# Patient Record
Sex: Male | Born: 1940 | Race: Black or African American | Hispanic: No | Marital: Married | Smoking: Former smoker
Health system: Southern US, Community
[De-identification: ages and names within clinical notes are randomized; demographics above are authoritative.]

## PROBLEM LIST (undated history)

## (undated) DIAGNOSIS — K219 Gastro-esophageal reflux disease without esophagitis: Secondary | ICD-10-CM

## (undated) DIAGNOSIS — E119 Type 2 diabetes mellitus without complications: Secondary | ICD-10-CM

## (undated) DIAGNOSIS — D649 Anemia, unspecified: Secondary | ICD-10-CM

## (undated) DIAGNOSIS — I8290 Acute embolism and thrombosis of unspecified vein: Secondary | ICD-10-CM

## (undated) DIAGNOSIS — I10 Essential (primary) hypertension: Secondary | ICD-10-CM

## (undated) DIAGNOSIS — J189 Pneumonia, unspecified organism: Secondary | ICD-10-CM

## (undated) DIAGNOSIS — C61 Malignant neoplasm of prostate: Secondary | ICD-10-CM

## (undated) DIAGNOSIS — E785 Hyperlipidemia, unspecified: Secondary | ICD-10-CM

## (undated) DIAGNOSIS — N189 Chronic kidney disease, unspecified: Secondary | ICD-10-CM

## (undated) HISTORY — DX: Hyperlipidemia, unspecified: E78.5

## (undated) HISTORY — PX: OTHER SURGICAL HISTORY: SHX169

## (undated) HISTORY — DX: Essential (primary) hypertension: I10

## (undated) HISTORY — DX: Malignant neoplasm of prostate: C61

---

## 1982-04-09 HISTORY — PX: ESOPHAGEAL MANOMETRY: SHX1526

## 1999-06-13 ENCOUNTER — Other Ambulatory Visit: Admission: RE | Admit: 1999-06-13 | Discharge: 1999-06-13 | Payer: Self-pay | Admitting: Urology

## 1999-06-27 ENCOUNTER — Encounter: Admission: RE | Admit: 1999-06-27 | Discharge: 1999-09-25 | Payer: Self-pay | Admitting: Radiation Oncology

## 2001-05-05 ENCOUNTER — Ambulatory Visit: Admission: RE | Admit: 2001-05-05 | Discharge: 2001-08-03 | Payer: Self-pay | Admitting: Radiation Oncology

## 2001-11-03 ENCOUNTER — Ambulatory Visit: Admission: RE | Admit: 2001-11-03 | Discharge: 2001-11-07 | Payer: Self-pay | Admitting: Radiation Oncology

## 2002-05-11 ENCOUNTER — Ambulatory Visit: Admission: RE | Admit: 2002-05-11 | Discharge: 2002-05-11 | Payer: Self-pay | Admitting: Radiation Oncology

## 2002-11-09 ENCOUNTER — Ambulatory Visit: Admission: RE | Admit: 2002-11-09 | Discharge: 2002-11-09 | Payer: Self-pay | Admitting: Radiation Oncology

## 2003-03-19 ENCOUNTER — Encounter: Admission: RE | Admit: 2003-03-19 | Discharge: 2003-03-19 | Payer: Self-pay | Admitting: Internal Medicine

## 2003-05-17 ENCOUNTER — Ambulatory Visit: Admission: RE | Admit: 2003-05-17 | Discharge: 2003-05-17 | Payer: Self-pay | Admitting: Radiation Oncology

## 2003-11-15 ENCOUNTER — Ambulatory Visit: Admission: RE | Admit: 2003-11-15 | Discharge: 2003-11-15 | Payer: Self-pay | Admitting: Radiation Oncology

## 2003-11-16 ENCOUNTER — Ambulatory Visit: Admission: RE | Admit: 2003-11-16 | Discharge: 2003-11-16 | Payer: Self-pay | Admitting: Radiation Oncology

## 2004-03-01 ENCOUNTER — Ambulatory Visit: Payer: Self-pay | Admitting: Internal Medicine

## 2004-03-09 ENCOUNTER — Ambulatory Visit: Payer: Self-pay | Admitting: Internal Medicine

## 2004-04-09 DIAGNOSIS — C61 Malignant neoplasm of prostate: Secondary | ICD-10-CM

## 2004-04-09 HISTORY — DX: Malignant neoplasm of prostate: C61

## 2004-07-07 ENCOUNTER — Ambulatory Visit: Payer: Self-pay | Admitting: Internal Medicine

## 2004-08-01 ENCOUNTER — Ambulatory Visit: Payer: Self-pay | Admitting: Internal Medicine

## 2004-10-27 ENCOUNTER — Ambulatory Visit: Payer: Self-pay | Admitting: Internal Medicine

## 2004-11-17 ENCOUNTER — Ambulatory Visit: Payer: Self-pay | Admitting: Internal Medicine

## 2005-01-04 ENCOUNTER — Ambulatory Visit (HOSPITAL_COMMUNITY): Admission: RE | Admit: 2005-01-04 | Discharge: 2005-01-04 | Payer: Self-pay | Admitting: Urology

## 2005-02-01 ENCOUNTER — Encounter (INDEPENDENT_AMBULATORY_CARE_PROVIDER_SITE_OTHER): Payer: Self-pay | Admitting: *Deleted

## 2005-02-01 ENCOUNTER — Ambulatory Visit (HOSPITAL_BASED_OUTPATIENT_CLINIC_OR_DEPARTMENT_OTHER): Admission: RE | Admit: 2005-02-01 | Discharge: 2005-02-01 | Payer: Self-pay | Admitting: Urology

## 2005-02-01 ENCOUNTER — Ambulatory Visit (HOSPITAL_COMMUNITY): Admission: RE | Admit: 2005-02-01 | Discharge: 2005-02-01 | Payer: Self-pay | Admitting: Urology

## 2005-02-09 ENCOUNTER — Ambulatory Visit: Payer: Self-pay | Admitting: Internal Medicine

## 2005-02-13 ENCOUNTER — Ambulatory Visit: Payer: Self-pay | Admitting: Internal Medicine

## 2005-04-27 ENCOUNTER — Ambulatory Visit (HOSPITAL_COMMUNITY): Admission: RE | Admit: 2005-04-27 | Discharge: 2005-04-27 | Payer: Self-pay | Admitting: Urology

## 2005-06-22 ENCOUNTER — Ambulatory Visit: Payer: Self-pay | Admitting: Internal Medicine

## 2005-06-29 ENCOUNTER — Ambulatory Visit: Payer: Self-pay | Admitting: Internal Medicine

## 2005-07-20 ENCOUNTER — Ambulatory Visit: Payer: Self-pay | Admitting: Internal Medicine

## 2005-09-17 ENCOUNTER — Ambulatory Visit: Payer: Self-pay | Admitting: Internal Medicine

## 2005-09-24 ENCOUNTER — Ambulatory Visit: Payer: Self-pay | Admitting: Internal Medicine

## 2005-12-13 ENCOUNTER — Ambulatory Visit: Payer: Self-pay | Admitting: Internal Medicine

## 2005-12-17 ENCOUNTER — Ambulatory Visit: Payer: Self-pay | Admitting: Internal Medicine

## 2006-04-09 DIAGNOSIS — E119 Type 2 diabetes mellitus without complications: Secondary | ICD-10-CM

## 2006-04-09 DIAGNOSIS — E785 Hyperlipidemia, unspecified: Secondary | ICD-10-CM

## 2006-04-09 DIAGNOSIS — I10 Essential (primary) hypertension: Secondary | ICD-10-CM

## 2006-04-09 DIAGNOSIS — I8290 Acute embolism and thrombosis of unspecified vein: Secondary | ICD-10-CM

## 2006-04-09 HISTORY — DX: Type 2 diabetes mellitus without complications: E11.9

## 2006-04-09 HISTORY — DX: Acute embolism and thrombosis of unspecified vein: I82.90

## 2006-04-09 HISTORY — DX: Essential (primary) hypertension: I10

## 2006-04-09 HISTORY — DX: Hyperlipidemia, unspecified: E78.5

## 2006-04-15 ENCOUNTER — Ambulatory Visit: Payer: Self-pay | Admitting: Internal Medicine

## 2006-04-15 LAB — CONVERTED CEMR LAB
ALT: 13 units/L (ref 0–40)
AST: 20 units/L (ref 0–37)
Albumin: 3.5 g/dL (ref 3.5–5.2)
Alkaline Phosphatase: 117 units/L (ref 39–117)
BUN: 13 mg/dL (ref 6–23)
Bilirubin, Direct: 0.1 mg/dL (ref 0.0–0.3)
CO2: 28 meq/L (ref 19–32)
Calcium: 9.3 mg/dL (ref 8.4–10.5)
Chloride: 109 meq/L (ref 96–112)
Chol/HDL Ratio, serum: 2.6
Cholesterol: 136 mg/dL (ref 0–200)
Creatinine, Ser: 2 mg/dL — ABNORMAL HIGH (ref 0.4–1.5)
GFR calc non Af Amer: 36 mL/min
Glomerular Filtration Rate, Af Am: 43 mL/min/{1.73_m2}
Glucose, Bld: 147 mg/dL — ABNORMAL HIGH (ref 70–99)
HDL: 52.4 mg/dL (ref >39.0)
Hgb A1c MFr Bld: 7.4 % — ABNORMAL HIGH (ref 4.6–6.0)
LDL Cholesterol: 71 mg/dL (ref 0–99)
Potassium: 4.2 meq/L (ref 3.5–5.1)
Sodium: 143 meq/L (ref 135–145)
Total Bilirubin: 0.7 mg/dL (ref 0.3–1.2)
Total Protein: 6.7 g/dL (ref 6.0–8.3)
Triglyceride fasting, serum: 62 mg/dL (ref 0–149)
VLDL: 12 mg/dL (ref 0–40)

## 2006-04-22 ENCOUNTER — Ambulatory Visit: Payer: Self-pay | Admitting: Internal Medicine

## 2006-08-26 ENCOUNTER — Ambulatory Visit: Payer: Self-pay | Admitting: Internal Medicine

## 2006-08-26 LAB — CONVERTED CEMR LAB
ALT: 11 units/L (ref 0–40)
AST: 22 units/L (ref 0–37)
Albumin: 3.5 g/dL (ref 3.5–5.2)
Alkaline Phosphatase: 81 units/L (ref 39–117)
BUN: 12 mg/dL (ref 6–23)
Bilirubin, Direct: 0.1 mg/dL (ref 0.0–0.3)
CO2: 29 meq/L (ref 19–32)
Calcium: 9.4 mg/dL (ref 8.4–10.5)
Chloride: 114 meq/L — ABNORMAL HIGH (ref 96–112)
Cholesterol: 150 mg/dL (ref 0–200)
Creatinine, Ser: 1.9 mg/dL — ABNORMAL HIGH (ref 0.4–1.5)
GFR calc Af Amer: 46 mL/min
GFR calc non Af Amer: 38 mL/min
Glucose, Bld: 101 mg/dL — ABNORMAL HIGH (ref 70–99)
HDL: 52.6 mg/dL (ref >39.0)
Hgb A1c MFr Bld: 7.7 % — ABNORMAL HIGH (ref 4.6–6.0)
LDL Cholesterol: 79 mg/dL (ref 0–99)
Potassium: 4.3 meq/L (ref 3.5–5.1)
Sodium: 149 meq/L — ABNORMAL HIGH (ref 135–145)
Total Bilirubin: 0.4 mg/dL (ref 0.3–1.2)
Total CHOL/HDL Ratio: 2.9
Total Protein: 6.7 g/dL (ref 6.0–8.3)
Triglycerides: 92 mg/dL (ref 0–149)
VLDL: 18 mg/dL (ref 0–40)

## 2006-09-09 ENCOUNTER — Ambulatory Visit: Payer: Self-pay | Admitting: Internal Medicine

## 2006-09-13 DIAGNOSIS — E785 Hyperlipidemia, unspecified: Secondary | ICD-10-CM

## 2006-09-13 DIAGNOSIS — I10 Essential (primary) hypertension: Secondary | ICD-10-CM

## 2006-09-13 DIAGNOSIS — E1169 Type 2 diabetes mellitus with other specified complication: Secondary | ICD-10-CM

## 2006-09-13 DIAGNOSIS — C61 Malignant neoplasm of prostate: Secondary | ICD-10-CM | POA: Insufficient documentation

## 2006-09-15 DIAGNOSIS — E1129 Type 2 diabetes mellitus with other diabetic kidney complication: Secondary | ICD-10-CM | POA: Insufficient documentation

## 2007-01-09 ENCOUNTER — Ambulatory Visit: Payer: Self-pay | Admitting: Internal Medicine

## 2007-01-09 LAB — CONVERTED CEMR LAB
ALT: 13 units/L (ref 0–53)
AST: 24 units/L (ref 0–37)
Albumin: 3.4 g/dL — ABNORMAL LOW (ref 3.5–5.2)
Alkaline Phosphatase: 109 units/L (ref 39–117)
BUN: 8 mg/dL (ref 6–23)
Bilirubin, Direct: 0.2 mg/dL (ref 0.0–0.3)
CO2: 32 meq/L (ref 19–32)
Calcium: 9 mg/dL (ref 8.4–10.5)
Chloride: 104 meq/L (ref 96–112)
Cholesterol: 146 mg/dL (ref 0–200)
Creatinine, Ser: 1.8 mg/dL — ABNORMAL HIGH (ref 0.4–1.5)
GFR calc Af Amer: 49 mL/min
GFR calc non Af Amer: 40 mL/min
Glucose, Bld: 108 mg/dL — ABNORMAL HIGH (ref 70–99)
HDL: 44.5 mg/dL (ref >39.0)
Hgb A1c MFr Bld: 7.7 % — ABNORMAL HIGH (ref 4.6–6.0)
LDL Cholesterol: 82 mg/dL (ref 0–99)
Potassium: 3.8 meq/L (ref 3.5–5.1)
Sodium: 143 meq/L (ref 135–145)
Total Bilirubin: 0.6 mg/dL (ref 0.3–1.2)
Total CHOL/HDL Ratio: 3.3
Total Protein: 6.4 g/dL (ref 6.0–8.3)
Triglycerides: 96 mg/dL (ref 0–149)
VLDL: 19 mg/dL (ref 0–40)

## 2007-01-17 ENCOUNTER — Ambulatory Visit: Payer: Self-pay | Admitting: Internal Medicine

## 2007-01-17 LAB — CONVERTED CEMR LAB
Cholesterol, target level: 200 mg/dL
HDL goal, serum: 40 mg/dL
LDL Goal: 100 mg/dL

## 2007-02-05 ENCOUNTER — Encounter (HOSPITAL_COMMUNITY): Admission: RE | Admit: 2007-02-05 | Discharge: 2007-04-07 | Payer: Self-pay | Admitting: Urology

## 2007-04-25 ENCOUNTER — Encounter: Payer: Self-pay | Admitting: Internal Medicine

## 2007-05-21 ENCOUNTER — Ambulatory Visit: Payer: Self-pay | Admitting: Internal Medicine

## 2007-05-28 LAB — CONVERTED CEMR LAB
Alkaline Phosphatase: 97 units/L (ref 39–117)
CO2: 29 meq/L (ref 19–32)
Chloride: 101 meq/L (ref 96–112)
GFR calc Af Amer: 43 mL/min
GFR calc non Af Amer: 36 mL/min
Glucose, Bld: 246 mg/dL — ABNORMAL HIGH (ref 70–99)
HDL: 46.4 mg/dL (ref >39.0)
LDL Cholesterol: 81 mg/dL (ref 0–99)
Potassium: 3.6 meq/L (ref 3.5–5.1)
Sodium: 141 meq/L (ref 135–145)
Triglycerides: 89 mg/dL (ref 0–149)
VLDL: 18 mg/dL (ref 0–40)

## 2007-09-18 ENCOUNTER — Ambulatory Visit: Payer: Self-pay | Admitting: Internal Medicine

## 2007-09-22 LAB — CONVERTED CEMR LAB
ALT: 9 units/L (ref 0–53)
CO2: 30 meq/L (ref 19–32)
Calcium: 10.1 mg/dL (ref 8.4–10.5)
Chloride: 103 meq/L (ref 96–112)
Cholesterol: 136 mg/dL (ref 0–200)
GFR calc non Af Amer: 43 mL/min
HDL: 50.1 mg/dL (ref >39.0)
Potassium: 3.1 meq/L — ABNORMAL LOW (ref 3.5–5.1)
Sodium: 142 meq/L (ref 135–145)
Total CHOL/HDL Ratio: 2.7

## 2007-11-11 ENCOUNTER — Emergency Department (HOSPITAL_BASED_OUTPATIENT_CLINIC_OR_DEPARTMENT_OTHER): Admission: EM | Admit: 2007-11-11 | Discharge: 2007-11-11 | Payer: Self-pay | Admitting: Emergency Medicine

## 2008-01-19 ENCOUNTER — Ambulatory Visit: Payer: Self-pay | Admitting: Internal Medicine

## 2008-01-20 LAB — CONVERTED CEMR LAB
ALT: 12 units/L (ref 0–53)
AST: 21 units/L (ref 0–37)
BUN: 22 mg/dL (ref 6–23)
Chloride: 106 meq/L (ref 96–112)
Cholesterol: 158 mg/dL (ref 0–200)
Creatinine, Ser: 2 mg/dL — ABNORMAL HIGH (ref 0.4–1.5)
GFR calc Af Amer: 43 mL/min
GFR calc non Af Amer: 36 mL/min
Glucose, Bld: 128 mg/dL — ABNORMAL HIGH (ref 70–99)
HDL: 52.1 mg/dL (ref >39.0)
Triglycerides: 124 mg/dL (ref 0–149)

## 2008-02-04 ENCOUNTER — Encounter: Payer: Self-pay | Admitting: Internal Medicine

## 2008-03-02 ENCOUNTER — Ambulatory Visit: Payer: Self-pay | Admitting: Internal Medicine

## 2008-03-09 HISTORY — PX: COLONOSCOPY W/ POLYPECTOMY: SHX1380

## 2008-04-06 ENCOUNTER — Encounter: Payer: Self-pay | Admitting: Internal Medicine

## 2008-04-06 ENCOUNTER — Ambulatory Visit: Payer: Self-pay | Admitting: Internal Medicine

## 2008-04-08 ENCOUNTER — Encounter: Payer: Self-pay | Admitting: Internal Medicine

## 2008-05-21 ENCOUNTER — Ambulatory Visit: Payer: Self-pay | Admitting: Internal Medicine

## 2008-05-24 LAB — CONVERTED CEMR LAB
ALT: 8 units/L (ref 0–53)
AST: 20 units/L (ref 0–37)
BUN: 12 mg/dL (ref 6–23)
CO2: 27 meq/L (ref 19–32)
Calcium: 10 mg/dL (ref 8.4–10.5)
Chloride: 102 meq/L (ref 96–112)
Cholesterol: 191 mg/dL (ref 0–200)
Creatinine, Ser: 1.7 mg/dL — ABNORMAL HIGH (ref 0.4–1.5)
GFR calc non Af Amer: 43 mL/min
HDL: 50.2 mg/dL (ref >39.0)
Potassium: 2.8 meq/L — ABNORMAL LOW (ref 3.5–5.1)
Sodium: 141 meq/L (ref 135–145)

## 2008-06-08 ENCOUNTER — Encounter: Payer: Self-pay | Admitting: Internal Medicine

## 2008-09-28 ENCOUNTER — Ambulatory Visit: Payer: Self-pay | Admitting: Internal Medicine

## 2008-09-28 LAB — CONVERTED CEMR LAB
ALT: 11 units/L (ref 0–53)
AST: 23 units/L (ref 0–37)
Albumin: 3.1 g/dL — ABNORMAL LOW (ref 3.5–5.2)
BUN: 11 mg/dL (ref 6–23)
Chloride: 114 meq/L — ABNORMAL HIGH (ref 96–112)
Creatinine, Ser: 2.1 mg/dL — ABNORMAL HIGH (ref 0.4–1.5)
Glucose, Bld: 68 mg/dL — ABNORMAL LOW (ref 70–99)
Sodium: 144 meq/L (ref 135–145)
Total Protein: 6.2 g/dL (ref 6.0–8.3)
VLDL: 21.8 mg/dL (ref 0.0–40.0)

## 2008-09-29 ENCOUNTER — Encounter: Payer: Self-pay | Admitting: Internal Medicine

## 2008-10-05 ENCOUNTER — Ambulatory Visit: Payer: Self-pay | Admitting: Internal Medicine

## 2009-01-27 ENCOUNTER — Ambulatory Visit: Payer: Self-pay | Admitting: Internal Medicine

## 2009-01-27 LAB — CONVERTED CEMR LAB
ALT: 14 units/L (ref 0–53)
Alkaline Phosphatase: 85 units/L (ref 39–117)
BUN: 16 mg/dL (ref 6–23)
Bilirubin, Direct: 0 mg/dL (ref 0.0–0.3)
Calcium: 9.2 mg/dL (ref 8.4–10.5)
GFR calc non Af Amer: 42.96 mL/min (ref >60)
HDL: 52 mg/dL (ref >39.00)
Sodium: 146 meq/L — ABNORMAL HIGH (ref 135–145)
Total Bilirubin: 0.5 mg/dL (ref 0.3–1.2)
Total CHOL/HDL Ratio: 3
Triglycerides: 92 mg/dL (ref 0.0–149.0)

## 2009-02-03 ENCOUNTER — Ambulatory Visit: Payer: Self-pay | Admitting: Internal Medicine

## 2009-03-07 ENCOUNTER — Telehealth: Payer: Self-pay | Admitting: Internal Medicine

## 2009-03-30 ENCOUNTER — Encounter: Payer: Self-pay | Admitting: Internal Medicine

## 2009-05-27 ENCOUNTER — Ambulatory Visit: Payer: Self-pay | Admitting: Internal Medicine

## 2009-05-27 LAB — CONVERTED CEMR LAB
Alkaline Phosphatase: 94 units/L (ref 39–117)
BUN: 15 mg/dL (ref 6–23)
Calcium: 9.3 mg/dL (ref 8.4–10.5)
Cholesterol: 137 mg/dL (ref 0–200)
Creatinine, Ser: 2.1 mg/dL — ABNORMAL HIGH (ref 0.4–1.5)
HDL: 53.8 mg/dL (ref >39.00)
LDL Cholesterol: 64 mg/dL (ref 0–99)
Potassium: 4.5 meq/L (ref 3.5–5.1)
Sodium: 143 meq/L (ref 135–145)
Total Bilirubin: 0.3 mg/dL (ref 0.3–1.2)
Total Protein: 7 g/dL (ref 6.0–8.3)
Triglycerides: 98 mg/dL (ref 0.0–149.0)

## 2009-06-03 ENCOUNTER — Ambulatory Visit: Payer: Self-pay | Admitting: Internal Medicine

## 2009-07-05 ENCOUNTER — Encounter: Payer: Self-pay | Admitting: Internal Medicine

## 2009-09-23 ENCOUNTER — Ambulatory Visit: Payer: Self-pay | Admitting: Internal Medicine

## 2009-09-23 LAB — CONVERTED CEMR LAB
AST: 20 units/L (ref 0–37)
Albumin: 3.7 g/dL (ref 3.5–5.2)
CO2: 26 meq/L (ref 19–32)
Chloride: 114 meq/L — ABNORMAL HIGH (ref 96–112)
Creatinine, Ser: 2 mg/dL — ABNORMAL HIGH (ref 0.4–1.5)
GFR calc non Af Amer: 43.12 mL/min (ref >60)
Hgb A1c MFr Bld: 7.6 % — ABNORMAL HIGH (ref 4.6–6.5)
Sodium: 145 meq/L (ref 135–145)
Total Protein: 6.6 g/dL (ref 6.0–8.3)
VLDL: 16.6 mg/dL (ref 0.0–40.0)

## 2009-09-30 ENCOUNTER — Ambulatory Visit: Payer: Self-pay | Admitting: Internal Medicine

## 2009-10-05 ENCOUNTER — Encounter: Payer: Self-pay | Admitting: Internal Medicine

## 2010-01-31 ENCOUNTER — Ambulatory Visit: Payer: Self-pay | Admitting: Internal Medicine

## 2010-01-31 LAB — CONVERTED CEMR LAB
Albumin: 3.3 g/dL — ABNORMAL LOW (ref 3.5–5.2)
Alkaline Phosphatase: 94 units/L (ref 39–117)
BUN: 11 mg/dL (ref 6–23)
Bilirubin, Direct: 0 mg/dL (ref 0.0–0.3)
Calcium: 9.3 mg/dL (ref 8.4–10.5)
GFR calc non Af Amer: 44.89 mL/min (ref >60)
Glucose, Bld: 114 mg/dL — ABNORMAL HIGH (ref 70–99)
HDL: 51.9 mg/dL (ref >39.00)
Hgb A1c MFr Bld: 8 % — ABNORMAL HIGH (ref 4.6–6.5)
LDL Cholesterol: 117 mg/dL — ABNORMAL HIGH (ref 0–99)
Potassium: 5 meq/L (ref 3.5–5.1)
Total Bilirubin: 0.4 mg/dL (ref 0.3–1.2)
Triglycerides: 118 mg/dL (ref 0.0–149.0)

## 2010-02-06 ENCOUNTER — Ambulatory Visit: Payer: Self-pay | Admitting: Internal Medicine

## 2010-04-11 ENCOUNTER — Encounter: Payer: Self-pay | Admitting: Internal Medicine

## 2010-05-06 ENCOUNTER — Encounter: Payer: Self-pay | Admitting: Internal Medicine

## 2010-05-09 NOTE — Letter (Signed)
Summary: [facility]  [facility]   Imported By: Maryln Gottron 04/21/2009 12:15:56  _____________________________________________________________________  External Attachment:    Type:   Image     Comment:   External Document

## 2010-05-09 NOTE — Assessment & Plan Note (Signed)
Summary: 4 MNTH ROV//SLM   Vital Signs:  Patient profile:   70 year old male Weight:      188 pounds BMI:     27.86 Temp:     98.4 degrees F oral Pulse rate:   60 / minute Pulse rhythm:   irregular Resp:     12 per minute BP sitting:   144 / 78  (left arm) Cuff size:   regular  Vitals Entered By: Gladis Riffle, RN (September 30, 2009 8:18 AM) CC: 4 month rov, labs done--admits to eating candy Is Patient Diabetic? Yes Did you bring your meter with you today? No Comments does not check CBGs at home   CC:  4 month rov and labs done--admits to eating candy.  History of Present Illness:  Follow-Up Visit      [name] who presents for Follow-up visit.  The patient denies chest pain and palpitations.  Since the last visit the patient notes no new problems or concerns.  The patient reports taking meds as prescribed.  When questioned about possible medication side effects, the patient notes none.    All other systems reviewed and were negative   Preventive Screening-Counseling & Management  Alcohol-Tobacco     Smoking Status: quit     Year Quit: 1995  Current Problems (verified): 1)  Preventive Health Care  (ICD-V70.0) 2)  Diabetes Mellitus, Type II  (ICD-250.00) 3)  Renal Insufficiency  (ICD-588.9) 4)  Hypertension  (ICD-401.9) 5)  Hyperlipidemia  (ICD-272.4) 6)  Prostate Cancer, Hx of  (ICD-V10.46)  Current Medications (verified): 1)  Lovastatin 40 Mg Tabs (Lovastatin) .... One P.o. Daily 2)  Actos 15 Mg  Tabs (Pioglitazone Hcl) .Marland Kitchen.. 1 By Mouth Daily 3)  Glyburide 5 Mg Tabs (Glyburide) .... One By Mouth Daily 4)  Lisinopril-Hydrochlorothiazide 20-25 Mg  Tabs (Lisinopril-Hydrochlorothiazide) .Marland Kitchen.. 1 Once Daily 5)  Oscal 500/200 D-3 500-200 Mg-Unit Tabs (Calcium-Vitamin D) .... Two Times A Day 6)  Klor-Con M20 20 Meq Cr-Tabs (Potassium Chloride Crys Cr) .... Take 1 Tablet By Mouth Once A Day  Allergies (verified): No Known Drug Allergies  Past History:  Past  Medical History: Last updated: 01/19/2008 Prostate cancer, hx of Hyperlipidemia Hypertension Renal insufficiency DM cataract-Gould Diabetes mellitus, type II  Past Surgical History: Last updated: 10/18/2008 prostate CA---radiation therapy ONLY  Family History: Last updated: 18-Oct-2008 father deceased---age 70 mother deceased age 79  Social History: Last updated: 01/17/2007 Married Former Smoker Regular exercise-no  Risk Factors: Exercise: no (01/17/2007)  Risk Factors: Smoking Status: quit (09/30/2009)  Physical Exam  General:  alert and well-developed.   Head:  normocephalic and atraumatic.   Eyes:  pupils equal and pupils round.   Ears:  R ear normal and L ear normal.   Neck:  No deformities, masses, or tenderness noted. Chest Wall:  No deformities, masses, tenderness or gynecomastia noted. Lungs:  Normal respiratory effort, chest expands symmetrically. Lungs are clear to auscultation, no crackles or wheezes. Abdomen:  Bowel sounds positive,abdomen soft and non-tender without masses, organomegaly or hernias noted. overweight Msk:  No deformity or scoliosis noted of thoracic or lumbar spine.   Neurologic:  cranial nerves II-XII intact and gait normal.     Impression & Recommendations:  Problem # 1:  DIABETES MELLITUS, TYPE II (ICD-250.00) given dietary noncompliance his blood work is ok continue current medications  His updated medication list for this problem includes:    Actos 15 Mg Tabs (Pioglitazone hcl) .Marland Kitchen... 1 by mouth daily  Glyburide 5 Mg Tabs (Glyburide) ..... One by mouth daily    Lisinopril-hydrochlorothiazide 20-12.5 Mg Tabs (Lisinopril-hydrochlorothiazide) .Marland Kitchen... 2 by mouth once daily  Labs Reviewed: Creat: 2.0 (09/23/2009)     Last Eye Exam: normal (04/09/2009) Reviewed HgBA1c results: 7.6 (09/23/2009)  7.7 (05/27/2009)  Problem # 2:  HYPERTENSION (ICD-401.9) will change lisinopril His updated medication list for this problem  includes:    Lisinopril-hydrochlorothiazide 20-12.5 Mg Tabs (Lisinopril-hydrochlorothiazide) .Marland Kitchen... 2 by mouth once daily  BP today: 144/78 Prior BP: 160/98 (06/03/2009)  Prior 10 Yr Risk Heart Disease: 14 % (01/17/2007)  Labs Reviewed: K+: 4.8 (09/23/2009) Creat: : 2.0 (09/23/2009)   Chol: 161 (09/23/2009)   HDL: 53.00 (09/23/2009)   LDL: 91 (09/23/2009)   TG: 83.0 (09/23/2009)  Problem # 3:  HYPERLIPIDEMIA (ICD-272.4) well controlled continue current medications  His updated medication list for this problem includes:    Lovastatin 40 Mg Tabs (Lovastatin) ..... One p.o. daily  Labs Reviewed: SGOT: 20 (09/23/2009)   SGPT: 11 (09/23/2009)  Lipid Goals: Chol Goal: 200 (01/17/2007)   HDL Goal: 40 (01/17/2007)   LDL Goal: 100 (01/17/2007)   TG Goal: 150 (01/17/2007)  Prior 10 Yr Risk Heart Disease: 14 % (01/17/2007)   HDL:53.00 (09/23/2009), 53.80 (05/27/2009)  LDL:91 (09/23/2009), 64 (05/27/2009)  Chol:161 (09/23/2009), 137 (05/27/2009)  Trig:83.0 (09/23/2009), 98.0 (05/27/2009)  Complete Medication List: 1)  Lovastatin 40 Mg Tabs (Lovastatin) .... One p.o. daily 2)  Actos 15 Mg Tabs (Pioglitazone hcl) .Marland Kitchen.. 1 by mouth daily 3)  Glyburide 5 Mg Tabs (Glyburide) .... One by mouth daily 4)  Lisinopril-hydrochlorothiazide 20-12.5 Mg Tabs (Lisinopril-hydrochlorothiazide) .... 2 by mouth once daily 5)  Oscal 500/200 D-3 500-200 Mg-unit Tabs (Calcium-vitamin d) .... Two times a day 6)  Klor-con M20 20 Meq Cr-tabs (Potassium chloride crys cr) .... Take 1 tablet by mouth once a day  Patient Instructions: 1)  Please schedule a follow-up appointment in 4 months. 2)  labs one week prior to visit 3)  lipids---272.4 4)  lfts-995.2 5)  bmet-995.2 6)  A1C-250.02 7)     Prescriptions: LISINOPRIL-HYDROCHLOROTHIAZIDE 20-12.5 MG TABS (LISINOPRIL-HYDROCHLOROTHIAZIDE) 2 by mouth once daily  #180 x 3   Entered and Authorized by:   Birdie Sons MD   Signed by:   Birdie Sons MD on 09/30/2009    Method used:   [facility]        [facility]  [address] [phone]* (retail)       [address]       [place], [place]  [id number]       Ph: [phone] or [phone]       Fax: [phone]   RxID:   [id number]

## 2010-05-09 NOTE — Letter (Signed)
Summary: [facility]  [facility]   Imported By: Maryln Gottron 07/19/2009 13:52:40  _____________________________________________________________________  External Attachment:    Type:   Image     Comment:   External Document

## 2010-05-09 NOTE — Letter (Signed)
Summary: [facility]  [facility]   Imported By: Maryln Gottron 10/13/2009 10:54:17  _____________________________________________________________________  External Attachment:    Type:   Image     Comment:   External Document

## 2010-05-09 NOTE — Assessment & Plan Note (Signed)
Summary: ROA X 4 MTHS / RS   Vital Signs:  Patient profile:   70 year old male Weight:      197 pounds Temp:     98.7 degrees F oral Pulse rate:   90 / minute Pulse rhythm:   regular BP sitting:   160 / 98  (left arm)  Vitals Entered By: Kern Reap CMA Duncan Dull) (June 03, 2009 8:05 AM)  Serial Vital Signs/Assessments:  Time      Position  BP       Pulse  Resp  Temp     By                     138/70                         Birdie Sons MD   CC: follow up labs Is Patient Diabetic? Yes Did you bring your meter with you today? No   CC:  follow up labs.  History of Present Illness:  Follow-Up Visit      [name] who presents for Follow-up visit.  The patient denies chest pain and palpitations.  Since the last visit the patient notes no new problems or concerns.  The patient reports taking meds as prescribed, not monitoring BP, not monitoring blood sugars, and dietary noncompliance.  When questioned about possible medication side effects, the patient notes none.    All other systems reviewed and were negative   Current Problems (verified): 1)  Special Screening For Malignant Neoplasms Colon  (ICD-V76.51) 2)  Preventive Health Care  (ICD-V70.0) 3)  Diabetes Mellitus, Type II  (ICD-250.00) 4)  Renal Insufficiency  (ICD-588.9) 5)  Hypertension  (ICD-401.9) 6)  Hyperlipidemia  (ICD-272.4) 7)  Prostate Cancer, Hx of  (ICD-V10.46)  Current Medications (verified): 1)  Lovastatin 40 Mg Tabs (Lovastatin) .... One P.o. Daily 2)  Actos 30 Mg Tabs (Pioglitazone Hcl) .... Take 1/2 Tablet By Mouth Once A Day 3)  Glyburide 5 Mg Tabs (Glyburide) .... One By Mouth Daily 4)  Lisinopril-Hydrochlorothiazide 20-25 Mg  Tabs (Lisinopril-Hydrochlorothiazide) .Marland Kitchen.. 1 Once Daily 5)  Oscal 500/200 D-3 500-200 Mg-Unit Tabs (Calcium-Vitamin D) .... Two Times A Day 6)  Potassium Chloride 20 Meq  Pack (Potassium Chloride) .... One By Mouth Once Daily  Allergies (verified): No Known  Drug Allergies  Past History:  Past Medical History: Last updated: 01/19/2008 Prostate cancer, hx of Hyperlipidemia Hypertension Renal insufficiency DM cataract-Gould Diabetes mellitus, type II  Past Surgical History: Last updated: October 26, 2008 prostate CA---radiation therapy ONLY  Family History: Last updated: 10-26-2008 father deceased---age 70 mother deceased age 49  Social History: Last updated: 01/17/2007 Married Former Smoker Regular exercise-no  Risk Factors: Exercise: no (01/17/2007)  Risk Factors: Smoking Status: quit (01/17/2007)  Review of Systems       All other systems reviewed and were negative   Physical Exam  General:  Well-developed,well-nourished,in no acute distress; alert,appropriate and cooperative throughout examination Head:  normocephalic and atraumatic.   Ears:  R ear normal and L ear normal.   Neck:  No deformities, masses, or tenderness noted. Chest Wall:  No deformities, masses, tenderness or gynecomastia noted. Lungs:  Normal respiratory effort, chest expands symmetrically. Lungs are clear to auscultation, no crackles or wheezes. Heart:  normal rate, regular rhythm, and no gallop.  1-2/6 HSM Abdomen:  Bowel sounds positive,abdomen soft and non-tender without masses, organomegaly or hernias noted. overweight Msk:  No deformity  or scoliosis noted of thoracic or lumbar spine.   Pulses:  R radial normal and L radial normal.   Neurologic:  cranial nerves II-XII intact and gait normal.    Diabetes Management Exam:    Eye Exam:       Eye Exam done elsewhere          Date: 04/09/2009          Results: normal          Done by: ophthalm   Impression & Recommendations:  Problem # 1:  DIABETES MELLITUS, TYPE II (ICD-250.00) complete dietary non compliance advised no concentrated sweets---he refuses to change his dietary habits His updated medication list for this problem includes:    Actos 15 Mg Tabs (Pioglitazone hcl) .Marland Kitchen... 1 by  mouth daily    Glyburide 5 Mg Tabs (Glyburide) ..... One by mouth daily    Lisinopril-hydrochlorothiazide 20-25 Mg Tabs (Lisinopril-hydrochlorothiazide) .Marland Kitchen... 1 once daily  Labs Reviewed: Creat: 2.1 (05/27/2009)     Last Eye Exam: normal (04/09/2009) Reviewed HgBA1c results: 7.7 (05/27/2009)  7.0 (01/27/2009)  Problem # 2:  RENAL INSUFFICIENCY (ICD-588.9)  Problem # 3:  HYPERTENSION (ICD-401.9) fair control see serial assessment His updated medication list for this problem includes:    Lisinopril-hydrochlorothiazide 20-25 Mg Tabs (Lisinopril-hydrochlorothiazide) .Marland Kitchen... 1 once daily  BP today: 160/98 Prior BP: 138/84 (02/03/2009)  Prior 10 Yr Risk Heart Disease: 14 % (01/17/2007)  Labs Reviewed: K+: 4.5 (05/27/2009) Creat: : 2.1 (05/27/2009)   Chol: 137 (05/27/2009)   HDL: 53.80 (05/27/2009)   LDL: 64 (05/27/2009)   TG: 98.0 (05/27/2009)  Problem # 4:  HYPERLIPIDEMIA (ICD-272.4) controlled continue current medications  His updated medication list for this problem includes:    Lovastatin 40 Mg Tabs (Lovastatin) ..... One p.o. daily  Labs Reviewed: SGOT: 19 (05/27/2009)   SGPT: 11 (05/27/2009)  Lipid Goals: Chol Goal: 200 (01/17/2007)   HDL Goal: 40 (01/17/2007)   LDL Goal: 100 (01/17/2007)   TG Goal: 150 (01/17/2007)  Prior 10 Yr Risk Heart Disease: 14 % (01/17/2007)   HDL:53.80 (05/27/2009), 52.00 (01/27/2009)  LDL:64 (05/27/2009), 97 (01/27/2009)  Chol:137 (05/27/2009), 167 (01/27/2009)  Trig:98.0 (05/27/2009), 92.0 (01/27/2009)  Complete Medication List: 1)  Lovastatin 40 Mg Tabs (Lovastatin) .... One p.o. daily 2)  Actos 15 Mg Tabs (Pioglitazone hcl) .Marland Kitchen.. 1 by mouth daily 3)  Glyburide 5 Mg Tabs (Glyburide) .... One by mouth daily 4)  Lisinopril-hydrochlorothiazide 20-25 Mg Tabs (Lisinopril-hydrochlorothiazide) .Marland Kitchen.. 1 once daily 5)  Oscal 500/200 D-3 500-200 Mg-unit Tabs (Calcium-vitamin d) .... Two times a day 6)  Potassium Chloride 20 Meq Pack (Potassium chloride)  .... One by mouth once daily  Patient Instructions: 1)  See your eye doctor yearly to check for diabetic eye damage. 2)  Please schedule a follow-up appointment in 4 months. 3)  labs one week prior to visit 4)  lipids---272.4 5)  lfts-995.2 6)  bmet-995.2 7)  A1C-250.02 8)     9)  It is important that you exercise regularly at least 20 minutes 5 times a week. If you develop chest pain, have severe difficulty breathing, or feel very tired , stop exercising immediately and seek medical attention. 10)  Check your blood sugars regularly. If your readings are usually above : or below 70 you should contact our office. 11)  It is important that your Diabetic A1c level is checked every 3 months. Prescriptions: LISINOPRIL-HYDROCHLOROTHIAZIDE 20-25 MG  TABS (LISINOPRIL-HYDROCHLOROTHIAZIDE) 1 once daily  #90 x 3   Entered and Authorized by:  Birdie Sons MD   Signed by:   Birdie Sons MD on 06/03/2009   Method used:   Faxed to ...       [facility]       [address] [place], [place]  [address]       Ph: [phone]       Fax: [phone]   RxID:   [id number] GLYBURIDE 5 MG TABS (GLYBURIDE) one by mouth daily  #90 x 3   Entered and Authorized by:   Birdie Sons MD   Signed by:   Birdie Sons MD on 06/03/2009   Method used:   Faxed to ...       [facility] ([facility]order)       [address] [place], [place]  [address]       Ph: [phone]       Fax: [phone]   RxID:   [id number] LOVASTATIN 40 MG TABS (LOVASTATIN) one p.o. daily  #90 x 3   Entered and Authorized by:   Birdie Sons MD   Signed by:   Birdie Sons MD on 06/03/2009   Method used:   Faxed to ...       [facility] ([facility]       [address] [place], [place]  [address]       Ph: [phone]       Fax: [phone]   RxID:   [id number] ACTOS 15 MG  TABS (PIOGLITAZONE HCL) 1 by mouth daily  #90 x 3   Entered and Authorized by:   Birdie Sons MD   Signed by:   Birdie Sons MD on  06/03/2009   Method used:   Faxed to ...       [facility] ([facility])       [address] [place], [place]  [address]       Ph: [phone]       Fax: [phone]   RxID:   [id number]

## 2010-05-09 NOTE — Assessment & Plan Note (Signed)
Summary: 4 month rov/njr   Vital Signs:  Patient profile:   70 year old male Weight:      190 pounds Temp:     98.6 degrees F oral Pulse rate:   78 / minute Pulse rhythm:   regular Resp:     12 per minute BP sitting:   164 / 80  Vitals Entered By: Lynann Beaver CMA AAMA (February 06, 2010 8:42 AM)  Serial Vital Signs/Assessments:  Time      Position  BP       Pulse  Resp  Temp     By                     130/85                         Birdie Sons MD  CC: rov Is Patient Diabetic? No Pain Assessment Patient in pain? no        CC:  rov.  History of Present Illness:  Follow-Up Visit      [name] who presents for Follow-up visit.  The patient denies chest pain and palpitations.  Since the last visit the patient notes no new problems or concerns.  When questioned about possible medication side effects, the patient notes not following a diet . "I eat whatever I want"! no exercise  All other systems reviewed and were negative   Current Problems (verified): 1)  Preventive Health Care  (ICD-V70.0) 2)  Diabetes Mellitus, Type II  (ICD-250.00) 3)  Renal Insufficiency  (ICD-588.9) 4)  Hypertension  (ICD-401.9) 5)  Hyperlipidemia  (ICD-272.4) 6)  Prostate Cancer, Hx of  (ICD-V10.46)  Current Medications (verified): 1)  Lovastatin 40 Mg Tabs (Lovastatin) .... One P.o. Daily 2)  Actos 15 Mg  Tabs (Pioglitazone Hcl) .Marland Kitchen.. 1 By Mouth Daily 3)  Glyburide 5 Mg Tabs (Glyburide) .... One By Mouth Daily 4)  Lisinopril-Hydrochlorothiazide 20-12.5 Mg Tabs (Lisinopril-Hydrochlorothiazide) .... 2 By Mouth Once Daily 5)  Oscal 500/200 D-3 500-200 Mg-Unit Tabs (Calcium-Vitamin D) .... Two Times A Day  Allergies (verified): No Known Drug Allergies  Past History:  Past Medical History: Last updated: 01/19/2008 Prostate cancer, hx of Hyperlipidemia Hypertension Renal insufficiency DM cataract-Gould Diabetes mellitus, type II  Past Surgical History: Last updated:  Oct 09, 2008 prostate CA---radiation therapy ONLY  Family History: Last updated: Oct 09, 2008 father deceased---age 95 mother deceased age 19  Social History: Last updated: 01/17/2007 Married Former Smoker Regular exercise-no  Risk Factors: Exercise: no (01/17/2007)  Risk Factors: Smoking Status: quit (09/30/2009)  Physical Exam  General:  alert and well-developed.   Head:  normocephalic and atraumatic.   Eyes:  pupils equal and pupils round.   Ears:  R ear normal and L ear normal.   Neck:  No deformities, masses, or tenderness noted. Chest Wall:  No deformities, masses, tenderness or gynecomastia noted. Lungs:  Normal respiratory effort, chest expands symmetrically. Lungs are clear to auscultation, no crackles or wheezes. Abdomen:  soft and non-tender.   Msk:  No deformity or scoliosis noted of thoracic or lumbar spine.   Neurologic:  cranial nerves II-XII intact and strength normal in all extremities.     Impression & Recommendations:  Problem # 1:  DIABETES MELLITUS, TYPE II (ICD-250.00) not as well controlled likely related to diet and inactivity encouraged increased exercise His updated medication list for this problem includes:    Actos 15 Mg Tabs (Pioglitazone hcl) .Marland KitchenMarland KitchenMarland KitchenMarland Kitchen 1  by mouth daily    Glyburide 5 Mg Tabs (Glyburide) ..... One by mouth daily    Lisinopril-hydrochlorothiazide 20-12.5 Mg Tabs (Lisinopril-hydrochlorothiazide) .Marland Kitchen... 2 by mouth once daily  Labs Reviewed: Creat: 1.9 (01/31/2010)     Last Eye Exam: normal (04/09/2009) Reviewed HgBA1c results: 8.0 (01/31/2010)  7.6 (09/23/2009)  Problem # 2:  HYPERTENSION (ICD-401.9) se serial assessment His updated medication list for this problem includes:    Lisinopril-hydrochlorothiazide 20-12.5 Mg Tabs (Lisinopril-hydrochlorothiazide) .Marland Kitchen... 2 by mouth once daily  BP today: 164/80 Prior BP: 144/78 (09/30/2009)  Prior 10 Yr Risk Heart Disease: 14 % (01/17/2007)  Labs Reviewed: K+: 5.0  (01/31/2010) Creat: : 1.9 (01/31/2010)   Chol: 192 (01/31/2010)   HDL: 51.90 (01/31/2010)   LDL: 117 (01/31/2010)   TG: 118.0 (01/31/2010)  Problem # 3:  RENAL INSUFFICIENCY (ICD-588.9)  Problem # 4:  HYPERLIPIDEMIA (ICD-272.4) controlled continue current medications  His updated medication list for this problem includes:    Lovastatin 40 Mg Tabs (Lovastatin) ..... One p.o. daily  Labs Reviewed: SGOT: 20 (01/31/2010)   SGPT: 10 (01/31/2010)  Lipid Goals: Chol Goal: 200 (01/17/2007)   HDL Goal: 40 (01/17/2007)   LDL Goal: 100 (01/17/2007)   TG Goal: 150 (01/17/2007)  Prior 10 Yr Risk Heart Disease: 14 % (01/17/2007)   HDL:51.90 (01/31/2010), 53.00 (09/23/2009)  LDL:117 (01/31/2010), 91 (04/54/0981)  Chol:192 (01/31/2010), 161 (09/23/2009)  Trig:118.0 (01/31/2010), 83.0 (09/23/2009)  Complete Medication List: 1)  Lovastatin 40 Mg Tabs (Lovastatin) .... One p.o. daily 2)  Actos 15 Mg Tabs (Pioglitazone hcl) .Marland Kitchen.. 1 by mouth daily 3)  Glyburide 5 Mg Tabs (Glyburide) .... One by mouth daily 4)  Lisinopril-hydrochlorothiazide 20-12.5 Mg Tabs (Lisinopril-hydrochlorothiazide) .... 2 by mouth once daily 5)  Oscal 500/200 D-3 500-200 Mg-unit Tabs (Calcium-vitamin d) .... Two times a day  Patient Instructions: 1)  Please schedule a follow-up appointment in 4 months. 2)  labs one week prior to visit 3)  lipids---272.4 4)  lfts-995.2 5)  bmet-995.2 6)  A1C-250.02 7)       Orders Added: 1)  Est. Patient Level IV [19147]

## 2010-05-11 NOTE — Letter (Signed)
Summary: [facility]  [facility]   Imported By: Maryln Gottron 04/24/2010 14:57:04  _____________________________________________________________________  External Attachment:    Type:   Image     Comment:   External Document

## 2010-05-30 ENCOUNTER — Other Ambulatory Visit (INDEPENDENT_AMBULATORY_CARE_PROVIDER_SITE_OTHER): Payer: Medicare Other | Admitting: Internal Medicine

## 2010-05-30 DIAGNOSIS — E785 Hyperlipidemia, unspecified: Secondary | ICD-10-CM

## 2010-05-30 DIAGNOSIS — E119 Type 2 diabetes mellitus without complications: Secondary | ICD-10-CM

## 2010-05-30 DIAGNOSIS — T887XXA Unspecified adverse effect of drug or medicament, initial encounter: Secondary | ICD-10-CM

## 2010-05-30 DIAGNOSIS — I10 Essential (primary) hypertension: Secondary | ICD-10-CM

## 2010-05-30 LAB — HEPATIC FUNCTION PANEL: Alkaline Phosphatase: 85 U/L (ref 39–117)

## 2010-05-30 LAB — LIPID PANEL
Cholesterol: 195 mg/dL (ref 0–200)
HDL: 53.6 mg/dL (ref >39.00)
Total CHOL/HDL Ratio: 4
Triglycerides: 146 mg/dL (ref 0.0–149.0)
VLDL: 29.2 mg/dL (ref 0.0–40.0)

## 2010-05-30 LAB — BASIC METABOLIC PANEL
Calcium: 9.3 mg/dL (ref 8.4–10.5)
Chloride: 108 mEq/L (ref 96–112)
Creatinine, Ser: 1.7 mg/dL — ABNORMAL HIGH (ref 0.4–1.5)
GFR: 51.97 mL/min — ABNORMAL LOW (ref >60.00)

## 2010-06-06 ENCOUNTER — Encounter: Payer: Self-pay | Admitting: Internal Medicine

## 2010-06-06 ENCOUNTER — Ambulatory Visit (INDEPENDENT_AMBULATORY_CARE_PROVIDER_SITE_OTHER): Payer: Medicare Other | Admitting: Internal Medicine

## 2010-06-06 DIAGNOSIS — N259 Disorder resulting from impaired renal tubular function, unspecified: Secondary | ICD-10-CM

## 2010-06-06 DIAGNOSIS — E119 Type 2 diabetes mellitus without complications: Secondary | ICD-10-CM

## 2010-06-06 DIAGNOSIS — Z8546 Personal history of malignant neoplasm of prostate: Secondary | ICD-10-CM

## 2010-06-06 DIAGNOSIS — E785 Hyperlipidemia, unspecified: Secondary | ICD-10-CM

## 2010-06-06 DIAGNOSIS — I10 Essential (primary) hypertension: Secondary | ICD-10-CM

## 2010-06-06 MED ORDER — LINAGLIPTIN 5 MG PO TABS: 1.0000 | ORAL_TABLET | Freq: Every day | ORAL | Status: DC

## 2010-06-06 NOTE — Assessment & Plan Note (Signed)
Has regular followup with urology. 

## 2010-06-06 NOTE — Assessment & Plan Note (Signed)
Adequate control. Continue current medications. 

## 2010-06-06 NOTE — Progress Notes (Signed)
  Subjective:    Patient ID: [name], male    DOB: [birth year], 70 y.o.   MRN: [id number]  HPI  patient comes in for followup of multiple medical problems including type 2 diabetes, hyperlipidemia, hypertension. The patient does not check blood sugar or blood pressure at home. The patetient does not follow an exercise or diet program. The patient denies any polyuria, polydipsia.  In the past the patient has gone to diabetic treatment center. The patient is tolerating medications  Without difficulty. The patient does admit to medication compliance.   Past Medical History  Diagnosis Date  . Hypertension   . Diabetes mellitus   . Hyperlipidemia   . Prostate cancer   . Renal insufficiency   . Cataract    Past Surgical History  Procedure Date  . Prostate surgery     radiation therapy only    reports that he has quit smoking. He does not have any smokeless tobacco history on file. His alcohol and drug histories not on file. family history is not on file.    No Known Allergies   Review of Systems  patient denies chest pain, shortness of breath, orthopnea. Denies lower extremity edema, abdominal pain, change in appetite, change in bowel movements. Patient denies rashes, musculoskeletal complaints. No other specific complaints in a complete review of systems.      Objective:   Physical Exam  well-developed well-nourished male in no acute distress. HEENT exam atraumatic, normocephalic, neck supple without jugular venous distention. Chest clear to auscultation cardiac exam S1-S2 are regular. Abdominal exam overweight with bowel sounds, soft and nontender. Extremities no edema. Neurologic exam is alert with a normal gait.        Assessment & Plan:

## 2010-06-06 NOTE — Assessment & Plan Note (Addendum)
Stable. Likely due to diabetes. Discussed need for better diabetic control.

## 2010-06-06 NOTE — Assessment & Plan Note (Signed)
worsening likely due to patient noncompliance. He does not follow diabetic diet or exercise. Will add additional medications. Side effects discussed.

## 2010-06-26 ENCOUNTER — Other Ambulatory Visit: Payer: Self-pay | Admitting: Internal Medicine

## 2010-08-19 ENCOUNTER — Emergency Department (HOSPITAL_BASED_OUTPATIENT_CLINIC_OR_DEPARTMENT_OTHER): Payer: Medicare Other

## 2010-08-19 ENCOUNTER — Emergency Department (HOSPITAL_BASED_OUTPATIENT_CLINIC_OR_DEPARTMENT_OTHER)
Admission: EM | Admit: 2010-08-19 | Discharge: 2010-08-20 | Disposition: A | Discharge status: Other Acute Inpatient Hospital | Payer: Medicare Other | Source: Home / Self Care | Attending: Emergency Medicine | Admitting: Emergency Medicine

## 2010-08-19 DIAGNOSIS — N289 Disorder of kidney and ureter, unspecified: Secondary | ICD-10-CM | POA: Insufficient documentation

## 2010-08-19 DIAGNOSIS — C61 Malignant neoplasm of prostate: Secondary | ICD-10-CM | POA: Insufficient documentation

## 2010-08-19 DIAGNOSIS — I10 Essential (primary) hypertension: Secondary | ICD-10-CM | POA: Insufficient documentation

## 2010-08-19 DIAGNOSIS — E7800 Pure hypercholesterolemia, unspecified: Secondary | ICD-10-CM | POA: Insufficient documentation

## 2010-08-19 DIAGNOSIS — E119 Type 2 diabetes mellitus without complications: Secondary | ICD-10-CM | POA: Insufficient documentation

## 2010-08-19 DIAGNOSIS — R000 Tachycardia, unspecified: Secondary | ICD-10-CM | POA: Insufficient documentation

## 2010-08-19 DIAGNOSIS — M79609 Pain in unspecified limb: Secondary | ICD-10-CM | POA: Insufficient documentation

## 2010-08-19 LAB — GLUCOSE, CAPILLARY: Glucose-Capillary: 221 mg/dL — ABNORMAL HIGH (ref 70–99)

## 2010-08-19 LAB — DIFFERENTIAL
Eosinophils Relative: 1 % (ref 0–5)
Lymphocytes Relative: 13 % (ref 12–46)
Lymphs Abs: 1.2 10*3/uL (ref 0.7–4.0)
Monocytes Absolute: 0.6 10*3/uL (ref 0.1–1.0)
Neutro Abs: 7.5 10*3/uL (ref 1.7–7.7)
Neutrophils Relative %: 79 % — ABNORMAL HIGH (ref 43–77)

## 2010-08-19 LAB — BASIC METABOLIC PANEL
CO2: 27 mEq/L (ref 19–32)
Chloride: 101 mEq/L (ref 96–112)
Creatinine, Ser: 1.8 mg/dL — ABNORMAL HIGH (ref 0.4–1.5)
GFR calc Af Amer: 45 mL/min — ABNORMAL LOW (ref >60)

## 2010-08-19 LAB — CBC
Hemoglobin: 9.8 g/dL — ABNORMAL LOW (ref 13.0–17.0)
MCH: 28.3 pg (ref 26.0–34.0)
MCV: 87.9 fL (ref 78.0–100.0)
RBC: 3.46 MIL/uL — ABNORMAL LOW (ref 4.22–5.81)

## 2010-08-20 ENCOUNTER — Inpatient Hospital Stay (HOSPITAL_COMMUNITY): Payer: Medicare Other

## 2010-08-20 ENCOUNTER — Inpatient Hospital Stay (HOSPITAL_COMMUNITY)
Admission: EM | Admit: 2010-08-20 | Discharge: 2010-08-22 | DRG: 299 | Disposition: A | Discharge status: Home / Self Care | Payer: Medicare Other | Source: Other Acute Inpatient Hospital | Attending: Internal Medicine | Admitting: Internal Medicine

## 2010-08-20 DIAGNOSIS — I498 Other specified cardiac arrhythmias: Secondary | ICD-10-CM | POA: Diagnosis present

## 2010-08-20 DIAGNOSIS — M79606 Pain in leg, unspecified: Secondary | ICD-10-CM

## 2010-08-20 DIAGNOSIS — Z8546 Personal history of malignant neoplasm of prostate: Secondary | ICD-10-CM

## 2010-08-20 DIAGNOSIS — IMO0001 Reserved for inherently not codable concepts without codable children: Secondary | ICD-10-CM | POA: Diagnosis present

## 2010-08-20 DIAGNOSIS — I2699 Other pulmonary embolism without acute cor pulmonale: Secondary | ICD-10-CM | POA: Diagnosis present

## 2010-08-20 DIAGNOSIS — I824Y9 Acute embolism and thrombosis of unspecified deep veins of unspecified proximal lower extremity: Principal | ICD-10-CM | POA: Diagnosis present

## 2010-08-20 DIAGNOSIS — I129 Hypertensive chronic kidney disease with stage 1 through stage 4 chronic kidney disease, or unspecified chronic kidney disease: Secondary | ICD-10-CM | POA: Diagnosis present

## 2010-08-20 DIAGNOSIS — N183 Chronic kidney disease, stage 3 unspecified: Secondary | ICD-10-CM | POA: Diagnosis present

## 2010-08-20 DIAGNOSIS — E785 Hyperlipidemia, unspecified: Secondary | ICD-10-CM | POA: Diagnosis present

## 2010-08-20 LAB — GLUCOSE, CAPILLARY: Glucose-Capillary: 129 mg/dL — ABNORMAL HIGH (ref 70–99)

## 2010-08-20 LAB — DIFFERENTIAL
Eosinophils Relative: 2 % (ref 0–5)
Lymphocytes Relative: 22 % (ref 12–46)
Lymphs Abs: 1.9 10*3/uL (ref 0.7–4.0)

## 2010-08-20 LAB — CBC
HCT: 30.9 % — ABNORMAL LOW (ref 39.0–52.0)
Hemoglobin: 10 g/dL — ABNORMAL LOW (ref 13.0–17.0)
MCV: 88 fL (ref 78.0–100.0)
RBC: 3.51 MIL/uL — ABNORMAL LOW (ref 4.22–5.81)
RDW: 13.5 % (ref 11.5–15.5)
WBC: 9 10*3/uL (ref 4.0–10.5)

## 2010-08-20 LAB — BASIC METABOLIC PANEL
BUN: 12 mg/dL (ref 6–23)
CO2: 25 mEq/L (ref 19–32)
Chloride: 104 mEq/L (ref 96–112)
Glucose, Bld: 98 mg/dL (ref 70–99)
Potassium: 3.5 mEq/L (ref 3.5–5.1)

## 2010-08-20 LAB — PROTIME-INR: INR: 1.02 (ref 0.00–1.49)

## 2010-08-20 LAB — HEMOGLOBIN A1C
Hgb A1c MFr Bld: 7.4 % — ABNORMAL HIGH (ref <5.7)
Mean Plasma Glucose: 166 mg/dL — ABNORMAL HIGH (ref <117)

## 2010-08-20 LAB — HEPARIN LEVEL (UNFRACTIONATED): Heparin Unfractionated: 1.94 IU/mL — ABNORMAL HIGH (ref 0.30–0.70)

## 2010-08-20 MED ORDER — TECHNETIUM TO 99M ALBUMIN AGGREGATED
3.8000 | Freq: Once | INTRAVENOUS | Status: AC | PRN
  Administered 2010-08-20: 4 via INTRAVENOUS

## 2010-08-20 MED ORDER — XENON XE 133 GAS
11.8000 | GAS_FOR_INHALATION | Freq: Once | RESPIRATORY_TRACT | Status: AC | PRN
Start: 2010-08-20 — End: 2010-08-20
  Administered 2010-08-20: 12 via RESPIRATORY_TRACT

## 2010-08-21 DIAGNOSIS — M79609 Pain in unspecified limb: Secondary | ICD-10-CM

## 2010-08-21 LAB — BASIC METABOLIC PANEL
BUN: 8 mg/dL (ref 6–23)
CO2: 25 mEq/L (ref 19–32)
Chloride: 108 mEq/L (ref 96–112)
Creatinine, Ser: 1.61 mg/dL — ABNORMAL HIGH (ref 0.4–1.5)
GFR calc Af Amer: 52 mL/min — ABNORMAL LOW (ref >60)
Potassium: 3.5 mEq/L (ref 3.5–5.1)

## 2010-08-21 LAB — CBC
HCT: 29.4 % — ABNORMAL LOW (ref 39.0–52.0)
MCH: 27.4 pg (ref 26.0–34.0)
MCV: 88.6 fL (ref 78.0–100.0)
Platelets: 223 10*3/uL (ref 150–400)
RDW: 13.5 % (ref 11.5–15.5)

## 2010-08-21 LAB — GLUCOSE, CAPILLARY: Glucose-Capillary: 119 mg/dL — ABNORMAL HIGH (ref 70–99)

## 2010-08-21 NOTE — H&P (Signed)
  NAME:  [name], [name]              ACCOUNT NO.:  [id number]  MEDICAL RECORD NO.:  [id number]           PATIENT TYPE:  I  LOCATION:  1506                         FACILITY:  [facility]  PHYSICIAN:  Della Goo, M.D. DATE OF BIRTH:  [birth year]  DATE OF ADMISSION:  08/20/2010 DATE OF DISCHARGE:                             HISTORY & PHYSICAL   ADDENDUM: Continuation of the admission history and physical.  LABORATORY STUDIES:  White blood cell count 9.4, hemoglobin 9.8, hematocrit 30.4, MCV 87.9, platelets 121, neutrophils 79% lymphocytes 13%.  Sodium 139, potassium 3.7, chloride 101, CO2 of 27, BUN 13 and creatinine 1.30, glucose 201.  Protime 13.6, INR 1.02, D-dimer 13.04.  ASSESSMENT:  The patient is a 70 year old male being admitted with: 1. Left lower extremity claudication, rule out deep venous thrombosis     versus peripheral vascular disease. 2. Sinus tachycardia. 3. Hyperglycemia/uncontrolled type 2 diabetes mellitus. 4. Chronic renal insufficiency. 5. Hypertension. 6. Hyperlipidemia. 7. History of prostate cancer.  PLAN:  The patient will be admitted for further evaluation.  A V/Q scan has been ordered to evaluate for possible pulmonary embolism and venous duplex Doppler studies have been ordered of the left lower extremity to rule out a deep venous thrombosis of the left lower extremity.  Pain control therapy has been ordered and gentle IV fluids have also been ordered as well, and the patient will be placed on sliding scale insulin coverage for his elevated blood sugars and his regular medications will be further verified.  A hemoglobin A1c will also be checked.  The patient will be placed on IV heparin drip at this time until the thromboses/emboli have been ruled out.  Code status has been discussed and the patient is a full code.  Further workup will ensue pending results of the patient's clinical course.     Della Goo, M.D.     HJ/MEDQ  D:   08/20/2010  T:  08/20/2010  Job:  [id number]  Electronically Signed by Della Goo M.D. on 08/21/2010 03:39:01 AM

## 2010-08-21 NOTE — H&P (Signed)
[name], [name]              ACCOUNT NO.:  [id number]  MEDICAL RECORD NO.:  [id number]           PATIENT TYPE:  I  LOCATION:  1506                         FACILITY:  [facility]  PHYSICIAN:  Della Goo, M.D. DATE OF BIRTH:  Nov 22, 1940  DATE OF ADMISSION:  08/20/2010 DATE OF DISCHARGE:                             HISTORY & PHYSICAL   DATE OF ADMISSION:  Aug 20, 2010.  PRIMARY CARE PHYSICIAN:  Valetta Mole. Swords, MD  CHIEF COMPLAINT:  Left leg pain and swelling.  HISTORY OF PRESENT ILLNESS:  This is a 70 year old male with multiple medical problems who presents to the [facility] Emergency Department secondary to complaints of increased left lower extremity pain and swelling in the calf area especially after walking.  He has noticed these symptoms for the past 5 days.  He continued to complain so his son made him go to the emergency department for evaluation.  The patient denied having any other symptoms of chest pain, shortness of breath.  He denies having any previous trauma to the area.  He does report that he had a long drive this past week to [place], [place].  In the emergency department, the patient was evaluated.  He had tachycardia with the heart rate of 116 on check-in.  He also had complaints of pain and symptoms consistent with claudication of the left lower extremity.  However, his history of the long drive and the tachycardia also prompted a workup to evaluate for possible deep venous thrombosis and pulmonary embolism.  The patient was found also to have an elevated BUN and creatinine, thereby CT angiogram study of the chest could not be performed and an ultrasound study of the lower extremity was not available at the facility at that time.  Therefore, the patient was referred to [facility] for further evaluation and testing to rule out possible DVT and pulmonary embolism.  The patient was placed on IV heparin and admitted to [facility]  to the medical service.  PAST MEDICAL HISTORY:  Significant for hypertension, type 2 diabetes mellitus, dyslipidemia, prostate cancer status post TURP with questionable recurrence recently.  MEDICATIONS:  His medications will need to be further verified.  He is on glyburide, Klor-Con, and lisinopril/HCTZ  ALLERGIES:  No known drug allergies.  SOCIAL HISTORY:  The patient is a nonsmoker, nondrinker.  No history of illicit drug usage.  FAMILY HISTORY:  Noncontributory.  REVIEW OF SYSTEMS:  Pertinents as mentioned above.  PHYSICAL EXAMINATION FINDINGS:  GENERAL:  This is a 70 year old well- nourished, well-developed African-American male who is in no acute distress or visible discomfort. VITAL SIGNS:  His vital signs are temperature 98.9, blood pressure 140/85, heart rate 116, respirations 20, O2 saturations 100%. HEENT EXAMINATION:  Normocephalic and atraumatic.  Pupils equally round and reactive to light.  Funduscopic examination reveals a large cataract in the left eye but there are no other acute or chronic changes seen. Nares are patent bilaterally.  Oropharynx is clear. NECK:  Supple.  Full range of motion.  No thyromegaly, adenopathy, jugular venous distention. CARDIOVASCULAR:  Regular rate and rhythm.  No murmurs, gallops or rubs  appreciated. LUNGS:  Clear to auscultation bilaterally.  No rales, rhonchi or wheezes. ABDOMEN:  Positive bowel sounds, soft, nontender, nondistended.  No hepatosplenomegaly. EXTREMITIES:  Without cyanosis, clubbing or edema. NEUROLOGIC EXAMINATION:  Nonfocal.  LABORATORY STUDIES:  White blood cell count 9.4, hemoglobin 9.8, hematocrit 30.4, MCV 87.9, platelets 121, neutrophils 79% lymphocytes 13%.  Sodium 139, potassium 3.7, chloride 101, CO2 of 27, BUN 13, creatinine 1.80 and glucose 201.  Protime 13.6, INR 1.02.  Dictation ended at this point.     Della Goo, M.D.     HJ/MEDQ  D:  08/20/2010  T:  08/20/2010  Job:   [id number]  cc:   Valetta Mole. Swords, MD [address] [place] [address]  Electronically Signed by Della Goo M.D. on 08/21/2010 03:38:39 AM

## 2010-08-22 LAB — CBC
HCT: 29.3 % — ABNORMAL LOW (ref 39.0–52.0)
MCH: 28.3 pg (ref 26.0–34.0)
MCV: 87.2 fL (ref 78.0–100.0)
Platelets: 256 10*3/uL (ref 150–400)
RBC: 3.36 MIL/uL — ABNORMAL LOW (ref 4.22–5.81)

## 2010-08-22 LAB — BASIC METABOLIC PANEL
CO2: 24 mEq/L (ref 19–32)
Chloride: 104 mEq/L (ref 96–112)
Glucose, Bld: 87 mg/dL (ref 70–99)
Potassium: 3.9 mEq/L (ref 3.5–5.1)
Sodium: 139 mEq/L (ref 135–145)

## 2010-08-22 LAB — GLUCOSE, CAPILLARY: Glucose-Capillary: 129 mg/dL — ABNORMAL HIGH (ref 70–99)

## 2010-08-25 ENCOUNTER — Other Ambulatory Visit (INDEPENDENT_AMBULATORY_CARE_PROVIDER_SITE_OTHER): Payer: Medicare Other | Admitting: Internal Medicine

## 2010-08-25 DIAGNOSIS — E119 Type 2 diabetes mellitus without complications: Secondary | ICD-10-CM

## 2010-08-25 DIAGNOSIS — I10 Essential (primary) hypertension: Secondary | ICD-10-CM

## 2010-08-25 DIAGNOSIS — I82409 Acute embolism and thrombosis of unspecified deep veins of unspecified lower extremity: Secondary | ICD-10-CM

## 2010-08-25 DIAGNOSIS — I82509 Chronic embolism and thrombosis of unspecified deep veins of unspecified lower extremity: Secondary | ICD-10-CM | POA: Insufficient documentation

## 2010-08-25 DIAGNOSIS — E785 Hyperlipidemia, unspecified: Secondary | ICD-10-CM

## 2010-08-25 LAB — BASIC METABOLIC PANEL
Calcium: 9.1 mg/dL (ref 8.4–10.5)
Creatinine, Ser: 2.2 mg/dL — ABNORMAL HIGH (ref 0.4–1.5)
GFR: 37.71 mL/min — ABNORMAL LOW (ref >60.00)
Sodium: 143 mEq/L (ref 135–145)

## 2010-08-25 LAB — HEPATIC FUNCTION PANEL
AST: 64 U/L — ABNORMAL HIGH (ref 0–37)
Albumin: 2.9 g/dL — ABNORMAL LOW (ref 3.5–5.2)
Alkaline Phosphatase: 86 U/L (ref 39–117)
Bilirubin, Direct: 0.1 mg/dL (ref 0.0–0.3)
Total Protein: 5.8 g/dL — ABNORMAL LOW (ref 6.0–8.3)

## 2010-08-25 LAB — LIPID PANEL
Cholesterol: 143 mg/dL (ref 0–200)
Triglycerides: 115 mg/dL (ref 0.0–149.0)

## 2010-08-25 NOTE — Patient Instructions (Signed)
Patient has been on coumadin for only 2 days,was taking 7.5mg  qd.Hold for 2 days then 3.75mg  on tuesdays,thursdays and sundays 7.5mg  on other days check in 1 week

## 2010-08-25 NOTE — Op Note (Signed)
NAMELAMONTA, CYPRESS NO.:  [id number]   MEDICAL RECORD NO.:  [id number]          PATIENT TYPE:  AMB   LOCATION:  [facility]                         FACILITY:  [facility]   PHYSICIAN:  Glade Nurse, MD     DATE OF BIRTH:  05/22/1940   DATE OF PROCEDURE:  02/01/2005  DATE OF DISCHARGE:                                 OPERATIVE REPORT   PREOPERATIVE DIAGNOSIS:  Elevated prostate-specific antigen.   POSTOPERATIVE DIAGNOSIS:  Elevated prostate-specific antigen.   PROCEDURE:  Transrectal ultrasound guided biopsy of the prostate.   SURGEON:  Javier Glazier, M.D.   ASSISTANT:  Glade Nurse, M.D.   ANESTHESIA:  General endotracheal.   SPECIMENS:  Left and right prostate biopsy.   INDICATIONS:  This is a very pleasant 70 year old gentleman with a history  of prostate cancer.  He initially had intermediate risk disease and was  treated with brachy and external beam radiation.  He has done well since  that time over the last year.  His PSA has risen from a nadir of less than 1  to 2.62.  He was evaluated with a ProstaScint scan which showed some  residual activity in his prostatic bed.  After discussing his options, he  has elected to proceed with repeat biopsy.  The patient has chronic anxiety  syndrome and he was unable to be biopsied in the office.   PROCEDURE:  The patient was then identified by his wrist bracelet and  brought to room #1 where he received preoperative antibiotics and was  administered general anesthesia.  He was prepped and draped in the left  lateral decubitus position taking care to minimize peripheral neuropathy or  compartment syndrome.  Next, we gently dilated his center digitally and  placed the ultrasound probe.  The patient's gland was evaluated and measured  to be 67 mL, however, on exam his prostate exam was consistent with radiated  prostate. and it was very small which does not correlate to ultrasound  volume. There were no areas of  nodularity on our ultrasound exam.  Next, in  the sagittal mode we started on the right and took biopsies at the base, mid  and apex peripherally. This was then repeated the base, mid and apex heavily  in between periphery and the midline. This biopsy scheme was then repeated  on the left.  Next, we removed probe and held digital pressure on his  prostatic fossa for approximately 1 minute.  The patient was then reversed  from his anesthesia which he tolerated without complication.  Please note  Dr. Javier Glazier was present and participated in all aspects of his case.           ______________________________  Glade Nurse, MD     MT/MEDQ  D:  02/01/2005  T:  02/01/2005  Job:  [id number]

## 2010-08-25 NOTE — Op Note (Signed)
NAME:  [name], [name]              ACCOUNT NO.:  [id number]   MEDICAL RECORD NO.:  [id number]          PATIENT TYPE:  AMB   LOCATION:  DAY                          FACILITY:  [facility]   PHYSICIAN:  Sigmund I. Patsi Sears, M.D.DATE OF BIRTH:  [birth year]   DATE OF PROCEDURE:  04/27/2005  DATE OF DISCHARGE:                                 OPERATIVE REPORT   PREOPERATIVE DIAGNOSIS:  Adenocarcinoma of the prostate, recurrent.   POSTOPERATIVE DIAGNOSIS:  Adenocarcinoma of the prostate, recurrent.   OPERATION:  Cryotherapy of the prostate.   SURGEON:  Sigmund I. Patsi Sears, M.D.   ANESTHESIA:  General LMA.   PREPARATION:  After appropriate preanesthesia, the patient is brought to the  operating room, placed on the operating table in dorsal supine position  where general LMA anesthesia was introduced. He was then replaced in dorsal  lithotomy position where the pubis was prepped with Betadine solution and  draped in the usual fashion.   Cystourethroscopy was accomplished and showed a normal-appearing urethra,  with a long prostatic fossa. With the bladder full, and in slight  Trendelenburg position, a suprapubic catheter was placed with a stab wound,  and then the suprapubic catheter being placed. The patient was then placed  in flat position. The suprapubic tube was coiled and positioned in standard  fashion.   REVIEW OF HISTORY:  This 70 year old male has a history of adenocarcinoma of  the prostate, status post radiation therapy for a Gleason 7 tumor in 2001  (71 Gy). The patient now has a return of PSA of 2.9, with Gleason score 8  adenocarcinoma on repeat biopsies. Comorbidities include diabetes,  hypertension, and chronic anxiety. He is now for cryotherapy of the  prostate.   DESCRIPTION OF PROCEDURE:  The cryoprobes are placed under ultrasonographic  control, in both horizontal and lateral planes, as well as fluoroscopic  control. Repeat cystoscopy is accomplished, and shows no  evidence of a  probes within the urethra or the bladder.   Thermosensors are placed in standard fashion. The urethral warming device is  placed in standard fashion.   The patient then undergoes two freeze/thaw cycles, with excellent prior  degradation of the prostate. Following final thaw, the probes were removed,  suprapubic left to straight drainage, the warming probe left in place for 20  more minutes, and the patient was given IV Toradol, taken to the recovery  room in good condition.      Sigmund I. Patsi Sears, M.D.  Electronically Signed     SIT/MEDQ  D:  04/27/2005  T:  04/27/2005  Job:  [id number]

## 2010-08-29 NOTE — Discharge Summary (Signed)
[name], [name]              ACCOUNT NO.:  [id number]  MEDICAL RECORD NO.:  [id number]           PATIENT TYPE:  E  LOCATION:  [facility]                         FACILITY:  [facility]  PHYSICIAN:  Kevin Blinks, MD     DATE OF BIRTH:  04/18/1940  DATE OF ADMISSION:  08/19/2010 DATE OF DISCHARGE:  08/20/2010                              DISCHARGE SUMMARY   PRIMARY CARE PHYSICIAN:  Kevin Mole. Swords, MD  DISCHARGE DIAGNOSES: 1. Acute bilateral pulmonary emboli. 2. Acute left lower extremity deep venous thrombosis. 3. Chronic kidney disease, stage III. 4. History of prostate cancer. 5. Hypertension. 6. Non-insulin dependent diabetes.  DISCHARGE MEDICATIONS: 1. Lovenox 140 mg subcutaneously q.24 h, to be taken until INR greater     than 2 2. Coumadin 7.5 mg p.o. daily. 3. Tradjenta 5 mg 1 tablet p.o. daily. 4. Klor-Con 20 mEq 1 tablet p.o. daily. 5. Glyburide 5 mg p.o. b.i.d. 6. Lisinopril/hydrochlorothiazide 20/12.5 mg 1 tablet p.o. daily. 7. Os-Cal vitamin D 1 tablet p.o. b.i.d.  ADMISSION HISTORY:  This is a 70 year old gentleman who presents to the [facility] Emergency Department with complaint of increased left lower extremity pain and swelling in the calf area, especially when walking.  The patient had noticed these symptoms for 5 days prior to admission.  His son had forced him to go to the emergency room for evaluation.  The patient was found to have heart rate of 116 on arrival.  There was high suspicion for pulmonary embolism and DVT; therefore, he was transferred to [facility] for further workup.  For further details, please refer to the history and physical dictated by Dr. Lovell Sheehan on Aug 20, 2010.  HOSPITAL COURSE: 1. Acute bilateral pulmonary embolisms, patient was found to have     acute bilateral pulmonary emboli on VQ scan.  His lower extremity     Dopplers were positive for left lower extremity DVT.  The patient     was empirically  started on heparin.  This was switched over to     Lovenox and he has been started on Coumadin.  He is received     Lovenox teaching here in the hospital.  He has also been started on     Coumadin.  His most recent INR is 1.3.  He will have a followup INR     check with his primary care physician on Friday, and his Coumadin     dose can be adjusted at that time.  If his INR is greater than 2,     then Lovenox can be discontinued, otherwise he will need to be     continued. 2. Chronic kidney disease, stage III, his creatinine is 1.6, has     remained stable through his hospital course. 3. Non-insulin-dependent diabetes, he will continue his outpatient     regimen. 4. History of prostate cancer, patient will follow up with Dr.     Patsi Sears for further treatment.  CONSULTATIONS:  None.  DIAGNOSTIC IMAGING: 1. Chest x-ray on admission shows no acute cardiopulmonary process. 2. VQ scan on admission shows high probability ventilation-perfusion  lung scan for pulmonary embolism. 3. Lower extremity Dopplers shows left DVT in distal femoral vein,     popliteal vein, gastrocnemius veins, and peroneal veins.  DISCHARGE INSTRUCTIONS:  The patient should follow up with his primary care physician as scheduled next Friday.  He will have a repeat INR checked on this Friday, Aug 25, 2010.  His appointment with his primary doctor is Sep 01, 2010.  He should continue on heart-healthy low-calorie diet.  Conduct his activities as tolerated.  Plan was discussed with the patient who understands and is in agreement.  CONDITION AT THE TIME OF DISCHARGE:  Improved.     Kevin Blinks, MD     JM/MEDQ  D:  08/22/2010  T:  08/22/2010  Job:  [id number]  cc:   Kevin Mole. Swords, MD [address] [place] [address]  Electronically Signed by [name]  on 08/29/2010 02:22:40 PM

## 2010-09-01 ENCOUNTER — Ambulatory Visit (INDEPENDENT_AMBULATORY_CARE_PROVIDER_SITE_OTHER): Payer: Medicare Other | Admitting: Internal Medicine

## 2010-09-01 ENCOUNTER — Encounter: Payer: Self-pay | Admitting: Internal Medicine

## 2010-09-01 VITALS — BP 130/84 | HR 88 | Temp 97.6°F | Wt 179.0 lb

## 2010-09-01 DIAGNOSIS — N259 Disorder resulting from impaired renal tubular function, unspecified: Secondary | ICD-10-CM

## 2010-09-01 DIAGNOSIS — E119 Type 2 diabetes mellitus without complications: Secondary | ICD-10-CM

## 2010-09-01 DIAGNOSIS — I2699 Other pulmonary embolism without acute cor pulmonale: Secondary | ICD-10-CM

## 2010-09-01 DIAGNOSIS — I10 Essential (primary) hypertension: Secondary | ICD-10-CM

## 2010-09-01 DIAGNOSIS — I82409 Acute embolism and thrombosis of unspecified deep veins of unspecified lower extremity: Secondary | ICD-10-CM

## 2010-09-01 NOTE — Progress Notes (Signed)
Addended by: Lindley Magnus on: 09/01/2010 09:36 AM   Modules accepted: Orders

## 2010-09-01 NOTE — Progress Notes (Signed)
  Subjective:    Patient ID: [name], male    DOB: 1940/10/20, 70 y.o.   MRN: [id number]  HPI  Pt comes in for f/u but also recent dx of DVT/PE---developed LLE tightening---found to have tachycardia. Eventually dx with DVT and PE. Treated with lovenox, warfarin----now feeling well.   patient comes in for followup of multiple medical problems including type 2 diabetes, hyperlipidemia, hypertension. The patient does not check blood sugar or blood pressure at home. The patetient does not follow an exercise or diet program. The patient denies any polyuria, polydipsia.  In the past the patient has gone to diabetic treatment center. The patient is tolerating medications  Without difficulty. The patient does admit to medication compliance.   Past Medical History  Diagnosis Date  . Hypertension   . Diabetes mellitus   . Hyperlipidemia   . Prostate cancer   . Renal insufficiency   . Cataract    Past Surgical History  Procedure Date  . Prostate surgery     radiation therapy only    reports that he has quit smoking. He does not have any smokeless tobacco history on file. His alcohol and drug histories not on file. family history is not on file. No Known Allergies  .  Review of Systems  patient denies chest pain, shortness of breath, orthopnea. Denies lower extremity edema, abdominal pain, change in appetite, change in bowel movements. Patient denies rashes, musculoskeletal complaints. No other specific complaints in a complete review of systems.      Objective:   Physical Exam  well-developed well-nourished male in no acute distress. HEENT exam atraumatic, normocephalic, neck supple without jugular venous distention. Chest clear to auscultation cardiac exam S1-S2 are regular. Abdominal exam overweight with bowel sounds, soft and nontender. Extremities no edema. Neurologic exam is alert with a normal gait.        Assessment & Plan:

## 2010-09-01 NOTE — Assessment & Plan Note (Signed)
He has finally changed his diet the past two weeks (since PE dx) Continue low CHO diet

## 2010-09-01 NOTE — Patient Instructions (Signed)
Get protime in one week    Latest dosing instructions   Total Sun Mon Tue Wed Thu Fri Sat   33.75 3.75 mg 7.5 mg 3.75 mg 3.75 mg 7.5 mg 3.75 mg 3.75 mg    (7.5 mg0.5) (7.5 mg1) (7.5 mg0.5) (7.5 mg0.5) (7.5 mg1) (7.5 mg0.5) (7.5 mg0.5)

## 2010-09-01 NOTE — Assessment & Plan Note (Signed)
Fair control Continue meds 

## 2010-09-01 NOTE — Assessment & Plan Note (Signed)
Continue regular f/u

## 2010-09-01 NOTE — Assessment & Plan Note (Signed)
Much improved Stay on warfarin for at least 6 months Hx prostate CA---not known to be metastatic

## 2010-09-08 ENCOUNTER — Ambulatory Visit (INDEPENDENT_AMBULATORY_CARE_PROVIDER_SITE_OTHER): Payer: Medicare Other | Admitting: Internal Medicine

## 2010-09-08 DIAGNOSIS — I82409 Acute embolism and thrombosis of unspecified deep veins of unspecified lower extremity: Secondary | ICD-10-CM

## 2010-09-08 NOTE — Patient Instructions (Signed)
Same dose  Check in 4 weeks  

## 2010-09-22 ENCOUNTER — Ambulatory Visit: Payer: Medicare Other

## 2010-09-22 DIAGNOSIS — I82409 Acute embolism and thrombosis of unspecified deep veins of unspecified lower extremity: Secondary | ICD-10-CM

## 2010-09-22 LAB — POCT INR: INR: 3.5

## 2010-09-22 MED ORDER — WARFARIN SODIUM 5 MG PO TABS: 5.0000 mg | ORAL_TABLET | Freq: Every day | ORAL | Status: DC

## 2010-09-22 MED ORDER — LINAGLIPTIN 5 MG PO TABS: 5.0000 mg | ORAL_TABLET | Freq: Every day | ORAL | Status: DC

## 2010-09-22 NOTE — Patient Instructions (Signed)
5mg. Every day 

## 2010-09-25 ENCOUNTER — Other Ambulatory Visit: Payer: Self-pay | Admitting: *Deleted

## 2010-09-25 MED ORDER — LISINOPRIL-HYDROCHLOROTHIAZIDE 20-12.5 MG PO TABS: 2.0000 | ORAL_TABLET | Freq: Every day | ORAL | Status: DC

## 2010-10-09 ENCOUNTER — Ambulatory Visit (INDEPENDENT_AMBULATORY_CARE_PROVIDER_SITE_OTHER): Payer: Medicare Other | Admitting: Internal Medicine

## 2010-10-09 DIAGNOSIS — I82409 Acute embolism and thrombosis of unspecified deep veins of unspecified lower extremity: Secondary | ICD-10-CM

## 2010-10-09 DIAGNOSIS — Z7901 Long term (current) use of anticoagulants: Secondary | ICD-10-CM

## 2010-10-09 NOTE — Patient Instructions (Signed)
Alternate 5mg . And 2.5mg .

## 2010-10-16 ENCOUNTER — Ambulatory Visit: Payer: Medicare Other

## 2010-10-16 DIAGNOSIS — I82409 Acute embolism and thrombosis of unspecified deep veins of unspecified lower extremity: Secondary | ICD-10-CM

## 2010-10-16 LAB — POCT INR: INR: 2.2

## 2010-11-16 ENCOUNTER — Ambulatory Visit (INDEPENDENT_AMBULATORY_CARE_PROVIDER_SITE_OTHER): Payer: Medicare Other | Admitting: Internal Medicine

## 2010-11-16 DIAGNOSIS — I82409 Acute embolism and thrombosis of unspecified deep veins of unspecified lower extremity: Secondary | ICD-10-CM

## 2010-11-16 LAB — POCT INR: INR: 4

## 2010-11-16 NOTE — Patient Instructions (Addendum)
  2.5 mg everyday, Check in 3 week By Dr.Jenkins

## 2010-12-07 ENCOUNTER — Ambulatory Visit (INDEPENDENT_AMBULATORY_CARE_PROVIDER_SITE_OTHER): Payer: Medicare Other

## 2010-12-07 DIAGNOSIS — I82409 Acute embolism and thrombosis of unspecified deep veins of unspecified lower extremity: Secondary | ICD-10-CM

## 2010-12-07 LAB — POCT INR: INR: 1.3

## 2010-12-07 NOTE — Patient Instructions (Signed)
2.5 mg,2.5 mg 5 mg alternate , Check in 4 week By Dr.Jenkins

## 2010-12-18 ENCOUNTER — Other Ambulatory Visit: Payer: Self-pay | Admitting: Internal Medicine

## 2010-12-29 ENCOUNTER — Other Ambulatory Visit (INDEPENDENT_AMBULATORY_CARE_PROVIDER_SITE_OTHER): Payer: Medicare Other

## 2010-12-29 DIAGNOSIS — N259 Disorder resulting from impaired renal tubular function, unspecified: Secondary | ICD-10-CM

## 2010-12-29 DIAGNOSIS — I10 Essential (primary) hypertension: Secondary | ICD-10-CM

## 2010-12-29 DIAGNOSIS — E119 Type 2 diabetes mellitus without complications: Secondary | ICD-10-CM

## 2010-12-29 DIAGNOSIS — I82409 Acute embolism and thrombosis of unspecified deep veins of unspecified lower extremity: Secondary | ICD-10-CM

## 2010-12-29 LAB — HEMOGLOBIN A1C: Hgb A1c MFr Bld: 7.3 % — ABNORMAL HIGH (ref 4.6–6.5)

## 2010-12-29 LAB — HEPATIC FUNCTION PANEL
ALT: 10 U/L (ref 0–53)
AST: 21 U/L (ref 0–37)
Bilirubin, Direct: 0 mg/dL (ref 0.0–0.3)
Total Bilirubin: 0.4 mg/dL (ref 0.3–1.2)
Total Protein: 6.5 g/dL (ref 6.0–8.3)

## 2010-12-29 LAB — BASIC METABOLIC PANEL
BUN: 13 mg/dL (ref 6–23)
Calcium: 9.1 mg/dL (ref 8.4–10.5)
GFR: 37.29 mL/min — ABNORMAL LOW (ref >60.00)
Glucose, Bld: 136 mg/dL — ABNORMAL HIGH (ref 70–99)
Potassium: 4.5 mEq/L (ref 3.5–5.1)
Sodium: 144 mEq/L (ref 135–145)

## 2010-12-29 LAB — LIPID PANEL: HDL: 51.6 mg/dL (ref >39.00)

## 2010-12-29 NOTE — Patient Instructions (Signed)
Same dose, 2.5 mg,2.5 mg 5 mg alternate , Check in 4 week By Dr.Jenkins

## 2011-01-01 ENCOUNTER — Other Ambulatory Visit: Payer: Self-pay | Admitting: Internal Medicine

## 2011-01-03 ENCOUNTER — Other Ambulatory Visit: Payer: Medicare Other

## 2011-01-09 ENCOUNTER — Ambulatory Visit: Payer: Medicare Other | Admitting: Internal Medicine

## 2011-01-12 LAB — GLUCOSE, CAPILLARY
Glucose-Capillary: 107 mg/dL — ABNORMAL HIGH (ref 70–99)
Glucose-Capillary: 124 mg/dL — ABNORMAL HIGH (ref 70–99)

## 2011-01-26 ENCOUNTER — Other Ambulatory Visit (INDEPENDENT_AMBULATORY_CARE_PROVIDER_SITE_OTHER): Payer: Medicare Other

## 2011-01-26 DIAGNOSIS — I82409 Acute embolism and thrombosis of unspecified deep veins of unspecified lower extremity: Secondary | ICD-10-CM

## 2011-01-26 LAB — POCT INR: INR: 1.9

## 2011-01-26 NOTE — Patient Instructions (Signed)
  Latest dosing instructions   Total Glynis Smiles Tue Wed Thu Fri Sat   25 2.5 mg 5 mg 2.5 mg 2.5 mg 5 mg 5 mg 2.5 mg    (5 mg0.5) (5 mg1) (5 mg0.5) (5 mg0.5) (5 mg1) (5 mg1) (5 mg0.5)

## 2011-02-26 ENCOUNTER — Ambulatory Visit: Payer: Medicare Other | Admitting: Internal Medicine

## 2011-02-26 DIAGNOSIS — I82409 Acute embolism and thrombosis of unspecified deep veins of unspecified lower extremity: Secondary | ICD-10-CM

## 2011-02-26 LAB — POCT INR: INR: 2.2

## 2011-02-26 NOTE — Patient Instructions (Signed)
  Latest dosing instructions   Total Sun Mon Tue Wed Thu Fri Sat   25 2.5 mg 5 mg 2.5 mg 2.5 mg 5 mg 5 mg 2.5 mg    (5 mg0.5) (5 mg1) (5 mg0.5) (5 mg0.5) (5 mg1) (5 mg1) (5 mg0.5)        

## 2011-03-23 ENCOUNTER — Ambulatory Visit (INDEPENDENT_AMBULATORY_CARE_PROVIDER_SITE_OTHER): Payer: Medicare Other | Admitting: Internal Medicine

## 2011-03-23 DIAGNOSIS — I82409 Acute embolism and thrombosis of unspecified deep veins of unspecified lower extremity: Secondary | ICD-10-CM

## 2011-03-23 LAB — POCT INR: INR: 2.6

## 2011-03-23 NOTE — Patient Instructions (Signed)
  Latest dosing instructions   Total Sun Mon Tue Wed Thu Fri Sat   25 2.5 mg 5 mg 2.5 mg 2.5 mg 5 mg 5 mg 2.5 mg    (5 mg0.5) (5 mg1) (5 mg0.5) (5 mg0.5) (5 mg1) (5 mg1) (5 mg0.5)        

## 2011-03-27 ENCOUNTER — Ambulatory Visit: Payer: No Typology Code available for payment source

## 2011-03-29 ENCOUNTER — Other Ambulatory Visit: Payer: Self-pay | Admitting: Internal Medicine

## 2011-03-30 MED ORDER — POTASSIUM CHLORIDE CRYS ER 20 MEQ PO TBCR: 20.0000 meq | EXTENDED_RELEASE_TABLET | Freq: Every day | ORAL | Status: DC

## 2011-04-30 ENCOUNTER — Other Ambulatory Visit (INDEPENDENT_AMBULATORY_CARE_PROVIDER_SITE_OTHER): Payer: Medicare Other

## 2011-04-30 DIAGNOSIS — Z0000 Encounter for general adult medical examination without abnormal findings: Secondary | ICD-10-CM | POA: Diagnosis not present

## 2011-04-30 DIAGNOSIS — I82409 Acute embolism and thrombosis of unspecified deep veins of unspecified lower extremity: Secondary | ICD-10-CM

## 2011-04-30 DIAGNOSIS — E785 Hyperlipidemia, unspecified: Secondary | ICD-10-CM

## 2011-04-30 DIAGNOSIS — E119 Type 2 diabetes mellitus without complications: Secondary | ICD-10-CM | POA: Diagnosis not present

## 2011-04-30 DIAGNOSIS — R972 Elevated prostate specific antigen [PSA]: Secondary | ICD-10-CM | POA: Diagnosis not present

## 2011-04-30 LAB — CBC WITH DIFFERENTIAL/PLATELET
Eosinophils Relative: 3.1 % (ref 0.0–5.0)
HCT: 30.3 % — ABNORMAL LOW (ref 39.0–52.0)
Hemoglobin: 10.1 g/dL — ABNORMAL LOW (ref 13.0–17.0)
Lymphs Abs: 1.4 10*3/uL (ref 0.7–4.0)
Monocytes Relative: 7.1 % (ref 3.0–12.0)
Neutro Abs: 3.9 10*3/uL (ref 1.4–7.7)
RBC: 3.46 Mil/uL — ABNORMAL LOW (ref 4.22–5.81)
WBC: 6 10*3/uL (ref 4.5–10.5)

## 2011-04-30 LAB — MICROALBUMIN / CREATININE URINE RATIO: Microalb, Ur: 1 mg/dL (ref 0.0–1.9)

## 2011-04-30 LAB — HEPATIC FUNCTION PANEL
ALT: 10 U/L (ref 0–53)
AST: 17 U/L (ref 0–37)
Albumin: 3.5 g/dL (ref 3.5–5.2)
Total Bilirubin: 0.5 mg/dL (ref 0.3–1.2)

## 2011-04-30 LAB — POCT URINALYSIS DIPSTICK
Ketones, UA: NEGATIVE
Leukocytes, UA: NEGATIVE
Nitrite, UA: NEGATIVE
Protein, UA: NEGATIVE

## 2011-04-30 LAB — BASIC METABOLIC PANEL
GFR: 37.25 mL/min — ABNORMAL LOW (ref >60.00)
Potassium: 4.7 mEq/L (ref 3.5–5.1)
Sodium: 145 mEq/L (ref 135–145)

## 2011-04-30 LAB — TSH: TSH: 1.59 u[IU]/mL (ref 0.35–5.50)

## 2011-04-30 LAB — HEMOGLOBIN A1C: Hgb A1c MFr Bld: 7.3 % — ABNORMAL HIGH (ref 4.6–6.5)

## 2011-04-30 LAB — LIPID PANEL: VLDL: 36 mg/dL (ref 0.0–40.0)

## 2011-04-30 NOTE — Patient Instructions (Signed)
  Latest dosing instructions   Total Sun Mon Tue Wed Thu Fri Sat   27.5 2.5 mg 5 mg 2.5 mg 2.5 mg 5 mg 5 mg 5 mg    (5 mg0.5) (5 mg1) (5 mg0.5) (5 mg0.5) (5 mg1) (5 mg1) (5 mg1)        

## 2011-05-03 ENCOUNTER — Other Ambulatory Visit: Payer: Self-pay | Admitting: Internal Medicine

## 2011-05-07 ENCOUNTER — Encounter: Payer: Self-pay | Admitting: Internal Medicine

## 2011-05-07 ENCOUNTER — Ambulatory Visit (INDEPENDENT_AMBULATORY_CARE_PROVIDER_SITE_OTHER): Payer: Medicare Other | Admitting: Internal Medicine

## 2011-05-07 VITALS — BP 140/82 | HR 92 | Temp 98.6°F | Wt 179.0 lb

## 2011-05-07 DIAGNOSIS — E785 Hyperlipidemia, unspecified: Secondary | ICD-10-CM

## 2011-05-07 DIAGNOSIS — E119 Type 2 diabetes mellitus without complications: Secondary | ICD-10-CM | POA: Diagnosis not present

## 2011-05-07 DIAGNOSIS — Z23 Encounter for immunization: Secondary | ICD-10-CM | POA: Diagnosis not present

## 2011-05-07 DIAGNOSIS — I82409 Acute embolism and thrombosis of unspecified deep veins of unspecified lower extremity: Secondary | ICD-10-CM | POA: Diagnosis not present

## 2011-05-07 DIAGNOSIS — I2699 Other pulmonary embolism without acute cor pulmonale: Secondary | ICD-10-CM

## 2011-05-07 NOTE — Assessment & Plan Note (Signed)
Diagnosed 5/12 I'll consider dc meds when i see him in 6 months

## 2011-05-07 NOTE — Assessment & Plan Note (Signed)
Check labs today---he misunderstood and has stopped lovastatin---likely will need statin

## 2011-05-07 NOTE — Assessment & Plan Note (Signed)
Check labs today He understands need for diet and exercise---he admits he will not pursue either

## 2011-05-07 NOTE — Progress Notes (Signed)
Patient ID: [name], male   DOB: [birth year], 71 y.o.   MRN: [id number] Patient Active Problem List  Diagnoses  . DIABETES MELLITUS, TYPE II--no home cbgs. . He does not follow a diet  . HYPERLIPIDEMIA---tolerating meds  . HYPERTENSION--no home BPs  . RENAL INSUFFICIENCY---needs f/u labs  . PROSTATE CANCER, HX OF--no known recurence  . DVT (deep venous thrombosis)---same time as PE  . Pulmonary embolus---diagnosed 5/12---still on warfarin. We will leave on for one year and then stop (I'll see him back in 6 months)    Past Medical History  Diagnosis Date  . Hypertension   . Diabetes mellitus   . Hyperlipidemia   . Prostate cancer   . Renal insufficiency   . Cataract     History   Social History  . Marital Status: Married    Spouse Name: N/A    Number of Children: N/A  . Years of Education: N/A   Occupational History  . Not on file.   Social History Main Topics  . Smoking status: Former Games developer  . Smokeless tobacco: Not on file  . Alcohol Use:   . Drug Use:   . Sexually Active:    Other Topics Concern  . Not on file   Social History Narrative  . No narrative on file    Past Surgical History  Procedure Date  . Prostate surgery     radiation therapy only    No family history on file.  No Known Allergies  Current Outpatient Prescriptions on File Prior to Visit  Medication Sig Dispense Refill  . calcium-vitamin D 500 MG tablet Take 1 tablet by mouth 2 (two) times daily.        Marland Kitchen glyBURIDE (DIABETA) 5 MG tablet TAKE 1 TABLET BY MOUTH EVERY DAY  90 tablet  2  . lisinopril-hydrochlorothiazide (PRINZIDE,ZESTORETIC) 20-12.5 MG per tablet TAKE 2 TABLETS BY MOUTH DAILY.  180 tablet  0  . potassium chloride SA (KLOR-CON M20) 20 MEQ tablet Take 1 tablet (20 mEq total) by mouth daily.  90 tablet  3  . TRADJENTA 5 MG TABS tablet TAKE 1 TABLET BY MOUTH DAILY  90 tablet  1  . warfarin (COUMADIN) 5 MG tablet Take 1 tablet (5 mg total) by mouth daily.  30 tablet  11       patient denies chest pain, shortness of breath, orthopnea. Denies lower extremity edema, abdominal pain, change in appetite, change in bowel movements. Patient denies rashes, musculoskeletal complaints. No other specific complaints in a complete review of systems.   BP 152/82  Pulse 92  Temp(Src) 98.6 F (37 C) (Oral)  Wt 179 lb (81.194 kg)  well-developed well-nourished male in no acute distress. HEENT exam atraumatic, normocephalic, neck supple without jugular venous distention. Chest clear to auscultation cardiac exam S1-S2 are regular. Abdominal exam overweight with bowel sounds, soft and nontender. Extremities no edema. Neurologic exam is alert with a normal gait.

## 2011-05-28 ENCOUNTER — Ambulatory Visit: Payer: No Typology Code available for payment source

## 2011-05-28 ENCOUNTER — Ambulatory Visit: Payer: Medicare Other | Admitting: Internal Medicine

## 2011-05-28 DIAGNOSIS — I82409 Acute embolism and thrombosis of unspecified deep veins of unspecified lower extremity: Secondary | ICD-10-CM

## 2011-05-28 NOTE — Patient Instructions (Signed)
  Latest dosing instructions   Total Sun Mon Tue Wed Thu Fri Sat   27.5 2.5 mg 5 mg 2.5 mg 2.5 mg 5 mg 5 mg 5 mg    (5 mg0.5) (5 mg1) (5 mg0.5) (5 mg0.5) (5 mg1) (5 mg1) (5 mg1)        

## 2011-06-04 ENCOUNTER — Ambulatory Visit: Payer: No Typology Code available for payment source

## 2011-06-11 ENCOUNTER — Ambulatory Visit (INDEPENDENT_AMBULATORY_CARE_PROVIDER_SITE_OTHER): Payer: Medicare Other | Admitting: Internal Medicine

## 2011-06-11 DIAGNOSIS — I82409 Acute embolism and thrombosis of unspecified deep veins of unspecified lower extremity: Secondary | ICD-10-CM

## 2011-06-11 NOTE — Patient Instructions (Signed)
  Latest dosing instructions   Total Sun Mon Tue Wed Thu Fri Sat   27.5 2.5 mg 5 mg 2.5 mg 2.5 mg 5 mg 5 mg 5 mg    (5 mg0.5) (5 mg1) (5 mg0.5) (5 mg0.5) (5 mg1) (5 mg1) (5 mg1)

## 2011-06-12 DIAGNOSIS — E559 Vitamin D deficiency, unspecified: Secondary | ICD-10-CM | POA: Diagnosis not present

## 2011-06-12 DIAGNOSIS — C61 Malignant neoplasm of prostate: Secondary | ICD-10-CM | POA: Diagnosis not present

## 2011-06-19 ENCOUNTER — Ambulatory Visit: Payer: No Typology Code available for payment source | Admitting: Internal Medicine

## 2011-06-19 DIAGNOSIS — C61 Malignant neoplasm of prostate: Secondary | ICD-10-CM | POA: Diagnosis not present

## 2011-06-25 ENCOUNTER — Other Ambulatory Visit: Payer: Self-pay | Admitting: Internal Medicine

## 2011-07-12 ENCOUNTER — Ambulatory Visit (INDEPENDENT_AMBULATORY_CARE_PROVIDER_SITE_OTHER): Payer: Medicare Other | Admitting: Internal Medicine

## 2011-07-12 DIAGNOSIS — I82409 Acute embolism and thrombosis of unspecified deep veins of unspecified lower extremity: Secondary | ICD-10-CM

## 2011-07-12 NOTE — Patient Instructions (Signed)
  Latest dosing instructions   Total Sun Mon Tue Wed Thu Fri Sat   27.5 2.5 mg 5 mg 2.5 mg 2.5 mg 5 mg 5 mg 5 mg    (5 mg0.5) (5 mg1) (5 mg0.5) (5 mg0.5) (5 mg1) (5 mg1) (5 mg1)

## 2011-08-13 ENCOUNTER — Ambulatory Visit: Payer: Medicare Other

## 2011-08-13 DIAGNOSIS — I82409 Acute embolism and thrombosis of unspecified deep veins of unspecified lower extremity: Secondary | ICD-10-CM

## 2011-08-13 NOTE — Patient Instructions (Signed)
  Latest dosing instructions   Total Sun Mon Tue Wed Thu Fri Sat   25 2.5 mg 5 mg 2.5 mg 2.5 mg 5 mg 5 mg 2.5 mg    (5 mg0.5) (5 mg1) (5 mg0.5) (5 mg0.5) (5 mg1) (5 mg1) (5 mg0.5)        

## 2011-09-13 ENCOUNTER — Encounter: Payer: No Typology Code available for payment source | Admitting: Family

## 2011-09-14 ENCOUNTER — Ambulatory Visit (INDEPENDENT_AMBULATORY_CARE_PROVIDER_SITE_OTHER): Payer: Medicare Other | Admitting: Family

## 2011-09-14 DIAGNOSIS — I82409 Acute embolism and thrombosis of unspecified deep veins of unspecified lower extremity: Secondary | ICD-10-CM | POA: Diagnosis not present

## 2011-09-14 MED ORDER — WARFARIN SODIUM 5 MG PO TABS: 5.0000 mg | ORAL_TABLET | Freq: Every day | ORAL | Status: DC

## 2011-09-14 NOTE — Patient Instructions (Signed)
Take 5mg . Today only. Then 2.5mg  on mondays, Wednesday and fridays. 5mg  on all other days Check in 2 weeks    Latest dosing instructions   Total Sun Mon Tue Wed Thu Fri Sat   27.5 5 mg 2.5 mg 5 mg 2.5 mg 5 mg 2.5 mg 5 mg    (5 mg1) (5 mg0.5) (5 mg1) (5 mg0.5) (5 mg1) (5 mg0.5) (5 mg1)    Alt Week 27.5 5 mg 2.5 mg 5 mg 2.5 mg 5 mg 2.5 mg 5 mg    (5 mg1) (5 mg0.5) (5 mg1) (5 mg0.5) (5 mg1) (5 mg0.5) (5 mg1)

## 2011-09-26 ENCOUNTER — Other Ambulatory Visit: Payer: Self-pay | Admitting: Internal Medicine

## 2011-09-27 ENCOUNTER — Ambulatory Visit (INDEPENDENT_AMBULATORY_CARE_PROVIDER_SITE_OTHER): Payer: Medicare Other | Admitting: Family

## 2011-09-27 DIAGNOSIS — I82409 Acute embolism and thrombosis of unspecified deep veins of unspecified lower extremity: Secondary | ICD-10-CM

## 2011-09-27 NOTE — Patient Instructions (Signed)
  Latest dosing instructions   Total Sun Mon Tue Wed Thu Fri Sat   27.5 5 mg 2.5 mg 5 mg 2.5 mg 5 mg 2.5 mg 5 mg    (5 mg1) (5 mg0.5) (5 mg1) (5 mg0.5) (5 mg1) (5 mg0.5) (5 mg1)        

## 2011-10-17 ENCOUNTER — Ambulatory Visit (INDEPENDENT_AMBULATORY_CARE_PROVIDER_SITE_OTHER): Payer: Medicare Other | Admitting: Family

## 2011-10-17 DIAGNOSIS — I82409 Acute embolism and thrombosis of unspecified deep veins of unspecified lower extremity: Secondary | ICD-10-CM

## 2011-10-17 LAB — POCT INR: INR: 4

## 2011-10-17 NOTE — Patient Instructions (Addendum)
Hold coumadin Thursday and Friday. Resume Coumadin on Sat.  Monday, Wednesday and Friday 1/2 tab (2.5mg ).  All other days 5mg . Check in 3 weeks    Latest dosing instructions   Total Sun Mon Tue Wed Thu Fri Sat   27.5 5 mg 2.5 mg 5 mg 2.5 mg 5 mg 2.5 mg 5 mg    (5 mg1) (5 mg0.5) (5 mg1) (5 mg0.5) (5 mg1) (5 mg0.5) (5 mg1)

## 2011-11-01 ENCOUNTER — Other Ambulatory Visit: Payer: Self-pay | Admitting: Internal Medicine

## 2011-11-02 ENCOUNTER — Ambulatory Visit (INDEPENDENT_AMBULATORY_CARE_PROVIDER_SITE_OTHER): Payer: Medicare Other | Admitting: Internal Medicine

## 2011-11-02 ENCOUNTER — Ambulatory Visit (INDEPENDENT_AMBULATORY_CARE_PROVIDER_SITE_OTHER): Payer: Medicare Other | Admitting: Family

## 2011-11-02 ENCOUNTER — Encounter: Payer: Self-pay | Admitting: Internal Medicine

## 2011-11-02 VITALS — BP 140/90 | Temp 98.2°F | Wt 179.0 lb

## 2011-11-02 DIAGNOSIS — I10 Essential (primary) hypertension: Secondary | ICD-10-CM

## 2011-11-02 DIAGNOSIS — E785 Hyperlipidemia, unspecified: Secondary | ICD-10-CM | POA: Diagnosis not present

## 2011-11-02 DIAGNOSIS — I82409 Acute embolism and thrombosis of unspecified deep veins of unspecified lower extremity: Secondary | ICD-10-CM | POA: Diagnosis not present

## 2011-11-02 DIAGNOSIS — E119 Type 2 diabetes mellitus without complications: Secondary | ICD-10-CM

## 2011-11-02 DIAGNOSIS — N259 Disorder resulting from impaired renal tubular function, unspecified: Secondary | ICD-10-CM

## 2011-11-02 DIAGNOSIS — I2699 Other pulmonary embolism without acute cor pulmonale: Secondary | ICD-10-CM | POA: Diagnosis not present

## 2011-11-02 LAB — LIPID PANEL
Cholesterol: 233 mg/dL — ABNORMAL HIGH (ref 0–200)
Total CHOL/HDL Ratio: 4
Triglycerides: 187 mg/dL — ABNORMAL HIGH (ref 0.0–149.0)
VLDL: 37.4 mg/dL (ref 0.0–40.0)

## 2011-11-02 LAB — HEPATIC FUNCTION PANEL
ALT: 10 U/L (ref 0–53)
AST: 21 U/L (ref 0–37)
Bilirubin, Direct: 0 mg/dL (ref 0.0–0.3)
Total Bilirubin: 0.3 mg/dL (ref 0.3–1.2)

## 2011-11-02 LAB — HEMOGLOBIN A1C: Hgb A1c MFr Bld: 7.8 % — ABNORMAL HIGH (ref 4.6–6.5)

## 2011-11-02 LAB — BASIC METABOLIC PANEL
Chloride: 110 mEq/L (ref 96–112)
GFR: 33.09 mL/min — ABNORMAL LOW (ref >60.00)
Potassium: 4.4 mEq/L (ref 3.5–5.1)

## 2011-11-02 LAB — POCT INR: INR: 2.7

## 2011-11-02 LAB — LDL CHOLESTEROL, DIRECT: Direct LDL: 124.7 mg/dL

## 2011-11-02 NOTE — Patient Instructions (Signed)
Monday, Wednesday and Friday 1/2 tab (2.5mg ).  All other days 5mg . Check in 4 weeks    Latest dosing instructions   Total Sun Mon Tue Wed Thu Fri Sat   27.5 5 mg 2.5 mg 5 mg 2.5 mg 5 mg 2.5 mg 5 mg    (5 mg1) (5 mg0.5) (5 mg1) (5 mg0.5) (5 mg1) (5 mg0.5) (5 mg1)

## 2011-11-05 ENCOUNTER — Encounter: Payer: No Typology Code available for payment source | Admitting: Family

## 2011-11-05 ENCOUNTER — Ambulatory Visit: Payer: No Typology Code available for payment source | Admitting: Internal Medicine

## 2011-11-07 ENCOUNTER — Other Ambulatory Visit: Payer: Self-pay | Admitting: *Deleted

## 2011-11-07 DIAGNOSIS — N289 Disorder of kidney and ureter, unspecified: Secondary | ICD-10-CM

## 2011-11-07 MED ORDER — ATORVASTATIN CALCIUM 10 MG PO TABS: 5.0000 mg | ORAL_TABLET | Freq: Every day | ORAL | Status: DC

## 2011-11-07 NOTE — Assessment & Plan Note (Signed)
Needs f/u.

## 2011-11-07 NOTE — Assessment & Plan Note (Signed)
Not controlled  next step will  Be insulin I'd like him to control with diet and exercise

## 2011-11-07 NOTE — Progress Notes (Signed)
Patient ID: [name], male   DOB: 10/09/1940, 71 y.o.   MRN: [id number]  patient comes in for followup of multiple medical problems including type 2 diabetes, hyperlipidemia, hypertension. The patient does not check blood sugar or blood pressure at home. The patetient does not follow an exercise or diet program. The patient denies any polyuria, polydipsia.  In the past the patient has gone to diabetic treatment center. The patient is tolerating medications  Without difficulty. The patient does admit to medication compliance.   Has been treated for PE for one year--need to consider dc warfarin  Past Medical History  Diagnosis Date  . Hypertension   . Diabetes mellitus   . Hyperlipidemia   . Prostate cancer   . Renal insufficiency   . Cataract     History   Social History  . Marital Status: Married    Spouse Name: N/A    Number of Children: N/A  . Years of Education: N/A   Occupational History  . Not on file.   Social History Main Topics  . Smoking status: Former Games developer  . Smokeless tobacco: Not on file  . Alcohol Use:   . Drug Use:   . Sexually Active:    Other Topics Concern  . Not on file   Social History Narrative  . No narrative on file    Past Surgical History  Procedure Date  . Prostate surgery     radiation therapy only    No family history on file.  No Known Allergies  Current Outpatient Prescriptions on File Prior to Visit  Medication Sig Dispense Refill  . calcium-vitamin D 500 MG tablet Take 1 tablet by mouth 2 (two) times daily.        Marland Kitchen glyBURIDE (DIABETA) 5 MG tablet TAKE 1 TABLET BY MOUTH EVERY DAY  90 tablet  2  . lisinopril-hydrochlorothiazide (PRINZIDE,ZESTORETIC) 20-12.5 MG per tablet TAKE 2 TABLETS BY MOUTH DAILY.  180 tablet  0  . potassium chloride SA (KLOR-CON M20) 20 MEQ tablet Take 1 tablet (20 mEq total) by mouth daily.  90 tablet  3  . TRADJENTA 5 MG TABS tablet TAKE 1 TABLET BY MOUTH DAILY  90 tablet  1     patient denies  chest pain, shortness of breath, orthopnea. Denies lower extremity edema, abdominal pain, change in appetite, change in bowel movements. Patient denies rashes, musculoskeletal complaints. No other specific complaints in a complete review of systems.   BP 140/90  Temp 98.2 F (36.8 C) (Oral)  Wt 179 lb (81.194 kg)  well-developed well-nourished male in no acute distress. HEENT exam atraumatic, normocephalic, neck supple without jugular venous distention. Chest clear to auscultation cardiac exam S1-S2 are regular. Abdominal exam overweight with bowel sounds, soft and nontender. Extremities no edema. Neurologic exam is alert with a normal gait.

## 2011-11-07 NOTE — Assessment & Plan Note (Signed)
Discussed PE and treatment He has bee treated for one year Will d/c warfarin

## 2011-12-03 ENCOUNTER — Ambulatory Visit: Payer: Self-pay | Admitting: Family

## 2011-12-03 DIAGNOSIS — I82409 Acute embolism and thrombosis of unspecified deep veins of unspecified lower extremity: Secondary | ICD-10-CM

## 2011-12-03 NOTE — Patient Instructions (Signed)
Coumadin D/C by Dr. Cato Mulligan

## 2011-12-04 ENCOUNTER — Encounter: Payer: No Typology Code available for payment source | Admitting: Family

## 2011-12-25 ENCOUNTER — Other Ambulatory Visit: Payer: Self-pay | Admitting: Internal Medicine

## 2012-01-09 ENCOUNTER — Other Ambulatory Visit: Payer: Self-pay | Admitting: Internal Medicine

## 2012-01-16 ENCOUNTER — Other Ambulatory Visit: Payer: Self-pay | Admitting: Internal Medicine

## 2012-01-31 ENCOUNTER — Telehealth: Payer: Self-pay | Admitting: Internal Medicine

## 2012-01-31 MED ORDER — ATORVASTATIN CALCIUM 10 MG PO TABS: 5.0000 mg | ORAL_TABLET | Freq: Every day | ORAL | Status: DC

## 2012-01-31 NOTE — Telephone Encounter (Signed)
rx sent in electronically 

## 2012-01-31 NOTE — Telephone Encounter (Signed)
Patient called stating that he need a refill of his atorvastatin sent in for 90day refill per his pharmacy to [facility]. Please assist.

## 2012-03-24 ENCOUNTER — Other Ambulatory Visit: Payer: Self-pay | Admitting: Internal Medicine

## 2012-05-08 ENCOUNTER — Other Ambulatory Visit: Payer: Self-pay | Admitting: Internal Medicine

## 2012-06-24 DIAGNOSIS — E559 Vitamin D deficiency, unspecified: Secondary | ICD-10-CM | POA: Diagnosis not present

## 2012-06-24 DIAGNOSIS — C61 Malignant neoplasm of prostate: Secondary | ICD-10-CM | POA: Diagnosis not present

## 2012-06-25 ENCOUNTER — Other Ambulatory Visit: Payer: Self-pay | Admitting: Internal Medicine

## 2012-07-01 DIAGNOSIS — C61 Malignant neoplasm of prostate: Secondary | ICD-10-CM | POA: Diagnosis not present

## 2012-07-16 ENCOUNTER — Other Ambulatory Visit: Payer: Self-pay | Admitting: Internal Medicine

## 2012-09-22 ENCOUNTER — Other Ambulatory Visit: Payer: Self-pay | Admitting: Internal Medicine

## 2012-09-23 ENCOUNTER — Other Ambulatory Visit: Payer: Self-pay | Admitting: Internal Medicine

## 2012-10-10 ENCOUNTER — Other Ambulatory Visit: Payer: Self-pay | Admitting: Internal Medicine

## 2012-11-03 ENCOUNTER — Other Ambulatory Visit: Payer: Self-pay | Admitting: Internal Medicine

## 2012-11-06 DIAGNOSIS — I129 Hypertensive chronic kidney disease with stage 1 through stage 4 chronic kidney disease, or unspecified chronic kidney disease: Secondary | ICD-10-CM | POA: Diagnosis not present

## 2012-11-06 DIAGNOSIS — I10 Essential (primary) hypertension: Secondary | ICD-10-CM | POA: Diagnosis not present

## 2013-01-19 ENCOUNTER — Other Ambulatory Visit: Payer: Self-pay | Admitting: Internal Medicine

## 2013-01-19 MED ORDER — ATORVASTATIN CALCIUM 10 MG PO TABS: 5.0000 mg | ORAL_TABLET | Freq: Every day | ORAL | Status: DC

## 2013-01-19 NOTE — Telephone Encounter (Signed)
rx sent in electronically 

## 2013-01-19 NOTE — Telephone Encounter (Signed)
Pt has appt sch for 03-13-13

## 2013-01-19 NOTE — Addendum Note (Signed)
Addended by: Alfred Levins D on: 01/19/2013 11:11 AM   Modules accepted: Orders

## 2013-01-30 DIAGNOSIS — I10 Essential (primary) hypertension: Secondary | ICD-10-CM | POA: Diagnosis not present

## 2013-02-02 ENCOUNTER — Other Ambulatory Visit: Payer: Self-pay | Admitting: Internal Medicine

## 2013-02-07 LAB — HM DIABETES EYE EXAM

## 2013-03-13 ENCOUNTER — Ambulatory Visit (INDEPENDENT_AMBULATORY_CARE_PROVIDER_SITE_OTHER): Payer: No Typology Code available for payment source | Admitting: Internal Medicine

## 2013-03-13 ENCOUNTER — Encounter: Payer: Self-pay | Admitting: Internal Medicine

## 2013-03-13 VITALS — BP 144/90 | HR 64 | Temp 98.5°F | Ht 68.0 in | Wt 169.0 lb

## 2013-03-13 DIAGNOSIS — I2699 Other pulmonary embolism without acute cor pulmonale: Secondary | ICD-10-CM

## 2013-03-13 DIAGNOSIS — E1159 Type 2 diabetes mellitus with other circulatory complications: Secondary | ICD-10-CM

## 2013-03-13 DIAGNOSIS — Z23 Encounter for immunization: Secondary | ICD-10-CM

## 2013-03-13 DIAGNOSIS — E785 Hyperlipidemia, unspecified: Secondary | ICD-10-CM | POA: Diagnosis not present

## 2013-03-13 DIAGNOSIS — I10 Essential (primary) hypertension: Secondary | ICD-10-CM | POA: Diagnosis not present

## 2013-03-13 DIAGNOSIS — E119 Type 2 diabetes mellitus without complications: Secondary | ICD-10-CM | POA: Diagnosis not present

## 2013-03-13 DIAGNOSIS — N259 Disorder resulting from impaired renal tubular function, unspecified: Secondary | ICD-10-CM

## 2013-03-13 LAB — BASIC METABOLIC PANEL
BUN: 19 mg/dL (ref 6–23)
Calcium: 9.4 mg/dL (ref 8.4–10.5)
GFR: 30.81 mL/min — ABNORMAL LOW (ref >60.00)
Glucose, Bld: 127 mg/dL — ABNORMAL HIGH (ref 70–99)
Potassium: 3.6 mEq/L (ref 3.5–5.1)
Sodium: 140 mEq/L (ref 135–145)

## 2013-03-13 LAB — LIPID PANEL
HDL: 45.6 mg/dL (ref >39.00)
LDL Cholesterol: 106 mg/dL — ABNORMAL HIGH (ref 0–99)
Total CHOL/HDL Ratio: 4
Triglycerides: 160 mg/dL — ABNORMAL HIGH (ref 0.0–149.0)

## 2013-03-13 LAB — CBC
HCT: 31.9 % — ABNORMAL LOW (ref 39.0–52.0)
Hemoglobin: 10.4 g/dL — ABNORMAL LOW (ref 13.0–17.0)
MCHC: 32.7 g/dL (ref 30.0–36.0)
RDW: 15 % — ABNORMAL HIGH (ref 11.5–14.6)

## 2013-03-13 LAB — HEPATIC FUNCTION PANEL
Bilirubin, Direct: 0 mg/dL (ref 0.0–0.3)
Total Bilirubin: 0.5 mg/dL (ref 0.3–1.2)
Total Protein: 6.7 g/dL (ref 6.0–8.3)

## 2013-03-13 LAB — MICROALBUMIN / CREATININE URINE RATIO: Microalb, Ur: 0.8 mg/dL (ref 0.0–1.9)

## 2013-03-13 NOTE — Progress Notes (Signed)
Dm- eats a lot of candy Does not check cbgs No polyuria, polydipsia Checks feet at home  Lipids- currently tolerating meds without difficulty  htn- no home bps  Past Medical History  Diagnosis Date  . Hypertension   . Diabetes mellitus   . Hyperlipidemia   . Prostate cancer   . Renal insufficiency   . Cataract     History   Social History  . Marital Status: Married    Spouse Name: N/A    Number of Children: N/A  . Years of Education: N/A   Occupational History  . Not on file.   Social History Main Topics  . Smoking status: Former Games developer  . Smokeless tobacco: Not on file  . Alcohol Use:   . Drug Use:   . Sexual Activity:    Other Topics Concern  . Not on file   Social History Narrative  . No narrative on file    Past Surgical History  Procedure Laterality Date  . Prostate surgery      radiation therapy only    No family history on file.  No Known Allergies  Current Outpatient Prescriptions on File Prior to Visit  Medication Sig Dispense Refill  . atorvastatin (LIPITOR) 10 MG tablet Take 0.5 tablets (5 mg total) by mouth daily.  90 tablet  0  . calcium-vitamin D 500 MG tablet Take 1 tablet by mouth 2 (two) times daily.        Marland Kitchen glyBURIDE (DIABETA) 5 MG tablet TAKE 1 TABLET BY MOUTH EVERY DAY  90 tablet  0  . KLOR-CON M20 20 MEQ tablet TAKE 1 TABLET (20 MEQ TOTAL) BY MOUTH DAILY.  90 tablet  3  . lisinopril-hydrochlorothiazide (PRINZIDE,ZESTORETIC) 20-12.5 MG per tablet TAKE 2 TABLETS BY MOUTH DAILY  180 tablet  1  . TRADJENTA 5 MG TABS tablet TAKE 1 TABLET BY MOUTH EVERY DAY  90 tablet  0   No current facility-administered medications on file prior to visit.     patient denies chest pain, shortness of breath, orthopnea. Denies lower extremity edema, abdominal pain, change in appetite, change in bowel movements. Patient denies rashes, musculoskeletal complaints. No other specific complaints in a complete review of systems.   BP 144/90  Pulse 64   Temp(Src) 98.5 F (36.9 C) (Oral)  Ht 5\' 8"  (1.727 m)  Wt 169 lb (76.658 kg)  BMI 25.70 kg/m2  well-developed well-nourished male in no acute distress. HEENT exam atraumatic, normocephalic, neck supple without jugular venous distention. Chest clear to auscultation cardiac exam S1-S2 are regular. Abdominal exam overweight with bowel sounds, soft and nontender. Extremities no edema. Neurologic exam is alert with a normal gait.

## 2013-03-13 NOTE — Progress Notes (Signed)
Pre visit review using our clinic review tool, if applicable. No additional management support is needed unless otherwise documented below in the visit note. 

## 2013-03-15 NOTE — Assessment & Plan Note (Signed)
No sxs No recurrence Off warfarin

## 2013-03-15 NOTE — Assessment & Plan Note (Signed)
Check labs today.

## 2013-03-15 NOTE — Assessment & Plan Note (Signed)
BP Readings from Last 3 Encounters:  03/13/13 144/90  11/02/11 140/90  05/07/11 140/82  fair control Continue meds

## 2013-03-15 NOTE — Assessment & Plan Note (Signed)
Check labs today Request notes from nephrology

## 2013-03-17 ENCOUNTER — Other Ambulatory Visit: Payer: Self-pay | Admitting: Internal Medicine

## 2013-03-17 DIAGNOSIS — E119 Type 2 diabetes mellitus without complications: Secondary | ICD-10-CM

## 2013-03-18 ENCOUNTER — Other Ambulatory Visit: Payer: Self-pay | Admitting: Internal Medicine

## 2013-03-26 ENCOUNTER — Ambulatory Visit: Payer: No Typology Code available for payment source | Admitting: Endocrinology

## 2013-04-10 ENCOUNTER — Ambulatory Visit (INDEPENDENT_AMBULATORY_CARE_PROVIDER_SITE_OTHER): Payer: Medicare Other | Admitting: Endocrinology

## 2013-04-10 ENCOUNTER — Other Ambulatory Visit: Payer: Self-pay | Admitting: *Deleted

## 2013-04-10 ENCOUNTER — Encounter: Payer: Self-pay | Admitting: Endocrinology

## 2013-04-10 VITALS — BP 102/60 | HR 73 | Temp 98.0°F | Ht 68.0 in | Wt 164.8 lb

## 2013-04-10 DIAGNOSIS — E1165 Type 2 diabetes mellitus with hyperglycemia: Principal | ICD-10-CM

## 2013-04-10 DIAGNOSIS — IMO0001 Reserved for inherently not codable concepts without codable children: Secondary | ICD-10-CM | POA: Diagnosis not present

## 2013-04-10 DIAGNOSIS — N184 Chronic kidney disease, stage 4 (severe): Secondary | ICD-10-CM

## 2013-04-10 DIAGNOSIS — I10 Essential (primary) hypertension: Secondary | ICD-10-CM | POA: Diagnosis not present

## 2013-04-10 MED ORDER — GLUCOSE BLOOD VI STRP: ORAL_STRIP | Status: DC

## 2013-04-10 MED ORDER — ACCU-CHEK FASTCLIX LANCETS MISC: Status: DC

## 2013-04-10 MED ORDER — ONETOUCH ULTRA SYSTEM W/DEVICE KIT: 1.0000 | PACK | Freq: Once | Status: DC

## 2013-04-10 MED ORDER — ONETOUCH ULTRASOFT LANCETS MISC: Status: DC

## 2013-04-10 MED ORDER — PIOGLITAZONE HCL 30 MG PO TABS: 30.0000 mg | ORAL_TABLET | Freq: Every day | ORAL | Status: DC

## 2013-04-10 MED ORDER — GLIPIZIDE ER 5 MG PO TB24: 5.0000 mg | ORAL_TABLET | Freq: Every day | ORAL | Status: DC

## 2013-04-10 NOTE — Progress Notes (Signed)
Goals (1 Years of Data) as of 04/10/13   None

## 2013-04-10 NOTE — Telephone Encounter (Signed)
[name] from [facility] called and stated the pt's insurance will not cover the Accu-Chek, but will cover the One Touch Ultra. Advised [place] to send an rx for a meter, lancets and strips to their pharmacy for him.

## 2013-04-10 NOTE — Patient Instructions (Addendum)
Switch to Diet Pepsi; reduce sweets and sweet tea  Please check blood sugars at least half the time about 2 hours after any meal and 2x a week on waking up. Please bring blood sugar monitor to each visit  STOP Glyburide AND CHANGE TO Glipizide ER 5mg  daily and Actos 30mg  daily

## 2013-04-10 NOTE — Progress Notes (Signed)
Patient ID: [name], male   DOB: [birth year], 73 y.o.   MRN: [id number]   Reason for Appointment : Consultation for Type 2 Diabetes  History of Present Illness          Diagnosis: Type 2 diabetes mellitus, date of diagnosis:   2001     Past history: He has had long-standing diabetes treated with various oral hypoglycemic drugs. Detailed history is not available He has not been on metformin because of renal dysfunction He thinks he was given Actos for sometime but not clear if he was benefiting. This was stopped probably in 2012 but he does not think he had any side effects. He thinks he has been taking Tradjenta for a couple of years  His A1c has been monitored about once or twice a year as per records   Recent history:  Because of worsening A1c in 12/14 he was referred for further evaluation. He has not checked his blood sugar in a few years However glucose in the lab fasting was 127 He admits he has not been following his diet especially with drinking regular soft drinks and tea with sugar as well as eating candy. Eats a half a gallon of ice cream a day and also goes to fast food restaurants at least once a week. Likes to eat hot dogs and hamburgers       Oral hypoglycemic drugs the patient is taking are: Tradjenta, glyburide        Glucose monitoring:  not done Hypoglycemia: None   Self-care: The diet that the patient has been following is: None, see above     Meals: 3 meals per day. Coke 2-3 daily recently, ice cream candy         Exercise: none         Dietician visit: Most recent: At the time of diagnosis only              Retinal exam: Most recent: 3 years ago. Weight history:  Wt Readings from Last 3 Encounters:  04/10/13 164 lb 12.8 oz (74.753 kg)  03/13/13 169 lb (76.658 kg)  11/02/11 179 lb (81.194 kg)   Glycemic control:  Lab Results  Component Value Date   HGBA1C 8.6* 03/13/2013   HGBA1C 7.8* 11/02/2011   HGBA1C 7.3* 04/30/2011   Lab Results  Component  Value Date   MICROALBUR 0.8 03/13/2013   LDLCALC 106* 03/13/2013   CREATININE 2.6* 03/13/2013          Medication List       This list is accurate as of: 04/10/13  9:44 AM.  Always use your most recent med list.               atenolol 100 MG tablet  Commonly known as:  TENORMIN  Take 1 tablet by mouth daily.     atorvastatin 10 MG tablet  Commonly known as:  LIPITOR  Take 0.5 tablets (5 mg total) by mouth daily.     calcium-vitamin D 500 MG tablet  Take 1 tablet by mouth 2 (two) times daily.     furosemide 40 MG tablet  Commonly known as:  LASIX  Take 1 tablet by mouth daily.     glyBURIDE 5 MG tablet  Commonly known as:  DIABETA  TAKE 1 TABLET BY MOUTH EVERY DAY     lisinopril-hydrochlorothiazide 20-12.5 MG per tablet  Commonly known as:  PRINZIDE,ZESTORETIC  TAKE 2 TABLETS BY MOUTH DAILY     potassium chloride SA  20 MEQ tablet  Commonly known as:  KLOR-CON M20  Take 1 tablet by mouth daily     TRADJENTA 5 MG Tabs tablet  Generic drug:  linagliptin  TAKE 1 TABLET BY MOUTH EVERY DAY        Allergies: No Known Allergies  Past Medical History  Diagnosis Date  . Hypertension   . Diabetes mellitus   . Hyperlipidemia   . Prostate cancer   . Renal insufficiency   . Cataract     Past Surgical History  Procedure Laterality Date  . Prostate surgery      radiation therapy only    History reviewed. No pertinent family history.  Social History:  reports that he has quit smoking. He does not have any smokeless tobacco history on file. His alcohol and drug histories are not on file.    Review of Systems       Lipids: Currently taking 10 mg Lipitor, on medication for sometime. 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 He has  been told that he has a cataract since he was young                Skin: No rash or infections     Thyroid:  No  unusual fatigue.     The blood pressure has been Controlled with medications for several years       No swelling of feet recently.     No shortness of breath or chest discomfort on exertion.     Bowel habits: Normal.       No frequency of urination or nocturia       No joint  pains.         No history of Numbness, tingling or burning pain in  feet     LABS:  No visits with results within 1 Week(s) from this visit. Latest known visit with results is:  Office Visit on 03/13/2013  Component Date Value Range Status  . Total Bilirubin 03/13/2013 0.5  0.3 - 1.2 mg/dL Final  . Bilirubin, Direct 03/13/2013 0.0  0.0 - 0.3 mg/dL Final  . Alkaline Phosphatase 03/13/2013 85  39 - 117 U/L Final  . AST 03/13/2013 18  0 - 37 U/L Final  . ALT 03/13/2013 11  0 - 53 U/L Final  . Total Protein 03/13/2013 6.7  6.0 - 8.3 g/dL Final  . Albumin 80/22/3361 3.4* 3.5 - 5.2 g/dL Final  . Cholesterol 22/44/9753 184  0 - 200 mg/dL Final   ATP III Classification       Desirable:  < 200 mg/dL               Borderline High:  200 - 239 mg/dL          High:  > = 005 mg/dL  . Triglycerides 03/13/2013 160.0* 0.0 - 149.0 mg/dL Final   Normal:  <110 mg/dLBorderline High:  150 - 199 mg/dL  . HDL 03/13/2013 45.60  >39.00 mg/dL Final  . VLDL 21/02/7355 32.0  0.0 - 40.0 mg/dL Final  . LDL Cholesterol 03/13/2013 106* 0 - 99 mg/dL Final  . Total CHOL/HDL Ratio 03/13/2013 4   Final  Men          Women1/2 Average Risk     3.4          3.3Average Risk          5.0          4.42X Average Risk          9.6          7.13X Average Risk          15.0          11.0                      . Sodium 03/13/2013 140  135 - 145 mEq/L Final  . Potassium 03/13/2013 3.6  3.5 - 5.1 mEq/L Final  . Chloride 03/13/2013 104  96 - 112 mEq/L Final  . CO2 03/13/2013 27  19 - 32 mEq/L Final  . Glucose, Bld  03/13/2013 127* 70 - 99 mg/dL Final  . BUN 03/13/2013 19  6 - 23 mg/dL Final  . Creatinine, Ser 03/13/2013 2.6* 0.4 - 1.5 mg/dL Final  . Calcium 03/13/2013 9.4  8.4 - 10.5 mg/dL Final  . GFR 03/13/2013 30.81* >60.00 mL/min Final  . HM Diabetic Eye Exam 02/07/2013 dr Delman Cheadle   Final  . HM Diabetic Foot Exam 03/13/2013 done   Final  . Hemoglobin A1C 03/13/2013 8.6* 4.6 - 6.5 % Final   Glycemic Control Guidelines for People with Diabetes:Non Diabetic:  <6%Goal of Therapy: <7%Additional Action Suggested:  >8%   . WBC 03/13/2013 7.5  4.5 - 10.5 K/uL Final  . RBC 03/13/2013 3.62* 4.22 - 5.81 Mil/uL Final  . Platelets 03/13/2013 226.0  150.0 - 400.0 K/uL Final  . Hemoglobin 03/13/2013 10.4* 13.0 - 17.0 g/dL Final  . HCT 03/13/2013 31.9* 39.0 - 52.0 % Final  . MCV 03/13/2013 88.1  78.0 - 100.0 fl Final  . MCHC 03/13/2013 32.7  30.0 - 36.0 g/dL Final  . RDW 03/13/2013 15.0* 11.5 - 14.6 % Final  . Microalb, Ur 03/13/2013 0.8  0.0 - 1.9 mg/dL Final  . Creatinine,U 03/13/2013 334.2   Final  . Microalb Creat Ratio 03/13/2013 0.2  0.0 - 30.0 mg/g Final    Physical Examination:  BP 102/60  Pulse 73  Temp(Src) 98 F (36.7 C) (Oral)  Ht 5\' 8"  (1.727 m)  Wt 164 lb 12.8 oz (74.753 kg)  BMI 25.06 kg/m2  SpO2 98%  GENERAL:         Patient is well built and nourished HEENT:         Eye exam shows left corneal opacity. Fundus exam on the right shows no retinopathy.  Oral exam shows normal mucosa .  NECK:  there is no lymphadenopathy. Carotids are normal to palpation and no bruit heard. Thyroid is not enlarged and no nodules felt.   LUNGS:         Chest is symmetrical. Lungs are clear to auscultation.Marland Kitchen   HEART:         Heart sounds:  S1 and S2 are normal. No murmurs or clicks heard., no S3 or S4.   ABDOMEN:   There is no distention present. Liver and spleen are not palpable. No other mass or tenderness present.  EXTREMITIES:     There is no edema. No skin lesions present.Marland Kitchen  NEUROLOGICAL:         Vibration sense is mildly reduced in toes. Ankle jerks are 1+ bilaterally.  Diabetic foot exam:  as in the foot exam section MUSCULOSKELETAL:       There is no enlargement or deformity of the joints. Spine is normal to inspection.Marland Kitchen   PEDAL pulses: Normal SKIN:       No rash or lesions of concern.        ASSESSMENT:  Diabetes type 2, uncontrolled with A1c 8.6%. He is not obese and may be insulin deficient since he has had diabetes for about 14 years Currently has limited options for treatment because of his renal failure and is taking Tradjenta and DiaBeta He is potentially at risk for prolonged hypoglycemia with taking DiaBeta Most of his blood sugars are probably high after meals since fasting reading was fairly good in the lab and his diet is poor   Problems identified:   Limited knowledge of diabetes and self-care.  Not monitoring blood sugar at home  Poor diet significant amount of high-fat foods as well as simple carbohydrates in the form of drinks and sweets  Inactivity  Low motivation to improve his diabetes control  Complications: None evident, not clear of the etiology of his renal insufficiency  HYPERTENSION: Well controlled, blood pressure low normal today  Hyperlipidemia: Currently only on 10 mg Lipitor. LDL still over 100. Because of his diabetes, age and renal insufficiency he should be on at least 20 mg Lipitor for cardiovascular protection  Renal failure GFR about 30 followed by a nephrologist  PLAN:    Stop glyburide sleep  Start glipizide ER 5 mg daily  Start home glucose monitoring. He was instructed on One Touch glucose monitor in the office today  Trial of pioglitazone 30 mg daily in addition to glipizide and Tradjenta  Discussed importance of significantly improving his diet and cutting out excess carbohydrate and fat. He will switch to diet drinks  Consultation with nutritionist and diabetes educator  Review blood sugars in one  month  Start walking regularly for exercise  Total visit time including counseling = 45 minutes  [name] 04/10/2013, 9:44 AM

## 2013-05-08 ENCOUNTER — Other Ambulatory Visit (INDEPENDENT_AMBULATORY_CARE_PROVIDER_SITE_OTHER): Payer: Medicare Other

## 2013-05-08 DIAGNOSIS — IMO0001 Reserved for inherently not codable concepts without codable children: Secondary | ICD-10-CM | POA: Diagnosis not present

## 2013-05-08 DIAGNOSIS — E1165 Type 2 diabetes mellitus with hyperglycemia: Principal | ICD-10-CM

## 2013-05-08 LAB — GLUCOSE, RANDOM: GLUCOSE: 219 mg/dL — AB (ref 70–99)

## 2013-05-09 LAB — FRUCTOSAMINE: Fructosamine: 310 umol/L — ABNORMAL HIGH (ref <285)

## 2013-05-12 ENCOUNTER — Ambulatory Visit: Payer: No Typology Code available for payment source | Admitting: Endocrinology

## 2013-05-20 ENCOUNTER — Encounter: Payer: Self-pay | Admitting: Endocrinology

## 2013-05-20 ENCOUNTER — Other Ambulatory Visit: Payer: Self-pay | Admitting: *Deleted

## 2013-05-20 ENCOUNTER — Ambulatory Visit (INDEPENDENT_AMBULATORY_CARE_PROVIDER_SITE_OTHER): Payer: Medicare Other | Admitting: Endocrinology

## 2013-05-20 VITALS — BP 110/64 | HR 70 | Temp 97.9°F | Resp 14 | Ht 68.5 in | Wt 154.4 lb

## 2013-05-20 DIAGNOSIS — IMO0001 Reserved for inherently not codable concepts without codable children: Secondary | ICD-10-CM | POA: Diagnosis not present

## 2013-05-20 DIAGNOSIS — E785 Hyperlipidemia, unspecified: Secondary | ICD-10-CM

## 2013-05-20 DIAGNOSIS — E1165 Type 2 diabetes mellitus with hyperglycemia: Principal | ICD-10-CM

## 2013-05-20 MED ORDER — GLIPIZIDE ER 5 MG PO TB24: 5.0000 mg | ORAL_TABLET | Freq: Every day | ORAL | Status: DC

## 2013-05-20 MED ORDER — PIOGLITAZONE HCL 30 MG PO TABS: 30.0000 mg | ORAL_TABLET | Freq: Every day | ORAL | Status: DC

## 2013-05-20 NOTE — Progress Notes (Signed)
Patient ID: [name], male   DOB: 1940/09/27, 73 y.o.   MRN: [id number]   Reason for Appointment : Followup of  Type 2 Diabetes  History of Present Illness          Diagnosis: Type 2 diabetes mellitus, date of diagnosis:   2001     Past history: He has had long-standing diabetes treated with various oral hypoglycemic drugs.  He has not been on metformin because of renal dysfunction He thinks he was given Actos for sometime but not clear if he was benefiting. This was stopped probably in 2012 but he does not think he had any side effects. He thinks he has been taking Tradjenta for a couple of years  His A1c has been monitored about once or twice a year as per records  Because of worsening A1c in 12/14 he was referred for further evaluation.  Recent history:  Because of baseline A1c of 8.6% and his reluctance to start insulin he was started on glipizide ER 5 mg and pioglitazone 30 mg daily in addition to his Tradjenta He was also started on home glucose monitoring with a One Touch monitor He was advised to start watching his diet better but he still drinking some milk shakes and sweet tea, was also advised to reduce high-fat intake He had lost a little weight and blood sugars appear to be improving; was having readings as high as 343 in 1/15  Glucose monitoring:  currently using a One Touch monitor Fasting glucose recently 117-163; no recent nonfasting readings since 05/04/13 Hypoglycemia: None    Self-care: The diet that the patient has been following is: None, see above     Meals: 3 meals per day. No Cokes, has sweet tea, some milkshakes        Exercise: none         Dietician visit:  pending              Retinal exam: Most recent: 3 years ago. Weight history:  Wt Readings from Last 3 Encounters:  05/20/13 154 lb 6.4 oz (70.035 kg)  04/10/13 164 lb 12.8 oz (74.753 kg)  03/13/13 169 lb (76.658 kg)   Glycemic control:  Lab Results  Component Value Date   HGBA1C 8.6*  03/13/2013   HGBA1C 7.8* 11/02/2011   HGBA1C 7.3* 04/30/2011   Lab Results  Component Value Date   MICROALBUR 0.8 03/13/2013   LDLCALC 106* 03/13/2013   CREATININE 2.6* 03/13/2013          Medication List       This list is accurate as of: 05/20/13  2:29 PM.  Always use your most recent med list.               atenolol 100 MG tablet  Commonly known as:  TENORMIN  Take 1 tablet by mouth daily.     atorvastatin 10 MG tablet  Commonly known as:  LIPITOR  Take 0.5 tablets (5 mg total) by mouth daily.     calcium-vitamin D 500 MG tablet  Take 1 tablet by mouth 2 (two) times daily.     furosemide 40 MG tablet  Commonly known as:  LASIX  Take 1 tablet by mouth daily.     glipiZIDE 5 MG 24 hr tablet  Commonly known as:  GLUCOTROL XL  Take 1 tablet (5 mg total) by mouth daily with breakfast.     glucose blood test strip  Commonly known as:  ONE TOUCH ULTRA TEST  Test blood sugar 3 times a day after each meal and 2 times a week in the morning as instructed. Dx code: 250.00     lisinopril-hydrochlorothiazide 20-12.5 MG per tablet  Commonly known as:  PRINZIDE,ZESTORETIC  TAKE 2 TABLETS BY MOUTH DAILY     ONE TOUCH ULTRA SYSTEM KIT W/DEVICE Kit  1 kit by Does not apply route once.     onetouch ultrasoft lancets  Test blood sugar 3 times a day after each meal and 2 times a week in the morning as instructed. Dx code: 250.00     pioglitazone 30 MG tablet  Commonly known as:  ACTOS  Take 30 mg by mouth daily. Pt wants 90 day supply on all medicine     potassium chloride SA 20 MEQ tablet  Commonly known as:  KLOR-CON M20  Take 1 tablet by mouth daily     TRADJENTA 5 MG Tabs tablet  Generic drug:  linagliptin  TAKE 1 TABLET BY MOUTH EVERY DAY        Allergies: No Known Allergies  Past Medical History  Diagnosis Date  . Hypertension   . Diabetes mellitus   . Hyperlipidemia   . Prostate cancer   . Renal insufficiency   . Cataract     Past Surgical History   Procedure Laterality Date  . Prostate surgery      radiation therapy only    Family History  Problem Relation Age of Onset  . Diabetes Father   . Diabetes Sister   . Hypertension Sister   . Hypertension Brother   . Heart disease Neg Hx     Social History:  reports that he has quit smoking. He does not have any smokeless tobacco history on file. His alcohol and drug histories are not on file.    Review of Systems       Lipids: Currently taking 10 mg Lipitor, on medication for sometime. 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 He has been told that he has a cataract since he was young                The blood pressure has been Controlled with medications for several years      LABS:  No visits with results within 1 Week(s) from this visit. Latest known visit with results is:  Appointment on 05/08/2013  Component Date Value Ref Range Status  . Glucose, Bld 05/08/2013 219* 70 - 99 mg/dL Final  . Fructosamine 05/08/2013 310* <285 umol/L Final   Comment:                            Variations in levels of serum proteins (albumin and immunoglobulins)                          may affect fructosamine results.                               Physical Examination:  BP 110/64  Pulse 70  Temp(Src) 97.9 F (36.6 C)  Resp 14  Ht 5' 8.5" (1.74 m)  Wt 154 lb 6.4 oz (70.035 kg)  BMI 23.13 kg/m2  SpO2 92%          ASSESSMENT:  1. Diabetes type 2, currently on regimen of 3 drugs and is tolerating Actos without any edema or weight gain Also appears to be benefiting from glipizide ER without any hypoglycemia Problems identified today:  Inadequate dietary compliance and minimal knowledge of meal planning  Inadequate exercise regimen  Not  checking readings after meals  #2. Renal failure GFR about 30 followed by a nephrologist  PLAN:    Continue same medication  More readings after meals  Improve diet and eliminate simple sugars  More regular exercise  Regular regular eye  exams once glucose is better controlled  Referral to diabetes educator  [facility] 05/20/2013, 2:29 PM

## 2013-05-20 NOTE — Patient Instructions (Signed)
Start walking regularly for exercise  Please check blood sugars at least half the time about 2 hours after any meal and as directed on waking up. Please bring blood sugar monitor to each visit  Reduce sweet tea and milkshakes

## 2013-07-21 ENCOUNTER — Other Ambulatory Visit: Payer: Self-pay | Admitting: Internal Medicine

## 2013-07-21 DIAGNOSIS — C61 Malignant neoplasm of prostate: Secondary | ICD-10-CM | POA: Diagnosis not present

## 2013-07-21 DIAGNOSIS — M949 Disorder of cartilage, unspecified: Secondary | ICD-10-CM | POA: Diagnosis not present

## 2013-07-21 DIAGNOSIS — E559 Vitamin D deficiency, unspecified: Secondary | ICD-10-CM | POA: Diagnosis not present

## 2013-07-21 DIAGNOSIS — M899 Disorder of bone, unspecified: Secondary | ICD-10-CM | POA: Diagnosis not present

## 2013-07-28 DIAGNOSIS — C61 Malignant neoplasm of prostate: Secondary | ICD-10-CM | POA: Diagnosis not present

## 2013-08-03 DIAGNOSIS — I10 Essential (primary) hypertension: Secondary | ICD-10-CM | POA: Diagnosis not present

## 2013-08-03 DIAGNOSIS — N183 Chronic kidney disease, stage 3 unspecified: Secondary | ICD-10-CM | POA: Diagnosis not present

## 2013-08-12 ENCOUNTER — Other Ambulatory Visit: Payer: Self-pay | Admitting: Endocrinology

## 2013-08-12 ENCOUNTER — Other Ambulatory Visit (INDEPENDENT_AMBULATORY_CARE_PROVIDER_SITE_OTHER): Payer: Medicare Other

## 2013-08-12 DIAGNOSIS — N259 Disorder resulting from impaired renal tubular function, unspecified: Secondary | ICD-10-CM | POA: Diagnosis not present

## 2013-08-12 DIAGNOSIS — E1165 Type 2 diabetes mellitus with hyperglycemia: Principal | ICD-10-CM

## 2013-08-12 DIAGNOSIS — IMO0001 Reserved for inherently not codable concepts without codable children: Secondary | ICD-10-CM | POA: Diagnosis not present

## 2013-08-12 LAB — LIPID PANEL
Cholesterol: 175 mg/dL (ref 0–200)
HDL: 53.1 mg/dL (ref >39.00)
LDL Cholesterol: 98 mg/dL (ref 0–99)
TRIGLYCERIDES: 121 mg/dL (ref 0.0–149.0)
Total CHOL/HDL Ratio: 3
VLDL: 24.2 mg/dL (ref 0.0–40.0)

## 2013-08-12 LAB — RENAL FUNCTION PANEL
Albumin: 2.9 g/dL — ABNORMAL LOW (ref 3.5–5.2)
BUN: 28 mg/dL — AB (ref 6–23)
CALCIUM: 9.3 mg/dL (ref 8.4–10.5)
CO2: 24 mEq/L (ref 19–32)
CREATININE: 3.1 mg/dL — AB (ref 0.4–1.5)
Chloride: 109 mEq/L (ref 96–112)
GFR: 25.95 mL/min — ABNORMAL LOW (ref >60.00)
Glucose, Bld: 135 mg/dL — ABNORMAL HIGH (ref 70–99)
Phosphorus: 3 mg/dL (ref 2.3–4.6)
Potassium: 4.8 mEq/L (ref 3.5–5.1)
Sodium: 140 mEq/L (ref 135–145)

## 2013-08-12 LAB — HEMOGLOBIN A1C: HEMOGLOBIN A1C: 8.1 % — AB (ref 4.6–6.5)

## 2013-08-19 ENCOUNTER — Ambulatory Visit (INDEPENDENT_AMBULATORY_CARE_PROVIDER_SITE_OTHER): Payer: Medicare Other | Admitting: Endocrinology

## 2013-08-19 ENCOUNTER — Encounter: Payer: Self-pay | Admitting: Endocrinology

## 2013-08-19 ENCOUNTER — Encounter: Payer: Medicare Other | Attending: Endocrinology | Admitting: Nutrition

## 2013-08-19 VITALS — BP 112/68 | HR 78 | Temp 98.1°F | Resp 14 | Ht 68.5 in | Wt 163.0 lb

## 2013-08-19 DIAGNOSIS — IMO0001 Reserved for inherently not codable concepts without codable children: Secondary | ICD-10-CM

## 2013-08-19 DIAGNOSIS — E1165 Type 2 diabetes mellitus with hyperglycemia: Secondary | ICD-10-CM

## 2013-08-19 DIAGNOSIS — E785 Hyperlipidemia, unspecified: Secondary | ICD-10-CM

## 2013-08-19 DIAGNOSIS — E119 Type 2 diabetes mellitus without complications: Secondary | ICD-10-CM | POA: Insufficient documentation

## 2013-08-19 LAB — GLUCOSE, POCT (MANUAL RESULT ENTRY): POC Glucose: 214 mg/dl — AB (ref 70–99)

## 2013-08-19 NOTE — Progress Notes (Signed)
Patient ID: [name], male   DOB: 1940-05-02, 73 y.o.   MRN: [id number]   Reason for Appointment : Followup of  Type 2 Diabetes  History of Present Illness          Diagnosis: Type 2 diabetes mellitus, date of diagnosis:   2001     Past history: He has had long-standing diabetes treated with various oral hypoglycemic drugs.  He has not been on metformin because of renal dysfunction He thinks he was given Actos for sometime but not clear if he was benefiting. This was stopped probably in 2012 but he does not think he had any side effects. He thinks he has been taking Tradjenta for a couple of years  His A1c has been monitored about once or twice a year as per records  Because of worsening A1c in 12/14 he was referred for further evaluation.  Recent history:  Because of baseline A1c of 8.6% in 12/14he was started on glipizide ER 5 mg and pioglitazone 30 mg daily in addition to his Tradjenta He was also started on home glucose monitoring with a One Touch monitor However he finds it difficult to check his glucose because of inability to get a blood sample No changes were made on his last visit but he was advised to improve his diet He still drinking  milk shakes and sweet tea and has not made appointment with dietitian as directed He has however started walking a little; has not lost any weight as yet  Glucose monitoring:  currently using a ? One Touch monitor Range 118-235, mostly fasting Hypoglycemia: None    Self-care: The diet that the patient has been following is: None, see above     Meals: 3 meals per day. No Cokes, has sweet tea, some milkshakes        Exercise: walking 15 min        Dietician visit:  pending              Retinal exam: Most recent: 3 years ago. Weight history:  Wt Readings from Last 3 Encounters:  08/19/13 163 lb (73.936 kg)  05/20/13 154 lb 6.4 oz (70.035 kg)  04/10/13 164 lb 12.8 oz (74.753 kg)   Glycemic control:  Lab Results  Component Value  Date   HGBA1C 8.1* 08/12/2013   HGBA1C 8.6* 03/13/2013   HGBA1C 7.8* 11/02/2011   Lab Results  Component Value Date   MICROALBUR 0.8 03/13/2013   LDLCALC 98 08/12/2013   CREATININE 3.1* 08/12/2013          Medication List       This list is accurate as of: 08/19/13  3:03 PM.  Always use your most recent med list.               atenolol 100 MG tablet  Commonly known as:  TENORMIN  Take 1 tablet by mouth daily.     atorvastatin 10 MG tablet  Commonly known as:  LIPITOR  TAKE 1/2 TABLET (5 MG TOTAL) BY MOUTH DAILY.     calcium-vitamin D 500 MG tablet  Take 1 tablet by mouth 2 (two) times daily.     furosemide 40 MG tablet  Commonly known as:  LASIX  Take 1 tablet by mouth daily.     glipiZIDE 5 MG 24 hr tablet  Commonly known as:  GLUCOTROL XL  Take 1 tablet (5 mg total) by mouth daily with breakfast.     glucose blood test strip  Commonly  known as:  ONE TOUCH ULTRA TEST  Test blood sugar 3 times a day after each meal and 2 times a week in the morning as instructed. Dx code: 250.00     lisinopril-hydrochlorothiazide 20-12.5 MG per tablet  Commonly known as:  PRINZIDE,ZESTORETIC  TAKE 2 TABLETS BY MOUTH DAILY     ONE TOUCH ULTRA SYSTEM KIT W/DEVICE Kit  1 kit by Does not apply route once.     onetouch ultrasoft lancets  Test blood sugar 3 times a day after each meal and 2 times a week in the morning as instructed. Dx code: 250.00     pioglitazone 30 MG tablet  Commonly known as:  ACTOS  Take 1 tablet (30 mg total) by mouth daily. Pt wants 90 day supply on all medicine     potassium chloride SA 20 MEQ tablet  Commonly known as:  KLOR-CON M20  Take 1 tablet by mouth daily        Allergies: No Known Allergies  Past Medical History  Diagnosis Date  . Hypertension   . Diabetes mellitus   . Hyperlipidemia   . Prostate cancer   . Renal insufficiency   . Cataract     Past Surgical History  Procedure Laterality Date  . Prostate surgery      radiation  therapy only    Family History  Problem Relation Age of Onset  . Diabetes Father   . Diabetes Sister   . Hypertension Sister   . Hypertension Brother   . Heart disease Neg Hx     Social History:  reports that he has quit smoking. He does not have any smokeless tobacco history on file. His alcohol and drug histories are not on file.    Review of Systems       Lipids: Currently taking 10 mg Lipitor, on medication for sometime. 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 He has been told that he has a cataract since he was young                The blood pressure has been Controlled with medications for several years     LABS:  Office Visit on 08/19/2013  Component Date Value Ref Range Status  . POC Glucose 08/19/2013 214* 70 - 99 mg/dl Final    Physical Examination:  BP 112/68  Pulse 78  Temp(Src) 98.1 F (36.7 C)  Resp 14  Ht 5' 8.5" (1.74 m)  Wt 163 lb (73.936 kg)  BMI 24.42 kg/m2  SpO2 99%          ASSESSMENT:  1. Diabetes type 2, currently on regimen of 3 drugs and is tolerating Actos without any edema  Also appears to be benefiting from glipizide ER without any hypoglycemia Problems identified today:  Inadequate dietary compliance and minimal knowledge of meal planning  Inadequate exercise regimen  Not checking readings after meals  Has difficulty with getting blood sample from his finger for her glucose testing  #2. Renal failure GFR about 30 followed by a nephrologist  PLAN:    Continue same medication  He will be referred to the diabetes educator to help him with glucose monitoring  More  readings after meals and bring monitor for download  Improve diet and eliminate simple sugars, this was reinforced by diabetes  educator  More regular exercise  Regular regular eye  exams once glucose is better controlled  Elayne Snare 08/19/2013, 3:03 PM

## 2013-08-19 NOTE — Progress Notes (Signed)
Patient reports that he is not testing blood sugars because he is having trouble getting enough blood.  We reviewed how to adjust the needle length on the lancet device, and then ways to increase blood flow to fingers.  He was shown how to milk the finger from the wrist in stead of the finger to get more blood.   His diet is very high in sugar and calories.   Typical day: 6:30AM:  Small glass of OJ, milk and coffee 8:30AM: Breakfast:  3 eggs, 3 pieces of toast, grits, jelly and 1 bottle of Poweraid to drink. 10:00AM:  2 eggs, 2 pieces of toast with jelly and Gatoraid, or orange juice and coffee to drink 2:30PM: 2 hot dogs, medium order of french fries, large sweet tea,or KoolAid.   4PM: milkshake from [employer], or ice cream shop 7PM: dinner of 4-5 ounces of meat, 2 veg. And sweet tea to drink 9-9:30PM 3 eggs, 3 pieces of toast, and water to drink.  Plan:   1.Stop all sweet drinks.  Other options given to him to drink 2.  Get small milk shake at [facility], or ice cream shop 3.  Walk for 30 min. QOD 4.  Test blood sugars twice a day, before meals or at bedtime. 5. Call results to me in one week.

## 2013-08-19 NOTE — Patient Instructions (Signed)
Please check blood sugars at least half the time about 2 hours after any meal and 3x weekly on waking up.  Please bring blood sugar monitor to each visit  See eye Dr  Majel Homer twice daily

## 2013-08-24 NOTE — Patient Instructions (Signed)
1. Stop all sweet drinks.  Other options given to him to drink 2. Get small milk shake at [facility], or ice cream shop 3. Walk for 30 min. QOD 4. Test blood sugars twice a day, before meals or at bedtime. 5. Call results to me in one week.

## 2013-09-06 NOTE — Assessment & Plan Note (Addendum)
Controlled. BP: 132/82 mmHg   Continue meds

## 2013-09-06 NOTE — Progress Notes (Signed)
Dm- no home bps or cbgs Tolerating meds No polyuria, poydipsia  htn- no home bps  Renal insuff- needs f/u labs  Past Medical History  Diagnosis Date  . Hypertension   . Diabetes mellitus   . Hyperlipidemia   . Prostate cancer   . Renal insufficiency   . Cataract     History   Social History  . Marital Status: Married    Spouse Name: N/A    Number of Children: N/A  . Years of Education: N/A   Occupational History  . Not on file.   Social History Main Topics  . Smoking status: Former Research scientist (life sciences)  . Smokeless tobacco: Not on file  . Alcohol Use: Not on file  . Drug Use: Not on file  . Sexual Activity: Not on file   Other Topics Concern  . Not on file   Social History Narrative  . No narrative on file    Past Surgical History  Procedure Laterality Date  . Prostate surgery      radiation therapy only    Family History  Problem Relation Age of Onset  . Diabetes Father   . Diabetes Sister   . Hypertension Sister   . Hypertension Brother   . Heart disease Neg Hx     No Known Allergies  Current Outpatient Prescriptions on File Prior to Visit  Medication Sig Dispense Refill  . atenolol (TENORMIN) 100 MG tablet Take 1 tablet by mouth daily.      Marland Kitchen atorvastatin (LIPITOR) 10 MG tablet TAKE 1/2 TABLET (5 MG TOTAL) BY MOUTH DAILY.  90 tablet  0  . Blood Glucose Monitoring Suppl (ONE TOUCH ULTRA SYSTEM KIT) W/DEVICE KIT 1 kit by Does not apply route once.  1 each  0  . calcium-vitamin D 500 MG tablet Take 1 tablet by mouth 2 (two) times daily.        . furosemide (LASIX) 40 MG tablet Take 1 tablet by mouth daily.      Marland Kitchen glipiZIDE (GLUCOTROL XL) 5 MG 24 hr tablet Take 1 tablet (5 mg total) by mouth daily with breakfast.  90 tablet  1  . glucose blood (ONE TOUCH ULTRA TEST) test strip Test blood sugar 3 times a day after each meal and 2 times a week in the morning as instructed. Dx code: 250.00  100 each  11  . Lancets (ONETOUCH ULTRASOFT) lancets Test blood sugar 3  times a day after each meal and 2 times a week in the morning as instructed. Dx code: 250.00  100 each  11  . lisinopril-hydrochlorothiazide (PRINZIDE,ZESTORETIC) 20-12.5 MG per tablet TAKE 2 TABLETS BY MOUTH DAILY  180 tablet  1  . pioglitazone (ACTOS) 30 MG tablet Take 1 tablet (30 mg total) by mouth daily. Pt wants 90 day supply on all medicine  90 tablet  1  . potassium chloride SA (KLOR-CON M20) 20 MEQ tablet Take 1 tablet by mouth daily  90 tablet  3   No current facility-administered medications on file prior to visit.     patient denies chest pain, shortness of breath, orthopnea. Denies lower extremity edema, abdominal pain, change in appetite, change in bowel movements. Patient denies rashes, musculoskeletal complaints. No other specific complaints in a complete review of systems.   Reviewed vitals  well-developed well-nourished male in no acute distress. HEENT exam atraumatic, normocephalic, neck supple without jugular venous distention. Chest clear to auscultation cardiac exam S1-S2 are regular. Abdominal exam overweight with bowel sounds, soft and nontender.  Extremities no 2+edema. Neurologic exam is alert with a normal gait.  HYPERTENSION Controlled Continue meds  DIABETES MELLITUS, TYPE II Last a1c in may >8 Discussed need for diet and exercise Continue same meds  He need to follow a better diet  RENAL INSUFFICIENCY Lab Results  Component Value Date   CREATININE 3.1* 08/12/2013   Has regular f/u with nephrology  I suspect edema is multifactorial- will prescribe compression stockings

## 2013-09-06 NOTE — Assessment & Plan Note (Signed)
Last a1c in may >8 Discussed need for diet and exercise Continue same meds  He need to follow a better diet

## 2013-09-06 NOTE — Assessment & Plan Note (Signed)
Lab Results  Component Value Date   CREATININE 3.1* 08/12/2013   Has regular f/u with nephrology

## 2013-09-07 ENCOUNTER — Ambulatory Visit (INDEPENDENT_AMBULATORY_CARE_PROVIDER_SITE_OTHER): Payer: Medicare Other | Admitting: Internal Medicine

## 2013-09-07 ENCOUNTER — Encounter: Payer: Self-pay | Admitting: Internal Medicine

## 2013-09-07 VITALS — BP 132/82 | HR 72 | Temp 99.0°F | Ht 68.5 in | Wt 162.0 lb

## 2013-09-07 DIAGNOSIS — E119 Type 2 diabetes mellitus without complications: Secondary | ICD-10-CM

## 2013-09-07 DIAGNOSIS — R609 Edema, unspecified: Secondary | ICD-10-CM

## 2013-09-07 DIAGNOSIS — I10 Essential (primary) hypertension: Secondary | ICD-10-CM | POA: Diagnosis not present

## 2013-09-07 DIAGNOSIS — N259 Disorder resulting from impaired renal tubular function, unspecified: Secondary | ICD-10-CM | POA: Diagnosis not present

## 2013-09-07 DIAGNOSIS — R600 Localized edema: Secondary | ICD-10-CM

## 2013-09-07 NOTE — Progress Notes (Signed)
Pre visit review using our clinic review tool, if applicable. No additional management support is needed unless otherwise documented below in the visit note. 

## 2013-09-08 ENCOUNTER — Telehealth: Payer: Self-pay | Admitting: Internal Medicine

## 2013-09-08 NOTE — Telephone Encounter (Signed)
Relevant patient education assigned to patient using Emmi. ° °

## 2013-09-11 ENCOUNTER — Ambulatory Visit: Payer: No Typology Code available for payment source | Admitting: Internal Medicine

## 2013-09-16 ENCOUNTER — Other Ambulatory Visit: Payer: Self-pay | Admitting: Internal Medicine

## 2013-11-03 ENCOUNTER — Encounter: Payer: Self-pay | Admitting: Internal Medicine

## 2013-11-10 ENCOUNTER — Other Ambulatory Visit (INDEPENDENT_AMBULATORY_CARE_PROVIDER_SITE_OTHER): Payer: Medicare Other

## 2013-11-10 DIAGNOSIS — E119 Type 2 diabetes mellitus without complications: Secondary | ICD-10-CM

## 2013-11-10 LAB — HEMOGLOBIN A1C: Hgb A1c MFr Bld: 7.4 % — ABNORMAL HIGH (ref 4.6–6.5)

## 2013-11-10 LAB — GLUCOSE, RANDOM: Glucose, Bld: 112 mg/dL — ABNORMAL HIGH (ref 70–99)

## 2013-11-16 ENCOUNTER — Ambulatory Visit (INDEPENDENT_AMBULATORY_CARE_PROVIDER_SITE_OTHER): Payer: Medicare Other | Admitting: Endocrinology

## 2013-11-16 ENCOUNTER — Encounter: Payer: Self-pay | Admitting: Endocrinology

## 2013-11-16 VITALS — BP 148/74 | HR 65 | Temp 98.5°F | Resp 16 | Ht 68.5 in | Wt 165.0 lb

## 2013-11-16 DIAGNOSIS — H5440 Blindness, one eye, unspecified eye: Secondary | ICD-10-CM | POA: Insufficient documentation

## 2013-11-16 DIAGNOSIS — I10 Essential (primary) hypertension: Secondary | ICD-10-CM

## 2013-11-16 DIAGNOSIS — E1165 Type 2 diabetes mellitus with hyperglycemia: Principal | ICD-10-CM

## 2013-11-16 DIAGNOSIS — IMO0001 Reserved for inherently not codable concepts without codable children: Secondary | ICD-10-CM

## 2013-11-16 DIAGNOSIS — E119 Type 2 diabetes mellitus without complications: Secondary | ICD-10-CM | POA: Diagnosis not present

## 2013-11-16 DIAGNOSIS — E785 Hyperlipidemia, unspecified: Secondary | ICD-10-CM | POA: Diagnosis not present

## 2013-11-16 LAB — GLUCOSE, POCT (MANUAL RESULT ENTRY): POC Glucose: 85 mg/dl (ref 70–99)

## 2013-11-16 NOTE — Progress Notes (Signed)
Patient ID: [name], male   DOB: 09-Feb-1941, 73 y.o.   MRN: [id number]   Reason for Appointment : Followup of  Type 2 Diabetes  History of Present Illness          Diagnosis: Type 2 diabetes mellitus, date of diagnosis:   2001     Past history: He has had long-standing diabetes treated with various oral hypoglycemic drugs.  He has not been on metformin because of renal dysfunction He thinks he was given Actos for sometime but not clear if he was benefiting. This was stopped probably in 2012 but he does not think he had any side effects. He thinks he has been taking Tradjenta for a couple of years  His A1c has been monitored about once or twice a year as per records  Because of worsening A1c in 12/14 he was referred for further evaluation.  Recent history:  Because of baseline A1c of 8.6% in 12/14 he was started on glipizide ER 5 mg and pioglitazone 30 mg daily in addition to his Tradjenta He was also started on home glucose monitoring with a One Touch monitor However he finds it difficult to check his glucose as he is somewhat apprehensive about doing this Appear that he has stopped his Tradgenta on his own at some point but his blood sugars are not any higher He has seen the diabetes educator for help with diet and home glucose monitoring Again on his last visit he was advised to improve his diet He still drinking  milk shakes and sweet tea and has not seen the nutritionist He has been walking a little more His weight is slightly higher  Glucose monitoring:  currently using a ? One Touch monitor. Does not remember his glucose readings Hypoglycemia: None    Self-care: The diet that the patient has been following is: None, see above     Meals: 3 meals per day. No Cokes, has some sweet tea, some milkshakes        Exercise: walking 15 min daily       Dietician visit:  pending. CDE 5/15             Retinal exam: Most recent: 3 years ago.  Weight history:  Wt Readings from  Last 3 Encounters:  11/16/13 165 lb (74.844 kg)  09/07/13 162 lb (73.483 kg)  08/19/13 163 lb (73.936 kg)   Glycemic control:  Lab Results  Component Value Date   HGBA1C 7.4* 11/10/2013   HGBA1C 8.1* 08/12/2013   HGBA1C 8.6* 03/13/2013   Lab Results  Component Value Date   MICROALBUR 0.8 03/13/2013   LDLCALC 98 08/12/2013   CREATININE 3.1* 08/12/2013          Medication List       This list is accurate as of: 11/16/13  8:13 AM.  Always use your most recent med list.               atenolol 100 MG tablet  Commonly known as:  TENORMIN  Take 1 tablet by mouth daily.     atorvastatin 10 MG tablet  Commonly known as:  LIPITOR  TAKE 1/2 TABLET (5 MG TOTAL) BY MOUTH DAILY.     calcium-vitamin D 500 MG tablet  Take 1 tablet by mouth 2 (two) times daily.     furosemide 40 MG tablet  Commonly known as:  LASIX  Take 1 tablet by mouth daily.     glipiZIDE 5 MG 24 hr tablet  Commonly known as:  GLUCOTROL XL  Take 1 tablet (5 mg total) by mouth daily with breakfast.     glucose blood test strip  Commonly known as:  ONE TOUCH ULTRA TEST  Test blood sugar 3 times a day after each meal and 2 times a week in the morning as instructed. Dx code: 250.00     lisinopril-hydrochlorothiazide 20-12.5 MG per tablet  Commonly known as:  PRINZIDE,ZESTORETIC  TAKE 2 TABLETS BY MOUTH DAILY     onetouch ultrasoft lancets  Test blood sugar 3 times a day after each meal and 2 times a week in the morning as instructed. Dx code: 250.00     pioglitazone 30 MG tablet  Commonly known as:  ACTOS  Take 1 tablet (30 mg total) by mouth daily. Pt wants 90 day supply on all medicine     potassium chloride SA 20 MEQ tablet  Commonly known as:  KLOR-CON M20  Take 1 tablet by mouth daily        Allergies: No Known Allergies  Past Medical History  Diagnosis Date  . Hypertension   . Diabetes mellitus   . Hyperlipidemia   . Prostate cancer   . Renal insufficiency   . Cataract     Past Surgical  History  Procedure Laterality Date  . Prostate surgery      radiation therapy only    Family History  Problem Relation Age of Onset  . Diabetes Father   . Diabetes Sister   . Hypertension Sister   . Hypertension Brother   . Heart disease Neg Hx     Social History:  reports that he has quit smoking. He does not have any smokeless tobacco history on file. His alcohol and drug histories are not on file.    Review of Systems       Lipids: Currently taking 10 mg Lipitor, on medication for sometime. 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 He has been told that he has a cataract since he was young                The blood pressure has been Controlled with medications for several years and he is now taking atenolol only. Not clear why  lisinopril was stopped and he is going to followup with nephrologist this month    LABS:  Office Visit on 11/16/2013  Component Date Value Ref Range Status  . POC Glucose 11/16/2013 85  70 - 99 mg/dl Final  Appointment on 11/10/2013  Component Date Value Ref Range Status  . Hemoglobin A1C 11/10/2013 7.4* 4.6 - 6.5 % Final   Glycemic Control Guidelines for People with Diabetes:Non Diabetic:  <6%Goal of Therapy: <7%Additional Action Suggested:  >8%   . Glucose, Bld 11/10/2013 112* 70 - 99 mg/dL Final    Physical Examination:  BP 150/79  Pulse 65  Temp(Src) 98.5 F (36.9 C)  Resp 16  Ht 5' 8.5" (1.74 m)  Wt 165 lb (74.844 kg)  BMI 24.72 kg/m2  SpO2 97%       repeat blood pressure 148/74 No ankle edema  ASSESSMENT:  1. Diabetes type 2, currently on regimen of 2 drugs and is  tolerating Actos without any edema  Has gained a little weight. Also appears to be benefiting from glipizide ER without any hypoglycemia Problems  identified today:  Inadequate dietary compliance   Inadequate exercise regimen and some weight gain  Not checking readings at home  2. Renal failure with GFR about 30 followed by a nephrologist  3. Hypertension with relatively high reading today, he will followup with nephrologist later this month  PLAN:    Continue glipizide ER along with Actos  He will be referred to the diabetes educator to help him with glucose monitoring  Start checking blood sugar readings after meals and bring monitor for download  Improve diet and eliminate simple sugars   More regular walking  Regular regular eye exams, he needs to schedule one now  [facility] 11/16/2013, 8:13 AM

## 2013-11-16 NOTE — Patient Instructions (Addendum)
Please check blood sugars at least half the time about 2 hours after any meal and 2 times per week on waking up. Please bring blood sugar monitor to each visit  Use 1/2 as much ice cream and sweet tea  Go to eye Doctor

## 2013-12-03 DIAGNOSIS — N183 Chronic kidney disease, stage 3 unspecified: Secondary | ICD-10-CM | POA: Diagnosis not present

## 2013-12-03 DIAGNOSIS — N2581 Secondary hyperparathyroidism of renal origin: Secondary | ICD-10-CM | POA: Diagnosis not present

## 2013-12-03 DIAGNOSIS — I10 Essential (primary) hypertension: Secondary | ICD-10-CM | POA: Diagnosis not present

## 2013-12-08 ENCOUNTER — Other Ambulatory Visit: Payer: Self-pay | Admitting: Endocrinology

## 2013-12-21 ENCOUNTER — Ambulatory Visit (INDEPENDENT_AMBULATORY_CARE_PROVIDER_SITE_OTHER): Payer: Medicare Other | Admitting: *Deleted

## 2013-12-21 VITALS — BP 136/82

## 2013-12-21 DIAGNOSIS — I10 Essential (primary) hypertension: Secondary | ICD-10-CM

## 2013-12-21 NOTE — Progress Notes (Signed)
Pt comes in today for a nurse visit blood pressure check for the diabetic bundle.  BP was 136/82, told pt to continue current medications

## 2014-01-14 ENCOUNTER — Telehealth: Payer: Self-pay | Admitting: Internal Medicine

## 2014-01-14 MED ORDER — ATORVASTATIN CALCIUM 10 MG PO TABS: ORAL_TABLET | ORAL | Status: DC

## 2014-01-14 NOTE — Telephone Encounter (Signed)
[id number] - [place], [place] - [address] is requesting re-fill on atorvastatin (LIPITOR) 10 MG tablet

## 2014-01-14 NOTE — Telephone Encounter (Signed)
Done

## 2014-03-08 ENCOUNTER — Encounter: Payer: Self-pay | Admitting: Family

## 2014-03-08 ENCOUNTER — Ambulatory Visit (INDEPENDENT_AMBULATORY_CARE_PROVIDER_SITE_OTHER): Payer: Medicare Other | Admitting: Family

## 2014-03-08 ENCOUNTER — Ambulatory Visit (INDEPENDENT_AMBULATORY_CARE_PROVIDER_SITE_OTHER): Payer: Medicare Other

## 2014-03-08 VITALS — BP 140/80 | HR 76 | Ht 68.5 in | Wt 156.0 lb

## 2014-03-08 DIAGNOSIS — Z23 Encounter for immunization: Secondary | ICD-10-CM | POA: Diagnosis not present

## 2014-03-08 DIAGNOSIS — IMO0002 Reserved for concepts with insufficient information to code with codable children: Secondary | ICD-10-CM

## 2014-03-08 DIAGNOSIS — E876 Hypokalemia: Secondary | ICD-10-CM | POA: Diagnosis not present

## 2014-03-08 DIAGNOSIS — E785 Hyperlipidemia, unspecified: Secondary | ICD-10-CM

## 2014-03-08 DIAGNOSIS — E1165 Type 2 diabetes mellitus with hyperglycemia: Secondary | ICD-10-CM | POA: Diagnosis not present

## 2014-03-08 LAB — COMPREHENSIVE METABOLIC PANEL
ALT: 10 U/L (ref 0–53)
AST: 19 U/L (ref 0–37)
Albumin: 3.8 g/dL (ref 3.5–5.2)
Alkaline Phosphatase: 79 U/L (ref 39–117)
BUN: 31 mg/dL — AB (ref 6–23)
CALCIUM: 9.6 mg/dL (ref 8.4–10.5)
CHLORIDE: 100 meq/L (ref 96–112)
CO2: 25 meq/L (ref 19–32)
CREATININE: 2.7 mg/dL — AB (ref 0.4–1.5)
GFR: 30.07 mL/min — AB (ref >60.00)
GLUCOSE: 147 mg/dL — AB (ref 70–99)
Potassium: 4.1 mEq/L (ref 3.5–5.1)
Sodium: 137 mEq/L (ref 135–145)
Total Bilirubin: 0.4 mg/dL (ref 0.2–1.2)
Total Protein: 7.2 g/dL (ref 6.0–8.3)

## 2014-03-08 LAB — LIPID PANEL
CHOLESTEROL: 167 mg/dL (ref 0–200)
HDL: 50.4 mg/dL (ref >39.00)
LDL Cholesterol: 91 mg/dL (ref 0–99)
NONHDL: 116.6
Total CHOL/HDL Ratio: 3
Triglycerides: 129 mg/dL (ref 0.0–149.0)
VLDL: 25.8 mg/dL (ref 0.0–40.0)

## 2014-03-08 LAB — HEMOGLOBIN A1C: HEMOGLOBIN A1C: 8.6 % — AB (ref 4.6–6.5)

## 2014-03-08 NOTE — Progress Notes (Signed)
Pre visit review using our clinic review tool, if applicable. No additional management support is needed unless otherwise documented below in the visit note. 

## 2014-03-08 NOTE — Patient Instructions (Signed)
Diabetes and Standards of Medical Care Diabetes is complicated. You may find that your diabetes team includes a dietitian, nurse, diabetes educator, eye doctor, and more. To help everyone know what is going on and to help you get the care you deserve, the following schedule of care was developed to help keep you on track. Below are the tests, exams, vaccines, medicines, education, and plans you will need. HbA1c test This test shows how well you have controlled your glucose over the past 2-3 months. It is used to see if your diabetes management plan needs to be adjusted.   It is performed at least 2 times a year if you are meeting treatment goals.  It is performed 4 times a year if therapy has changed or if you are not meeting treatment goals. Blood pressure test  This test is performed at every routine medical visit. The goal is less than 140/90 mm Hg for most people, but 130/80 mm Hg in some cases. Ask your health care provider about your goal. Dental exam  Follow up with the dentist regularly. Eye exam  If you are diagnosed with type 1 diabetes as a child, get an exam upon reaching the age of 37 years or older and have had diabetes for 3-5 years. Yearly eye exams are recommended after that initial eye exam.  If you are diagnosed with type 1 diabetes as an adult, get an exam within 5 years of diagnosis and then yearly.  If you are diagnosed with type 2 diabetes, get an exam as soon as possible after the diagnosis and then yearly. Foot care exam  Visual foot exams are performed at every routine medical visit. The exams check for cuts, injuries, or other problems with the feet.  A comprehensive foot exam should be done yearly. This includes visual inspection as well as assessing foot pulses and testing for loss of sensation.  Check your feet nightly for cuts, injuries, or other problems with your feet. Tell your health care provider if anything is not healing. Kidney function test (urine  microalbumin)  This test is performed once a year.  Type 1 diabetes: The first test is performed 5 years after diagnosis.  Type 2 diabetes: The first test is performed at the time of diagnosis.  A serum creatinine and estimated glomerular filtration rate (eGFR) test is done once a year to assess the level of chronic kidney disease (CKD), if present. Lipid profile (cholesterol, HDL, LDL, triglycerides)  Performed every 5 years for most people.  The goal for LDL is less than 100 mg/dL. If you are at high risk, the goal is less than 70 mg/dL.  The goal for HDL is 40 mg/dL-50 mg/dL for men and 50 mg/dL-60 mg/dL for women. An HDL cholesterol of 60 mg/dL or higher gives some protection against heart disease.  The goal for triglycerides is less than 150 mg/dL. Influenza vaccine, pneumococcal vaccine, and hepatitis B vaccine  The influenza vaccine is recommended yearly.  It is recommended that people with diabetes who are over 24 years old get the pneumonia vaccine. In some cases, two separate shots may be given. Ask your health care provider if your pneumonia vaccination is up to date.  The hepatitis B vaccine is also recommended for adults with diabetes. Diabetes self-management education  Education is recommended at diagnosis and ongoing as needed. Treatment plan  Your treatment plan is reviewed at every medical visit. Document Released: 01/21/2009 Document Revised: 08/10/2013 Document Reviewed: 08/26/2012 [facility] Patient Information 2015 [place]. This information is not intended to replace advice given to you by your health care provider. Make sure you discuss any questions you have with your health care provider.  

## 2014-03-08 NOTE — Progress Notes (Signed)
Subjective:    Patient ID: [name], male    DOB: 26-Jan-1941, 73 y.o.   MRN: [id number]  HPI 73 year old African-American male, nonsmoker with a history of type 2 diabetes, hypertension, hyperlipidemia, and a history of prostate cancer is in today for recheck. He is under the care of endocrinology for management of type 2 diabetes. Tolerates medication well. Does not routinely exercise medicines to follow healthy diet. Has an office visit with endocrinology in 10 days. Has not had influenza or pneumonia vaccine up-to-date. Has a history of renal insufficiency and sees nephrology. Continues to see urology for management of prostate cancer history.   Review of Systems  Constitutional: Negative.   HENT: Negative.   Eyes: Negative.   Respiratory: Negative.   Cardiovascular: Negative.   Gastrointestinal: Negative.   Endocrine: Negative.   Genitourinary: Negative.   Musculoskeletal: Negative.   Skin: Negative.   Allergic/Immunologic: Negative.   Neurological: Negative.   Hematological: Negative.   Psychiatric/Behavioral: Negative.    Past Medical History  Diagnosis Date  . Hypertension   . Diabetes mellitus   . Hyperlipidemia   . Prostate cancer   . Renal insufficiency   . Cataract     History   Social History  . Marital Status: Married    Spouse Name: N/A    Number of Children: N/A  . Years of Education: N/A   Occupational History  . Not on file.   Social History Main Topics  . Smoking status: Former Research scientist (life sciences)  . Smokeless tobacco: Not on file  . Alcohol Use: Not on file  . Drug Use: Not on file  . Sexual Activity: Not on file   Other Topics Concern  . Not on file   Social History Narrative    Past Surgical History  Procedure Laterality Date  . Prostate surgery      radiation therapy only    Family History  Problem Relation Age of Onset  . Diabetes Father   . Diabetes Sister   . Hypertension Sister   . Hypertension Brother   . Heart disease Neg  Hx     No Known Allergies  Current Outpatient Prescriptions on File Prior to Visit  Medication Sig Dispense Refill  . atenolol (TENORMIN) 100 MG tablet Take 1 tablet by mouth daily.    Marland Kitchen atorvastatin (LIPITOR) 10 MG tablet TAKE 1/2 TABLET (5 MG TOTAL) BY MOUTH DAILY. 90 tablet 0  . calcium-vitamin D 500 MG tablet Take 1 tablet by mouth 2 (two) times daily.      . furosemide (LASIX) 80 MG tablet Take 80 mg by mouth.    Marland Kitchen glipiZIDE (GLUCOTROL XL) 5 MG 24 hr tablet TAKE 1 TABLET (5 MG TOTAL) BY MOUTH DAILY WITH BREAKFAST. 90 tablet 1  . glucose blood (ONE TOUCH ULTRA TEST) test strip Test blood sugar 3 times a day after each meal and 2 times a week in the morning as instructed. Dx code: 250.00 100 each 11  . Lancets (ONETOUCH ULTRASOFT) lancets Test blood sugar 3 times a day after each meal and 2 times a week in the morning as instructed. Dx code: 250.00 100 each 11  . pioglitazone (ACTOS) 30 MG tablet TAKE 1 TABLET (30 MG TOTAL) BY MOUTH DAILY. PT WANTS 90 DAY SUPPLY ON ALL MEDICINE 90 tablet 1  . potassium chloride SA (KLOR-CON M20) 20 MEQ tablet Take 1 tablet by mouth daily 90 tablet 3   No current facility-administered medications on file prior to visit.  BP 140/80 mmHg  Pulse 76  Ht 5' 8.5" (1.74 m)  Wt 156 lb (70.761 kg)  BMI 23.37 kg/m2chart     Objective:   Physical Exam  Constitutional: He is oriented to person, place, and time. He appears well-developed and well-nourished.  Eyes: Pupils are equal, round, and reactive to light.  Neck: Normal range of motion. Neck supple.  Cardiovascular: Normal rate, regular rhythm and normal heart sounds.   Pulmonary/Chest: Effort normal and breath sounds normal.  Abdominal: Soft. Bowel sounds are normal.  Musculoskeletal: Normal range of motion.  Neurological: He is alert and oriented to person, place, and time.  Skin: Skin is warm and dry.  Psychiatric: He has a normal mood and affect.          Assessment & Plan:  [name] was  seen today for no specified reason.  Diagnoses and associated orders for this visit:  Type II diabetes mellitus, uncontrolled - Hemoglobin A1c - Comprehensive metabolic panel - Lipid panel  Hypokalemia  Hyperlipidemia - Lipid panel    encouraged healthy diet, exercise. Influenza and Prevnar 13 was given today. Follow-up here in 4 months and sooner as needed. See specialist as scheduled.

## 2014-03-13 ENCOUNTER — Other Ambulatory Visit: Payer: Self-pay | Admitting: Internal Medicine

## 2014-03-15 ENCOUNTER — Ambulatory Visit (INDEPENDENT_AMBULATORY_CARE_PROVIDER_SITE_OTHER): Payer: Medicare Other | Admitting: Endocrinology

## 2014-03-15 ENCOUNTER — Other Ambulatory Visit: Payer: Self-pay | Admitting: *Deleted

## 2014-03-15 ENCOUNTER — Encounter: Payer: Self-pay | Admitting: Endocrinology

## 2014-03-15 ENCOUNTER — Other Ambulatory Visit: Payer: Medicare Other

## 2014-03-15 VITALS — BP 148/80 | HR 78 | Temp 98.2°F | Resp 16 | Ht 68.5 in | Wt 154.8 lb

## 2014-03-15 DIAGNOSIS — N184 Chronic kidney disease, stage 4 (severe): Secondary | ICD-10-CM | POA: Diagnosis not present

## 2014-03-15 DIAGNOSIS — E785 Hyperlipidemia, unspecified: Secondary | ICD-10-CM

## 2014-03-15 DIAGNOSIS — IMO0002 Reserved for concepts with insufficient information to code with codable children: Secondary | ICD-10-CM

## 2014-03-15 DIAGNOSIS — E1165 Type 2 diabetes mellitus with hyperglycemia: Secondary | ICD-10-CM | POA: Diagnosis not present

## 2014-03-15 MED ORDER — LINAGLIPTIN 5 MG PO TABS: 5.0000 mg | ORAL_TABLET | Freq: Every day | ORAL | Status: DC

## 2014-03-15 NOTE — Patient Instructions (Addendum)
Please check blood sugars at least half the time about 2 hours after any meal and 2 times per week on waking up.  Please bring blood sugar monitor to each visit  Restart Tradgenta 1 daily  Watch diet  better

## 2014-03-15 NOTE — Progress Notes (Signed)
Patient ID: [name], male   DOB: Dec 13, 1940, 73 y.o.   MRN: [id number]   Reason for Appointment : Followup of  Type 2 Diabetes  History of Present Illness          Diagnosis: Type 2 diabetes mellitus, date of diagnosis:   2001     Past history: He has had long-standing diabetes treated with various oral hypoglycemic drugs.  He has not been on metformin because of renal dysfunction He thinks he was given Actos for sometime but not clear if he was benefiting. This was stopped probably in 2012 but he does not think he had any side effects. He thinks he has been taking Tradjenta for a couple of years  His A1c has been monitored about once or twice a year as per records  Because of worsening A1c in 12/14 he was referred for further evaluation.  Recent history:  Because of baseline A1c of 8.6% in 12/14 he was started on glipizide ER 5 mg and pioglitazone 30 mg daily in addition to his Tradjenta He was also started on home glucose monitoring with a One Touch monitor He had not refilled his Tradjenta prior to his visit in 8/15 and since blood sugars were still stable he was not restarted on this However his A1c is back to 8.6 now Also having compliance issues with glucose monitoring which he is not doing; does not follow diet as directed and not walking recently However he finds it difficult to check his glucose as he is somewhat apprehensive about doing this Fasting glucose in the lab was 147 He has seen the diabetes educator for help with diet and home glucose monitoring He still drinking  milk shakes and sweet tea and has not seen the nutritionist His weight is slightly better  Glucose monitoring:  currently using a ? One Touch monitor. Does not check his glucose readings Hypoglycemia: None    Self-care: The diet that the patient has been following is: None, see above     Meals: 3 meals per day. Still has some sweet tea, some milkshakes        Exercise: not walking         Dietician visit:  pending. CDE 5/15             Retinal exam: Most recent: 3 years ago.  Weight history:  Wt Readings from Last 3 Encounters:  03/15/14 154 lb 12.8 oz (70.217 kg)  03/08/14 156 lb (70.761 kg)  11/16/13 165 lb (74.844 kg)   Glycemic control:  Lab Results  Component Value Date   HGBA1C 8.6* 03/08/2014   HGBA1C 7.4* 11/10/2013   HGBA1C 8.1* 08/12/2013   Lab Results  Component Value Date   MICROALBUR 0.8 03/13/2013   LDLCALC 91 03/08/2014   CREATININE 2.7* 03/08/2014          Medication List       This list is accurate as of: 03/15/14  8:13 AM.  Always use your most recent med list.               atenolol 100 MG tablet  Commonly known as:  TENORMIN  Take 1 tablet by mouth daily.     atorvastatin 10 MG tablet  Commonly known as:  LIPITOR  TAKE 1/2 TABLET (5 MG TOTAL) BY MOUTH DAILY.     calcium-vitamin D 500 MG tablet  Take 1 tablet by mouth 2 (two) times daily.     furosemide 80 MG tablet  Commonly  known as:  LASIX  Take 80 mg by mouth.     glipiZIDE 5 MG 24 hr tablet  Commonly known as:  GLUCOTROL XL  TAKE 1 TABLET (5 MG TOTAL) BY MOUTH DAILY WITH BREAKFAST.     glucose blood test strip  Commonly known as:  ONE TOUCH ULTRA TEST  Test blood sugar 3 times a day after each meal and 2 times a week in the morning as instructed. Dx code: 250.00     onetouch ultrasoft lancets  Test blood sugar 3 times a day after each meal and 2 times a week in the morning as instructed. Dx code: 250.00     pioglitazone 30 MG tablet  Commonly known as:  ACTOS  TAKE 1 TABLET (30 MG TOTAL) BY MOUTH DAILY. PT WANTS 90 DAY SUPPLY ON ALL MEDICINE     potassium chloride SA 20 MEQ tablet  Commonly known as:  KLOR-CON M20  Take 1 tablet by mouth daily        Allergies: No Known Allergies  Past Medical History  Diagnosis Date  . Hypertension   . Diabetes mellitus   . Hyperlipidemia   . Prostate cancer   . Renal insufficiency   . Cataract     Past  Surgical History  Procedure Laterality Date  . Prostate surgery      radiation therapy only    Family History  Problem Relation Age of Onset  . Diabetes Father   . Diabetes Sister   . Hypertension Sister   . Hypertension Brother   . Heart disease Neg Hx     Social History:  reports that he has quit smoking. He does not have any smokeless tobacco history on file. His alcohol and drug histories are not on file.    Review of Systems       Lipids: Currently taking 10 mg Lipitor, on medication for sometime. 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 He has been told that he has a cataract since he was young                The blood pressure has been Controlled with medications for several years and he is  taking atenolol only.  Previously lisinopril was stopped and he is going to followup with nephrologist periodically   LABS:  No visits with results within 1 Week(s) from this visit. Latest known visit with results is:  Office Visit on 03/08/2014  Component Date Value Ref Range Status  . Hgb A1c MFr Bld 03/08/2014 8.6* 4.6 - 6.5 % Final   Glycemic Control Guidelines for People with Diabetes:Non Diabetic:  <6%Goal of Therapy: <7%Additional Action Suggested:  >8%   . Sodium 03/08/2014 137  135 - 145 mEq/L Final  . Potassium 03/08/2014 4.1  3.5 - 5.1 mEq/L Final  . Chloride 03/08/2014 100  96 - 112 mEq/L Final  . CO2 03/08/2014 25  19 - 32 mEq/L Final  . Glucose, Bld 03/08/2014 147* 70 - 99 mg/dL Final  . BUN 03/08/2014 31* 6 - 23 mg/dL Final  . Creatinine, Ser 03/08/2014 2.7* 0.4 - 1.5 mg/dL Final  . Total Bilirubin 03/08/2014 0.4  0.2 - 1.2 mg/dL  Final  . Alkaline Phosphatase 03/08/2014 79  39 - 117 U/L Final  . AST 03/08/2014 19  0 - 37 U/L Final   . ALT 03/08/2014 10  0 - 53 U/L Final  . Total Protein 03/08/2014 7.2  6.0 - 8.3 g/dL Final  . Albumin 03/08/2014 3.8  3.5 - 5.2 g/dL Final  . Calcium 03/08/2014 9.6  8.4 - 10.5 mg/dL Final  . GFR 03/08/2014 30.07* >60.00 mL/min Final  . Cholesterol 03/08/2014 167  0 - 200 mg/dL Final   ATP III Classification       Desirable:  < 200 mg/dL               Borderline High:  200 - 239 mg/dL          High:  > = 240 mg/dL  . Triglycerides 03/08/2014 129.0  0.0 - 149.0 mg/dL Final   Normal:  <150 mg/dLBorderline High:  150 - 199 mg/dL  . HDL 03/08/2014 50.40  >39.00 mg/dL Final  . VLDL 03/08/2014 25.8  0.0 - 40.0 mg/dL Final  . LDL Cholesterol 03/08/2014 91  0 - 99 mg/dL Final  . Total CHOL/HDL Ratio 03/08/2014 3   Final                  Men          Women1/2 Average Risk     3.4          3.3Average Risk          5.0          4.42X Average Risk          9.6          7.13X Average Risk          15.0          11.0                      . NonHDL 03/08/2014 116.60   Final   NOTE:  Non-HDL goal should be 30 mg/dL higher than patient's LDL goal (i.e. LDL goal of < 70 mg/dL, would have non-HDL goal of < 100 mg/dL)    Physical Examination:  BP 148/80 mmHg  Temp(Src) 98.2 F (36.8 C)  Resp 16  Ht 5' 8.5" (1.74 m)  Wt 154 lb 12.8 oz (70.217 kg)  BMI 23.19 kg/m2  No ankle edema  ASSESSMENT:  1. Diabetes type 2, poorly controlled with A1c going up to 8.6 He is currently on regimen of 2 drugs  Although his glucose was relatively well controlled even without his taking Tradgenta on his last visit he is getting worsening of his control now Since he is not checking his sugar and his fasting readings are okay in the lab most likely has post prandial hyperglycemia related to poor diet and inadequate endogenous insulin Also has poor compliance with exercise regimen  2. Renal failure with GFR about 30, stable and followed by a nephrologist  3. Hypertension with relatively high reading today from anxiety    PLAN:    Restart Tradgenta and continue glipizide ER along with Actos  He will be referred to the diabetes educator again to help him with compliance with all his issues   Start checking blood sugar readings especially after meals and bring monitor for download  Improve diet and eliminate simple sugars   Restart regular walking, he can go to the [employer] with his wife  Follow-up in 6 weeks  Faigy Stretch 03/15/2014, 8:13 AM

## 2014-03-16 ENCOUNTER — Telehealth: Payer: Self-pay | Admitting: Endocrinology

## 2014-03-16 ENCOUNTER — Other Ambulatory Visit: Payer: Self-pay | Admitting: *Deleted

## 2014-03-16 NOTE — Telephone Encounter (Signed)
Patient stated he need a 90 day supply of Tradjenta, not 30 day supply.

## 2014-03-16 NOTE — Telephone Encounter (Signed)
rx was re-sent for a 90 day supply

## 2014-03-18 ENCOUNTER — Ambulatory Visit: Payer: Medicare Other | Admitting: Endocrinology

## 2014-04-21 ENCOUNTER — Other Ambulatory Visit (INDEPENDENT_AMBULATORY_CARE_PROVIDER_SITE_OTHER): Payer: Medicare Other

## 2014-04-21 DIAGNOSIS — E1165 Type 2 diabetes mellitus with hyperglycemia: Secondary | ICD-10-CM

## 2014-04-21 DIAGNOSIS — IMO0002 Reserved for concepts with insufficient information to code with codable children: Secondary | ICD-10-CM

## 2014-04-21 LAB — GLUCOSE, RANDOM: GLUCOSE: 153 mg/dL — AB (ref 70–99)

## 2014-04-23 LAB — FRUCTOSAMINE: Fructosamine: 364 umol/L — ABNORMAL HIGH (ref 190–270)

## 2014-04-26 ENCOUNTER — Ambulatory Visit (INDEPENDENT_AMBULATORY_CARE_PROVIDER_SITE_OTHER): Payer: Medicare Other | Admitting: Endocrinology

## 2014-04-26 ENCOUNTER — Encounter: Payer: Self-pay | Admitting: Endocrinology

## 2014-04-26 ENCOUNTER — Other Ambulatory Visit: Payer: Self-pay | Admitting: *Deleted

## 2014-04-26 VITALS — BP 122/71 | HR 84 | Temp 98.2°F | Resp 14 | Ht 68.5 in | Wt 155.6 lb

## 2014-04-26 DIAGNOSIS — E785 Hyperlipidemia, unspecified: Secondary | ICD-10-CM | POA: Diagnosis not present

## 2014-04-26 DIAGNOSIS — IMO0002 Reserved for concepts with insufficient information to code with codable children: Secondary | ICD-10-CM

## 2014-04-26 DIAGNOSIS — E1165 Type 2 diabetes mellitus with hyperglycemia: Secondary | ICD-10-CM

## 2014-04-26 MED ORDER — GLUCOSE BLOOD VI STRP: ORAL_STRIP | Status: DC

## 2014-04-26 MED ORDER — ONETOUCH DELICA LANCETS FINE MISC
Status: DC
Start: 2014-04-26 — End: 2015-08-28

## 2014-04-26 NOTE — Progress Notes (Addendum)
Patient ID: [name], male   DOB: March 07, 1941, 74 y.o.   MRN: [id number]   Reason for Appointment : Followup of  Type 2 Diabetes  History of Present Illness          Diagnosis: Type 2 diabetes mellitus, date of diagnosis:   2001     Past history: He has had long-standing diabetes treated with various oral hypoglycemic drugs.  He has not been on metformin because of renal dysfunction He thinks he was given Actos for sometime but not clear if he was benefiting. This was stopped probably in 2012 but he does not think he had any side effects. He thinks he has been taking Tradjenta for a couple of years  His A1c has been monitored about once or twice a year as per records  Because of worsening A1c in 12/14 he was referred for further evaluation. Because of baseline A1c of 8.6% in 12/14 he was started on glipizide ER 5 mg and pioglitazone 30 mg daily in addition to his Tradjenta He was also started on home glucose monitoring with a One Touch monitor  Recent history:  However his A1c was back up to 8.6 when he left off his Tradjenta This was restarted in 11/15. Also he was referred to nutrition for diet consultation but he did not make an appointment He has had instruction from the CDE about meal planning and he was advised to watch his diet better but he has not made much changes He still has sweet tea every other day at least from a fast food restaurant Also did not start going to the [facility] for exercise as discussed previously He says he lost his meter and has not checked his sugar.  Also has anxiety about checking his sugar too Recently has not been consistent with diet and his fructosamine indicates overall relatively high readings Fasting glucose in the lab was 153  Glucose monitoring:  was using a  One Touch ultra mini monitor. Does not check his glucose readings usually Hypoglycemia: None    Self-care: The diet that the patient has been following is: None, see above     Meals: 3  meals per day. Still has some sweet tea qod; some milkshakes        Exercise: not walking        Dietician visit:  pending. CDE 5/15             Retinal exam: Most recent: 3 years ago.  Weight history:  Wt Readings from Last 3 Encounters:  04/26/14 155 lb 9.6 oz (70.58 kg)  03/15/14 154 lb 12.8 oz (70.217 kg)  03/08/14 156 lb (70.761 kg)   Glycemic control:  Lab Results  Component Value Date   HGBA1C 8.6* 03/08/2014   HGBA1C 7.4* 11/10/2013   HGBA1C 8.1* 08/12/2013   Lab Results  Component Value Date   MICROALBUR 0.8 03/13/2013   LDLCALC 91 03/08/2014   CREATININE 2.7* 03/08/2014          Medication List       This list is accurate as of: 04/26/14  8:04 AM.  Always use your most recent med list.               atenolol 100 MG tablet  Commonly known as:  TENORMIN  Take 1 tablet by mouth daily.     atorvastatin 10 MG tablet  Commonly known as:  LIPITOR  TAKE 1/2 TABLET (5 MG TOTAL) BY MOUTH DAILY.  calcium-vitamin D 500 MG tablet  Take 1 tablet by mouth 2 (two) times daily.     furosemide 80 MG tablet  Commonly known as:  LASIX  Take 80 mg by mouth.     glipiZIDE 5 MG 24 hr tablet  Commonly known as:  GLUCOTROL XL  TAKE 1 TABLET (5 MG TOTAL) BY MOUTH DAILY WITH BREAKFAST.     glucose blood test strip  Commonly known as:  ONE TOUCH ULTRA TEST  Test blood sugar 3 times a day after each meal and 2 times a week in the morning as instructed. Dx code: 250.00     KLOR-CON M20 20 MEQ tablet  Generic drug:  potassium chloride SA  TAKE 1 TABLET BY MOUTH DAILY     linagliptin 5 MG Tabs tablet  Commonly known as:  TRADJENTA  Take 1 tablet (5 mg total) by mouth daily.     onetouch ultrasoft lancets  Test blood sugar 3 times a day after each meal and 2 times a week in the morning as instructed. Dx code: 250.00     pioglitazone 30 MG tablet  Commonly known as:  ACTOS  TAKE 1 TABLET (30 MG TOTAL) BY MOUTH DAILY. PT WANTS 90 DAY SUPPLY ON ALL MEDICINE         Allergies: No Known Allergies  Past Medical History  Diagnosis Date  . Hypertension   . Diabetes mellitus   . Hyperlipidemia   . Prostate cancer   . Renal insufficiency   . Cataract     Past Surgical History  Procedure Laterality Date  . Prostate surgery      radiation therapy only    Family History  Problem Relation Age of Onset  . Diabetes Father   . Diabetes Sister   . Hypertension Sister   . Hypertension Brother   . Heart disease Neg Hx     Social History:  reports that he has quit smoking. He does not have any smokeless tobacco history on file. His alcohol and drug histories are not on file.    Review of Systems       Lipids: Currently taking 10 mg Lipitor. 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 He has been told that he has a cataract since he was young                The blood pressure has been Controlled with medications for several years and he is  taking atenolol 100 mg only.   Previously lisinopril was stopped and he is going to followup with nephrologist periodically especially since he has CKD   LABS:  Appointment on 04/21/2014  Component Date Value Ref Range Status  . Fructosamine 04/21/2014 364* 190 - 270 umol/L Final  . Glucose, Bld 04/21/2014 153* 70 - 99 mg/dL Final    Physical Examination:  BP 122/71 mmHg  Pulse 84  Temp(Src) 98.2 F (36.8 C)  Resp 14  Ht 5' 8.5" (1.74 m)  Wt 155 lb 9.6 oz (70.58 kg)  BMI 23.31 kg/m2  SpO2 96%  No  edema  ASSESSMENT:  1. Diabetes type 2, last A1c 8.6 Although he has started taking the Tradjenta his  blood sugars are difficult to assess as he does not monitor at home His fructosamine level indicates overall high readings, was previously 311 Because of his poor  diabetes likely be getting postprandial hyperglycemia Emphasized the need to start working with the dietitian and modify his diet: He is aware of what he needs to do but does not implement this Also was given a new glucose monitor to start checking his blood sugar periodically especially after meals He does need to start exercising and he again was told to start going to the [facility] and do some brisk walking there.  His wife Appointment was made father dietitian to see him this week  2. Renal failure with GFR about 30, stable and followed by a nephrologist   PLAN:    As above, avoid simple sugars and reduce fast food  Exercise regimen and glucose monitoring as discussed today  Follow-up in 2 months  Taima Rada 04/26/2014, 8:04 AM

## 2014-04-26 NOTE — Patient Instructions (Signed)
Please check blood sugars at least half the time about 2 hours after any meal and 2 times per week on waking up. Please bring blood sugar monitor to each visit   No Sweet tea  [facility]

## 2014-04-29 ENCOUNTER — Encounter: Payer: Medicare Other | Admitting: Dietician

## 2014-05-06 ENCOUNTER — Encounter: Payer: Medicare Other | Attending: Endocrinology | Admitting: Dietician

## 2014-05-06 VITALS — Ht 68.5 in | Wt 155.0 lb

## 2014-05-06 DIAGNOSIS — Z6823 Body mass index (BMI) 23.0-23.9, adult: Secondary | ICD-10-CM | POA: Insufficient documentation

## 2014-05-06 DIAGNOSIS — Z79899 Other long term (current) drug therapy: Secondary | ICD-10-CM | POA: Diagnosis not present

## 2014-05-06 DIAGNOSIS — E1165 Type 2 diabetes mellitus with hyperglycemia: Secondary | ICD-10-CM | POA: Insufficient documentation

## 2014-05-06 DIAGNOSIS — Z713 Dietary counseling and surveillance: Secondary | ICD-10-CM | POA: Insufficient documentation

## 2014-05-06 DIAGNOSIS — E119 Type 2 diabetes mellitus without complications: Secondary | ICD-10-CM

## 2014-05-06 NOTE — Patient Instructions (Signed)
Begin exercising as able.  Goal of 150 minutes per week. Rethink you drink.  Choose unsweetened tea rather than sweetened. Try Monia Pouch (stevia) Consider checking Blood Sugars. Consider avoiding processed meats to help reduce sodium intake.

## 2014-05-06 NOTE — Progress Notes (Signed)
  Medical Nutrition Therapy:  Appt start time: 0800 end time:  0900.   Assessment:  Primary concerns today:  Uncontrolled DM.  HgbA1C 8.6% (03/08/14).  Increased from 7.4 (11/10/13).  Patient is here alone.  Lives with wife.  Both do cooking and shopping but often eat out.  Retired Biomedical scientist.    He does not check CBG's "afraid of needles'. Was testing on tip of finger.  Suggested sticking on the side of finger.  Preferred Learning Style:   No preference indicated   Learning Readiness:   Contemplating  Ready  MEDICATIONS: see list   DIETARY INTAKE:  24-hr recall:  B ( AM): sausage, grits, eggs, and 3 slices of toast with sweet tea  Snk ( AM): none  L ( PM): 2 hot dogs or hamburger Snk ( PM): potato chips or corn chips or clementines or chocolate chip cookies or ice cream (klondike) D ( PM): meat, starch, vege Snk ( PM): maybe ice cream Beverages: water, milk, sweet tea, 1-2 regular soda per week (used to have 3-4 per day)  Usual physical activity: walks when weather is good.  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  Estimated energy needs: 1800 calories 200 g carbohydrates 113 g protein 60 g fat  Progress Towards Goal(s):  In progress.   Nutritional Diagnosis:  NB-1.1 Food and nutrition-related knowledge deficit As related to balance of carbohydrate, protein, and fats.  As evidenced by diet hx.    Intervention:  Nutrition counseling and diabetes education initiated. Discussed Carb Counting by food group as method of portion control, reading food labels, and benefits of increased activity. Also discussed basic physiology of Diabetes, target BG ranges pre and post meals, and A1c.   Begin exercising as able.  Goal of 150 minutes per week. Rethink you drink.  Choose unsweetened tea rather than sweetened. Try Monia Pouch (stevia) Consider checking Blood Sugars. Consider avoiding processed meats to help reduce sodium intake.  Teaching Method Utilized:   Visual Auditory  Handouts given during visit include: Living Well with Diabetes Food Label handouts HgbA1C sheet Meal Plan Card Low sodium from the nutrition care manual  Barriers to learning/adherence to lifestyle change: decreased motivation to change  Demonstrated degree of understanding via:  Teach Back   Monitoring/Evaluation:  Dietary intake, exercise, label reading, and body weight prn.

## 2014-05-27 DIAGNOSIS — I10 Essential (primary) hypertension: Secondary | ICD-10-CM | POA: Diagnosis not present

## 2014-05-27 DIAGNOSIS — N183 Chronic kidney disease, stage 3 (moderate): Secondary | ICD-10-CM | POA: Diagnosis not present

## 2014-05-31 ENCOUNTER — Other Ambulatory Visit: Payer: Self-pay | Admitting: Endocrinology

## 2014-06-08 ENCOUNTER — Other Ambulatory Visit: Payer: Self-pay | Admitting: Endocrinology

## 2014-06-09 ENCOUNTER — Other Ambulatory Visit: Payer: Self-pay | Admitting: Endocrinology

## 2014-06-22 ENCOUNTER — Other Ambulatory Visit (INDEPENDENT_AMBULATORY_CARE_PROVIDER_SITE_OTHER): Payer: Medicare Other

## 2014-06-22 DIAGNOSIS — E1165 Type 2 diabetes mellitus with hyperglycemia: Secondary | ICD-10-CM | POA: Diagnosis not present

## 2014-06-22 DIAGNOSIS — E785 Hyperlipidemia, unspecified: Secondary | ICD-10-CM

## 2014-06-22 DIAGNOSIS — IMO0002 Reserved for concepts with insufficient information to code with codable children: Secondary | ICD-10-CM

## 2014-06-22 LAB — BASIC METABOLIC PANEL
BUN: 22 mg/dL (ref 6–23)
CALCIUM: 10 mg/dL (ref 8.4–10.5)
CHLORIDE: 105 meq/L (ref 96–112)
CO2: 29 mEq/L (ref 19–32)
CREATININE: 2.36 mg/dL — AB (ref 0.40–1.50)
GFR: 34.94 mL/min — AB (ref >60.00)
Glucose, Bld: 111 mg/dL — ABNORMAL HIGH (ref 70–99)
Potassium: 3.8 mEq/L (ref 3.5–5.1)
Sodium: 142 mEq/L (ref 135–145)

## 2014-06-22 LAB — LDL CHOLESTEROL, DIRECT: LDL DIRECT: 92 mg/dL

## 2014-06-22 LAB — HEMOGLOBIN A1C: HEMOGLOBIN A1C: 7.4 % — AB (ref 4.6–6.5)

## 2014-06-25 ENCOUNTER — Encounter: Payer: Self-pay | Admitting: Endocrinology

## 2014-06-25 ENCOUNTER — Ambulatory Visit (INDEPENDENT_AMBULATORY_CARE_PROVIDER_SITE_OTHER): Payer: Medicare Other | Admitting: Endocrinology

## 2014-06-25 VITALS — BP 132/84 | HR 66 | Temp 97.7°F | Ht 68.5 in | Wt 162.0 lb

## 2014-06-25 DIAGNOSIS — IMO0002 Reserved for concepts with insufficient information to code with codable children: Secondary | ICD-10-CM

## 2014-06-25 DIAGNOSIS — E1165 Type 2 diabetes mellitus with hyperglycemia: Secondary | ICD-10-CM

## 2014-06-25 DIAGNOSIS — N184 Chronic kidney disease, stage 4 (severe): Secondary | ICD-10-CM

## 2014-06-25 DIAGNOSIS — E785 Hyperlipidemia, unspecified: Secondary | ICD-10-CM

## 2014-06-25 NOTE — Progress Notes (Signed)
Patient ID: [name], male   DOB: [birth year], 74 y.o.   MRN: [id number]   Reason for Appointment : Followup of  Type 2 Diabetes  History of Present Illness          Diagnosis: Type 2 diabetes mellitus, date of diagnosis:   2001     Past history: He has had long-standing diabetes treated with various oral hypoglycemic drugs.  He has not been on metformin because of renal dysfunction He thinks he was given Actos for sometime but not clear if he was benefiting. This was stopped probably in 2012 but he does not think he had any side effects. He thinks he has been taking Tradjenta for a couple of years  His A1c has been monitored about once or twice a year as per records  Because of worsening A1c in 12/14 he was referred for further evaluation. Because of baseline A1c of 8.6% in 12/14 he was started on glipizide ER 5 mg and pioglitazone 30 mg daily in addition to his Tradjenta He was also started on home glucose monitoring with a One Touch monitor  Recent history:  Previously his A1c was back up to 8.6 when he left off his Tradjenta He is taking a 3 drug regimen including Actos and glipizide. He has seen the dietitian in 1/16 but not sure if he has made many changes in his diet He still drinks several drinks with sugar and refuses to consider diet drinks.  However he thinks he is cutting back on these and juices Not clear why he has gained weight He thinks he is starting to walk at least twice a week A1c is improving He will not check his blood sugar at home because of fear of needles and he thinks that he has difficulty getting her blood sample Fasting glucose in the lab was near normal    Glucose monitoring:  was using a  One Touch ultra mini monitor. Does not check his glucose readings usually Hypoglycemia: None    Self-care: The diet that the patient has been following is: None, see above     Meals: 3 meals per day. Still has some sweet tea; some milk  and juice         Exercise:  is walking about twice a week        Dietician visit:  1/16. CDE 5/15             Weight history:  Wt Readings from Last 3 Encounters:  06/25/14 162 lb (73.483 kg)  05/06/14 155 lb (70.308 kg)  04/26/14 155 lb 9.6 oz (70.58 kg)   Glycemic control:  Lab Results  Component Value Date   HGBA1C 7.4* 06/22/2014   HGBA1C 8.6* 03/08/2014   HGBA1C 7.4* 11/10/2013   Lab Results  Component Value Date   MICROALBUR 0.8 03/13/2013   LDLCALC 91 03/08/2014   CREATININE 2.36* 06/22/2014          Medication List       This list is accurate as of: 06/25/14  8:05 AM.  Always use your most recent med list.               atenolol 100 MG tablet  Commonly known as:  TENORMIN  Take 1 tablet by mouth daily.     atorvastatin 10 MG tablet  Commonly known as:  LIPITOR  TAKE 1/2 TABLET (5 MG TOTAL) BY MOUTH DAILY.     calcium-vitamin D 500 MG tablet  Take 1 tablet  by mouth 2 (two) times daily.     furosemide 80 MG tablet  Commonly known as:  LASIX  Take 80 mg by mouth.     glipiZIDE 5 MG 24 hr tablet  Commonly known as:  GLUCOTROL XL  TAKE 1 TABLET (5 MG TOTAL) BY MOUTH DAILY WITH BREAKFAST.     glucose blood test strip  Commonly known as:  ONE TOUCH ULTRA TEST  Test blood sugar once a day . Dx code: E11.9     KLOR-CON M20 20 MEQ tablet  Generic drug:  potassium chloride SA  TAKE 1 TABLET BY MOUTH DAILY     linagliptin 5 MG Tabs tablet  Commonly known as:  TRADJENTA  Take 1 tablet (5 mg total) by mouth daily.     ONETOUCH DELICA LANCETS FINE Misc  Use to check blood sugar once a day     pioglitazone 30 MG tablet  Commonly known as:  ACTOS  TAKE 1 TABLET BY MOUTH EVERY DAY     pioglitazone 30 MG tablet  Commonly known as:  ACTOS  TAKE 1 TABLET BY MOUTH EVERY DAY        Allergies: No Known Allergies  Past Medical History  Diagnosis Date  . Hypertension   . Diabetes mellitus   . Hyperlipidemia   . Prostate cancer   . Renal insufficiency   .  Cataract     Past Surgical History  Procedure Laterality Date  . Prostate surgery      radiation therapy only    Family History  Problem Relation Age of Onset  . Diabetes Father   . Diabetes Sister   . Hypertension Sister   . Hypertension Brother   . Heart disease Neg Hx     Social History:  reports that he has quit smoking. He does not have any smokeless tobacco history on file. His alcohol and drug histories are not on file.    Review of Systems       Lipids: Currently taking 10 mg Lipitor. 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 He has been told that he has a cataract since he was young                The blood pressure has been Controlled with medications for several years and he is  taking atenolol 100 mg only.   Previously lisinopril was stopped and he is going to followup with nephrologist periodically especially since he has CKD   LABS:  Lab on 06/22/2014  Component Date Value Ref Range Status  . Hgb A1c MFr Bld 06/22/2014 7.4* 4.6 - 6.5 % Final   Glycemic Control Guidelines for People with Diabetes:Non Diabetic:  <6%Goal of Therapy: <7%Additional Action Suggested:  >8%   . Sodium 06/22/2014 142  135 - 145 mEq/L Final  . Potassium 06/22/2014 3.8  3.5 - 5.1 mEq/L Final  . Chloride 06/22/2014 105  96 - 112 mEq/L Final  . CO2 06/22/2014 29  19 - 32 mEq/L Final  . Glucose, Bld 06/22/2014 111* 70 - 99 mg/dL Final  . BUN 06/22/2014 22  6 - 23 mg/dL Final  . Creatinine, Ser 06/22/2014 2.36* 0.40 - 1.50  mg/dL Final  . Calcium 06/22/2014 10.0  8.4 - 10.5 mg/dL Final  . GFR 06/22/2014 34.94* >60.00 mL/min Final  . Direct LDL 06/22/2014 92.0   Final   Optimal:  <100 mg/dLNear or Above Optimal:  100-129 mg/dLBorderline High:  130-159  mg/dLHigh:  160-189 mg/dLVery High:  >190 mg/dL    Physical Examination:  BP 132/84 mmHg  Pulse 66  Temp(Src) 97.7 F (36.5 C) (Oral)  Ht 5' 8.5" (1.74 m)  Wt 162 lb (73.483 kg)  BMI 24.27 kg/m2  SpO2 97%  No  edema  ASSESSMENT:  1. Diabetes type 2, nonobese His A1c is down to 7.4% which is improved and is fasting blood sugar is only 111 Was likely is getting postprandial hyperglycemia and also high readings after drinking sweet drinks Despite session with the dietitian he is still not able to keep his weight down and eliminate sweet drinks He has started walking a little Does not check his blood sugar at home and finds it difficult to do this  For now we'll continue him on the same regimen but encouraged him to increase his walking  2. Renal failure with GFR  34 stable and followed by a nephrologist  His last PTH was about 49 and not clear if he is on active vitamin D He is taking calcium supplements for several years and since his calcium level is high normal will have him stop this  3.  Hypertension: Appears to be fairly well controlled.  He tends to have whitecoat hypertension and anxiety in the office  PLAN:    As above, avoid simple sugars in drinks   Exercise at least every other day  A1c again in 3 months  Stop calcium supplements  Aune Adami 06/25/2014, 8:05 AM

## 2014-06-25 NOTE — Patient Instructions (Signed)
Continue walking  Stop all sweet drinks  Stop calcium

## 2014-07-05 ENCOUNTER — Ambulatory Visit: Payer: Medicare Other | Admitting: Family

## 2014-07-06 ENCOUNTER — Ambulatory Visit (INDEPENDENT_AMBULATORY_CARE_PROVIDER_SITE_OTHER): Payer: Medicare Other | Admitting: Family

## 2014-07-06 ENCOUNTER — Encounter: Payer: Self-pay | Admitting: Family

## 2014-07-06 VITALS — BP 124/82 | HR 87 | Temp 98.2°F | Ht 68.5 in | Wt 160.8 lb

## 2014-07-06 DIAGNOSIS — I10 Essential (primary) hypertension: Secondary | ICD-10-CM | POA: Diagnosis not present

## 2014-07-06 DIAGNOSIS — E7800 Pure hypercholesterolemia, unspecified: Secondary | ICD-10-CM

## 2014-07-06 DIAGNOSIS — N289 Disorder of kidney and ureter, unspecified: Secondary | ICD-10-CM | POA: Diagnosis not present

## 2014-07-06 DIAGNOSIS — E119 Type 2 diabetes mellitus without complications: Secondary | ICD-10-CM

## 2014-07-06 NOTE — Patient Instructions (Signed)
Fat and Cholesterol Control Diet Fat and cholesterol levels in your blood and organs are influenced by your diet. High levels of fat and cholesterol may lead to diseases of the heart, small and large blood vessels, gallbladder, liver, and pancreas. CONTROLLING FAT AND CHOLESTEROL WITH DIET Although exercise and lifestyle factors are important, your diet is key. That is because certain foods are known to raise cholesterol and others to lower it. The goal is to balance foods for their effect on cholesterol and more importantly, to replace saturated and trans fat with other types of fat, such as monounsaturated fat, polyunsaturated fat, and omega-3 fatty acids. On average, a person should consume no more than 15 to 17 g of saturated fat daily. Saturated and trans fats are considered "bad" fats, and they will raise LDL cholesterol. Saturated fats are primarily found in animal products such as meats, butter, and cream. However, that does not mean you need to give up all your favorite foods. Today, there are good tasting, low-fat, low-cholesterol substitutes for most of the things you like to eat. Choose low-fat or nonfat alternatives. Choose round or loin cuts of red meat. These types of cuts are lowest in fat and cholesterol. Chicken (without the skin), fish, veal, and ground turkey breast are great choices. Eliminate fatty meats, such as hot dogs and salami. Even shellfish have little or no saturated fat. Have a 3 oz (85 g) portion when you eat lean meat, poultry, or fish. Trans fats are also called "partially hydrogenated oils." They are oils that have been scientifically manipulated so that they are solid at room temperature resulting in a longer shelf life and improved taste and texture of foods in which they are added. Trans fats are found in stick margarine, some tub margarines, cookies, crackers, and baked goods.  When baking and cooking, oils are a great substitute for butter. The monounsaturated oils are  especially beneficial since it is believed they lower LDL and raise HDL. The oils you should avoid entirely are saturated tropical oils, such as coconut and palm.  Remember to eat a lot from food groups that are naturally free of saturated and trans fat, including fish, fruit, vegetables, beans, grains (barley, rice, couscous, bulgur wheat), and pasta (without cream sauces).  IDENTIFYING FOODS THAT LOWER FAT AND CHOLESTEROL  Soluble fiber may lower your cholesterol. This type of fiber is found in fruits such as apples, vegetables such as broccoli, potatoes, and carrots, legumes such as beans, peas, and lentils, and grains such as barley. Foods fortified with plant sterols (phytosterol) may also lower cholesterol. You should eat at least 2 g per day of these foods for a cholesterol lowering effect.  Read package labels to identify low-saturated fats, trans fat free, and low-fat foods at the supermarket. Select cheeses that have only 2 to 3 g saturated fat per ounce. Use a heart-healthy tub margarine that is free of trans fats or partially hydrogenated oil. When buying baked goods (cookies, crackers), avoid partially hydrogenated oils. Breads and muffins should be made from whole grains (whole-wheat or whole oat flour, instead of "flour" or "enriched flour"). Buy non-creamy canned soups with reduced salt and no added fats.  FOOD PREPARATION TECHNIQUES  Never deep-fry. If you must fry, either stir-fry, which uses very little fat, or use non-stick cooking sprays. When possible, broil, bake, or roast meats, and steam vegetables. Instead of putting butter or margarine on vegetables, use lemon and herbs, applesauce, and cinnamon (for squash and sweet potatoes). Use nonfat   yogurt, salsa, and low-fat dressings for salads.  LOW-SATURATED FAT / LOW-FAT FOOD SUBSTITUTES Meats / Saturated Fat (g)  Avoid: Steak, marbled (3 oz/85 g) / 11 g  Choose: Steak, lean (3 oz/85 g) / 4 g  Avoid: Hamburger (3 oz/85 g) / 7  g  Choose: Hamburger, lean (3 oz/85 g) / 5 g  Avoid: Ham (3 oz/85 g) / 6 g  Choose: Ham, lean cut (3 oz/85 g) / 2.4 g  Avoid: Chicken, with skin, dark meat (3 oz/85 g) / 4 g  Choose: Chicken, skin removed, dark meat (3 oz/85 g) / 2 g  Avoid: Chicken, with skin, light meat (3 oz/85 g) / 2.5 g  Choose: Chicken, skin removed, light meat (3 oz/85 g) / 1 g Dairy / Saturated Fat (g)  Avoid: Whole milk (1 cup) / 5 g  Choose: Low-fat milk, 2% (1 cup) / 3 g  Choose: Low-fat milk, 1% (1 cup) / 1.5 g  Choose: Skim milk (1 cup) / 0.3 g  Avoid: Hard cheese (1 oz/28 g) / 6 g  Choose: Skim milk cheese (1 oz/28 g) / 2 to 3 g  Avoid: Cottage cheese, 4% fat (1 cup) / 6.5 g  Choose: Low-fat cottage cheese, 1% fat (1 cup) / 1.5 g  Avoid: Ice cream (1 cup) / 9 g  Choose: Sherbet (1 cup) / 2.5 g  Choose: Nonfat frozen yogurt (1 cup) / 0.3 g  Choose: Frozen fruit bar / trace  Avoid: Whipped cream (1 tbs) / 3.5 g  Choose: Nondairy whipped topping (1 tbs) / 1 g Condiments / Saturated Fat (g)  Avoid: Mayonnaise (1 tbs) / 2 g  Choose: Low-fat mayonnaise (1 tbs) / 1 g  Avoid: Butter (1 tbs) / 7 g  Choose: Extra light margarine (1 tbs) / 1 g  Avoid: Coconut oil (1 tbs) / 11.8 g  Choose: Olive oil (1 tbs) / 1.8 g  Choose: Corn oil (1 tbs) / 1.7 g  Choose: Safflower oil (1 tbs) / 1.2 g  Choose: Sunflower oil (1 tbs) / 1.4 g  Choose: Soybean oil (1 tbs) / 2.4 g  Choose: Canola oil (1 tbs) / 1 g Document Released: 03/26/2005 Document Revised: 07/21/2012 Document Reviewed: 06/24/2013 Exit[employer] Patient Information 2015 [employer]. This information is not intended to replace advice given to you by your health care provider. Make sure you discuss any questions you have with your health care provider.  

## 2014-07-06 NOTE — Progress Notes (Signed)
Pre visit review using our clinic review tool, if applicable. No additional management support is needed unless otherwise documented below in the visit note. 

## 2014-07-06 NOTE — Progress Notes (Signed)
Subjective:    Patient ID: [name], male    DOB: [birth year], 74 y.o.   MRN: [id number]  HPI 74 year old African-American male, nonsmoker with a history of type 2 diabetes, hyperlipidemia, visual impairment left eye, hypertension, and renal insufficiency is in today for recheck. He is under the care of nephrology, urology, endocrinology and sees specialist as scheduled. Has not had a diabetic eye exam. Suggested he have that done as soon as possible. Does not routinely check his blood glucose. Does try to follow healthy diet.   Review of Systems  Constitutional: Negative.   HENT: Negative.   Respiratory: Negative.   Cardiovascular: Negative.   Gastrointestinal: Negative.   Endocrine: Negative.   Genitourinary: Negative.   Musculoskeletal: Negative.   Skin: Negative.   Neurological: Negative.   Hematological: Negative.   Psychiatric/Behavioral: Negative.    Past Medical History  Diagnosis Date  . Hypertension   . Diabetes mellitus   . Hyperlipidemia   . Prostate cancer   . Renal insufficiency   . Cataract     History   Social History  . Marital Status: Married    Spouse Name: N/A  . Number of Children: N/A  . Years of Education: N/A   Occupational History  . Not on file.   Social History Main Topics  . Smoking status: Former Research scientist (life sciences)  . Smokeless tobacco: Not on file  . Alcohol Use: Not on file  . Drug Use: Not on file  . Sexual Activity: Not on file   Other Topics Concern  . Not on file   Social History Narrative    Past Surgical History  Procedure Laterality Date  . Prostate surgery      radiation therapy only    Family History  Problem Relation Age of Onset  . Diabetes Father   . Diabetes Sister   . Hypertension Sister   . Hypertension Brother   . Heart disease Neg Hx     No Known Allergies  Current Outpatient Prescriptions on File Prior to Visit  Medication Sig Dispense Refill  . atenolol (TENORMIN) 100 MG tablet Take 50 tablets  by mouth daily.     Marland Kitchen atorvastatin (LIPITOR) 10 MG tablet TAKE 1/2 TABLET (5 MG TOTAL) BY MOUTH DAILY. 90 tablet 0  . furosemide (LASIX) 80 MG tablet Take 80 mg by mouth.    Marland Kitchen glipiZIDE (GLUCOTROL XL) 5 MG 24 hr tablet TAKE 1 TABLET (5 MG TOTAL) BY MOUTH DAILY WITH BREAKFAST. 90 tablet 1  . glucose blood (ONE TOUCH ULTRA TEST) test strip Test blood sugar once a day . Dx code: E11.9 50 each 3  . KLOR-CON M20 20 MEQ tablet TAKE 1 TABLET BY MOUTH DAILY 90 tablet 1  . linagliptin (TRADJENTA) 5 MG TABS tablet Take 1 tablet (5 mg total) by mouth daily. 90 tablet 1  . ONETOUCH DELICA LANCETS FINE MISC Use to check blood sugar once a day 100 each 2  . pioglitazone (ACTOS) 30 MG tablet TAKE 1 TABLET BY MOUTH EVERY DAY 90 tablet 1   No current facility-administered medications on file prior to visit.    BP 124/82 mmHg  Pulse 87  Temp(Src) 98.2 F (36.8 C) (Oral)  Ht 5' 8.5" (1.74 m)  Wt 160 lb 12.8 oz (72.938 kg)  BMI 24.09 kg/m2chart     Objective:   Physical Exam  Constitutional: He is oriented to person, place, and time. He appears well-developed and well-nourished.  HENT:  Right Ear: External ear normal.  Left Ear: External ear normal.  Mouth/Throat: Oropharynx is clear and moist.  Neck: Normal range of motion. Neck supple.  Cardiovascular: Normal rate, regular rhythm and normal heart sounds.   Pulmonary/Chest: Effort normal and breath sounds normal.  Abdominal: Soft. Bowel sounds are normal.  Musculoskeletal: Normal range of motion.  Neurological: He is alert and oriented to person, place, and time.  Skin: Skin is warm and dry.  Psychiatric: He has a normal mood and affect.          Assessment & Plan:  Cristhian was seen today for follow-up.  Diagnoses and all orders for this visit:  Essential hypertension  Pure hypercholesterolemia  Type 2 diabetes mellitus without complication  Renal insufficiency   Continue current medications. Again strongly encourage diabetic eye  exam. Follow with specialist as scheduled. Recheck in 6 months and sooner as needed.

## 2014-07-13 ENCOUNTER — Other Ambulatory Visit: Payer: Self-pay | Admitting: Family

## 2014-07-27 DIAGNOSIS — C61 Malignant neoplasm of prostate: Secondary | ICD-10-CM | POA: Diagnosis not present

## 2014-08-03 DIAGNOSIS — C61 Malignant neoplasm of prostate: Secondary | ICD-10-CM | POA: Diagnosis not present

## 2014-08-03 DIAGNOSIS — Z5111 Encounter for antineoplastic chemotherapy: Secondary | ICD-10-CM | POA: Diagnosis not present

## 2014-08-05 ENCOUNTER — Encounter: Payer: Self-pay | Admitting: Family Medicine

## 2014-08-05 ENCOUNTER — Ambulatory Visit (INDEPENDENT_AMBULATORY_CARE_PROVIDER_SITE_OTHER): Payer: Medicare Other | Admitting: Family Medicine

## 2014-08-05 VITALS — BP 138/88 | HR 76 | Temp 98.1°F | Wt 161.0 lb

## 2014-08-05 DIAGNOSIS — N259 Disorder resulting from impaired renal tubular function, unspecified: Secondary | ICD-10-CM

## 2014-08-05 DIAGNOSIS — M10331 Gout due to renal impairment, right wrist: Secondary | ICD-10-CM | POA: Diagnosis not present

## 2014-08-05 LAB — URIC ACID: URIC ACID, SERUM: 10.3 mg/dL — AB (ref 4.0–7.8)

## 2014-08-05 MED ORDER — PREDNISONE 10 MG PO TABS: ORAL_TABLET | ORAL | Status: DC

## 2014-08-05 NOTE — Progress Notes (Signed)
Pre visit review using our clinic review tool, if applicable. No additional management support is needed unless otherwise documented below in the visit note. 

## 2014-08-05 NOTE — Progress Notes (Addendum)
HPI:  [name] is a 73 yo M patient of Roxy C[facility] here for an acute visit for:  R wrist swelling and pain: -started acutely 2 days ago -no hx trauma or break in skin -reports: pain and swelling in R wrist - reports now seems to be better today - reports he did not think he needed to come to the doctor, mowed the lawn yesterday, but his wife made him come -denies: trauma, fevers, malaise, spreading or pain or redness, inability to move wrist, weakness, numbness, swelling elsewhere -report remote hx of gout -on ROC hx sig renal insufficiency - reports he is seeing nephrologist for this, DM seeing endocrinologist - her reports under good control, HTN, HLD  ROS: See pertinent positives and negatives per HPI.  Past Medical History  Diagnosis Date  . Hypertension   . Diabetes mellitus   . Hyperlipidemia   . Prostate cancer   . Renal insufficiency   . Cataract     Past Surgical History  Procedure Laterality Date  . Prostate surgery      radiation therapy only    Family History  Problem Relation Age of Onset  . Diabetes Father   . Diabetes Sister   . Hypertension Sister   . Hypertension Brother   . Heart disease Neg Hx     History   Social History  . Marital Status: Married    Spouse Name: N/A  . Number of Children: N/A  . Years of Education: N/A   Social History Main Topics  . Smoking status: Former Research scientist (life sciences)  . Smokeless tobacco: Not on file  . Alcohol Use: Not on file  . Drug Use: Not on file  . Sexual Activity: Not on file   Other Topics Concern  . None   Social History Narrative     Current outpatient prescriptions:  .  atenolol (TENORMIN) 100 MG tablet, Take 50 tablets by mouth daily. , Disp: , Rfl:  .  atorvastatin (LIPITOR) 10 MG tablet, TAKE 1/2 TABLET (5 MG TOTAL) BY MOUTH DAILY., Disp: 90 tablet, Rfl: 0 .  furosemide (LASIX) 80 MG tablet, Take 80 mg by mouth., Disp: , Rfl:  .  glipiZIDE (GLUCOTROL XL) 5 MG 24 hr tablet, TAKE 1 TABLET (5  MG TOTAL) BY MOUTH DAILY WITH BREAKFAST., Disp: 90 tablet, Rfl: 1 .  glucose blood (ONE TOUCH ULTRA TEST) test strip, Test blood sugar once a day . Dx code: E11.9, Disp: 50 each, Rfl: 3 .  KLOR-CON M20 20 MEQ tablet, TAKE 1 TABLET BY MOUTH DAILY, Disp: 90 tablet, Rfl: 1 .  linagliptin (TRADJENTA) 5 MG TABS tablet, Take 1 tablet (5 mg total) by mouth daily., Disp: 90 tablet, Rfl: 1 .  ONETOUCH DELICA LANCETS FINE MISC, Use to check blood sugar once a day, Disp: 100 each, Rfl: 2 .  pioglitazone (ACTOS) 30 MG tablet, TAKE 1 TABLET BY MOUTH EVERY DAY, Disp: 90 tablet, Rfl: 1 .  predniSONE (DELTASONE) 10 MG tablet, 30mg  (3 tablets)daily  for 3 days, then 20mg  (2 tablets) for 2 days, then 10mg  (1 tablet) for 2 days, Disp: 15 tablet, Rfl: 0  EXAM:  Filed Vitals:   08/05/14 1209  BP: 138/88  Pulse: 76  Temp: 98.1 F (36.7 C)    Body mass index is 24.12 kg/(m^2).  GENERAL: vitals reviewed and listed above, alert, oriented, appears well hydrated and in no acute distress  HEENT: atraumatic, conjunttiva clear, no obvious abnormalities on inspection of external nose and ears  NECK: no obvious masses on inspection  LUNGS: clear to auscultation bilaterally, no wheezes, rales or rhonchi, good air movement  CV: HRRR, no peripheral edema  MS: moves all extremities without noticeable abnormality, some erythema and swelling of R wrist - TTP in this area difusely, no weakness or numbness in UE bilat, no other jt swelling, no signs of skin breakage  PSYCH: pleasant and cooperative, no obvious depression or anxiety  ASSESSMENT AND PLAN:  Discussed the following assessment and plan:  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  Patient Instructions  BEFORE YOU LEAVE: -labs  -As we discussed, we have prescribed a new medication for you at this appointment. We discussed the common and serious potential adverse effects of this medication and you can review these and more with the pharmacist when you pick up your medication.  Please follow the instructions for use carefully and notify us immediately if you have any problems taking this medication.  Take the prednisone as instructed:  30mg  (3 tablets)daily  for 3 days, then 20mg  (2 tablets) for 2 days, then 10mg  (1 tablet) for 2 days  Seek care immediately if worsening, not improving or other symptoms - particularly if spreading of redness, fevers, feeling sick, etc.     Colin Benton R.

## 2014-08-05 NOTE — Patient Instructions (Signed)
BEFORE YOU LEAVE: -labs  -As we discussed, we have prescribed a new medication for you at this appointment. We discussed the common and serious potential adverse effects of this medication and you can review these and more with the pharmacist when you pick up your medication.  Please follow the instructions for use carefully and notify us immediately if you have any problems taking this medication.  Take the prednisone as instructed:  30mg  (3 tablets)daily  for 3 days, then 20mg  (2 tablets) for 2 days, then 10mg  (1 tablet) for 2 days  Seek care immediately if worsening, not improving or other symptoms - particularly if spreading of redness, fevers, feeling sick, etc.

## 2014-09-07 ENCOUNTER — Other Ambulatory Visit: Payer: Self-pay | Admitting: Endocrinology

## 2014-09-09 ENCOUNTER — Other Ambulatory Visit: Payer: Self-pay | Admitting: Family

## 2014-09-22 ENCOUNTER — Other Ambulatory Visit (INDEPENDENT_AMBULATORY_CARE_PROVIDER_SITE_OTHER): Payer: Medicare Other

## 2014-09-22 DIAGNOSIS — IMO0002 Reserved for concepts with insufficient information to code with codable children: Secondary | ICD-10-CM

## 2014-09-22 DIAGNOSIS — E1165 Type 2 diabetes mellitus with hyperglycemia: Secondary | ICD-10-CM

## 2014-09-22 LAB — URINALYSIS, ROUTINE W REFLEX MICROSCOPIC
Bilirubin Urine: NEGATIVE
Hgb urine dipstick: NEGATIVE
KETONES UR: NEGATIVE
LEUKOCYTES UA: NEGATIVE
NITRITE: NEGATIVE
RBC / HPF: NONE SEEN (ref 0–?)
SPECIFIC GRAVITY, URINE: 1.02 (ref 1.000–1.030)
Total Protein, Urine: NEGATIVE
URINE GLUCOSE: NEGATIVE
UROBILINOGEN UA: 0.2 (ref 0.0–1.0)
WBC, UA: NONE SEEN (ref 0–?)
pH: 5.5 (ref 5.0–8.0)

## 2014-09-22 LAB — MICROALBUMIN / CREATININE URINE RATIO
Creatinine,U: 193.9 mg/dL
MICROALB/CREAT RATIO: 0.4 mg/g (ref 0.0–30.0)
Microalb, Ur: <0.7 mg/dL (ref 0.0–1.9)

## 2014-09-22 LAB — COMPREHENSIVE METABOLIC PANEL
ALK PHOS: 86 U/L (ref 39–117)
ALT: 10 U/L (ref 0–53)
AST: 18 U/L (ref 0–37)
Albumin: 3.9 g/dL (ref 3.5–5.2)
BILIRUBIN TOTAL: 0.3 mg/dL (ref 0.2–1.2)
BUN: 26 mg/dL — ABNORMAL HIGH (ref 6–23)
CO2: 26 mEq/L (ref 19–32)
Calcium: 9.3 mg/dL (ref 8.4–10.5)
Chloride: 107 mEq/L (ref 96–112)
Creatinine, Ser: 2.6 mg/dL — ABNORMAL HIGH (ref 0.40–1.50)
GFR: 31.22 mL/min — ABNORMAL LOW (ref >60.00)
GLUCOSE: 134 mg/dL — AB (ref 70–99)
Potassium: 3.6 mEq/L (ref 3.5–5.1)
Sodium: 140 mEq/L (ref 135–145)
TOTAL PROTEIN: 6.9 g/dL (ref 6.0–8.3)

## 2014-09-22 LAB — HEMOGLOBIN A1C: Hgb A1c MFr Bld: 7.5 % — ABNORMAL HIGH (ref 4.6–6.5)

## 2014-09-27 ENCOUNTER — Encounter: Payer: Self-pay | Admitting: Endocrinology

## 2014-09-27 ENCOUNTER — Ambulatory Visit (INDEPENDENT_AMBULATORY_CARE_PROVIDER_SITE_OTHER): Payer: Medicare Other | Admitting: Endocrinology

## 2014-09-27 VITALS — BP 118/72 | HR 60 | Temp 97.9°F | Resp 16 | Ht 68.5 in | Wt 158.2 lb

## 2014-09-27 DIAGNOSIS — E1165 Type 2 diabetes mellitus with hyperglycemia: Secondary | ICD-10-CM | POA: Diagnosis not present

## 2014-09-27 DIAGNOSIS — N184 Chronic kidney disease, stage 4 (severe): Secondary | ICD-10-CM

## 2014-09-27 DIAGNOSIS — IMO0002 Reserved for concepts with insufficient information to code with codable children: Secondary | ICD-10-CM

## 2014-09-27 NOTE — Progress Notes (Signed)
Patient ID: [name], male   DOB: 01/14/1941, 74 y.o.   MRN: [id number]   Reason for Appointment : Followup of  Type 2 Diabetes  History of Present Illness          Diagnosis: Type 2 diabetes mellitus, date of diagnosis:   2001     Past history: He has had long-standing diabetes treated with various oral hypoglycemic drugs.  He has not been on metformin because of renal dysfunction He thinks he was given Actos for sometime but not clear if he was benefiting. This was stopped probably in 2012 but he does not think he had any side effects. He thinks he has been taking Tradjenta for a couple of years  His A1c has been monitored about once or twice a year as per records  Because of worsening A1c in 12/14 he was referred for further evaluation. Because of baseline A1c of 8.6% in 12/14 he was started on glipizide ER 5 mg and pioglitazone 30 mg daily in addition to his Tradjenta He was also started on home glucose monitoring with a One Touch monitor  Recent history:  He is taking a 3 drug regimen including Actos, Tradjenta and glipizide.  Previously blood sugars were not controlled without Tradjenta He has seen the dietitian in 1/16 and is trying to make some changes Also cutting back on regular soft drinks although still not avoiding sweetened tea He still drinks several drinks with sugar and refuses to consider diet drinks.  However he thinks he is cutting back on these and juices He thinks he is consistent with trying to walk at least twice a week Has lost a little weight A1c is stable but still over 7% He has finally started checking his blood sugars but only in the morning and sometimes 30 minutes after eating a biscuit Fasting glucose in the lab was 134  Glucose monitoring:   using a  One Touch ultra mini monitor about once a day.  Mean values apply above for all meters except median for One Touch  PRE-MEAL Am Lunch Dinner Bedtime Overall  Glucose range: 102-216       Mean/median: 145        Does not check his glucose readings 2 hr pc Hypoglycemia: None    Self-care: The diet that the patient has been following is: None, see above     Meals: 3 meals per day. Still has some sweet tea; less Cokes     Exercise:  is walking about 2-3x a week        Dietician visit:  1/16. CDE 5/15             Weight history:  Wt Readings from Last 3 Encounters:  09/27/14 158 lb 3.2 oz (71.759 kg)  08/05/14 161 lb (73.029 kg)  07/06/14 160 lb 12.8 oz (72.938 kg)   Glycemic control:  Lab Results  Component Value Date   HGBA1C 7.5* 09/22/2014   HGBA1C 7.4* 06/22/2014   HGBA1C 8.6* 03/08/2014   Lab Results  Component Value Date   MICROALBUR <0.7 09/22/2014   LDLCALC 91 03/08/2014   CREATININE 2.60* 09/22/2014          Medication List       This list is accurate as of: 09/27/14  8:34 AM.  Always use your most recent med list.               atenolol 100 MG tablet  Commonly known as:  TENORMIN  Take 50 tablets by mouth daily.     atorvastatin 10 MG tablet  Commonly known as:  LIPITOR  TAKE 1/2 TABLET (5 MG TOTAL) BY MOUTH DAILY.     furosemide 80 MG tablet  Commonly known as:  LASIX  Take 80 mg by mouth.     glipiZIDE 5 MG 24 hr tablet  Commonly known as:  GLUCOTROL XL  TAKE 1 TABLET (5 MG TOTAL) BY MOUTH DAILY WITH BREAKFAST.     glucose blood test strip  Commonly known as:  ONE TOUCH ULTRA TEST  Test blood sugar once a day . Dx code: E11.9     KLOR-CON M20 20 MEQ tablet  Generic drug:  potassium chloride SA  TAKE 1 TABLET BY MOUTH DAILY     ONETOUCH DELICA LANCETS FINE Misc  Use to check blood sugar once a day     pioglitazone 30 MG tablet  Commonly known as:  ACTOS  TAKE 1 TABLET BY MOUTH EVERY DAY     TRADJENTA 5 MG Tabs tablet  Generic drug:  linagliptin  TAKE 1 TABLET (5 MG TOTAL) BY MOUTH DAILY.        Allergies: No Known Allergies  Past Medical History  Diagnosis Date  . Hypertension   . Diabetes mellitus   .  Hyperlipidemia   . Prostate cancer   . Renal insufficiency   . Cataract     Past Surgical History  Procedure Laterality Date  . Prostate surgery      radiation therapy only    Family History  Problem Relation Age of Onset  . Diabetes Father   . Diabetes Sister   . Hypertension Sister   . Hypertension Brother   . Heart disease Neg Hx     Social History:  reports that he has quit smoking. He does not have any smokeless tobacco history on file. His alcohol and drug histories are not on file.    Review of Systems       Lipids: Currently taking 10 mg Lipitor. 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 He has been told that he has a cataract since he was young                The blood pressure has been controlled with medications for several years and he is  taking atenolol 100 mg only.   Previously lisinopril was stopped and he is going to followup with nephrologist periodically especially since he has CKD Also on Lasix  Hypercalcemia: His calcium was mildly increased on the last visit, back to normal now without any calcium supplements   LABS:  Lab on 09/22/2014  Component Date Value Ref Range Status  . Hgb A1c MFr Bld 09/22/2014 7.5* 4.6 - 6.5 % Final   Glycemic Control Guidelines for People with Diabetes:Non Diabetic:  <6%Goal of Therapy: <7%Additional Action Suggested:  >8%   . Sodium 09/22/2014 140  135 - 145 mEq/L Final  . Potassium 09/22/2014 3.6  3.5 - 5.1 mEq/L Final  . Chloride 09/22/2014 107  96 - 112 mEq/L Final  . CO2 09/22/2014 26  19 - 32 mEq/L Final  . Glucose, Bld 09/22/2014 134* 70 -  99 mg/dL Final  . BUN 09/22/2014 26* 6 - 23 mg/dL Final  . Creatinine, Ser 09/22/2014 2.60* 0.40 - 1.50 mg/dL Final  . Total Bilirubin  09/22/2014 0.3  0.2 - 1.2 mg/dL Final  . Alkaline Phosphatase 09/22/2014 86  39 - 117 U/L Final  . AST 09/22/2014 18  0 - 37 U/L Final  . ALT 09/22/2014 10  0 - 53 U/L Final  . Total Protein 09/22/2014 6.9  6.0 - 8.3 g/dL Final  . Albumin 09/22/2014 3.9  3.5 - 5.2 g/dL Final  . Calcium 09/22/2014 9.3  8.4 - 10.5 mg/dL Final  . GFR 09/22/2014 31.22* >60.00 mL/min Final  . Microalb, Ur 09/22/2014 <0.7  0.0 - 1.9 mg/dL Final  . Creatinine,U 09/22/2014 193.9   Final  . Microalb Creat Ratio 09/22/2014 0.4  0.0 - 30.0 mg/g Final  . Color, Urine 09/22/2014 YELLOW  Yellow;Lt. Yellow Final  . APPearance 09/22/2014 CLEAR  Clear Final  . Specific Gravity, Urine 09/22/2014 1.020  1.000-1.030 Final  . pH 09/22/2014 5.5  5.0 - 8.0 Final  . Total Protein, Urine 09/22/2014 NEGATIVE  Negative Final  . Urine Glucose 09/22/2014 NEGATIVE  Negative Final  . Ketones, ur 09/22/2014 NEGATIVE  Negative Final  . Bilirubin Urine 09/22/2014 NEGATIVE  Negative Final  . Hgb urine dipstick 09/22/2014 NEGATIVE  Negative Final  . Urobilinogen, UA 09/22/2014 0.2  0.0 - 1.0 Final  . Leukocytes, UA 09/22/2014 NEGATIVE  Negative Final  . Nitrite 09/22/2014 NEGATIVE  Negative Final  . WBC, UA 09/22/2014 none seen  0-2/hpf Final  . RBC / HPF 09/22/2014 none seen  0-2/hpf Final  . Squamous Epithelial / LPF 09/22/2014 Rare(0-4/hpf)  Rare(0-4/hpf) Final  . Hyaline Casts, UA 09/22/2014 Presence of* None Final    Physical Examination:  BP 118/72 mmHg  Pulse 60  Temp(Src) 97.9 F (36.6 C)  Resp 16  Ht 5' 8.5" (1.74 m)  Wt 158 lb 3.2 oz (71.759 kg)  BMI 23.70 kg/m2  SpO2 98%  No  edema  ASSESSMENT/PLAN: 1. Diabetes type 2, nonobese His A1c indicates fair control with the level now 7.5 Although he is overall improving his diet and trying to walk he still probably has some insulin deficiency as occasional blood sugars at home are over 200 after eating Most likely does have aortic postprandial readings which he is not  taking; only checking some readings about 30 minutes after breakfast  Discussed that he needs to alternate fasting and two-hour postprandial readings at different times If he has marked increase in postprandial readings consistently may consider mealtime insulin or changing Tradjenta to Victoza 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 Renal failure with fairly stable creatinine and electrolytes   3.  Hypertension: Appears to be fairly well controlled.    Patient Instructions  Check blood sugars on waking up .Marland Kitchen 2-3 .Marland Kitchen times a week Also check blood sugars about 2 hours after a meal and do this after different meals by rotation  Recommended blood sugar levels on waking up is 90-130 and about 2 hours after meal is 140-180 Please bring blood sugar monitor to each visit.      Snyder Colavito 09/27/2014, 8:34 AM

## 2014-09-27 NOTE — Patient Instructions (Addendum)
Check blood sugars on waking up .. 2-3 .. times a week  Also check blood sugars about 2 hours after a meal and do this after different meals by rotation  Recommended blood sugar levels on waking up is 90-130 and about 2 hours after meal is 140-180 Please bring blood sugar monitor to each visit.  

## 2014-09-29 DIAGNOSIS — I10 Essential (primary) hypertension: Secondary | ICD-10-CM | POA: Diagnosis not present

## 2014-09-29 DIAGNOSIS — N183 Chronic kidney disease, stage 3 (moderate): Secondary | ICD-10-CM | POA: Diagnosis not present

## 2014-11-10 ENCOUNTER — Ambulatory Visit (INDEPENDENT_AMBULATORY_CARE_PROVIDER_SITE_OTHER): Payer: Medicare Other | Admitting: Family

## 2014-11-10 ENCOUNTER — Encounter: Payer: Self-pay | Admitting: Family

## 2014-11-10 VITALS — BP 128/70 | HR 77 | Temp 98.5°F | Resp 18 | Ht 68.0 in | Wt 157.4 lb

## 2014-11-10 DIAGNOSIS — E119 Type 2 diabetes mellitus without complications: Secondary | ICD-10-CM

## 2014-11-10 DIAGNOSIS — M19072 Primary osteoarthritis, left ankle and foot: Secondary | ICD-10-CM

## 2014-11-10 MED ORDER — MELOXICAM 7.5 MG PO TABS: 7.5000 mg | ORAL_TABLET | Freq: Every day | ORAL | Status: DC

## 2014-11-10 NOTE — Patient Instructions (Signed)

## 2014-11-10 NOTE — Progress Notes (Signed)
Pre visit review using our clinic review tool, if applicable. No additional management support is needed unless otherwise documented below in the visit note. 

## 2014-11-10 NOTE — Progress Notes (Signed)
Subjective:    Patient ID: [name], male    DOB: 04-27-1940, 74 y.o.   MRN: [id number]  HPI  74 year old African-American male, nonsmoker with a history of type 2 diabetes, hypertension, and hyperlipidemia. Has complaints of left foot pain 3-4 days that tends to be worse when he first gets up from a sitting position. Describes it as stiff. Pain a 6 out of 10. Eventually is relieved with walking. Has concerns of gout due to a history but denies any swelling or redness. Has not taken any medication for relief.    Review of Systems  Constitutional: Negative.   HENT: Negative.   Respiratory: Negative.   Cardiovascular: Negative.   Gastrointestinal: Negative.   Endocrine: Negative.   Genitourinary: Negative.   Musculoskeletal: Positive for arthralgias.       Left foot pain with walking. No swelling   Skin: Negative.   Allergic/Immunologic: Negative.   Neurological: Negative.   Hematological: Negative.   Psychiatric/Behavioral: Negative.    Past Medical History  Diagnosis Date  . Hypertension   . Diabetes mellitus   . Hyperlipidemia   . Prostate cancer   . Renal insufficiency   . Cataract     History   Social History  . Marital Status: Married    Spouse Name: N/A  . Number of Children: N/A  . Years of Education: N/A   Occupational History  . Not on file.   Social History Main Topics  . Smoking status: Former Research scientist (life sciences)  . Smokeless tobacco: Not on file  . Alcohol Use: Not on file  . Drug Use: Not on file  . Sexual Activity: Not on file   Other Topics Concern  . Not on file   Social History Narrative    Past Surgical History  Procedure Laterality Date  . Prostate surgery      radiation therapy only    Family History  Problem Relation Age of Onset  . Diabetes Father   . Diabetes Sister   . Hypertension Sister   . Hypertension Brother   . Heart disease Neg Hx     No Known Allergies  Current Outpatient Prescriptions on File Prior to Visit    Medication Sig Dispense Refill  . atenolol (TENORMIN) 100 MG tablet Take 50 tablets by mouth daily.     Marland Kitchen atorvastatin (LIPITOR) 10 MG tablet TAKE 1/2 TABLET (5 MG TOTAL) BY MOUTH DAILY. 90 tablet 0  . furosemide (LASIX) 80 MG tablet Take 80 mg by mouth.    Marland Kitchen glipiZIDE (GLUCOTROL XL) 5 MG 24 hr tablet TAKE 1 TABLET (5 MG TOTAL) BY MOUTH DAILY WITH BREAKFAST. 90 tablet 1  . glucose blood (ONE TOUCH ULTRA TEST) test strip Test blood sugar once a day . Dx code: E11.9 50 each 3  . KLOR-CON M20 20 MEQ tablet TAKE 1 TABLET BY MOUTH DAILY 90 tablet 1  . ONETOUCH DELICA LANCETS FINE MISC Use to check blood sugar once a day 100 each 2  . TRADJENTA 5 MG TABS tablet TAKE 1 TABLET (5 MG TOTAL) BY MOUTH DAILY. 90 tablet 1   No current facility-administered medications on file prior to visit.    BP 128/70 mmHg  Pulse 77  Temp(Src) 98.5 F (36.9 C) (Oral)  Resp 18  Ht 5\' 8"  (1.727 m)  Wt 157 lb 6.4 oz (71.396 kg)  BMI 23.94 kg/m2  SpO2 99%chart    Objective:   Physical Exam  Constitutional: He is oriented to person, place,  and time. He appears well-developed and well-nourished.  HENT:  Right Ear: External ear normal.  Left Ear: External ear normal.  Nose: Nose normal.  Mouth/Throat: Oropharynx is clear and moist.  Neck: Normal range of motion. Neck supple.  Cardiovascular: Normal rate, regular rhythm and normal heart sounds.   Pulmonary/Chest: Effort normal and breath sounds normal.  Abdominal: Bowel sounds are normal.  Musculoskeletal: He exhibits no edema or tenderness.  Neurological: He is alert and oriented to person, place, and time.  Skin: Skin is warm and dry.  Psychiatric: He has a normal mood and affect.          Assessment & Plan:  [name] was seen today for acute visit.  Diagnoses and all orders for this visit:  Primary osteoarthritis of left foot  Type 2 diabetes mellitus without complication  Other orders -     meloxicam (MOBIC) 7.5 MG tablet; Take 1 tablet (7.5  mg total) by mouth daily.   Call the office with any questions or concerns. Advised to take Mobic with food.

## 2014-11-25 ENCOUNTER — Other Ambulatory Visit: Payer: Self-pay | Admitting: Endocrinology

## 2014-12-23 ENCOUNTER — Other Ambulatory Visit (INDEPENDENT_AMBULATORY_CARE_PROVIDER_SITE_OTHER): Payer: Medicare Other

## 2014-12-23 DIAGNOSIS — E119 Type 2 diabetes mellitus without complications: Secondary | ICD-10-CM | POA: Diagnosis not present

## 2014-12-23 DIAGNOSIS — IMO0002 Reserved for concepts with insufficient information to code with codable children: Secondary | ICD-10-CM

## 2014-12-23 DIAGNOSIS — E1165 Type 2 diabetes mellitus with hyperglycemia: Secondary | ICD-10-CM | POA: Diagnosis not present

## 2014-12-23 LAB — LIPID PANEL
CHOL/HDL RATIO: 3
Cholesterol: 149 mg/dL (ref 0–200)
HDL: 53.4 mg/dL (ref >39.00)
LDL CALC: 81 mg/dL (ref 0–99)
NonHDL: 95.36
TRIGLYCERIDES: 72 mg/dL (ref 0.0–149.0)
VLDL: 14.4 mg/dL (ref 0.0–40.0)

## 2014-12-23 LAB — HEMOGLOBIN A1C: Hgb A1c MFr Bld: 7.4 % — ABNORMAL HIGH (ref 4.6–6.5)

## 2014-12-27 LAB — FRUCTOSAMINE: Fructosamine: 299 umol/L — ABNORMAL HIGH (ref 190–270)

## 2014-12-28 ENCOUNTER — Encounter: Payer: Self-pay | Admitting: Endocrinology

## 2014-12-28 ENCOUNTER — Ambulatory Visit (INDEPENDENT_AMBULATORY_CARE_PROVIDER_SITE_OTHER): Payer: Medicare Other | Admitting: Endocrinology

## 2014-12-28 VITALS — BP 120/80 | HR 62 | Temp 98.0°F | Ht 68.0 in | Wt 157.0 lb

## 2014-12-28 DIAGNOSIS — E7800 Pure hypercholesterolemia, unspecified: Secondary | ICD-10-CM

## 2014-12-28 DIAGNOSIS — N184 Chronic kidney disease, stage 4 (severe): Secondary | ICD-10-CM | POA: Diagnosis not present

## 2014-12-28 DIAGNOSIS — E119 Type 2 diabetes mellitus without complications: Secondary | ICD-10-CM

## 2014-12-28 NOTE — Patient Instructions (Signed)
Walk 4 days per week  Check more sugars after meals  Use 1/2 unsweetened tea

## 2014-12-28 NOTE — Progress Notes (Signed)
Patient ID: [name], male   DOB: [birth year], 74 y.o.   MRN: [id number]   Reason for Appointment : Followup of  Type 2 Diabetes  History of Present Illness          Diagnosis: Type 2 diabetes mellitus, date of diagnosis:   2001     Past history: He has had long-standing diabetes treated with various oral hypoglycemic drugs.  He has not been on metformin because of renal dysfunction He thinks he was given Actos for sometime but not clear if he was benefiting. This was stopped probably in 2012 but he does not think he had any side effects. He thinks he has been taking Tradjenta for a couple of years  His A1c has been monitored about once or twice a year as per records  Because of worsening A1c in 12/14 he was referred for further evaluation. Because of baseline A1c of 8.6% in 12/14 he was started on glipizide ER 5 mg and pioglitazone 30 mg daily in addition to his Tradjenta He was also started on home glucose monitoring with a One Touch monitor  Recent history:  He is taking a 3 drug regimen including Actos, Tradjenta and glipizide.  Previously blood sugars were not controlled without Tradjenta   His control is still somewhat suboptimal with A1c 7.4% and not improving, fructosamine is however slightly better than usual  he is also very inconsistent with glucose monitoring and only started checking sugars 3 days ago  He still drinks iced tea with sugar and does not want to try diet drinks.  However he thinks he is cutting back on these and juices He thinks he is consistent with trying to walk but has not lost any weight He has seen the dietitian in 1/16  He has a few high readings and evenings and these are related to drinking sweet drinks. Fasting readings are relatively good  Glucose monitoring:   using a  One Touch ultra mini monitor  Mean values apply above for all meters except median for One Touch  PRE-MEAL Am Lunch Dinner  PCS  Overall  Glucose range: 97-165     208, 278    Mean/median: 122       Hypoglycemia: None    Self-care: The diet that the patient has been following is: None, see above     Meals: 3 meals per day. Still has some sweet tea; powerade and he says he likes these too much to stop    Exercise:  is walking about 2-3x a week        Dietician visit:  1/16. CDE 5/15             Weight history:  Wt Readings from Last 3 Encounters:  12/28/14 157 lb (71.215 kg)  11/10/14 157 lb 6.4 oz (71.396 kg)  09/27/14 158 lb 3.2 oz (71.759 kg)   Glycemic control:   Lab Results  Component Value Date   HGBA1C 7.4* 12/23/2014   HGBA1C 7.5* 09/22/2014   HGBA1C 7.4* 06/22/2014   Lab Results  Component Value Date   MICROALBUR <0.7 09/22/2014   LDLCALC 81 12/23/2014   CREATININE 2.60* 09/22/2014          Medication List       This list is accurate as of: 12/28/14  9:10 PM.  Always use your most recent med list.               atenolol 100 MG tablet  Commonly known  as:  TENORMIN  Take 50 tablets by mouth daily.     atorvastatin 10 MG tablet  Commonly known as:  LIPITOR  TAKE 1/2 TABLET (5 MG TOTAL) BY MOUTH DAILY.     furosemide 80 MG tablet  Commonly known as:  LASIX  Take 80 mg by mouth.     glipiZIDE 5 MG 24 hr tablet  Commonly known as:  GLUCOTROL XL  TAKE 1 TABLET BY MOUTH DAILY WITH BREAKFAST.     glucose blood test strip  Commonly known as:  ONE TOUCH ULTRA TEST  Test blood sugar once a day . Dx code: E11.9     KLOR-CON M20 20 MEQ tablet  Generic drug:  potassium chloride SA  TAKE 1 TABLET BY MOUTH DAILY     ONETOUCH DELICA LANCETS FINE Misc  Use to check blood sugar once a day     TRADJENTA 5 MG Tabs tablet  Generic drug:  linagliptin  TAKE 1 TABLET (5 MG TOTAL) BY MOUTH DAILY.        Allergies: No Known Allergies  Past Medical History  Diagnosis Date  . Hypertension   . Diabetes mellitus   . Hyperlipidemia   . Prostate cancer   . Renal insufficiency   . Cataract     Past Surgical History   Procedure Laterality Date  . Prostate surgery      radiation therapy only    Family History  Problem Relation Age of Onset  . Diabetes Father   . Diabetes Sister   . Hypertension Sister   . Hypertension Brother   . Heart disease Neg Hx     Social History:  reports that he has quit smoking. He does not have any smokeless tobacco history on file. His alcohol and drug histories are not on file.    Review of Systems       Lipids: Currently taking 10 mg Lipitor. 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 He has been told that he has a cataract since he was young                The blood pressure has been controlled with medications for several years and he is  taking atenolol 100 mg only.   Previously lisinopril was stopped  Followed by nephrologist Also on Lasix  CKD: Creatinine has been relatively stable   LABS:  Lab on 12/23/2014  Component Date Value Ref Range Status  . Hgb A1c MFr Bld 12/23/2014 7.4* 4.6 - 6.5 % Final   Glycemic Control Guidelines for People with Diabetes:Non Diabetic:  <6%Goal of Therapy: <7%Additional Action Suggested:  >8%   . Fructosamine 12/23/2014 299* 190 - 270 umol/L Final  . Cholesterol 12/23/2014 149  0 - 200 mg/dL Final   ATP III Classification       Desirable:  < 200 mg/dL               Borderline High:  200 - 239 mg/dL          High:  > = 240 mg/dL  . Triglycerides 12/23/2014 72.0  0.0 - 149.0 mg/dL Final   Normal:  <150 mg/dLBorderline High:  150 - 199  mg/dL  . HDL 12/23/2014 53.40  >39.00 mg/dL Final  . VLDL 12/23/2014 14.4  0.0 - 40.0 mg/dL Final  . LDL Cholesterol 12/23/2014 81  0 - 99 mg/dL Final  . Total CHOL/HDL Ratio 12/23/2014 3   Final                  Men          Women1/2 Average Risk     3.4           3.3Average Risk          5.0          4.42X Average Risk          9.6          7.13X Average Risk          15.0          11.0                      . NonHDL 12/23/2014 95.36   Final   NOTE:  Non-HDL goal should be 30 mg/dL higher than patient's LDL goal (i.e. LDL goal of < 70 mg/dL, would have non-HDL goal of < 100 mg/dL)    Physical Examination:  BP 120/80 mmHg  Pulse 62  Temp(Src) 98 F (36.7 C) (Oral)  Ht 5\' 8"  (1.727 m)  Wt 157 lb (71.215 kg)  BMI 23.88 kg/m2  SpO2 96%    ASSESSMENT/PLAN: 1. Diabetes type 2, nonobese His A1c indicates fair control with the level now 7.4 and about the same as usual He does have high readings when he goes off his diet and drinks drinks with sugar However he does not check his blood sugars enough Discussed checking blood sugars more often after meals Also he can try using half sweetened iced tea and avoiding other drinks with sugar Also encouraged him to increase his walking Follow-up in 4 months again  2. Renal failure with fairly stable creatinine and electrolytes   3.  Hypertension: Appears to be fairly well controlled.    Patient Instructions  Walk 4 days per week  Check more sugars after meals  Use 1/2 unsweetened tea     KUMAR,AJAY 12/28/2014, 9:10 PM

## 2015-01-09 ENCOUNTER — Other Ambulatory Visit: Payer: Self-pay | Admitting: Family

## 2015-03-04 ENCOUNTER — Other Ambulatory Visit: Payer: Self-pay | Admitting: Endocrinology

## 2015-03-04 ENCOUNTER — Other Ambulatory Visit: Payer: Self-pay | Admitting: Family

## 2015-04-07 ENCOUNTER — Encounter: Payer: Self-pay | Admitting: *Deleted

## 2015-04-11 ENCOUNTER — Other Ambulatory Visit: Payer: Self-pay | Admitting: Family

## 2015-04-26 ENCOUNTER — Other Ambulatory Visit (INDEPENDENT_AMBULATORY_CARE_PROVIDER_SITE_OTHER): Payer: Medicare Other

## 2015-04-26 DIAGNOSIS — E119 Type 2 diabetes mellitus without complications: Secondary | ICD-10-CM | POA: Diagnosis not present

## 2015-04-26 LAB — BASIC METABOLIC PANEL
BUN: 18 mg/dL (ref 6–23)
CALCIUM: 8.8 mg/dL (ref 8.4–10.5)
CO2: 25 meq/L (ref 19–32)
Chloride: 106 mEq/L (ref 96–112)
Creatinine, Ser: 2.52 mg/dL — ABNORMAL HIGH (ref 0.40–1.50)
GFR: 32.32 mL/min — AB (ref >60.00)
Glucose, Bld: 139 mg/dL — ABNORMAL HIGH (ref 70–99)
POTASSIUM: 4 meq/L (ref 3.5–5.1)
SODIUM: 141 meq/L (ref 135–145)

## 2015-04-26 LAB — HEMOGLOBIN A1C: HEMOGLOBIN A1C: 7.5 % — AB (ref 4.6–6.5)

## 2015-04-29 ENCOUNTER — Encounter: Payer: Self-pay | Admitting: Endocrinology

## 2015-04-29 ENCOUNTER — Ambulatory Visit (INDEPENDENT_AMBULATORY_CARE_PROVIDER_SITE_OTHER): Payer: Medicare Other | Admitting: Endocrinology

## 2015-04-29 VITALS — BP 134/82 | HR 73 | Temp 97.9°F | Resp 16 | Ht 68.0 in | Wt 155.8 lb

## 2015-04-29 DIAGNOSIS — E1151 Type 2 diabetes mellitus with diabetic peripheral angiopathy without gangrene: Secondary | ICD-10-CM | POA: Diagnosis not present

## 2015-04-29 DIAGNOSIS — E119 Type 2 diabetes mellitus without complications: Secondary | ICD-10-CM | POA: Diagnosis not present

## 2015-04-29 NOTE — Patient Instructions (Signed)
Check blood sugars on waking up 2  times a week Also check blood sugars about 2 hours after a meal and do this after different meals by rotation  Recommended blood sugar levels on waking up is 90-130 and about 2 hours after meal is 130-160  Please bring your blood sugar monitor to each visit, thank you  Sweet tea only 2x per week  Eye exam

## 2015-04-29 NOTE — Progress Notes (Signed)
Patient ID: [name], male   DOB: [birth year], 75 y.o.   MRN: [id number]   Reason for Appointment : Followup of  Type 2 Diabetes  History of Present Illness          Diagnosis: Type 2 diabetes mellitus, date of diagnosis:   2001     Past history: He has had long-standing diabetes treated with various oral hypoglycemic drugs.  He has not been on metformin because of renal dysfunction He thinks he was given Actos for sometime but not clear if he was benefiting. This was stopped probably in 2012 but he does not think he had any side effects. He thinks he has been taking Tradjenta for a couple of years  His A1c has been monitored about once or twice a year as per records  Because of worsening A1c in 12/14 he was referred for further evaluation. Because of baseline A1c of 8.6% in 12/14 he was started on glipizide ER 5 mg and pioglitazone 30 mg daily in addition to his Tradjenta He was also started on home glucose monitoring with a One Touch monitor  Recent history:   He is taking a 3 drug regimen including Actos, Tradjenta and glipizide ER 5 mg.     His control is still somewhat  High with A1c 7.5% and  Has been about the same for some time  Previously fructosamine  Was 299    Current management, blood sugar patterns and problems identified:   he started checking his blood sugars only about 4 days ago he says he does not like needles    his blood sugars are fairly good with range 106-168 with highest reading after lunch    He still does not stopped drinking sweet tea which he still does fairly regularly although overall less than before   he is not very active except some walking around his neighborhood   His weight is stable  Glucose monitoring:   using a  One Touch ultra mini monitor , results as above   Hypoglycemia: None    Self-care: The diet that the patient has been following is: None, see above     Meals: 3 meals per day. Still has some sweet tea;  powerade and he  Does not like unsweetened drinks    Exercise:  is walking about 3x a week        Dietician visit:  1/16. CDE 5/15             Weight history:  Wt Readings from Last 3 Encounters:  04/29/15 155 lb 12.8 oz (70.67 kg)  12/28/14 157 lb (71.215 kg)  11/10/14 157 lb 6.4 oz (71.396 kg)   Glycemic control:   Lab Results  Component Value Date   HGBA1C 7.5* 04/26/2015   HGBA1C 7.4* 12/23/2014   HGBA1C 7.5* 09/22/2014   Lab Results  Component Value Date   MICROALBUR <0.7 09/22/2014   LDLCALC 81 12/23/2014   CREATININE 2.52* 04/26/2015          Medication List       This list is accurate as of: 04/29/15  9:42 AM.  Always use your most recent med list.               atenolol 100 MG tablet  Commonly known as:  TENORMIN  Take 100 mg by mouth daily. Take 1/2 tablet daily     atorvastatin 10 MG tablet  Commonly known as:  LIPITOR  TAKE 1/2 TABLET (5 MG  TOTAL) BY MOUTH DAILY.     furosemide 80 MG tablet  Commonly known as:  LASIX  Take 80 mg by mouth.     glipiZIDE 5 MG 24 hr tablet  Commonly known as:  GLUCOTROL XL  TAKE 1 TABLET BY MOUTH DAILY WITH BREAKFAST.     glucose blood test strip  Commonly known as:  ONE TOUCH ULTRA TEST  Test blood sugar once a day . Dx code: E11.9     KLOR-CON M20 20 MEQ tablet  Generic drug:  potassium chloride SA  TAKE 1 TABLET BY MOUTH DAILY     ONETOUCH DELICA LANCETS FINE Misc  Use to check blood sugar once a day     pioglitazone 30 MG tablet  Commonly known as:  ACTOS  Take 30 mg by mouth daily.     TRADJENTA 5 MG Tabs tablet  Generic drug:  linagliptin  TAKE 1 TABLET (5 MG TOTAL) BY MOUTH DAILY.        Allergies: No Known Allergies  Past Medical History  Diagnosis Date  . Hypertension   . Diabetes mellitus   . Hyperlipidemia   . Prostate cancer ([place])   . Renal insufficiency   . Cataract     Past Surgical History  Procedure Laterality Date  . Prostate surgery      radiation therapy only     Family History  Problem Relation Age of Onset  . Diabetes Father   . Diabetes Sister   . Hypertension Sister   . Hypertension Brother   . Heart disease Neg Hx     Social History:  reports that he has quit smoking. He does not have any smokeless tobacco history on file. His alcohol and drug histories are not on file.    Review of Systems       Lipids: Currently taking 10 mg Lipitor. 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 He has been told that he has a cataract since he was young                The blood pressure has been controlled with medications for several years and he is  taking atenolol 100 mg only.     Followed by nephrologist for his chronic kidney disease Also on Lasix     LABS:  Lab on 04/26/2015  Component Date Value Ref Range Status  . Hgb A1c MFr Bld 04/26/2015 7.5* 4.6 - 6.5 % Final   Glycemic Control Guidelines for People with Diabetes:Non Diabetic:  <6%Goal of Therapy: <7%Additional Action Suggested:  >8%   . Sodium 04/26/2015 141  135 - 145 mEq/L Final  . Potassium 04/26/2015 4.0  3.5 - 5.1 mEq/L Final  . Chloride 04/26/2015 106  96 - 112 mEq/L Final  . CO2 04/26/2015 25  19 - 32 mEq/L Final  . Glucose, Bld 04/26/2015 139* 70 - 99 mg/dL Final  . BUN 04/26/2015 18  6 - 23 mg/dL Final  . Creatinine, Ser 04/26/2015 2.52* 0.40 - 1.50 mg/dL Final  . Calcium 04/26/2015 8.8  8.4 - 10.5 mg/dL Final  . GFR 04/26/2015 32.32* >60.00 mL/min Final    Physical Examination:  BP 134/82 mmHg  Pulse 73  Temp(Src) 97.9 F (36.6 C)  Resp 16  Ht 5\' 8"  (1.727 m)  Wt 155 lb 12.8 oz (70.67 kg)  BMI 23.69 kg/m2  SpO2 98%  Diabetic Foot Exam - Simple   Simple Foot Form  Diabetic Foot exam was performed with the following findings:   Yes 04/29/2015  8:23 AM  Visual Inspection  No deformities, no ulcerations, no other skin breakdown bilaterally:  Yes  Sensation Testing  Intact to touch and monofilament testing bilaterally:  Yes  Pulse Check  See comments:  Yes  Comments  Absent pulses        ASSESSMENT/PLAN: 1. Diabetes type 2, nonobese  His A1c indicates fair control with the level  Stable around 7.5   Does not appear to have significant hyperglycemia but checking home readings very infrequently and not after meals much   He is currently on triple therapy Discussed trying to be more active and also been consistent with avoiding drinks which sugar  2. Renal failure with stable creatinine and electrolytes   3.  Hypertension:  Control has been good  4.  Asymptomatic PVD with absent pedal pulses   Patient Instructions  Check blood sugars on waking up 2  times a week Also check blood sugars about 2 hours after a meal and do this after different meals by rotation  Recommended blood sugar levels on waking up is 90-130 and about 2 hours after meal is 130-160  Please bring your blood sugar monitor to each visit, thank you  Sweet tea only 2x per week  Eye exam       [facility] 04/29/2015, 9:42 AM

## 2015-05-12 DIAGNOSIS — D649 Anemia, unspecified: Secondary | ICD-10-CM | POA: Diagnosis not present

## 2015-05-12 DIAGNOSIS — E119 Type 2 diabetes mellitus without complications: Secondary | ICD-10-CM | POA: Diagnosis not present

## 2015-05-12 DIAGNOSIS — N183 Chronic kidney disease, stage 3 (moderate): Secondary | ICD-10-CM | POA: Diagnosis not present

## 2015-05-12 DIAGNOSIS — I10 Essential (primary) hypertension: Secondary | ICD-10-CM | POA: Diagnosis not present

## 2015-05-13 DIAGNOSIS — D509 Iron deficiency anemia, unspecified: Secondary | ICD-10-CM | POA: Diagnosis not present

## 2015-05-19 ENCOUNTER — Other Ambulatory Visit (HOSPITAL_COMMUNITY): Payer: Self-pay | Admitting: *Deleted

## 2015-05-20 ENCOUNTER — Ambulatory Visit (HOSPITAL_COMMUNITY)
Admission: RE | Admit: 2015-05-20 | Discharge: 2015-05-20 | Disposition: A | Discharge status: Home / Self Care | Payer: Medicare Other | Source: Ambulatory Visit | Attending: Nephrology | Admitting: Nephrology

## 2015-05-20 DIAGNOSIS — D509 Iron deficiency anemia, unspecified: Secondary | ICD-10-CM | POA: Diagnosis not present

## 2015-05-20 MED ORDER — SODIUM CHLORIDE 0.9 % IV SOLN
510.0000 mg | INTRAVENOUS | Status: DC
  Administered 2015-05-20: 510 mg via INTRAVENOUS
  Filled 2015-05-20: qty 17

## 2015-05-20 NOTE — Discharge Instructions (Signed)

## 2015-05-27 ENCOUNTER — Ambulatory Visit (HOSPITAL_COMMUNITY)
Admission: RE | Admit: 2015-05-27 | Discharge: 2015-05-27 | Disposition: A | Discharge status: Home / Self Care | Payer: Medicare Other | Source: Ambulatory Visit | Attending: Nephrology | Admitting: Nephrology

## 2015-05-27 ENCOUNTER — Other Ambulatory Visit: Payer: Self-pay | Admitting: Endocrinology

## 2015-05-27 DIAGNOSIS — D509 Iron deficiency anemia, unspecified: Secondary | ICD-10-CM | POA: Insufficient documentation

## 2015-05-27 MED ORDER — SODIUM CHLORIDE 0.9 % IV SOLN
510.0000 mg | INTRAVENOUS | Status: AC
  Administered 2015-05-27: 510 mg via INTRAVENOUS
  Filled 2015-05-27: qty 17

## 2015-06-02 ENCOUNTER — Other Ambulatory Visit: Payer: Self-pay | Admitting: Endocrinology

## 2015-08-02 DIAGNOSIS — C61 Malignant neoplasm of prostate: Secondary | ICD-10-CM | POA: Diagnosis not present

## 2015-08-09 DIAGNOSIS — Z0000 Encounter for general adult medical examination without abnormal findings: Secondary | ICD-10-CM | POA: Diagnosis not present

## 2015-08-09 DIAGNOSIS — C61 Malignant neoplasm of prostate: Secondary | ICD-10-CM | POA: Diagnosis not present

## 2015-08-23 ENCOUNTER — Other Ambulatory Visit (INDEPENDENT_AMBULATORY_CARE_PROVIDER_SITE_OTHER): Payer: Medicare Other

## 2015-08-23 DIAGNOSIS — E119 Type 2 diabetes mellitus without complications: Secondary | ICD-10-CM | POA: Diagnosis not present

## 2015-08-23 LAB — COMPREHENSIVE METABOLIC PANEL
ALT: 8 U/L (ref 0–53)
AST: 15 U/L (ref 0–37)
Albumin: 3.6 g/dL (ref 3.5–5.2)
Alkaline Phosphatase: 77 U/L (ref 39–117)
BILIRUBIN TOTAL: 0.3 mg/dL (ref 0.2–1.2)
BUN: 19 mg/dL (ref 6–23)
CO2: 25 meq/L (ref 19–32)
CREATININE: 2.37 mg/dL — AB (ref 0.40–1.50)
Calcium: 8.8 mg/dL (ref 8.4–10.5)
Chloride: 109 mEq/L (ref 96–112)
GFR: 34.66 mL/min — ABNORMAL LOW (ref >60.00)
GLUCOSE: 168 mg/dL — AB (ref 70–99)
Potassium: 3.3 mEq/L — ABNORMAL LOW (ref 3.5–5.1)
Sodium: 144 mEq/L (ref 135–145)
Total Protein: 6.2 g/dL (ref 6.0–8.3)

## 2015-08-23 LAB — HEMOGLOBIN A1C: HEMOGLOBIN A1C: 7.4 % — AB (ref 4.6–6.5)

## 2015-08-23 LAB — LIPID PANEL
CHOL/HDL RATIO: 4
Cholesterol: 168 mg/dL (ref 0–200)
HDL: 40 mg/dL (ref >39.00)
LDL Cholesterol: 104 mg/dL — ABNORMAL HIGH (ref 0–99)
NONHDL: 128.06
Triglycerides: 121 mg/dL (ref 0.0–149.0)
VLDL: 24.2 mg/dL (ref 0.0–40.0)

## 2015-08-23 LAB — MICROALBUMIN / CREATININE URINE RATIO
CREATININE, U: 138.8 mg/dL
MICROALB/CREAT RATIO: 0.5 mg/g (ref 0.0–30.0)
Microalb, Ur: <0.7 mg/dL (ref 0.0–1.9)

## 2015-08-26 ENCOUNTER — Encounter: Payer: Self-pay | Admitting: Endocrinology

## 2015-08-26 ENCOUNTER — Ambulatory Visit (INDEPENDENT_AMBULATORY_CARE_PROVIDER_SITE_OTHER): Payer: Medicare Other | Admitting: Endocrinology

## 2015-08-26 VITALS — BP 122/60 | HR 70 | Ht 68.0 in | Wt 151.0 lb

## 2015-08-26 DIAGNOSIS — E1165 Type 2 diabetes mellitus with hyperglycemia: Secondary | ICD-10-CM

## 2015-08-26 DIAGNOSIS — N184 Chronic kidney disease, stage 4 (severe): Secondary | ICD-10-CM | POA: Diagnosis not present

## 2015-08-26 DIAGNOSIS — E876 Hypokalemia: Secondary | ICD-10-CM

## 2015-08-26 DIAGNOSIS — E785 Hyperlipidemia, unspecified: Secondary | ICD-10-CM | POA: Diagnosis not present

## 2015-08-26 DIAGNOSIS — E1151 Type 2 diabetes mellitus with diabetic peripheral angiopathy without gangrene: Secondary | ICD-10-CM

## 2015-08-26 MED ORDER — ATORVASTATIN CALCIUM 10 MG PO TABS: 10.0000 mg | ORAL_TABLET | Freq: Every day | ORAL | Status: DC

## 2015-08-26 NOTE — Progress Notes (Signed)
Patient ID: [name], male   DOB: May 27, 1940, 75 y.o.   MRN: [id number]   Reason for Appointment : Followup  History of Present Illness          Diagnosis: Type 2 diabetes mellitus, date of diagnosis:   2001     Past history: He has had long-standing diabetes treated with various oral hypoglycemic drugs.  He has not been on metformin because of renal dysfunction He thinks he was given Actos for sometime but not clear if he was benefiting. This was stopped probably in 2012 but he does not think he had any side effects. He thinks he has been taking Tradjenta for a couple of years  His A1c has been monitored about once or twice a year as per records  Because of worsening A1c in 12/14 he was referred for further evaluation. Because of baseline A1c of 8.6% in 12/14 he was started on glipizide ER 5 mg and pioglitazone 30 mg daily in addition to his Tradjenta He was also started on home glucose monitoring with a One Touch monitor  Recent history:   He is taking a 3 drug regimen of Actos, Tradjenta and glipizide ER 5 mg.    Again has a relatively high A1c of 7.4, previously 7.5%    Current management, blood sugar patterns and problems identified:   he has been checking his blood sugars only in the morning despite reminding him to check more readings in the afternoon and evenings.  He has no difficulty picking a finger now  Although his weight is slightly lower his diabetes still  suboptimal with relatively high fat content at times and drinking a lot of juice and sometimes sweet tea   his blood sugars are averaging 135 with range 108-162     he is somewhat with some walking around his neighborhood  Tolerating Actos without any side effects and no hypoglycemia with glipizide  Glucose monitoring:   using a  One Touch ultra mini monitor , results as above  Hypoglycemia: None    Self-care: The diet that the patient has been following is: None He is not restricting  high-fat foods like cheeses, red meat.  Also drinking at least 16 ounces of juice a day along with sweet tea at times  Meals: 3 meals per day. Still has some sweet tea; powerade and juice  Exercise:  is walking about 3-4 x a week        Dietician visit:  1/16. CDE 5/15             Weight history:  Wt Readings from Last 3 Encounters:  08/26/15 151 lb (68.493 kg)  05/20/15 155 lb (70.308 kg)  04/29/15 155 lb 12.8 oz (70.67 kg)   Glycemic control:   Lab Results  Component Value Date   HGBA1C 7.4* 08/23/2015   HGBA1C 7.5* 04/26/2015   HGBA1C 7.4* 12/23/2014   Lab Results  Component Value Date   MICROALBUR <0.7 08/23/2015   LDLCALC 104* 08/23/2015   CREATININE 2.37* 08/23/2015          Medication List       This list is accurate as of: 08/26/15  8:23 AM.  Always use your most recent med list.               atenolol 100 MG tablet  Commonly known as:  TENORMIN  Take 100 mg by mouth daily. Take 1/2 tablet daily     atorvastatin 10 MG tablet  Commonly known as:  LIPITOR  Take 1 tablet (10 mg total) by mouth daily after supper.     furosemide 80 MG tablet  Commonly known as:  LASIX  Take 80 mg by mouth.     glipiZIDE 5 MG 24 hr tablet  Commonly known as:  GLUCOTROL XL  TAKE 1 TABLET BY MOUTH DAILY WITH BREAKFAST.     glucose blood test strip  Commonly known as:  ONE TOUCH ULTRA TEST  Test blood sugar once a day . Dx code: E11.9     KLOR-CON M20 20 MEQ tablet  Generic drug:  potassium chloride SA  TAKE 1 TABLET BY MOUTH DAILY     ONETOUCH DELICA LANCETS FINE Misc  Use to check blood sugar once a day     pioglitazone 30 MG tablet  Commonly known as:  ACTOS  TAKE 1 TABLET BY MOUTH EVERY DAY     TRADJENTA 5 MG Tabs tablet  Generic drug:  linagliptin  TAKE 1 TABLET (5 MG TOTAL) BY MOUTH DAILY.        Allergies: No Known Allergies  Past Medical History  Diagnosis Date  . Hypertension   . Diabetes mellitus   . Hyperlipidemia   . Prostate cancer ([place])    . Renal insufficiency   . Cataract     Past Surgical History  Procedure Laterality Date  . Prostate surgery      radiation therapy only    Family History  Problem Relation Age of Onset  . Diabetes Father   . Diabetes Sister   . Hypertension Sister   . Hypertension Brother   . Heart disease Neg Hx     Social History:  reports that he has quit smoking. He does not have any smokeless tobacco history on file. His alcohol and drug histories are not on file.    Review of Systems       Lipids: Currently taking Only a half 10 mg Lipitor. 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 He has been told that he has a cataract since he was young                The blood pressure has been controlled with medications for several years and he is taking atenolol 100 mg only.     Followed by nephrologist Dr. Florene Glen for his chronic kidney disease Also on Lasix 80 mg daily but he has not had edema for quite some time. Also his potassium is low at 3.3 Renal function stable    LABS:  Lab on 08/23/2015  Component Date Value Ref Range Status  . Hgb A1c MFr Bld 08/23/2015 7.4* 4.6 - 6.5 % Final   Glycemic Control Guidelines for People with Diabetes:Non Diabetic:  <6%Goal of Therapy: <7%Additional Action Suggested:  >8%   . Sodium 08/23/2015 144  135 - 145 mEq/L Final  . Potassium 08/23/2015 3.3* 3.5 - 5.1 mEq/L Final  . Chloride 08/23/2015 109  96 - 112 mEq/L Final  . CO2 08/23/2015 25  19 - 32 mEq/L Final  .  Glucose, Bld 08/23/2015 168* 70 - 99 mg/dL Final  . BUN 08/23/2015 19  6 - 23 mg/dL Final  . Creatinine, Ser 08/23/2015 2.37* 0.40 - 1.50  mg/dL Final  . Total Bilirubin 08/23/2015 0.3  0.2 - 1.2 mg/dL Final  . Alkaline Phosphatase 08/23/2015 77  39 - 117 U/L Final  . AST 08/23/2015 15  0 - 37 U/L Final  . ALT 08/23/2015 8  0 - 53 U/L Final  . Total Protein 08/23/2015 6.2  6.0 - 8.3 g/dL Final  . Albumin 08/23/2015 3.6  3.5 - 5.2 g/dL Final  . Calcium 08/23/2015 8.8  8.4 - 10.5 mg/dL Final  . GFR 08/23/2015 34.66* >60.00 mL/min Final  . Cholesterol 08/23/2015 168  0 - 200 mg/dL Final   ATP III Classification       Desirable:  < 200 mg/dL               Borderline High:  200 - 239 mg/dL          High:  > = 240 mg/dL  . Triglycerides 08/23/2015 121.0  0.0 - 149.0 mg/dL Final   Normal:  <150 mg/dLBorderline High:  150 - 199 mg/dL  . HDL 08/23/2015 40.00  >39.00 mg/dL Final  . VLDL 08/23/2015 24.2  0.0 - 40.0 mg/dL Final  . LDL Cholesterol 08/23/2015 104* 0 - 99 mg/dL Final  . Total CHOL/HDL Ratio 08/23/2015 4   Final                  Men          Women1/2 Average Risk     3.4          3.3Average Risk          5.0          4.42X Average Risk          9.6          7.13X Average Risk          15.0          11.0                      . NonHDL 08/23/2015 128.06   Final   NOTE:  Non-HDL goal should be 30 mg/dL higher than patient's LDL goal (i.e. LDL goal of < 70 mg/dL, would have non-HDL goal of < 100 mg/dL)  . Microalb, Ur 08/23/2015 <0.7  0.0 - 1.9 mg/dL Final  . Creatinine,U 08/23/2015 138.8   Final  . Microalb Creat Ratio 08/23/2015 0.5  0.0 - 30.0 mg/g Final    Physical Examination:  BP 122/60 mmHg  Pulse 70  Ht 5\' 8"  (1.727 m)  Wt 151 lb (68.493 kg)  BMI 22.96 kg/m2  SpO2 97%    ASSESSMENT/PLAN: 1. Diabetes type 2, nonobese  His A1c indicates fair control with the level  Stable around 7.4 but could be better He is checking blood sugars fasting only and these are variable Most likely has blood sugars fluctuate based on his diet with relatively high fat content at times and drinks with sugar including juice He is  trying to be a little more active and weight is slightly better For now he will try to improve her diet as discussed in detail today but also start monitoring but sugars after meals more regularly No side effects with 30 mg Actos  2. Renal failure with stable creatinine  HYPOKALEMIA: Since he has not had any  cardiac history and no edema for quite some time he can try taking a half tablet Lasix until seen by his nephrologist  3.  Hypertension:  Control has been good with atenolol  4.  Hypercholesterolemia: He is only on 5 mg Lipitor and will need to increase this to 10 mg.  Given him list of low saturated fat foods.  Discussed prevention of cardiovascular disease.  He already has some asymptomatic PVD with absent pedal pulses   Patient Instructions  Check blood sugars on waking up 3  times a week  Also check blood sugars about 2 hours after a meal and do this after different meals by rotation  Recommended blood sugar levels on waking up is 90-130 and about 2 hours after meal is 130-160  Please bring your blood sugar monitor to each visit, thank you  Reduce Juice to 4 oz daily and no sweet tea  Reduce furosemide to 1/2  Need copy of eye exam  Atorvastatin 1 tab daily   Counseling time on subjects discussed above is over 50% of today's 25 minute visit   [name] 08/26/2015, 8:23 AM   Note: This office note was prepared with Estate agent. Any transcriptional errors that result from this process are unintentional.

## 2015-08-26 NOTE — Patient Instructions (Addendum)
Check blood sugars on waking up 3  times a week  Also check blood sugars about 2 hours after a meal and do this after different meals by rotation  Recommended blood sugar levels on waking up is 90-130 and about 2 hours after meal is 130-160  Please bring your blood sugar monitor to each visit, thank you  Reduce Juice to 4 oz daily and no sweet tea  Reduce furosemide to 1/2  Need copy of eye exam  Atorvastatin 1 tab daily

## 2015-08-28 ENCOUNTER — Other Ambulatory Visit: Payer: Self-pay | Admitting: Endocrinology

## 2015-08-30 DIAGNOSIS — E119 Type 2 diabetes mellitus without complications: Secondary | ICD-10-CM | POA: Diagnosis not present

## 2015-08-30 LAB — HM DIABETES EYE EXAM

## 2015-08-31 ENCOUNTER — Encounter: Payer: Self-pay | Admitting: *Deleted

## 2015-09-01 ENCOUNTER — Other Ambulatory Visit: Payer: Self-pay | Admitting: Family Medicine

## 2015-09-01 MED ORDER — POTASSIUM CHLORIDE CRYS ER 20 MEQ PO TBCR: 20.0000 meq | EXTENDED_RELEASE_TABLET | Freq: Every day | ORAL | Status: DC

## 2015-10-10 ENCOUNTER — Other Ambulatory Visit: Payer: Self-pay

## 2015-10-10 MED ORDER — ATORVASTATIN CALCIUM 10 MG PO TABS: 10.0000 mg | ORAL_TABLET | Freq: Every day | ORAL | Status: DC

## 2015-10-20 DIAGNOSIS — C61 Malignant neoplasm of prostate: Secondary | ICD-10-CM | POA: Diagnosis not present

## 2015-10-27 ENCOUNTER — Other Ambulatory Visit (HOSPITAL_COMMUNITY): Payer: Self-pay | Admitting: Urology

## 2015-10-27 DIAGNOSIS — C61 Malignant neoplasm of prostate: Secondary | ICD-10-CM

## 2015-11-07 ENCOUNTER — Encounter (HOSPITAL_COMMUNITY)
Admission: RE | Admit: 2015-11-07 | Discharge: 2015-11-07 | Disposition: A | Discharge status: Home / Self Care | Payer: Medicare Other | Source: Ambulatory Visit | Attending: Urology | Admitting: Urology

## 2015-11-07 ENCOUNTER — Ambulatory Visit (HOSPITAL_COMMUNITY)
Admission: RE | Admit: 2015-11-07 | Discharge: 2015-11-07 | Disposition: A | Discharge status: Home / Self Care | Payer: Medicare Other | Source: Ambulatory Visit | Attending: Urology | Admitting: Urology

## 2015-11-07 DIAGNOSIS — R937 Abnormal findings on diagnostic imaging of other parts of musculoskeletal system: Secondary | ICD-10-CM | POA: Diagnosis not present

## 2015-11-07 DIAGNOSIS — C61 Malignant neoplasm of prostate: Secondary | ICD-10-CM | POA: Diagnosis not present

## 2015-11-07 MED ORDER — TECHNETIUM TC 99M MEDRONATE IV KIT
25.2000 | PACK | Freq: Once | INTRAVENOUS | Status: AC | PRN
  Administered 2015-11-07: 25.2 via INTRAVENOUS

## 2015-11-09 DIAGNOSIS — E291 Testicular hypofunction: Secondary | ICD-10-CM | POA: Diagnosis not present

## 2015-11-09 DIAGNOSIS — C61 Malignant neoplasm of prostate: Secondary | ICD-10-CM | POA: Diagnosis not present

## 2015-11-14 ENCOUNTER — Other Ambulatory Visit (HOSPITAL_COMMUNITY): Payer: Self-pay | Admitting: Urology

## 2015-11-14 DIAGNOSIS — R9721 Rising PSA following treatment for malignant neoplasm of prostate: Secondary | ICD-10-CM

## 2015-11-17 ENCOUNTER — Encounter (HOSPITAL_COMMUNITY)
Admission: RE | Admit: 2015-11-17 | Discharge: 2015-11-17 | Disposition: A | Discharge status: Home / Self Care | Payer: Medicare Other | Source: Ambulatory Visit | Attending: Urology | Admitting: Urology

## 2015-11-17 DIAGNOSIS — C61 Malignant neoplasm of prostate: Secondary | ICD-10-CM | POA: Diagnosis not present

## 2015-11-17 DIAGNOSIS — R9721 Rising PSA following treatment for malignant neoplasm of prostate: Secondary | ICD-10-CM | POA: Diagnosis not present

## 2015-11-24 ENCOUNTER — Other Ambulatory Visit: Payer: Self-pay | Admitting: Endocrinology

## 2015-11-25 ENCOUNTER — Ambulatory Visit: Payer: Medicare Other | Admitting: Endocrinology

## 2015-11-26 ENCOUNTER — Other Ambulatory Visit: Payer: Self-pay | Admitting: Endocrinology

## 2015-11-29 ENCOUNTER — Telehealth: Payer: Self-pay | Admitting: *Deleted

## 2015-11-29 NOTE — Telephone Encounter (Signed)
Telephone call to patient regarding new patient referral. Pt is available to come in 9/6 at 11am. Prior commitment on 9/1.   Pt is aware of directions, date/time of appt.  Records in media tab.

## 2015-12-09 ENCOUNTER — Ambulatory Visit: Payer: Medicare Other | Admitting: Oncology

## 2015-12-14 ENCOUNTER — Encounter: Payer: Self-pay | Admitting: Pharmacist

## 2015-12-14 ENCOUNTER — Telehealth: Payer: Self-pay | Admitting: Oncology

## 2015-12-14 ENCOUNTER — Other Ambulatory Visit: Payer: Self-pay

## 2015-12-14 ENCOUNTER — Ambulatory Visit (HOSPITAL_BASED_OUTPATIENT_CLINIC_OR_DEPARTMENT_OTHER): Payer: Medicare Other | Admitting: Oncology

## 2015-12-14 VITALS — BP 142/80 | HR 71 | Temp 98.8°F | Resp 20 | Wt 145.3 lb

## 2015-12-14 DIAGNOSIS — C61 Malignant neoplasm of prostate: Secondary | ICD-10-CM | POA: Diagnosis not present

## 2015-12-14 DIAGNOSIS — E291 Testicular hypofunction: Secondary | ICD-10-CM

## 2015-12-14 DIAGNOSIS — Z7901 Long term (current) use of anticoagulants: Secondary | ICD-10-CM | POA: Diagnosis not present

## 2015-12-14 DIAGNOSIS — N289 Disorder of kidney and ureter, unspecified: Secondary | ICD-10-CM

## 2015-12-14 DIAGNOSIS — E119 Type 2 diabetes mellitus without complications: Secondary | ICD-10-CM

## 2015-12-14 DIAGNOSIS — Z86718 Personal history of other venous thrombosis and embolism: Secondary | ICD-10-CM | POA: Diagnosis not present

## 2015-12-14 DIAGNOSIS — Z8546 Personal history of malignant neoplasm of prostate: Secondary | ICD-10-CM

## 2015-12-14 MED ORDER — ABIRATERONE ACETATE 250 MG PO TABS: 1000.0000 mg | ORAL_TABLET | Freq: Every day | ORAL | 0 refills | Status: DC

## 2015-12-14 MED ORDER — PREDNISONE 5 MG PO TABS: 5.0000 mg | ORAL_TABLET | Freq: Every day | ORAL | 3 refills | Status: DC

## 2015-12-14 NOTE — Progress Notes (Unsigned)
Zytiga RX status  Patients insurance requires a pre-authorization for his Zytiga.  Pre-Auth was started with [employer] insurance thru Cover My Meds.  His Architect is [facility] and will have a response back to [place] within 24 hours.  We will monitor the cover my med site to check on status.  The RX must then be filled by a specialty pharmacy, RX sent to [facility] will await to see if local pharmacy can fill.  Thank you  Henreitta Leber, PharmD [facility]

## 2015-12-14 NOTE — Progress Notes (Signed)
Oral Chemotherapy Pharmacist Encounter  New prescription for [name] has been sent to the [facility] outpatient pharmacy for benefit analysis and approval.   Will follow up with patient regarding insurance and pharmacy.  Once pt starts Zytiga, we will follow up in 1-2 weeks for adherence and toxicity management.   Thank you, Kennith Center, Pharm.D., CPP 12/14/2015@2 :10 PM [facility]

## 2015-12-14 NOTE — Progress Notes (Signed)
Reason for Referral: Prostate cancer.   HPI: Mr. [name] is a 75 year old gentleman currently of [place] where he lived for the last 40-50 years. He is a gentleman with history of hypertension, diabetes and history of prostate cancer that dates back to 2006. He was diagnosed at that time was stage TIc disease and a Gleason score 4+4 = 8. His PSA at that time was unknown. He was treated with cryotherapy and a PSA nadir was around 0.4 in June 2017. His PSA started to rise and a repeat biopsy in 2008 showed no active cancer detected. His staging workup did not reveal any evidence of metastatic disease at that time including CT scan and bone scan. His PSA continues to rise and was a 7.75 in June 2009. He was treated with androgen deprivation under the care of Dr. Gaynelle Arabian initially with Degarelix and subsequently with Vantas. His PSA in 2011 was as well as 0.18. His PSA started to rise again in 2016 up to 19.57 in April 2016. In April 2017 was 31.55 and on 10/20/2015 was 43.6. Staging workup including CT scan and a bone scan did not reveal any clear-cut metastatic disease.   Imaging obtained on 11/17/2015 utilizing F-18 Fluciclovine PET was interpreted by Dr. Leonia Reeves from radiology and showed 2 soft tissue masses in the deep pelvis adjacent to the rectum with intense amino acid radiotracer uptake consistent with prostate cancer recurrence. These masses measuring 2.8 x 2.5 cm. Based on these findings patient referred to me for evaluation.  Clinically, he is asymptomatic. He denied any abdominal pain or change in his bowel habits. He denied any hematochezia or melena. He continues to live independently with his wife and attends to activities of daily living. He denied any bone pain but does report some weight loss. His appetite however is excellent.  He does not report any headaches, blurry vision, syncope or seizures. He does not report any fevers or chills or sweats. He does not report any cough, wheezing  or hemoptysis. He does not report any nausea, vomiting, abdominal pain, hematochezia or melena. He does not report any frequency urgency or hesitancy. He does not report any hematuria or dysuria. He does not report any skeletal complaints. Remaining review of systems unremarkable.   Past Medical History:  Diagnosis Date  . Cataract   . Diabetes mellitus   . Hyperlipidemia   . Hypertension   . Prostate cancer ([place])   . Renal insufficiency   :  Past Surgical History:  Procedure Laterality Date  . PROSTATE SURGERY     radiation therapy only  :   Current Outpatient Prescriptions:  .  abiraterone Acetate (ZYTIGA) 250 MG tablet, Take 4 tablets (1,000 mg total) by mouth daily. Take on an empty stomach 1 hour before or 2 hours after a meal, Disp: 120 tablet, Rfl: 0 .  atenolol (TENORMIN) 100 MG tablet, Take 100 mg by mouth daily. Take 1/2 tablet daily, Disp: , Rfl:  .  atorvastatin (LIPITOR) 10 MG tablet, Take 1 tablet (10 mg total) by mouth daily after supper., Disp: 90 tablet, Rfl: 0 .  furosemide (LASIX) 80 MG tablet, Take 80 mg by mouth., Disp: , Rfl:  .  GLIPIZIDE XL 5 MG 24 hr tablet, TAKE 1 TABLET BY MOUTH DAILY WITH BREAKFAST., Disp: 90 tablet, Rfl: 0 .  ONE TOUCH ULTRA TEST test strip, USE TO TEST ONCE DAILY, Disp: 50 each, Rfl: 3 .  ONETOUCH DELICA LANCETS 99991111 MISC, USE TO TEST ONCE DAILY, Disp: 100 each,  Rfl: 0 .  pioglitazone (ACTOS) 30 MG tablet, TAKE 1 TABLET BY MOUTH EVERY DAY, Disp: 90 tablet, Rfl: 0 .  potassium chloride SA (KLOR-CON M20) 20 MEQ tablet, Take 1 tablet (20 mEq total) by mouth daily., Disp: 90 tablet, Rfl: 1 .  predniSONE (DELTASONE) 5 MG tablet, Take 1 tablet (5 mg total) by mouth daily with breakfast., Disp: 30 tablet, Rfl: 3 .  TRADJENTA 5 MG TABS tablet, TAKE 1 TABLET (5 MG TOTAL) BY MOUTH DAILY., Disp: 90 tablet, Rfl: 0:  No Known Allergies:  Family History  Problem Relation Age of Onset  . Diabetes Father   . Diabetes Sister   . Hypertension Sister    . Hypertension Brother   . Heart disease Neg Hx   :  Social History   Social History  . Marital status: Married    Spouse name: N/A  . Number of children: N/A  . Years of education: N/A   Occupational History  . Not on file.   Social History Main Topics  . Smoking status: Former Research scientist (life sciences)  . Smokeless tobacco: Not on file  . Alcohol use Not on file  . Drug use: Unknown  . Sexual activity: Not on file   Other Topics Concern  . Not on file   Social History Narrative  . No narrative on file  :  Pertinent items are noted in HPI.  Exam: Blood pressure (!) 142/80, pulse 71, temperature 98.8 F (37.1 C), temperature source Oral, resp. rate 20, weight 145 lb 4.8 oz (65.9 kg), SpO2 100 %. General appearance: alert and cooperative Head: Normocephalic, without obvious abnormality Throat: lips, mucosa, and tongue normal; teeth and gums normal Neck: no adenopathy Back: negative Resp: clear to auscultation bilaterally Chest wall: no tenderness Cardio: regular rate and rhythm, S1, S2 normal, no murmur, click, rub or gallop GI: soft, non-tender; bowel sounds normal; no masses,  no organomegaly Extremities: extremities normal, atraumatic, no cyanosis or edema Pulses: 2+ and symmetric  CBC    Component Value Date/Time   WBC 7.5 03/13/2013 0902   RBC 3.62 (L) 03/13/2013 0902   HGB 10.4 (L) 03/13/2013 0902   HCT 31.9 (L) 03/13/2013 0902   PLT 226.0 03/13/2013 0902   MCV 88.1 03/13/2013 0902   MCH 28.3 08/22/2010 0520   MCHC 32.7 03/13/2013 0902   RDW 15.0 (H) 03/13/2013 0902   LYMPHSABS 1.4 04/30/2011 0851   MONOABS 0.4 04/30/2011 0851   EOSABS 0.2 04/30/2011 0851   BASOSABS 0.1 04/30/2011 0851      Chemistry      Component Value Date/Time   NA 144 08/23/2015 0756   K 3.3 (L) 08/23/2015 0756   CL 109 08/23/2015 0756   CO2 25 08/23/2015 0756   BUN 19 08/23/2015 0756   CREATININE 2.37 (H) 08/23/2015 0756      Component Value Date/Time   CALCIUM 8.8 08/23/2015 0756    ALKPHOS 77 08/23/2015 0756   AST 15 08/23/2015 0756   ALT 8 08/23/2015 0756   BILITOT 0.3 08/23/2015 0756       Nm Pet Image Initial (pi) Skull Base To Thigh  Result Date: 11/21/2015 CLINICAL DATA:  Subsequent treatment strategy for castrate resistant prostate cancer. Elevated PSA. EXAM: NUCLEAR MEDICINE PET SKULL BASE TO THIGH TECHNIQUE: 8.7 mCi F-18 Fluciclovine was injected intravenously. Full-ring PET imaging was performed from the skull base to thigh after the radiotracer. CT data was obtained and used for attenuation correction and anatomic localization. FASTING BLOOD GLUCOSE:  Value: Not  applicable COMPARISON:  CT 11/07/2015, bone scan 11/07/2015 FINDINGS: NECK No abnormal uptake to suggest metastatic disease. CHEST No nausea cavitary lesion in the RIGHT lower lobe measuring 1.9 cm (image 74, series 4 has mild radiotracer accumulation of similar to background blood pool activity ABDOMEN/PELVIS Within the pelvis, the two soft tissue masses anterior adjacent to the rectum measures 2.8 by 2.5 cm (image 169, series 4) and and 2.9 by 2.6 cm. These soft tissue masses have intense radiotracer accumulation with SUV max equal 10.9. The more inferior mass is anterior to the sacrum (image 172, series 4). No radiotracer accumulation within inguinal lymph or periaortic lymph nodes. No abnormal radiotracer accumulation within the liver. No abnormal metabolic activity liver. Large hiatal hernia. SKELETON No focal accumulation of radiotracer within the skeleton. Particular attention directed to the LEFT ribs. IMPRESSION: 1. Two soft tissue masses in the deep pelvis adjacent to the rectum with intense amino acid radiotracer (fluciclovine) accumulation are consistent with PROSTATE CARCINOMA RECURRENCE. 2. No evidence of metastatic adenopathy or distant disease. 3. Cavitary lesion in the RIGHT lower lobe is favored infectious or inflammatory. As no comparison imaging is available, recommend follow-up CT thorax in 3  months to evaluate for stability. Electronically Signed   By: Suzy Bouchard M.D.   On: 11/21/2015 08:41    Assessment and Plan:   75 year old gentleman with the following issues:  1. 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 His initial diagnosis was 2006 had a stage TIc and a Gleason score 4+4 = 8. He is status post cryotherapy in a fairly and subsequently treated with androgen deprivation and currently on Vantas. His PSA has started to rise in the last year and most recently was up to 43.6 on July 2017.  The natural course of castration resistant metastatic prostate cancer was discussed with the patient today. Treatment options were reviewed which include second line hormonal therapy, Provenge immunotherapy, and systemic chemotherapy. After discussion today, he has severe needle phobia and before oral medication if it's all possible. I believe given his small volume disease and would be reasonable to consider second line hormone therapy with Zytiga as a first option.  Risks and benefits of this medication was reviewed. Complications include hypertension, hypokalemia, lower extremity edema as well as complications associated with prednisone were reviewed. He is diabetic but willing to try 5 mg of prednisone and we'll monitor his blood sugar carefully. If he develops hypoglycemia, prednisone will be discontinued. The goal of therapy remains palliative but his disease control can be extended to months and possibly years.  After discussion today, he is agreeable to proceed with this therapy. He will start Zytiga 1000 mg daily with prednisone 5 mg daily.  2. Androgen deprivation: He will continue that under the care of Dr. Gaynelle Arabian. He has currently Vantas implant.  3. Bone directed therapy: He has no evidence of bony metastasis at this time.  4. Diabetes: Under the care of Dr. Dwyane Dee. His diabetic regimen might  need to be adjusted if his cancer treatment effect his blood sugar. Prednisone can be discontinued easily in the future of this becomes a problem.  5. 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  6. Renal insufficiency: His kidney function and electrolytes date to be monitored periodically on Zytiga.  7. Follow-up: Will be in one month to follow-up on his tolerance to this medication.

## 2015-12-14 NOTE — Telephone Encounter (Signed)
Gave patient avs report and appointments for October  °

## 2015-12-14 NOTE — Progress Notes (Signed)
Note Drug Interactions Detected w/ Lexicomp Drug Interactions:  Zytiga + Actos (pioglitazone)--> Zytiga may increase serum conc of Actos.  Actos is CYP2C8 substrate.  The AUC of pioglitazone was increased by avg of 46% in a study of healthy subjects given a single dose of abiraterone 1000 mg together w/ pioglitazone. Risk Rating: level C- advise to monitor therapy.  Zytiga + glipizide--> Fabio Asa is a moderate CYP2C9 inhibitor.  Glipizide is a CYP2C9 substrate.  Monitor for increased effects of glipizide.  Note pt starting on Prednisone also as concurrent tx w/ Zytiga.  This may cause elevated blood glucose.  When we provide initial counseling for Zytiga, we'll need to make sure pt is monitoring his blood glucose regularly at home.  Kennith Center, Pharm.D., CPP 12/14/2015@2 :19 PM [facility]

## 2015-12-16 ENCOUNTER — Telehealth: Payer: Self-pay | Admitting: Pharmacist

## 2015-12-16 NOTE — Telephone Encounter (Signed)
Received fax communication from [place] that PA has been approved for Zytiga.  Rx faxed to [facility] [phone] as well as copy of insurance cards.  Fax confirmation received.   [facility] phone:  [phone]  Raul Del, PharmD, [place], [facility] [phone]

## 2015-12-21 ENCOUNTER — Other Ambulatory Visit (INDEPENDENT_AMBULATORY_CARE_PROVIDER_SITE_OTHER): Payer: Medicare Other

## 2015-12-21 DIAGNOSIS — E1165 Type 2 diabetes mellitus with hyperglycemia: Secondary | ICD-10-CM | POA: Diagnosis not present

## 2015-12-21 LAB — BASIC METABOLIC PANEL
BUN: 14 mg/dL (ref 6–23)
CO2: 24 mEq/L (ref 19–32)
Calcium: 8.6 mg/dL (ref 8.4–10.5)
Chloride: 107 mEq/L (ref 96–112)
Creatinine, Ser: 2.36 mg/dL — ABNORMAL HIGH (ref 0.40–1.50)
GFR: 34.8 mL/min — AB (ref >60.00)
Glucose, Bld: 135 mg/dL — ABNORMAL HIGH (ref 70–99)
POTASSIUM: 4.1 meq/L (ref 3.5–5.1)
SODIUM: 142 meq/L (ref 135–145)

## 2015-12-21 LAB — LIPID PANEL
CHOLESTEROL: 147 mg/dL (ref 0–200)
HDL: 47.6 mg/dL (ref >39.00)
LDL Cholesterol: 75 mg/dL (ref 0–99)
NonHDL: 99.41
TRIGLYCERIDES: 122 mg/dL (ref 0.0–149.0)
Total CHOL/HDL Ratio: 3
VLDL: 24.4 mg/dL (ref 0.0–40.0)

## 2015-12-21 LAB — HEMOGLOBIN A1C: Hgb A1c MFr Bld: 7.1 % — ABNORMAL HIGH (ref 4.6–6.5)

## 2015-12-22 ENCOUNTER — Other Ambulatory Visit: Payer: Medicare Other

## 2015-12-23 NOTE — Progress Notes (Signed)
Zytiga RX update  Medication has been approved.  Shipped to patient: 12/23/15 with anticipated delivery of 12/26/15.  Will follow-up with patient on 12/27/15 to ensure receipt and counseling.  Thank You  Henreitta Leber, PharmD

## 2015-12-26 ENCOUNTER — Ambulatory Visit: Payer: Medicare Other | Admitting: Endocrinology

## 2015-12-28 DIAGNOSIS — R634 Abnormal weight loss: Secondary | ICD-10-CM | POA: Diagnosis not present

## 2015-12-28 DIAGNOSIS — E119 Type 2 diabetes mellitus without complications: Secondary | ICD-10-CM | POA: Diagnosis not present

## 2015-12-28 DIAGNOSIS — N183 Chronic kidney disease, stage 3 (moderate): Secondary | ICD-10-CM | POA: Diagnosis not present

## 2015-12-28 DIAGNOSIS — C61 Malignant neoplasm of prostate: Secondary | ICD-10-CM | POA: Diagnosis not present

## 2015-12-28 DIAGNOSIS — I10 Essential (primary) hypertension: Secondary | ICD-10-CM | POA: Diagnosis not present

## 2015-12-29 ENCOUNTER — Telehealth: Payer: Self-pay | Admitting: Pharmacist

## 2015-12-29 NOTE — Telephone Encounter (Signed)
Oral Chemotherapy Pharmacist Encounter   I spoke with patient for overview of new oral chemotherapy medication: Zytiga. Pt is doing well.  Start date: 12/27/15  Counseled patient on administration, dosing, side effects, safe handling, and monitoring. Pt stated he is taking his Zytiga at 6am and then eating breakfast at 7:30am and taking his prednisone with breakfast.   Side effects include but not limited to: hyperglycemia, hypertension, LE edema, cough, dyspnea, and diarrhea. Pt stated his blood pressure is unchanged and blood sugars remain under control.   [name] voiced understanding and appreciation.   All questions answered. Next MD appt for 10/4 confirmed with patient  Will follow up in 1-2 weeks for adherence and toxicity management. Patient expressed appreciation for the call.  Thank you,  Johny Drilling, PharmD, BCPS 12/29/2015  5:13 PM [facility] [phone]

## 2016-01-02 ENCOUNTER — Ambulatory Visit (INDEPENDENT_AMBULATORY_CARE_PROVIDER_SITE_OTHER): Payer: Medicare Other | Admitting: Endocrinology

## 2016-01-02 ENCOUNTER — Encounter: Payer: Self-pay | Admitting: Endocrinology

## 2016-01-02 ENCOUNTER — Other Ambulatory Visit: Payer: Self-pay | Admitting: Endocrinology

## 2016-01-02 VITALS — BP 122/68 | HR 72 | Temp 97.9°F | Resp 14 | Ht 68.0 in | Wt 139.8 lb

## 2016-01-02 DIAGNOSIS — Z23 Encounter for immunization: Secondary | ICD-10-CM

## 2016-01-02 DIAGNOSIS — E1165 Type 2 diabetes mellitus with hyperglycemia: Secondary | ICD-10-CM

## 2016-01-02 NOTE — Patient Instructions (Addendum)
Check blood sugars on waking up  3x per week  Also check blood sugars about 2 hours after a meal and do this after different meals by rotation  Recommended blood sugar levels on waking up is 90-130 and about 2 hours after meal is 130-160  Please bring your blood sugar monitor to each visit, thank you  Less cheese, sweet tea  Walk daily

## 2016-01-02 NOTE — Progress Notes (Signed)
Patient ID: [name], male   DOB: 1941-03-28, 75 y.o.   MRN: [id number]   Reason for Appointment : Followup  History of Present Illness          Diagnosis: Type 2 diabetes mellitus, date of diagnosis:   2001     Past history: He has had long-standing diabetes treated with various oral hypoglycemic drugs.  He has not been on metformin because of renal dysfunction He thinks he was given Actos for sometime but not clear if he was benefiting. This was stopped probably in 2012 but he does not think he had any side effects. He thinks he has been taking Tradjenta for a couple of years  His A1c has been monitored about once or twice a year as per records  Because of worsening A1c in 12/14 he was referred for further evaluation. Because of baseline A1c of 8.6% in 12/14 he was started on glipizide ER 5 mg and pioglitazone 30 mg daily in addition to his Tradjenta He was also started on home glucose monitoring with a One Touch monitor  Recent history:   He is taking a 3 drug regimen of Actos, Tradjenta and glipizide ER 5 mg.    His A1c is relatively better at 7.1, previously 7.4    Current management, blood sugar patterns and problems identified:   he has been checking his blood sugars only in the morning and again despite reminding him to check more readings after meals  Not clear why he continues to lose some weight   He still may occasionally have higher fat meals at times and drinking some juice, Gatorade and sometimes sweet tea  Recently has not been doing much walking  Tolerating Actos without any side effects and no hypoglycemia with glipizide  Glucose monitoring:   using a  One Touch ultra mini monitor   FASTING glucose range 111-178 with MEDIAN 129  Hypoglycemia: None    Self-care: The diet that the patient has been following is: None He is not restricting high-fat foods like cheeses.  Also drinking 8 ounces of juice a day along with sweet tea at times  Meals: 3 meals per day. Still has some sweet tea and juice  Exercise:  is walking about 0-1x a week        Dietician visit:  1/16. CDE 5/15             Weight history:  Wt Readings from Last 3 Encounters:  01/02/16 139 lb 12.8 oz (63.4 kg)  12/14/15 145 lb 4.8 oz (65.9 kg)  08/26/15 151 lb (68.5 kg)   Glycemic control:   Lab Results  Component Value Date   HGBA1C 7.1 (H) 12/21/2015   HGBA1C 7.4 (H) 08/23/2015   HGBA1C 7.5 (H) 04/26/2015   Lab Results  Component Value Date   MICROALBUR <0.7 08/23/2015   LDLCALC 75 12/21/2015   CREATININE 2.36 (H) 12/21/2015          Medication List       Accurate as of 01/02/16  1:03 PM. Always use your most recent med list.          abiraterone Acetate 250 MG tablet Commonly known as:  ZYTIGA Take 4 tablets (1,000 mg total) by mouth daily. Take on an empty stomach 1 hour before or 2 hours after a meal   atenolol 100 MG tablet Commonly known as:  TENORMIN Take 100 mg by mouth daily. Take 1/2 tablet daily   atorvastatin 10 MG tablet  Commonly known as:  LIPITOR Take 1 tablet (10 mg total) by mouth daily after supper.   furosemide 80 MG tablet Commonly known as:  LASIX Take 80 mg by mouth.   GLIPIZIDE XL 5 MG 24 hr tablet Generic drug:  glipiZIDE TAKE 1 TABLET BY MOUTH DAILY WITH BREAKFAST.   ONE TOUCH ULTRA TEST test strip Generic drug:  glucose blood USE TO TEST ONCE DAILY   ONETOUCH DELICA LANCETS 99991111 Misc USE TO TEST ONCE DAILY   pioglitazone 30 MG tablet Commonly known as:  ACTOS TAKE 1 TABLET BY MOUTH EVERY DAY   potassium chloride SA 20 MEQ tablet Commonly known as:  KLOR-CON M20 Take 1 tablet (20 mEq total) by mouth daily.   predniSONE 5 MG tablet Commonly known as:  DELTASONE Take 1 tablet (5 mg total) by mouth daily with breakfast.   TRADJENTA 5 MG Tabs tablet Generic drug:  linagliptin TAKE 1 TABLET (5 MG TOTAL) BY MOUTH DAILY.       Allergies: No Known Allergies  Past Medical History:    Diagnosis Date  . Cataract   . Diabetes mellitus   . Hyperlipidemia   . Hypertension   . Prostate cancer ([place])   . Renal insufficiency     Past Surgical History:  Procedure Laterality Date  . PROSTATE SURGERY     radiation therapy only    Family History  Problem Relation Age of Onset  . Diabetes Father   . Diabetes Sister   . Hypertension Sister   . Hypertension Brother   . Heart disease Neg Hx     Social History:  reports that he has quit smoking. He does not have any smokeless tobacco history on file. His alcohol and drug histories are not on file.    Review of Systems       Lipids: Currently taking  10 mg Lipitor. 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 He has been told that he has a cataract since he was young                The blood pressure has been controlled with medications for several years and he is taking atenolol 100 mg only.     Followed by nephrologist Dr. Florene Glen for his chronic kidney disease Also on Lasix 80 mg daily Renal function stable    LABS:  No visits with results within 1 Week(s) from this visit.  Latest known visit with results is:  Lab on 12/21/2015  Component Date Value Ref Range Status  . Hgb A1c MFr Bld 12/21/2015 7.1* 4.6 - 6.5 % Final  . Sodium 12/21/2015 142  135 - 145 mEq/L Final  . Potassium 12/21/2015 4.1  3.5 - 5.1 mEq/L Final  . Chloride 12/21/2015 107  96 - 112 mEq/L Final  . CO2 12/21/2015 24  19 - 32 mEq/L Final  . Glucose, Bld 12/21/2015 135* 70 - 99 mg/dL Final  . BUN 12/21/2015 14  6 - 23 mg/dL Final  . Creatinine, Ser 12/21/2015 2.36* 0.40 - 1.50 mg/dL Final  . Calcium 12/21/2015 8.6  8.4 - 10.5 mg/dL Final  . GFR 12/21/2015 34.80* >60.00 mL/min Final  . Cholesterol 12/21/2015  147  0 - 200 mg/dL Final  . Triglycerides 12/21/2015 122.0  0.0 - 149.0 mg/dL Final  . HDL 12/21/2015 47.60  >39.00 mg/dL Final  . VLDL 12/21/2015 24.4  0.0 - 40.0 mg/dL Final  . LDL Cholesterol 12/21/2015 75  0 - 99 mg/dL Final  . Total CHOL/HDL Ratio 12/21/2015 3   Final  . NonHDL 12/21/2015 99.41   Final    Physical Examination:  BP 122/68   Pulse 72   Temp 97.9 F (36.6 C)   Resp 14   Ht 5\' 8"  (1.727 m)   Wt 139 lb 12.8 oz (63.4 kg)   SpO2 96%   BMI 21.26 kg/m     ASSESSMENT/PLAN: 1. Diabetes type 2, nonobese  His A1c indicates fair control with the levelNow 7.1 He is checking blood sugars fasting only and these are at times variable  Since he just started 5 mg prednisone recently he does need to check readings after meals which he has not been doing Not clear why he has lost weight For now he will try to cut back on drinks with sugar further and less high fat foods like cheese Also needs to restart walking  2. Renal failure with stable creatinine    3.  Hypertension:  Control has been good  4.  Hypercholesterolemia: He is taking 10 mg Lipitor with control    Patient Instructions  Check blood sugars on waking up  3x per week  Also check blood sugars about 2 hours after a meal and do this after different meals by rotation  Recommended blood sugar levels on waking up is 90-130 and about 2 hours after meal is 130-160  Please bring your blood sugar monitor to each visit, thank you  Less cheese, sweet tea  Walk daily       [name] 01/02/2016, 1:03 PM   Note: This office note was prepared with Estate agent. Any transcriptional errors that result from this process are unintentional.

## 2016-01-04 ENCOUNTER — Other Ambulatory Visit: Payer: Self-pay | Admitting: *Deleted

## 2016-01-04 ENCOUNTER — Telehealth: Payer: Self-pay | Admitting: Endocrinology

## 2016-01-04 MED ORDER — ONETOUCH DELICA LANCETS 33G MISC: 0 refills | Status: DC

## 2016-01-04 NOTE — Telephone Encounter (Signed)
Please apply, type 2 diabetes

## 2016-01-04 NOTE — Telephone Encounter (Signed)
Done

## 2016-01-04 NOTE — Telephone Encounter (Signed)
[facility] stated [facility] need [facility] Dx code for the ONETOUCH DELICA LANCETS 99991111 MISC in order for medicare to pay.

## 2016-01-08 ENCOUNTER — Encounter (HOSPITAL_COMMUNITY): Payer: Self-pay | Admitting: Emergency Medicine

## 2016-01-08 ENCOUNTER — Inpatient Hospital Stay (HOSPITAL_COMMUNITY)
Admission: EM | Admit: 2016-01-08 | Discharge: 2016-01-11 | DRG: 637 | Disposition: A | Discharge status: Home / Self Care | Payer: Medicare Other | Attending: Internal Medicine | Admitting: Internal Medicine

## 2016-01-08 DIAGNOSIS — Z87891 Personal history of nicotine dependence: Secondary | ICD-10-CM

## 2016-01-08 DIAGNOSIS — I4581 Long QT syndrome: Secondary | ICD-10-CM | POA: Diagnosis present

## 2016-01-08 DIAGNOSIS — R509 Fever, unspecified: Secondary | ICD-10-CM | POA: Diagnosis not present

## 2016-01-08 DIAGNOSIS — E43 Unspecified severe protein-calorie malnutrition: Secondary | ICD-10-CM | POA: Diagnosis not present

## 2016-01-08 DIAGNOSIS — I2699 Other pulmonary embolism without acute cor pulmonale: Secondary | ICD-10-CM | POA: Diagnosis present

## 2016-01-08 DIAGNOSIS — Z8249 Family history of ischemic heart disease and other diseases of the circulatory system: Secondary | ICD-10-CM

## 2016-01-08 DIAGNOSIS — E876 Hypokalemia: Secondary | ICD-10-CM

## 2016-01-08 DIAGNOSIS — E1165 Type 2 diabetes mellitus with hyperglycemia: Secondary | ICD-10-CM | POA: Diagnosis not present

## 2016-01-08 DIAGNOSIS — E1129 Type 2 diabetes mellitus with other diabetic kidney complication: Secondary | ICD-10-CM

## 2016-01-08 DIAGNOSIS — E860 Dehydration: Secondary | ICD-10-CM | POA: Diagnosis not present

## 2016-01-08 DIAGNOSIS — N183 Chronic kidney disease, stage 3 (moderate): Secondary | ICD-10-CM | POA: Diagnosis present

## 2016-01-08 DIAGNOSIS — Z833 Family history of diabetes mellitus: Secondary | ICD-10-CM

## 2016-01-08 DIAGNOSIS — Z681 Body mass index (BMI) 19 or less, adult: Secondary | ICD-10-CM

## 2016-01-08 DIAGNOSIS — E1121 Type 2 diabetes mellitus with diabetic nephropathy: Secondary | ICD-10-CM | POA: Diagnosis present

## 2016-01-08 DIAGNOSIS — R739 Hyperglycemia, unspecified: Secondary | ICD-10-CM | POA: Diagnosis not present

## 2016-01-08 DIAGNOSIS — R7989 Other specified abnormal findings of blood chemistry: Secondary | ICD-10-CM | POA: Diagnosis present

## 2016-01-08 DIAGNOSIS — L03114 Cellulitis of left upper limb: Secondary | ICD-10-CM | POA: Diagnosis present

## 2016-01-08 DIAGNOSIS — I129 Hypertensive chronic kidney disease with stage 1 through stage 4 chronic kidney disease, or unspecified chronic kidney disease: Secondary | ICD-10-CM | POA: Diagnosis present

## 2016-01-08 DIAGNOSIS — N179 Acute kidney failure, unspecified: Secondary | ICD-10-CM | POA: Diagnosis not present

## 2016-01-08 DIAGNOSIS — I10 Essential (primary) hypertension: Secondary | ICD-10-CM | POA: Diagnosis present

## 2016-01-08 DIAGNOSIS — T451X5A Adverse effect of antineoplastic and immunosuppressive drugs, initial encounter: Secondary | ICD-10-CM | POA: Diagnosis present

## 2016-01-08 DIAGNOSIS — E861 Hypovolemia: Secondary | ICD-10-CM | POA: Diagnosis present

## 2016-01-08 DIAGNOSIS — R609 Edema, unspecified: Secondary | ICD-10-CM

## 2016-01-08 DIAGNOSIS — Z91120 Patient's intentional underdosing of medication regimen due to financial hardship: Secondary | ICD-10-CM

## 2016-01-08 DIAGNOSIS — T383X6A Underdosing of insulin and oral hypoglycemic [antidiabetic] drugs, initial encounter: Secondary | ICD-10-CM | POA: Diagnosis present

## 2016-01-08 DIAGNOSIS — L03113 Cellulitis of right upper limb: Secondary | ICD-10-CM | POA: Diagnosis present

## 2016-01-08 DIAGNOSIS — C799 Secondary malignant neoplasm of unspecified site: Secondary | ICD-10-CM | POA: Diagnosis not present

## 2016-01-08 DIAGNOSIS — R9431 Abnormal electrocardiogram [ECG] [EKG]: Secondary | ICD-10-CM | POA: Diagnosis present

## 2016-01-08 DIAGNOSIS — E1122 Type 2 diabetes mellitus with diabetic chronic kidney disease: Secondary | ICD-10-CM | POA: Diagnosis present

## 2016-01-08 DIAGNOSIS — Z86711 Personal history of pulmonary embolism: Secondary | ICD-10-CM

## 2016-01-08 DIAGNOSIS — R5383 Other fatigue: Secondary | ICD-10-CM | POA: Diagnosis not present

## 2016-01-08 DIAGNOSIS — Z7984 Long term (current) use of oral hypoglycemic drugs: Secondary | ICD-10-CM

## 2016-01-08 DIAGNOSIS — Z7952 Long term (current) use of systemic steroids: Secondary | ICD-10-CM

## 2016-01-08 DIAGNOSIS — E1169 Type 2 diabetes mellitus with other specified complication: Secondary | ICD-10-CM | POA: Diagnosis present

## 2016-01-08 DIAGNOSIS — R6510 Systemic inflammatory response syndrome (SIRS) of non-infectious origin without acute organ dysfunction: Secondary | ICD-10-CM | POA: Diagnosis not present

## 2016-01-08 DIAGNOSIS — D630 Anemia in neoplastic disease: Secondary | ICD-10-CM | POA: Diagnosis present

## 2016-01-08 DIAGNOSIS — Z79899 Other long term (current) drug therapy: Secondary | ICD-10-CM

## 2016-01-08 DIAGNOSIS — Z86718 Personal history of other venous thrombosis and embolism: Secondary | ICD-10-CM

## 2016-01-08 DIAGNOSIS — C61 Malignant neoplasm of prostate: Secondary | ICD-10-CM

## 2016-01-08 DIAGNOSIS — E785 Hyperlipidemia, unspecified: Secondary | ICD-10-CM | POA: Diagnosis present

## 2016-01-08 DIAGNOSIS — E162 Hypoglycemia, unspecified: Secondary | ICD-10-CM | POA: Diagnosis present

## 2016-01-08 LAB — BLOOD GAS, VENOUS
Acid-Base Excess: 2.7 mmol/L — ABNORMAL HIGH (ref 0.0–2.0)
Bicarbonate: 22.3 mmol/L (ref 20.0–28.0)
O2 Saturation: 87.9 %
PCO2 VEN: 20.5 mmHg — AB (ref 44.0–60.0)
PH VEN: 7.639 — AB (ref 7.250–7.430)
PO2 VEN: 46.9 mmHg — AB (ref 32.0–45.0)
Patient temperature: 98.6

## 2016-01-08 LAB — BASIC METABOLIC PANEL
ANION GAP: 17 — AB (ref 5–15)
BUN: 37 mg/dL — ABNORMAL HIGH (ref 6–20)
CALCIUM: 8 mg/dL — AB (ref 8.9–10.3)
CO2: 23 mmol/L (ref 22–32)
Chloride: 96 mmol/L — ABNORMAL LOW (ref 101–111)
Creatinine, Ser: 3.12 mg/dL — ABNORMAL HIGH (ref 0.61–1.24)
GFR, EST AFRICAN AMERICAN: 21 mL/min — AB (ref >60)
GFR, EST NON AFRICAN AMERICAN: 18 mL/min — AB (ref >60)
Glucose, Bld: 424 mg/dL — ABNORMAL HIGH (ref 65–99)
POTASSIUM: 2.1 mmol/L — AB (ref 3.5–5.1)
SODIUM: 136 mmol/L (ref 135–145)

## 2016-01-08 LAB — URINE MICROSCOPIC-ADD ON

## 2016-01-08 LAB — HEPATIC FUNCTION PANEL
ALT: 11 U/L — ABNORMAL LOW (ref 17–63)
AST: 29 U/L (ref 15–41)
Albumin: 2.9 g/dL — ABNORMAL LOW (ref 3.5–5.0)
Alkaline Phosphatase: 74 U/L (ref 38–126)
BILIRUBIN DIRECT: 0.4 mg/dL (ref 0.1–0.5)
Indirect Bilirubin: 1.8 mg/dL — ABNORMAL HIGH (ref 0.3–0.9)
TOTAL PROTEIN: 6.3 g/dL — AB (ref 6.5–8.1)
Total Bilirubin: 2.2 mg/dL — ABNORMAL HIGH (ref 0.3–1.2)

## 2016-01-08 LAB — LIPASE, BLOOD: LIPASE: 30 U/L (ref 11–51)

## 2016-01-08 LAB — CBC
HEMATOCRIT: 36.8 % — AB (ref 39.0–52.0)
HEMOGLOBIN: 12.2 g/dL — AB (ref 13.0–17.0)
MCH: 28.4 pg (ref 26.0–34.0)
MCHC: 33.2 g/dL (ref 30.0–36.0)
MCV: 85.6 fL (ref 78.0–100.0)
Platelets: 185 10*3/uL (ref 150–400)
RBC: 4.3 MIL/uL (ref 4.22–5.81)
RDW: 14.3 % (ref 11.5–15.5)
WBC: 8.9 10*3/uL (ref 4.0–10.5)

## 2016-01-08 LAB — URINALYSIS, ROUTINE W REFLEX MICROSCOPIC
BILIRUBIN URINE: NEGATIVE
Glucose, UA: 100 mg/dL — AB
Ketones, ur: NEGATIVE mg/dL
LEUKOCYTES UA: NEGATIVE
NITRITE: NEGATIVE
PH: 5 (ref 5.0–8.0)
Protein, ur: NEGATIVE mg/dL
SPECIFIC GRAVITY, URINE: 1.011 (ref 1.005–1.030)

## 2016-01-08 LAB — MAGNESIUM: MAGNESIUM: 1.1 mg/dL — AB (ref 1.7–2.4)

## 2016-01-08 LAB — CBG MONITORING, ED: GLUCOSE-CAPILLARY: 377 mg/dL — AB (ref 65–99)

## 2016-01-08 MED ORDER — POTASSIUM CHLORIDE CRYS ER 20 MEQ PO TBCR
40.0000 meq | EXTENDED_RELEASE_TABLET | Freq: Once | ORAL | Status: AC
  Administered 2016-01-08: 40 meq via ORAL
  Filled 2016-01-08: qty 2

## 2016-01-08 MED ORDER — POTASSIUM CHLORIDE 10 MEQ/100ML IV SOLN
10.0000 meq | INTRAVENOUS | Status: AC
  Administered 2016-01-08 – 2016-01-09 (×6): 10 meq via INTRAVENOUS
  Filled 2016-01-08 (×6): qty 100

## 2016-01-08 MED ORDER — SODIUM CHLORIDE 0.9 % IV BOLUS (SEPSIS)
1000.0000 mL | Freq: Once | INTRAVENOUS | Status: AC
  Administered 2016-01-08: 1000 mL via INTRAVENOUS

## 2016-01-08 NOTE — ED Notes (Signed)
EKG given to EDP,Schlossman,MD., for review. 

## 2016-01-08 NOTE — H&P (Signed)
History and Physical    [name] [id number] DOB: 1940-04-21 DOA: 01/08/2016  PCP: [name], FNP Consultants:  Endocrine - Dwyane Dee; Nephrology - Florene Glen; Urology- Levie Heritage - oncology Patient coming from: home - lives with wife  Chief Complaint: hypergylcemia  HPI: [name] is a 75 y.o. male with medical history significant of castration-resistant metastatic prostate cancer, started taking Zytiga and prednisone on 9/17; DM; h/o DVT, on Coumadin; CKD; HTN; and HLD.  Patient reports that he is just zapped of energy.  His family brought him in because his sugar was high.  [facility] said [facility] used [facility] supply of diabetic medicine and so he couldn't afford to take it.  He went without it from Wednesday until now.  Would go to kitchen to make a cup of coffee and would have to sit and drink it there because he didn't have the energy to return to his room.   Meanwhile, has also been taking prednisone.   ED Course: 1L bolus, KCl 60 mEq IV and 40 mEq PO  Review of Systems: As per HPI; otherwise 10 point review of systems reviewed and negative.   Ambulatory Status:  Ambulates without assistance  Past Medical History:  Diagnosis Date  . Cataract   . Diabetes mellitus   . Hyperlipidemia   . Hypertension   . Prostate cancer ([place])   . Renal insufficiency     Past Surgical History:  Procedure Laterality Date  . PROSTATE SURGERY     radiation therapy only    Social History   Social History  . Marital status: Married    Spouse name: N/A  . Number of children: N/A  . Years of education: N/A   Occupational History  . retired    Social History Main Topics  . Smoking status: Former Smoker    Years: 10.00    Quit date: 2000  . Smokeless tobacco: Never Used  . Alcohol use No  . Drug use: No  . Sexual activity: Not on file   Other Topics Concern  . Not on file   Social History Narrative  . No narrative on file    No Known Allergies  Family History    Problem Relation Age of Onset  . Diabetes Father   . Diabetes Sister   . Hypertension Sister   . Hypertension Brother   . Heart disease Neg Hx     Prior to Admission medications   Medication Sig Start Date End Date Taking? Authorizing Provider  abiraterone Acetate (ZYTIGA) 250 MG tablet Take 4 tablets (1,000 mg total) by mouth daily. Take on an empty stomach 1 hour before or 2 hours after a meal 12/14/15  Yes Wyatt Portela, MD  atenolol (TENORMIN) 100 MG tablet Take 50 mg by mouth daily.  02/09/13  Yes Historical Provider, MD  atorvastatin (LIPITOR) 10 MG tablet Take 1 tablet (10 mg total) by mouth daily after supper. 10/10/15  Yes Elayne Snare, MD  furosemide (LASIX) 80 MG tablet Take 80 mg by mouth daily.    Yes Historical Provider, MD  GLIPIZIDE XL 5 MG 24 hr tablet TAKE 1 TABLET BY MOUTH DAILY WITH BREAKFAST. Patient taking differently: TAKE 5mg  TABLET BY MOUTH DAILY WITH BREAKFAST. 11/24/15  Yes Elayne Snare, MD  ONE TOUCH ULTRA TEST test strip USE TO TEST ONCE DAILY 08/29/15  Yes Elayne Snare, MD  [facility] DELICA LANCETS [id number] MISC USE TO TEST ONCE DAILY Dx code E11.65 01/04/16  Yes Elayne Snare, MD  pioglitazone (ACTOS) 30 MG tablet TAKE 1 TABLET BY MOUTH EVERY DAY Patient taking differently: TAKE 30mg  TABLET BY MOUTH EVERY DAY 01/02/16  Yes Elayne Snare, MD  potassium chloride SA (KLOR-CON M20) 20 MEQ tablet Take 1 tablet (20 mEq total) by mouth daily. 09/01/15  Yes [name], FNP  predniSONE (DELTASONE) 5 MG tablet Take 1 tablet (5 mg total) by mouth daily with breakfast. 12/14/15  Yes Wyatt Portela, MD  TRADJENTA 5 MG TABS tablet TAKE 1 TABLET (5 MG TOTAL) BY MOUTH DAILY. 11/28/15  Yes Elayne Snare, MD    Physical Exam: Vitals:   01/08/16 1958 01/08/16 1959  BP: 122/81   Pulse: 89   Resp: 16   Temp: 97.9 F (36.6 C)   TempSrc: Oral   SpO2: 98%   Weight:  65.8 kg (145 lb)  Height:  5' 8.5" (1.74 m)     General:  Appears calm and comfortable and is NAD Eyes:  PERRL, EOMI, normal lids,  iris ENT:  grossly normal hearing, lips & tongue, mmm Neck:  no LAD, masses or thyromegaly Cardiovascular:  RRR, no m/r/g. No LE edema.  Respiratory:  CTA bilaterally, no w/r/r. Normal respiratory effort. Abdomen:  soft, ntnd, NABS Skin:  no rash or induration seen on limited exam Musculoskeletal:  grossly normal tone BUE/BLE, good ROM, no bony abnormality; mild TTP and erythema along the ulnar head Psychiatric: grossly normal mood and affect, speech fluent and appropriate, AOx3, anxious - laughs inappropriately, acknowledges that doctors, nurses make him nervous Neurologic:  CN 2-12 grossly intact, moves all extremities in coordinated fashion, sensation intact  Labs on Admission: I have personally reviewed following labs and imaging studies  CBC:  Recent Labs Lab 01/08/16 2052  WBC 8.9  HGB 12.2*  HCT 36.8*  MCV 85.6  PLT [id number]   Basic Metabolic Panel:  Recent Labs Lab 01/08/16 2052  NA 136  K 2.1*  CL 96*  CO2 23  GLUCOSE 424*  BUN 37*  CREATININE 3.12*  CALCIUM 8.0*   GFR: Estimated Creatinine Clearance: 19.3 mL/min (by C-G formula based on SCr of 3.12 mg/dL (H)). Liver Function Tests: No results for input(s): AST, ALT, ALKPHOS, BILITOT, PROT, ALBUMIN in the last 168 hours. No results for input(s): LIPASE, AMYLASE in the last 168 hours. No results for input(s): AMMONIA in the last 168 hours. Coagulation Profile: No results for input(s): INR, PROTIME in the last 168 hours. Cardiac Enzymes: No results for input(s): CKTOTAL, CKMB, CKMBINDEX, TROPONINI in the last 168 hours. BNP (last 3 results) No results for input(s): PROBNP in the last 8760 hours. HbA1C: No results for input(s): HGBA1C in the last 72 hours. CBG:  Recent Labs Lab 01/08/16 1958  GLUCAP 377*   Lipid Profile: No results for input(s): CHOL, HDL, LDLCALC, TRIG, CHOLHDL, LDLDIRECT in the last 72 hours. Thyroid Function Tests: No results for input(s): TSH, T4TOTAL, FREET4, T3FREE, THYROIDAB in  the last 72 hours. Anemia Panel: No results for input(s): VITAMINB12, FOLATE, FERRITIN, TIBC, IRON, RETICCTPCT in the last 72 hours. Urine analysis:    Component Value Date/Time   COLORURINE YELLOW 09/22/2014 [place] 09/22/2014 0757   LABSPEC 1.020 09/22/2014 0757   PHURINE 5.5 09/22/2014 0757   GLUCOSEU NEGATIVE 09/22/2014 0757   HGBUR NEGATIVE 09/22/2014 0757   BILIRUBINUR NEGATIVE 09/22/2014 0757   BILIRUBINUR n 04/30/2011 1534   KETONESUR NEGATIVE 09/22/2014 0757   PROTEINUR n 04/30/2011 1534   UROBILINOGEN 0.2 09/22/2014 0757   NITRITE NEGATIVE 09/22/2014  [place] 09/22/2014 0757    Creatinine Clearance: Estimated Creatinine Clearance: 19.3 mL/min (by C-G formula based on SCr of 3.12 mg/dL (H)).  Sepsis Labs: @LABRCNTIP (procalcitonin:4,lacticidven:4) )No results found for this or any previous visit (from the past 240 hour(s)).   Radiological Exams on Admission: No results found.  EKG: Independently reviewed.  NSR with rate 84; generally unchanged from prior with no apparent acute ischemia but increased PVCs and prolonged Qt (Qtc 522)   Assessment/Plan Principal Problem:   Hyperglycemia Active Problems:   Diabetes mellitus type 2 in nonobese (HCC)   Hyperlipidemia associated with type 2 diabetes mellitus (HCC)   Essential hypertension   PROSTATE CANCER, HX OF   Pulmonary embolus (HCC)   Hypokalemia   Acute kidney injury superimposed on chronic kidney disease (HCC)   Prolonged Q-T interval on ECG   Hyperglycemia -Patient with poorly controlled DM and anion gap but no acidosis -Suspect that this is due to medication non-compliance, as he reports being unable to afford medication since Wednesday and so not taking it -Last A1c was 2 weeks ago and was 7.1, so his glycemic control is usually better -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 -Will place in observation for now and try to correct with IVF and  SSI -Patient will be NPO for tonight with q4h accuchecks and SSI coverage -He may be able to resume home medications tomorrow, but will likely need assistance in obtaining medications - will place SW consult  Hypokalemia -Uncertain etiology other than Lasix -Will hold Lasix -Given 60 mEq IV and 40 mEq PO but Er physician -Will recheck BMP in AM -Suspect that PVCs and possibly also QTc prolongation are related to profound hypokalemia -Will monitor on telemetry  AKI on CKD -Baseline creatinine appears to be 2.36 or so -Current creatinine is 3.12 -Likely related to hypovolemia and hyperglycemia -Will replete with IVF and follow  HTN -Good control for now -Contine Atenolol  HLD -Continue Lipitor  H/o prostate CA -Metastatic, refractory disease -Recently started on Zytiga and prednisone (reports left wrist pain since starting) -Will continue for now -Will add Dr. Alen Blew to the treatment team  H/o PE -2012 -No longer on anticoagulation -Will give Lovenox for DVT prophylaxis  DVT prophylaxis: Lovenox Code Status: Full - confirmed with patient Family Communication: None present Disposition Plan:  Home once clinically improved Consults called: Dr. Alen Blew added to treatment team  Admission status: Observation, telemetry    Karmen Bongo MD [facility]  If 7PM-7AM, please contact night-coverage [email or web address] Password [email or web address]  01/08/2016, 10:49 PM

## 2016-01-08 NOTE — ED Notes (Signed)
Per amy in Lab potassium 2.1, RN and MD notified.

## 2016-01-08 NOTE — ED Provider Notes (Signed)
[facility] DEPT Provider Note   CSN: [id number] Arrival date & time: 01/08/16  1943     History   Chief Complaint Chief Complaint  Patient presents with  . Hyperglycemia    HPI [name] is a 75 y.o. male.  HPI   Presents with concern for generalized weakness, fatigue, elevated blood glucose, decreased appetite and n/v. Fatigue over last week, worsened over last few days. N/V yesterday. Decreased appetite over last week. Out of DM medications pioglitazone for last 2-3 days [employer] said they would not pay, so has not been able to fill or take his medication In addition, Started prednisone 9/17 , zytiga Hadn't checked sugar in the last few days Last Friday was 260 Today was 385 No fever, no cough, no urinary burning  No falls, no headache, no focal neuro symptoms     Past Medical History:  Diagnosis Date  . Cataract   . Diabetes mellitus   . Hyperlipidemia   . Hypertension   . Prostate cancer ([place])   . Renal insufficiency     Patient Active Problem List   Diagnosis Date Noted  . Hyperglycemia 01/08/2016  . Hypokalemia 01/08/2016  . Acute kidney injury superimposed on chronic kidney disease ([place]) 01/08/2016  . Prolonged Q-T interval on ECG 01/08/2016  . 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 Bilateral lower extremity edema 09/07/2013  . Type II or unspecified type diabetes mellitus without mention of complication, uncontrolled 05/20/2013  . Pulmonary embolus ([place]) 09/01/2010  . Diabetes mellitus type 2 in nonobese ([place]) 09/15/2006  . Hyperlipidemia associated with type 2 diabetes mellitus ([place]) 09/13/2006  . Essential hypertension 09/13/2006  . Disorder resulting from impaired renal function 09/13/2006  . PROSTATE CANCER, HX OF 09/13/2006    Past Surgical History:  Procedure Laterality Date  . PROSTATE SURGERY     radiation therapy only       Home Medications    Prior to Admission medications   Medication Sig Start Date End Date  Taking? Authorizing Provider  abiraterone Acetate (ZYTIGA) 250 MG tablet Take 4 tablets (1,000 mg total) by mouth daily. Take on an empty stomach 1 hour before or 2 hours after a meal 12/14/15  Yes Wyatt Portela, MD  atenolol (TENORMIN) 100 MG tablet Take 50 mg by mouth daily.  02/09/13  Yes Historical Provider, MD  atorvastatin (LIPITOR) 10 MG tablet Take 1 tablet (10 mg total) by mouth daily after supper. 10/10/15  Yes Elayne Snare, MD  furosemide (LASIX) 80 MG tablet Take 80 mg by mouth daily.    Yes Historical Provider, MD  GLIPIZIDE XL 5 MG 24 hr tablet TAKE 1 TABLET BY MOUTH DAILY WITH BREAKFAST. Patient taking differently: TAKE 5mg  TABLET BY MOUTH DAILY WITH BREAKFAST. 11/24/15  Yes Elayne Snare, MD  ONE TOUCH ULTRA TEST test strip USE TO TEST ONCE DAILY 08/29/15  Yes Elayne Snare, MD  [facility] DELICA LANCETS [id number] MISC USE TO TEST ONCE DAILY Dx code E11.65 01/04/16  Yes Elayne Snare, MD  pioglitazone (ACTOS) 30 MG tablet TAKE 1 TABLET BY MOUTH EVERY DAY Patient taking differently: TAKE 30mg  TABLET BY MOUTH EVERY DAY 01/02/16  Yes Elayne Snare, MD  potassium chloride SA (KLOR-CON M20) 20 MEQ tablet Take 1 tablet (20 mEq total) by mouth daily. 09/01/15  Yes [name], FNP  predniSONE (DELTASONE) 5 MG tablet Take 1 tablet (5 mg total) by mouth daily with breakfast. 12/14/15  Yes Wyatt Portela, MD  TRADJENTA 5 MG TABS tablet  TAKE 1 TABLET (5 MG TOTAL) BY MOUTH DAILY. 11/28/15  Yes Elayne Snare, MD    Family History Family History  Problem Relation Age of Onset  . Diabetes Father   . Diabetes Sister   . Hypertension Sister   . Hypertension Brother   . Heart disease Neg Hx     Social History Social History  Substance Use Topics  . Smoking status: Former Smoker    Years: 10.00    Quit date: 2000  . Smokeless tobacco: Never Used  . Alcohol use No     Allergies   Review of patient's allergies indicates no known allergies.   Review of Systems Review of Systems  Constitutional: Positive for  appetite change and fatigue. Negative for fever.  HENT: Negative for sore throat.   Eyes: Negative for visual disturbance.  Respiratory: Negative for shortness of breath.   Cardiovascular: Negative for chest pain.  Gastrointestinal: Positive for nausea and vomiting. Negative for abdominal pain, constipation and diarrhea.  Genitourinary: Positive for decreased urine volume. Negative for difficulty urinating.  Musculoskeletal: Positive for arthralgias (wrists sore since starting new medicine). Negative for back pain and neck stiffness.  Skin: Negative for rash.  Neurological: Negative for syncope, weakness, numbness and headaches.     Physical Exam Updated Vital Signs BP 126/74 (BP Location: Right Arm)   Pulse 87   Temp 98.5 F (36.9 C) (Oral)   Resp 20   Ht 5' 8.5" (1.74 m)   Wt 130 lb 8.2 oz (59.2 kg)   SpO2 99%   BMI 19.56 kg/m   Physical Exam  Constitutional: He is oriented to person, place, and time. He appears well-developed and well-nourished. No distress.  HENT:  Head: Normocephalic and atraumatic.  Eyes: Conjunctivae and EOM are normal.  Cataract left eye  Neck: Normal range of motion.  Cardiovascular: Normal rate, regular rhythm, normal heart sounds and intact distal pulses.  Exam reveals no gallop and no friction rub.   No murmur heard. Pulmonary/Chest: Effort normal and breath sounds normal. Tachypnea (intermittently during h/p, however speaking in full sentences) noted. No respiratory distress. He has no wheezes. He has no rales.  Abdominal: Soft. He exhibits no distension. There is no tenderness. There is no guarding.  Musculoskeletal: He exhibits no edema.  Neurological: He is alert and oriented to person, place, and time.  Skin: Skin is warm and dry. He is not diaphoretic.  Nursing note and vitals reviewed.    ED Treatments / Results  Labs (all labs ordered are listed, but only abnormal results are displayed) Labs Reviewed  BASIC METABOLIC PANEL -  Abnormal; Notable for the following:       Result Value   Potassium 2.1 (*)    Chloride 96 (*)    Glucose, Bld 424 (*)    BUN 37 (*)    Creatinine, Ser 3.12 (*)    Calcium 8.0 (*)    GFR calc non Af Amer 18 (*)    GFR calc Af Amer 21 (*)    Anion gap 17 (*)    All other components within normal limits  CBC - Abnormal; Notable for the following:    Hemoglobin 12.2 (*)    HCT 36.8 (*)    All other components within normal limits  URINALYSIS, ROUTINE W REFLEX MICROSCOPIC (NOT AT [facility]) - Abnormal; Notable for the following:    APPearance CLOUDY (*)    Glucose, UA 100 (*)    Hgb urine dipstick SMALL (*)  All other components within normal limits  MAGNESIUM - Abnormal; Notable for the following:    Magnesium 1.1 (*)    All other components within normal limits  HEPATIC FUNCTION PANEL - Abnormal; Notable for the following:    Total Protein 6.3 (*)    Albumin 2.9 (*)    ALT 11 (*)    Total Bilirubin 2.2 (*)    Indirect Bilirubin 1.8 (*)    All other components within normal limits  BLOOD GAS, VENOUS - Abnormal; Notable for the following:    pH, Ven 7.639 (*)    pCO2, Ven 20.5 (*)    pO2, Ven 46.9 (*)    Acid-Base Excess 2.7 (*)    All other components within normal limits  URINE MICROSCOPIC-ADD ON - Abnormal; Notable for the following:    Squamous Epithelial / LPF 0-5 (*)    Bacteria, UA FEW (*)    All other components within normal limits  BASIC METABOLIC PANEL - Abnormal; Notable for the following:    Potassium 2.5 (*)    Chloride 98 (*)    Glucose, Bld 359 (*)    BUN 34 (*)    Creatinine, Ser 2.85 (*)    Calcium 7.5 (*)    GFR calc non Af Amer 20 (*)    GFR calc Af Amer 24 (*)    All other components within normal limits  CBC - Abnormal; Notable for the following:    WBC 11.8 (*)    RBC 3.35 (*)    Hemoglobin 9.6 (*)    HCT 28.5 (*)    All other components within normal limits  GLUCOSE, CAPILLARY - Abnormal; Notable for the following:    Glucose-Capillary 366  (*)    All other components within normal limits  GLUCOSE, CAPILLARY - Abnormal; Notable for the following:    Glucose-Capillary 102 (*)    All other components within normal limits  CBG MONITORING, ED - Abnormal; Notable for the following:    Glucose-Capillary 377 (*)    All other components within normal limits  CULTURE, BLOOD (ROUTINE X 2)  CULTURE, BLOOD (ROUTINE X 2)  URINE CULTURE  LIPASE, BLOOD  GLUCOSE, CAPILLARY  GLUCOSE, CAPILLARY  CBG MONITORING, ED    EKG  EKG Interpretation  Date/Time:  Sunday January 08 2016 22:12:18 EDT Ventricular Rate:  84 PR Interval:    QRS Duration: 94 QT Interval:  441 QTC Calculation: 522 R Axis:   60 Text Interpretation:  Sinus tachycardia Paired ventricular premature complexes Aberrant conduction of SV complex(es) Anterior infarct, old Minimal ST depression, inferior leads Prolonged QT interval PVC, prolonged QT new from prior Confirmed by [facility] MD, [place]y ([id number]) on 01/08/2016 10:24:09 PM       Radiology Dg Chest 2 View  Result Date: 01/09/2016 CLINICAL DATA:  Fevers and hyperglycemia EXAM: CHEST  2 VIEW COMPARISON:  11/17/2015 FINDINGS: Cardiac shadow is within normal limits. The lungs are well aerated bilaterally. Patchy changes seen on prior PET-CT are not well appreciated on this exam. No acute bony abnormality is noted. IMPRESSION: No active cardiopulmonary disease. Electronically Signed   By: Inez Catalina M.D.   On: 01/09/2016 11:14   Dg Wrist Complete Left  Result Date: 01/09/2016 CLINICAL DATA:  Bilateral arm and hand weakness, fall, initial encounter. EXAM: LEFT WRIST - COMPLETE 3+ VIEW COMPARISON:  None. FINDINGS: Slight irregularity is seen along the dorsal aspect of the wrist on the lateral view, with soft tissue swelling. Irregularity appears slightly lower than expected  for a triquetral avulsion fracture but it is not excluded. Otherwise, no acute osseous abnormality. Degenerative changes are seen in the wrist, including  chondrocalcinosis. IMPRESSION: 1. Slight irregularity along the dorsal aspect of the carpal bones. Difficult to definitively exclude a triquetral avulsion fracture. Please see discussion above. 2. Degenerative changes in the wrist. Electronically Signed   By: Lorin Picket M.D.   On: 01/09/2016 11:08   Dg Wrist Complete Right  Result Date: 01/09/2016 CLINICAL DATA:  Wrist pain EXAM: RIGHT WRIST - COMPLETE 3+ VIEW COMPARISON:  None. FINDINGS: No distal radius or ulnar fracture. Radiocarpal joint is intact. No carpal fracture. No soft tissue abnormality. IMPRESSION: No fracture or dislocation. Electronically Signed   By: Suzy Bouchard M.D.   On: 01/09/2016 11:15   Dg Hand Complete Left  Result Date: 01/09/2016 CLINICAL DATA:  Swelling. EXAM: LEFT HAND - COMPLETE 3+ VIEW COMPARISON:  01/09/2016. FINDINGS: Diffuse osteopenia degenerative change. No acute abnormality identified. IMPRESSION: Diffuse osteopenia and degenerative change. No acute abnormality identified. Electronically Signed   By: Marcello Moores  Register   On: 01/09/2016 11:05   Dg Hand Complete Right  Result Date: 01/09/2016 CLINICAL DATA:  Weakness. EXAM: RIGHT HAND - COMPLETE 3+ VIEW COMPARISON:  No recent prior. FINDINGS: Diffuse osteopenia. No acute bony abnormality identified. Diffuse degenerative change. No evidence of fracture dislocation. IMPRESSION: Diffuse osteopenia and degenerative change. No acute bony abnormality identified. Electronically Signed   By: Marcello Moores  Register   On: 01/09/2016 11:07    Procedures Procedures (including critical care time)  Medications Ordered in ED Medications  abiraterone Acetate (ZYTIGA) tablet 1,000 mg (1,000 mg Oral Given 01/09/16 0916)  predniSONE (DELTASONE) tablet 5 mg (5 mg Oral Given 01/09/16 0915)  atorvastatin (LIPITOR) tablet 10 mg (not administered)  atenolol (TENORMIN) tablet 50 mg (50 mg Oral Given 01/09/16 0915)  acetaminophen (TYLENOL) tablet 650 mg (650 mg Oral Given 01/09/16 0242)      Or  acetaminophen (TYLENOL) suppository 650 mg ( Rectal See Alternative 01/09/16 0242)  docusate sodium (COLACE) capsule 100 mg (100 mg Oral Given 01/09/16 0915)  insulin aspart (novoLOG) injection 0-9 Units (0 Units Subcutaneous Not Given 01/09/16 1200)  enoxaparin (LOVENOX) injection 30 mg (30 mg Subcutaneous Given 01/09/16 0059)  sodium chloride flush (NS) 0.9 % injection 3 mL (3 mLs Intravenous Not Given 01/09/16 1000)  lactated ringers infusion ( Intravenous New Bag/Given 01/09/16 0058)  potassium chloride SA (K-DUR,KLOR-CON) CR tablet 40 mEq (not administered)  sodium chloride 0.9 % bolus 1,000 mL (1,000 mLs Intravenous New Bag/Given 01/08/16 2057)  potassium chloride 10 mEq in 100 mL IVPB (10 mEq Intravenous New Bag/Given 01/09/16 0552)  potassium chloride SA (K-DUR,KLOR-CON) CR tablet 40 mEq (40 mEq Oral Given 01/08/16 2246)  magnesium sulfate IVPB 2 g 50 mL (2 g Intravenous Given 01/09/16 0209)    CRITICAL CARE: Hypokalemia Performed by: Alvino Chapel   Total critical care time: 30 minutes  Critical care time was exclusive of separately billable procedures and treating other patients.  Critical care was necessary to treat or prevent imminent or life-threatening deterioration.  Critical care was time spent personally by me on the following activities: development of treatment plan with patient and/or surrogate as well as nursing, discussions with consultants, evaluation of patient's response to treatment, examination of patient, obtaining history from patient or surrogate, ordering and performing treatments and interventions, ordering and review of laboratory studies, ordering and review of radiographic studies, pulse oximetry and re-evaluation of patient's condition.  Initial Impression / Assessment and  Plan / ED Course  I have reviewed the triage vital signs and the nursing notes.  Pertinent labs & imaging results that were available during my care of the patient were  reviewed by me and considered in my medical decision making (see chart for details).  Clinical Course   75yo male with history of DM, htn, hlpd, prostate cancer, CKD presents with concern for severe fatigue with hyperglycemia, nausea and vomiting and decreased appetite. Has not been able to fill actos rx for past 2 days or take this medication. Suspect hyperglycemia has led to feelings of low appetite, and decreased po intake has led to low potassium levels and fatigue.  K 2.1.  Bicarb normal and doubt DKA, although currently awaiting urinalysis and vbg for confirmation. Magnesium also ordered and pending.  Patient with AKI also likely secondary to dehydration. Given fluid, K replacement. Will wait for K administration prior to giving insulin.  Pt to be admitted for further care.   Final Clinical Impressions(s) / ED Diagnoses   Final diagnoses:    Hyperglycemia  Hypokalemia  Dehydration  AKI (acute kidney injury) [facility])    New Prescriptions Current Discharge Medication List       Gareth Morgan, MD 01/09/16 1222

## 2016-01-08 NOTE — ED Triage Notes (Signed)
Pt comes from home with complaints of hyperglycemia.  Pt ran out of his medication 2 days ago and the pharmacy would not refill the medication until the 24th.  Reports CBG of 385 at home before arrival.

## 2016-01-08 NOTE — ED Notes (Signed)
Pt stated "I've been out of my diabetic meds x 2 days.  The pharmacy says they gave me 90 pills but I know there wasn't.  I was going to the doctor tomorrow.  I tried to get some from the pharmacy but they told me it would be $200."  Pt remains on monitor.  Family @ [facility].

## 2016-01-08 NOTE — ED Notes (Signed)
Pt assisted to BR.

## 2016-01-09 ENCOUNTER — Observation Stay (HOSPITAL_COMMUNITY): Payer: Medicare Other

## 2016-01-09 DIAGNOSIS — C799 Secondary malignant neoplasm of unspecified site: Secondary | ICD-10-CM | POA: Diagnosis present

## 2016-01-09 DIAGNOSIS — R6510 Systemic inflammatory response syndrome (SIRS) of non-infectious origin without acute organ dysfunction: Secondary | ICD-10-CM | POA: Diagnosis present

## 2016-01-09 DIAGNOSIS — E43 Unspecified severe protein-calorie malnutrition: Secondary | ICD-10-CM | POA: Diagnosis present

## 2016-01-09 DIAGNOSIS — C61 Malignant neoplasm of prostate: Secondary | ICD-10-CM | POA: Diagnosis present

## 2016-01-09 DIAGNOSIS — T451X5A Adverse effect of antineoplastic and immunosuppressive drugs, initial encounter: Secondary | ICD-10-CM | POA: Diagnosis present

## 2016-01-09 DIAGNOSIS — R5383 Other fatigue: Secondary | ICD-10-CM | POA: Diagnosis not present

## 2016-01-09 DIAGNOSIS — L03113 Cellulitis of right upper limb: Secondary | ICD-10-CM | POA: Diagnosis present

## 2016-01-09 DIAGNOSIS — E1121 Type 2 diabetes mellitus with diabetic nephropathy: Secondary | ICD-10-CM | POA: Diagnosis present

## 2016-01-09 DIAGNOSIS — Z681 Body mass index (BMI) 19 or less, adult: Secondary | ICD-10-CM | POA: Diagnosis not present

## 2016-01-09 DIAGNOSIS — E876 Hypokalemia: Secondary | ICD-10-CM | POA: Diagnosis present

## 2016-01-09 DIAGNOSIS — N179 Acute kidney failure, unspecified: Secondary | ICD-10-CM | POA: Diagnosis not present

## 2016-01-09 DIAGNOSIS — E861 Hypovolemia: Secondary | ICD-10-CM | POA: Diagnosis present

## 2016-01-09 DIAGNOSIS — E860 Dehydration: Secondary | ICD-10-CM | POA: Diagnosis not present

## 2016-01-09 DIAGNOSIS — E785 Hyperlipidemia, unspecified: Secondary | ICD-10-CM | POA: Diagnosis present

## 2016-01-09 DIAGNOSIS — M25531 Pain in right wrist: Secondary | ICD-10-CM | POA: Diagnosis not present

## 2016-01-09 DIAGNOSIS — L03114 Cellulitis of left upper limb: Secondary | ICD-10-CM | POA: Diagnosis present

## 2016-01-09 DIAGNOSIS — I129 Hypertensive chronic kidney disease with stage 1 through stage 4 chronic kidney disease, or unspecified chronic kidney disease: Secondary | ICD-10-CM | POA: Diagnosis present

## 2016-01-09 DIAGNOSIS — D72829 Elevated white blood cell count, unspecified: Secondary | ICD-10-CM | POA: Diagnosis present

## 2016-01-09 DIAGNOSIS — E1122 Type 2 diabetes mellitus with diabetic chronic kidney disease: Secondary | ICD-10-CM | POA: Diagnosis present

## 2016-01-09 DIAGNOSIS — R739 Hyperglycemia, unspecified: Secondary | ICD-10-CM | POA: Diagnosis not present

## 2016-01-09 DIAGNOSIS — I4581 Long QT syndrome: Secondary | ICD-10-CM | POA: Diagnosis present

## 2016-01-09 DIAGNOSIS — M19042 Primary osteoarthritis, left hand: Secondary | ICD-10-CM | POA: Diagnosis not present

## 2016-01-09 DIAGNOSIS — N183 Chronic kidney disease, stage 3 (moderate): Secondary | ICD-10-CM | POA: Diagnosis present

## 2016-01-09 DIAGNOSIS — E1165 Type 2 diabetes mellitus with hyperglycemia: Secondary | ICD-10-CM | POA: Diagnosis present

## 2016-01-09 DIAGNOSIS — R7989 Other specified abnormal findings of blood chemistry: Secondary | ICD-10-CM | POA: Diagnosis present

## 2016-01-09 DIAGNOSIS — M19041 Primary osteoarthritis, right hand: Secondary | ICD-10-CM | POA: Diagnosis not present

## 2016-01-09 DIAGNOSIS — E119 Type 2 diabetes mellitus without complications: Secondary | ICD-10-CM | POA: Diagnosis not present

## 2016-01-09 DIAGNOSIS — S6992XA Unspecified injury of left wrist, hand and finger(s), initial encounter: Secondary | ICD-10-CM | POA: Diagnosis not present

## 2016-01-09 DIAGNOSIS — D630 Anemia in neoplastic disease: Secondary | ICD-10-CM | POA: Diagnosis present

## 2016-01-09 DIAGNOSIS — T383X6A Underdosing of insulin and oral hypoglycemic [antidiabetic] drugs, initial encounter: Secondary | ICD-10-CM | POA: Diagnosis present

## 2016-01-09 DIAGNOSIS — R509 Fever, unspecified: Secondary | ICD-10-CM | POA: Diagnosis not present

## 2016-01-09 LAB — CBC
HCT: 28.5 % — ABNORMAL LOW (ref 39.0–52.0)
Hemoglobin: 9.6 g/dL — ABNORMAL LOW (ref 13.0–17.0)
MCH: 28.7 pg (ref 26.0–34.0)
MCHC: 33.7 g/dL (ref 30.0–36.0)
MCV: 85.1 fL (ref 78.0–100.0)
PLATELETS: 210 10*3/uL (ref 150–400)
RBC: 3.35 MIL/uL — AB (ref 4.22–5.81)
RDW: 14.1 % (ref 11.5–15.5)
WBC: 11.8 10*3/uL — AB (ref 4.0–10.5)

## 2016-01-09 LAB — BASIC METABOLIC PANEL
Anion gap: 13 (ref 5–15)
BUN: 34 mg/dL — AB (ref 6–20)
CO2: 25 mmol/L (ref 22–32)
CREATININE: 2.85 mg/dL — AB (ref 0.61–1.24)
Calcium: 7.5 mg/dL — ABNORMAL LOW (ref 8.9–10.3)
Chloride: 98 mmol/L — ABNORMAL LOW (ref 101–111)
GFR, EST AFRICAN AMERICAN: 24 mL/min — AB (ref >60)
GFR, EST NON AFRICAN AMERICAN: 20 mL/min — AB (ref >60)
Glucose, Bld: 359 mg/dL — ABNORMAL HIGH (ref 65–99)
POTASSIUM: 2.5 mmol/L — AB (ref 3.5–5.1)
SODIUM: 136 mmol/L (ref 135–145)

## 2016-01-09 LAB — GLUCOSE, CAPILLARY
GLUCOSE-CAPILLARY: 90 mg/dL (ref 65–99)
GLUCOSE-CAPILLARY: 88 mg/dL (ref 65–99)
GLUCOSE-CAPILLARY: 141 mg/dL — AB (ref 65–99)
GLUCOSE-CAPILLARY: 143 mg/dL — AB (ref 65–99)
Glucose-Capillary: 102 mg/dL — ABNORMAL HIGH (ref 65–99)
Glucose-Capillary: 366 mg/dL — ABNORMAL HIGH (ref 65–99)

## 2016-01-09 MED ORDER — ONDANSETRON HCL 4 MG/2ML IJ SOLN: 4.0000 mg | Freq: Four times a day (QID) | INTRAMUSCULAR | Status: DC | PRN

## 2016-01-09 MED ORDER — ENSURE ENLIVE PO LIQD
237.0000 mL | Freq: Two times a day (BID) | ORAL | Status: DC
  Administered 2016-01-09 – 2016-01-11 (×4): 237 mL via ORAL

## 2016-01-09 MED ORDER — INSULIN ASPART 100 UNIT/ML ~~LOC~~ SOLN
0.0000 [IU] | SUBCUTANEOUS | Status: DC
  Administered 2016-01-09: 1 [IU] via SUBCUTANEOUS
  Administered 2016-01-09: 9 [IU] via SUBCUTANEOUS
  Administered 2016-01-09: 1 [IU] via SUBCUTANEOUS
  Administered 2016-01-10 (×2): 2 [IU] via SUBCUTANEOUS
  Administered 2016-01-11 (×2): 1 [IU] via SUBCUTANEOUS

## 2016-01-09 MED ORDER — ATORVASTATIN CALCIUM 10 MG PO TABS
10.0000 mg | ORAL_TABLET | Freq: Every day | ORAL | Status: DC
  Administered 2016-01-09 – 2016-01-10 (×2): 10 mg via ORAL
  Filled 2016-01-09 (×2): qty 1

## 2016-01-09 MED ORDER — MAGNESIUM SULFATE 2 GM/50ML IV SOLN
2.0000 g | Freq: Once | INTRAVENOUS | Status: AC
  Administered 2016-01-09: 2 g via INTRAVENOUS
  Filled 2016-01-09: qty 50

## 2016-01-09 MED ORDER — ENOXAPARIN SODIUM 30 MG/0.3ML ~~LOC~~ SOLN
30.0000 mg | Freq: Every day | SUBCUTANEOUS | Status: DC
  Administered 2016-01-09 – 2016-01-10 (×3): 30 mg via SUBCUTANEOUS
  Filled 2016-01-09 (×3): qty 0.3

## 2016-01-09 MED ORDER — ONDANSETRON HCL 4 MG PO TABS: 4.0000 mg | ORAL_TABLET | Freq: Four times a day (QID) | ORAL | Status: DC | PRN

## 2016-01-09 MED ORDER — SODIUM CHLORIDE 0.9% FLUSH
3.0000 mL | Freq: Two times a day (BID) | INTRAVENOUS | Status: DC
  Administered 2016-01-11: 3 mL via INTRAVENOUS

## 2016-01-09 MED ORDER — ATENOLOL 50 MG PO TABS
50.0000 mg | ORAL_TABLET | Freq: Every day | ORAL | Status: DC
  Administered 2016-01-09 – 2016-01-11 (×3): 50 mg via ORAL
  Filled 2016-01-09 (×3): qty 1

## 2016-01-09 MED ORDER — DOCUSATE SODIUM 100 MG PO CAPS
100.0000 mg | ORAL_CAPSULE | Freq: Two times a day (BID) | ORAL | Status: DC
  Administered 2016-01-09 – 2016-01-11 (×5): 100 mg via ORAL
  Filled 2016-01-09 (×5): qty 1

## 2016-01-09 MED ORDER — LACTATED RINGERS IV SOLN
INTRAVENOUS | Status: DC
  Administered 2016-01-09 (×2): via INTRAVENOUS

## 2016-01-09 MED ORDER — MAGNESIUM SULFATE 2 GM/50ML IV SOLN: 2.0000 g | Freq: Once | INTRAVENOUS | Status: DC

## 2016-01-09 MED ORDER — PREDNISONE 5 MG PO TABS
5.0000 mg | ORAL_TABLET | Freq: Every day | ORAL | Status: DC
  Administered 2016-01-09 – 2016-01-11 (×3): 5 mg via ORAL
  Filled 2016-01-09 (×3): qty 1

## 2016-01-09 MED ORDER — POTASSIUM CHLORIDE CRYS ER 20 MEQ PO TBCR
40.0000 meq | EXTENDED_RELEASE_TABLET | Freq: Once | ORAL | Status: AC
  Administered 2016-01-09: 40 meq via ORAL
  Filled 2016-01-09: qty 2

## 2016-01-09 MED ORDER — ACETAMINOPHEN 650 MG RE SUPP: 650.0000 mg | Freq: Four times a day (QID) | RECTAL | Status: DC | PRN

## 2016-01-09 MED ORDER — ACETAMINOPHEN 325 MG PO TABS
650.0000 mg | ORAL_TABLET | Freq: Four times a day (QID) | ORAL | Status: DC | PRN
  Administered 2016-01-09: 650 mg via ORAL
  Filled 2016-01-09: qty 2

## 2016-01-09 MED ORDER — DOXYCYCLINE HYCLATE 100 MG PO TABS
100.0000 mg | ORAL_TABLET | Freq: Two times a day (BID) | ORAL | Status: DC
  Administered 2016-01-09 – 2016-01-11 (×4): 100 mg via ORAL
  Filled 2016-01-09 (×4): qty 1

## 2016-01-09 MED ORDER — ABIRATERONE ACETATE 250 MG PO TABS
1000.0000 mg | ORAL_TABLET | Freq: Every day | ORAL | Status: DC
  Administered 2016-01-09 – 2016-01-11 (×3): 1000 mg via ORAL

## 2016-01-09 NOTE — Progress Notes (Signed)
Called by nurse with patient with fever to 102.6.  This is a new finding for him.  No other SIRS criteria present at the time of my evaluation, normal WBC count.   On our previous discussion, he denied any concerns at all that might be associated with infection other than pain in his left wrist with a small amount of erythema and TTP.  U/A is negative.  For now, will order blood cultures x 2, urine culture, and CXR.  No antibiotics for now, but patient is immunocompromised and if he develops sepsis criteria he may need initiation of empiric broad spectrum antibiotics.  Imaging of his wrist could also be considered, although this did not appear to be infectious at the time of my initial evaluation.  Carlyon Shadow, M.D.

## 2016-01-09 NOTE — Progress Notes (Signed)
Referral for meds asst. Patient has pharmacy, co pay reasonable to problems paying. Has pcp. No further CM needs.

## 2016-01-09 NOTE — Care Management Note (Signed)
Case Management Note  Patient Details  Name: RONDA CHIARELLA MRN: [id number] Date of Birth: [birth year]  Subjective/Objective:74 y/o m admitted w/Hyperglycemia. From home.                    Action/Plan:d/c plan home.   Expected Discharge Date:                  Expected Discharge Plan:  Home/Self Care  In-House Referral:     Discharge planning Services  CM Consult  Post Acute Care Choice:    Choice offered to:     DME Arranged:    DME Agency:     HH Arranged:    HH Agency:     Status of Service:  In process, will continue to follow  If discussed at Long Length of Stay Meetings, dates discussed:    Additional Comments:  Dessa Phi, RN 01/09/2016, 1:42 PM

## 2016-01-09 NOTE — Progress Notes (Signed)
LCSW was consulted for medication assistance. LCSW will defer consult to CM who can provide assistance at DC if patient qualifies.  Please re-consult if needs arise.  Lane Hacker, MSW Clinical Social Work: [employer]

## 2016-01-09 NOTE — Progress Notes (Signed)
CRITICAL VALUE ALERT  Critical value received:  K+ 2.5  Date of notification:  01/09/16  Time of notification:  02:20  Critical value read back:Yes.    Nurse who received alert:  Ruben Im, RN  MD notified (1st page):  Value consistent with previous results. Patient receiving K+ runs,   Time of first page:    MD notified (2nd page):  Time of second page:  Responding MD:    Time MD responded:

## 2016-01-09 NOTE — Care Management Obs Status (Signed)
[place] NOTIFICATION   Patient Details  Name: [name] MRN: [id number] Date of Birth: 17-Apr-1940   Medicare Observation Status Notification Given:  Yes    MahabirJuliann Pulse, RN 01/09/2016, 1:42 PM

## 2016-01-09 NOTE — Progress Notes (Addendum)
Initial Nutrition Assessment  DOCUMENTATION CODES:   Severe malnutrition in context of acute illness/injury  INTERVENTION:  - Will order Ensure Enlive po BID, each supplement provides 350 kcal and 20 grams of protein - Encourage PO intakes of meals and supplements. - RD will continue to monitor for additional nutrition-related needs.  NUTRITION DIAGNOSIS:   Inadequate oral intake related to acute illness as evidenced by per patient/family report.  GOAL:   Patient will meet greater than or equal to 90% of their needs  MONITOR:   PO intake, Supplement acceptance, Weight trends, Labs, I & O's  REASON FOR ASSESSMENT:   Malnutrition Screening Tool  ASSESSMENT:   75 y.o. male with medical history significant of castration-resistant metastatic prostate cancer, started taking Zytiga and prednisone on 9/17; DM; h/o DVT, on Coumadin; CKD; HTN; and HLD.  Patient reports that he is just zapped of energy.  His family brought him in because his sugar was high.  [facility] supply of diabetic medicine and so he couldn't afford to take it.  He went without it from Wednesday until now.  Would go to kitchen to make a cup of coffee and would have to sit and drink it there because he didn't have the energy to return to his room.   Meanwhile, has also been taking prednisone.  Pt seen for MST. BMI indicates normal weight. Diet advanced from NPO to Regular at 0940 today. Pt ordered breakfast but reports he did not feel hungry and did not eat the meal. He reports lunch is to arrive at 1430 today. Pt reports that on 9/28 he consumed a small lunch and since that time has only had bites of food or sips of beverages.   Pt denies abdominal pain or nausea today. He states he continues to experience fatigue. Pt reports that fatigue has been ongoing x1 week and that at home he was getting SOB even with minimal exertion. Physical assessment shows mild muscle and fat wasting to upper body; lower body not  assessed at this time; unable to comment on edema at this time. Pt reports that he has been losing weight x4-5 months and that prior to losing weight he had weighed 165 lbs. Per chart review, pt has lost 21 lbs (14% body weight) in the past 5 months which is significant for time frame. Of this, 15 lbs (10% body weight) has been in the past 1 month which is significant for time frame. Pt meets criteria for malnutrition based on weight loss and <50% intakes for >/= 5 days.   He states that he has been without DM medications since 9/27. Pt expresses frustration over inability to obtain medication d/t information he was provided from [facility]. Pt reports he typically has good glycemic control with CBGs around 110 mg/dL. He states CBG has never been above 260 mg/dL except for day of admission when it was 325 mg/dL.  Medications reviewed; sliding scale Novolog, 2 g IV Mg sulfate x1 dose today, 10 mEq IV KCl x6 runs yesterday, 40 mEq oral KCl x1 dose yesterday, 5 mg oral Prednisone/day.  Labs reviewed; CBGs: 88-366 mg/dL since midnight, K: 2.5 mmol/L, Cl: 98 mmol/L, BUN: 34 mg/dL, creatinine: 2.85 mg/dL, Ca: 7.5 mg/dL, Mg: 1.1 mg/dL, GFR: 24 mL/min. IVF: LR @ 150 mL/hr.   ADDENDUM: Pt does not have any teeth. He states that he does not have dentures; he had them in the past but no longer has a set. He reports that he tolerates  most foods well without them and adjusts as needed.    Diet Order:  Diet regular Room service appropriate? Yes; Fluid consistency: Thin  Skin:  Reviewed, no issues  Last BM:  10/1  Height:   Ht Readings from Last 1 Encounters:  01/09/16 5' 8.5" (1.74 m)    Weight:   Wt Readings from Last 1 Encounters:  01/09/16 130 lb 8.2 oz (59.2 kg)    Ideal Body Weight:  71.36 kg  BMI:  Body mass index is 19.56 kg/m.  Estimated Nutritional Needs:   Kcal:  1775-1950 (30-33 grams/kg)  Protein:  75-85 grams  Fluid:  >/= 1.8 L/day  EDUCATION NEEDS:   No education needs  identified at this time   Jarome Matin, MS, RD, LDN Inpatient Clinical Dietitian Pager # [phone] After hours/weekend pager # [phone]

## 2016-01-09 NOTE — Progress Notes (Signed)
Patient ID: [name], male   DOB: May 23, 1940, 75 y.o.   MRN: [id number]    PROGRESS NOTE    [name]  [id number] DOB: 1940/12/24 DOA: 01/08/2016  PCP: [name], FNP   Brief Narrative:  75 y.o. male with medical history significant of castration-resistant metastatic prostate cancer, started taking Zytiga and prednisone on 9/17; DM; h/o DVT, on Coumadin; CKD; HTN; and HLD.  Patient reports that he is just zapped of energy.  His family brought him in because his sugar was high.  [facility] used his supply of diabetic medicine and so he couldn't afford to take it.  He went without it from Wednesday until now.  Would go to kitchen to make a cup of coffee and would have to sit and drink it there because he didn't have the energy to return to his room.   Meanwhile, has also been taking prednisone.  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 - OK to advance diet and monitor CBG's  Hypokalemia - Uncertain etiology other than Lasix - lasix on hold, supplemented, will repeat BMP in AM - also check Mg level with next blood work   AKI on CKD stage III  - Baseline creatinine appears to be ~ 2.36  - Likely related to hypovolemia and hyperglycemia - improving with IVF, diet advanced  - BMP in AM  HTN, essential  - Good control for now - Contine Atenolol  HLD - Continue Lipitor  H/o prostate CA - Metastatic, refractory disease - Recently started on Zytiga and prednisone (reports left wrist pain since starting) - Will continue for now - Will notify Dr. Alen Blew   H/o PE - 2012 - No longer on anticoagulation  Bilateral Wrist pain with fever and new leukocytosis - Meets SIRS criteria - will obtain bilateral wrists  XRAY and place on doxy for now  DVT prophylaxis: Lovenox Code Status: Full  Family Communication: None present Disposition Plan:  Home once clinically improved   Consultants:   None  Procedures:   None  Antimicrobials:   Doxycycline 10/02 -->   Subjective: Still with bilateral wrist pain.  Objective: Vitals:   01/09/16 0242 01/09/16 0453 01/09/16 0754 01/09/16 1411  BP:  126/74  105/60  Pulse:  87  64  Resp:  20  18  Temp: (!) 101.3 F (38.5 C) 100.1 F (37.8 C) 98.5 F (36.9 C) 98.3 F (36.8 C)  TempSrc: Oral Oral Oral Oral  SpO2:  99%  100%  Weight:      Height:        Intake/Output Summary (Last 24 hours) at 01/09/16 1837 Last data filed at 01/09/16 1832  Gross per 24 hour  Intake              905 ml  Output              525 ml  Net              380 ml   Filed Weights   01/08/16 1959 01/09/16 0005  Weight: 65.8 kg (145 lb) 59.2 kg (130 lb 8.2 oz)    Examination:  General exam: Appears calm and comfortable  Respiratory system: Clear to auscultation. Respiratory  effort normal. Cardiovascular system: S1 & S2 heard, RRR. No JVD, murmurs, rubs, gallops or clicks. No pedal edema. Gastrointestinal system: Abdomen is nondistended, soft and nontender. No organomegaly or masses felt. Normal bowel sounds heard. Central nervous system: Alert and oriented. No focal neurological deficits. Extremities: bilateral wrists TTP and with swelling L worse than right wrist, sensation intact to soft touch   Data Reviewed: I have personally reviewed following labs and imaging studies  CBC:  Recent Labs Lab 01/08/16 2052 01/09/16 0150  WBC 8.9 11.8*  HGB 12.2* 9.6*  HCT 36.8* 28.5*  MCV 85.6 85.1  PLT 185 [id number]   Basic Metabolic Panel:  Recent Labs Lab 01/08/16 2052 01/08/16 2145 01/09/16 0150  NA 136  --  136  K 2.1*  --  2.5*  CL 96*  --  98*  CO2 23  --  25  GLUCOSE 424*  --  359*  BUN 37*  --  34*  CREATININE 3.12*  --  2.85*  CALCIUM 8.0*  --   7.5*  MG  --  1.1*  --    GFR: Estimated Creatinine Clearance: 19 mL/min (by C-G formula based on SCr of 2.85 mg/dL (H)). Liver Function Tests:  Recent Labs Lab 01/08/16 2145  AST 29  ALT 11*  ALKPHOS 74  BILITOT 2.2*  PROT 6.3*  ALBUMIN 2.9*    Recent Labs Lab 01/08/16 2145  LIPASE 30   CBG:  Recent Labs Lab 01/09/16 0043 01/09/16 0348 01/09/16 0746 01/09/16 1141 01/09/16 1603  GLUCAP 366* 90 88 102* 141*   Urine analysis:    Component Value Date/Time   COLORURINE YELLOW 01/08/2016 2230   APPEARANCEUR CLOUDY (A) 01/08/2016 2230   LABSPEC 1.011 01/08/2016 2230   PHURINE 5.0 01/08/2016 2230   GLUCOSEU 100 (A) 01/08/2016 2230   GLUCOSEU NEGATIVE 09/22/2014 0757   HGBUR SMALL (A) 01/08/2016 2230   BILIRUBINUR NEGATIVE 01/08/2016 2230   BILIRUBINUR n 04/30/2011 1534   KETONESUR NEGATIVE 01/08/2016 2230   PROTEINUR NEGATIVE 01/08/2016 2230   UROBILINOGEN 0.2 09/22/2014 0757   NITRITE NEGATIVE 01/08/2016 2230   LEUKOCYTESUR NEGATIVE 01/08/2016 2230    Radiology Studies:   Scheduled Meds: . abiraterone Acetate  1,000 mg Oral Daily  . atenolol  50 mg Oral Daily  . atorvastatin  10 mg Oral QPC supper  . docusate sodium  100 mg Oral BID  . doxycycline  100 mg Oral Q12H  . enoxaparin (LOVENOX) injection  30 mg Subcutaneous QHS  . feeding supplement (ENSURE ENLIVE)  237 mL Oral BID BM  . insulin aspart  0-9 Units Subcutaneous Q4H  . predniSONE  5 mg Oral Q breakfast  . sodium chloride flush  3 mL Intravenous Q12H   Continuous Infusions: . lactated ringers 150 mL/hr at 01/09/16 1533     LOS: 0 days    Time spent: 20 minutes    Faye Ramsay, MD [facility] Pager [phone]  If 7PM-7AM, please contact night-coverage [email or web address] Password [facility] 01/09/2016, 6:37 PM

## 2016-01-10 ENCOUNTER — Other Ambulatory Visit: Payer: Self-pay | Admitting: Oncology

## 2016-01-10 DIAGNOSIS — Z8546 Personal history of malignant neoplasm of prostate: Secondary | ICD-10-CM

## 2016-01-10 LAB — GLUCOSE, CAPILLARY
GLUCOSE-CAPILLARY: 190 mg/dL — AB (ref 65–99)
Glucose-Capillary: 189 mg/dL — ABNORMAL HIGH (ref 65–99)
Glucose-Capillary: 106 mg/dL — ABNORMAL HIGH (ref 65–99)
Glucose-Capillary: 79 mg/dL (ref 65–99)
Glucose-Capillary: 135 mg/dL — ABNORMAL HIGH (ref 65–99)
Glucose-Capillary: 84 mg/dL (ref 65–99)

## 2016-01-10 LAB — BASIC METABOLIC PANEL
Anion gap: 8 (ref 5–15)
BUN: 38 mg/dL — AB (ref 6–20)
CALCIUM: 8 mg/dL — AB (ref 8.9–10.3)
CO2: 28 mmol/L (ref 22–32)
CREATININE: 2.76 mg/dL — AB (ref 0.61–1.24)
Chloride: 104 mmol/L (ref 101–111)
GFR calc Af Amer: 24 mL/min — ABNORMAL LOW (ref >60)
GFR, EST NON AFRICAN AMERICAN: 21 mL/min — AB (ref >60)
Glucose, Bld: 146 mg/dL — ABNORMAL HIGH (ref 65–99)
POTASSIUM: 2.8 mmol/L — AB (ref 3.5–5.1)
SODIUM: 140 mmol/L (ref 135–145)

## 2016-01-10 LAB — CBC
HCT: 25.6 % — ABNORMAL LOW (ref 39.0–52.0)
Hemoglobin: 8.5 g/dL — ABNORMAL LOW (ref 13.0–17.0)
MCH: 29.2 pg (ref 26.0–34.0)
MCHC: 33.2 g/dL (ref 30.0–36.0)
MCV: 88 fL (ref 78.0–100.0)
PLATELETS: 205 10*3/uL (ref 150–400)
RBC: 2.91 MIL/uL — AB (ref 4.22–5.81)
RDW: 14.6 % (ref 11.5–15.5)
WBC: 9.6 10*3/uL (ref 4.0–10.5)

## 2016-01-10 LAB — URINE CULTURE

## 2016-01-10 LAB — MAGNESIUM: MAGNESIUM: 1.7 mg/dL (ref 1.7–2.4)

## 2016-01-10 MED ORDER — MAGNESIUM SULFATE 2 GM/50ML IV SOLN
2.0000 g | Freq: Once | INTRAVENOUS | Status: AC
  Administered 2016-01-10: 2 g via INTRAVENOUS
  Filled 2016-01-10: qty 50

## 2016-01-10 MED ORDER — POTASSIUM CHLORIDE CRYS ER 20 MEQ PO TBCR
40.0000 meq | EXTENDED_RELEASE_TABLET | Freq: Two times a day (BID) | ORAL | Status: AC
  Administered 2016-01-10 (×2): 40 meq via ORAL
  Filled 2016-01-10 (×2): qty 2

## 2016-01-10 NOTE — Progress Notes (Signed)
Patient ID: [name], male   DOB: 1941-04-04, 75 y.o.   MRN: [id number]    PROGRESS NOTE    [name]  [id number] DOB: 07/31/1940 DOA: 01/08/2016  PCP: [name], FNP   Brief Narrative:  75 y.o. male with medical history significant of castration-resistant metastatic prostate cancer, started taking Zytiga and prednisone on 9/17; DM; h/o DVT, on Coumadin; CKD; HTN; and HLD.  Patient reports that he is just zapped of energy.  His family brought him in because his sugar was high.  [facility] supply of diabetic medicine and so he couldn't afford to take it.  He went without it from Wednesday until now.  Would go to kitchen to make a cup of coffee and would have to sit and drink it there because he didn't have the energy to return to his room.   Meanwhile, has also been taking prednisone.  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 - diet advanced but pt still with poor oral intake   Hypokalemia, hypomg - suspect chemo tx - continue to supplement and repeat BMP in AM - also check Mg level with next blood work   AKI on CKD stage III  - Baseline creatinine appears to be ~ 2.36  - Likely related to hypovolemia and hyperglycemia - improving with IVF, diet advanced  - BMP in AM  HTN, essential  - Good control for now - Contine Atenolol  HLD - Continue Lipitor  H/o prostate CA - Metastatic, refractory disease - Recently started on Zytiga and prednisone (reports left wrist pain since starting) - Will continue for now - Will notify Dr. Alen Blew via EPIC order set   H/o PE - 2012 - No longer on anticoagulation  Bilateral Wrist pain with fever and new leukocytosis - Meets SIRS criteria, started on doxy and  today is day #2 - left wrist appears to be not only swollen but warm to touch and TTP suspected cellulitis - WBC is now WNL and pt with no fever, left wrist looks better this AM, less erythema and less TTP - spoke with Dr. Percell Miller on call, about xray with ? Fracture left wrist, he said it does look like it, splint recommended for now and outpatient follow up in his office in next week - order for splint placed - also placed order for PT/OT  Anemia of chronic disease, malignancy - no signs of active bleeding but Hg down - monitor closely  - CBC in AM  DVT prophylaxis: Lovenox Code Status: Full  Family Communication: wife at bedside  Disposition Plan:  Home once clinically improved and electrolytes stable    Consultants:   Dr. Percell Miller over the phone   Procedures:   None  Antimicrobials:   Doxycycline 10/02 -->  Subjective: Still with bilateral wrist pain but overall better.   Objective: Vitals:   01/09/16 1411 01/09/16 2030 01/10/16 0621 01/10/16 1300  BP: 105/60 124/73 98/62 109/61  Pulse: 64 61 72 99  Resp: 18 18 16 18   Temp: 98.3 F (36.8 C) 99.8 F (37.7 C) 99.6 F (37.6 C) 98.2 F (36.8 C)  TempSrc: Oral Oral Oral Oral  SpO2: 100% 100% 100% 100%  Weight:  Height:        Intake/Output Summary (Last 24 hours) at 01/10/16 1644 Last data filed at 01/10/16 1308  Gross per 24 hour  Intake                0 ml  Output              700 ml  Net             -700 ml   Filed Weights   01/08/16 1959 01/09/16 0005  Weight: 65.8 kg (145 lb) 59.2 kg (130 lb 8.2 oz)    Examination:  General exam: Appears calm and comfortable  Respiratory system: Clear to auscultation. Respiratory effort normal. Cardiovascular system: S1 & S2 heard, RRR. No JVD, murmurs, rubs, gallops or clicks. No pedal edema. Gastrointestinal system: Abdomen is nondistended, soft and nontender. No organomegaly or masses felt. Normal bowel sounds heard. Central nervous system: Alert and  oriented. No focal neurological deficits. Extremities: bilateral wrists TTP and with swelling L worse than right wrist, overall better, sensation intact to soft touch   Data Reviewed: I have personally reviewed following labs and imaging studies  CBC:  Recent Labs Lab 01/08/16 2052 01/09/16 0150 01/10/16 0517  WBC 8.9 11.8* 9.6  HGB 12.2* 9.6* 8.5*  HCT 36.8* 28.5* 25.6*  MCV 85.6 85.1 88.0  PLT 185 210 [id number]   Basic Metabolic Panel:  Recent Labs Lab 01/08/16 2052 01/08/16 2145 01/09/16 0150 01/10/16 0517  NA 136  --  136 140  K 2.1*  --  2.5* 2.8*  CL 96*  --  98* 104  CO2 23  --  25 28  GLUCOSE 424*  --  359* 146*  BUN 37*  --  34* 38*  CREATININE 3.12*  --  2.85* 2.76*  CALCIUM 8.0*  --  7.5* 8.0*  MG  --  1.1*  --  1.7   Liver Function Tests:  Recent Labs Lab 01/08/16 2145  AST 29  ALT 11*  ALKPHOS 74  BILITOT 2.2*  PROT 6.3*  ALBUMIN 2.9*    Recent Labs Lab 01/08/16 2145  LIPASE 30   CBG:  Recent Labs Lab 01/09/16 2018 01/10/16 0333 01/10/16 0620 01/10/16 0758 01/10/16 1153  GLUCAP 143* 189* 106* 79 135*   Urine analysis:    Component Value Date/Time   COLORURINE YELLOW 01/08/2016 2230   APPEARANCEUR CLOUDY (A) 01/08/2016 2230   LABSPEC 1.011 01/08/2016 2230   PHURINE 5.0 01/08/2016 2230   GLUCOSEU 100 (A) 01/08/2016 2230   GLUCOSEU NEGATIVE 09/22/2014 0757   HGBUR SMALL (A) 01/08/2016 2230   BILIRUBINUR NEGATIVE 01/08/2016 2230   BILIRUBINUR n 04/30/2011 1534   KETONESUR NEGATIVE 01/08/2016 2230   PROTEINUR NEGATIVE 01/08/2016 2230   UROBILINOGEN 0.2 09/22/2014 0757   NITRITE NEGATIVE 01/08/2016 2230   LEUKOCYTESUR NEGATIVE 01/08/2016 2230    Radiology Studies:   Scheduled Meds: . abiraterone Acetate  1,000 mg Oral Daily  . atenolol  50 mg Oral Daily  . atorvastatin  10 mg Oral QPC supper  . docusate sodium  100 mg Oral BID  . doxycycline  100 mg Oral Q12H  . enoxaparin (LOVENOX) injection  30 mg Subcutaneous QHS  .  feeding supplement (ENSURE ENLIVE)  237 mL Oral BID BM  . insulin aspart  0-9 Units Subcutaneous Q4H  . magnesium sulfate 1 - 4 g bolus IVPB  2 g Intravenous Once  . potassium chloride  40 mEq Oral BID  . predniSONE  5 mg  Oral Q breakfast  . sodium chloride flush  3 mL Intravenous Q12H   Continuous Infusions:     LOS: 1 day    Time spent: 20 minutes    Faye Ramsay, MD [facility] Pager [phone]  If 7PM-7AM, please contact night-coverage [email or web address] Password [email or web address] 01/10/2016, 4:44 PM

## 2016-01-10 NOTE — Care Management Note (Signed)
Case Management Note  Patient Details  Name: [name] MRN: [id number] Date of Birth: 1940-10-10  Subjective/Objective: Noted wrist pain-ortho cons- await recc.From home.                   Action/Plan:d/c plan home.   Expected Discharge Date:                  Expected Discharge Plan:  Home/Self Care  In-House Referral:     Discharge planning Services  CM Consult  Post Acute Care Choice:    Choice offered to:     DME Arranged:    DME Agency:     HH Arranged:    HH Agency:     Status of Service:  In process, will continue to follow  If discussed at Long Length of Stay Meetings, dates discussed:    Additional Comments:  Dessa Phi, RN 01/10/2016, 11:19 AM

## 2016-01-11 ENCOUNTER — Other Ambulatory Visit: Payer: Medicare Other

## 2016-01-11 ENCOUNTER — Ambulatory Visit: Payer: Medicare Other | Admitting: Oncology

## 2016-01-11 DIAGNOSIS — Z8546 Personal history of malignant neoplasm of prostate: Secondary | ICD-10-CM

## 2016-01-11 DIAGNOSIS — E876 Hypokalemia: Secondary | ICD-10-CM

## 2016-01-11 DIAGNOSIS — E119 Type 2 diabetes mellitus without complications: Secondary | ICD-10-CM

## 2016-01-11 DIAGNOSIS — N189 Chronic kidney disease, unspecified: Secondary | ICD-10-CM

## 2016-01-11 DIAGNOSIS — I10 Essential (primary) hypertension: Secondary | ICD-10-CM

## 2016-01-11 DIAGNOSIS — E43 Unspecified severe protein-calorie malnutrition: Secondary | ICD-10-CM

## 2016-01-11 DIAGNOSIS — N179 Acute kidney failure, unspecified: Secondary | ICD-10-CM

## 2016-01-11 DIAGNOSIS — E860 Dehydration: Secondary | ICD-10-CM

## 2016-01-11 LAB — CBC
HCT: 24.9 % — ABNORMAL LOW (ref 39.0–52.0)
Hemoglobin: 8.3 g/dL — ABNORMAL LOW (ref 13.0–17.0)
MCH: 28.6 pg (ref 26.0–34.0)
MCHC: 33.3 g/dL (ref 30.0–36.0)
MCV: 85.9 fL (ref 78.0–100.0)
PLATELETS: 225 10*3/uL (ref 150–400)
RBC: 2.9 MIL/uL — ABNORMAL LOW (ref 4.22–5.81)
RDW: 14.4 % (ref 11.5–15.5)
WBC: 8.6 10*3/uL (ref 4.0–10.5)

## 2016-01-11 LAB — BASIC METABOLIC PANEL
Anion gap: 8 (ref 5–15)
BUN: 42 mg/dL — AB (ref 6–20)
CALCIUM: 8.5 mg/dL — AB (ref 8.9–10.3)
CO2: 29 mmol/L (ref 22–32)
CREATININE: 2.9 mg/dL — AB (ref 0.61–1.24)
Chloride: 104 mmol/L (ref 101–111)
GFR calc Af Amer: 23 mL/min — ABNORMAL LOW (ref >60)
GFR, EST NON AFRICAN AMERICAN: 20 mL/min — AB (ref >60)
Glucose, Bld: 84 mg/dL (ref 65–99)
Potassium: 3 mmol/L — ABNORMAL LOW (ref 3.5–5.1)
SODIUM: 141 mmol/L (ref 135–145)

## 2016-01-11 LAB — GLUCOSE, CAPILLARY
Glucose-Capillary: 109 mg/dL — ABNORMAL HIGH (ref 65–99)
Glucose-Capillary: 134 mg/dL — ABNORMAL HIGH (ref 65–99)
Glucose-Capillary: 85 mg/dL (ref 65–99)
Glucose-Capillary: 145 mg/dL — ABNORMAL HIGH (ref 65–99)

## 2016-01-11 LAB — MAGNESIUM: MAGNESIUM: 2.2 mg/dL (ref 1.7–2.4)

## 2016-01-11 MED ORDER — POTASSIUM CHLORIDE CRYS ER 20 MEQ PO TBCR
40.0000 meq | EXTENDED_RELEASE_TABLET | Freq: Every day | ORAL | Status: DC
  Administered 2016-01-11: 40 meq via ORAL
  Filled 2016-01-11: qty 2

## 2016-01-11 MED ORDER — FUROSEMIDE 40 MG PO TABS: 40.0000 mg | ORAL_TABLET | Freq: Every day | ORAL | 1 refills | Status: DC

## 2016-01-11 MED ORDER — DOXYCYCLINE HYCLATE 100 MG PO TABS: 100.0000 mg | ORAL_TABLET | Freq: Two times a day (BID) | ORAL | 0 refills | Status: DC

## 2016-01-11 NOTE — Evaluation (Signed)
Physical Therapy Evaluation Patient Details Name: [name] MRN: [id number] DOB: 27-Mar-1941 Today's Date: 01/11/2016   History of Present Illness  Pt is a 75 y/o male with PMHx of prostate cancer, HTN, CKD, DVT, HLD, DM, and recent L wrist fx admitted with hyperglycemia.  Clinical Impression  Pt evaluation by Physical Therapy with no further acute PT needs identified. All education has been completed and the patient has no further questions. See below for any follow-up PT or equipment needs. PT is signing off. Thank you for this referral. Pt presented with L wrist fx (wearing brace), assumed NWB status for safety. Pt was able to perform all mobility with supervision and reported that weakness is his only issue at this time. Recommended home health PT for endurance and strength, but pt declined stating that he has family members and friends with medical background that will be available upon discharge to help him regain his strength and stamina.    Follow Up Recommendations No PT follow up (suggested home health PT due to weakness, pt denied)    Equipment Recommendations  None recommended by PT    Recommendations for Other Services       Precautions / Restrictions Precautions Precautions: Fall Precaution Comments: assumed and maintained NWB, no orders Required Braces or Orthoses: Other Brace/Splint Other Brace/Splint: L wrist Restrictions Weight Bearing Restrictions: No      Mobility  Bed Mobility Overal bed mobility: Independent                Transfers Overall transfer level: Needs assistance Equipment used: None Transfers: Sit to/from Stand Sit to Stand: Supervision         General transfer comment: supervision for safety as pt reports weakness  Ambulation/Gait Ambulation/Gait assistance: Min guard;Supervision Ambulation Distance (Feet): 160 Feet Assistive device: None Gait Pattern/deviations: Step-through pattern;WFL(Within Functional Limits)      General Gait Details: initially guarding for safety as pt reports weakness; overall supervision level  Stairs            Wheelchair Mobility    Modified Rankin (Stroke Patients Only)       Balance Overall balance assessment: No apparent balance deficits (not formally assessed) (pt reports 1 fall in past year, not due to balance deficits)                                           Pertinent Vitals/Pain Pain Assessment: No/denies pain    Home Living Family/patient expects to be discharged to:: Private residence Living Arrangements: Spouse/significant other Available Help at Discharge: Family;Available PRN/intermittently Type of Home: House Home Access: Stairs to enter Entrance Stairs-Rails: None Entrance Stairs-Number of Steps: 2 Home Layout: One level Home Equipment: Cane - single point Additional Comments: pt reports having spouse and other relatives (with medical background) available at d/c to help pt regain his strength    Prior Function Level of Independence: Independent               Hand Dominance        Extremity/Trunk Assessment               Lower Extremity Assessment: Generalized weakness         Communication   Communication: No difficulties  Cognition Arousal/Alertness: Awake/alert Behavior During Therapy: WFL for tasks assessed/performed Overall Cognitive Status: Within Functional Limits for tasks assessed  General Comments      Exercises     Assessment/Plan    PT Assessment Patent does not need any further PT services  PT Problem List            PT Treatment Interventions      PT Goals (Current goals can be found in the Care Plan section)  Acute Rehab PT Goals PT Goal Formulation: All assessment and education complete, DC therapy    Frequency     Barriers to discharge        Co-evaluation               End of Session Equipment Utilized During Treatment:  Gait belt Activity Tolerance: Patient tolerated treatment well Patient left: in chair;with call bell/phone within reach;with chair alarm set;with family/visitor present Nurse Communication: Mobility status         Time: 1041-1105 PT Time Calculation (min) (ACUTE ONLY): 24 min   Charges:   PT Evaluation $PT Eval Low Complexity: 1 Procedure     PT G CodesDewitt Hoes 2016/01/27, 2:25 PM Dewitt Hoes, SPT

## 2016-01-11 NOTE — Discharge Summary (Signed)
Physician Discharge Summary  [name] CBU:[id number] DOB: 22-Nov-1940 DOA: 01/08/2016  PCP: [name], FNP  Admit date: 01/08/2016 Discharge date: 01/11/2016  Time spent: 35 minutes  Recommendations for Outpatient Follow-up:  Close follow up to patient CBG's and diabetes; adjust his hypoglycemic regimen as needed  Outpatient follow up with Dr. Percell Miller for further evaluation and treatment on his left wrist fracture/pain. Repeat BMET to follow electrolytes and renal function  Repeat CBC to follow Hgb trend    Discharge Diagnoses:  Principal Problem:   Hyperglycemia Active Problems:   Diabetes mellitus type 2 in nonobese (HCC)   Hyperlipidemia associated with type 2 diabetes mellitus ([place])   Essential hypertension   PROSTATE CANCER, HX OF   Pulmonary embolus (HCC)   Hypokalemia   Acute kidney injury superimposed on chronic kidney disease (HCC)   Prolonged Q-T interval on ECG   Protein-calorie malnutrition, severe   Dehydration   Hypomagnesemia   Discharge Condition: stable and improved. Patient discharge home with instructions to follow up with PCP in 10 days; will also follow up with oncology service and with orthopedic service at discharge (see below for details)  Diet recommendation: heart healthy diet and low carbohydrates   Filed Weights   01/08/16 1959 01/09/16 0005  Weight: 65.8 kg (145 lb) 59.2 kg (130 lb 8.2 oz)    History of present illness:  As per dr. Lorin Mercy H&P written on 01/08/16 75 y.o. male with medical history significant of castration-resistant metastatic prostate cancer, started taking Zytiga and prednisone on 9/17; DM; h/o DVT, on Coumadin; CKD; HTN; and HLD.  Patient reports that he is just zapped of energy.  His family brought him in because his sugar was high.  [facility] said [facility] had used his supply of diabetic medicine and so he couldn't afford to take it.  He went without it from Wednesday until now.  Would go to kitchen to make a cup of coffee and  would have to sit and drink it there because he didn't have the energy to return to his room.   Meanwhile, has also been taking prednisone.  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 -close follow up with PCP advised.    Hypokalemia, hypomg - suspect associated with chemotherapy, decrease PO intake and hyperdiuresis with hyperglycemic state  -electrolytes repleted and patient discharge on maintenance supplementation   AKI on CKD stage III  - Baseline creatinine appears to be ~ 2.36  - Likely related to pre-renal azotemia due to dehydration from hypovolemia and hyperglycemia - improved and essentially back to his baseline with IVF -advise to keep himself well hydrated and to minimize/avoid excessive carbohydrates intake - BMP as an outpatient recommended   HTN, essential  - Good control for now - Contine home antihypertensive regimen  -advise to follow low sodium diet  HLD - Continue Lipitor  H/o prostate CA - Metastatic, refractory disease - Recently started on Zytiga and prednisone  -  Will continue Zytiga only as per oncology rec's   H/o PE - this was diagnosed in 2012 - No longer on anticoagulation -no SOB and no CP -patient also informed me that he will hesitate to use any further anticoagulation sin the future   Bilateral Wrist pain with fever and new leukocytosis - Met SIRS  criteria, started on doxy for concomitant cellulitis  - left wrist appears to be not only swollen but warm to touch and TTP  - WBC WNL at discharge and pt with no fever, left wrist looks better, less erythema and less TTP - spoke with Dr. Percell Miller on call, about xray with ? Fracture left wrist, he said it does look like it, splint recommended for now and outpatient follow up in his office next week - splint placed at discharge - patient reporting falling on his hands about 4 months ago; never seek attention   Anemia of chronic disease, malignancy - no signs of active bleeding but Hg down - will recommend outpatient CBC to monitor closely Hgb trend   Procedures:  See below for x-ray reports   Consultations:  Oncology service   Orthopedic service (Dr. Percell Miller by phone, curbside by Dr. Doyle Askew)  Discharge Exam: Vitals:   01/11/16 0415 01/11/16 1245  BP: 116/70 121/72  Pulse: 72 75  Resp: 20 18  Temp: 98.2 F (36.8 C) 98.5 F (36.9 C)   General exam: Appears calm and comfortable  Respiratory system: Clear to auscultation. Respiratory effort normal. Cardiovascular system: S1 & S2 heard, RRR. No JVD, murmurs, rubs, gallops or clicks. No pedal edema. Gastrointestinal system: Abdomen is nondistended, soft and nontender. No organomegaly or masses felt. Normal bowel sounds heard. Central nervous system: Alert and oriented. No focal neurological deficits. Extremities: left wrist swollen and tender to palpation; splint in place at discharge; minor discomfort on his right wrist. Erythematous changes essentially resolved.   Discharge Instructions   Discharge Instructions    Diet - low sodium heart healthy    Complete by:  As directed    Discharge instructions    Complete by:  As directed    Take medications as prescribed  Please follow heart healthy diet  Be compliant with medications Minimize/avoid sweet tea and sodas Check weight on daily bases Follow up with PCP in 10  days Follow up with Dr. Jamelle Haring next week (call office for appointment details) Follow up with Oncology service as instructed (Dr. Royetta Car office will contact you with appointment details)     Current Discharge Medication List    START taking these medications   Details  doxycycline (VIBRA-TABS) 100 MG tablet Take 1 tablet (100 mg total) by mouth every 12 (twelve) hours. Qty: 10 tablet, Refills: 0      CONTINUE these medications which have CHANGED   Details  furosemide (LASIX) 40 MG tablet Take 1 tablet (40 mg total) by mouth daily. Qty: 30 tablet, Refills: 1      CONTINUE these medications which have NOT CHANGED   Details  atenolol (TENORMIN) 100 MG tablet Take 50 mg by mouth daily.     atorvastatin (LIPITOR) 10 MG tablet Take 1 tablet (10 mg total) by mouth daily after supper. Qty: 90 tablet, Refills: 0    GLIPIZIDE XL 5 MG 24 hr tablet TAKE 1 TABLET BY MOUTH DAILY WITH BREAKFAST. Qty: 90 tablet, Refills: 0    ONE TOUCH ULTRA TEST test strip USE TO TEST ONCE DAILY Qty: 50 each, Refills: [place]  33G MISC USE TO TEST ONCE DAILY Dx code E11.65 Qty: 100 each, Refills: 0    pioglitazone (ACTOS) 30 MG tablet TAKE 1 TABLET BY MOUTH EVERY DAY Qty: 90 tablet, Refills: 0    potassium chloride SA (KLOR-CON M20) 20 MEQ tablet Take 1 tablet (20 mEq total) by mouth daily. Qty: 90 tablet, Refills: 1    TRADJENTA 5 MG TABS tablet TAKE 1 TABLET (5 MG TOTAL) BY MOUTH DAILY. Qty: 90 tablet, Refills: 0    ZYTIGA 250 MG tablet TAKE 4 TABLETS (1,000MG) BY MOUTH EVERY DAY ON AN EMPTY STOMACH 1 HOURBEFORE OR 2 HOURS AFTER A MEAL Qty: 120 tablet, Refills: 0   Associated Diagnoses: Personal history of malignant neoplasm of prostate      STOP taking these medications     predniSONE (DELTASONE) 5 MG tablet        No Known Allergies Follow-up Information    MURPHY, TIMOTHY D, MD. Schedule an appointment as soon as possible for a visit in 1 week(s).   Specialty:   Orthopedic Surgery Why:  call office for appointment details  Contact information: [address] [place] [address] [phone]        SHADAD,FIRAS, MD Follow up today.   Specialty:  Oncology Why:  office will contcat you with appointment details  Contact information: [place]. [place] [address] [phone]        [name], FNP. Schedule an appointment as soon as possible for a visit in 10 day(s).   Specialty:  Family Medicine Contact information: [place] [address] [phone]            The results of significant diagnostics from this hospitalization (including imaging, microbiology, ancillary and laboratory) are listed below for reference.    Significant Diagnostic Studies: Dg Chest 2 View  Result Date: 01/09/2016 CLINICAL DATA:  Fevers and hyperglycemia EXAM: CHEST  2 VIEW COMPARISON:  11/17/2015 FINDINGS: Cardiac shadow is within normal limits. The lungs are well aerated bilaterally. Patchy changes seen on prior PET-CT are not well appreciated on this exam. No acute bony abnormality is noted. IMPRESSION: No active cardiopulmonary disease. Electronically Signed   By: Inez Catalina M.D.   On: 01/09/2016 11:14   Dg Wrist Complete Left  Result Date: 01/09/2016 CLINICAL DATA:  Bilateral arm and hand weakness, fall, initial encounter. EXAM: LEFT WRIST - COMPLETE 3+ VIEW COMPARISON:  None. FINDINGS: Slight irregularity is seen along the dorsal aspect of the wrist on the lateral view, with soft tissue swelling. Irregularity appears slightly lower than expected for a triquetral avulsion fracture but it is not excluded. Otherwise, no acute osseous abnormality. Degenerative changes are seen in the wrist, including chondrocalcinosis. IMPRESSION: 1. Slight irregularity along the dorsal aspect of the carpal bones. Difficult to definitively exclude a triquetral avulsion fracture. Please see discussion above. 2. Degenerative  changes in the wrist. Electronically Signed   By: Lorin Picket M.D.   On: 01/09/2016 11:08   Dg Wrist Complete Right  Result Date: 01/09/2016 CLINICAL DATA:  Wrist pain EXAM: RIGHT WRIST - COMPLETE 3+ VIEW COMPARISON:  None. FINDINGS: No distal radius or ulnar fracture. Radiocarpal joint is intact. No carpal fracture. No soft tissue abnormality. IMPRESSION: No fracture or dislocation. Electronically Signed   By: Suzy Bouchard M.D.   On: 01/09/2016 11:15   Dg Hand Complete Left  Result Date: 01/09/2016 CLINICAL DATA:  Swelling. EXAM: LEFT HAND - COMPLETE 3+ VIEW COMPARISON:  01/09/2016. FINDINGS:  Diffuse osteopenia degenerative change. No acute abnormality identified. IMPRESSION: Diffuse osteopenia and degenerative change. No acute abnormality identified. Electronically Signed   By: Marcello Moores  Register   On: 01/09/2016 11:05   Dg Hand Complete Right  Result Date: 01/09/2016 CLINICAL DATA:  Weakness. EXAM: RIGHT HAND - COMPLETE 3+ VIEW COMPARISON:  No recent prior. FINDINGS: Diffuse osteopenia. No acute bony abnormality identified. Diffuse degenerative change. No evidence of fracture dislocation. IMPRESSION: Diffuse osteopenia and degenerative change. No acute bony abnormality identified. Electronically Signed   By: Marcello Moores  Register   On: 01/09/2016 11:07    Microbiology: Recent Results (from the past 240 hour(s))  Culture, blood (routine x 2)     Status: None (Preliminary result)   Collection Time: 01/09/16  1:14 AM  Result Value Ref Range Status   Specimen Description BLOOD RIGHT ANTECUBITAL  Final   Special Requests IN PEDIATRIC BOTTLE [place]  Final   Culture   Final    NO GROWTH 1 DAY Performed at [facility]    Report Status PENDING  Incomplete  Culture, blood (routine x 2)     Status: None (Preliminary result)   Collection Time: 01/09/16  1:14 AM  Result Value Ref Range Status   Specimen Description BLOOD LEFT FOREARM  Final   Special Requests IN PEDIATRIC BOTTLE [place]  Final    Culture   Final    NO GROWTH 1 DAY Performed at [facility]    Report Status PENDING  Incomplete  Culture, Urine     Status: Abnormal   Collection Time: 01/09/16  1:17 AM  Result Value Ref Range Status   Specimen Description URINE, RANDOM  Final   Special Requests NONE  Final   Culture MULTIPLE SPECIES PRESENT, SUGGEST RECOLLECTION (A)  Final   Report Status 01/10/2016 FINAL  Final     Labs: Basic Metabolic Panel:  Recent Labs Lab 01/08/16 2052 01/08/16 2145 01/09/16 0150 01/10/16 0517 01/11/16 0535  NA 136  --  136 140 141  K 2.1*  --  2.5* 2.8* 3.0*  CL 96*  --  98* 104 104  CO2 23  --  _0 GLUCOSE 424*  --  359* 146* 84  BUN 37*  --  34* 38* 42*  CREATININE 3.12*  --  2.85* 2.76* 2.90*  CALCIUM 8.0*  --  7.5* 8.0* 8.5*  MG  --  1.1*  --  1.7 2.2   Liver Function Tests:  Recent Labs Lab 01/08/16 2145  AST 29  ALT 11*  ALKPHOS 74  BILITOT 2.2*  PROT 6.3*  ALBUMIN 2.9*    Recent Labs Lab 01/08/16 2145  LIPASE 30   No results for input(s): AMMONIA in the last 168 hours. CBC:  Recent Labs Lab 01/08/16 2052 01/09/16 0150 01/10/16 0517 01/11/16 0535  WBC 8.9 11.8* 9.6 8.6  HGB 12.2* 9.6* 8.5* 8.3*  HCT 36.8* 28.5* 25.6* 24.9*  MCV 85.6 85.1 88.0 85.9  PLT 185 210 205 225   Cardiac Enzymes: No results for input(s): CKTOTAL, CKMB, CKMBINDEX, TROPONINI in the last 168 hours. BNP: BNP (last 3 results) No results for input(s): BNP in the last 8760 hours.  ProBNP (last 3 results) No results for input(s): PROBNP in the last 8760 hours.  CBG:  Recent Labs Lab 01/10/16 2051 01/11/16 0023 01/11/16 0412 01/11/16 0727 01/11/16 1124  GLUCAP 84 134* 109* 85 145*       Signed:  Barton Dubois MD.  [facility] 01/11/2016, 2:39 PM

## 2016-01-11 NOTE — Progress Notes (Signed)
OT Cancellation Note  Patient Details Name: [name] MRN: [id number] DOB: Dec 11, 1940   Cancelled Treatment:    Reason Eval/Treat Not Completed: Other (comment).  Pt was seen by PT and moved at supervision level. He states that he does not need OT.  He has as much supervision as he needs at home and doesn't need DME (per pt). Will sign off.  Bertina Guthridge 01/11/2016, 1:52 PM  Lesle Chris, OTR/L [phone] 01/11/2016

## 2016-01-11 NOTE — Progress Notes (Signed)
Events noted. Patient hospitalized with hyperglycemia after starting Zytiga and prednisone. He also reports he ran out of his diabetic medication which might have exacerbated his symptoms. Clinically he appears to be improving.  I recommend resuming Zytiga without prednisone moving forward. I will arrange follow-up for him from an oncology standpoint upon discharge.

## 2016-01-14 LAB — CULTURE, BLOOD (ROUTINE X 2)
CULTURE: NO GROWTH
Culture: NO GROWTH

## 2016-01-18 DIAGNOSIS — J189 Pneumonia, unspecified organism: Secondary | ICD-10-CM

## 2016-01-18 HISTORY — DX: Pneumonia, unspecified organism: J18.9

## 2016-01-20 DIAGNOSIS — M25532 Pain in left wrist: Secondary | ICD-10-CM | POA: Diagnosis not present

## 2016-01-23 ENCOUNTER — Telehealth: Payer: Self-pay | Admitting: Oncology

## 2016-01-23 NOTE — Telephone Encounter (Signed)
10/04 Appointment rescheduled to 10/18 per patient request. The patient was hospitalized on 10/04

## 2016-01-25 ENCOUNTER — Other Ambulatory Visit (HOSPITAL_BASED_OUTPATIENT_CLINIC_OR_DEPARTMENT_OTHER): Payer: Medicare Other

## 2016-01-25 ENCOUNTER — Telehealth: Payer: Self-pay | Admitting: Oncology

## 2016-01-25 ENCOUNTER — Ambulatory Visit (HOSPITAL_BASED_OUTPATIENT_CLINIC_OR_DEPARTMENT_OTHER): Payer: Medicare Other | Admitting: Oncology

## 2016-01-25 VITALS — BP 105/61 | HR 79 | Temp 98.2°F | Resp 18 | Ht 68.5 in | Wt 142.0 lb

## 2016-01-25 DIAGNOSIS — C785 Secondary malignant neoplasm of large intestine and rectum: Secondary | ICD-10-CM | POA: Diagnosis not present

## 2016-01-25 DIAGNOSIS — C61 Malignant neoplasm of prostate: Secondary | ICD-10-CM | POA: Diagnosis not present

## 2016-01-25 DIAGNOSIS — Z8546 Personal history of malignant neoplasm of prostate: Secondary | ICD-10-CM

## 2016-01-25 DIAGNOSIS — E291 Testicular hypofunction: Secondary | ICD-10-CM

## 2016-01-25 DIAGNOSIS — E119 Type 2 diabetes mellitus without complications: Secondary | ICD-10-CM

## 2016-01-25 DIAGNOSIS — E876 Hypokalemia: Secondary | ICD-10-CM | POA: Diagnosis not present

## 2016-01-25 LAB — COMPREHENSIVE METABOLIC PANEL
ALK PHOS: 140 U/L (ref 40–150)
ALT: 29 U/L (ref 0–55)
ANION GAP: 12 meq/L — AB (ref 3–11)
AST: 65 U/L — ABNORMAL HIGH (ref 5–34)
Albumin: 2.7 g/dL — ABNORMAL LOW (ref 3.5–5.0)
BILIRUBIN TOTAL: 0.53 mg/dL (ref 0.20–1.20)
BUN: 23 mg/dL (ref 7.0–26.0)
CO2: 27 meq/L (ref 22–29)
Calcium: 9 mg/dL (ref 8.4–10.4)
Chloride: 106 mEq/L (ref 98–109)
Creatinine: 2.8 mg/dL — ABNORMAL HIGH (ref 0.7–1.3)
EGFR: 25 mL/min/{1.73_m2} — AB (ref >90)
Glucose: 159 mg/dl — ABNORMAL HIGH (ref 70–140)
Potassium: 3.2 mEq/L — ABNORMAL LOW (ref 3.5–5.1)
Sodium: 146 mEq/L — ABNORMAL HIGH (ref 136–145)
TOTAL PROTEIN: 6.3 g/dL — AB (ref 6.4–8.3)

## 2016-01-25 LAB — CBC WITH DIFFERENTIAL/PLATELET
BASO%: 0.5 % (ref 0.0–2.0)
BASOS ABS: 0 10*3/uL (ref 0.0–0.1)
EOS ABS: 0.1 10*3/uL (ref 0.0–0.5)
EOS%: 1.1 % (ref 0.0–7.0)
HCT: 29.2 % — ABNORMAL LOW (ref 38.4–49.9)
HGB: 9.2 g/dL — ABNORMAL LOW (ref 13.0–17.1)
LYMPH%: 14.8 % (ref 14.0–49.0)
MCH: 28.5 pg (ref 27.2–33.4)
MCHC: 31.5 g/dL — ABNORMAL LOW (ref 32.0–36.0)
MCV: 90.4 fL (ref 79.3–98.0)
MONO#: 0.5 10*3/uL (ref 0.1–0.9)
MONO%: 5.5 % (ref 0.0–14.0)
NEUT%: 78.1 % — AB (ref 39.0–75.0)
NEUTROS ABS: 6.8 10*3/uL — AB (ref 1.5–6.5)
PLATELETS: 279 10*3/uL (ref 140–400)
RBC: 3.23 10*6/uL — AB (ref 4.20–5.82)
RDW: 14.5 % (ref 11.0–14.6)
WBC: 8.7 10*3/uL (ref 4.0–10.3)
lymph#: 1.3 10*3/uL (ref 0.9–3.3)

## 2016-01-25 NOTE — Progress Notes (Signed)
Hematology and Oncology Follow Up Visit  [name] [id number] [birth year] 75 y.o. 01/25/2016 10:24 AM [name], FNPWebb, Kevin Lolling, FNP   Principle Diagnosis: 76 year old gentleman with prostate cancer diagnosed in 2006. He had a Gleason score 4+4 = 8.   Prior Therapy: He was treated with cryotherapy and a PSA nadir was around 0.4 in June 2017.  His PSA started to rise and a repeat biopsy in 2008 showed no active cancer detected. His staging workup did not reveal any evidence of metastatic disease at that time including CT scan and bone scan. His PSA continues to rise and was a 7.75 in June 2009. He was treated with androgen deprivation under the care of Dr. Gaynelle Arabian initially with Degarelix and subsequently with Vantas.  His PSA in 2011 was as well as 0.18. His PSA started to rise again in 2016 up to 19.57 in April 2016. In April 2017 was 31.55 and on 10/20/2015 was 43.6. Staging workup including CT scan and a bone scan did not reveal any clear-cut metastatic disease.   Imaging obtained on 11/17/2015 utilizing F-18 Fluciclovine PET was interpreted by Dr. Leonia Reeves from radiology and showed 2 soft tissue masses in the deep pelvis adjacent to the rectum with intense amino acid radiotracer uptake consistent with prostate cancer recurrence. These masses measuring 2.8 x 2.5 cm  Current therapy: Zytiga 1000 mg daily started in September 2017.  Interim History: Mr. [name] presents today for a follow-up visit. Since the last visit, he started Zytiga and prednisone and was hospitalized briefly for hyperglycemia. His hyperglycemia is related to him not taking his diabetes medication also related to prednisone. Prednisone has been permanently discontinued. Since his discharge, he reports some complications associated with Zytiga including fatigue, anorexia and 3 pound weight loss. He denied any nausea, vomiting or lower extremity edema. His performance status has been limited but not  dramatically changed.  He does not report any headaches, blurry vision, syncope or seizures. He does not report any fevers or chills or sweats. He does not report any cough, wheezing or hemoptysis. He does not report any nausea, vomiting, abdominal pain, hematochezia or melena. He does not report any frequency urgency or hesitancy. He does not report any hematuria or dysuria. He does not report any skeletal complaints. Remaining review of systems unremarkable.   Medications: I have reviewed the patient's current medications.  Current Outpatient Prescriptions  Medication Sig Dispense Refill  . atenolol (TENORMIN) 100 MG tablet Take 50 mg by mouth daily.     Marland Kitchen atorvastatin (LIPITOR) 10 MG tablet Take 1 tablet (10 mg total) by mouth daily after supper. 90 tablet 0  . doxycycline (VIBRA-TABS) 100 MG tablet Take 1 tablet (100 mg total) by mouth every 12 (twelve) hours. 10 tablet 0  . furosemide (LASIX) 80 MG tablet     . GLIPIZIDE XL 5 MG 24 hr tablet TAKE 1 TABLET BY MOUTH DAILY WITH BREAKFAST. (Patient taking differently: TAKE 5mg  TABLET BY MOUTH DAILY WITH BREAKFAST.) 90 tablet 0  . ONE TOUCH ULTRA TEST test strip USE TO TEST ONCE DAILY 50 each 3  . ONETOUCH DELICA LANCETS 99991111 MISC USE TO TEST ONCE DAILY Dx code E11.65 100 each 0  . pioglitazone (ACTOS) 30 MG tablet TAKE 1 TABLET BY MOUTH EVERY DAY (Patient taking differently: TAKE 30mg  TABLET BY MOUTH EVERY DAY) 90 tablet 0  . potassium chloride SA (KLOR-CON M20) 20 MEQ tablet Take 1 tablet (20 mEq total) by mouth daily. 90 tablet 1  .  predniSONE (DELTASONE) 5 MG tablet     . TRADJENTA 5 MG TABS tablet TAKE 1 TABLET (5 MG TOTAL) BY MOUTH DAILY. 90 tablet 0  . ZYTIGA 250 MG tablet TAKE 4 TABLETS (1,000MG ) BY MOUTH EVERY DAY ON AN EMPTY STOMACH 1 HOURBEFORE OR 2 HOURS AFTER A MEAL 120 tablet 0   No current facility-administered medications for this visit.      Allergies: No Known Allergies  Past Medical History, Surgical history, Social  history, and Family History were reviewed and updated.   Physical Exam: Blood pressure 105/61, pulse 79, temperature 98.2 F (36.8 C), temperature source Oral, resp. rate 18, height 5' 8.5" (1.74 m), weight 142 lb (64.4 kg), SpO2 100 %. ECOG: 1 General appearance: alert and cooperative Head: Normocephalic, without obvious abnormality Neck: no adenopathy Lymph nodes: Cervical, supraclavicular, and axillary nodes normal. Heart:regular rate and rhythm, S1, S2 normal, no murmur, click, rub or gallop Lung:chest clear, no wheezing, rales, normal symmetric air entry, Heart exam - S1, S2 normal, no murmur, no gallop, rate regular Abdomin: soft, non-tender, without masses or organomegaly EXT:no erythema, induration, or nodules   Lab Results: Lab Results  Component Value Date   WBC 8.7 01/25/2016   HGB 9.2 (L) 01/25/2016   HCT 29.2 (L) 01/25/2016   MCV 90.4 01/25/2016   PLT 279 01/25/2016     Chemistry      Component Value Date/Time   NA 146 (H) 01/25/2016 0912   K 3.2 (L) 01/25/2016 0912   CL 104 01/11/2016 0535   CO2 27 01/25/2016 0912   BUN 23.0 01/25/2016 0912   CREATININE 2.8 (H) 01/25/2016 0912      Component Value Date/Time   CALCIUM 9.0 01/25/2016 0912   ALKPHOS 140 01/25/2016 0912   AST 65 (H) 01/25/2016 0912   ALT 29 01/25/2016 0912   BILITOT 0.53 01/25/2016 0912       Impression and Plan:  75 year old gentleman with the following issues:  1. 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 His initial diagnosis was 2006 had a stage TIc and a Gleason score 4+4 = 8. He is status post cryotherapy in a fairly and subsequently treated with androgen deprivation and currently on Vantas. His PSA has started to rise in the last year and was up to 43.6 on July 2017.  He is currently on Zytiga 1000 mg daily with prednisone discontinued because of hypoglycemia. He does report side effects associated  with this medication including fatigue, tiredness and weight loss. I recommended reducing the dose to 500 mg daily moving forward starting on 01/26/2016. His PSA is currently pending and continuation of this medication will be depending on overall response.  2. Androgen deprivation: He will continue that under the care of Dr. Gaynelle Arabian. He has currently Vantas implant.  3. Bone directed therapy: He has no evidence of bony metastasis at this time.  4. Diabetes: Under the care of Dr. Dwyane Dee. He is taking his medication regularly at this time.  5. Hypokalemia: We will continue to monitor and replace as needed.  6. Follow-up: Will be 4 weeks sooner if needed to.   [id number], MD 10/18/201710:24 AM

## 2016-01-25 NOTE — Telephone Encounter (Signed)
Gave patient avs report and appointments for November.  °

## 2016-01-26 ENCOUNTER — Telehealth: Payer: Self-pay | Admitting: *Deleted

## 2016-01-26 LAB — PSA: PROSTATE SPECIFIC AG, SERUM: 38.9 ng/mL — AB (ref 0.0–4.0)

## 2016-01-26 NOTE — Progress Notes (Signed)
Spoke with patient, gave results of last PSA 

## 2016-01-26 NOTE — Telephone Encounter (Signed)
-----   Message from Wyatt Portela, MD sent at 01/26/2016  8:31 AM EDT ----- Please let him know his PSA went down. It was 42 and now 38.9

## 2016-01-26 NOTE — Telephone Encounter (Signed)
LM for patient to call me.

## 2016-01-28 ENCOUNTER — Encounter (HOSPITAL_COMMUNITY): Payer: Self-pay | Admitting: *Deleted

## 2016-01-28 ENCOUNTER — Emergency Department (HOSPITAL_COMMUNITY): Payer: Medicare Other

## 2016-01-28 ENCOUNTER — Inpatient Hospital Stay (HOSPITAL_COMMUNITY)
Admission: EM | Admit: 2016-01-28 | Discharge: 2016-02-02 | DRG: 193 | Disposition: A | Discharge status: Skilled Nursing Facility | Payer: Medicare Other | Attending: Internal Medicine | Admitting: Internal Medicine

## 2016-01-28 ENCOUNTER — Other Ambulatory Visit: Payer: Self-pay

## 2016-01-28 DIAGNOSIS — E1122 Type 2 diabetes mellitus with diabetic chronic kidney disease: Secondary | ICD-10-CM | POA: Diagnosis present

## 2016-01-28 DIAGNOSIS — R269 Unspecified abnormalities of gait and mobility: Secondary | ICD-10-CM | POA: Diagnosis present

## 2016-01-28 DIAGNOSIS — R531 Weakness: Secondary | ICD-10-CM

## 2016-01-28 DIAGNOSIS — M47812 Spondylosis without myelopathy or radiculopathy, cervical region: Secondary | ICD-10-CM | POA: Diagnosis present

## 2016-01-28 DIAGNOSIS — I129 Hypertensive chronic kidney disease with stage 1 through stage 4 chronic kidney disease, or unspecified chronic kidney disease: Secondary | ICD-10-CM | POA: Diagnosis present

## 2016-01-28 DIAGNOSIS — E784 Other hyperlipidemia: Secondary | ICD-10-CM | POA: Diagnosis not present

## 2016-01-28 DIAGNOSIS — Z6821 Body mass index (BMI) 21.0-21.9, adult: Secondary | ICD-10-CM | POA: Diagnosis not present

## 2016-01-28 DIAGNOSIS — E2740 Unspecified adrenocortical insufficiency: Secondary | ICD-10-CM | POA: Diagnosis not present

## 2016-01-28 DIAGNOSIS — R296 Repeated falls: Secondary | ICD-10-CM

## 2016-01-28 DIAGNOSIS — Y95 Nosocomial condition: Secondary | ICD-10-CM | POA: Diagnosis present

## 2016-01-28 DIAGNOSIS — E119 Type 2 diabetes mellitus without complications: Secondary | ICD-10-CM | POA: Diagnosis not present

## 2016-01-28 DIAGNOSIS — Z87891 Personal history of nicotine dependence: Secondary | ICD-10-CM

## 2016-01-28 DIAGNOSIS — I82531 Chronic embolism and thrombosis of right popliteal vein: Secondary | ICD-10-CM | POA: Diagnosis present

## 2016-01-28 DIAGNOSIS — Z86711 Personal history of pulmonary embolism: Secondary | ICD-10-CM | POA: Diagnosis not present

## 2016-01-28 DIAGNOSIS — S199XXA Unspecified injury of neck, initial encounter: Secondary | ICD-10-CM | POA: Diagnosis not present

## 2016-01-28 DIAGNOSIS — E11649 Type 2 diabetes mellitus with hypoglycemia without coma: Secondary | ICD-10-CM | POA: Diagnosis present

## 2016-01-28 DIAGNOSIS — M79671 Pain in right foot: Secondary | ICD-10-CM

## 2016-01-28 DIAGNOSIS — J189 Pneumonia, unspecified organism: Principal | ICD-10-CM

## 2016-01-28 DIAGNOSIS — I10 Essential (primary) hypertension: Secondary | ICD-10-CM | POA: Diagnosis not present

## 2016-01-28 DIAGNOSIS — C61 Malignant neoplasm of prostate: Secondary | ICD-10-CM | POA: Diagnosis not present

## 2016-01-28 DIAGNOSIS — M542 Cervicalgia: Secondary | ICD-10-CM | POA: Diagnosis not present

## 2016-01-28 DIAGNOSIS — N183 Chronic kidney disease, stage 3 unspecified: Secondary | ICD-10-CM | POA: Diagnosis present

## 2016-01-28 DIAGNOSIS — E785 Hyperlipidemia, unspecified: Secondary | ICD-10-CM | POA: Diagnosis present

## 2016-01-28 DIAGNOSIS — E2749 Other adrenocortical insufficiency: Secondary | ICD-10-CM | POA: Diagnosis present

## 2016-01-28 DIAGNOSIS — M7989 Other specified soft tissue disorders: Secondary | ICD-10-CM | POA: Diagnosis not present

## 2016-01-28 DIAGNOSIS — Z7901 Long term (current) use of anticoagulants: Secondary | ICD-10-CM | POA: Diagnosis not present

## 2016-01-28 DIAGNOSIS — R29898 Other symptoms and signs involving the musculoskeletal system: Secondary | ICD-10-CM | POA: Diagnosis present

## 2016-01-28 DIAGNOSIS — N179 Acute kidney failure, unspecified: Secondary | ICD-10-CM | POA: Diagnosis not present

## 2016-01-28 DIAGNOSIS — K5900 Constipation, unspecified: Secondary | ICD-10-CM | POA: Diagnosis present

## 2016-01-28 DIAGNOSIS — I82511 Chronic embolism and thrombosis of right femoral vein: Secondary | ICD-10-CM | POA: Diagnosis present

## 2016-01-28 DIAGNOSIS — M19071 Primary osteoarthritis, right ankle and foot: Secondary | ICD-10-CM | POA: Diagnosis not present

## 2016-01-28 DIAGNOSIS — Z8249 Family history of ischemic heart disease and other diseases of the circulatory system: Secondary | ICD-10-CM

## 2016-01-28 DIAGNOSIS — Z9221 Personal history of antineoplastic chemotherapy: Secondary | ICD-10-CM | POA: Diagnosis not present

## 2016-01-28 DIAGNOSIS — M545 Low back pain: Secondary | ICD-10-CM | POA: Diagnosis not present

## 2016-01-28 DIAGNOSIS — D649 Anemia, unspecified: Secondary | ICD-10-CM | POA: Diagnosis present

## 2016-01-28 DIAGNOSIS — M6281 Muscle weakness (generalized): Secondary | ICD-10-CM | POA: Diagnosis not present

## 2016-01-28 DIAGNOSIS — E1129 Type 2 diabetes mellitus with other diabetic kidney complication: Secondary | ICD-10-CM

## 2016-01-28 DIAGNOSIS — E1165 Type 2 diabetes mellitus with hyperglycemia: Secondary | ICD-10-CM | POA: Diagnosis present

## 2016-01-28 DIAGNOSIS — Z7984 Long term (current) use of oral hypoglycemic drugs: Secondary | ICD-10-CM

## 2016-01-28 DIAGNOSIS — Z8546 Personal history of malignant neoplasm of prostate: Secondary | ICD-10-CM

## 2016-01-28 DIAGNOSIS — Z923 Personal history of irradiation: Secondary | ICD-10-CM | POA: Diagnosis not present

## 2016-01-28 DIAGNOSIS — R2689 Other abnormalities of gait and mobility: Secondary | ICD-10-CM

## 2016-01-28 DIAGNOSIS — R262 Difficulty in walking, not elsewhere classified: Secondary | ICD-10-CM | POA: Diagnosis not present

## 2016-01-28 DIAGNOSIS — Z833 Family history of diabetes mellitus: Secondary | ICD-10-CM | POA: Diagnosis not present

## 2016-01-28 DIAGNOSIS — E43 Unspecified severe protein-calorie malnutrition: Secondary | ICD-10-CM | POA: Diagnosis present

## 2016-01-28 DIAGNOSIS — E46 Unspecified protein-calorie malnutrition: Secondary | ICD-10-CM | POA: Diagnosis not present

## 2016-01-28 DIAGNOSIS — M79609 Pain in unspecified limb: Secondary | ICD-10-CM | POA: Diagnosis not present

## 2016-01-28 DIAGNOSIS — W19XXXA Unspecified fall, initial encounter: Secondary | ICD-10-CM

## 2016-01-28 DIAGNOSIS — S0990XA Unspecified injury of head, initial encounter: Secondary | ICD-10-CM | POA: Diagnosis not present

## 2016-01-28 DIAGNOSIS — R918 Other nonspecific abnormal finding of lung field: Secondary | ICD-10-CM | POA: Diagnosis not present

## 2016-01-28 LAB — CBC WITH DIFFERENTIAL/PLATELET
BASOS PCT: 0 %
Basophils Absolute: 0 10*3/uL (ref 0.0–0.1)
EOS ABS: 0.1 10*3/uL (ref 0.0–0.7)
EOS PCT: 1 %
HCT: 25.9 % — ABNORMAL LOW (ref 39.0–52.0)
Hemoglobin: 8.3 g/dL — ABNORMAL LOW (ref 13.0–17.0)
LYMPHS ABS: 1.3 10*3/uL (ref 0.7–4.0)
Lymphocytes Relative: 10 %
MCH: 28.8 pg (ref 26.0–34.0)
MCHC: 32 g/dL (ref 30.0–36.0)
MCV: 89.9 fL (ref 78.0–100.0)
MONO ABS: 0.7 10*3/uL (ref 0.1–1.0)
MONOS PCT: 5 %
NEUTROS PCT: 84 %
Neutro Abs: 10.9 10*3/uL — ABNORMAL HIGH (ref 1.7–7.7)
PLATELETS: 233 10*3/uL (ref 150–400)
RBC: 2.88 MIL/uL — ABNORMAL LOW (ref 4.22–5.81)
RDW: 14.6 % (ref 11.5–15.5)
WBC: 12.9 10*3/uL — ABNORMAL HIGH (ref 4.0–10.5)

## 2016-01-28 LAB — COMPREHENSIVE METABOLIC PANEL
ALK PHOS: 92 U/L (ref 38–126)
ALT: 18 U/L (ref 17–63)
AST: 25 U/L (ref 15–41)
Albumin: 2.6 g/dL — ABNORMAL LOW (ref 3.5–5.0)
Anion gap: 10 (ref 5–15)
BUN: 36 mg/dL — AB (ref 6–20)
CALCIUM: 8.4 mg/dL — AB (ref 8.9–10.3)
CO2: 28 mmol/L (ref 22–32)
CREATININE: 2.62 mg/dL — AB (ref 0.61–1.24)
Chloride: 107 mmol/L (ref 101–111)
GFR calc non Af Amer: 22 mL/min — ABNORMAL LOW (ref >60)
GFR, EST AFRICAN AMERICAN: 26 mL/min — AB (ref >60)
GLUCOSE: 147 mg/dL — AB (ref 65–99)
Potassium: 3.6 mmol/L (ref 3.5–5.1)
SODIUM: 145 mmol/L (ref 135–145)
Total Bilirubin: 0.9 mg/dL (ref 0.3–1.2)
Total Protein: 6.3 g/dL — ABNORMAL LOW (ref 6.5–8.1)

## 2016-01-28 LAB — URINALYSIS, ROUTINE W REFLEX MICROSCOPIC
BILIRUBIN URINE: NEGATIVE
Glucose, UA: NEGATIVE mg/dL
HGB URINE DIPSTICK: NEGATIVE
KETONES UR: NEGATIVE mg/dL
Leukocytes, UA: NEGATIVE
NITRITE: NEGATIVE
Protein, ur: 30 mg/dL — AB
Specific Gravity, Urine: 1.018 (ref 1.005–1.030)
pH: 6 (ref 5.0–8.0)

## 2016-01-28 LAB — URINE MICROSCOPIC-ADD ON: RBC / HPF: NONE SEEN RBC/hpf (ref 0–5)

## 2016-01-28 LAB — PROTIME-INR
INR: 1.28
PROTHROMBIN TIME: 16.1 s — AB (ref 11.4–15.2)

## 2016-01-28 MED ORDER — VANCOMYCIN HCL IN DEXTROSE 1-5 GM/200ML-% IV SOLN
1000.0000 mg | INTRAVENOUS | Status: AC
  Administered 2016-01-29: 1000 mg via INTRAVENOUS
  Filled 2016-01-28: qty 200

## 2016-01-28 MED ORDER — DEXTROSE 5 % IV SOLN
1.0000 g | INTRAVENOUS | Status: DC
  Administered 2016-01-28 – 2016-01-29 (×2): 1 g via INTRAVENOUS
  Filled 2016-01-28 (×2): qty 1

## 2016-01-28 MED ORDER — VANCOMYCIN HCL IN DEXTROSE 750-5 MG/150ML-% IV SOLN
750.0000 mg | INTRAVENOUS | Status: DC
  Filled 2016-01-28: qty 150

## 2016-01-28 MED ORDER — TRAMADOL HCL 50 MG PO TABS
50.0000 mg | ORAL_TABLET | Freq: Once | ORAL | Status: AC
  Administered 2016-01-28: 50 mg via ORAL
  Filled 2016-01-28: qty 1

## 2016-01-28 MED ORDER — DEXTROSE 5 % IV SOLN
1.0000 g | Freq: Three times a day (TID) | INTRAVENOUS | Status: DC
  Filled 2016-01-28 (×2): qty 1

## 2016-01-28 NOTE — ED Notes (Signed)
Dr. Lily Kocher,  Hospitalist, at bedside.

## 2016-01-28 NOTE — Progress Notes (Signed)
Pharmacy Antibiotic Note  [name] is a 75 y.o. male admitted on 01/28/2016 with pneumonia.  Pharmacy has been consulted for Vancomycin dosing and to adjust other antibiotic dosing as needed based on renal function.   01/28/16  Scr = 2.62 with CrCl ~ 22 ml/min   Plan:  Vancomycin 1gm IV x 1 followed by 750mg  IV q24h  Vancomycin trough goal: 15-20 mcg/ml  Adjust Cefepime to 1gm IV q24h x 8 days based on current CrCl  Follow renal functions, clinical course   Temp (24hrs), Avg:98.6 F (37 C), Min:98.6 F (37 C), Max:98.6 F (37 C)   Recent Labs Lab 01/25/16 0912 01/28/16 1932  WBC 8.7 12.9*  CREATININE 2.8* 2.62*    Estimated Creatinine Clearance: 22.5 mL/min (by C-G formula based on SCr of 2.62 mg/dL (H)).    No Known Allergies  Antimicrobials this admission: 10/21 Vanc >>   10/21 Cefepime >>    Dose adjustments this admission:    Microbiology results:  Thank you for allowing pharmacy to be a part of this patient's care.  Everette Rank, PharmD 01/28/2016 10:54 PM

## 2016-01-28 NOTE — ED Triage Notes (Signed)
Pt complains of weakness for the past week. Pt also complains of 6/10 left neck and right shoulder pain. Pt has had decreased appetite this week. Pt usually uses a cane to ambulate, but family state the pt has been unable to use cane for the past 2 weeks,

## 2016-01-28 NOTE — ED Provider Notes (Signed)
[facility] DEPT Provider Note   CSN: [id number] Arrival date & time: 01/28/16  1731     History   Chief Complaint Chief Complaint  Patient presents with  . Weakness    chemo pt    HPI NEHORAI REHBEIN is a 75 y.o. male.  Patient is a 75 year old male with a history of metastatic prostate cancer, on Zytiga, DVT on coumadin, hypertension, hyperlipidemia and chronic kidney disease who presents with generalized weakness. He states over the last week he's had increasing weakness. His wife states that he is not eating. He does drink some fluids but is not able to eat. He's not been able to ambulate on his own. Her last 2-3 days he's had increasing pain to his neck. It's in the center of his neck and radiates to his left shoulder. He denies any numbness to his arms. There is no radiation down his arms. He has weakness to both upper extremities but he feels weak all over. He has had some recent falls. He's had 2 falls in the bathroom that were unwitnessed by his wife. He denies any neck pain following the falls. He denies any other injuries from the falls. He's had a little bit of a cough but no shortness of breath. No chest pain. No urinary symptoms. No nausea vomiting or diarrhea.    Weakness  Primary symptoms include no dizziness. Pertinent negatives include no shortness of breath, no chest pain, no vomiting and no headaches.    Past Medical History:  Diagnosis Date  . Cataract   . Diabetes mellitus   . Hyperlipidemia   . Hypertension   . Prostate cancer ([place])   . Renal insufficiency     Patient Active Problem List   Diagnosis Date Noted  . HCAP (healthcare-associated pneumonia) 01/28/2016  . Dehydration   . Hypomagnesemia   . Protein-calorie malnutrition, severe 01/09/2016  . Hyperglycemia 01/08/2016  . Hypokalemia 01/08/2016  . Acute kidney injury superimposed on chronic kidney disease ([place]) 01/08/2016  . Prolonged Q-T interval on ECG 01/08/2016  . 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 Bilateral lower extremity edema 09/07/2013  . Type II or unspecified type diabetes mellitus without mention of complication, uncontrolled 05/20/2013  . Pulmonary embolus ([place]) 09/01/2010  . Diabetes mellitus type 2 in nonobese ([place]) 09/15/2006  . Hyperlipidemia associated with type 2 diabetes mellitus ([place]) 09/13/2006  . Essential hypertension 09/13/2006  . Disorder resulting from impaired renal function 09/13/2006  . PROSTATE CANCER, HX OF 09/13/2006    Past Surgical History:  Procedure Laterality Date  . PROSTATE SURGERY     radiation therapy only       Home Medications    Prior to Admission medications   Medication Sig Start Date End Date Taking? Authorizing Provider  atenolol (TENORMIN) 100 MG tablet Take 50 mg by mouth daily.  02/09/13  Yes Historical Provider, MD  atorvastatin (LIPITOR) 10 MG tablet Take 1 tablet (10 mg total) by mouth daily after supper. 10/10/15  Yes Elayne Snare, MD  furosemide (LASIX) 40 MG tablet Take 40 mg by mouth daily.  01/11/16  Yes Historical Provider, MD  GLIPIZIDE XL 5 MG 24 hr tablet TAKE 1 TABLET BY MOUTH DAILY WITH BREAKFAST. Patient taking differently: TAKE 5mg  TABLET BY MOUTH DAILY WITH BREAKFAST. 11/24/15  Yes Elayne Snare, MD  pioglitazone (ACTOS) 30 MG tablet TAKE 1 TABLET BY MOUTH EVERY DAY Patient taking differently: TAKE 30mg  TABLET BY MOUTH EVERY DAY 01/02/16  Yes Elayne Snare, MD  potassium chloride  SA (KLOR-CON M20) 20 MEQ tablet Take 1 tablet (20 mEq total) by mouth daily. 09/01/15  Yes [name], FNP  TRADJENTA 5 MG TABS tablet TAKE 1 TABLET (5 MG TOTAL) BY MOUTH DAILY. 11/28/15  Yes Elayne Snare, MD  ZYTIGA 250 MG tablet TAKE 4 TABLETS (1,000MG ) BY MOUTH EVERY DAY ON AN EMPTY STOMACH 1 HOURBEFORE OR 2 HOURS AFTER A MEAL 01/10/16  Yes Wyatt Portela, MD  doxycycline (VIBRA-TABS) 100 MG tablet Take 1 tablet (100 mg total) by mouth every 12 (twelve) hours. Patient not taking: Reported on 01/28/2016 01/11/16   Barton Dubois, MD    ONE [facility] ULTRA TEST test strip USE TO TEST ONCE DAILY 08/29/15   Elayne Snare, MD  [facility] DELICA LANCETS [id number] MISC USE TO TEST ONCE DAILY Dx code E11.65 01/04/16   Elayne Snare, MD    Family History Family History  Problem Relation Age of Onset  . Diabetes Father   . Diabetes Sister   . Hypertension Sister   . Hypertension Brother   . Heart disease Neg Hx     Social History Social History  Substance Use Topics  . Smoking status: Former Smoker    Years: 10.00    Quit date: 2000  . Smokeless tobacco: Never Used  . Alcohol use No     Allergies   Review of patient's allergies indicates no known allergies.   Review of Systems Review of Systems  Constitutional: Positive for appetite change and fatigue. Negative for chills, diaphoresis and fever.  HENT: Negative for congestion, rhinorrhea and sneezing.   Eyes: Negative.   Respiratory: Negative for cough, chest tightness and shortness of breath.   Cardiovascular: Negative for chest pain and leg swelling.  Gastrointestinal: Negative for abdominal pain, blood in stool, diarrhea, nausea and vomiting.  Genitourinary: Negative for difficulty urinating, flank pain, frequency and hematuria.  Musculoskeletal: Positive for neck pain. Negative for arthralgias and back pain.  Skin: Negative for rash.  Neurological: Positive for weakness. Negative for dizziness, speech difficulty, numbness and headaches.     Physical Exam Updated Vital Signs BP 121/69   Pulse 68   Temp 98.6 F (37 C) (Oral)   Resp 19   SpO2 97%   Physical Exam  Constitutional: He is oriented to person, place, and time. He appears well-developed and well-nourished.  HENT:  Head: Normocephalic and atraumatic.  Eyes: Pupils are equal, round, and reactive to light.  Neck: Normal range of motion. Neck supple.  Positive tenderness to his mid and lower cervical spine. There is no pain to the thoracic or lumbosacral spine. There is also tenderness over the left trapezius  muscle.  Cardiovascular: Normal rate, regular rhythm and normal heart sounds.   Pulmonary/Chest: Effort normal and breath sounds normal. No respiratory distress. He has no wheezes. He has no rales. He exhibits no tenderness.  Abdominal: Soft. Bowel sounds are normal. There is no tenderness. There is no rebound and no guarding.  Musculoskeletal: Normal range of motion. He exhibits no edema.  Lymphadenopathy:    He has no cervical adenopathy.  Neurological: He is alert and oriented to person, place, and time.  Motor 4 out of 5 in the upper extremities bilaterally, 4 out of 5 in lower extremities bilaterally. Sensation grossly intact to light touch all extremities. No facial droop.  Skin: Skin is warm and dry. No rash noted.  Psychiatric: He has a normal mood and affect.     ED Treatments / Results  Labs (all labs ordered  are listed, but only abnormal results are displayed) Labs Reviewed  COMPREHENSIVE METABOLIC PANEL - Abnormal; Notable for the following:       Result Value   Glucose, Bld 147 (*)    BUN 36 (*)    Creatinine, Ser 2.62 (*)    Calcium 8.4 (*)    Total Protein 6.3 (*)    Albumin 2.6 (*)    GFR calc non Af Amer 22 (*)    GFR calc Af Amer 26 (*)    All other components within normal limits  CBC WITH DIFFERENTIAL/PLATELET - Abnormal; Notable for the following:    WBC 12.9 (*)    RBC 2.88 (*)    Hemoglobin 8.3 (*)    HCT 25.9 (*)    Neutro Abs 10.9 (*)    All other components within normal limits  URINALYSIS, ROUTINE W REFLEX MICROSCOPIC (NOT AT [facility]) - Abnormal; Notable for the following:    Color, Urine AMBER (*)    APPearance CLOUDY (*)    Protein, ur 30 (*)    All other components within normal limits  PROTIME-INR - Abnormal; Notable for the following:    Prothrombin Time 16.1 (*)    All other components within normal limits  URINE MICROSCOPIC-ADD ON - Abnormal; Notable for the following:    Squamous Epithelial / LPF 0-5 (*)    Bacteria, UA RARE (*)    All  other components within normal limits    EKG  EKG Interpretation  Date/Time:  Saturday January 28 2016 19:39:50 EDT Ventricular Rate:  77 PR Interval:    QRS Duration: 84 QT Interval:  390 QTC Calculation: 442 R Axis:   -22 Text Interpretation:  Sinus rhythm Multiple premature complexes, vent & supraven Borderline left axis deviation Anterior infarct, old Nonspecific T abnormalities, lateral leads Baseline wander in lead(s) II aVF V4 V5 since last tracing no significant change Confirmed by Brendalyn Vallely  MD, Marlene Beidler ([id number]) on 01/28/2016 9:58:36 PM       Radiology Dg Chest 2 View  Result Date: 01/28/2016 CLINICAL DATA:  Weakness with left neck pain and right shoulder pain. EXAM: CHEST  2 VIEW COMPARISON:  01/09/2016 and 08/20/2010 FINDINGS: Lungs are adequately inflated with hazy opacification over the posterior mid to lower lungs on the lateral film which may be retrocardiac on the frontal film. No evidence of effusion. Cardiomediastinal silhouette is within normal. There is mild degenerate change of the spine. IMPRESSION: Hazy opacification over the posterior mid to lower lungs on the lateral film possibly within the left lower lobe and may be due to pneumonia. Recommend follow-up chest radiograph in 3-4 weeks to document resolution. Electronically Signed   By: Marin Olp M.D.   On: 01/28/2016 20:15   Ct Head Wo Contrast  Result Date: 01/28/2016 CLINICAL DATA:  Recent fall with neck pain. History of metastatic prostate cancer. EXAM: CT HEAD WITHOUT CONTRAST CT CERVICAL SPINE WITHOUT CONTRAST TECHNIQUE: Multidetector CT imaging of the head and cervical spine was performed following the standard protocol without intravenous contrast. Multiplanar CT image reconstructions of the cervical spine were also generated. COMPARISON:  None. FINDINGS: CT HEAD FINDINGS Brain: Ventricles, cisterns and other CSF spaces are within normal. There is no mass, mass effect, shift of midline structures or acute  hemorrhage. There is no evidence of acute infarction. Minimal chronic ischemic microvascular disease. Vascular: Calcified plaque over the cavernous segment of the internal carotid arteries. Skull: Within normal. Sinuses/Orbits: Within normal. CT CERVICAL SPINE FINDINGS Alignment: Subtle 1-2 mm degenerative  posterior subluxation of C3 on C4. Skull base and vertebrae: Mild spondylosis throughout the cervical spine. Mild uncovertebral joint spurring and facet arthropathy. Soft tissues and spinal canal: Within normal. Disc levels:  Moderate disc space narrowing at the C3-4 level. Upper chest: Within normal. Other: Sclerotic focus over the spinous process of C3 which may be due to bone island versus metastatic disease in this patient with known metastatic prostate cancer. Small rounded lucent lesion over the right side of the C5 vertebral body which may represent a small hemangioma and less likely metastatic focus. IMPRESSION: No acute intracranial findings. Minimal chronic ischemic microvascular disease. No acute cervical spine injury. Mild spondylosis throughout the cervical spine with disc disease at the C3-4 level. Sclerotic focus over the C3 spinous process which may be due to bone island versus metastatic disease in this patient with known metastatic prostate cancer. Electronically Signed   By: Marin Olp M.D.   On: 01/28/2016 21:23   Ct Cervical Spine Wo Contrast  Result Date: 01/28/2016 CLINICAL DATA:  Recent fall with neck pain. History of metastatic prostate cancer. EXAM: CT HEAD WITHOUT CONTRAST CT CERVICAL SPINE WITHOUT CONTRAST TECHNIQUE: Multidetector CT imaging of the head and cervical spine was performed following the standard protocol without intravenous contrast. Multiplanar CT image reconstructions of the cervical spine were also generated. COMPARISON:  None. FINDINGS: CT HEAD FINDINGS Brain: Ventricles, cisterns and other CSF spaces are within normal. There is no mass, mass effect, shift of  midline structures or acute hemorrhage. There is no evidence of acute infarction. Minimal chronic ischemic microvascular disease. Vascular: Calcified plaque over the cavernous segment of the internal carotid arteries. Skull: Within normal. Sinuses/Orbits: Within normal. CT CERVICAL SPINE FINDINGS Alignment: Subtle 1-2 mm degenerative posterior subluxation of C3 on C4. Skull base and vertebrae: Mild spondylosis throughout the cervical spine. Mild uncovertebral joint spurring and facet arthropathy. Soft tissues and spinal canal: Within normal. Disc levels:  Moderate disc space narrowing at the C3-4 level. Upper chest: Within normal. Other: Sclerotic focus over the spinous process of C3 which may be due to bone island versus metastatic disease in this patient with known metastatic prostate cancer. Small rounded lucent lesion over the right side of the C5 vertebral body which may represent a small hemangioma and less likely metastatic focus. IMPRESSION: No acute intracranial findings. Minimal chronic ischemic microvascular disease. No acute cervical spine injury. Mild spondylosis throughout the cervical spine with disc disease at the C3-4 level. Sclerotic focus over the C3 spinous process which may be due to bone island versus metastatic disease in this patient with known metastatic prostate cancer. Electronically Signed   By: Marin Olp M.D.   On: 01/28/2016 21:23    Procedures Procedures (including critical care time)  Medications Ordered in ED Medications  ceFEPIme (MAXIPIME) 1 g in dextrose 5 % 50 mL IVPB (1 g Intravenous New Bag/Given 01/28/16 2311)  vancomycin (VANCOCIN) IVPB 1000 mg/200 mL premix (not administered)  vancomycin (VANCOCIN) IVPB 750 mg/150 ml premix (not administered)  traMADol (ULTRAM) tablet 50 mg (50 mg Oral Given 01/28/16 2234)     Initial Impression / Assessment and Plan / ED Course  I have reviewed the triage vital signs and the nursing notes.  Pertinent labs & imaging  results that were available during my care of the patient were reviewed by me and considered in my medical decision making (see chart for details).  Clinical Course    Patient presents with generalized weakness. He is unable to ambulate and is  not eating. Chest x-ray shows evidence of pneumonia. He was started on antibiotics for healthcare associated pneumonia. He also pain in his neck. There is no evidence of acute cervical injuries. There is a question of a metastatic lesion. His head CT doesn't show any evidence of intracranial hemorrhage or other abnormalities. I spoke with Dr. Eulas Post who will admit the patient to a MedSurg bed.  Final Clinical Impressions(s) / ED Diagnoses   Final diagnoses:  Weakness  HCAP (healthcare-associated pneumonia)    New Prescriptions New Prescriptions   No medications on file     Malvin Johns, MD 01/28/16 2351

## 2016-01-29 ENCOUNTER — Inpatient Hospital Stay (HOSPITAL_COMMUNITY): Payer: Medicare Other

## 2016-01-29 DIAGNOSIS — R29898 Other symptoms and signs involving the musculoskeletal system: Secondary | ICD-10-CM | POA: Diagnosis present

## 2016-01-29 DIAGNOSIS — R531 Weakness: Secondary | ICD-10-CM

## 2016-01-29 LAB — GLUCOSE, CAPILLARY
GLUCOSE-CAPILLARY: 58 mg/dL — AB (ref 65–99)
GLUCOSE-CAPILLARY: 51 mg/dL — AB (ref 65–99)
GLUCOSE-CAPILLARY: 243 mg/dL — AB (ref 65–99)
Glucose-Capillary: 108 mg/dL — ABNORMAL HIGH (ref 65–99)
Glucose-Capillary: 128 mg/dL — ABNORMAL HIGH (ref 65–99)
Glucose-Capillary: 128 mg/dL — ABNORMAL HIGH (ref 65–99)
Glucose-Capillary: 213 mg/dL — ABNORMAL HIGH (ref 65–99)
Glucose-Capillary: 55 mg/dL — ABNORMAL LOW (ref 65–99)
Glucose-Capillary: 56 mg/dL — ABNORMAL LOW (ref 65–99)
Glucose-Capillary: 66 mg/dL (ref 65–99)
Glucose-Capillary: 81 mg/dL (ref 65–99)

## 2016-01-29 LAB — CBC
HCT: 25.8 % — ABNORMAL LOW (ref 39.0–52.0)
Hemoglobin: 8.3 g/dL — ABNORMAL LOW (ref 13.0–17.0)
MCH: 28.9 pg (ref 26.0–34.0)
MCHC: 32.2 g/dL (ref 30.0–36.0)
MCV: 89.9 fL (ref 78.0–100.0)
PLATELETS: 235 10*3/uL (ref 150–400)
RBC: 2.87 MIL/uL — ABNORMAL LOW (ref 4.22–5.81)
RDW: 14.7 % (ref 11.5–15.5)
WBC: 12.4 10*3/uL — AB (ref 4.0–10.5)

## 2016-01-29 LAB — BASIC METABOLIC PANEL
ANION GAP: 7 (ref 5–15)
BUN: 34 mg/dL — ABNORMAL HIGH (ref 6–20)
CALCIUM: 8.3 mg/dL — AB (ref 8.9–10.3)
CO2: 29 mmol/L (ref 22–32)
Chloride: 107 mmol/L (ref 101–111)
Creatinine, Ser: 2.54 mg/dL — ABNORMAL HIGH (ref 0.61–1.24)
GFR, EST AFRICAN AMERICAN: 27 mL/min — AB (ref >60)
GFR, EST NON AFRICAN AMERICAN: 23 mL/min — AB (ref >60)
Glucose, Bld: 138 mg/dL — ABNORMAL HIGH (ref 65–99)
Potassium: 3.4 mmol/L — ABNORMAL LOW (ref 3.5–5.1)
Sodium: 143 mmol/L (ref 135–145)

## 2016-01-29 LAB — GLUCOSE, RANDOM: Glucose, Bld: 215 mg/dL — ABNORMAL HIGH (ref 65–99)

## 2016-01-29 LAB — STREP PNEUMONIAE URINARY ANTIGEN: STREP PNEUMO URINARY ANTIGEN: NEGATIVE

## 2016-01-29 LAB — MRSA PCR SCREENING: MRSA BY PCR: NEGATIVE

## 2016-01-29 MED ORDER — INSULIN ASPART 100 UNIT/ML ~~LOC~~ SOLN
0.0000 [IU] | Freq: Every day | SUBCUTANEOUS | Status: DC
  Administered 2016-01-29: 2 [IU] via SUBCUTANEOUS
  Administered 2016-02-01: 0 [IU] via SUBCUTANEOUS

## 2016-01-29 MED ORDER — HEPARIN SODIUM (PORCINE) 5000 UNIT/ML IJ SOLN
5000.0000 [IU] | Freq: Three times a day (TID) | INTRAMUSCULAR | Status: DC
  Administered 2016-01-29 – 2016-01-31 (×7): 5000 [IU] via SUBCUTANEOUS
  Filled 2016-01-29 (×6): qty 1

## 2016-01-29 MED ORDER — INSULIN ASPART 100 UNIT/ML ~~LOC~~ SOLN
0.0000 [IU] | Freq: Three times a day (TID) | SUBCUTANEOUS | Status: DC
  Administered 2016-01-29 – 2016-01-30 (×2): 1 [IU] via SUBCUTANEOUS
  Administered 2016-01-30: 3 [IU] via SUBCUTANEOUS
  Administered 2016-01-31: 2 [IU] via SUBCUTANEOUS
  Administered 2016-01-31: 1 [IU] via SUBCUTANEOUS
  Administered 2016-01-31 – 2016-02-01 (×2): 2 [IU] via SUBCUTANEOUS
  Administered 2016-02-01: 1 [IU] via SUBCUTANEOUS
  Administered 2016-02-02: 2 [IU] via SUBCUTANEOUS
  Administered 2016-02-02: 1 [IU] via SUBCUTANEOUS

## 2016-01-29 MED ORDER — ATENOLOL 50 MG PO TABS
50.0000 mg | ORAL_TABLET | Freq: Every day | ORAL | Status: DC
  Administered 2016-01-29 – 2016-02-02 (×5): 50 mg via ORAL
  Filled 2016-01-29 (×5): qty 1

## 2016-01-29 MED ORDER — SODIUM CHLORIDE 0.9 % IV SOLN
INTRAVENOUS | Status: DC
  Administered 2016-01-29 (×2): via INTRAVENOUS

## 2016-01-29 MED ORDER — HYDROCODONE-ACETAMINOPHEN 5-325 MG PO TABS: 1.0000 | ORAL_TABLET | ORAL | Status: DC | PRN

## 2016-01-29 MED ORDER — ACETAMINOPHEN 325 MG PO TABS
650.0000 mg | ORAL_TABLET | Freq: Four times a day (QID) | ORAL | Status: DC | PRN
  Administered 2016-02-02: 650 mg via ORAL
  Filled 2016-01-29: qty 2

## 2016-01-29 MED ORDER — DEXTROSE 50 % IV SOLN
INTRAVENOUS | Status: AC
  Administered 2016-01-29: 25 mL
  Filled 2016-01-29: qty 50

## 2016-01-29 MED ORDER — ATORVASTATIN CALCIUM 10 MG PO TABS
10.0000 mg | ORAL_TABLET | Freq: Every day | ORAL | Status: DC
  Administered 2016-01-29 – 2016-02-01 (×4): 10 mg via ORAL
  Filled 2016-01-29 (×4): qty 1

## 2016-01-29 MED ORDER — DEXTROSE 50 % IV SOLN
25.0000 mL | Freq: Once | INTRAVENOUS | Status: AC
  Administered 2016-01-29: 25 mL via INTRAVENOUS

## 2016-01-29 NOTE — Evaluation (Addendum)
Physical Therapy Evaluation Patient Details Name: [name] MRN: [id number] DOB: 03-Jan-1941 Today's Date: 01/29/2016   History of Present Illness  Pt is a 75 y/o male with PMHx of prostate cancer, HTN, CKD, DVT, HLD, DM, and recent L wrist fx admitted with recent falls, weakness, and pain. Dx of PNA, AKI.   Clinical Impression  Pt admitted with above diagnosis. Pt currently with functional limitations due to the deficits listed below (see PT Problem List). Pt requires max assist for sit to stand. His wife is not able to physically assist him at home and he's had multiple falls prior to this admission. ST-SNF recommended.  Pt will benefit from skilled PT to increase their independence and safety with mobility to allow discharge to the venue listed below.       Follow Up Recommendations SNF (pt requires assist for transfers and ambulation)    Equipment Recommendations  Rolling walker with 5" wheels    Recommendations for Other Services       Precautions / Restrictions Precautions Precautions: Fall (per chart 3 falls PTA -pt denies) Precaution Comments: several falls at home per family (pt denies) Restrictions Weight Bearing Restrictions: No      Mobility  Bed Mobility               General bed mobility comments: NT -up in chair  Transfers Overall transfer level: Needs assistance Equipment used: Rolling walker (2 wheeled) Transfers: Sit to/from Stand Sit to Stand: +2 physical assistance;+2 safety/equipment;Max assist         General transfer comment: max assist for sit to stand from recliner, VCs for hand placement, blocked RLE as pt extended it out in front of him during set up  Ambulation/Gait Ambulation/Gait assistance: Min assist Ambulation Distance (Feet): 150 Feet Assistive device: Rolling walker (2 wheeled) Gait Pattern/deviations: Decreased step length - right;Decreased step length - left;Shuffle;Trunk flexed;Step-to pattern   Gait velocity  interpretation: at or above normal speed for age/gender General Gait Details: VCs to increase step length and for positioning in RW, pt inconsistent with reporting pain, he reported some pain in R foot with weightbearing but also denied pain when asked, min A for balance with turns  Stairs            Wheelchair Mobility    Modified Rankin (Stroke Patients Only)       Balance Overall balance assessment: History of Falls;Needs assistance   Sitting balance-Leahy Scale: Good       Standing balance-Leahy Scale: Fair                               Pertinent Vitals/Pain Pain Assessment: No/denies pain    Home Living Family/patient expects to be discharged to:: Private residence Living Arrangements: Spouse/significant other Available Help at Discharge: Family;Available PRN/intermittently Type of Home: House Home Access: Stairs to enter Entrance Stairs-Rails: None Entrance Stairs-Number of Steps: 2 Home Layout: One level Home Equipment: Cane - single point Additional Comments: pt's brother and sister report pt's spouse is home most of the time but is unable to physically assist pt    Prior Function Level of Independence: Independent         Comments: at baseline pt is independent, recently he's needed assistance for getting out of bed and for ADLs     Hand Dominance        Extremity/Trunk Assessment   Upper Extremity Assessment: Overall WFL for tasks assessed  Lower Extremity Assessment: Generalized weakness (pitting edema B feet, knee ext 4/5)      Cervical / Trunk Assessment: Normal  Communication   Communication: No difficulties  Cognition Arousal/Alertness: Awake/alert Behavior During Therapy: WFL for tasks assessed/performed Overall Cognitive Status: Difficult to assess (pt poor historian, stated he can bathe himself at home but family stated this isn't true)       Memory: Decreased short-term memory               General Comments      Exercises     Assessment/Plan    PT Assessment Patient needs continued PT services  PT Problem List Decreased strength;Decreased balance;Decreased mobility;Pain;Decreased activity tolerance;Decreased cognition          PT Treatment Interventions Gait training;DME instruction;Functional mobility training;Therapeutic activities;Therapeutic exercise;Balance training;Patient/family education    PT Goals (Current goals can be found in the Care Plan section)  Acute Rehab PT Goals Patient Stated Goal: to get stronger PT Goal Formulation: With patient/family Time For Goal Achievement: 02/12/16 Potential to Achieve Goals: Fair    Frequency Min 3X/week   Barriers to discharge Decreased caregiver support      Co-evaluation               End of Session Equipment Utilized During Treatment: Gait belt Activity Tolerance: Patient tolerated treatment well Patient left: in chair;with call bell/phone within reach;with chair alarm set;with family/visitor present Nurse Communication: Mobility status         Time: [id number] PT Time Calculation (min) (ACUTE ONLY): 29 min   Charges:   PT Evaluation $PT Eval Low Complexity: 1 Procedure     PT G CodesPhilomena Doheny 01/29/2016, 12:47 PM [phone]

## 2016-01-29 NOTE — Progress Notes (Signed)
Patient seen and examined.   Admitted early this morning for decreased po, weakness, gait instability. Developed cough, found to have LLL infiltrate so HCAP treatment started. See H&P for full details.   HCAP: Cont abx, follow cultures, vital signs stable Generalized weakness: PT/OT, fall precautions. Hold zytiga and discuss with Dr. Alen Blew in AM. Prednisone also stopped.   Vance Gather, MD 01/29/2016 2:33 PM

## 2016-01-29 NOTE — Progress Notes (Signed)
Patient's blood sugar at 1723 was 56, patient was given OJ  1 cup and peanut butter with graham crackers, at 1801 BS =58, another cup of OJ given, 1840 BS=51,d50 25 ml given as per policy, DR Bonner Puna notified, repeat BS at 1854 was 66, another d50 25 ml given, plus patient was given his dinner, repeat BS at 1931 =128.Patient instructed to try to eat all his dinner. To monitor patient closely for hypoglycemia.Patient remained awake and oriented ,Family at bedside.

## 2016-01-29 NOTE — Progress Notes (Signed)
Hypoglycemic Event  CBG: 55  Treatment: 15 GM carbohydrate snack  Symptoms: Pale  Follow-up CBG: [id number] CBG Result:81  Possible Reasons for Event: Inadequate meal intake  Comments/MD notified:dr Ilean Skill, Lanny Hurst

## 2016-01-29 NOTE — H&P (Signed)
History and Physical    [name] [id number] DOB: 1940/09/07 DOA: 01/28/2016  PCP: [name], FNP  ONC: Dr. Alen Blew  Patient coming from: Home  Chief Complaint: Generalized weakness, gait instability  HPI: [name] is a 75 y.o. gentleman with a history of castration-resistant metastatic prostate cancer, currently on Zytiga 500mg  daily under the direction of Dr. Alen Blew, who also had HTN, DM, and CKD 3.  He is accompanied by his wife, who reports one week of decreased appetite, poor oral intake, progressive weakness, and gait instability.  He has also had at least three falls.  He has had increased pain in his neck and left shoulder, and now he feels that he cannot ambulate normally, particularly with his right leg.  He has not taken any medications in an attempt to alleviate his pain.  He denies any head trauma or LOC.  He reports that he just developed a cough today.  No fever, sweats, or chills, but he has had hot flashes.  No chest pain but he has had some shortness of breath.  No nausea, vomiting, or abdominal pain.  ED Course: Chest xray concerning for LLL pneumonia.  CT of the head negative for acute process, but CT of the cervical spine shows mild spondylosis of the cervical spine with disc disease at C3-4 and a sclerotic focus over the C3 spinous process, which could represent metastatic disease.  The patient has received empiric vancomycin and cefepime, treating as HCAP, due to his recent hospitalization for hyperglycemia.  He has evidence of mild AKI on CKD 3.  He has a mild leukocytosis.  The patient is hemodynamically stable; lactic acid level was not checked.  Hospitalist asked to admit.  Review of Systems: As per HPI otherwise 10 point review of systems negative.    Past Medical History:  Diagnosis Date  . Cataract   . Diabetes mellitus   . Hyperlipidemia   . Hypertension   . Prostate cancer ([place])   . Renal insufficiency   CKD 3 History of VTE, prior  anticoagulation with warfarin but NOT currently  Past Surgical History:  Procedure Laterality Date  . PROSTATE SURGERY     radiation therapy only     reports that he quit smoking about 17 years ago. He quit after 10.00 years of use. He has never used smokeless tobacco. He reports that he does not drink alcohol or use drugs.  He is married.  He has two adult children.  No Known Allergies  Family History  Problem Relation Age of Onset  . Diabetes Father   . Diabetes Sister   . Hypertension Sister   . Hypertension Brother   . Heart disease Neg Hx      Prior to Admission medications   Medication Sig Start Date End Date Taking? Authorizing Provider  atenolol (TENORMIN) 100 MG tablet Take 50 mg by mouth daily.  02/09/13  Yes Historical Provider, MD  atorvastatin (LIPITOR) 10 MG tablet Take 1 tablet (10 mg total) by mouth daily after supper. 10/10/15  Yes Elayne Snare, MD  furosemide (LASIX) 40 MG tablet Take 40 mg by mouth daily.  01/11/16  Yes Historical Provider, MD  GLIPIZIDE XL 5 MG 24 hr tablet TAKE 1 TABLET BY MOUTH DAILY WITH BREAKFAST. Patient taking differently: TAKE 5mg  TABLET BY MOUTH DAILY WITH BREAKFAST. 11/24/15  Yes Elayne Snare, MD  pioglitazone (ACTOS) 30 MG tablet TAKE 1 TABLET BY MOUTH EVERY DAY Patient taking differently: TAKE 30mg  TABLET BY MOUTH  EVERY DAY 01/02/16  Yes Elayne Snare, MD  potassium chloride SA (KLOR-CON M20) 20 MEQ tablet Take 1 tablet (20 mEq total) by mouth daily. 09/01/15  Yes [name], FNP  TRADJENTA 5 MG TABS tablet TAKE 1 TABLET (5 MG TOTAL) BY MOUTH DAILY. 11/28/15  Yes Elayne Snare, MD  ZYTIGA 250 MG tablet TAKE 4 TABLETS (1,000MG ) BY MOUTH EVERY DAY ON AN EMPTY STOMACH 1 HOURBEFORE OR 2 HOURS AFTER A MEAL 01/10/16  Yes Wyatt Portela, MD  ONE TOUCH ULTRA TEST test strip USE TO TEST ONCE DAILY 08/29/15   Elayne Snare, MD  [facility] DELICA LANCETS [id number] MISC USE TO TEST ONCE DAILY Dx code E11.65 01/04/16   Elayne Snare, MD    Physical Exam: Vitals:    01/28/16 2235 01/28/16 2300 01/28/16 2330 01/29/16 0000  BP: 108/67 121/69 126/64 121/60  Pulse: 72 68 73 72  Resp: 18 19 16 21   Temp:      TempSrc:      SpO2: 99% 97% 98% 96%      Constitutional: NAD, calm, comfortable, NONtoxic appearing, keeps his eye closed through most of the interview Vitals:   01/28/16 2235 01/28/16 2300 01/28/16 2330 01/29/16 0000  BP: 108/67 121/69 126/64 121/60  Pulse: 72 68 73 72  Resp: 18 19 16 21   Temp:      TempSrc:      SpO2: 99% 97% 98% 96%   Eyes: right pupil is reactive, abnormal pupil on the left with history of cataract.  Lids and conjunctiva are normal. ENMT: Mucous membranes are moist. Posterior pharynx clear of any exudate or lesions. Normal dentition.  Neck: normal appearance, supple Respiratory: clear to auscultation listening anteriorly (patient says that he is too weak to roll over).  Normal respiratory effort. No accessory muscle use.  Cardiovascular: Normal rate, regular rhythm, no murmurs / rubs / gallops. No extremity edema. 2+ pedal pulses. GI: abdomen is soft and compressible.  No distention.  No tenderness.  Bowel sounds are present. Musculoskeletal:  No joint deformity in upper and lower extremities. Moves all four extremities spontaneously and against resistance.  Good ROM, no contractures. Normal muscle tone.  Skin: no rashes, warm and dry Neurologic: CN 2-12 grossly intact. Sensation intact, generalized weakness in all extremities; right leg may be slightly weaker than left.  Negative straight leg raises bilaterally. Psychiatric: Normal judgment and insight. Alert and oriented x 3. Normal mood.     Labs on Admission: I have personally reviewed following labs and imaging studies  CBC:  Recent Labs Lab 01/25/16 0912 01/28/16 1932  WBC 8.7 12.9*  NEUTROABS 6.8* 10.9*  HGB 9.2* 8.3*  HCT 29.2* 25.9*  MCV 90.4 89.9  PLT 279 0000000   Basic Metabolic Panel:  Recent Labs Lab 01/25/16 0912 01/28/16 1932  NA 146* 145  K  3.2* 3.6  CL  --  107  CO2 27 28  GLUCOSE 159* 147*  BUN 23.0 36*  CREATININE 2.8* 2.62*  CALCIUM 9.0 8.4*   GFR: Estimated Creatinine Clearance: 22.5 mL/min (by C-G formula based on SCr of 2.62 mg/dL (H)). Liver Function Tests:  Recent Labs Lab 01/25/16 0912 01/28/16 1932  AST 65* 25  ALT 29 18  ALKPHOS 140 92  BILITOT 0.53 0.9  PROT 6.3* 6.3*  ALBUMIN 2.7* 2.6*   Coagulation Profile:  Recent Labs Lab 01/28/16 2028  INR 1.28   Urine analysis:    Component Value Date/Time   COLORURINE AMBER (A) 01/28/2016 2102   APPEARANCEUR  CLOUDY (A) 01/28/2016 2102   LABSPEC 1.018 01/28/2016 2102   PHURINE 6.0 01/28/2016 2102   GLUCOSEU NEGATIVE 01/28/2016 2102   GLUCOSEU NEGATIVE 09/22/2014 0757   HGBUR NEGATIVE 01/28/2016 2102   BILIRUBINUR NEGATIVE 01/28/2016 2102   BILIRUBINUR n 04/30/2011 [place] 01/28/2016 2102   PROTEINUR 30 (A) 01/28/2016 2102   UROBILINOGEN 0.2 09/22/2014 0757   NITRITE NEGATIVE 01/28/2016 2102   LEUKOCYTESUR NEGATIVE 01/28/2016 2102    Radiological Exams on Admission: Dg Chest 2 View  Result Date: 01/28/2016 CLINICAL DATA:  Weakness with left neck pain and right shoulder pain. EXAM: CHEST  2 VIEW COMPARISON:  01/09/2016 and 08/20/2010 FINDINGS: Lungs are adequately inflated with hazy opacification over the posterior mid to lower lungs on the lateral film which may be retrocardiac on the frontal film. No evidence of effusion. Cardiomediastinal silhouette is within normal. There is mild degenerate change of the spine. IMPRESSION: Hazy opacification over the posterior mid to lower lungs on the lateral film possibly within the left lower lobe and may be due to pneumonia. Recommend follow-up chest radiograph in 3-4 weeks to document resolution. Electronically Signed   By: Marin Olp M.D.   On: 01/28/2016 20:15   Ct Head Wo Contrast  Result Date: 01/28/2016 CLINICAL DATA:  Recent fall with neck pain. History of metastatic prostate  cancer. EXAM: CT HEAD WITHOUT CONTRAST CT CERVICAL SPINE WITHOUT CONTRAST TECHNIQUE: Multidetector CT imaging of the head and cervical spine was performed following the standard protocol without intravenous contrast. Multiplanar CT image reconstructions of the cervical spine were also generated. COMPARISON:  None. FINDINGS: CT HEAD FINDINGS Brain: Ventricles, cisterns and other CSF spaces are within normal. There is no mass, mass effect, shift of midline structures or acute hemorrhage. There is no evidence of acute infarction. Minimal chronic ischemic microvascular disease. Vascular: Calcified plaque over the cavernous segment of the internal carotid arteries. Skull: Within normal. Sinuses/Orbits: Within normal. CT CERVICAL SPINE FINDINGS Alignment: Subtle 1-2 mm degenerative posterior subluxation of C3 on C4. Skull base and vertebrae: Mild spondylosis throughout the cervical spine. Mild uncovertebral joint spurring and facet arthropathy. Soft tissues and spinal canal: Within normal. Disc levels:  Moderate disc space narrowing at the C3-4 level. Upper chest: Within normal. Other: Sclerotic focus over the spinous process of C3 which may be due to bone island versus metastatic disease in this patient with known metastatic prostate cancer. Small rounded lucent lesion over the right side of the C5 vertebral body which may represent a small hemangioma and less likely metastatic focus. IMPRESSION: No acute intracranial findings. Minimal chronic ischemic microvascular disease. No acute cervical spine injury. Mild spondylosis throughout the cervical spine with disc disease at the C3-4 level. Sclerotic focus over the C3 spinous process which may be due to bone island versus metastatic disease in this patient with known metastatic prostate cancer. Electronically Signed   By: Marin Olp M.D.   On: 01/28/2016 21:23   Ct Cervical Spine Wo Contrast  Result Date: 01/28/2016 CLINICAL DATA:  Recent fall with neck pain.  History of metastatic prostate cancer. EXAM: CT HEAD WITHOUT CONTRAST CT CERVICAL SPINE WITHOUT CONTRAST TECHNIQUE: Multidetector CT imaging of the head and cervical spine was performed following the standard protocol without intravenous contrast. Multiplanar CT image reconstructions of the cervical spine were also generated. COMPARISON:  None. FINDINGS: CT HEAD FINDINGS Brain: Ventricles, cisterns and other CSF spaces are within normal. There is no mass, mass effect, shift of midline structures or acute hemorrhage. There  is no evidence of acute infarction. Minimal chronic ischemic microvascular disease. Vascular: Calcified plaque over the cavernous segment of the internal carotid arteries. Skull: Within normal. Sinuses/Orbits: Within normal. CT CERVICAL SPINE FINDINGS Alignment: Subtle 1-2 mm degenerative posterior subluxation of C3 on C4. Skull base and vertebrae: Mild spondylosis throughout the cervical spine. Mild uncovertebral joint spurring and facet arthropathy. Soft tissues and spinal canal: Within normal. Disc levels:  Moderate disc space narrowing at the C3-4 level. Upper chest: Within normal. Other: Sclerotic focus over the spinous process of C3 which may be due to bone island versus metastatic disease in this patient with known metastatic prostate cancer. Small rounded lucent lesion over the right side of the C5 vertebral body which may represent a small hemangioma and less likely metastatic focus. IMPRESSION: No acute intracranial findings. Minimal chronic ischemic microvascular disease. No acute cervical spine injury. Mild spondylosis throughout the cervical spine with disc disease at the C3-4 level. Sclerotic focus over the C3 spinous process which may be due to bone island versus metastatic disease in this patient with known metastatic prostate cancer. Electronically Signed   By: Marin Olp M.D.   On: 01/28/2016 21:23    EKG: Independently reviewed. NSR with PVCs.  No acute ST segment  elevations.  Assessment/Plan Principal Problem:   HCAP (healthcare-associated pneumonia) Active Problems:   Diabetes mellitus type 2 in nonobese [facility])   Essential hypertension   Acute kidney injury superimposed on chronic kidney disease (HCC)   Protein-calorie malnutrition, severe   Weakness   Right leg weakness     HCAP --Continue cefepime and vancomycin --Blood cultures --Supplemental oxygen as needed --RT with incentive spirometry --Urine legionella antigen  Generalized weakness, gait abnormality, decreased PO intake.  Highly suspicious that his oral chemo agent is contributing.  However, since the patient has a history of falls, I believe that it is reasonable to do additional imaging. --Xray lumbar spine; may need to consider MRI as well --Low index of suspicion for CVA at this point; head CT unremarkable and symptoms of generalized weakness have been progressive over a weak. --Hydrate with NS --PT and OT eval and treat.  Patient may need to consider SNF placement. --HOLD Zytiga for now.  Will need to discuss with Dr. Alen Blew on Monday. --Fall precautions  HTN --Continue beta blocker  Mild AKI on CKD 3 --Hydrate with NS --Hold lasix and potassium --Repeat BMP in the AM  DM --Hold oral meds.  SSI coverage AC/HS for now.  Follow trend.  History of prostate cancer --HOLD zytiga for now due to weakness --Prednison STOPPED earlier this month after presentation for hyperglycemia --Dr. Alen Blew follows in clinic   DVT prophylaxis: SCDs Code Status: FULL Family Communication: Wife present at bedside in the ED at time of admission Disposition Plan: To be determined Consults called: NONE Admission status: Inpatient, med surg   TIME SPENT: 86 minutes   Eber Jones MD [facility] Pager [phone]  If 7PM-7AM, please contact night-coverage [email or web address] Password [email or web address]  01/29/2016, 12:40 AM

## 2016-01-30 ENCOUNTER — Inpatient Hospital Stay (HOSPITAL_COMMUNITY): Payer: Medicare Other

## 2016-01-30 DIAGNOSIS — M7989 Other specified soft tissue disorders: Secondary | ICD-10-CM

## 2016-01-30 DIAGNOSIS — M79609 Pain in unspecified limb: Secondary | ICD-10-CM

## 2016-01-30 LAB — BASIC METABOLIC PANEL
Anion gap: 6 (ref 5–15)
BUN: 35 mg/dL — AB (ref 6–20)
CHLORIDE: 107 mmol/L (ref 101–111)
CO2: 29 mmol/L (ref 22–32)
CREATININE: 2.57 mg/dL — AB (ref 0.61–1.24)
Calcium: 8.2 mg/dL — ABNORMAL LOW (ref 8.9–10.3)
GFR calc Af Amer: 27 mL/min — ABNORMAL LOW (ref >60)
GFR calc non Af Amer: 23 mL/min — ABNORMAL LOW (ref >60)
Glucose, Bld: 95 mg/dL (ref 65–99)
Potassium: 3.6 mmol/L (ref 3.5–5.1)
Sodium: 142 mmol/L (ref 135–145)

## 2016-01-30 LAB — GLUCOSE, CAPILLARY
GLUCOSE-CAPILLARY: 207 mg/dL — AB (ref 65–99)
Glucose-Capillary: 112 mg/dL — ABNORMAL HIGH (ref 65–99)
Glucose-Capillary: 127 mg/dL — ABNORMAL HIGH (ref 65–99)
Glucose-Capillary: 154 mg/dL — ABNORMAL HIGH (ref 65–99)
Glucose-Capillary: 188 mg/dL — ABNORMAL HIGH (ref 65–99)
Glucose-Capillary: 99 mg/dL (ref 65–99)

## 2016-01-30 LAB — CBC
HEMATOCRIT: 23 % — AB (ref 39.0–52.0)
HEMOGLOBIN: 7.5 g/dL — AB (ref 13.0–17.0)
MCH: 28.5 pg (ref 26.0–34.0)
MCHC: 32.6 g/dL (ref 30.0–36.0)
MCV: 87.5 fL (ref 78.0–100.0)
Platelets: 227 10*3/uL (ref 150–400)
RBC: 2.63 MIL/uL — ABNORMAL LOW (ref 4.22–5.81)
RDW: 14.4 % (ref 11.5–15.5)
WBC: 10.1 10*3/uL (ref 4.0–10.5)

## 2016-01-30 LAB — CORTISOL-AM, BLOOD: Cortisol - AM: 7.6 ug/dL (ref 6.7–22.6)

## 2016-01-30 MED ORDER — LEVOFLOXACIN 750 MG PO TABS
750.0000 mg | ORAL_TABLET | ORAL | Status: DC
  Administered 2016-01-30 – 2016-02-01 (×2): 750 mg via ORAL
  Filled 2016-01-30 (×4): qty 1

## 2016-01-30 NOTE — Progress Notes (Signed)
*  Preliminary Results* Right lower extremity venous duplex completed. Right lower extremity is negative for acute deep vein thrombosis. 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 There is no evidence of right Baker's cyst.  01/30/2016 3:55 PM  Maudry Mayhew, BS, RVT, RDCS, RDMS

## 2016-01-30 NOTE — Progress Notes (Addendum)
PROGRESS NOTE  [name]  [id number] DOB: 1941-03-22 DOA: 01/28/2016 PCP: [name], FNP  Outpatient Specialists: Oncology, Dr. Alen Blew  Brief Narrative: [name] is a 75 y.o. gentleman with a history of castration-resistant metastatic prostate cancer, currently on Zytiga 500mg  daily under the direction of Dr. Alen Blew, who also had HTN, DM, and CKD 3.  He is accompanied by his wife, who reports one week of decreased appetite, poor oral intake, progressive weakness, and gait instability.  He has also had at least three falls.  He has had increased pain in his neck and left shoulder, and now he feels that he cannot ambulate normally, particularly with his right leg.  He has not taken any medications in an attempt to alleviate his pain.  He denies any head trauma or LOC.  He reports that he just developed a cough today.  No fever, sweats, or chills, but he has had hot flashes.  No chest pain but he has had some shortness of breath.  No nausea, vomiting, or abdominal pain.  ED Course: Chest xray concerning for LLL pneumonia.  CT of the head negative for acute process, but CT of the cervical spine shows mild spondylosis of the cervical spine with disc disease at C3-4 and a sclerotic focus over the C3 spinous process, which could represent metastatic disease.  The patient has received empiric vancomycin and cefepime, treating as HCAP, due to his recent hospitalization for hyperglycemia.  He has evidence of mild AKI on CKD 3.  He has a mild leukocytosis.  The patient is hemodynamically stable; lactic acid level was not checked.  Hospitalist asked to admit.   Assessment & Plan: Principal Problem:   HCAP (healthcare-associated pneumonia) Active Problems:   Diabetes mellitus type 2 in nonobese [facility])   Essential hypertension   Acute kidney injury superimposed on chronic kidney disease (HCC)   Protein-calorie malnutrition, severe   Weakness   Right leg weakness  HCAP --  cefepime and vancomycin >> levaquin q48h (renal impairment) -- Blood cultures NGTD -- If any respiratory distress develops consider V/Q scan to r/o recurrent PE (CKD, Cr >2) -- RT with incentive spirometry -- Urine legionella antigen  Generalized weakness, gait abnormality, decreased PO intake:  Highly suspicious that his oral chemo agent is contributing. CT head unremarkable. Anemia is chronic.  --Xray lumbar spine ok; No red flag exam findings. may need to consider MRI as well --Hydrate with NS --PT and OT eval and treat: Recommend SNF, SW consulted, anticipate D/C 10/24.  --HOLD Zytiga while inpatient. Will restart after discharge per Dr. Alen Blew --Fall precautions --Recheck CBC in AM. Hgb dropped 8.3 > 7.5 since admission and IVF's (all cell lines down). No bleeding noted.  HTN --Continue beta blocker   Mild AKI on CKD 3 --Hydrate with NS --Hold lasix and potassium --Repeat BMP in the AM  DM: HbA1c 7.1% in Sept 2017. Exacerbated by recent steroids, recent admission for hyperglycemia. Had mild hypoglycemia overnight, but in 200's today.  - Hold oral meds. Sensitive SSI coverage AC/HS  Secondary adrenal insufficiency: Took prednisone 5mg  with zytiga started 9/17 and had admission 10/1-10/4 for hyperglycemia, so this was discontinued. Of note, he had not been able to afford and therefore take, his diabetic medications.  - With hypoglycemia, cortisol was checked this AM, at the bottom end of normal. Suspect mildly impaired gluconeogenesis. Will not restart steroid taper.   History of prostate cancer: CT of the cervical spine showed a sclerotic focus over the C3  spinous process, which could represent metastatic disease.  --HOLD zytiga for now due to weakness --Prednison STOPPED earlier this month after presentation for hyperglycemia --Dr. Alen Blew follows in clinic: F/u Scheduled 11/22.   History of PE: 2012, treated with warfarin x1 year, stopped July 2013.   Right foot pain:  Refusing to bear weight. No trauma. 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   DVT prophylaxis: SCDs Code Status: Full Family Communication: Sister and brother at bedside Disposition Plan: SNF 10/24  Consultants:   Dr. Alen Blew by phone  Procedures:   None  Antimicrobials:  Vanc/cefepime 10/21 > 10/23  Levaquin 10/23 >    Subjective: Pt with right foot pain, no other complaints. No chest pain or dyspnea.   Objective: Vitals:   01/29/16 1254 01/29/16 2134 01/30/16 0500 01/30/16 0959  BP: 118/74 106/66 119/63 127/72  Pulse: 73  67 68  Resp: 18 18 18    Temp: 97.7 F (36.5 C) 98.5 F (36.9 C) 98.3 F (36.8 C)   TempSrc: Oral Oral Oral   SpO2: 100% 100% 94%   Weight:      Height:        Intake/Output Summary (Last 24 hours) at 01/30/16 1504 Last data filed at 01/30/16 1123  Gross per 24 hour  Intake             1520 ml  Output              400 ml  Net             1120 ml   Filed Weights   01/29/16 0135  Weight: 65 kg (143 lb 4.8 oz)    Examination: General exam: 75 y.o. male in no distress Respiratory system: Non-labored breathing. Clear to auscultation bilaterally.  Cardiovascular system: Regular rate and rhythm. No murmur, rub, or gallop. No JVD Gastrointestinal system: Abdomen soft, non-tender, non-distended, with normoactive bowel sounds. No organomegaly or masses felt. Central nervous system: Alert and oriented. No focal neurological deficits. Extremities: Swollen RLE to below the knee involving the foot with point tenderness over 1st MTP. Full ankle ROM. +homan's. LLE with less edema. Skin: No rashes, lesions no ulcers Psychiatry: Judgement and insight appear normal. Mood & affect appropriate.   Data Reviewed: I have personally reviewed following labs and imaging studies  CBC:  Recent Labs Lab 01/25/16 0912 01/28/16 1932 01/29/16 0145 01/30/16 0637  WBC 8.7  12.9* 12.4* 10.1  NEUTROABS 6.8* 10.9*  --   --   HGB 9.2* 8.3* 8.3* 7.5*  HCT 29.2* 25.9* 25.8* 23.0*  MCV 90.4 89.9 89.9 87.5  PLT 279 233 235 [id number]   Basic Metabolic Panel:  Recent Labs Lab 01/25/16 0912 01/28/16 1932 01/29/16 0145 01/29/16 1942 01/30/16 0637  NA 146* 145 143  --  142  K 3.2* 3.6 3.4*  --  3.6  CL  --  107 107  --  107  CO2 27 28 29   --  29  GLUCOSE 159* 147* 138* 215* 95  BUN 23.0 36* 34*  --  35*  CREATININE 2.8* 2.62* 2.54*  --  2.57*  CALCIUM 9.0 8.4* 8.3*  --  8.2*   GFR: Estimated Creatinine Clearance: 23.2 mL/min (by C-G formula based on SCr of 2.57 mg/dL (H)). Liver Function Tests:  Recent Labs Lab 01/25/16 0912 01/28/16 1932  AST 65* 25  ALT 29 18  ALKPHOS 140 92  BILITOT 0.53 0.9  PROT 6.3* 6.3*  ALBUMIN 2.7* 2.6*   No results for input(s): LIPASE, AMYLASE in the last 168 hours. No results for input(s): AMMONIA in the last 168 hours. Coagulation Profile:  Recent Labs Lab 01/28/16 2028  INR 1.28   Cardiac Enzymes: No results for input(s): CKTOTAL, CKMB, CKMBINDEX, TROPONINI in the last 168 hours. BNP (last 3 results) No results for input(s): PROBNP in the last 8760 hours. HbA1C: No results for input(s): HGBA1C in the last 72 hours. CBG:  Recent Labs Lab 01/29/16 2137 01/30/16 0009 01/30/16 0528 01/30/16 0743 01/30/16 1203  GLUCAP 243* 188* 112* 127* 99   Lipid Profile: No results for input(s): CHOL, HDL, LDLCALC, TRIG, CHOLHDL, LDLDIRECT in the last 72 hours. Thyroid Function Tests: No results for input(s): TSH, T4TOTAL, FREET4, T3FREE, THYROIDAB in the last 72 hours. Anemia Panel: No results for input(s): VITAMINB12, FOLATE, FERRITIN, TIBC, IRON, RETICCTPCT in the last 72 hours. Urine analysis:    Component Value Date/Time   COLORURINE AMBER (A) 01/28/2016 2102   APPEARANCEUR CLOUDY (A) 01/28/2016 2102   LABSPEC 1.018 01/28/2016 2102   PHURINE 6.0 01/28/2016 2102   GLUCOSEU NEGATIVE 01/28/2016 2102   GLUCOSEU  NEGATIVE 09/22/2014 0757   HGBUR NEGATIVE 01/28/2016 2102   BILIRUBINUR NEGATIVE 01/28/2016 2102   BILIRUBINUR n 04/30/2011 [place] NEGATIVE 01/28/2016 2102   PROTEINUR 30 (A) 01/28/2016 2102   UROBILINOGEN 0.2 09/22/2014 0757   NITRITE NEGATIVE 01/28/2016 2102   LEUKOCYTESUR NEGATIVE 01/28/2016 2102   Sepsis Labs: @LABRCNTIP (procalcitonin:4,lacticidven:4)  ) Recent Results (from the past 240 hour(s))  MRSA PCR Screening     Status: None   Collection Time: 01/29/16  2:36 PM  Result Value Ref Range Status   MRSA by PCR NEGATIVE NEGATIVE Final    Comment:        The GeneXpert MRSA Assay (FDA approved for NASAL specimens only), is one component of a comprehensive MRSA colonization surveillance program. It is not intended to diagnose MRSA infection nor to guide or monitor treatment for MRSA infections.      Radiology Studies: Dg Chest 2 View  Result Date: 01/28/2016 CLINICAL DATA:  Weakness with left neck pain and right shoulder pain. EXAM: CHEST  2 VIEW COMPARISON:  01/09/2016 and 08/20/2010 FINDINGS: Lungs are adequately inflated with hazy opacification over the posterior mid to lower lungs on the lateral film which may be retrocardiac on the frontal film. No evidence of effusion. Cardiomediastinal silhouette is within normal. There is mild degenerate change of the spine. IMPRESSION: Hazy opacification over the posterior mid to lower lungs on the lateral film possibly within the left lower lobe and may be due to pneumonia. Recommend follow-up chest radiograph in 3-4 weeks to document resolution. Electronically Signed   By: Marin Olp M.D.   On: 01/28/2016 20:15   Dg Lumbar Spine Complete  Result Date: 01/29/2016 CLINICAL DATA:  Acute low back pain, weakness, history of prostate cancer and multiple falls. EXAM: LUMBAR SPINE - COMPLETE 4+ VIEW COMPARISON:  11/07/2015 CT reconstructions. FINDINGS: Mild diffuse osteopenia and endplate degenerative changes. Degenerative  disc disease most pronounced at L5-S1 with disc space narrowing, sclerosis and osteophytes. No acute compression fracture, wedge-shaped deformity or focal kyphosis. Facets are aligned. No pars defects. Normal appearing pedicles. SI joint arthropathy present bilaterally. Prostate calcifications noted. Normal bowel gas pattern. IMPRESSION: Degenerative lumbar spondylosis as above. No acute osseous finding by plain radiography Electronically Signed   By: Jerilynn Mages.  Shick M.D.  On: 01/29/2016 11:36   Ct Head Wo Contrast  Result Date: 01/28/2016 CLINICAL DATA:  Recent fall with neck pain. History of metastatic prostate cancer. EXAM: CT HEAD WITHOUT CONTRAST CT CERVICAL SPINE WITHOUT CONTRAST TECHNIQUE: Multidetector CT imaging of the head and cervical spine was performed following the standard protocol without intravenous contrast. Multiplanar CT image reconstructions of the cervical spine were also generated. COMPARISON:  None. FINDINGS: CT HEAD FINDINGS Brain: Ventricles, cisterns and other CSF spaces are within normal. There is no mass, mass effect, shift of midline structures or acute hemorrhage. There is no evidence of acute infarction. Minimal chronic ischemic microvascular disease. Vascular: Calcified plaque over the cavernous segment of the internal carotid arteries. Skull: Within normal. Sinuses/Orbits: Within normal. CT CERVICAL SPINE FINDINGS Alignment: Subtle 1-2 mm degenerative posterior subluxation of C3 on C4. Skull base and vertebrae: Mild spondylosis throughout the cervical spine. Mild uncovertebral joint spurring and facet arthropathy. Soft tissues and spinal canal: Within normal. Disc levels:  Moderate disc space narrowing at the C3-4 level. Upper chest: Within normal. Other: Sclerotic focus over the spinous process of C3 which may be due to bone island versus metastatic disease in this patient with known metastatic prostate cancer. Small rounded lucent lesion over the right side of the C5 vertebral  body which may represent a small hemangioma and less likely metastatic focus. IMPRESSION: No acute intracranial findings. Minimal chronic ischemic microvascular disease. No acute cervical spine injury. Mild spondylosis throughout the cervical spine with disc disease at the C3-4 level. Sclerotic focus over the C3 spinous process which may be due to bone island versus metastatic disease in this patient with known metastatic prostate cancer. Electronically Signed   By: Marin Olp M.D.   On: 01/28/2016 21:23   Ct Cervical Spine Wo Contrast  Result Date: 01/28/2016 CLINICAL DATA:  Recent fall with neck pain. History of metastatic prostate cancer. EXAM: CT HEAD WITHOUT CONTRAST CT CERVICAL SPINE WITHOUT CONTRAST TECHNIQUE: Multidetector CT imaging of the head and cervical spine was performed following the standard protocol without intravenous contrast. Multiplanar CT image reconstructions of the cervical spine were also generated. COMPARISON:  None. FINDINGS: CT HEAD FINDINGS Brain: Ventricles, cisterns and other CSF spaces are within normal. There is no mass, mass effect, shift of midline structures or acute hemorrhage. There is no evidence of acute infarction. Minimal chronic ischemic microvascular disease. Vascular: Calcified plaque over the cavernous segment of the internal carotid arteries. Skull: Within normal. Sinuses/Orbits: Within normal. CT CERVICAL SPINE FINDINGS Alignment: Subtle 1-2 mm degenerative posterior subluxation of C3 on C4. Skull base and vertebrae: Mild spondylosis throughout the cervical spine. Mild uncovertebral joint spurring and facet arthropathy. Soft tissues and spinal canal: Within normal. Disc levels:  Moderate disc space narrowing at the C3-4 level. Upper chest: Within normal. Other: Sclerotic focus over the spinous process of C3 which may be due to bone island versus metastatic disease in this patient with known metastatic prostate cancer. Small rounded lucent lesion over the right  side of the C5 vertebral body which may represent a small hemangioma and less likely metastatic focus. IMPRESSION: No acute intracranial findings. Minimal chronic ischemic microvascular disease. No acute cervical spine injury. Mild spondylosis throughout the cervical spine with disc disease at the C3-4 level. Sclerotic focus over the C3 spinous process which may be due to bone island versus metastatic disease in this patient with known metastatic prostate cancer. Electronically Signed   By: Marin Olp M.D.   On: 01/28/2016 21:23   Dg Foot  Complete Right  Result Date: 01/30/2016 CLINICAL DATA:  75 year old male with generalize right-sided foot pain. No history of injury. Swelling in the foot. EXAM: RIGHT FOOT COMPLETE - 3+ VIEW COMPARISON:  None. FINDINGS: No acute displaced fracture, subluxation or dislocation. There is advanced joint space narrowing, subchondral sclerosis, subchondral cyst formation and osteophyte formation at the first MTP joint, compatible with severe osteoarthritis. Small plantar calcaneal spur incidentally noted. IMPRESSION: 1. No acute radiographic abnormality of the right foot. 2. 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 Electronically Signed   By: Vinnie Langton M.D.   On: 01/30/2016 13:47    Scheduled Meds: . atenolol  50 mg Oral Daily  . atorvastatin  10 mg Oral QPC supper  . heparin subcutaneous  5,000 Units Subcutaneous Q8H  . insulin aspart  0-5 Units Subcutaneous QHS  . insulin aspart  0-9 Units Subcutaneous TID WC  . levofloxacin  750 mg Oral Q48H   Continuous Infusions:    LOS: 2 days   Time spent: 25 minutes.  Vance Gather, MD [facility] Pager [phone]  If 7PM-7AM, please contact night-coverage [email or web address] Password [email or web address] 01/30/2016, 3:04 PM

## 2016-01-30 NOTE — Clinical Social Work Placement (Signed)
   CLINICAL SOCIAL WORK PLACEMENT  NOTE  Date:  01/30/2016  Patient Details  Name: [name] MRN: [id number] Date of Birth: 1940-07-19  Clinical Social Work is seeking post-discharge placement for this patient at the [place] level of care (*CSW will initial, date and re-position this form in  chart as items are completed):  Yes   Patient/family provided with [facility]'s list of facilities offering this level of care within the geographic area requested by the patient (or if unable, by the patient's family).  Yes   Patient/family informed of their freedom to choose among providers that offer the needed level of care, that participate in Medicare, Medicaid or managed care program needed by the patient, have an available bed and are willing to accept the patient.  Yes   Patient/family informed of 's ownership interest in [facility] and [facility], as well as of the fact that they are under no obligation to receive care at these facilities.  PASRR submitted to EDS on 01/30/16     PASRR number received on 01/30/16     Existing PASRR number confirmed on       FL2 transmitted to all facilities in geographic area requested by pt/family on 01/30/16     FL2 transmitted to all facilities within larger geographic area on       Patient informed that his/her managed care company has contracts with or will negotiate with certain facilities, including the following:        Yes   Patient/family informed of bed offers received.  Patient chooses bed at [facility]     Physician recommends and patient chooses bed at      Patient to be transferred to [facility] on  .  Patient to be transferred to facility by       Patient family notified on   of transfer.  Name of family member notified:        PHYSICIAN       Additional Comment:     _______________________________________________ Luretha Rued, [place] 01/30/2016, 2:00 PM

## 2016-01-30 NOTE — Progress Notes (Addendum)
Physical Therapy Treatment Patient Details Name: [name] MRN: [id number] DOB: Aug 15, 1940 Today's Date: 01/30/2016    History of Present Illness Pt is a 75 y/o male with PMHx of prostate cancer, HTN, CKD, DVT, HLD, DM, and recent L wrist fx admitted with recent falls, weakness, and pain. Dx of PNA, AKI.     PT Comments    Progress with mobility limited by painful, swollen R foot. He has difficulty weightbearing on RLE in standing with RW. Difficult to get clear information about location, intensity, and aggravating factors as pt is vague historian. However, he guards R foot, moves RLE slowly and is hesitant to weight bear on RLE. Attempted sit to stand x 3 today, with 1 successful rise to stand. He was unable to ambulate due to RLE pain. Performed seated BLE exercises. Recommend further assessment of R ankle/foot pain.    Follow Up Recommendations  SNF (pt requires assist for transfers and ambulation)     Equipment Recommendations  Rolling walker with 5" wheels    Recommendations for Other Services       Precautions / Restrictions Precautions Precautions: Fall Precaution Comments: several falls at home per family (pt denies) Restrictions Weight Bearing Restrictions: No    Mobility  Bed Mobility               General bed mobility comments: NT -up in chair  Transfers Overall transfer level: Needs assistance Equipment used: Rolling walker (2 wheeled) Transfers: Sit to/from Stand Sit to Stand: Max assist         General transfer comment: max assist for sit to stand from recliner (x 3 attempts, pt unable to stand pushing up from B armrests, able to stand with "bear hug" technique", VCs for hand placement, blocked RLE as pt extended it out in front of him during set up. Pt stood with RW x 90 sec but unable to fully WB thru RLE 2* foot pain. Pt gives vague/indirect responses when asked about intensity and location of pain.   Ambulation/Gait     Assistive  device: Rolling walker (2 wheeled)       General Gait Details: unable due to pain R foot with weightbearing   Stairs            Wheelchair Mobility    Modified Rankin (Stroke Patients Only)       Balance     Sitting balance-Leahy Scale: Good     Standing balance support: Bilateral upper extremity supported Standing balance-Leahy Scale: Poor                      Cognition Arousal/Alertness: Awake/alert Behavior During Therapy: WFL for tasks assessed/performed Overall Cognitive Status: Different from baseline (pt poor historian, stated he can bathe himself at home but family stated this isn't true, family stated pt has been mildly confused 3-4 weeks)       Memory: Decreased short-term memory              Exercises General Exercises - Lower Extremity Ankle Circles/Pumps: AROM;Both;15 reps;Seated Long Arc Quad: AROM;10 reps;Left;Seated (x 5 on R limited by pain) Hip Flexion/Marching: AROM;Both;10 reps;Seated    General Comments        Pertinent Vitals/Pain Pain Assessment: Faces Faces Pain Scale: Hurts even more Pain Location: R foot with weightbearing Pain Descriptors / Indicators: Sore Pain Intervention(s): Limited activity within patient's tolerance;Monitored during session    Home Living  Prior Function            PT Goals (current goals can now be found in the care plan section) Acute Rehab PT Goals Patient Stated Goal: to get stronger PT Goal Formulation: With patient/family Time For Goal Achievement: 02/12/16 Potential to Achieve Goals: Fair Progress towards PT goals: Progressing toward goals    Frequency    Min 3X/week      PT Plan Current plan remains appropriate    Co-evaluation             End of Session Equipment Utilized During Treatment: Gait belt Activity Tolerance: Patient tolerated treatment well Patient left: in chair;with call bell/phone within reach;with chair alarm  set;with family/visitor present RN notified of R foot pain/swelling    Time: 1010-1041 PT Time Calculation (min) (ACUTE ONLY): 31 min  Charges:  $Therapeutic Exercise: 8-22 mins $Therapeutic Activity: 8-22 mins                    G Codes:      Philomena Doheny 01/30/2016, 10:51 AM [phone]

## 2016-01-30 NOTE — Clinical Social Work Note (Addendum)
Clinical Social Work Assessment  Patient Details  Name: [name] MRN: [id number] Date of Birth: 1940-12-17  Date of referral:  01/30/16               Reason for consult:  Discharge Planning                Permission sought to share information with:  Chartered certified accountant granted to share information::  Yes, Verbal Permission Granted  Name::        Agency::     Relationship::     Contact Information:     Housing/Transportation Living arrangements for the past 2 months:  Single Family Home Source of Information:  Brother Patient Interpreter Needed:  None Criminal Activity/Legal Involvement Pertinent to Current Situation/Hospitalization:  No - Comment as needed Significant Relationships:  Adult Children, Spouse, siblings Lives with:  Spouse Do you feel safe going back to the place where you live?   (SNF recommended.) Need for family participation in patient care:  Yes (Comment)  Care giving concerns: Pt's care cannot be managed at home following hospital d/c.   Social Worker assessment / plan: Pt hospitalized on 01/28/16 with HCAP from home. CSW met with pt / brother to assist with d/c planning. PT has recommended SNF placement at d/c. Pt / brother are in agreement with this plan and SNF search has been initiated and bed offers provided. Pt / brother have chosen [facility] for placement. SNF is able to accept pt when stable for d/c. CSW will continue to follow to assist with d/c planning to SNF.  Employment status:  Retired Forensic scientist:  Medicare PT Recommendations:  [place] / Referral to community resources:  [place] Patient/Family's Response to care: Pt / brother are in agreement with plan for [employer].  Patient/Family's Understanding of and Emotional Response to Diagnosis, Current Treatment, and Prognosis: Pt / brother are aware of pt's medical status. " I want to go home from here. My wife  would like me to go from here. I know I need rehab so I'll do what I have to do. " Support / reassurance provided.  Emotional Assessment Appearance:    Attitude/Demeanor/Rapport:  Other (cooperative) Affect (typically observed):  Calm, Accepting, Appropriate Orientation:  Oriented to Self, Oriented to Place, Oriented to Situation, Oriented to  Time Alcohol / Substance use:  Not Applicable Psych involvement (Current and /or in the community):  No (Comment)  Discharge Needs  Concerns to be addressed:  Discharge Planning Concerns Readmission within the last 30 days:  No Current discharge risk:  None Barriers to Discharge:  No Barriers Identified   Luretha Rued, [place] 01/30/2016, 1:49 PM

## 2016-01-30 NOTE — Progress Notes (Signed)
OT Cancellation Note  Patient Details Name: [name] MRN: [id number] DOB: 1940/05/07   Cancelled Treatment:    Noted plan for SNF- will defer OT eval to SNF.   Kari Baars, [place] Payton Mccallum D 01/30/2016, 3:26 PM

## 2016-01-30 NOTE — NC FL2 (Signed)
[place] LEVEL OF CARE SCREENING TOOL     IDENTIFICATION  Patient Name: [name] Birthdate: 1940-12-15 Sex: male Admission Date (Current Location): 01/28/2016  [facility] and [place] Number:  Herbalist and Address:  [facility],  [place] [address] [place]      Provider Number: [id number]  Attending Physician Name and Address:  Patrecia Pour, MD  Relative Name and Phone Number:       Current Level of Care: SNF Recommended Level of Care: [place] Prior Approval Number:    Date Approved/Denied:   PASRR Number: [id number]  Discharge Plan: SNF    Current Diagnoses: Patient Active Problem List   Diagnosis Date Noted  . Weakness 01/29/2016  . Right leg weakness 01/29/2016  . HCAP (healthcare-associated pneumonia) 01/28/2016  . Dehydration   . Hypomagnesemia   . Protein-calorie malnutrition, severe 01/09/2016  . Hyperglycemia 01/08/2016  . Hypokalemia 01/08/2016  . Acute kidney injury superimposed on chronic kidney disease ([place]) 01/08/2016  . Prolonged Q-T interval on ECG 01/08/2016  . 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 Bilateral lower extremity edema 09/07/2013  . Type II or unspecified type diabetes mellitus without mention of complication, uncontrolled 05/20/2013  . Pulmonary embolus ([place]) 09/01/2010  . Diabetes mellitus type 2 in nonobese ([place]) 09/15/2006  . Hyperlipidemia associated with type 2 diabetes mellitus ([place]) 09/13/2006  . Essential hypertension 09/13/2006  . Disorder resulting from impaired renal function 09/13/2006  . PROSTATE CANCER, HX OF 09/13/2006    Orientation RESPIRATION BLADDER Height & Weight     Self, Time, Situation, Place  O2 Continent Weight: 143 lb 4.8 oz (65 kg) Height:  5\' 8"  (172.7 cm)  BEHAVIORAL SYMPTOMS/MOOD NEUROLOGICAL BOWEL NUTRITION STATUS      Continent    AMBULATORY STATUS COMMUNICATION OF NEEDS Skin   Extensive Assist Verbally Normal                     Personal Care Assistance Level of Assistance  Feeding, Dressing   Feeding assistance: Limited assistance Dressing Assistance: Limited assistance     Functional Limitations Info  Sight, Hearing, Speech Sight Info: Adequate Hearing Info: Adequate Speech Info: Adequate    SPECIAL CARE FACTORS FREQUENCY  PT (By licensed PT), OT (By licensed OT)                    Contractures      Additional Factors Info  Code Status Code Status Info: FULL CODE.             Current Medications (01/30/2016):  This is the current hospital active medication list Current Facility-Administered Medications  Medication Dose Route Frequency Provider Last Rate Last Dose  . acetaminophen (TYLENOL) tablet 650 mg  650 mg Oral Q6H PRN Lily Kocher, MD      . atenolol (TENORMIN) tablet 50 mg  50 mg Oral Daily Lily Kocher, MD   50 mg at 01/30/16 0959  . atorvastatin (LIPITOR) tablet 10 mg  10 mg Oral QPC supper Lily Kocher, MD   10 mg at 01/29/16 1727  . ceFEPIme (MAXIPIME) 1 g in dextrose 5 % 50 mL IVPB  1 g Intravenous Q24H Leann T Poindexter, RPH 100 mL/hr at 01/29/16 2225 1 g at 01/29/16 2225  . heparin injection 5,000 Units  5,000 Units Subcutaneous Q8H Patrecia Pour, MD   5,000 Units at 01/30/16 0543  . HYDROcodone-acetaminophen (NORCO/VICODIN) 5-325 MG per tablet 1 tablet  1 tablet Oral Q4H PRN Lily Kocher, MD      . insulin aspart (novoLOG) injection 0-5 Units  0-5 Units Subcutaneous QHS Lily Kocher, MD   2 Units at 01/29/16 2226  . insulin aspart (novoLOG) injection 0-9 Units  0-9 Units Subcutaneous TID WC Lily Kocher, MD   1 Units at 01/30/16 0825     Discharge Medications: Please see discharge summary for a list of discharge medications.  Relevant Imaging Results:  Relevant Lab Results:   Additional Information SS#: [id number]  [place], [place], [place]

## 2016-01-31 LAB — BASIC METABOLIC PANEL
ANION GAP: 9 (ref 5–15)
BUN: 41 mg/dL — ABNORMAL HIGH (ref 6–20)
CHLORIDE: 106 mmol/L (ref 101–111)
CO2: 25 mmol/L (ref 22–32)
CREATININE: 2.91 mg/dL — AB (ref 0.61–1.24)
Calcium: 7.8 mg/dL — ABNORMAL LOW (ref 8.9–10.3)
GFR calc non Af Amer: 20 mL/min — ABNORMAL LOW (ref >60)
GFR, EST AFRICAN AMERICAN: 23 mL/min — AB (ref >60)
Glucose, Bld: 210 mg/dL — ABNORMAL HIGH (ref 65–99)
POTASSIUM: 3.8 mmol/L (ref 3.5–5.1)
SODIUM: 140 mmol/L (ref 135–145)

## 2016-01-31 LAB — GLUCOSE, CAPILLARY
GLUCOSE-CAPILLARY: 182 mg/dL — AB (ref 65–99)
GLUCOSE-CAPILLARY: 106 mg/dL — AB (ref 65–99)
Glucose-Capillary: 168 mg/dL — ABNORMAL HIGH (ref 65–99)
Glucose-Capillary: 132 mg/dL — ABNORMAL HIGH (ref 65–99)

## 2016-01-31 LAB — CBC
HEMATOCRIT: 20.9 % — AB (ref 39.0–52.0)
HEMOGLOBIN: 6.8 g/dL — AB (ref 13.0–17.0)
MCH: 28.7 pg (ref 26.0–34.0)
MCHC: 32.5 g/dL (ref 30.0–36.0)
MCV: 88.2 fL (ref 78.0–100.0)
Platelets: 235 10*3/uL (ref 150–400)
RBC: 2.37 MIL/uL — AB (ref 4.22–5.81)
RDW: 14.5 % (ref 11.5–15.5)
WBC: 11.4 10*3/uL — AB (ref 4.0–10.5)

## 2016-01-31 LAB — PREPARE RBC (CROSSMATCH)

## 2016-01-31 LAB — HEMOGLOBIN AND HEMATOCRIT, BLOOD
HEMATOCRIT: 26.1 % — AB (ref 39.0–52.0)
HEMOGLOBIN: 8.7 g/dL — AB (ref 13.0–17.0)

## 2016-01-31 LAB — ABO/RH: ABO/RH(D): B POS

## 2016-01-31 MED ORDER — ENOXAPARIN SODIUM 60 MG/0.6ML ~~LOC~~ SOLN
60.0000 mg | SUBCUTANEOUS | Status: DC
  Administered 2016-01-31 – 2016-02-01 (×2): 60 mg via SUBCUTANEOUS
  Filled 2016-01-31 (×2): qty 0.6

## 2016-01-31 MED ORDER — SODIUM CHLORIDE 0.9 % IV SOLN
Freq: Once | INTRAVENOUS | Status: AC
  Administered 2016-01-31: 09:00:00 via INTRAVENOUS

## 2016-01-31 NOTE — Significant Event (Signed)
CRITICAL VALUE ALERT  Critical value received:  Hemoglobin 6.8  Date of notification:  01/31/2016  Time of notification:  0419  Critical value read back:yes  Nurse who received alert:  Dorothyann Peng Dayshaun Whobrey,rn  MD notified (1st page):  Belenda Cruise schorr,  Time of first page:  0421  MD notified (2nd page):  Time of second page:  Responding MD:  Fransisca Connors  Time MD responded:  [phone]

## 2016-01-31 NOTE — Progress Notes (Signed)
Nursing Note: At bedside to obtain consent for blood.Pt says he intends to receive the blood but wants to wait till 0600 and talk with his wife before signing the consent for blood.wbb

## 2016-01-31 NOTE — Consult Note (Signed)
   [facility] CM Inpatient Consult   01/31/2016  [name] [birth year] [id number]    Patient screened for potential [facility] Care Management services. Chart reviewed. Noted current discharge plan is for  SNF.  There are no identifiable [facility] Care Management needs at this time. If patient's post hospital needs change, please place a [facility] Care Management consult. For questions please contact:  Marthenia Rolling, [place], RN,BSN [facility] Liaison [phone]

## 2016-01-31 NOTE — Care Management Note (Signed)
Case Management Note  Patient Details  Name: [name] MRN: [id number] Date of Birth: 04/22/1940  Subjective/Objective:      75 yo admitted with HCAP.              Action/Plan: From home with spouse. Pt for SNF at discharge.   Expected Discharge Date:  02/03/16               Expected Discharge Plan:  Skilled Nursing Facility  In-House Referral:  Clinical Social Work  Discharge planning Services  CM Consult  Post Acute Care Choice:    Choice offered to:     DME Arranged:    DME Agency:     HH Arranged:    [place] Agency:     Status of Service:  In process, will continue to follow  If discussed at Long Length of Stay Meetings, dates discussed:    Additional CommentsLynnell Catalan, RN 01/31/2016, 10:43 AM  [phone]

## 2016-01-31 NOTE — Progress Notes (Signed)
PROGRESS NOTE  [name]  [id number] DOB: March 28, 1941 DOA: 01/28/2016 PCP: [name], FNP  Outpatient Specialists: Oncology, Dr. Alen Blew  Brief Narrative: [name] is a 75 y.o. gentleman with a history of castration-resistant metastatic prostate cancer, currently on Zytiga 500mg  daily under the direction of Dr. Alen Blew, who also had HTN, DM, and CKD 3.  He was accompanied by his wife, who reported one week of decreased appetite, poor oral intake, progressive weakness, and gait instability.  He has also had at least three falls.  He has had increased pain and he cannot ambulate normally, particularly with his right leg. He had developed a cough on day of admission.  No fever, sweats, or chills, but he has had hot flashes.  No chest pain but he has had some shortness of breath.  No nausea, vomiting, or abdominal pain.  ED Course: Chest xray concerning for LLL pneumonia.  CT of the head negative for acute process, but CT of the cervical spine shows mild spondylosis of the cervical spine with disc disease at C3-4 and a sclerotic focus over the C3 spinous process, which could represent metastatic disease. He received empiric vancomycin and cefepime, treating as HCAP, due to his recent hospitalization for hyperglycemia.  He had evidence of mild AKI on CKD 3.  He was admitted to [name] service. RLE swelling and pain prompted doppler showing chronic clot. Eliquis was started with renal dosing. Hgb has continued to drop slowly to 6.8 on 10/24 in absence of evident bleeding. Transfusion was given.   Assessment & Plan: Principal Problem:   HCAP (healthcare-associated pneumonia) Active Problems:   Diabetes mellitus type 2 in nonobese [facility])   Essential hypertension   Acute kidney injury superimposed on chronic kidney disease ([place])   Protein-calorie malnutrition, severe   Weakness   Right leg weakness  Right lower extremity chronic DVT: History of PE 2012, treated with warfarin x1  year, stopped July 2013.  - Start eliquis per pharmacy  Normocytic, normochromic anemia: Acute on chronic, possibly dilutional with no evidence of bleeding - Transfuse prn hgb < 7 - Recheck H/H after transfusions today and in AM - FOBT  HCAP -- cefepime and vancomycin >> levaquin q48h (renal impairment) -- Blood cultures NGTD -- If any respiratory distress develops consider V/Q scan to r/o recurrent PE (CKD, Cr >2)  Generalized weakness, gait abnormality, decreased PO intake:  Highly suspicious that his oral chemo agent is contributing. CT head unremarkable. Anemia is chronic and there is no obvious bleeding.  --Xray lumbar spine ok; No red flag exam findings. may need to consider MRI as well --Hydrate with NS --PT and OT eval and treat: Recommend SNF, SW consulted, anticipate D/C 10/24.  --HOLD Zytiga while inpatient. Will restart after discharge per Dr. Alen Blew --Fall precautions --Recheck CBC in AM. Hgb dropped 8.3 > 7.5 since admission and IVF's (all cell lines down). No bleeding noted.  HTN --Continue beta blocker   Mild AKI on CKD 3 --Hydrate with NS --Hold lasix and potassium --Repeat BMP in the AM  DM: HbA1c 7.1% in Sept 2017. Exacerbated by recent steroids, recent admission for hyperglycemia. Had mild hypoglycemia overnight, but in 200's today.  - Hold oral meds. Sensitive SSI coverage AC/HS  Secondary adrenal insufficiency: Took prednisone 5mg  with zytiga started 9/17 and had admission 10/1-10/4 for hyperglycemia, so this was discontinued. Of note, he had not been able to afford and therefore take, his diabetic medications.  - With hypoglycemia, cortisol was checked this AM,  at the bottom end of normal. Suspect mildly impaired gluconeogenesis. Will not restart steroid taper.   History of prostate cancer: CT of the cervical spine showed a sclerotic focus over the C3 spinous process, which could represent metastatic disease.  --HOLD zytiga for now due to  weakness --Prednison STOPPED earlier this month after presentation for hyperglycemia --Dr. Alen Blew follows in clinic: F/u Scheduled 11/22.   DVT prophylaxis: Eliquis Code Status: Full Family Communication: Sister and brother at bedside Disposition Plan: SNF when hgb stable  Consultants:   Dr. Alen Blew by phone  Procedures:   Right lower extremity venous duplex 10/23:  - Right lower extremity is negative for acute deep vein thrombosis.  - Chronic appearing thrombus visualized in the right common femoral and popliteal veins.  Antimicrobials:  Vanc/cefepime 10/21 > 10/23  Levaquin 10/23 >    Subjective: Pt without complaints today. No chest pain or dyspnea.   Objective: Vitals:   01/31/16 1023 01/31/16 1053 01/31/16 1116 01/31/16 1311  BP: 115/65 113/67 (!) 105/55 (!) 105/59  Pulse: 79 80 78 77  Resp: 20 20 20 20   Temp: 97.7 F (36.5 C) 98 F (36.7 C) 98.5 F (36.9 C) 99.1 F (37.3 C)  TempSrc: Oral Oral Oral Oral  SpO2: 100% 98% 100% 100%  Weight:      Height:        Intake/Output Summary (Last 24 hours) at 01/31/16 1411 Last data filed at 01/31/16 1311  Gross per 24 hour  Intake              335 ml  Output              300 ml  Net               35 ml   Filed Weights   01/29/16 0135  Weight: 65 kg (143 lb 4.8 oz)    Examination: General exam: 75 y.o. male in no distress Respiratory system: Non-labored breathing. Clear to auscultation bilaterally.  Cardiovascular system: Regular rate and rhythm. No murmur, rub, or gallop. No JVD Gastrointestinal system: Abdomen soft, non-tender, non-distended, with normoactive bowel sounds. No organomegaly or masses felt. Central nervous system: Alert and oriented. No focal neurological deficits. Extremities: Swollen RLE to below the knee involving the foot with point tenderness over 1st MTP. Full ankle ROM. +homan's. LLE with less edema. Skin: No rashes, lesions no ulcers Psychiatry: Judgement and insight appear normal.  Mood & affect appropriate.   Data Reviewed: I have personally reviewed following labs and imaging studies  CBC:  Recent Labs Lab 01/25/16 0912 01/28/16 1932 01/29/16 0145 01/30/16 0637 01/31/16 0352  WBC 8.7 12.9* 12.4* 10.1 11.4*  NEUTROABS 6.8* 10.9*  --   --   --   HGB 9.2* 8.3* 8.3* 7.5* 6.8*  HCT 29.2* 25.9* 25.8* 23.0* 20.9*  MCV 90.4 89.9 89.9 87.5 88.2  PLT 279 233 235 227 [id number]   Basic Metabolic Panel:  Recent Labs Lab 01/25/16 0912 01/28/16 1932 01/29/16 0145 01/29/16 1942 01/30/16 0637 01/31/16 0352  NA 146* 145 143  --  142 140  K 3.2* 3.6 3.4*  --  3.6 3.8  CL  --  107 107  --  107 106  CO2 27 28 29   --  29 25  GLUCOSE 159* 147* 138* 215* 95 210*  BUN 23.0 36* 34*  --  35* 41*  CREATININE 2.8* 2.62* 2.54*  --  2.57* 2.91*  CALCIUM 9.0 8.4* 8.3*  --  8.2* 7.8*  GFR: Estimated Creatinine Clearance: 20.5 mL/min (by C-G formula based on SCr of 2.91 mg/dL (H)). Liver Function Tests:  Recent Labs Lab 01/25/16 0912 01/28/16 1932  AST 65* 25  ALT 29 18  ALKPHOS 140 92  BILITOT 0.53 0.9  PROT 6.3* 6.3*  ALBUMIN 2.7* 2.6*   No results for input(s): LIPASE, AMYLASE in the last 168 hours. No results for input(s): AMMONIA in the last 168 hours. Coagulation Profile:  Recent Labs Lab 01/28/16 2028  INR 1.28   Cardiac Enzymes: No results for input(s): CKTOTAL, CKMB, CKMBINDEX, TROPONINI in the last 168 hours. BNP (last 3 results) No results for input(s): PROBNP in the last 8760 hours. HbA1C: No results for input(s): HGBA1C in the last 72 hours. CBG:  Recent Labs Lab 01/30/16 1203 01/30/16 1706 01/30/16 2150 01/31/16 0755 01/31/16 1219  GLUCAP 99 207* 154* 182* 168*   Lipid Profile: No results for input(s): CHOL, HDL, LDLCALC, TRIG, CHOLHDL, LDLDIRECT in the last 72 hours. Thyroid Function Tests: No results for input(s): TSH, T4TOTAL, FREET4, T3FREE, THYROIDAB in the last 72 hours. Anemia Panel: No results for input(s): VITAMINB12,  FOLATE, FERRITIN, TIBC, IRON, RETICCTPCT in the last 72 hours. Urine analysis:    Component Value Date/Time   COLORURINE AMBER (A) 01/28/2016 2102   APPEARANCEUR CLOUDY (A) 01/28/2016 2102   LABSPEC 1.018 01/28/2016 2102   PHURINE 6.0 01/28/2016 2102   GLUCOSEU NEGATIVE 01/28/2016 2102   GLUCOSEU NEGATIVE 09/22/2014 0757   HGBUR NEGATIVE 01/28/2016 2102   BILIRUBINUR NEGATIVE 01/28/2016 2102   BILIRUBINUR n 04/30/2011 [place] NEGATIVE 01/28/2016 2102   PROTEINUR 30 (A) 01/28/2016 2102   UROBILINOGEN 0.2 09/22/2014 0757   NITRITE NEGATIVE 01/28/2016 2102   LEUKOCYTESUR NEGATIVE 01/28/2016 2102   Sepsis Labs: @LABRCNTIP (procalcitonin:4,lacticidven:4)  ) Recent Results (from the past 240 hour(s))  Culture, blood (routine x 2) Call MD if unable to obtain prior to antibiotics being given     Status: None (Preliminary result)   Collection Time: 01/29/16  1:00 AM  Result Value Ref Range Status   Specimen Description BLOOD BLOOD LEFT HAND  Final   Special Requests IN PEDIATRIC BOTTLE 5CC  Final   Culture   Final    NO GROWTH 2 DAYS Performed at [facility]    Report Status PENDING  Incomplete  Culture, blood (routine x 2) Call MD if unable to obtain prior to antibiotics being given     Status: None (Preliminary result)   Collection Time: 01/29/16  1:41 AM  Result Value Ref Range Status   Specimen Description BLOOD RIGHT ANTECUBITAL  Final   Special Requests BOTTLES DRAWN AEROBIC ONLY 5ML  Final   Culture   Final    NO GROWTH 2 DAYS Performed at [facility]    Report Status PENDING  Incomplete  MRSA PCR Screening     Status: None   Collection Time: 01/29/16  2:36 PM  Result Value Ref Range Status   MRSA by PCR NEGATIVE NEGATIVE Final    Comment:        The GeneXpert MRSA Assay (FDA approved for NASAL specimens only), is one component of a comprehensive MRSA colonization surveillance program. It is not intended to diagnose MRSA infection nor to  guide or monitor treatment for MRSA infections.      Radiology Studies: Dg Foot Complete Right  Result Date: 01/30/2016 CLINICAL DATA:  75 year old male with generalize right-sided foot pain. No history of injury. Swelling in the foot. EXAM: RIGHT FOOT COMPLETE -  3+ VIEW COMPARISON:  None. FINDINGS: No acute displaced fracture, subluxation or dislocation. There is advanced joint space narrowing, subchondral sclerosis, subchondral cyst formation and osteophyte formation at the first MTP joint, compatible with severe osteoarthritis. Small plantar calcaneal spur incidentally noted. IMPRESSION: 1. No acute radiographic abnormality of the right foot. 2. 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 Electronically Signed   By: Vinnie Langton M.D.   On: 01/30/2016 13:47    Scheduled Meds: . atenolol  50 mg Oral Daily  . atorvastatin  10 mg Oral QPC supper  . heparin subcutaneous  5,000 Units Subcutaneous Q8H  . insulin aspart  0-5 Units Subcutaneous QHS  . insulin aspart  0-9 Units Subcutaneous TID WC  . levofloxacin  750 mg Oral Q48H   Continuous Infusions:    LOS: 3 days   Time spent: 25 minutes.  Vance Gather, MD [facility] Pager [phone]  If 7PM-7AM, please contact night-coverage [email or web address] Password [email or web address] 01/31/2016, 2:11 PM

## 2016-01-31 NOTE — Progress Notes (Signed)
Nursing Note: Results of Doppler called to on-call.wbb

## 2016-02-01 DIAGNOSIS — J189 Pneumonia, unspecified organism: Principal | ICD-10-CM

## 2016-02-01 DIAGNOSIS — E119 Type 2 diabetes mellitus without complications: Secondary | ICD-10-CM

## 2016-02-01 DIAGNOSIS — E43 Unspecified severe protein-calorie malnutrition: Secondary | ICD-10-CM

## 2016-02-01 DIAGNOSIS — I10 Essential (primary) hypertension: Secondary | ICD-10-CM

## 2016-02-01 LAB — TYPE AND SCREEN
ABO/RH(D): B POS
Antibody Screen: NEGATIVE
Unit division: 0

## 2016-02-01 LAB — BASIC METABOLIC PANEL
Anion gap: 9 (ref 5–15)
BUN: 44 mg/dL — AB (ref 6–20)
CALCIUM: 8.2 mg/dL — AB (ref 8.9–10.3)
CO2: 25 mmol/L (ref 22–32)
CREATININE: 2.84 mg/dL — AB (ref 0.61–1.24)
Chloride: 106 mmol/L (ref 101–111)
GFR calc Af Amer: 24 mL/min — ABNORMAL LOW (ref >60)
GFR, EST NON AFRICAN AMERICAN: 20 mL/min — AB (ref >60)
GLUCOSE: 106 mg/dL — AB (ref 65–99)
POTASSIUM: 3.6 mmol/L (ref 3.5–5.1)
SODIUM: 140 mmol/L (ref 135–145)

## 2016-02-01 LAB — GLUCOSE, CAPILLARY
GLUCOSE-CAPILLARY: 152 mg/dL — AB (ref 65–99)
GLUCOSE-CAPILLARY: 144 mg/dL — AB (ref 65–99)
Glucose-Capillary: 108 mg/dL — ABNORMAL HIGH (ref 65–99)
Glucose-Capillary: 139 mg/dL — ABNORMAL HIGH (ref 65–99)

## 2016-02-01 LAB — CBC
HCT: 24.1 % — ABNORMAL LOW (ref 39.0–52.0)
Hemoglobin: 8.1 g/dL — ABNORMAL LOW (ref 13.0–17.0)
MCH: 29.1 pg (ref 26.0–34.0)
MCHC: 33.6 g/dL (ref 30.0–36.0)
MCV: 86.7 fL (ref 78.0–100.0)
PLATELETS: 261 10*3/uL (ref 150–400)
RBC: 2.78 MIL/uL — AB (ref 4.22–5.81)
RDW: 14.4 % (ref 11.5–15.5)
WBC: 11.9 10*3/uL — ABNORMAL HIGH (ref 4.0–10.5)

## 2016-02-01 LAB — OCCULT BLOOD X 1 CARD TO LAB, STOOL: Fecal Occult Bld: NEGATIVE

## 2016-02-01 MED ORDER — POLYETHYLENE GLYCOL 3350 17 G PO PACK
17.0000 g | PACK | Freq: Every day | ORAL | Status: DC
  Administered 2016-02-01 – 2016-02-02 (×2): 17 g via ORAL
  Filled 2016-02-01 (×2): qty 1

## 2016-02-01 MED ORDER — SORBITOL 70 % SOLN
960.0000 mL | TOPICAL_OIL | Freq: Once | ORAL | Status: AC
  Administered 2016-02-01: 960 mL via RECTAL
  Filled 2016-02-01: qty 240

## 2016-02-01 MED ORDER — LIP MEDEX EX OINT
TOPICAL_OINTMENT | CUTANEOUS | Status: AC
  Administered 2016-02-01: 11:00:00
  Filled 2016-02-01: qty 7

## 2016-02-01 MED ORDER — LACTULOSE 10 GM/15ML PO SOLN
30.0000 g | Freq: Once | ORAL | Status: AC
  Administered 2016-02-01: 30 g via ORAL
  Filled 2016-02-01: qty 45

## 2016-02-01 NOTE — Care Management Important Message (Signed)
Important Message  Patient Details  Name: NICOLINO MARVEL MRN: [id number] Date of Birth: March 29, 1941   Medicare Important Message Given:  Yes    Camillo Flaming 02/01/2016, 9:53 AMImportant Message  Patient Details  Name: JOANDY TSUCHIDA MRN: [id number] Date of Birth: 04-Oct-1940   Medicare Important Message Given:  Yes    Camillo Flaming 02/01/2016, 9:53 AM

## 2016-02-01 NOTE — Progress Notes (Signed)
PROGRESS NOTE    [name]  [id number] DOB: [birth year] DOA: 01/28/2016 PCP: [name], FNP    Brief Narrative:  75 y.o.gentleman with a history of castration-resistant metastatic prostate cancer, currently on Zytiga 500mg  daily under the direction of Dr. Alen Blew, who also had HTN, DM, and CKD 3. He was accompanied by his wife, who reported one week of decreased appetite, poor oral intake, progressive weakness, and gait instability. He has also had at least three falls. He has had increased pain and he cannot ambulate normally, particularly with his right leg. He had developed a cough on day of admission. No fever, sweats, or chills, but he has had hot flashes. No chest pain but he has had some shortness of breath. No nausea, vomiting, or abdominal pain.  ED Course:Chest xray concerning for LLL pneumonia. CT of the head negative for acute process, but CT of the cervical spine shows mild spondylosis of the cervical spine with disc disease at C3-4 and a sclerotic focus over the C3 spinous process, which could represent metastatic disease. He received empiric vancomycin and cefepime, treating as HCAP, due to his recent hospitalization for hyperglycemia. He had evidence of mild AKI on CKD 3.  He was admitted to [facility] service. RLE swelling and pain prompted doppler showing chronic clot. Eliquis was started with renal dosing. Hgb has continued to drop slowly to 6.8 on 10/24 in absence of evident bleeding. Transfusion was given  Assessment & Plan:   Principal Problem:   HCAP (healthcare-associated pneumonia) Active Problems:   Diabetes mellitus type 2 in nonobese [facility])   Essential hypertension   Acute kidney injury superimposed on chronic kidney disease (HCC)   Protein-calorie malnutrition, severe   Weakness   Right leg weakness   Right lower extremity chronic DVT:  -Previously treated with warfarin -Eliquis started this hospital admission  Normocytic, normochromic  anemia:  -No evidence of acute bleeding at this time -Patient was given blood transfusion this hospital admission -Labs reviewed. Hemoglobin remains overall stable, although it is slightly lower today than it was yesterday. -Repeat CBC in the morning -Hemoccult stools have been ordered, however patient has not stooled in 4-5 days (see below) -Follow-up on Hemoccult  HCAP -Patient was initially continued on cefepime and vancomycin, now discontinued -Patient continued now on levofloxacin, anticipate his stop date on 02/05/2016  Generalized weakness, gait abnormality, decreased PO intake:  -Suspect generalized weakness secondary to above anemia -Recommendations for skilled nursing facility at time of discharge -PT OT consulted  HTN -Blood pressure remained stable at present -Continue beta blocker as tolerated  Mild AKI on CKD 3 -Renal function remains stable -Patient given IV fluid hydration -We'll repeat basic metabolic panel in the morning  DM:  -Hemoglobin A1c noted to be 7.1 in September 2017. -Patient is continued on supplemental sliding scale insulin  Secondary adrenal insufficiency:  -Patient recently on prednisone -Cortisol this hospital admission noted to be borderline normal. Continue to hold off steroids at this time  History of prostate cancer:  -Prednisone discontinued -Patient is followed by oncology with follow-up appointment scheduled for 02/29/2016 -We'll have patient follow-up with oncology as scheduled  Constipation -Recent x-rays personally reviewed by myself. Stool burden noted in the ascending transverse and descending colons per my own read of lumbar x-ray -On further questioning, patient admits to having irregular bowel movements. Per family, last bowel movement was approximately 4-5 days prior -Have given a trial of MiraLAX with no result -Have ordered smog enema and lactulose -Continue cathartics  as needed to ensure bowel movement  DVT  prophylaxis: Eliquis Code Status: Full code Family Communication: Patient room, family at bedside Disposition Plan: Uncertain at this time  Consultants:     Procedures:     Antimicrobials: Anti-infectives    Start     Dose/Rate Route Frequency Ordered Stop   01/30/16 1400  levofloxacin (LEVAQUIN) tablet 750 mg     750 mg Oral Every 48 hours 01/30/16 1251 02/05/16 1359   01/30/16 0100  vancomycin (VANCOCIN) IVPB 750 mg/150 ml premix  Status:  Discontinued     750 mg 150 mL/hr over 60 Minutes Intravenous Every 24 hours 01/28/16 2249 01/29/16 2017   01/28/16 2300  ceFEPIme (MAXIPIME) 1 g in dextrose 5 % 50 mL IVPB  Status:  Discontinued     1 g 100 mL/hr over 30 Minutes Intravenous Every 8 hours 01/28/16 2241 01/28/16 2248   01/28/16 2300  ceFEPIme (MAXIPIME) 1 g in dextrose 5 % 50 mL IVPB  Status:  Discontinued     1 g 100 mL/hr over 30 Minutes Intravenous Every 24 hours 01/28/16 2248 01/30/16 1251   01/28/16 2300  vancomycin (VANCOCIN) IVPB 1000 mg/200 mL premix     1,000 mg 200 mL/hr over 60 Minutes Intravenous STAT 01/28/16 2248 01/29/16 0119       Subjective: No complaints  Objective: Vitals:   01/31/16 2100 02/01/16 0558 02/01/16 1114 02/01/16 1515  BP: 130/70 117/75 119/77 123/72  Pulse: 81 78 79 79  Resp: 16 16  17   Temp: 99.5 F (37.5 C) 99 F (37.2 C)  99.2 F (37.3 C)  TempSrc: Oral Oral  Oral  SpO2: 100% 97%  100%  Weight:      Height:        Intake/Output Summary (Last 24 hours) at 02/01/16 1747 Last data filed at 02/01/16 0855  Gross per 24 hour  Intake              480 ml  Output              425 ml  Net               55 ml   Filed Weights   01/29/16 0135  Weight: 65 kg (143 lb 4.8 oz)    Examination:  General exam: Appears calm and comfortable  Respiratory system: Clear to auscultation. Respiratory effort normal. Cardiovascular system: S1 & S2 heard, RRR. No JVD, murmurs, rubs, gallops or clicks. No pedal edema. Gastrointestinal  system: Abdomen Mildly distended, decreased bowel sounds Central nervous system: Alert and oriented. No focal neurological deficits. Extremities: Symmetric 5 x 5 power. Skin: No rashes, lesions or ulcers Psychiatry: Judgement and insight appear normal. Mood & affect appropriate.   Data Reviewed: I have personally reviewed following labs and imaging studies  CBC:  Recent Labs Lab 01/28/16 1932 01/29/16 0145 01/30/16 0637 01/31/16 0352 01/31/16 1505 02/01/16 0412  WBC 12.9* 12.4* 10.1 11.4*  --  11.9*  NEUTROABS 10.9*  --   --   --   --   --   HGB 8.3* 8.3* 7.5* 6.8* 8.7* 8.1*  HCT 25.9* 25.8* 23.0* 20.9* 26.1* 24.1*  MCV 89.9 89.9 87.5 88.2  --  86.7  PLT 233 235 227 235  --  0000000   Basic Metabolic Panel:  Recent Labs Lab 01/28/16 1932 01/29/16 0145 01/29/16 1942 01/30/16 0637 01/31/16 0352 02/01/16 0412  NA 145 143  --  142 140 140  K 3.6 3.4*  --  3.6 3.8 3.6  CL 107 107  --  107 106 106  CO2 28 29  --  29 25 25   GLUCOSE 147* 138* 215* 95 210* 106*  BUN 36* 34*  --  35* 41* 44*  CREATININE 2.62* 2.54*  --  2.57* 2.91* 2.84*  CALCIUM 8.4* 8.3*  --  8.2* 7.8* 8.2*   GFR: Estimated Creatinine Clearance: 21 mL/min (by C-G formula based on SCr of 2.84 mg/dL (H)). Liver Function Tests:  Recent Labs Lab 01/28/16 1932  AST 25  ALT 18  ALKPHOS 92  BILITOT 0.9  PROT 6.3*  ALBUMIN 2.6*   No results for input(s): LIPASE, AMYLASE in the last 168 hours. No results for input(s): AMMONIA in the last 168 hours. Coagulation Profile:  Recent Labs Lab 01/28/16 2028  INR 1.28   Cardiac Enzymes: No results for input(s): CKTOTAL, CKMB, CKMBINDEX, TROPONINI in the last 168 hours. BNP (last 3 results) No results for input(s): PROBNP in the last 8760 hours. HbA1C: No results for input(s): HGBA1C in the last 72 hours. CBG:  Recent Labs Lab 01/31/16 1804 01/31/16 2055 02/01/16 0805 02/01/16 1238 02/01/16 1605  GLUCAP 132* 106* 108* 152* 144*   Lipid  Profile: No results for input(s): CHOL, HDL, LDLCALC, TRIG, CHOLHDL, LDLDIRECT in the last 72 hours. Thyroid Function Tests: No results for input(s): TSH, T4TOTAL, FREET4, T3FREE, THYROIDAB in the last 72 hours. Anemia Panel: No results for input(s): VITAMINB12, FOLATE, FERRITIN, TIBC, IRON, RETICCTPCT in the last 72 hours. Sepsis Labs: No results for input(s): PROCALCITON, LATICACIDVEN in the last 168 hours.  Recent Results (from the past 240 hour(s))  Culture, blood (routine x 2) Call MD if unable to obtain prior to antibiotics being given     Status: None (Preliminary result)   Collection Time: 01/29/16  1:00 AM  Result Value Ref Range Status   Specimen Description BLOOD BLOOD LEFT HAND  Final   Special Requests IN PEDIATRIC BOTTLE 5CC  Final   Culture   Final    NO GROWTH 3 DAYS Performed at [facility]    Report Status PENDING  Incomplete  Culture, blood (routine x 2) Call MD if unable to obtain prior to antibiotics being given     Status: None (Preliminary result)   Collection Time: 01/29/16  1:41 AM  Result Value Ref Range Status   Specimen Description BLOOD RIGHT ANTECUBITAL  Final   Special Requests BOTTLES DRAWN AEROBIC ONLY 5ML  Final   Culture   Final    NO GROWTH 3 DAYS Performed at [facility]    Report Status PENDING  Incomplete  MRSA PCR Screening     Status: None   Collection Time: 01/29/16  2:36 PM  Result Value Ref Range Status   MRSA by PCR NEGATIVE NEGATIVE Final    Comment:        The GeneXpert MRSA Assay (FDA approved for NASAL specimens only), is one component of a comprehensive MRSA colonization surveillance program. It is not intended to diagnose MRSA infection nor to guide or monitor treatment for MRSA infections.      Radiology Studies: No results found.  Scheduled Meds: . atenolol  50 mg Oral Daily  . atorvastatin  10 mg Oral QPC supper  . enoxaparin (LOVENOX) injection  60 mg Subcutaneous Q24H  . insulin aspart  0-5  Units Subcutaneous QHS  . insulin aspart  0-9 Units Subcutaneous TID WC  . lactulose  30 g Oral Once  . levofloxacin  750 mg Oral Q48H  . polyethylene glycol  17 g Oral Daily  . sorbitol, milk of mag, mineral oil, glycerin (SMOG) enema  960 mL Rectal Once   Continuous Infusions:    LOS: 4 days   CHIU, Orpah Melter, MD [facility] Pager [phone]  If 7PM-7AM, please contact night-coverage [email or web address] Password [facility] 02/01/2016, 5:47 PM

## 2016-02-01 NOTE — Progress Notes (Addendum)
Physical Therapy Treatment Patient Details Name: [name] MRN: [id number] DOB: 1940-12-22 Today's Date: 02/01/2016    History of Present Illness Pt is a 75 y/o male with PMHx of prostate cancer, HTN, CKD, DVT, HLD, DM, and recent L wrist fx admitted with recent falls, weakness, and pain. Dx of PNA, AKI.     PT Comments    Edema noted R fingers, hand, forearm and R ankle (RN aware). R ankle edema has been present since PT eval 01/29/16, RUE edema new. Elevated RUE and RLE, instructed pt in AROM exercises for R hand and ankle. R finger AROM decreased 75% due to edema. 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 Overall decline in mobility as pt was able to ambulate 10/22, but hasn't been able to do so since then.    Follow Up Recommendations  SNF (pt requires assist for transfers and ambulation)     Equipment Recommendations  Rolling walker with 5" wheels    Recommendations for Other Services       Precautions / Restrictions Precautions Precautions: Fall Precaution Comments: several falls at home per family (pt denies) Restrictions Weight Bearing Restrictions: No    Mobility  Bed Mobility Overal bed mobility: Needs Assistance Bed Mobility: Supine to Sit     Supine to sit: Mod assist;HOB elevated     General bed mobility comments: pad used to pivot hips to EOB, mod A to raise trunk  Transfers Overall transfer level: Needs assistance Equipment used: Rolling walker (2 wheeled) Transfers: Sit to/from [facility] Sit to Stand: +2 physical assistance;Mod assist;From elevated surface Stand pivot transfers: +2 physical assistance;Mod assist       General transfer comment: sit to stand from elevated bed x 2 with +2 mod A to rise, VCs hand placement, blocked B feet as they tend  to slide forward, pt stood with hips/trunk flexed with RW, unable to stand fully erect  Ambulation/Gait             General Gait Details: unable due to pain R foot with weightbearing   Stairs            Wheelchair Mobility    Modified Rankin (Stroke Patients Only)       Balance     Sitting balance-Leahy Scale: Good       Standing balance-Leahy Scale: Poor                      Cognition Arousal/Alertness: Awake/alert Behavior During Therapy: WFL for tasks assessed/performed Overall Cognitive Status: Impaired/Different from baseline (pt poor historian, stated he can bathe himself at home but family stated this isn't true, family stated pt has been mildly confused for 3-4 weeks) Area of Impairment: Awareness;Problem solving     Memory: Decreased short-term memory       Problem Solving: Slow processing;Requires verbal cues General Comments: vague historian    Exercises General Exercises - Upper Extremity Shoulder Flexion: AAROM;Both;10 reps;Seated Digit Composite Flexion: Right;10 reps;Seated;AROM Composite Extension: AROM;Right;10 reps;Seated General Exercises - Lower Extremity Ankle Circles/Pumps: AROM;Both;15 reps;Seated;AAROM Long Arc Quad: AROM;10 reps;Seated;Right (x 5 on R limited by pain) Hip Flexion/Marching: AROM;Both;10 reps;Seated    General Comments        Pertinent Vitals/Pain Faces Pain Scale: Hurts little more Pain Location: R 1st MTP with weightbearing  Pain Descriptors / Indicators: Sore Pain Intervention(s): Limited activity within patient's tolerance;Monitored during session    Home Living                      Prior Function            PT Goals (current goals can now be found in the care plan section) Acute Rehab PT Goals Patient Stated Goal: to get stronger PT Goal Formulation: With patient/family Time For Goal Achievement: 02/12/16 Potential to Achieve Goals: Fair Progress towards PT goals: Progressing  toward goals    Frequency    Min 3X/week      PT Plan Current plan remains appropriate    Co-evaluation             End of Session Equipment Utilized During Treatment: Gait belt Activity Tolerance: Patient tolerated treatment well Patient left: in chair;with call bell/phone within reach;with chair alarm set;with family/visitor present     Time: [id number] PT Time Calculation (min) (ACUTE ONLY): 27 min  Charges:  $Therapeutic Exercise: 8-22 mins $Therapeutic Activity: 8-22 mins                    G Codes:      Philomena Doheny 02/01/2016, 10:21 AM [phone]

## 2016-02-02 ENCOUNTER — Inpatient Hospital Stay (HOSPITAL_COMMUNITY): Payer: Medicare Other

## 2016-02-02 DIAGNOSIS — B3781 Candidal esophagitis: Secondary | ICD-10-CM | POA: Diagnosis not present

## 2016-02-02 DIAGNOSIS — F0390 Unspecified dementia without behavioral disturbance: Secondary | ICD-10-CM | POA: Diagnosis present

## 2016-02-02 DIAGNOSIS — R0902 Hypoxemia: Secondary | ICD-10-CM | POA: Diagnosis not present

## 2016-02-02 DIAGNOSIS — N179 Acute kidney failure, unspecified: Secondary | ICD-10-CM

## 2016-02-02 DIAGNOSIS — J9811 Atelectasis: Secondary | ICD-10-CM | POA: Diagnosis not present

## 2016-02-02 DIAGNOSIS — C61 Malignant neoplasm of prostate: Secondary | ICD-10-CM | POA: Diagnosis not present

## 2016-02-02 DIAGNOSIS — N189 Chronic kidney disease, unspecified: Secondary | ICD-10-CM

## 2016-02-02 DIAGNOSIS — J690 Pneumonitis due to inhalation of food and vomit: Secondary | ICD-10-CM | POA: Diagnosis present

## 2016-02-02 DIAGNOSIS — R42 Dizziness and giddiness: Secondary | ICD-10-CM | POA: Diagnosis not present

## 2016-02-02 DIAGNOSIS — R031 Nonspecific low blood-pressure reading: Secondary | ICD-10-CM | POA: Diagnosis not present

## 2016-02-02 DIAGNOSIS — D62 Acute posthemorrhagic anemia: Secondary | ICD-10-CM | POA: Diagnosis present

## 2016-02-02 DIAGNOSIS — D509 Iron deficiency anemia, unspecified: Secondary | ICD-10-CM | POA: Diagnosis not present

## 2016-02-02 DIAGNOSIS — E119 Type 2 diabetes mellitus without complications: Secondary | ICD-10-CM | POA: Diagnosis not present

## 2016-02-02 DIAGNOSIS — R195 Other fecal abnormalities: Secondary | ICD-10-CM | POA: Diagnosis not present

## 2016-02-02 DIAGNOSIS — R2689 Other abnormalities of gait and mobility: Secondary | ICD-10-CM | POA: Diagnosis not present

## 2016-02-02 DIAGNOSIS — R799 Abnormal finding of blood chemistry, unspecified: Secondary | ICD-10-CM | POA: Diagnosis not present

## 2016-02-02 DIAGNOSIS — R630 Anorexia: Secondary | ICD-10-CM | POA: Diagnosis not present

## 2016-02-02 DIAGNOSIS — I130 Hypertensive heart and chronic kidney disease with heart failure and stage 1 through stage 4 chronic kidney disease, or unspecified chronic kidney disease: Secondary | ICD-10-CM | POA: Diagnosis present

## 2016-02-02 DIAGNOSIS — K922 Gastrointestinal hemorrhage, unspecified: Secondary | ICD-10-CM | POA: Diagnosis not present

## 2016-02-02 DIAGNOSIS — E785 Hyperlipidemia, unspecified: Secondary | ICD-10-CM | POA: Diagnosis not present

## 2016-02-02 DIAGNOSIS — I959 Hypotension, unspecified: Secondary | ICD-10-CM | POA: Diagnosis not present

## 2016-02-02 DIAGNOSIS — R29898 Other symptoms and signs involving the musculoskeletal system: Secondary | ICD-10-CM

## 2016-02-02 DIAGNOSIS — L89152 Pressure ulcer of sacral region, stage 2: Secondary | ICD-10-CM | POA: Diagnosis present

## 2016-02-02 DIAGNOSIS — E860 Dehydration: Secondary | ICD-10-CM | POA: Diagnosis not present

## 2016-02-02 DIAGNOSIS — Z923 Personal history of irradiation: Secondary | ICD-10-CM | POA: Diagnosis not present

## 2016-02-02 DIAGNOSIS — I5032 Chronic diastolic (congestive) heart failure: Secondary | ICD-10-CM | POA: Diagnosis present

## 2016-02-02 DIAGNOSIS — I82501 Chronic embolism and thrombosis of unspecified deep veins of right lower extremity: Secondary | ICD-10-CM | POA: Diagnosis present

## 2016-02-02 DIAGNOSIS — E1122 Type 2 diabetes mellitus with diabetic chronic kidney disease: Secondary | ICD-10-CM | POA: Diagnosis not present

## 2016-02-02 DIAGNOSIS — E784 Other hyperlipidemia: Secondary | ICD-10-CM | POA: Diagnosis not present

## 2016-02-02 DIAGNOSIS — I509 Heart failure, unspecified: Secondary | ICD-10-CM | POA: Diagnosis not present

## 2016-02-02 DIAGNOSIS — Z7901 Long term (current) use of anticoagulants: Secondary | ICD-10-CM | POA: Diagnosis not present

## 2016-02-02 DIAGNOSIS — J189 Pneumonia, unspecified organism: Secondary | ICD-10-CM | POA: Diagnosis not present

## 2016-02-02 DIAGNOSIS — I10 Essential (primary) hypertension: Secondary | ICD-10-CM | POA: Diagnosis not present

## 2016-02-02 DIAGNOSIS — T18128A Food in esophagus causing other injury, initial encounter: Secondary | ICD-10-CM | POA: Diagnosis not present

## 2016-02-02 DIAGNOSIS — K2211 Ulcer of esophagus with bleeding: Secondary | ICD-10-CM | POA: Diagnosis present

## 2016-02-02 DIAGNOSIS — T18128D Food in esophagus causing other injury, subsequent encounter: Secondary | ICD-10-CM | POA: Diagnosis not present

## 2016-02-02 DIAGNOSIS — I82419 Acute embolism and thrombosis of unspecified femoral vein: Secondary | ICD-10-CM | POA: Diagnosis not present

## 2016-02-02 DIAGNOSIS — E876 Hypokalemia: Secondary | ICD-10-CM | POA: Diagnosis not present

## 2016-02-02 DIAGNOSIS — D649 Anemia, unspecified: Secondary | ICD-10-CM | POA: Diagnosis not present

## 2016-02-02 DIAGNOSIS — K220 Achalasia of cardia: Secondary | ICD-10-CM | POA: Diagnosis not present

## 2016-02-02 DIAGNOSIS — E43 Unspecified severe protein-calorie malnutrition: Secondary | ICD-10-CM | POA: Diagnosis present

## 2016-02-02 DIAGNOSIS — Z7984 Long term (current) use of oral hypoglycemic drugs: Secondary | ICD-10-CM | POA: Diagnosis not present

## 2016-02-02 DIAGNOSIS — K209 Esophagitis, unspecified: Secondary | ICD-10-CM | POA: Diagnosis not present

## 2016-02-02 DIAGNOSIS — N184 Chronic kidney disease, stage 4 (severe): Secondary | ICD-10-CM | POA: Diagnosis present

## 2016-02-02 DIAGNOSIS — E870 Hyperosmolality and hypernatremia: Secondary | ICD-10-CM | POA: Diagnosis not present

## 2016-02-02 DIAGNOSIS — R451 Restlessness and agitation: Secondary | ICD-10-CM | POA: Diagnosis not present

## 2016-02-02 DIAGNOSIS — E2740 Unspecified adrenocortical insufficiency: Secondary | ICD-10-CM | POA: Diagnosis present

## 2016-02-02 DIAGNOSIS — E46 Unspecified protein-calorie malnutrition: Secondary | ICD-10-CM | POA: Diagnosis not present

## 2016-02-02 DIAGNOSIS — A419 Sepsis, unspecified organism: Secondary | ICD-10-CM | POA: Diagnosis not present

## 2016-02-02 DIAGNOSIS — M6281 Muscle weakness (generalized): Secondary | ICD-10-CM | POA: Diagnosis not present

## 2016-02-02 LAB — BASIC METABOLIC PANEL
ANION GAP: 9 (ref 5–15)
BUN: 47 mg/dL — AB (ref 6–20)
CO2: 25 mmol/L (ref 22–32)
Calcium: 8.2 mg/dL — ABNORMAL LOW (ref 8.9–10.3)
Chloride: 106 mmol/L (ref 101–111)
Creatinine, Ser: 2.88 mg/dL — ABNORMAL HIGH (ref 0.61–1.24)
GFR calc Af Amer: 23 mL/min — ABNORMAL LOW (ref >60)
GFR, EST NON AFRICAN AMERICAN: 20 mL/min — AB (ref >60)
GLUCOSE: 138 mg/dL — AB (ref 65–99)
POTASSIUM: 3.6 mmol/L (ref 3.5–5.1)
Sodium: 140 mmol/L (ref 135–145)

## 2016-02-02 LAB — CBC
HEMATOCRIT: 24.5 % — AB (ref 39.0–52.0)
HEMOGLOBIN: 8.2 g/dL — AB (ref 13.0–17.0)
MCH: 29.2 pg (ref 26.0–34.0)
MCHC: 33.5 g/dL (ref 30.0–36.0)
MCV: 87.2 fL (ref 78.0–100.0)
Platelets: 285 10*3/uL (ref 150–400)
RBC: 2.81 MIL/uL — ABNORMAL LOW (ref 4.22–5.81)
RDW: 14.5 % (ref 11.5–15.5)
WBC: 11.9 10*3/uL — ABNORMAL HIGH (ref 4.0–10.5)

## 2016-02-02 LAB — GLUCOSE, CAPILLARY
Glucose-Capillary: 150 mg/dL — ABNORMAL HIGH (ref 65–99)
Glucose-Capillary: 151 mg/dL — ABNORMAL HIGH (ref 65–99)

## 2016-02-02 MED ORDER — LACTULOSE 10 GM/15ML PO SOLN
30.0000 g | Freq: Once | ORAL | Status: AC
  Administered 2016-02-02: 30 g via ORAL
  Filled 2016-02-02: qty 45

## 2016-02-02 MED ORDER — LEVOFLOXACIN 750 MG PO TABS: 750.0000 mg | ORAL_TABLET | ORAL | 0 refills | Status: DC

## 2016-02-02 MED ORDER — POLYETHYLENE GLYCOL 3350 17 G PO PACK: 17.0000 g | PACK | Freq: Every day | ORAL | 0 refills | Status: DC

## 2016-02-02 MED ORDER — ENOXAPARIN SODIUM 60 MG/0.6ML ~~LOC~~ SOLN: 60.0000 mg | SUBCUTANEOUS | Status: DC

## 2016-02-02 MED ORDER — FERROUS SULFATE 325 (65 FE) MG PO TABS: 325.0000 mg | ORAL_TABLET | Freq: Every day | ORAL | 0 refills | Status: DC

## 2016-02-02 MED ORDER — DOCUSATE SODIUM 100 MG PO CAPS: 100.0000 mg | ORAL_CAPSULE | Freq: Two times a day (BID) | ORAL | 0 refills | Status: DC

## 2016-02-02 NOTE — Progress Notes (Signed)
Report called to Bree at [facility] that will receive the patient.  Patient stable from AM assessment.  Patient will be transported to [facility] via [facility].

## 2016-02-02 NOTE — Evaluation (Signed)
Occupational Therapy Evaluation Patient Details Name: [name] MRN: [id number] DOB: 1940-10-17 Today's Date: 02/02/2016    History of Present Illness Pt is a 75 y/o male with PMHx of prostate cancer, HTN, CKD, DVT, HLD, DM, and recent L wrist fx admitted with recent falls, weakness, and pain. Dx of PNA, AKI.    Clinical Impression   Pt was admitted for the above. He will benefit from continued OT to increase strength and endurance for adls.  Pt has recently needed increased assistance. Goals are for min A for mobility related to ADLs.    Follow Up Recommendations  SNF    Equipment Recommendations  3 in 1 bedside comode    Recommendations for Other Services       Precautions / Restrictions Precautions Precautions: Fall Restrictions Weight Bearing Restrictions: No      Mobility Bed Mobility         Supine to sit: Mod assist;HOB elevated     General bed mobility comments: pad used to assist hips scoot forward  Transfers   Equipment used: Rolling walker (2 wheeled)   Sit to Stand: +2 physical assistance;Mod assist;From elevated surface         General transfer comment: assist to rise and stabilize.  multimodal cues to stand up tall    Balance                                            ADL Overall ADL's : Needs assistance/impaired     Grooming: Set up;Sitting   Upper Body Bathing: Set up;Sitting   Lower Body Bathing: Moderate assistance;Sit to/from stand;+2 for safety/equipment   Upper Body Dressing : Minimal assistance;Sitting   Lower Body Dressing: Maximal assistance;+2 for safety/equipment;Sit to/from stand                 General ADL Comments: spoke to PT yesterday and they recommended OT look at pt due to RUE edema.  Pt with minimal edema in R forearm and elbow.  Positioned. Pt able to move and use bil UEs without difficulty.  Pt agreeable to getting up to chair, but he changed his mind and only stood.  +2  safety, mod A.  Multimodal cues to stand up tall     Vision     Perception     Praxis      Pertinent Vitals/Pain Pain Assessment: Faces Faces Pain Scale: Hurts little more Pain Location: ?  pt denied pain but grimaced Pain Descriptors / Indicators: Grimacing Pain Intervention(s): Limited activity within patient's tolerance;Monitored during session     Hand Dominance     Extremity/Trunk Assessment Upper Extremity Assessment Upper Extremity Assessment: Overall WFL for tasks assessed (RUE edema at elbow, minimal)           Communication Communication Communication: No difficulties   Cognition Arousal/Alertness: Awake/alert Behavior During Therapy: WFL for tasks assessed/performed Overall Cognitive Status: Impaired/Different from baseline Area of Impairment: Awareness;Problem solving             Problem Solving: Slow processing;Requires verbal cues     General Comments       Exercises       Shoulder Instructions      Home Living Family/patient expects to be discharged to:: Skilled nursing facility Living Arrangements: Spouse/significant other  Prior Functioning/Environment Level of Independence: Independent        Comments: at baseline pt is independent, recently he's needed assistance for getting out of bed and for ADLs        OT Problem List: Decreased strength;Decreased activity tolerance;Impaired balance (sitting and/or standing);Decreased cognition;Pain   OT Treatment/Interventions: Self-care/ADL training;DME and/or AE instruction;Patient/family education;Balance training;Therapeutic activities;Cognitive remediation/compensation    OT Goals(Current goals can be found in the care plan section) Acute Rehab OT Goals Patient Stated Goal: to get stronger OT Goal Formulation: With patient Time For Goal Achievement: 02/16/16 Potential to Achieve Goals: Good ADL Goals Pt Will Transfer to Toilet:  (P) with min assist;with +2 assist;bedside commode;stand pivot transfer Additional ADL Goal #1: (P) pt will go from sit to stand with min A and maintain for 2 minutes with min guard for adls Additional ADL Goal #2: (P) pt will perform AROM/level 1 theraband with RUE with supervision for strengthening for adls and edema control  OT Frequency: Min 2X/week   Barriers to D/C:            Co-evaluation              End of Session    Activity Tolerance: Patient tolerated treatment well Patient left: in bed;with call bell/phone within reach;with bed alarm set   Time: 1020-1038 OT Time Calculation (min): 18 min Charges:  OT General Charges $OT Visit: 1 Procedure OT Evaluation $OT Eval Moderate Complexity: 1 Procedure G-Codes:    Rakin Lemelle Feb 18, 2016, 11:30 AM  Lesle Chris, OTR/L [phone] Feb 18, 2016

## 2016-02-02 NOTE — Clinical Social Work Placement (Signed)
   CLINICAL SOCIAL WORK PLACEMENT  NOTE  Date:  02/02/2016  Patient Details  Name: [name] MRN: [id number] Date of Birth: Aug 27, 1940  Clinical Social Work is seeking post-discharge placement for this patient at the [place] level of care (*CSW will initial, date and re-position this form in  chart as items are completed):  Yes   Patient/family provided with [place] Work Department's list of facilities offering this level of care within the geographic area requested by the patient (or if unable, by the patient's family).  Yes   Patient/family informed of their freedom to choose among providers that offer the needed level of care, that participate in Medicare, Medicaid or managed care program needed by the patient, have an available bed and are willing to accept the patient.  Yes   Patient/family informed of [place]'s ownership interest in [facility] and [facility] as well as of the fact that they are under no obligation to receive care at these facilities.  PASRR submitted to EDS on 01/30/16     PASRR number received on 01/30/16     Existing PASRR number confirmed on       FL2 transmitted to all facilities in geographic area requested by pt/family on 01/30/16     FL2 transmitted to all facilities within larger geographic area on       Patient informed that his/her managed care company has contracts with or will negotiate with certain facilities, including the following:        Yes   Patient/family informed of bed offers received.  Patient chooses bed at [facility]     Physician recommends and patient chooses bed at      Patient to be transferred to [facility] on 02/02/16.  Patient to be transferred to facility by [facility]     Patient family notified on 02/02/16 of transfer.  Name of family member notified:  SON     PHYSICIAN       Additional Comment: Pt / family are in agreement with d/c to  [facility] today. [facility] transport required. Pt / son are aware out of pocket costs may be associated with [facility] transport. D/C Summary sent to SNF for review. Scripts included in d/c packet . # for report provided.   _______________________________________________ Luretha Rued, [place] [phone] 02/02/2016, 4:30 PM

## 2016-02-02 NOTE — Discharge Summary (Addendum)
Physician Discharge Summary  [name] [id number] DOB: Jun 04, 1940 DOA: 01/28/2016  PCP: [name], FNP  Admit date: 01/28/2016 Discharge date: 02/02/2016  Admitted From: Home Disposition:  SNF  Recommendations for Outpatient Follow-up:  1. Follow up with PCP in 1-2 weeks 2. Please obtain BMP/CBC in one week 3. Follow up with Dr. Alen Blew as scheduled 4. Please ensure regular bowel movements 5. Stop date for levaquin is on 10/29. Next dose is on 10/27  Discharge Condition:Stable CODE STATUS:Full Diet recommendation: Diabetic   Brief/Interim Summary: 75 y.o.gentleman with a history of castration-resistant metastatic prostate cancer, currently on Zytiga 500mg  daily under the direction of Dr. Alen Blew, who also had HTN, DM, and CKD 3. He was accompanied by his wife, who reportedone week of decreased appetite, poor oral intake, progressive weakness, and gait instability. He has also had at least three falls. He has had increased pain and he cannot ambulate normally, particularly with his right leg. He had developed a cough on day of admission. No fever, sweats, or chills, but he has had hot flashes. No chest pain but he has had some shortness of breath. No nausea, vomiting, or abdominal pain.  ED Course:Chest xray concerning for LLL pneumonia. CT of the head negative for acute process, but CT of the cervical spine shows mild spondylosis of the cervical spine with disc disease at C3-4 and a sclerotic focus over the C3 spinous process, which could represent metastatic disease. He received empiric vancomycin and cefepime, treating as HCAP, due to his recent hospitalization for hyperglycemia. He hadevidence of mild AKI on CKD 3.  He was admitted to [facility] service. RLE swelling and pain prompted doppler showing chronic clot. Lovenox was started with renal dosing. Hgb has continued to drop slowly to 6.8 on 10/24 in absence of evident bleeding. Transfusion was given  Right  lower extremity chronic DVT:  -Previously treated with warfarin -Lovenox was started this admission. Discussed with pharmacy regarding dosing -Would have patient follow up with Oncology as outpatient  Normocytic, normochromic anemia: -No evidence of acute bleeding at this time -Heme neg -Patient received blood transfusion this hospital admission -Labs were reviewed. Hemoglobin remained overall stable after blood transfusion -Hemoccult stools have been ordered and stools are negative for blood, thus doubt GI bleeding -Will start patient on iron supplementation and have patient follow up with Heme/Onc as outpatient  HCAP -Patient was initially continued on cefepime and vancomycin, now discontinued -Patient continued now on levofloxacin, anticipate his stop date on 02/05/2016. Next dose of levofloxacin is on 02/03/16 -Patient noted to be febrile early morning of 10/26, but has remained afebrile afterwards  Generalized weakness, gait abnormality, decreased PO intake: -Suspect generalized weakness secondary to above anemia -Recommendations for skilled nursing facility at time of discharge -PT OT consulted  HTN -Blood pressure remained stable at present -Continue beta blocker as tolerated  Mild AKI on CKD 3 -Renal function remains stable and near baseline -Patient given IV fluid hydration  DM:  -Hemoglobin A1c noted to be 7.1 in September 2017. -Patient is continued on supplemental sliding scale insulin -Plan to resume home PO diabetic meds on discharge  Secondary adrenal insufficiency:  -Patient recently on prednisone -Cortisol this hospital admission noted to be borderline normal. Continue to hold off steroids at this time  History of prostate cancer:  -Prednisone discontinued -Patient is followed by oncology with follow-up appointment scheduled for 02/29/2016 -We'll have patient follow-up with oncology as scheduled  Constipation -Recent x-rays personally  reviewed by myself. Stool burden  noted in the ascending transverse and descending colons per my own read of lumbar x-ray -On further questioning, patient admits to having irregular bowel movements. Per family, last bowel movement was approximately at time of admission -Have given a trial of MiraLAX with no result -Have ordered smog enema and lactulose with some result -Recommend daily miralax and stool softeners to ensure regular bowel movements  Discharge Diagnoses:  Principal Problem:   HCAP (healthcare-associated pneumonia) Active Problems:   Diabetes mellitus type 2 in nonobese [facility])   Essential hypertension   Acute kidney injury superimposed on chronic kidney disease ([place])   Protein-calorie malnutrition, severe   Weakness   Right leg weakness    Discharge Instructions     Medication List    TAKE these medications   atenolol 100 MG tablet Commonly known as:  TENORMIN Take 50 mg by mouth daily.   atorvastatin 10 MG tablet Commonly known as:  LIPITOR Take 1 tablet (10 mg total) by mouth daily after supper.   docusate sodium 100 MG capsule Commonly known as:  COLACE Take 1 capsule (100 mg total) by mouth 2 (two) times daily.   enoxaparin 60 MG/0.6ML injection Commonly known as:  LOVENOX Inject 0.6 mLs (60 mg total) into the skin daily.   ferrous sulfate 325 (65 FE) MG tablet Take 1 tablet (325 mg total) by mouth daily with breakfast.   furosemide 40 MG tablet Commonly known as:  LASIX Take 40 mg by mouth daily.   GLIPIZIDE XL 5 MG 24 hr tablet Generic drug:  glipiZIDE TAKE 1 TABLET BY MOUTH DAILY WITH BREAKFAST. What changed:  See the new instructions.   levofloxacin 750 MG tablet Commonly known as:  LEVAQUIN Take 1 tablet (750 mg total) by mouth every other day. Start taking on:  02/03/2016   ONE TOUCH ULTRA TEST test strip Generic drug:  glucose blood USE TO TEST ONCE DAILY   ONETOUCH DELICA LANCETS 99991111 Misc USE TO TEST ONCE DAILY Dx code E11.65    pioglitazone 30 MG tablet Commonly known as:  ACTOS TAKE 1 TABLET BY MOUTH EVERY DAY What changed:  See the new instructions.   polyethylene glycol packet Commonly known as:  MIRALAX / GLYCOLAX Take 17 g by mouth daily. Start taking on:  02/03/2016   potassium chloride SA 20 MEQ tablet Commonly known as:  KLOR-CON M20 Take 1 tablet (20 mEq total) by mouth daily.   TRADJENTA 5 MG Tabs tablet Generic drug:  linagliptin TAKE 1 TABLET (5 MG TOTAL) BY MOUTH DAILY.   ZYTIGA 250 MG tablet Generic drug:  abiraterone Acetate TAKE 4 TABLETS (1,000MG ) BY MOUTH EVERY DAY ON AN EMPTY STOMACH 1 HOURBEFORE OR 2 HOURS AFTER A MEAL       No Known Allergies  Consultations:  Oncology  Procedures/Studies: Dg Chest 2 View  Result Date: 01/28/2016 CLINICAL DATA:  Weakness with left neck pain and right shoulder pain. EXAM: CHEST  2 VIEW COMPARISON:  01/09/2016 and 08/20/2010 FINDINGS: Lungs are adequately inflated with hazy opacification over the posterior mid to lower lungs on the lateral film which may be retrocardiac on the frontal film. No evidence of effusion. Cardiomediastinal silhouette is within normal. There is mild degenerate change of the spine. IMPRESSION: Hazy opacification over the posterior mid to lower lungs on the lateral film possibly within the left lower lobe and may be due to pneumonia. Recommend follow-up chest radiograph in 3-4 weeks to document resolution. Electronically Signed   By: Marin Olp M.D.  On: 01/28/2016 20:15   Dg Chest 2 View  Result Date: 01/09/2016 CLINICAL DATA:  Fevers and hyperglycemia EXAM: CHEST  2 VIEW COMPARISON:  11/17/2015 FINDINGS: Cardiac shadow is within normal limits. The lungs are well aerated bilaterally. Patchy changes seen on prior PET-CT are not well appreciated on this exam. No acute bony abnormality is noted. IMPRESSION: No active cardiopulmonary disease. Electronically Signed   By: Inez Catalina M.D.   On: 01/09/2016 11:14   Dg Lumbar  Spine Complete  Result Date: 01/29/2016 CLINICAL DATA:  Acute low back pain, weakness, history of prostate cancer and multiple falls. EXAM: LUMBAR SPINE - COMPLETE 4+ VIEW COMPARISON:  11/07/2015 CT reconstructions. FINDINGS: Mild diffuse osteopenia and endplate degenerative changes. Degenerative disc disease most pronounced at L5-S1 with disc space narrowing, sclerosis and osteophytes. No acute compression fracture, wedge-shaped deformity or focal kyphosis. Facets are aligned. No pars defects. Normal appearing pedicles. SI joint arthropathy present bilaterally. Prostate calcifications noted. Normal bowel gas pattern. IMPRESSION: Degenerative lumbar spondylosis as above. No acute osseous finding by plain radiography Electronically Signed   By: Jerilynn Mages.  Shick M.D.   On: 01/29/2016 11:36   Dg Wrist Complete Left  Result Date: 01/09/2016 CLINICAL DATA:  Bilateral arm and hand weakness, fall, initial encounter. EXAM: LEFT WRIST - COMPLETE 3+ VIEW COMPARISON:  None. FINDINGS: Slight irregularity is seen along the dorsal aspect of the wrist on the lateral view, with soft tissue swelling. Irregularity appears slightly lower than expected for a triquetral avulsion fracture but it is not excluded. Otherwise, no acute osseous abnormality. Degenerative changes are seen in the wrist, including chondrocalcinosis. IMPRESSION: 1. Slight irregularity along the dorsal aspect of the carpal bones. Difficult to definitively exclude a triquetral avulsion fracture. Please see discussion above. 2. Degenerative changes in the wrist. Electronically Signed   By: Lorin Picket M.D.   On: 01/09/2016 11:08   Dg Wrist Complete Right  Result Date: 01/09/2016 CLINICAL DATA:  Wrist pain EXAM: RIGHT WRIST - COMPLETE 3+ VIEW COMPARISON:  None. FINDINGS: No distal radius or ulnar fracture. Radiocarpal joint is intact. No carpal fracture. No soft tissue abnormality. IMPRESSION: No fracture or dislocation. Electronically Signed   By: Suzy Bouchard M.D.   On: 01/09/2016 11:15   Ct Head Wo Contrast  Result Date: 01/28/2016 CLINICAL DATA:  Recent fall with neck pain. History of metastatic prostate cancer. EXAM: CT HEAD WITHOUT CONTRAST CT CERVICAL SPINE WITHOUT CONTRAST TECHNIQUE: Multidetector CT imaging of the head and cervical spine was performed following the standard protocol without intravenous contrast. Multiplanar CT image reconstructions of the cervical spine were also generated. COMPARISON:  None. FINDINGS: CT HEAD FINDINGS Brain: Ventricles, cisterns and other CSF spaces are within normal. There is no mass, mass effect, shift of midline structures or acute hemorrhage. There is no evidence of acute infarction. Minimal chronic ischemic microvascular disease. Vascular: Calcified plaque over the cavernous segment of the internal carotid arteries. Skull: Within normal. Sinuses/Orbits: Within normal. CT CERVICAL SPINE FINDINGS Alignment: Subtle 1-2 mm degenerative posterior subluxation of C3 on C4. Skull base and vertebrae: Mild spondylosis throughout the cervical spine. Mild uncovertebral joint spurring and facet arthropathy. Soft tissues and spinal canal: Within normal. Disc levels:  Moderate disc space narrowing at the C3-4 level. Upper chest: Within normal. Other: Sclerotic focus over the spinous process of C3 which may be due to bone island versus metastatic disease in this patient with known metastatic prostate cancer. Small rounded lucent lesion over the right side of the C5 vertebral body  which may represent a small hemangioma and less likely metastatic focus. IMPRESSION: No acute intracranial findings. Minimal chronic ischemic microvascular disease. No acute cervical spine injury. Mild spondylosis throughout the cervical spine with disc disease at the C3-4 level. Sclerotic focus over the C3 spinous process which may be due to bone island versus metastatic disease in this patient with known metastatic prostate cancer. Electronically  Signed   By: Marin Olp M.D.   On: 01/28/2016 21:23   Ct Cervical Spine Wo Contrast  Result Date: 01/28/2016 CLINICAL DATA:  Recent fall with neck pain. History of metastatic prostate cancer. EXAM: CT HEAD WITHOUT CONTRAST CT CERVICAL SPINE WITHOUT CONTRAST TECHNIQUE: Multidetector CT imaging of the head and cervical spine was performed following the standard protocol without intravenous contrast. Multiplanar CT image reconstructions of the cervical spine were also generated. COMPARISON:  None. FINDINGS: CT HEAD FINDINGS Brain: Ventricles, cisterns and other CSF spaces are within normal. There is no mass, mass effect, shift of midline structures or acute hemorrhage. There is no evidence of acute infarction. Minimal chronic ischemic microvascular disease. Vascular: Calcified plaque over the cavernous segment of the internal carotid arteries. Skull: Within normal. Sinuses/Orbits: Within normal. CT CERVICAL SPINE FINDINGS Alignment: Subtle 1-2 mm degenerative posterior subluxation of C3 on C4. Skull base and vertebrae: Mild spondylosis throughout the cervical spine. Mild uncovertebral joint spurring and facet arthropathy. Soft tissues and spinal canal: Within normal. Disc levels:  Moderate disc space narrowing at the C3-4 level. Upper chest: Within normal. Other: Sclerotic focus over the spinous process of C3 which may be due to bone island versus metastatic disease in this patient with known metastatic prostate cancer. Small rounded lucent lesion over the right side of the C5 vertebral body which may represent a small hemangioma and less likely metastatic focus. IMPRESSION: No acute intracranial findings. Minimal chronic ischemic microvascular disease. No acute cervical spine injury. Mild spondylosis throughout the cervical spine with disc disease at the C3-4 level. Sclerotic focus over the C3 spinous process which may be due to bone island versus metastatic disease in this patient with known metastatic  prostate cancer. Electronically Signed   By: Marin Olp M.D.   On: 01/28/2016 21:23   Dg Chest Port 1 View  Result Date: 02/02/2016 CLINICAL DATA:  Follow-up pneumonia EXAM: PORTABLE CHEST 1 VIEW COMPARISON:  01/28/2016 FINDINGS: Cardiomediastinal silhouette is stable. Large hiatal hernia left lower lobe retrocardiac again noted. No infiltrate or pulmonary edema. Tortuous descending aorta. IMPRESSION: Large hiatal hernia again noted.  No infiltrate or pulmonary edema. Electronically Signed   By: Lahoma Crocker M.D.   On: 02/02/2016 08:34   Dg Hand Complete Left  Result Date: 01/09/2016 CLINICAL DATA:  Swelling. EXAM: LEFT HAND - COMPLETE 3+ VIEW COMPARISON:  01/09/2016. FINDINGS: Diffuse osteopenia degenerative change. No acute abnormality identified. IMPRESSION: Diffuse osteopenia and degenerative change. No acute abnormality identified. Electronically Signed   By: Marcello Moores  Register   On: 01/09/2016 11:05   Dg Hand Complete Right  Result Date: 01/09/2016 CLINICAL DATA:  Weakness. EXAM: RIGHT HAND - COMPLETE 3+ VIEW COMPARISON:  No recent prior. FINDINGS: Diffuse osteopenia. No acute bony abnormality identified. Diffuse degenerative change. No evidence of fracture dislocation. IMPRESSION: Diffuse osteopenia and degenerative change. No acute bony abnormality identified. Electronically Signed   By: Marcello Moores  Register   On: 01/09/2016 11:07   Dg Foot Complete Right  Result Date: 01/30/2016 CLINICAL DATA:  75 year old male with generalize right-sided foot pain. No history of injury. Swelling in the foot. EXAM: RIGHT  FOOT COMPLETE - 3+ VIEW COMPARISON:  None. FINDINGS: No acute displaced fracture, subluxation or dislocation. There is advanced joint space narrowing, subchondral sclerosis, subchondral cyst formation and osteophyte formation at the first MTP joint, compatible with severe osteoarthritis. Small plantar calcaneal spur incidentally noted. IMPRESSION: 1. No acute radiographic abnormality of the  right foot. 2. 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 Electronically Signed   By: Vinnie Langton M.D.   On: 01/30/2016 13:47    Subjective: No complaints this AM  Discharge Exam: Vitals:   02/02/16 1427 02/02/16 1451  BP: 121/84 116/76  Pulse: 75 77  Resp:    Temp: 97.5 F (36.4 C) (!) 96.8 F (36 C)   Vitals:   02/02/16 0512 02/02/16 0943 02/02/16 1427 02/02/16 1451  BP: 118/76 114/77 121/84 116/76  Pulse: 85 80 75 77  Resp: 17 18    Temp: (!) 101.1 F (38.4 C) 98.7 F (37.1 C) 97.5 F (36.4 C) (!) 96.8 F (36 C)  TempSrc: Oral Oral Oral Oral  SpO2: 99% 100% 94% 100%  Weight:      Height:        General: Pt is alert, awake, not in acute distress Cardiovascular: RRR, S1/S2 +, no rubs, no gallops Respiratory: CTA bilaterally, no wheezing, no rhonchi Abdominal: Soft, NT, ND, bowel sounds + Extremities: no edema, no cyanosis   The results of significant diagnostics from this hospitalization (including imaging, microbiology, ancillary and laboratory) are listed below for reference.     Microbiology: Recent Results (from the past 240 hour(s))  Culture, blood (routine x 2) Call MD if unable to obtain prior to antibiotics being given     Status: None (Preliminary result)   Collection Time: 01/29/16  1:00 AM  Result Value Ref Range Status   Specimen Description BLOOD BLOOD LEFT HAND  Final   Special Requests IN PEDIATRIC BOTTLE 5CC  Final   Culture   Final    NO GROWTH 4 DAYS Performed at [facility]    Report Status PENDING  Incomplete  Culture, blood (routine x 2) Call MD if unable to obtain prior to antibiotics being given     Status: None (Preliminary result)   Collection Time: 01/29/16  1:41 AM  Result Value Ref Range Status   Specimen Description BLOOD RIGHT ANTECUBITAL  Final   Special Requests BOTTLES DRAWN AEROBIC ONLY 5ML  Final   Culture   Final    NO GROWTH 4 DAYS Performed at [facility]    Report  Status PENDING  Incomplete  MRSA PCR Screening     Status: None   Collection Time: 01/29/16  2:36 PM  Result Value Ref Range Status   MRSA by PCR NEGATIVE NEGATIVE Final    Comment:        The GeneXpert MRSA Assay (FDA approved for NASAL specimens only), is one component of a comprehensive MRSA colonization surveillance program. It is not intended to diagnose MRSA infection nor to guide or monitor treatment for MRSA infections.      Labs: BNP (last 3 results) No results for input(s): BNP in the last 8760 hours. Basic Metabolic Panel:  Recent Labs Lab 01/29/16 0145 01/29/16 1942 01/30/16 0637 01/31/16 0352 02/01/16 0412 02/02/16 0407  NA 143  --  142 140 140 140  K 3.4*  --  3.6 3.8 3.6 3.6  CL 107  --  107 106 106 106  CO2 29  --  29 25 25  25  GLUCOSE 138* 215* 95 210* 106* 138*  BUN 34*  --  35* 41* 44* 47*  CREATININE 2.54*  --  2.57* 2.91* 2.84* 2.88*  CALCIUM 8.3*  --  8.2* 7.8* 8.2* 8.2*   Liver Function Tests:  Recent Labs Lab 01/28/16 1932  AST 25  ALT 18  ALKPHOS 92  BILITOT 0.9  PROT 6.3*  ALBUMIN 2.6*   No results for input(s): LIPASE, AMYLASE in the last 168 hours. No results for input(s): AMMONIA in the last 168 hours. CBC:  Recent Labs Lab 01/28/16 1932 01/29/16 0145 01/30/16 [id number] 01/31/16 0352 01/31/16 1505 02/01/16 0412 02/02/16 0407  WBC 12.9* 12.4* 10.1 11.4*  --  11.9* 11.9*  NEUTROABS 10.9*  --   --   --   --   --   --   HGB 8.3* 8.3* 7.5* 6.8* 8.7* 8.1* 8.2*  HCT 25.9* 25.8* 23.0* 20.9* 26.1* 24.1* 24.5*  MCV 89.9 89.9 87.5 88.2  --  86.7 87.2  PLT 233 235 227 235  --  261 285   Cardiac Enzymes: No results for input(s): CKTOTAL, CKMB, CKMBINDEX, TROPONINI in the last 168 hours. BNP: Invalid input(s): POCBNP CBG:  Recent Labs Lab 02/01/16 1238 02/01/16 1605 02/01/16 2201 02/02/16 0811 02/02/16 1221  GLUCAP 152* 144* 139* 150* 151*   D-Dimer No results for input(s): DDIMER in the last 72 hours. Hgb A1c No  results for input(s): HGBA1C in the last 72 hours. Lipid Profile No results for input(s): CHOL, HDL, LDLCALC, TRIG, CHOLHDL, LDLDIRECT in the last 72 hours. Thyroid function studies No results for input(s): TSH, T4TOTAL, T3FREE, THYROIDAB in the last 72 hours.  Invalid input(s): FREET3 Anemia work up No results for input(s): VITAMINB12, FOLATE, FERRITIN, TIBC, IRON, RETICCTPCT in the last 72 hours. Urinalysis    Component Value Date/Time   COLORURINE AMBER (A) 01/28/2016 2102   APPEARANCEUR CLOUDY (A) 01/28/2016 2102   LABSPEC 1.018 01/28/2016 2102   PHURINE 6.0 01/28/2016 2102   GLUCOSEU NEGATIVE 01/28/2016 2102   GLUCOSEU NEGATIVE 09/22/2014 0757   HGBUR NEGATIVE 01/28/2016 2102   BILIRUBINUR NEGATIVE 01/28/2016 2102   BILIRUBINUR n 04/30/2011 1534   KETONESUR NEGATIVE 01/28/2016 2102   PROTEINUR 30 (A) 01/28/2016 2102   UROBILINOGEN 0.2 09/22/2014 0757   NITRITE NEGATIVE 01/28/2016 2102   LEUKOCYTESUR NEGATIVE 01/28/2016 2102   Sepsis Labs Invalid input(s): PROCALCITONIN,  WBC,  LACTICIDVEN Microbiology Recent Results (from the past 240 hour(s))  Culture, blood (routine x 2) Call MD if unable to obtain prior to antibiotics being given     Status: None (Preliminary result)   Collection Time: 01/29/16  1:00 AM  Result Value Ref Range Status   Specimen Description BLOOD BLOOD LEFT HAND  Final   Special Requests IN PEDIATRIC BOTTLE 5CC  Final   Culture   Final    NO GROWTH 4 DAYS Performed at [facility]    Report Status PENDING  Incomplete  Culture, blood (routine x 2) Call MD if unable to obtain prior to antibiotics being given     Status: None (Preliminary result)   Collection Time: 01/29/16  1:41 AM  Result Value Ref Range Status   Specimen Description BLOOD RIGHT ANTECUBITAL  Final   Special Requests BOTTLES DRAWN AEROBIC ONLY 5ML  Final   Culture   Final    NO GROWTH 4 DAYS Performed at [facility]    Report Status PENDING  Incomplete  MRSA PCR  Screening     Status: None  Collection Time: 01/29/16  2:36 PM  Result Value Ref Range Status   MRSA by PCR NEGATIVE NEGATIVE Final    Comment:        The GeneXpert MRSA Assay (FDA approved for NASAL specimens only), is one component of a comprehensive MRSA colonization surveillance program. It is not intended to diagnose MRSA infection nor to guide or monitor treatment for MRSA infections.      SIGNED:   Donne Hazel, MD  [facility] 02/02/2016, 3:58 PM  If 7PM-7AM, please contact night-coverage [email or web address] Password [email or web address]

## 2016-02-02 NOTE — Progress Notes (Signed)
Physical Therapy Treatment Patient Details Name: [name] MRN: [id number] DOB: 1940-04-24 Today's Date: 02/02/2016    History of Present Illness Pt is a 75 y/o male with PMHx of prostate cancer, HTN, CKD, DVT, HLD, DM, and recent L wrist fx admitted with recent falls, weakness, and pain. Dx of PNA, AKI.     PT Comments    Pt was seen in bed upon arrival with family present. Performed bed mobility requiring modA/minA to shift hips. Required elevated bed and VC's to place BLE on floor and underneath BOS before rising from the bed. Sit to stand x modA with  + 2 physical assistance needed. VC's required to correct posterior lean while initially standing. Ambulated 150 ft requiring minA using a BUE platform walker. >50% VC's required to increase BOS and step length during gait to improve technique and safety awareness. Patient displays edema in R ankle and initially was very guarded at beginning of gait trial. Patient eventually equally distributed weight during ambulation.   Follow Up Recommendations  SNF     Equipment Recommendations  Rolling walker with 5" wheels    Recommendations for Other Services       Precautions / Restrictions L wrist brace   Mobility  Bed Mobility Overal bed mobility: Needs Assistance Bed Mobility: Supine to Sit     Supine to sit: Mod assist;HOB elevated;Min assist     General bed mobility comments: inceased time to perform supine to sit. VC's required and modA/minA to reposition hips.   Transfers Overall transfer level: Needs assistance Equipment used: Rolling walker (2 wheeled) Transfers: Sit to/from Stand Sit to Stand: +2 physical assistance;Mod assist;From elevated surface         General transfer comment: Required VC's for safe foot placement before rising from bed. Elevated bed to perform sit to stand.  Posterior lean during transfer.   Ambulation/Gait Ambulation/Gait assistance: Min assist Ambulation Distance (Feet): 150  Feet Assistive device: Bilateral platform walker Gait Pattern/deviations: Step-through pattern;Decreased step length - right;Decreased step length - left;Decreased stride length;Trunk flexed;Narrow base of support Gait velocity: decreased Gait velocity interpretation: Below normal speed for age/gender General Gait Details: Required VC's to increase BOS and step length.    Stairs            Wheelchair Mobility    Modified Rankin (Stroke Patients Only)       Balance                                    Cognition Arousal/Alertness: Awake/alert Behavior During Therapy: WFL for tasks assessed/performed Overall Cognitive Status: Impaired/Different from baseline Area of Impairment: Awareness;Problem solving     Memory: Decreased short-term memory       Problem Solving: Slow processing;Requires verbal cues      Exercises      General Comments        Pertinent Vitals/Pain Pain Assessment: No/denies pain Faces Pain Scale: Hurts a little bit Pain Location: ?  pt denied pain but grimaced Pain Descriptors / Indicators: Guarding Pain Intervention(s): Monitored during session;Repositioned;Limited activity within patient's tolerance    Home Living Family/patient expects to be discharged to:: Skilled nursing facility Living Arrangements: Spouse/significant other                  Prior Function Level of Independence: Independent      Comments: at baseline pt is independent, recently he's needed assistance for getting out of  bed and for ADLs   PT Goals (current goals can now be found in the care plan section) Acute Rehab PT Goals Patient Stated Goal: to get stronger Progress towards PT goals: Progressing toward goals    Frequency    Min 3X/week      PT Plan Current plan remains appropriate    Co-evaluation             End of Session Equipment Utilized During Treatment: Gait belt Activity Tolerance: Patient tolerated treatment  well Patient left: in chair;with call bell/phone within reach;with chair alarm set;with family/visitor present     Time:  - 11:42 - 12:10     Charges:    1 gt   1 ta                   G CodesHall Busing, SPTA [facility] Acute Rehab [phone]  Present during session and agree  Rica Koyanagi  PTA [facility]  Acute  Rehab Pager      [phone]

## 2016-02-03 LAB — CULTURE, BLOOD (ROUTINE X 2)
CULTURE: NO GROWTH
CULTURE: NO GROWTH

## 2016-02-08 DIAGNOSIS — R42 Dizziness and giddiness: Secondary | ICD-10-CM | POA: Diagnosis not present

## 2016-02-08 DIAGNOSIS — R031 Nonspecific low blood-pressure reading: Secondary | ICD-10-CM | POA: Diagnosis not present

## 2016-02-08 DIAGNOSIS — R451 Restlessness and agitation: Secondary | ICD-10-CM | POA: Diagnosis not present

## 2016-02-08 DIAGNOSIS — N189 Chronic kidney disease, unspecified: Secondary | ICD-10-CM

## 2016-02-08 DIAGNOSIS — E119 Type 2 diabetes mellitus without complications: Secondary | ICD-10-CM | POA: Diagnosis not present

## 2016-02-08 HISTORY — DX: Chronic kidney disease, unspecified: N18.9

## 2016-02-09 DIAGNOSIS — I10 Essential (primary) hypertension: Secondary | ICD-10-CM | POA: Diagnosis not present

## 2016-02-09 DIAGNOSIS — E1122 Type 2 diabetes mellitus with diabetic chronic kidney disease: Secondary | ICD-10-CM | POA: Diagnosis not present

## 2016-02-09 DIAGNOSIS — E785 Hyperlipidemia, unspecified: Secondary | ICD-10-CM | POA: Diagnosis not present

## 2016-02-09 DIAGNOSIS — I82419 Acute embolism and thrombosis of unspecified femoral vein: Secondary | ICD-10-CM | POA: Diagnosis not present

## 2016-02-10 DIAGNOSIS — C61 Malignant neoplasm of prostate: Secondary | ICD-10-CM | POA: Diagnosis not present

## 2016-02-10 DIAGNOSIS — R451 Restlessness and agitation: Secondary | ICD-10-CM | POA: Diagnosis not present

## 2016-02-10 DIAGNOSIS — N179 Acute kidney failure, unspecified: Secondary | ICD-10-CM | POA: Diagnosis not present

## 2016-02-10 DIAGNOSIS — E860 Dehydration: Secondary | ICD-10-CM | POA: Diagnosis not present

## 2016-02-13 ENCOUNTER — Telehealth: Payer: Self-pay | Admitting: *Deleted

## 2016-02-13 DIAGNOSIS — M6281 Muscle weakness (generalized): Secondary | ICD-10-CM | POA: Diagnosis not present

## 2016-02-13 DIAGNOSIS — R630 Anorexia: Secondary | ICD-10-CM | POA: Diagnosis not present

## 2016-02-13 DIAGNOSIS — E860 Dehydration: Secondary | ICD-10-CM | POA: Diagnosis not present

## 2016-02-13 DIAGNOSIS — N179 Acute kidney failure, unspecified: Secondary | ICD-10-CM | POA: Diagnosis not present

## 2016-02-13 NOTE — Telephone Encounter (Signed)
rec'd call from [place] NP at [facility]. [phone]. She states patient has an appt 02-29-16, but would like for him to be seen sooner. Patient has increased confusion,increased agitation, weaker, not eating or drinking, dehydrated.

## 2016-02-14 NOTE — Telephone Encounter (Signed)
Left message for [place] NP to call me.

## 2016-02-14 NOTE — Telephone Encounter (Signed)
They need to make sure he is of Zytiga for now. He needs to see his medical doctor regarding his other issues. Unrelated to his cancer.

## 2016-02-14 NOTE — Telephone Encounter (Signed)
Spoke with [place] NP. zytiga was stopped today. Patient should see his primary care physician for all other non-cancer related issues

## 2016-02-15 ENCOUNTER — Encounter (HOSPITAL_COMMUNITY): Payer: Self-pay | Admitting: Emergency Medicine

## 2016-02-15 ENCOUNTER — Inpatient Hospital Stay (HOSPITAL_COMMUNITY)
Admission: EM | Admit: 2016-02-15 | Discharge: 2016-02-29 | DRG: 391 | Disposition: A | Discharge status: Skilled Nursing Facility | Payer: Medicare Other | Attending: Internal Medicine | Admitting: Internal Medicine

## 2016-02-15 DIAGNOSIS — K922 Gastrointestinal hemorrhage, unspecified: Secondary | ICD-10-CM | POA: Diagnosis not present

## 2016-02-15 DIAGNOSIS — K209 Esophagitis, unspecified without bleeding: Secondary | ICD-10-CM

## 2016-02-15 DIAGNOSIS — Z7901 Long term (current) use of anticoagulants: Secondary | ICD-10-CM

## 2016-02-15 DIAGNOSIS — L89152 Pressure ulcer of sacral region, stage 2: Secondary | ICD-10-CM | POA: Diagnosis not present

## 2016-02-15 DIAGNOSIS — Z8546 Personal history of malignant neoplasm of prostate: Secondary | ICD-10-CM

## 2016-02-15 DIAGNOSIS — Z87891 Personal history of nicotine dependence: Secondary | ICD-10-CM

## 2016-02-15 DIAGNOSIS — I82501 Chronic embolism and thrombosis of unspecified deep veins of right lower extremity: Secondary | ICD-10-CM | POA: Diagnosis present

## 2016-02-15 DIAGNOSIS — Z6821 Body mass index (BMI) 21.0-21.9, adult: Secondary | ICD-10-CM

## 2016-02-15 DIAGNOSIS — J9811 Atelectasis: Secondary | ICD-10-CM | POA: Diagnosis not present

## 2016-02-15 DIAGNOSIS — D509 Iron deficiency anemia, unspecified: Secondary | ICD-10-CM | POA: Diagnosis present

## 2016-02-15 DIAGNOSIS — K449 Diaphragmatic hernia without obstruction or gangrene: Secondary | ICD-10-CM | POA: Diagnosis present

## 2016-02-15 DIAGNOSIS — D649 Anemia, unspecified: Secondary | ICD-10-CM | POA: Diagnosis present

## 2016-02-15 DIAGNOSIS — E43 Unspecified severe protein-calorie malnutrition: Secondary | ICD-10-CM | POA: Diagnosis present

## 2016-02-15 DIAGNOSIS — Z8249 Family history of ischemic heart disease and other diseases of the circulatory system: Secondary | ICD-10-CM

## 2016-02-15 DIAGNOSIS — Z79899 Other long term (current) drug therapy: Secondary | ICD-10-CM

## 2016-02-15 DIAGNOSIS — K220 Achalasia of cardia: Secondary | ICD-10-CM | POA: Diagnosis not present

## 2016-02-15 DIAGNOSIS — J690 Pneumonitis due to inhalation of food and vomit: Secondary | ICD-10-CM | POA: Diagnosis not present

## 2016-02-15 DIAGNOSIS — Z7984 Long term (current) use of oral hypoglycemic drugs: Secondary | ICD-10-CM

## 2016-02-15 DIAGNOSIS — I959 Hypotension, unspecified: Secondary | ICD-10-CM

## 2016-02-15 DIAGNOSIS — N179 Acute kidney failure, unspecified: Secondary | ICD-10-CM | POA: Diagnosis not present

## 2016-02-15 DIAGNOSIS — Z7401 Bed confinement status: Secondary | ICD-10-CM

## 2016-02-15 DIAGNOSIS — R195 Other fecal abnormalities: Secondary | ICD-10-CM

## 2016-02-15 DIAGNOSIS — E2740 Unspecified adrenocortical insufficiency: Secondary | ICD-10-CM | POA: Diagnosis present

## 2016-02-15 DIAGNOSIS — K2970 Gastritis, unspecified, without bleeding: Secondary | ICD-10-CM | POA: Diagnosis present

## 2016-02-15 DIAGNOSIS — N184 Chronic kidney disease, stage 4 (severe): Secondary | ICD-10-CM | POA: Diagnosis not present

## 2016-02-15 DIAGNOSIS — T18128A Food in esophagus causing other injury, initial encounter: Secondary | ICD-10-CM | POA: Diagnosis present

## 2016-02-15 DIAGNOSIS — I10 Essential (primary) hypertension: Secondary | ICD-10-CM | POA: Diagnosis present

## 2016-02-15 DIAGNOSIS — E11649 Type 2 diabetes mellitus with hypoglycemia without coma: Secondary | ICD-10-CM | POA: Diagnosis present

## 2016-02-15 DIAGNOSIS — K2211 Ulcer of esophagus with bleeding: Secondary | ICD-10-CM | POA: Diagnosis present

## 2016-02-15 DIAGNOSIS — D638 Anemia in other chronic diseases classified elsewhere: Secondary | ICD-10-CM | POA: Diagnosis present

## 2016-02-15 DIAGNOSIS — R509 Fever, unspecified: Secondary | ICD-10-CM

## 2016-02-15 DIAGNOSIS — K210 Gastro-esophageal reflux disease with esophagitis: Secondary | ICD-10-CM | POA: Diagnosis present

## 2016-02-15 DIAGNOSIS — E785 Hyperlipidemia, unspecified: Secondary | ICD-10-CM | POA: Diagnosis present

## 2016-02-15 DIAGNOSIS — E870 Hyperosmolality and hypernatremia: Secondary | ICD-10-CM

## 2016-02-15 DIAGNOSIS — W44F3XA Food entering into or through a natural orifice, initial encounter: Secondary | ICD-10-CM

## 2016-02-15 DIAGNOSIS — D62 Acute posthemorrhagic anemia: Secondary | ICD-10-CM | POA: Diagnosis present

## 2016-02-15 DIAGNOSIS — I5032 Chronic diastolic (congestive) heart failure: Secondary | ICD-10-CM | POA: Diagnosis present

## 2016-02-15 DIAGNOSIS — K228 Other specified diseases of esophagus: Secondary | ICD-10-CM | POA: Diagnosis present

## 2016-02-15 DIAGNOSIS — B3781 Candidal esophagitis: Secondary | ICD-10-CM | POA: Diagnosis not present

## 2016-02-15 DIAGNOSIS — Z833 Family history of diabetes mellitus: Secondary | ICD-10-CM

## 2016-02-15 DIAGNOSIS — F0390 Unspecified dementia without behavioral disturbance: Secondary | ICD-10-CM | POA: Diagnosis present

## 2016-02-15 DIAGNOSIS — I130 Hypertensive heart and chronic kidney disease with heart failure and stage 1 through stage 4 chronic kidney disease, or unspecified chronic kidney disease: Secondary | ICD-10-CM | POA: Diagnosis present

## 2016-02-15 DIAGNOSIS — L8990 Pressure ulcer of unspecified site, unspecified stage: Secondary | ICD-10-CM | POA: Insufficient documentation

## 2016-02-15 DIAGNOSIS — E876 Hypokalemia: Secondary | ICD-10-CM | POA: Diagnosis present

## 2016-02-15 DIAGNOSIS — E860 Dehydration: Secondary | ICD-10-CM | POA: Diagnosis not present

## 2016-02-15 DIAGNOSIS — E1122 Type 2 diabetes mellitus with diabetic chronic kidney disease: Secondary | ICD-10-CM | POA: Diagnosis present

## 2016-02-15 DIAGNOSIS — I509 Heart failure, unspecified: Secondary | ICD-10-CM

## 2016-02-15 DIAGNOSIS — R627 Adult failure to thrive: Secondary | ICD-10-CM | POA: Diagnosis present

## 2016-02-15 DIAGNOSIS — R799 Abnormal finding of blood chemistry, unspecified: Secondary | ICD-10-CM | POA: Diagnosis not present

## 2016-02-15 DIAGNOSIS — C61 Malignant neoplasm of prostate: Secondary | ICD-10-CM

## 2016-02-15 DIAGNOSIS — A419 Sepsis, unspecified organism: Secondary | ICD-10-CM

## 2016-02-15 DIAGNOSIS — Z923 Personal history of irradiation: Secondary | ICD-10-CM

## 2016-02-15 DIAGNOSIS — E1165 Type 2 diabetes mellitus with hyperglycemia: Secondary | ICD-10-CM | POA: Diagnosis present

## 2016-02-15 HISTORY — DX: Chronic kidney disease, unspecified: N18.9

## 2016-02-15 HISTORY — DX: Type 2 diabetes mellitus without complications: E11.9

## 2016-02-15 LAB — CBC WITH DIFFERENTIAL/PLATELET
BASOS ABS: 0 10*3/uL (ref 0.0–0.1)
Basophils Relative: 0 %
EOS ABS: 0.1 10*3/uL (ref 0.0–0.7)
EOS PCT: 1 %
HCT: 21.3 % — ABNORMAL LOW (ref 39.0–52.0)
Hemoglobin: 7.1 g/dL — ABNORMAL LOW (ref 13.0–17.0)
LYMPHS PCT: 7 %
Lymphs Abs: 1.1 10*3/uL (ref 0.7–4.0)
MCH: 28.2 pg (ref 26.0–34.0)
MCHC: 33.3 g/dL (ref 30.0–36.0)
MCV: 84.5 fL (ref 78.0–100.0)
MONO ABS: 0.8 10*3/uL (ref 0.1–1.0)
Monocytes Relative: 6 %
Neutro Abs: 12.8 10*3/uL — ABNORMAL HIGH (ref 1.7–7.7)
Neutrophils Relative %: 86 %
PLATELETS: 535 10*3/uL — AB (ref 150–400)
RBC: 2.52 MIL/uL — AB (ref 4.22–5.81)
RDW: 15.6 % — AB (ref 11.5–15.5)
WBC: 14.9 10*3/uL — AB (ref 4.0–10.5)

## 2016-02-15 LAB — URINALYSIS, ROUTINE W REFLEX MICROSCOPIC
Bilirubin Urine: NEGATIVE
Glucose, UA: NEGATIVE mg/dL
Hgb urine dipstick: NEGATIVE
Ketones, ur: NEGATIVE mg/dL
Leukocytes, UA: NEGATIVE
Nitrite: NEGATIVE
Protein, ur: NEGATIVE mg/dL
Specific Gravity, Urine: 1.017 (ref 1.005–1.030)
pH: 5.5 (ref 5.0–8.0)

## 2016-02-15 LAB — PREPARE RBC (CROSSMATCH)

## 2016-02-15 LAB — BASIC METABOLIC PANEL
Anion gap: 10 (ref 5–15)
BUN: 84 mg/dL — ABNORMAL HIGH (ref 6–20)
CO2: 26 mmol/L (ref 22–32)
Calcium: 7.5 mg/dL — ABNORMAL LOW (ref 8.9–10.3)
Chloride: 104 mmol/L (ref 101–111)
Creatinine, Ser: 3.65 mg/dL — ABNORMAL HIGH (ref 0.61–1.24)
GFR calc Af Amer: 17 mL/min — ABNORMAL LOW (ref >60)
GFR calc non Af Amer: 15 mL/min — ABNORMAL LOW (ref >60)
Glucose, Bld: 233 mg/dL — ABNORMAL HIGH (ref 65–99)
Potassium: 2.7 mmol/L — CL (ref 3.5–5.1)
Sodium: 140 mmol/L (ref 135–145)

## 2016-02-15 LAB — POC OCCULT BLOOD, ED: Fecal Occult Bld: POSITIVE — AB

## 2016-02-15 LAB — PROTIME-INR
INR: 1.03
Prothrombin Time: 13.5 seconds (ref 11.4–15.2)

## 2016-02-15 MED ORDER — SODIUM CHLORIDE 0.9 % IV SOLN
INTRAVENOUS | Status: DC
  Administered 2016-02-15: 23:00:00 via INTRAVENOUS

## 2016-02-15 MED ORDER — SODIUM CHLORIDE 0.9 % IV SOLN
Freq: Once | INTRAVENOUS | Status: AC
  Administered 2016-02-15: 21:00:00 via INTRAVENOUS

## 2016-02-15 MED ORDER — SODIUM CHLORIDE 0.9 % IV BOLUS (SEPSIS)
500.0000 mL | Freq: Once | INTRAVENOUS | Status: AC
  Administered 2016-02-15: 500 mL via INTRAVENOUS

## 2016-02-15 MED ORDER — SODIUM CHLORIDE 0.9 % IV SOLN
Freq: Once | INTRAVENOUS | Status: AC
  Administered 2016-02-15: 22:00:00 via INTRAVENOUS
  Filled 2016-02-15: qty 1000

## 2016-02-15 NOTE — ED Triage Notes (Signed)
Per EMS pt from [facility] facility requested by PCP due to low hemoglobin. EMS states facility was unaware of lab value and did not have paperwork to send. Pt has no c/o. Pt was given 1 liter D5W via clysis prior to arrival.

## 2016-02-15 NOTE — ED Provider Notes (Signed)
[facility] DEPT Provider Note   CSN: [id number] Arrival date & time: 02/15/16  1846     History   Chief Complaint Chief Complaint  Patient presents with  . Hemoglobin check    HPI [name] is a 75 y.o. male.  Patient is a 75 year old male with a history of metastatic prostate cancer, on Zytiga, DVT on coumadin, hypertension, hyperlipidemia, DM and chronic kidney disease presenting from skilled facility today for concern for anemia.  Per EMS patient is from [facility] and they reported he had a low hemoglobin today however staff does not have the report of that his hemoglobin was. Patient currently has no complaints except that he needs to use the bathroom.   The history is provided by the nursing home.    Past Medical History:  Diagnosis Date  . Cataract   . Diabetes mellitus   . Hyperlipidemia   . Hypertension   . Prostate cancer ([place])   . Renal insufficiency     Patient Active Problem List   Diagnosis Date Noted  . Weakness 01/29/2016  . Right leg weakness 01/29/2016  . HCAP (healthcare-associated pneumonia) 01/28/2016  . Dehydration   . Hypomagnesemia   . Protein-calorie malnutrition, severe 01/09/2016  . Hyperglycemia 01/08/2016  . Hypokalemia 01/08/2016  . Acute kidney injury superimposed on chronic kidney disease ([place]) 01/08/2016  . Prolonged Q-T interval on ECG 01/08/2016  . 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 Bilateral lower extremity edema 09/07/2013  . Type II or unspecified type diabetes mellitus without mention of complication, uncontrolled 05/20/2013  . Pulmonary embolus ([place]) 09/01/2010  . Diabetes mellitus type 2 in nonobese ([place]) 09/15/2006  . Hyperlipidemia associated with type 2 diabetes mellitus ([place]) 09/13/2006  . Essential hypertension 09/13/2006  . Disorder resulting from impaired renal function 09/13/2006  . PROSTATE CANCER, HX OF 09/13/2006    Past Surgical History:  Procedure Laterality Date  . PROSTATE SURGERY      radiation therapy only       Home Medications    Prior to Admission medications   Medication Sig Start Date End Date Taking? Authorizing Provider  atenolol (TENORMIN) 100 MG tablet Take 50 mg by mouth daily.  02/09/13   Historical Provider, MD  atorvastatin (LIPITOR) 10 MG tablet Take 1 tablet (10 mg total) by mouth daily after supper. 10/10/15   Elayne Snare, MD  docusate sodium (COLACE) 100 MG capsule Take 1 capsule (100 mg total) by mouth 2 (two) times daily. 02/02/16   Donne Hazel, MD  enoxaparin (LOVENOX) 60 MG/0.6ML injection Inject 0.6 mLs (60 mg total) into the skin daily. 02/02/16   Donne Hazel, MD  ferrous sulfate 325 (65 FE) MG tablet Take 1 tablet (325 mg total) by mouth daily with breakfast. 02/02/16   Donne Hazel, MD  furosemide (LASIX) 40 MG tablet Take 40 mg by mouth daily.  01/11/16   Historical Provider, MD  GLIPIZIDE XL 5 MG 24 hr tablet TAKE 1 TABLET BY MOUTH DAILY WITH BREAKFAST. Patient taking differently: TAKE 5mg  TABLET BY MOUTH DAILY WITH BREAKFAST. 11/24/15   Elayne Snare, MD  levofloxacin (LEVAQUIN) 750 MG tablet Take 1 tablet (750 mg total) by mouth every other day. 02/03/16   Donne Hazel, MD  ONE [facility] ULTRA TEST test strip USE TO TEST ONCE DAILY 08/29/15   Elayne Snare, MD  [facility] DELICA LANCETS [id number] MISC USE TO TEST ONCE DAILY Dx code E11.65 01/04/16   Elayne Snare, MD  pioglitazone (ACTOS) 30 MG  tablet TAKE 1 TABLET BY MOUTH EVERY DAY Patient taking differently: TAKE 30mg  TABLET BY MOUTH EVERY DAY 01/02/16   Elayne Snare, MD  polyethylene glycol (MIRALAX / GLYCOLAX) packet Take 17 g by mouth daily. 02/03/16   Donne Hazel, MD  potassium chloride SA (KLOR-CON M20) 20 MEQ tablet Take 1 tablet (20 mEq total) by mouth daily. 09/01/15   [name], FNP  TRADJENTA 5 MG TABS tablet TAKE 1 TABLET (5 MG TOTAL) BY MOUTH DAILY. 11/28/15   Elayne Snare, MD  ZYTIGA 250 MG tablet TAKE 4 TABLETS (1,000MG ) BY MOUTH EVERY DAY ON AN EMPTY STOMACH 1 HOURBEFORE OR 2 HOURS AFTER  A MEAL 01/10/16   Wyatt Portela, MD    Family History Family History  Problem Relation Age of Onset  . Diabetes Father   . Diabetes Sister   . Hypertension Sister   . Hypertension Brother   . Heart disease Neg Hx     Social History Social History  Substance Use Topics  . Smoking status: Former Smoker    Years: 10.00    Quit date: 2000  . Smokeless tobacco: Never Used  . Alcohol use No     Allergies   Patient has no known allergies.   Review of Systems Review of Systems  All other systems reviewed and are negative.    Physical Exam Updated Vital Signs BP 107/55   Pulse 89   Temp 99.1 F (37.3 C) (Oral)   Resp (!) 28   SpO2 95%   Physical Exam  Constitutional: He appears well-developed and well-nourished. No distress.  HENT:  Head: Normocephalic and atraumatic.  Mouth/Throat: Oropharynx is clear and moist.  Eyes:  Panel conjunctiva  Neck: Normal range of motion. Neck supple.  Cardiovascular: Normal rate, regular rhythm and intact distal pulses.   No murmur heard. Pulmonary/Chest: Effort normal and breath sounds normal. No respiratory distress. He has no wheezes. He has no rales.  Abdominal: Soft. He exhibits no distension. There is no tenderness. There is no rebound and no guarding.  Musculoskeletal: Normal range of motion. He exhibits no edema or tenderness.  Neurological: He is alert.  Skin: Skin is warm and dry. No rash noted. No erythema.  Psychiatric: He has a normal mood and affect. His behavior is normal.  Nursing note and vitals reviewed.    ED Treatments / Results  Labs (all labs ordered are listed, but only abnormal results are displayed) Labs Reviewed  CBC WITH DIFFERENTIAL/PLATELET - Abnormal; Notable for the following:       Result Value   WBC 14.9 (*)    RBC 2.52 (*)    Hemoglobin 7.1 (*)    HCT 21.3 (*)    RDW 15.6 (*)    Platelets 535 (*)    Neutro Abs 12.8 (*)    All other components within normal limits  BASIC METABOLIC  PANEL - Abnormal; Notable for the following:    Potassium 2.7 (*)    Glucose, Bld 233 (*)    BUN 84 (*)    Creatinine, Ser 3.65 (*)    Calcium 7.5 (*)    GFR calc non Af Amer 15 (*)    GFR calc Af Amer 17 (*)    All other components within normal limits  POC OCCULT BLOOD, ED - Abnormal; Notable for the following:    Fecal Occult Bld POSITIVE (*)    All other components within normal limits  PROTIME-INR  URINALYSIS, ROUTINE W REFLEX MICROSCOPIC (NOT AT  [facility])  TYPE AND SCREEN  PREPARE RBC (CROSSMATCH)    EKG  EKG Interpretation None       Radiology No results found.  Procedures Procedures (including critical care time)  Medications Ordered in ED Medications - No data to display   Initial Impression / Assessment and Plan / ED Course  I have reviewed the triage vital signs and the nursing notes.  Pertinent labs & imaging results that were available during my care of the patient were reviewed by me and considered in my medical decision making (see chart for details).  Clinical Course    Patient being sent in today for potential anemia. Unclear what hemoglobin was on outpatient lab test the patient currently has no complaints. Patient is on Coumadin but last INR approximately 2-1/2 weeks ago was 1.28.  Patient was admitted on 01/28/2016 for a left lower lobe pneumonia and generalized weakness. During that hospitalization he was found to have normocytic normochromic anemia without any signs of acute bleeding. She did receive blood transfusion while hospitalized and was then started on iron.  Hemoglobin prior to discharge was 8.2 and that was 13 days ago. Will recheck today.  9:49 PM Found to be anemic down to 7. Also new leukocytosis of almost 15,000. Patient does have a temperature of 99 and will check urine to ensure no sign of infection. He denies any cough and has no signs of cellulitis or decubitus wounds. BMP with new acute kidney injury with creatinine of 3.65 and  hypokalemia of 2.7. Patient will be given IV fluids and potassium. He will also be transfused 2 units of blood. Hemoccult positive and spoke with Dr. Havery Moros with GI who will see patient in the morning. Patient admitted for further care.  CRITICAL CARE Performed by: Blanchie Dessert Total critical care time: 30 minutes Critical care time was exclusive of separately billable procedures and treating other patients. Critical care was necessary to treat or prevent imminent or life-threatening deterioration. Critical care was time spent personally by me on the following activities: development of treatment plan with patient and/or surrogate as well as nursing, discussions with consultants, evaluation of patient's response to treatment, examination of patient, obtaining history from patient or surrogate, ordering and performing treatments and interventions, ordering and review of laboratory studies, ordering and review of radiographic studies, pulse oximetry and re-evaluation of patient's condition.  Final Clinical Impressions(s) / ED Diagnoses   Final diagnoses:  Gastrointestinal hemorrhage, unspecified gastrointestinal hemorrhage type  Acute kidney injury ([place])  Hypokalemia  Anemia, unspecified type    New Prescriptions New Prescriptions   No medications on file     Blanchie Dessert, MD 02/15/16 2150

## 2016-02-15 NOTE — ED Notes (Signed)
Bed: [id number] Expected date:  Expected time:  Means of arrival:  Comments: Ems low hgb

## 2016-02-15 NOTE — Progress Notes (Signed)
Pt was lying in bed and awake when [place] arrived. His son and daughter-in-law were bedside. Pt wanted his daughter-in-law to call his wife. He seemed concerned about her absence. Daughter-in-law explained his wife has a fever and does not want to come to the hospital while it is present. Pt's daughter-in-law wanted to talk one-on-one. She explained pt's stage 4 cxr and that they have been approached about palliative care. She is concerned about pt's condition and would like more information about palliative care and what that means for the family. She said her brother-in-law will be here at 59 from [place]. Pt's daughter also requested prayer for pt and family. Pt was very appreciative of prayer and visit. Please page if add'tl spt is needed. Chaplain Ernest Haber, M.Div.   02/15/16 2000  Clinical Encounter Type  Visited With Patient and family together

## 2016-02-15 NOTE — ED Notes (Signed)
Pt reminded of need for urine sample.  

## 2016-02-16 ENCOUNTER — Encounter (HOSPITAL_COMMUNITY): Payer: Self-pay | Admitting: Internal Medicine

## 2016-02-16 DIAGNOSIS — K2970 Gastritis, unspecified, without bleeding: Secondary | ICD-10-CM | POA: Diagnosis not present

## 2016-02-16 DIAGNOSIS — D62 Acute posthemorrhagic anemia: Secondary | ICD-10-CM

## 2016-02-16 DIAGNOSIS — E876 Hypokalemia: Secondary | ICD-10-CM | POA: Diagnosis not present

## 2016-02-16 DIAGNOSIS — K209 Esophagitis, unspecified: Secondary | ICD-10-CM | POA: Diagnosis not present

## 2016-02-16 DIAGNOSIS — A419 Sepsis, unspecified organism: Secondary | ICD-10-CM | POA: Diagnosis not present

## 2016-02-16 DIAGNOSIS — T18128A Food in esophagus causing other injury, initial encounter: Secondary | ICD-10-CM | POA: Diagnosis not present

## 2016-02-16 DIAGNOSIS — N184 Chronic kidney disease, stage 4 (severe): Secondary | ICD-10-CM | POA: Diagnosis not present

## 2016-02-16 DIAGNOSIS — K259 Gastric ulcer, unspecified as acute or chronic, without hemorrhage or perforation: Secondary | ICD-10-CM | POA: Diagnosis not present

## 2016-02-16 DIAGNOSIS — I10 Essential (primary) hypertension: Secondary | ICD-10-CM | POA: Diagnosis not present

## 2016-02-16 DIAGNOSIS — R1314 Dysphagia, pharyngoesophageal phase: Secondary | ICD-10-CM | POA: Diagnosis not present

## 2016-02-16 DIAGNOSIS — Z7901 Long term (current) use of anticoagulants: Secondary | ICD-10-CM | POA: Diagnosis not present

## 2016-02-16 DIAGNOSIS — D649 Anemia, unspecified: Secondary | ICD-10-CM | POA: Diagnosis not present

## 2016-02-16 DIAGNOSIS — R531 Weakness: Secondary | ICD-10-CM | POA: Diagnosis not present

## 2016-02-16 DIAGNOSIS — N179 Acute kidney failure, unspecified: Secondary | ICD-10-CM | POA: Diagnosis not present

## 2016-02-16 DIAGNOSIS — L89152 Pressure ulcer of sacral region, stage 2: Secondary | ICD-10-CM | POA: Diagnosis not present

## 2016-02-16 DIAGNOSIS — R1310 Dysphagia, unspecified: Secondary | ICD-10-CM | POA: Diagnosis not present

## 2016-02-16 DIAGNOSIS — I82501 Chronic embolism and thrombosis of unspecified deep veins of right lower extremity: Secondary | ICD-10-CM | POA: Diagnosis present

## 2016-02-16 DIAGNOSIS — T18128D Food in esophagus causing other injury, subsequent encounter: Secondary | ICD-10-CM | POA: Diagnosis not present

## 2016-02-16 DIAGNOSIS — E46 Unspecified protein-calorie malnutrition: Secondary | ICD-10-CM | POA: Diagnosis not present

## 2016-02-16 DIAGNOSIS — I5032 Chronic diastolic (congestive) heart failure: Secondary | ICD-10-CM | POA: Diagnosis present

## 2016-02-16 DIAGNOSIS — K219 Gastro-esophageal reflux disease without esophagitis: Secondary | ICD-10-CM | POA: Diagnosis not present

## 2016-02-16 DIAGNOSIS — K2211 Ulcer of esophagus with bleeding: Secondary | ICD-10-CM | POA: Diagnosis not present

## 2016-02-16 DIAGNOSIS — E870 Hyperosmolality and hypernatremia: Secondary | ICD-10-CM | POA: Diagnosis not present

## 2016-02-16 DIAGNOSIS — J9811 Atelectasis: Secondary | ICD-10-CM | POA: Diagnosis not present

## 2016-02-16 DIAGNOSIS — R195 Other fecal abnormalities: Secondary | ICD-10-CM | POA: Diagnosis not present

## 2016-02-16 DIAGNOSIS — D509 Iron deficiency anemia, unspecified: Secondary | ICD-10-CM | POA: Diagnosis not present

## 2016-02-16 DIAGNOSIS — I509 Heart failure, unspecified: Secondary | ICD-10-CM | POA: Diagnosis not present

## 2016-02-16 DIAGNOSIS — K922 Gastrointestinal hemorrhage, unspecified: Secondary | ICD-10-CM | POA: Diagnosis not present

## 2016-02-16 DIAGNOSIS — J690 Pneumonitis due to inhalation of food and vomit: Secondary | ICD-10-CM | POA: Diagnosis not present

## 2016-02-16 DIAGNOSIS — R112 Nausea with vomiting, unspecified: Secondary | ICD-10-CM | POA: Diagnosis not present

## 2016-02-16 DIAGNOSIS — R2689 Other abnormalities of gait and mobility: Secondary | ICD-10-CM | POA: Diagnosis not present

## 2016-02-16 DIAGNOSIS — C61 Malignant neoplasm of prostate: Secondary | ICD-10-CM | POA: Diagnosis not present

## 2016-02-16 DIAGNOSIS — K228 Other specified diseases of esophagus: Secondary | ICD-10-CM | POA: Diagnosis not present

## 2016-02-16 DIAGNOSIS — E785 Hyperlipidemia, unspecified: Secondary | ICD-10-CM | POA: Diagnosis present

## 2016-02-16 DIAGNOSIS — Z7984 Long term (current) use of oral hypoglycemic drugs: Secondary | ICD-10-CM | POA: Diagnosis not present

## 2016-02-16 DIAGNOSIS — E119 Type 2 diabetes mellitus without complications: Secondary | ICD-10-CM | POA: Diagnosis not present

## 2016-02-16 DIAGNOSIS — Z923 Personal history of irradiation: Secondary | ICD-10-CM | POA: Diagnosis not present

## 2016-02-16 DIAGNOSIS — I959 Hypotension, unspecified: Secondary | ICD-10-CM | POA: Diagnosis not present

## 2016-02-16 DIAGNOSIS — M6281 Muscle weakness (generalized): Secondary | ICD-10-CM | POA: Diagnosis not present

## 2016-02-16 DIAGNOSIS — J189 Pneumonia, unspecified organism: Secondary | ICD-10-CM | POA: Diagnosis not present

## 2016-02-16 DIAGNOSIS — E860 Dehydration: Secondary | ICD-10-CM | POA: Diagnosis not present

## 2016-02-16 DIAGNOSIS — F0390 Unspecified dementia without behavioral disturbance: Secondary | ICD-10-CM | POA: Diagnosis present

## 2016-02-16 DIAGNOSIS — B3781 Candidal esophagitis: Secondary | ICD-10-CM | POA: Diagnosis not present

## 2016-02-16 DIAGNOSIS — I130 Hypertensive heart and chronic kidney disease with heart failure and stage 1 through stage 4 chronic kidney disease, or unspecified chronic kidney disease: Secondary | ICD-10-CM | POA: Diagnosis not present

## 2016-02-16 DIAGNOSIS — E1122 Type 2 diabetes mellitus with diabetic chronic kidney disease: Secondary | ICD-10-CM | POA: Diagnosis present

## 2016-02-16 DIAGNOSIS — E784 Other hyperlipidemia: Secondary | ICD-10-CM | POA: Diagnosis not present

## 2016-02-16 DIAGNOSIS — E2740 Unspecified adrenocortical insufficiency: Secondary | ICD-10-CM | POA: Diagnosis present

## 2016-02-16 DIAGNOSIS — K220 Achalasia of cardia: Secondary | ICD-10-CM | POA: Diagnosis not present

## 2016-02-16 DIAGNOSIS — E43 Unspecified severe protein-calorie malnutrition: Secondary | ICD-10-CM | POA: Diagnosis not present

## 2016-02-16 LAB — CBC
HCT: 28.2 % — ABNORMAL LOW (ref 39.0–52.0)
HEMOGLOBIN: 9.5 g/dL — AB (ref 13.0–17.0)
MCH: 28.5 pg (ref 26.0–34.0)
MCHC: 33.7 g/dL (ref 30.0–36.0)
MCV: 84.7 fL (ref 78.0–100.0)
Platelets: 458 10*3/uL — ABNORMAL HIGH (ref 150–400)
RBC: 3.33 MIL/uL — AB (ref 4.22–5.81)
RDW: 14.9 % (ref 11.5–15.5)
WBC: 15.5 10*3/uL — ABNORMAL HIGH (ref 4.0–10.5)

## 2016-02-16 LAB — TYPE AND SCREEN
ABO/RH(D): B POS
ANTIBODY SCREEN: NEGATIVE
UNIT DIVISION: 0
Unit division: 0

## 2016-02-16 LAB — BASIC METABOLIC PANEL
ANION GAP: 8 (ref 5–15)
BUN: 77 mg/dL — ABNORMAL HIGH (ref 6–20)
CHLORIDE: 110 mmol/L (ref 101–111)
CO2: 26 mmol/L (ref 22–32)
Calcium: 7.4 mg/dL — ABNORMAL LOW (ref 8.9–10.3)
Creatinine, Ser: 3.27 mg/dL — ABNORMAL HIGH (ref 0.61–1.24)
GFR calc non Af Amer: 17 mL/min — ABNORMAL LOW (ref >60)
GFR, EST AFRICAN AMERICAN: 20 mL/min — AB (ref >60)
Glucose, Bld: 191 mg/dL — ABNORMAL HIGH (ref 65–99)
POTASSIUM: 3.2 mmol/L — AB (ref 3.5–5.1)
Sodium: 144 mmol/L (ref 135–145)

## 2016-02-16 LAB — GLUCOSE, CAPILLARY
GLUCOSE-CAPILLARY: 132 mg/dL — AB (ref 65–99)
Glucose-Capillary: 148 mg/dL — ABNORMAL HIGH (ref 65–99)
Glucose-Capillary: 137 mg/dL — ABNORMAL HIGH (ref 65–99)
Glucose-Capillary: 132 mg/dL — ABNORMAL HIGH (ref 65–99)

## 2016-02-16 LAB — MRSA PCR SCREENING: MRSA BY PCR: NEGATIVE

## 2016-02-16 MED ORDER — FUROSEMIDE 40 MG PO TABS
40.0000 mg | ORAL_TABLET | Freq: Every day | ORAL | Status: DC
  Administered 2016-02-16 – 2016-02-19 (×4): 40 mg via ORAL
  Filled 2016-02-16 (×5): qty 1

## 2016-02-16 MED ORDER — PANTOPRAZOLE SODIUM 40 MG IV SOLR: 40.0000 mg | Freq: Two times a day (BID) | INTRAVENOUS | Status: DC

## 2016-02-16 MED ORDER — ACETAMINOPHEN 325 MG PO TABS
650.0000 mg | ORAL_TABLET | Freq: Four times a day (QID) | ORAL | Status: DC | PRN
  Administered 2016-02-16 – 2016-02-18 (×3): 650 mg via ORAL
  Filled 2016-02-16 (×3): qty 2

## 2016-02-16 MED ORDER — MORPHINE SULFATE (PF) 2 MG/ML IV SOLN
2.0000 mg | Freq: Once | INTRAVENOUS | Status: AC | PRN
  Administered 2016-02-16: 2 mg via INTRAVENOUS
  Filled 2016-02-16: qty 1

## 2016-02-16 MED ORDER — HYDROCODONE-ACETAMINOPHEN 5-325 MG PO TABS: 1.0000 | ORAL_TABLET | Freq: Once | ORAL | Status: DC | PRN

## 2016-02-16 MED ORDER — FERROUS SULFATE 325 (65 FE) MG PO TABS
325.0000 mg | ORAL_TABLET | Freq: Every day | ORAL | Status: DC
  Administered 2016-02-16 – 2016-02-19 (×3): 325 mg via ORAL
  Filled 2016-02-16 (×4): qty 1

## 2016-02-16 MED ORDER — PANTOPRAZOLE SODIUM 40 MG PO TBEC
40.0000 mg | DELAYED_RELEASE_TABLET | Freq: Every day | ORAL | Status: DC
  Administered 2016-02-17: 40 mg via ORAL
  Filled 2016-02-16: qty 1

## 2016-02-16 MED ORDER — ATENOLOL 25 MG PO TABS
25.0000 mg | ORAL_TABLET | Freq: Every day | ORAL | Status: DC
  Administered 2016-02-16 – 2016-02-19 (×3): 25 mg via ORAL
  Filled 2016-02-16 (×4): qty 1

## 2016-02-16 MED ORDER — ATORVASTATIN CALCIUM 10 MG PO TABS
10.0000 mg | ORAL_TABLET | Freq: Every day | ORAL | Status: DC
  Administered 2016-02-16 – 2016-02-20 (×5): 10 mg via ORAL
  Filled 2016-02-16 (×5): qty 1

## 2016-02-16 MED ORDER — INSULIN ASPART 100 UNIT/ML ~~LOC~~ SOLN
0.0000 [IU] | Freq: Three times a day (TID) | SUBCUTANEOUS | Status: DC
  Administered 2016-02-16 – 2016-02-17 (×4): 1 [IU] via SUBCUTANEOUS
  Administered 2016-02-18: 2 [IU] via SUBCUTANEOUS
  Administered 2016-02-18: 1 [IU] via SUBCUTANEOUS

## 2016-02-16 MED ORDER — ACETAMINOPHEN 650 MG RE SUPP
650.0000 mg | Freq: Four times a day (QID) | RECTAL | Status: DC | PRN
  Administered 2016-02-21 – 2016-02-22 (×2): 650 mg via RECTAL
  Filled 2016-02-16 (×2): qty 1

## 2016-02-16 MED ORDER — POTASSIUM CHLORIDE CRYS ER 20 MEQ PO TBCR
40.0000 meq | EXTENDED_RELEASE_TABLET | Freq: Two times a day (BID) | ORAL | Status: DC
  Administered 2016-02-16: 40 meq via ORAL
  Filled 2016-02-16 (×2): qty 2

## 2016-02-16 NOTE — Consult Note (Signed)
[facility] Consult: 8:52 AM 02/16/2016  LOS: 0 days    Referring Provider: Dr Aileen Fass  Primary Care Physician:  [name], FNP Primary Gastroenterologist:  Dr. Scarlette Shorts, no visits since 2009    Reason for Consultation:  FOBT + anemia.    HPI: [name] is a 75 y.o. male.  PMH DM2 on oral agents.  HTN.  HLD.  Metastatic prostate cancer, original Dx 2006; treated with radiation seed implantation as well as androgen deprivation therapy. PSA 38 01/25/16, bone scan 11/2015 with recurrence in prostate.  Started Zitiga 12/2015 but the dose was reduced and later discontinued by Dr. Alen Blew because of weakness, anorexia, weight loss.  Large HH per chest x ray.  Colonoscopy 2009: scattered pan-diverticulosis and prolapse type small rectal polyp. Esophageal manometry 1984 sugg of achalasia.   10/21 - 02/02/16 admission from home with weakness, anorexia, unsteady gait.  Imaging with ? PNA, sclerotic lesion in C 3 (? Mets). Diagnosed with new RLE DVT.  Discharge to SNF on Lovenox and po iron.  Received PRBC x 1 for heme negative anemia, Hgb 6.8.  Hgb up to 8.2 at discharge.  At the SNF/rehabilitation facility patient has mostly been in bed. He requires full assist in order to stand or walk. Appetite has improved but is still diminished. Patient denies N/V.  No reports of melena or blood per rectum.  During the recent admission he suffered from some constipation but this is not an issue currently. No abdominal pain. Denies dysphagia, heartburn. No previous blood transfusions. He  Referred to ED by SNF for Hgb of 7.1.  Stool now FOBT + but no hx of melena, or BPR.   Hgb 9.5 after PRBC x 2.   CXR with large HH.  No PNA or CHF.   No PPI etc PTA.     Past Medical History:  Diagnosis Date  . Cataract   . CKD  (chronic kidney disease) 02/2016  . Diabetes mellitus, type 2 ([place]) 2008  . Hyperlipidemia 2008  . Hypertension 2008  . Prostate cancer ([place]) 2006    Past Surgical History:  Procedure Laterality Date  . COLONOSCOPY W/ POLYPECTOMY  03/2008   diminutive rectal polyp (path: prolapse type polyp, not adenoma).  moderate pan-diverticulitis.   Marland Kitchen ESOPHAGEAL MANOMETRY  1984   unable to pass manometry catheter beyond LES. Aperiystalsis of esophagus: sugg of achalasia.   Marland Kitchen PROSTATE SURGERY     radiation therapy only    Prior to Admission medications   Medication Sig Start Date End Date Taking? Authorizing Provider  acetaminophen (TYLENOL) 650 MG CR tablet Take 650 mg by mouth every 4 (four) hours as needed for pain.   Yes Historical Provider, MD  atenolol (TENORMIN) 25 MG tablet Take 25 mg by mouth daily.   Yes Historical Provider, MD  atorvastatin (LIPITOR) 10 MG tablet Take 1 tablet (10 mg total) by mouth daily after supper. 10/10/15  Yes Elayne Snare, MD  docusate sodium (COLACE) 100 MG capsule Take 1 capsule (100 mg total) by mouth 2 (two)  times daily. 02/02/16  Yes Donne Hazel, MD  enoxaparin (LOVENOX) 60 MG/0.6ML injection Inject 0.6 mLs (60 mg total) into the skin daily. 02/02/16  Yes Donne Hazel, MD  feeding supplement, GLUCERNA SHAKE, (GLUCERNA SHAKE) LIQD Take 237 mLs by mouth 2 (two) times daily between meals.   Yes Historical Provider, MD  ferrous sulfate 325 (65 FE) MG tablet Take 1 tablet (325 mg total) by mouth daily with breakfast. 02/02/16  Yes Donne Hazel, MD  furosemide (LASIX) 40 MG tablet Take 40 mg by mouth daily.  01/11/16  Yes Historical Provider, MD  glipiZIDE (GLUCOTROL XL) 2.5 MG 24 hr tablet Take 2.5 mg by mouth daily with breakfast.   Yes Historical Provider, MD  ONE TOUCH ULTRA TEST test strip USE TO TEST ONCE DAILY 08/29/15  Yes Elayne Snare, MD  [facility] DELICA LANCETS 99991111 [place] USE TO TEST ONCE DAILY Dx code E11.65 01/04/16  Yes Elayne Snare, MD  pioglitazone (ACTOS)  30 MG tablet TAKE 1 TABLET BY MOUTH EVERY DAY Patient taking differently: TAKE 30mg  TABLET BY MOUTH EVERY DAY 01/02/16  Yes Elayne Snare, MD  polyethylene glycol (MIRALAX / GLYCOLAX) packet Take 17 g by mouth daily. 02/03/16  Yes Donne Hazel, MD  potassium chloride SA (KLOR-CON M20) 20 MEQ tablet Take 1 tablet (20 mEq total) by mouth daily. 09/01/15  Yes [name], FNP  TRADJENTA 5 MG TABS tablet TAKE 1 TABLET (5 MG TOTAL) BY MOUTH DAILY. 11/28/15  Yes Elayne Snare, MD  GLIPIZIDE XL 5 MG 24 hr tablet TAKE 1 TABLET BY MOUTH DAILY WITH BREAKFAST. Patient not taking: Reported on 02/15/2016 11/24/15   Elayne Snare, MD  levofloxacin (LEVAQUIN) 750 MG tablet Take 1 tablet (750 mg total) by mouth every other day. Patient not taking: Reported on 02/15/2016 02/03/16   Donne Hazel, MD  ZYTIGA 250 MG tablet TAKE 4 TABLETS (1,000MG ) BY MOUTH EVERY DAY ON AN EMPTY STOMACH 1 HOURBEFORE OR 2 HOURS AFTER A MEAL Patient not taking: Reported on 02/15/2016 01/10/16   Wyatt Portela, MD    Scheduled Meds: . atenolol  25 mg Oral Daily  . atorvastatin  10 mg Oral QPC supper  . ferrous sulfate  325 mg Oral Q breakfast  . insulin aspart  0-9 Units Subcutaneous TID WC  . [START ON 02/19/2016] pantoprazole  40 mg Intravenous Q12H   Infusions:  PRN Meds: acetaminophen **OR** acetaminophen   Allergies as of 02/15/2016  . (No Known Allergies)    Family History  Problem Relation Age of Onset  . Diabetes Father   . Diabetes Sister   . Hypertension Sister   . Hypertension Brother   . Heart disease Neg Hx     Social History   Social History  . Marital status: Married    Spouse name: N/A  . Number of children: N/A  . Years of education: N/A   Occupational History  . retired    Social History Main Topics  . Smoking status: Former Smoker    Years: 10.00    Quit date: 2000  . Smokeless tobacco: Never Used  . Alcohol use No  . Drug use: No  . Sexual activity: Not on file   Other Topics Concern  .  Not on file   Social History Narrative  . No narrative on file    REVIEW OF SYSTEMS: Constitutional:  Weakness. ENT:  No nose bleeds Pulm:  Denies cough or dyspnea. No orthopnea. CV:  No palpitations, no  LE edema. No chest pain GU:  No hematuria, no frequency GI:  Per HPI Heme:  No unusual bleeding or bruising. No previous history of anemia per his recall.   Transfusions:  Patient and his son recall no previous transfusions prior to 10 2017 Neuro:  No headaches, no peripheral tingling or numbness Derm:  No itching, no rash or sores.  Endocrine:  No sweats or chills.  No polyuria or dysuria Immunization: Vaccination record reviewed area he is up-to-date on his flu shot and multiple other indicated vaccinations. Travel:  None beyond local counties in last few months.    PHYSICAL EXAM: Vital signs in last 24 hours: Vitals:   02/16/16 0353 02/16/16 0515  BP: (!) 115/49 109/71  Pulse: 76 78  Resp: 16 14  Temp: 98 F (36.7 C) 98 F (36.7 C)   Wt Readings from Last 3 Encounters:  02/16/16 67.6 kg (149 lb)  01/29/16 65 kg (143 lb 4.8 oz)  01/25/16 64.4 kg (142 lb)    General: Chronically ill, cachectic-appearing elderly AAM. Currently comfortable. Head:  No facial asymmetry or swelling.  Eyes:  No scleral icterus or conjunctival pallor. Ears:  Not clear if he is hard of hearing or he is just slow to respond.  Nose:  No congestion or discharge. Mouth:  Mucous membranes dry. He has 3 intact teeth on the front lower jaw. Tongue is midline. Neck:  No mass. No JVD. No TMG. Lungs:  No labored breathing or cough. Lungs with diminished but clear breath sounds. Heart: RRR. No MRG. Abdomen:  Soft. Not tender. Not distended. Some fullness in the upper abdomen but no distinct hepatosplenomegaly or splenomegaly. Fine gauge to tubing present in the lower abdomen, suprapubic this. Not clear what this is after discussing with nurse and reviewing the medical record. Don't know if this is  subcutaneous tubing..   Rectal: Deferred rectal exam.   Musc/Skeltl: No joint erythema or swelling. Some kyphosis. Extremities:  Slight swelling in the feet bilaterally. No tenderness to the swelling. Both feet are warm and well perfused.  Neurologic/psych:  Patient is alert. His speech, perhaps due to lack of dentition, is muffled and hard to understand. He is oriented 3. He can move all of his limbs. No tremor present.  He is appropriate. He is calm. Skin:  No rashes, sores or suspicious lesions     Intake/Output from previous day: 11/08 0701 - 11/09 0700 In: 2170 [I.V.:1000; Blood:670; IV [id number] Out: -  Intake/Output this shift: No intake/output data recorded.  LAB RESULTS:  Recent Labs  02/15/16 2011 02/16/16 0529  WBC 14.9* 15.5*  HGB 7.1* 9.5*  HCT 21.3* 28.2*  PLT 535* 458*   BMET Lab Results  Component Value Date   NA 144 02/16/2016   NA 140 02/15/2016   NA 140 02/02/2016   K 3.2 (L) 02/16/2016   K 2.7 (LL) 02/15/2016   K 3.6 02/02/2016   CL 110 02/16/2016   CL 104 02/15/2016   CL 106 02/02/2016   CO2 26 02/16/2016   CO2 26 02/15/2016   CO2 25 02/02/2016   GLUCOSE 191 (H) 02/16/2016   GLUCOSE 233 (H) 02/15/2016   GLUCOSE 138 (H) 02/02/2016   BUN 77 (H) 02/16/2016   BUN 84 (H) 02/15/2016   BUN 47 (H) 02/02/2016   CREATININE 3.27 (H) 02/16/2016   CREATININE 3.65 (H) 02/15/2016   CREATININE 2.88 (H) 02/02/2016   CALCIUM 7.4 (L) 02/16/2016   CALCIUM 7.5 (L) 02/15/2016   CALCIUM 8.2 (  L) 02/02/2016   LFT No results for input(s): PROT, ALBUMIN, AST, ALT, ALKPHOS, BILITOT, BILIDIR, IBILI in the last 72 hours. PT/INR Lab Results  Component Value Date   INR 1.03 02/15/2016   INR 1.28 01/28/2016   INR 2.7 11/02/2011   Hepatitis Panel No results for input(s): HEPBSAG, HCVAB, HEPAIGM, HEPBIGM in the last 72 hours. C-Diff No components found for: CDIFF Lipase     Component Value Date/Time   LIPASE 30 01/08/2016 2145    Drugs of Abuse  No  results found for: LABOPIA, COCAINSCRNUR, LABBENZ, AMPHETMU, THCU, LABBARB   RADIOLOGY STUDIES: No results found.   IMPRESSION:   *  FOBT + anemia, no overt GI bleeding. Started 2 plus weeks ago on Lovenox for new LE DVT.  However, FOBT negative anemia predates the Lovenox. S/p PRBC x 1 in 01/2016 and x 2 overnight.  Large HH on chest imaging.  ? Achalasia on remote 1984 esophageal manometry.  Benign colon polyp and diverticulosis on 2009 screening colonoscopy.  *  LE DVT Dxd 2 to 3 weeks ago.   *   Metastatic prostate cancer. Prostate cancer recurrent on bone scan 11/2015. Previous radiation seed implantation and [facility] androgen deprivation therapy, Zytiga 12/2015 - 01/2016.  *  CKD stage 4.  Certainly contributing to anemia.    PLAN:     *  EGD?  Will d/w Dr Ardis Hughs.  For now can resume diet.    *  Switch to oral PPI  *  ? Place IVC filter and stop Lovenox permanently?    Azucena Freed  02/16/2016, 8:52 AM Pager: [phone]  ________________________________________________________________________  Velora Heckler GI MD note:  I personally examined the patient, reviewed the data and agree with the assessment and plan described above.  He is pretty frail, has metastatic prostate cancer, mild dementia.  Long discussion with him and two sons.  Planning for EGD tomorrow. If this does not show clear etiology of his anemia, probably will not proceed with colonoscopy given frailty, cancer, etc.  He had PET scan recently and I would expect that to have pretty well ruled out advanced colon neoplasm since nothing was PET avid in the colon.     Owens Loffler, MD [facility] Pager [phone]

## 2016-02-16 NOTE — H&P (Signed)
History and Physical    [name] [id number] DOB: 1941-01-28 DOA: 02/15/2016  PCP: [name], FNP  Patient coming from: Nursing home.  Chief Complaint: Low hemoglobin.  HPI: [name] is a 75 y.o. male with history of metastatic prostate cancer, diabetes mellitus type 2, hypertension, anemia and chronic kidney disease was referred to the ER after patient's hemoglobin was found to be around 7. 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 During that stay patient also received packed red blood cell transfusion for anemia and cause was not clear. In the ER today patient's hemoglobin is found to be on 7 and at discharge it is around 8. Stool for occult blood is positive. Patient is hemodynamically stable. Patient's stool is not melanotic. Patient is being admitted for further observation for possible GI bleed. ER physician did discuss with Dr. Havery Moros on-call gastroenterologist, who will be seeing patient in consult. Patient is largely bedbound.   ED Course: 2 units of packed red blood cell transfusion has been ordered. GI consulted by ER physician. Stool for occult blood was positive. Creatinine has worsened from baseline.  Review of Systems: As per HPI, rest all negative.   Past Medical History:  Diagnosis Date  . Cataract   . Diabetes mellitus   . Hyperlipidemia   . Hypertension   . Prostate cancer ([place])   . Renal insufficiency     Past Surgical History:  Procedure Laterality Date  . PROSTATE SURGERY     radiation therapy only     reports that he quit smoking about 17 years ago. He quit after 10.00 years of use. He has never used smokeless tobacco. He reports that he does not drink alcohol or use drugs.  No Known Allergies  Family History  Problem Relation Age of Onset  .  Diabetes Father   . Diabetes Sister   . Hypertension Sister   . Hypertension Brother   . Heart disease Neg Hx     Prior to Admission medications   Medication Sig Start Date End Date Taking? Authorizing Provider  acetaminophen (TYLENOL) 650 MG CR tablet Take 650 mg by mouth every 4 (four) hours as needed for pain.   Yes Historical Provider, MD  atenolol (TENORMIN) 25 MG tablet Take 25 mg by mouth daily.   Yes Historical Provider, MD  atorvastatin (LIPITOR) 10 MG tablet Take 1 tablet (10 mg total) by mouth daily after supper. 10/10/15  Yes Elayne Snare, MD  docusate sodium (COLACE) 100 MG capsule Take 1 capsule (100 mg total) by mouth 2 (two) times daily. 02/02/16  Yes Donne Hazel, MD  enoxaparin (LOVENOX) 60 MG/0.6ML injection Inject 0.6 mLs (60 mg total) into the skin daily. 02/02/16  Yes Donne Hazel, MD  feeding supplement, GLUCERNA SHAKE, (GLUCERNA SHAKE) LIQD Take 237 mLs by mouth 2 (two) times daily between meals.   Yes Historical Provider, MD  ferrous sulfate 325 (65 FE) MG tablet Take 1 tablet (325 mg total) by mouth daily with breakfast. 02/02/16  Yes Donne Hazel, MD  furosemide (LASIX) 40 MG tablet Take 40 mg by mouth daily.  01/11/16  Yes Historical Provider, MD  glipiZIDE (GLUCOTROL XL) 2.5 MG 24 hr tablet Take 2.5 mg by mouth daily with breakfast.   Yes Historical Provider, MD  [facility]  ULTRA TEST test strip USE TO TEST ONCE DAILY 08/29/15  Yes Elayne Snare, MD  [facility] DELICA LANCETS [id number] MISC USE TO TEST ONCE DAILY Dx code E11.65 01/04/16  Yes Elayne Snare, MD  pioglitazone (ACTOS) 30 MG tablet TAKE 1 TABLET BY MOUTH EVERY DAY Patient taking differently: TAKE 30mg  TABLET BY MOUTH EVERY DAY 01/02/16  Yes Elayne Snare, MD  polyethylene glycol (MIRALAX / GLYCOLAX) packet Take 17 g by mouth daily. 02/03/16  Yes Donne Hazel, MD  potassium chloride SA (KLOR-CON M20) 20 MEQ tablet Take 1 tablet (20 mEq total) by mouth daily. 09/01/15  Yes [name], FNP  TRADJENTA 5 MG TABS tablet  TAKE 1 TABLET (5 MG TOTAL) BY MOUTH DAILY. 11/28/15  Yes Elayne Snare, MD  GLIPIZIDE XL 5 MG 24 hr tablet TAKE 1 TABLET BY MOUTH DAILY WITH BREAKFAST. Patient not taking: Reported on 02/15/2016 11/24/15   Elayne Snare, MD  levofloxacin (LEVAQUIN) 750 MG tablet Take 1 tablet (750 mg total) by mouth every other day. Patient not taking: Reported on 02/15/2016 02/03/16   Donne Hazel, MD  ZYTIGA 250 MG tablet TAKE 4 TABLETS (1,000MG ) BY MOUTH EVERY DAY ON AN EMPTY STOMACH 1 HOURBEFORE OR 2 HOURS AFTER A MEAL Patient not taking: Reported on 02/15/2016 01/10/16   Wyatt Portela, MD    Physical Exam: Vitals:   02/15/16 2327 02/15/16 2359 02/16/16 0014 02/16/16 0030  BP: 123/71 111/63 126/69 122/69  Pulse: 86 86 85 83  Resp: 20 (!) 28 24 22   Temp: 99 F (37.2 C) 99.2 F (37.3 C) 98.7 F (37.1 C)   TempSrc: Oral Oral Oral   SpO2:  95% 100% 100%      Constitutional: Moderately built and nourished. Vitals:   02/15/16 2327 02/15/16 2359 02/16/16 0014 02/16/16 0030  BP: 123/71 111/63 126/69 122/69  Pulse: 86 86 85 83  Resp: 20 (!) 28 24 22   Temp: 99 F (37.2 C) 99.2 F (37.3 C) 98.7 F (37.1 C)   TempSrc: Oral Oral Oral   SpO2:  95% 100% 100%   Eyes: Anicteric no pallor. ENMT: No discharge from the ears eyes nose and mouth. Neck: No mass felt. No neck rigidity. Respiratory: No rhonchi or crepitations. Cardiovascular: S1 and S2 heard. No murmurs appreciated. Abdomen: Soft nontender bowel sounds present. No guarding or rigidity. Musculoskeletal: No edema. No joint effusion. Skin: No rash. Skin appears warm. Neurologic: Alert awake oriented to name and place. Has difficulty moving lower extremities and patient states is chronic. Psychiatric: Appears normal. Normal affect.   Labs on Admission: I have personally reviewed following labs and imaging studies  CBC:  Recent Labs Lab 02/15/16 2011  WBC 14.9*  NEUTROABS 12.8*  HGB 7.1*  HCT 21.3*  MCV 84.5  PLT [id number]*   Basic Metabolic  Panel:  Recent Labs Lab 02/15/16 2054  NA 140  K 2.7*  CL 104  CO2 26  GLUCOSE 233*  BUN 84*  CREATININE 3.65*  CALCIUM 7.5*   GFR: CrCl cannot be calculated (Unknown ideal weight.). Liver Function Tests: No results for input(s): AST, ALT, ALKPHOS, BILITOT, PROT, ALBUMIN in the last 168 hours. No results for input(s): LIPASE, AMYLASE in the last 168 hours. No results for input(s): AMMONIA in the last 168 hours. Coagulation Profile:  Recent Labs Lab 02/15/16 2011  INR 1.03   Cardiac Enzymes: No results for input(s): CKTOTAL, CKMB, CKMBINDEX, TROPONINI in the last 168 hours. BNP (last 3 results) No results for input(s): PROBNP in  the last 8760 hours. HbA1C: No results for input(s): HGBA1C in the last 72 hours. CBG: No results for input(s): GLUCAP in the last 168 hours. Lipid Profile: No results for input(s): CHOL, HDL, LDLCALC, TRIG, CHOLHDL, LDLDIRECT in the last 72 hours. Thyroid Function Tests: No results for input(s): TSH, T4TOTAL, FREET4, T3FREE, THYROIDAB in the last 72 hours. Anemia Panel: No results for input(s): VITAMINB12, FOLATE, FERRITIN, TIBC, IRON, RETICCTPCT in the last 72 hours. Urine analysis:    Component Value Date/Time   COLORURINE YELLOW 02/15/2016 2045   APPEARANCEUR CLOUDY (A) 02/15/2016 2045   LABSPEC 1.017 02/15/2016 2045   PHURINE 5.5 02/15/2016 2045   GLUCOSEU NEGATIVE 02/15/2016 2045   GLUCOSEU NEGATIVE 09/22/2014 0757   HGBUR NEGATIVE 02/15/2016 2045   BILIRUBINUR NEGATIVE 02/15/2016 2045   BILIRUBINUR n 04/30/2011 1534   KETONESUR NEGATIVE 02/15/2016 2045   PROTEINUR NEGATIVE 02/15/2016 2045   UROBILINOGEN 0.2 09/22/2014 0757   NITRITE NEGATIVE 02/15/2016 2045   LEUKOCYTESUR NEGATIVE 02/15/2016 2045   Sepsis Labs: @LABRCNTIP (procalcitonin:4,lacticidven:4) )No results found for this or any previous visit (from the past 240 hour(s)).   Radiological Exams on Admission: No results found.   Assessment/Plan Principal Problem:    Acute GI bleeding Active Problems:   Essential hypertension   PROSTATE CANCER, HX OF   Anemia   Acute blood loss anemia    1. Acute GI bleed - source not clear. Patient is hemodynamically stable. 2 units of packed red blood cell transfusion was ordered by ER physician. Follow CBC after transfusion. For now we will hold off Lovenox until we make sure patient is not bleeding profusely. I have placed patient on Protonix and nothing by mouth except medications. Gastroenterologist Dr. Enis Gash will be seeing patient in consult. 2. Acute blood loss anemia - follow CBC after transfusion. 3. Acute on chronic renal failure probably stage 3-4 - holding Lasix for now. I think creatinine will improve with transfusion. Recheck metabolic panel in a.m. Follow urinalysis. 4. 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 If patient does have significant bleed and may consider IVC filter. Will wait for GI recommendations. 5. Hypertension - continue present medications. 6. Diabetes mellitus type 2 - since patient is nothing by mouth I have placed patient on sliding scale coverage. Hold off oral anti-diabetic medications. 7. Prostate cancer - per oncologist.   DVT prophylaxis: Patient is not on pharmacological DVT prophylaxis or SCDs due to GI bleed and recently diagnosed DVT. Code Status: Full code.  Family Communication: Unable to reach patient's son I tried to call through the phone number provided.  Disposition Plan: Nursing home.  Consults called: Gastroenterologist.  Admission status: Observation.    Rise Patience MD [facility] Pager [phone].  If 7PM-7AM, please contact night-coverage [email or web address] Password [email or web address]  02/16/2016, 12:56 AM

## 2016-02-16 NOTE — Clinical Social Work Note (Signed)
Clinical Social Work Assessment  Patient Details  Name: [name] MRN: [id number] Date of Birth: 1941-02-13  Date of referral:  02/16/16               Reason for consult:  Discharge Planning                Permission sought to share information with:  Family Supports, Customer service manager, Case Optician, dispensing granted to share information::  Yes, Verbal Permission Granted  Name::      ([place] Tax adviser )  Agency::   [facility] )  Relationship::   (Son )  Contact Information:   [phone])  Housing/Transportation Living arrangements for the past 2 months:  [place] of Information:  Adult Children Patient Interpreter Needed:  None Criminal Activity/Legal Involvement Pertinent to Current Situation/Hospitalization:  No - Comment as needed Significant Relationships:  Adult Children, Spouse Lives with:  Spouse Do you feel safe going back to the place where you live?  Yes Need for family participation in patient care:  Yes (Comment)  Care giving concerns:  Patient was discharged from [facility] on 10/26 to SNF, [facility] after 2 weeks of rehab.    Social Worker assessment / plan: MSW met with patient and family around bedside in reference to patient being admitted from SNF, [facility]. Patient's son reported that patient does not like SNF however family believes that facility is dong a good job caring for patient. Son and family prefers patient to return to [facility] at discharge. No further concerns reported by patient or family at this time. MSW remains available as needed.   Employment status:  Retired Forensic scientist:  Medicare PT Recommendations:  Not assessed at this time [place] / Referral to community resources:  [place]  Patient/Family's Response to care:  Patient alert and oriented. Family agreeable to pt's return to SNF. Patient will a number of family members at bedside. Family supportive and  strongly involved in care. Pt's family appreciated social work intervention.   Patient/Family's Understanding of and Emotional Response to Diagnosis, Current Treatment, and Prognosis:  Patient's family aware of medical interventions.   Emotional Assessment Appearance:  Appears stated age Attitude/Demeanor/Rapport:  Unable to Assess Affect (typically observed):  Unable to Assess Orientation:  Oriented to Self, Oriented to Place, Oriented to  Time, Oriented to Situation Alcohol / Substance use:  Not Applicable Psych involvement (Current and /or in the community):  No (Comment)  Discharge Needs  Concerns to be addressed:  No discharge needs identified Readmission within the last 30 days:  Yes Current discharge risk:  None Barriers to Discharge:  Continued Medical Work up   Glendon Axe A 02/16/2016, 12:10 PM

## 2016-02-16 NOTE — Progress Notes (Signed)
Upon assessment RN noted that there were two subcutaneous lines to the patient's lower abdomen. No documentation was noted anywhere in the chart. RN called [employer] and spoke with nurse. She confirmed they were subcutaneous lines for hydration and were only for temporary use and could be removed. Spoke with MD and obtained verbal order to removed the lines. Lines removed with no difficulty. Sites clean dry and intact.  Callie Fielding RN

## 2016-02-16 NOTE — Progress Notes (Signed)
[name]    Progress Note  [name]  [id number] DOB: 1940/08/21 DOA: 02/15/2016 PCP: [name], FNP     Brief Narrative:   [name] is an 75 y.o. male with history of metastatic prostate cancer, diabetes mellitus type 2, hypertension, anemia and chronic kidney disease was referred to the ER after patient's hemoglobin was found to be around 7.Patient is torn or melanotic FOBT was positive.  Assessment/Plan:   Acute GI bleeding: Last colonoscopy in 2009 showed diverticulosis, small polyp. Hemoccult was positive but he denies any melanotic stools Or bright red blood from rectum Likely not to require another colonoscopy, appreciate GIs assistance. Status post 2 units of packed red blood cells, hemoglobin today is 9.5. Keep patient nothing by mouth. Continue Protonix twice a day.  Essential hypertension: Resume antihypertensive medications. Blood pressure control.  PROSTATE CANCER, HX OF  Normocytic Anemia  Recently diagnosed chronic DVT: Awaiting GI recommendations to start anticoagulation. Continue to hold Lovenox improving GIs recommendation  Hypokalemia: Repeat potassium orally.  Leukocytosis and thrombocytosis: Unclear etiology, he also had thrombocytosis has remained afebrile question of leukocytosis and acidosis likely due to metastatic prostate cancer. No clear source of infection.   DVT prophylaxis: SCD Family Communication:none Disposition Plan/Barrier to D/C: Unable to determined. Code Status:     Code Status Orders        Start     Ordered   02/16/16 0054  Full code  Continuous     02/16/16 0054    Code Status History    Date Active Date Inactive Code Status Order ID Comments User Context   01/29/2016 12:40 AM 02/02/2016  8:06 PM Full Code [id number]  Lily Kocher, MD Inpatient   01/09/2016 12:01 AM 01/11/2016  6:36 PM Full Code [id number]  Karmen Bongo, MD Inpatient        IV Access:     Peripheral IV   Procedures and diagnostic studies:   No results found.   Medical Consultants:    None.  Anti-Infectives:   None  Subjective:    [name]   Objective:    Vitals:   02/16/16 0134 02/16/16 0200 02/16/16 0353 02/16/16 0515  BP:  120/91 (!) 115/49 109/71  Pulse:  75 76 78  Resp:  20 16 14   Temp: 98.7 F (37.1 C)  98 F (36.7 C) 98 F (36.7 C)  TempSrc: Oral  Oral Oral  SpO2:  98% 100% 98%  Weight:   67.6 kg (149 lb)   Height:   5' 8.5" (1.74 m)     Intake/Output Summary (Last 24 hours) at 02/16/16 0838 Last data filed at 02/16/16 0600  Gross per 24 hour  Intake             2170 ml  Output                0 ml  Net             2170 ml   Filed Weights   02/16/16 0353  Weight: 67.6 kg (149 lb)    Exam: General exam: In no acute distress. Respiratory system: Good air movement and clear to auscultation. Cardiovascular system: S1 & S2 heard, RRR.  Gastrointestinal system: Abdomen is nondistended, soft and nontender.  Central nervous system: Alert and oriented. No focal neurological deficits. Extremities: No pedal edema. Skin: No rashes, lesions or ulcers Psychiatry: Judgement and insight appear normal. Mood & affect appropriate.    Data Reviewed:  Labs: Basic Metabolic Panel:  Recent Labs Lab 02/15/16 2054 02/16/16 0529  NA 140 144  K 2.7* 3.2*  CL 104 110  CO2 26 26  GLUCOSE 233* 191*  BUN 84* 77*  CREATININE 3.65* 3.27*  CALCIUM 7.5* 7.4*   GFR Estimated Creatinine Clearance: 19 mL/min (by C-G formula based on SCr of 3.27 mg/dL (H)). Liver Function Tests: No results for input(s): AST, ALT, ALKPHOS, BILITOT, PROT, ALBUMIN in the last 168 hours. No results for input(s): LIPASE, AMYLASE in the last 168 hours. No results for input(s): AMMONIA in the last 168 hours. Coagulation profile  Recent Labs Lab 02/15/16 2011  INR 1.03    CBC:  Recent Labs Lab 02/15/16 2011 02/16/16 0529  WBC 14.9* 15.5*   NEUTROABS 12.8*  --   HGB 7.1* 9.5*  HCT 21.3* 28.2*  MCV 84.5 84.7  PLT 535* 458*   Cardiac Enzymes: No results for input(s): CKTOTAL, CKMB, CKMBINDEX, TROPONINI in the last 168 hours. BNP (last 3 results) No results for input(s): PROBNP in the last 8760 hours. CBG:  Recent Labs Lab 02/16/16 0735  GLUCAP 148*   D-Dimer: No results for input(s): DDIMER in the last 72 hours. Hgb A1c: No results for input(s): HGBA1C in the last 72 hours. Lipid Profile: No results for input(s): CHOL, HDL, LDLCALC, TRIG, CHOLHDL, LDLDIRECT in the last 72 hours. Thyroid function studies: No results for input(s): TSH, T4TOTAL, T3FREE, THYROIDAB in the last 72 hours.  Invalid input(s): FREET3 Anemia work up: No results for input(s): VITAMINB12, FOLATE, FERRITIN, TIBC, IRON, RETICCTPCT in the last 72 hours. Sepsis Labs:  Recent Labs Lab 02/15/16 2011 02/16/16 0529  WBC 14.9* 15.5*   Microbiology Recent Results (from the past 240 hour(s))  MRSA PCR Screening     Status: None   Collection Time: 02/16/16  4:54 AM  Result Value Ref Range Status   MRSA by PCR NEGATIVE NEGATIVE Final    Comment:        The GeneXpert MRSA Assay (FDA approved for NASAL specimens only), is one component of a comprehensive MRSA colonization surveillance program. It is not intended to diagnose MRSA infection nor to guide or monitor treatment for MRSA infections.      Medications:   . atenolol  25 mg Oral Daily  . atorvastatin  10 mg Oral QPC supper  . ferrous sulfate  325 mg Oral Q breakfast  . insulin aspart  0-9 Units Subcutaneous TID WC  . [START ON 02/19/2016] pantoprazole  40 mg Intravenous Q12H   Continuous Infusions:  Time spent: 25 min   LOS: 0 days   [name]  [facility] Pager [phone]  *Please refer to [email or web address], password [email or web address] to get updated schedule on who will round on this patient, as hospitalists switch teams weekly. If 7PM-7AM, please contact night-coverage  at [email or web address], password [email or web address] for any overnight needs.  02/16/2016, 8:38 AM

## 2016-02-16 NOTE — NC FL2 (Signed)
[place] LEVEL OF CARE SCREENING TOOL     IDENTIFICATION  Patient Name: [name] Birthdate: 03-18-1941 Sex: male Admission Date (Current Location): 02/15/2016  [facility] and [place] Number:  Herbalist and Address:  [facility],  [address] [place]      Provider Number: [id number]  Attending Physician Name and Address:  [name], MD  Relative Name and Phone Number:       Current Level of Care: Hospital Recommended Level of Care: [place] Prior Approval Number:    Date Approved/Denied:   PASRR Number:  ([id number])  Discharge Plan: SNF    Current Diagnoses: Patient Active Problem List   Diagnosis Date Noted  . Acute GI bleeding 02/16/2016  . Acute blood loss anemia 02/16/2016  . Anemia 02/15/2016  . Weakness 01/29/2016  . Right leg weakness 01/29/2016  . HCAP (healthcare-associated pneumonia) 01/28/2016  . Dehydration   . Hypomagnesemia   . Protein-calorie malnutrition, severe 01/09/2016  . Hyperglycemia 01/08/2016  . Hypokalemia 01/08/2016  . Acute kidney injury superimposed on chronic kidney disease ([place]) 01/08/2016  . Prolonged Q-T interval on ECG 01/08/2016  . 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 Bilateral lower extremity edema 09/07/2013  . Type II or unspecified type diabetes mellitus without mention of complication, uncontrolled 05/20/2013  . Pulmonary embolus ([place]) 09/01/2010  . Diabetes mellitus type 2 in nonobese ([place]) 09/15/2006  . Hyperlipidemia associated with type 2 diabetes mellitus ([place]) 09/13/2006  . Essential hypertension 09/13/2006  . Disorder resulting from impaired renal function 09/13/2006  . PROSTATE CANCER, HX OF 09/13/2006    Orientation RESPIRATION BLADDER Height & Weight     Self, Time, Situation, Place  Normal Incontinent, External catheter Weight: 149 lb (67.6 kg) Height:  5' 8.5" (174 cm)  BEHAVIORAL SYMPTOMS/MOOD NEUROLOGICAL BOWEL  NUTRITION STATUS   (none )  (none ) Continent Diet (carb modified )  AMBULATORY STATUS COMMUNICATION OF NEEDS Skin   Extensive Assist Verbally  (Pressure injury )                       Personal Care Assistance Level of Assistance  Bathing, Dressing, Feeding Bathing Assistance: Limited assistance Feeding assistance: Limited assistance Dressing Assistance: Limited assistance     Functional Limitations Info  Speech, Hearing, Sight Sight Info: Adequate Hearing Info: Adequate Speech Info: Adequate    SPECIAL CARE FACTORS FREQUENCY  PT (By licensed PT)     PT Frequency: 3              Contractures      Additional Factors Info  Code Status, Allergies Code Status Info: Full code  Allergies Info: N/A           Current Medications (02/16/2016):  This is the current hospital active medication list Current Facility-Administered Medications  Medication Dose Route Frequency Provider Last Rate Last Dose  . acetaminophen (TYLENOL) tablet 650 mg  650 mg Oral Q6H PRN Rise Patience, MD       Or  . acetaminophen (TYLENOL) suppository 650 mg  650 mg Rectal Q6H PRN Rise Patience, MD      . atenolol (TENORMIN) tablet 25 mg  25 mg Oral Daily Rise Patience, MD   25 mg at 02/16/16 0831  . atorvastatin (LIPITOR) tablet 10 mg  10 mg Oral QPC supper Rise Patience, MD      . ferrous sulfate tablet 325 mg  325 mg  Oral Q breakfast Rise Patience, MD   325 mg at 02/16/16 0831  . furosemide (LASIX) tablet 40 mg  40 mg Oral Daily [name], MD      . insulin aspart (novoLOG) injection 0-9 Units  0-9 Units Subcutaneous TID WC Rise Patience, MD   1 Units at 02/16/16 [phone]  . potassium chloride SA (K-DUR,KLOR-CON) CR tablet 40 mEq  40 mEq Oral BID [name], MD         Discharge Medications: Please see discharge summary for a list of discharge medications.  Relevant Imaging Results:  Relevant Lab Results:   Additional  Information SSN [id number]  [name]

## 2016-02-17 ENCOUNTER — Inpatient Hospital Stay (HOSPITAL_COMMUNITY): Payer: Medicare Other | Admitting: Anesthesiology

## 2016-02-17 ENCOUNTER — Encounter (HOSPITAL_COMMUNITY)
Admission: EM | Disposition: A | Discharge status: Skilled Nursing Facility | Payer: Self-pay | Source: Home / Self Care | Attending: Family Medicine

## 2016-02-17 ENCOUNTER — Encounter (HOSPITAL_COMMUNITY): Payer: Self-pay

## 2016-02-17 DIAGNOSIS — K209 Esophagitis, unspecified without bleeding: Secondary | ICD-10-CM

## 2016-02-17 DIAGNOSIS — T18128A Food in esophagus causing other injury, initial encounter: Secondary | ICD-10-CM

## 2016-02-17 DIAGNOSIS — K220 Achalasia of cardia: Secondary | ICD-10-CM

## 2016-02-17 HISTORY — PX: ESOPHAGOGASTRODUODENOSCOPY: SHX5428

## 2016-02-17 LAB — BASIC METABOLIC PANEL
ANION GAP: 6 (ref 5–15)
BUN: 78 mg/dL — ABNORMAL HIGH (ref 6–20)
CO2: 25 mmol/L (ref 22–32)
Calcium: 7.6 mg/dL — ABNORMAL LOW (ref 8.9–10.3)
Chloride: 113 mmol/L — ABNORMAL HIGH (ref 101–111)
Creatinine, Ser: 3.24 mg/dL — ABNORMAL HIGH (ref 0.61–1.24)
GFR calc Af Amer: 20 mL/min — ABNORMAL LOW (ref >60)
GFR, EST NON AFRICAN AMERICAN: 17 mL/min — AB (ref >60)
GLUCOSE: 142 mg/dL — AB (ref 65–99)
POTASSIUM: 3 mmol/L — AB (ref 3.5–5.1)
Sodium: 144 mmol/L (ref 135–145)

## 2016-02-17 LAB — GLUCOSE, CAPILLARY
GLUCOSE-CAPILLARY: 128 mg/dL — AB (ref 65–99)
Glucose-Capillary: 140 mg/dL — ABNORMAL HIGH (ref 65–99)
Glucose-Capillary: 136 mg/dL — ABNORMAL HIGH (ref 65–99)
Glucose-Capillary: 116 mg/dL — ABNORMAL HIGH (ref 65–99)

## 2016-02-17 LAB — CBC
HEMATOCRIT: 29.4 % — AB (ref 39.0–52.0)
Hemoglobin: 9.9 g/dL — ABNORMAL LOW (ref 13.0–17.0)
MCH: 28.8 pg (ref 26.0–34.0)
MCHC: 33.7 g/dL (ref 30.0–36.0)
MCV: 85.5 fL (ref 78.0–100.0)
Platelets: 448 10*3/uL — ABNORMAL HIGH (ref 150–400)
RBC: 3.44 MIL/uL — AB (ref 4.22–5.81)
RDW: 15.2 % (ref 11.5–15.5)
WBC: 13.9 10*3/uL — AB (ref 4.0–10.5)

## 2016-02-17 SURGERY — EGD (ESOPHAGOGASTRODUODENOSCOPY)
Anesthesia: General

## 2016-02-17 SURGERY — CANCELLED PROCEDURE

## 2016-02-17 MED ORDER — FENTANYL CITRATE (PF) 100 MCG/2ML IJ SOLN
INTRAMUSCULAR | Status: DC | PRN
  Administered 2016-02-17 (×3): 25 ug via INTRAVENOUS

## 2016-02-17 MED ORDER — PANTOPRAZOLE SODIUM 40 MG IV SOLR: 40.0000 mg | Freq: Two times a day (BID) | INTRAVENOUS | Status: DC

## 2016-02-17 MED ORDER — FENTANYL CITRATE (PF) 100 MCG/2ML IJ SOLN
INTRAMUSCULAR | Status: AC
  Filled 2016-02-17: qty 2

## 2016-02-17 MED ORDER — PANTOPRAZOLE SODIUM 40 MG PO TBEC: 40.0000 mg | DELAYED_RELEASE_TABLET | Freq: Two times a day (BID) | ORAL | Status: DC

## 2016-02-17 MED ORDER — MIDAZOLAM HCL 5 MG/ML IJ SOLN
INTRAMUSCULAR | Status: AC
  Filled 2016-02-17: qty 2

## 2016-02-17 MED ORDER — BUTAMBEN-TETRACAINE-BENZOCAINE 2-2-14 % EX AERO
INHALATION_SPRAY | CUTANEOUS | Status: DC | PRN
  Administered 2016-02-17: 1 via TOPICAL

## 2016-02-17 MED ORDER — PHENYLEPHRINE 40 MCG/ML (10ML) SYRINGE FOR IV PUSH (FOR BLOOD PRESSURE SUPPORT)
PREFILLED_SYRINGE | INTRAVENOUS | Status: AC
Start: 2016-02-17 — End: 2016-02-17
  Filled 2016-02-17: qty 10

## 2016-02-17 MED ORDER — ENSURE ENLIVE PO LIQD
237.0000 mL | Freq: Two times a day (BID) | ORAL | Status: DC
  Administered 2016-02-17 – 2016-02-27 (×4): 237 mL via ORAL

## 2016-02-17 MED ORDER — EPHEDRINE SULFATE-NACL 50-0.9 MG/10ML-% IV SOSY
PREFILLED_SYRINGE | INTRAVENOUS | Status: DC | PRN
  Administered 2016-02-17 (×3): 10 mg via INTRAVENOUS

## 2016-02-17 MED ORDER — SUCCINYLCHOLINE CHLORIDE 20 MG/ML IJ SOLN
INTRAMUSCULAR | Status: AC
  Filled 2016-02-17: qty 1

## 2016-02-17 MED ORDER — MIDAZOLAM HCL 10 MG/2ML IJ SOLN
INTRAMUSCULAR | Status: DC | PRN
Start: 2016-02-17 — End: 2016-02-17
  Administered 2016-02-17 (×2): 1 mg via INTRAVENOUS

## 2016-02-17 MED ORDER — PHENYLEPHRINE 40 MCG/ML (10ML) SYRINGE FOR IV PUSH (FOR BLOOD PRESSURE SUPPORT)
PREFILLED_SYRINGE | INTRAVENOUS | Status: DC | PRN
  Administered 2016-02-17 (×4): 80 ug via INTRAVENOUS

## 2016-02-17 MED ORDER — ETOMIDATE 2 MG/ML IV SOLN
INTRAVENOUS | Status: DC | PRN
  Administered 2016-02-17: 14 mg via INTRAVENOUS

## 2016-02-17 MED ORDER — SODIUM CHLORIDE 0.9 % IV SOLN
INTRAVENOUS | Status: DC | PRN
  Administered 2016-02-17: 10:00:00 via INTRAVENOUS

## 2016-02-17 MED ORDER — SUCCINYLCHOLINE CHLORIDE 200 MG/10ML IV SOSY
PREFILLED_SYRINGE | INTRAVENOUS | Status: DC | PRN
  Administered 2016-02-17: 100 mg via INTRAVENOUS

## 2016-02-17 MED ORDER — ONABOTULINUMTOXINA 100 UNITS IJ SOLR
INTRAMUSCULAR | Status: AC
  Filled 2016-02-17: qty 100

## 2016-02-17 MED ORDER — POTASSIUM CHLORIDE CRYS ER 20 MEQ PO TBCR
40.0000 meq | EXTENDED_RELEASE_TABLET | Freq: Two times a day (BID) | ORAL | Status: AC
  Administered 2016-02-17 (×2): 40 meq via ORAL
  Filled 2016-02-17 (×2): qty 2

## 2016-02-17 MED ORDER — ETOMIDATE 2 MG/ML IV SOLN
INTRAVENOUS | Status: AC
  Filled 2016-02-17: qty 10

## 2016-02-17 MED ORDER — PANTOPRAZOLE SODIUM 40 MG IV SOLR
40.0000 mg | Freq: Two times a day (BID) | INTRAVENOUS | Status: DC
  Administered 2016-02-17 – 2016-02-18 (×4): 40 mg via INTRAVENOUS
  Filled 2016-02-17 (×4): qty 40

## 2016-02-17 MED ORDER — EPHEDRINE 5 MG/ML INJ
INTRAVENOUS | Status: AC
  Filled 2016-02-17: qty 10

## 2016-02-17 MED ORDER — SODIUM CHLORIDE 0.9 % IJ SOLN
INTRAMUSCULAR | Status: AC
  Filled 2016-02-17: qty 10

## 2016-02-17 NOTE — Progress Notes (Signed)
Patient with food impaction.Will proceed under  General anesthesia.

## 2016-02-17 NOTE — Anesthesia Procedure Notes (Signed)
Procedure Name: Intubation Date/Time: 02/17/2016 10:29 AM Performed by: Lajuana Carry E Pre-anesthesia Checklist: Patient identified, Emergency Drugs available, Suction available and Patient being monitored Patient Re-evaluated:Patient Re-evaluated prior to inductionOxygen Delivery Method: Circle system utilized Preoxygenation: Pre-oxygenation with 100% oxygen Intubation Type: IV induction, Cricoid Pressure applied and Rapid sequence Laryngoscope Size: Mac and 3 Grade View: Grade I Tube type: Oral Tube size: 7.5 mm Number of attempts: 1 Airway Equipment and Method: Stylet Placement Confirmation: ETT inserted through vocal cords under direct vision,  positive ETCO2 and breath sounds checked- equal and bilateral Secured at: 21 cm Tube secured with: Tape Dental Injury: Teeth and Oropharynx as per pre-operative assessment

## 2016-02-17 NOTE — Interval H&P Note (Signed)
History and Physical Interval Note:  02/17/2016 9:03 AM  [name]  has presented today for surgery, with the diagnosis of anemia.  FOBT +  The various methods of treatment have been discussed with the patient and family. After consideration of risks, benefits and other options for treatment, the patient has consented to  Procedure(s): ESOPHAGOGASTRODUODENOSCOPY (EGD) (N/A) as a surgical intervention .  The patient's history has been reviewed, patient examined, no change in status, stable for surgery.  I have reviewed the patient's chart and labs.  Questions were answered to the patient's satisfaction.     [name]

## 2016-02-17 NOTE — Progress Notes (Signed)
[name]    Progress Note  [name]  [id number] DOB: 1940-11-01 DOA: 02/15/2016 PCP: [name], FNP     Brief Narrative:   [name] is an 75 y.o. male with history of metastatic prostate cancer, diabetes mellitus type 2, hypertension, anemia and chronic kidney disease was referred to the ER after patient's hemoglobin was found to be around 7.Patient is torn or melanotic FOBT was positive.  Assessment/Plan:   Acute GI bleeding: Last colonoscopy in 2009 showed diverticulosis, small polyp. Status post 2 units of packed red blood cells, hemoglobin today is 9.5. EGD showed mild esophagitis continue Protonix twice a day. Follow-up with GI as an outpatient for biopsy results. Hopefully in the morning. GI to recommended to start anticoagulation. Start a full liquid diet and advanced tolerated.  Essential hypertension: Resume antihypertensive medications. Blood pressure control.  PROSTATE CANCER, HX OF  Normocytic Anemia  Recently diagnosed chronic DVT: Awaiting GI recommendations to start anticoagulation. Continue to hold Lovenox improving GIs recommendation  Hypokalemia: Repeat potassium orally.  Leukocytosis and thrombocytosis: Unclear etiology, he also had thrombocytosis has remained afebrile question of leukocytosis and acidosis likely due to metastatic prostate cancer. No clear source of infection, he continues to remain afebrile.   DVT prophylaxis: SCD Family Communication:none Disposition Plan/Barrier to D/C: Hopefully in the morning. Code Status:     Code Status Orders        Start     Ordered   02/16/16 0054  Full code  Continuous     02/16/16 0054    Code Status History    Date Active Date Inactive Code Status Order ID Comments User Context   01/29/2016 12:40 AM 02/02/2016  8:06 PM Full Code [id number]  Lily Kocher, MD Inpatient   01/09/2016 12:01 AM 01/11/2016  6:36 PM Full Code [id number]  Karmen Bongo, MD  Inpatient        IV Access:    Peripheral IV   Procedures and diagnostic studies:   No results found.   Medical Consultants:    None.  Anti-Infectives:   None  Subjective:    [name] he relates he feels great has no new complaints.  Objective:    Vitals:   02/17/16 1020 02/17/16 1151 02/17/16 1200 02/17/16 1230  BP:  (!) 105/54  137/69  Pulse: 72 71    Resp: 14 (!) 22    Temp:  97.5 F (36.4 C)    TempSrc:      SpO2: 100% 98% 97% 99%  Weight:      Height:        Intake/Output Summary (Last 24 hours) at 02/17/16 1311 Last data filed at 02/17/16 1150  Gross per 24 hour  Intake              600 ml  Output             1000 ml  Net             -400 ml   Filed Weights   02/16/16 0353  Weight: 67.6 kg (149 lb)    Exam: General exam: In no acute distress. Respiratory system: Good air movement and clear to auscultation. Cardiovascular system: S1 & S2 heard, RRR.  Gastrointestinal system: Abdomen is nondistended, soft and nontender.  Extremities: No pedal edema. Skin: No rashes, lesions or ulcers Psychiatry: Judgement and insight appear normal. Mood & affect appropriate.    Data Reviewed:    Labs: Basic Metabolic Panel:  Recent Labs Lab 02/15/16 2054 02/16/16 0529 02/17/16 0340  NA 140 144 144  K 2.7* 3.2* 3.0*  CL 104 110 113*  CO2 26 26 25   GLUCOSE 233* 191* 142*  BUN 84* 77* 78*  CREATININE 3.65* 3.27* 3.24*  CALCIUM 7.5* 7.4* 7.6*   GFR Estimated Creatinine Clearance: 19.1 mL/min (by C-G formula based on SCr of 3.24 mg/dL (H)). Liver Function Tests: No results for input(s): AST, ALT, ALKPHOS, BILITOT, PROT, ALBUMIN in the last 168 hours. No results for input(s): LIPASE, AMYLASE in the last 168 hours. No results for input(s): AMMONIA in the last 168 hours. Coagulation profile  Recent Labs Lab 02/15/16 2011  INR 1.03    CBC:  Recent Labs Lab 02/15/16 2011 02/16/16 0529 02/17/16 0340  WBC 14.9* 15.5* 13.9*    NEUTROABS 12.8*  --   --   HGB 7.1* 9.5* 9.9*  HCT 21.3* 28.2* 29.4*  MCV 84.5 84.7 85.5  PLT 535* 458* 448*   Cardiac Enzymes: No results for input(s): CKTOTAL, CKMB, CKMBINDEX, TROPONINI in the last 168 hours. BNP (last 3 results) No results for input(s): PROBNP in the last 8760 hours. CBG:  Recent Labs Lab 02/16/16 0735 02/16/16 1150 02/16/16 1707 02/16/16 2140 02/17/16 0744  GLUCAP 148* 137* 132* 132* 140*   D-Dimer: No results for input(s): DDIMER in the last 72 hours. Hgb A1c: No results for input(s): HGBA1C in the last 72 hours. Lipid Profile: No results for input(s): CHOL, HDL, LDLCALC, TRIG, CHOLHDL, LDLDIRECT in the last 72 hours. Thyroid function studies: No results for input(s): TSH, T4TOTAL, T3FREE, THYROIDAB in the last 72 hours.  Invalid input(s): FREET3 Anemia work up: No results for input(s): VITAMINB12, FOLATE, FERRITIN, TIBC, IRON, RETICCTPCT in the last 72 hours. Sepsis Labs:  Recent Labs Lab 02/15/16 2011 02/16/16 0529 02/17/16 0340  WBC 14.9* 15.5* 13.9*   Microbiology Recent Results (from the past 240 hour(s))  MRSA PCR Screening     Status: None   Collection Time: 02/16/16  4:54 AM  Result Value Ref Range Status   MRSA by PCR NEGATIVE NEGATIVE Final    Comment:        The GeneXpert MRSA Assay (FDA approved for NASAL specimens only), is one component of a comprehensive MRSA colonization surveillance program. It is not intended to diagnose MRSA infection nor to guide or monitor treatment for MRSA infections.      Medications:   . atenolol  25 mg Oral Daily  . atorvastatin  10 mg Oral QPC supper  . ferrous sulfate  325 mg Oral Q breakfast  . furosemide  40 mg Oral Daily  . insulin aspart  0-9 Units Subcutaneous TID WC  . pantoprazole  40 mg Oral Q0600  . pantoprazole (PROTONIX) IV  40 mg Intravenous Q12H   Continuous Infusions:  Time spent: 15 min   LOS: 1 day   [name]  [facility] Pager  [phone]  *Please refer to [email or web address], password [email or web address] to get updated schedule on who will round on this patient, as hospitalists switch teams weekly. If 7PM-7AM, please contact night-coverage at [email or web address], password [email or web address] for any overnight needs.  02/17/2016, 1:11 PM

## 2016-02-17 NOTE — Op Note (Signed)
[facility] Patient Name: [name] Procedure Date: 02/17/2016 MRN: [id number] Attending MD: [name] , MD Date of Birth: October 27, 1940 CSN: [id number] Age: 75 Admit Type: Inpatient Procedure:                Upper GI endoscopy Indications:              Anemia, FOBT + stool Providers:                [name], MD, Cleda Daub, RN, [facility] Tech, Technician, Lajuana Carry, CRNA Referring MD:              Medicines:                General Anesthesia Complications:            No immediate complications. Estimated blood loss:                            None. Estimated Blood Loss:     Estimated blood loss: none. Procedure:                Pre-Anesthesia Assessment:                           - Prior to the procedure, a History and Physical                            was performed, and patient medications and                            allergies were reviewed. The patient's tolerance of                            previous anesthesia was also reviewed. The risks                            and benefits of the procedure and the sedation                            options and risks were discussed with the patient.                            All questions were answered, and informed consent                            was obtained. Prior Anticoagulants: The patient has                            taken no previous anticoagulant or antiplatelet                            agents. ASA Grade Assessment: III - A patient with  severe systemic disease. After reviewing the risks                            and benefits, the patient was deemed in                            satisfactory condition to undergo the procedure.                           After obtaining informed consent, the endoscope was                            passed under direct vision. Throughout the                            procedure, the patient's  blood pressure, pulse, and                            oxygen saturations were monitored continuously. The                            [id number] scope was introduced through the                            mouth, and advanced to the second part of duodenum.                            The [id number]) scope was introduced through                            the and advanced to the. The upper GI endoscopy was                            accomplished without difficulty. The patient                            tolerated the procedure well. Scope In: Scope Out: Findings:      There was solid and liquid food filling his esophagus. This was an       unexpected finding. It was clear that I would not be able to safely       clear his esophagus without airway protection and I explained the       situation to his son in person, his wife on phone. They explained to me       that he was told he had achalasia 30 years ago and declined surgery.       Since then, for 20-30 years he has trouble with swallowing and will       intermittently regurgitate, vomit food. Looking back at records a CT       2017 shows very dilated, fluid filled esophagus which was, in retropect,       misread as being a large hiatal hernia. The family and I agreed it was       best to clear his esophagus and then consider Botox injection (with  general anesthessia). I asked anesthesia for help and they were very       helpful. We proceed with the case after general anesthesia initiated,       airway intubated.      The esophagus was very dilated and filled with solid food pieces. After       about 75 minutes of Roth Net retrieval, extensive lavage and suctioning       I was able to completely clear the esophagus. The mucosa throughout the       esopahgus was cobblestoned and the mucosa at the GE junction was       narrowed, ulcerated and appeared somewhat neoplastic. I elected to       biopsy the GE junction rather  the inject Botox at that point. The       mucosal biopsies, with forceps suggested firm mucosa.      Evaluation of the stomach and duodenum were essentially normal. Impression:               - Very dilated, food filled esophagus. This was                            cleared with Jabier Mutton Net, lavage, suctioning after                            airway intubation/general anesthesia. He was told                            he had achalasia in the very distant past and I                            believe that is probably true after speaking with                            his family reviewing recent CT(PET) images                            personally. The GE junction mucosa was very                            abnormal, the lumen narrowed. This may simply be                            severe inflammation, acid irritation but I have                            concern for neoplasia and so biopsied the GE                            junction.                           - Await final pathology results. If no                            neoplasia/cancer then he will need Botox injection.                           -  He should not advance diet past full liquids                            until then and should remain in the hospital to                            expedite his care.                           - IV PPI twice daily for now.                           - Of note, these findings MAY help explain his FOBT                            + anemia. Moderate Sedation:      N/A- Per Anesthesia Care Recommendation:           - Return patient to hospital ward for ongoing care.                           - Full liquid diet. Procedure Code(s):        --- Professional ---                           [phone], Esophagogastroduodenoscopy, flexible,                            transoral; with removal of foreign body(s)                           [id number], Esophagogastroduodenoscopy, flexible,                             transoral; with biopsy, single or multiple Diagnosis Code(s):        --- Professional ---                           [id number], Food in esophagus causing other injury,                            initial encounter                           D50.9, Iron deficiency anemia, unspecified CPT copyright 2016 [employer]. All rights reserved. The codes documented in this report are preliminary and upon coder review may  be revised to meet current compliance requirements. [name], MD 02/17/2016 11:59:36 AM This report has been signed electronically. Number of Addenda: 0

## 2016-02-17 NOTE — Anesthesia Postprocedure Evaluation (Signed)
Anesthesia Post Note  Patient: [name]  Procedure(s) Performed: Procedure(s) (LRB): ESOPHAGOGASTRODUODENOSCOPY (EGD) (N/A)  Patient location during evaluation: Endoscopy Anesthesia Type: General Level of consciousness: sedated and patient cooperative Pain management: pain level controlled Vital Signs Assessment: post-procedure vital signs reviewed and stable Respiratory status: spontaneous breathing, nonlabored ventilation, respiratory function stable and patient connected to nasal cannula oxygen Cardiovascular status: blood pressure returned to baseline and stable Postop Assessment: no signs of nausea or vomiting Anesthetic complications: no    Last Vitals:  Vitals:   02/17/16 1230 02/17/16 1315  BP: 137/69 123/65  Pulse:  70  Resp:    Temp:      Last Pain:  Vitals:   02/17/16 1315  TempSrc:   PainSc: 0-No pain                 Brittnye Josephs,E. Caylah Plouff

## 2016-02-17 NOTE — H&P (View-Only) (Signed)
[facility] Consult: 8:52 AM 02/16/2016  LOS: 0 days    Referring Provider: Dr Aileen Fass  Primary Care Physician:  [name], FNP Primary Gastroenterologist:  Dr. Scarlette Shorts, no visits since 2009    Reason for Consultation:  FOBT + anemia.    HPI: [name] is a 75 y.o. male.  PMH DM2 on oral agents.  HTN.  HLD.  Metastatic prostate cancer, original Dx 2006; treated with radiation seed implantation as well as androgen deprivation therapy. PSA 38 01/25/16, bone scan 11/2015 with recurrence in prostate.  Started Zitiga 12/2015 but the dose was reduced and later discontinued by Dr. Alen Blew because of weakness, anorexia, weight loss.  Large HH per chest x ray.  Colonoscopy 2009: scattered pan-diverticulosis and prolapse type small rectal polyp. Esophageal manometry 1984 sugg of achalasia.   10/21 - 02/02/16 admission from home with weakness, anorexia, unsteady gait.  Imaging with ? PNA, sclerotic lesion in C 3 (? Mets). Diagnosed with new RLE DVT.  Discharge to SNF on Lovenox and po iron.  Received PRBC x 1 for heme negative anemia, Hgb 6.8.  Hgb up to 8.2 at discharge.  At the SNF/rehabilitation facility patient has mostly been in bed. He requires full assist in order to stand or walk. Appetite has improved but is still diminished. Patient denies N/V.  No reports of melena or blood per rectum.  During the recent admission he suffered from some constipation but this is not an issue currently. No abdominal pain. Denies dysphagia, heartburn. No previous blood transfusions. He  Referred to ED by SNF for Hgb of 7.1.  Stool now FOBT + but no hx of melena, or BPR.   Hgb 9.5 after PRBC x 2.   CXR with large HH.  No PNA or CHF.   No PPI etc PTA.     Past Medical History:  Diagnosis Date  . Cataract   . CKD  (chronic kidney disease) 02/2016  . Diabetes mellitus, type 2 ([place]) 2008  . Hyperlipidemia 2008  . Hypertension 2008  . Prostate cancer ([place]) 2006    Past Surgical History:  Procedure Laterality Date  . COLONOSCOPY W/ POLYPECTOMY  03/2008   diminutive rectal polyp (path: prolapse type polyp, not adenoma).  moderate pan-diverticulitis.   Marland Kitchen ESOPHAGEAL MANOMETRY  1984   unable to pass manometry catheter beyond LES. Aperiystalsis of esophagus: sugg of achalasia.   Marland Kitchen PROSTATE SURGERY     radiation therapy only    Prior to Admission medications   Medication Sig Start Date End Date Taking? Authorizing Provider  acetaminophen (TYLENOL) 650 MG CR tablet Take 650 mg by mouth every 4 (four) hours as needed for pain.   Yes Historical Provider, MD  atenolol (TENORMIN) 25 MG tablet Take 25 mg by mouth daily.   Yes Historical Provider, MD  atorvastatin (LIPITOR) 10 MG tablet Take 1 tablet (10 mg total) by mouth daily after supper. 10/10/15  Yes Elayne Snare, MD  docusate sodium (COLACE) 100 MG capsule Take 1 capsule (100 mg total) by mouth 2 (two)  times daily. 02/02/16  Yes Donne Hazel, MD  enoxaparin (LOVENOX) 60 MG/0.6ML injection Inject 0.6 mLs (60 mg total) into the skin daily. 02/02/16  Yes Donne Hazel, MD  feeding supplement, GLUCERNA SHAKE, (GLUCERNA SHAKE) LIQD Take 237 mLs by mouth 2 (two) times daily between meals.   Yes Historical Provider, MD  ferrous sulfate 325 (65 FE) MG tablet Take 1 tablet (325 mg total) by mouth daily with breakfast. 02/02/16  Yes Donne Hazel, MD  furosemide (LASIX) 40 MG tablet Take 40 mg by mouth daily.  01/11/16  Yes Historical Provider, MD  glipiZIDE (GLUCOTROL XL) 2.5 MG 24 hr tablet Take 2.5 mg by mouth daily with breakfast.   Yes Historical Provider, MD  ONE TOUCH ULTRA TEST test strip USE TO TEST ONCE DAILY 08/29/15  Yes Elayne Snare, MD  [facility] DELICA LANCETS [id number] [place] USE TO TEST ONCE DAILY Dx code E11.65 01/04/16  Yes Elayne Snare, MD  pioglitazone (ACTOS)  30 MG tablet TAKE 1 TABLET BY MOUTH EVERY DAY Patient taking differently: TAKE 30mg  TABLET BY MOUTH EVERY DAY 01/02/16  Yes Elayne Snare, MD  polyethylene glycol (MIRALAX / GLYCOLAX) packet Take 17 g by mouth daily. 02/03/16  Yes Donne Hazel, MD  potassium chloride SA (KLOR-CON M20) 20 MEQ tablet Take 1 tablet (20 mEq total) by mouth daily. 09/01/15  Yes [name], FNP  TRADJENTA 5 MG TABS tablet TAKE 1 TABLET (5 MG TOTAL) BY MOUTH DAILY. 11/28/15  Yes Elayne Snare, MD  GLIPIZIDE XL 5 MG 24 hr tablet TAKE 1 TABLET BY MOUTH DAILY WITH BREAKFAST. Patient not taking: Reported on 02/15/2016 11/24/15   Elayne Snare, MD  levofloxacin (LEVAQUIN) 750 MG tablet Take 1 tablet (750 mg total) by mouth every other day. Patient not taking: Reported on 02/15/2016 02/03/16   Donne Hazel, MD  ZYTIGA 250 MG tablet TAKE 4 TABLETS (1,000MG ) BY MOUTH EVERY DAY ON AN EMPTY STOMACH 1 HOURBEFORE OR 2 HOURS AFTER A MEAL Patient not taking: Reported on 02/15/2016 01/10/16   Wyatt Portela, MD    Scheduled Meds: . atenolol  25 mg Oral Daily  . atorvastatin  10 mg Oral QPC supper  . ferrous sulfate  325 mg Oral Q breakfast  . insulin aspart  0-9 Units Subcutaneous TID WC  . [START ON 02/19/2016] pantoprazole  40 mg Intravenous Q12H   Infusions:  PRN Meds: acetaminophen **OR** acetaminophen   Allergies as of 02/15/2016  . (No Known Allergies)    Family History  Problem Relation Age of Onset  . Diabetes Father   . Diabetes Sister   . Hypertension Sister   . Hypertension Brother   . Heart disease Neg Hx     Social History   Social History  . Marital status: Married    Spouse name: N/A  . Number of children: N/A  . Years of education: N/A   Occupational History  . retired    Social History Main Topics  . Smoking status: Former Smoker    Years: 10.00    Quit date: 2000  . Smokeless tobacco: Never Used  . Alcohol use No  . Drug use: No  . Sexual activity: Not on file   Other Topics Concern  .  Not on file   Social History Narrative  . No narrative on file    REVIEW OF SYSTEMS: Constitutional:  Weakness. ENT:  No nose bleeds Pulm:  Denies cough or dyspnea. No orthopnea. CV:  No palpitations, no  LE edema. No chest pain GU:  No hematuria, no frequency GI:  Per HPI Heme:  No unusual bleeding or bruising. No previous history of anemia per his recall.   Transfusions:  Patient and his son recall no previous transfusions prior to 10 2017 Neuro:  No headaches, no peripheral tingling or numbness Derm:  No itching, no rash or sores.  Endocrine:  No sweats or chills.  No polyuria or dysuria Immunization: Vaccination record reviewed area he is up-to-date on his flu shot and multiple other indicated vaccinations. Travel:  None beyond local counties in last few months.    PHYSICAL EXAM: Vital signs in last 24 hours: Vitals:   02/16/16 0353 02/16/16 0515  BP: (!) 115/49 109/71  Pulse: 76 78  Resp: 16 14  Temp: 98 F (36.7 C) 98 F (36.7 C)   Wt Readings from Last 3 Encounters:  02/16/16 67.6 kg (149 lb)  01/29/16 65 kg (143 lb 4.8 oz)  01/25/16 64.4 kg (142 lb)    General: Chronically ill, cachectic-appearing elderly AAM. Currently comfortable. Head:  No facial asymmetry or swelling.  Eyes:  No scleral icterus or conjunctival pallor. Ears:  Not clear if he is hard of hearing or he is just slow to respond.  Nose:  No congestion or discharge. Mouth:  Mucous membranes dry. He has 3 intact teeth on the front lower jaw. Tongue is midline. Neck:  No mass. No JVD. No TMG. Lungs:  No labored breathing or cough. Lungs with diminished but clear breath sounds. Heart: RRR. No MRG. Abdomen:  Soft. Not tender. Not distended. Some fullness in the upper abdomen but no distinct hepatosplenomegaly or splenomegaly. Fine gauge to tubing present in the lower abdomen, suprapubic this. Not clear what this is after discussing with nurse and reviewing the medical record. Don't know if this is  subcutaneous tubing..   Rectal: Deferred rectal exam.   Musc/Skeltl: No joint erythema or swelling. Some kyphosis. Extremities:  Slight swelling in the feet bilaterally. No tenderness to the swelling. Both feet are warm and well perfused.  Neurologic/psych:  Patient is alert. His speech, perhaps due to lack of dentition, is muffled and hard to understand. He is oriented 3. He can move all of his limbs. No tremor present.  He is appropriate. He is calm. Skin:  No rashes, sores or suspicious lesions     Intake/Output from previous day: 11/08 0701 - 11/09 0700 In: 2170 [I.V.:1000; Blood:670; IV [id number] Out: -  Intake/Output this shift: No intake/output data recorded.  LAB RESULTS:  Recent Labs  02/15/16 2011 02/16/16 0529  WBC 14.9* 15.5*  HGB 7.1* 9.5*  HCT 21.3* 28.2*  PLT 535* 458*   BMET Lab Results  Component Value Date   NA 144 02/16/2016   NA 140 02/15/2016   NA 140 02/02/2016   K 3.2 (L) 02/16/2016   K 2.7 (LL) 02/15/2016   K 3.6 02/02/2016   CL 110 02/16/2016   CL 104 02/15/2016   CL 106 02/02/2016   CO2 26 02/16/2016   CO2 26 02/15/2016   CO2 25 02/02/2016   GLUCOSE 191 (H) 02/16/2016   GLUCOSE 233 (H) 02/15/2016   GLUCOSE 138 (H) 02/02/2016   BUN 77 (H) 02/16/2016   BUN 84 (H) 02/15/2016   BUN 47 (H) 02/02/2016   CREATININE 3.27 (H) 02/16/2016   CREATININE 3.65 (H) 02/15/2016   CREATININE 2.88 (H) 02/02/2016   CALCIUM 7.4 (L) 02/16/2016   CALCIUM 7.5 (L) 02/15/2016   CALCIUM 8.2 (  L) 02/02/2016   LFT No results for input(s): PROT, ALBUMIN, AST, ALT, ALKPHOS, BILITOT, BILIDIR, IBILI in the last 72 hours. PT/INR Lab Results  Component Value Date   INR 1.03 02/15/2016   INR 1.28 01/28/2016   INR 2.7 11/02/2011   Hepatitis Panel No results for input(s): HEPBSAG, HCVAB, HEPAIGM, HEPBIGM in the last 72 hours. C-Diff No components found for: CDIFF Lipase     Component Value Date/Time   LIPASE 30 01/08/2016 2145    Drugs of Abuse  No  results found for: LABOPIA, COCAINSCRNUR, LABBENZ, AMPHETMU, THCU, LABBARB   RADIOLOGY STUDIES: No results found.   IMPRESSION:   *  FOBT + anemia, no overt GI bleeding. Started 2 plus weeks ago on Lovenox for new LE DVT.  However, FOBT negative anemia predates the Lovenox. S/p PRBC x 1 in 01/2016 and x 2 overnight.  Large HH on chest imaging.  ? Achalasia on remote 1984 esophageal manometry.  Benign colon polyp and diverticulosis on 2009 screening colonoscopy.  *  LE DVT Dxd 2 to 3 weeks ago.   *   Metastatic prostate cancer. Prostate cancer recurrent on bone scan 11/2015. Previous radiation seed implantation and [facility]as androgen deprivation therapy, Zytiga 12/2015 - 01/2016.  *  CKD stage 4.  Certainly contributing to anemia.    PLAN:     *  EGD?  Will d/w Dr Ardis Hughs.  For now can resume diet.    *  Switch to oral PPI  *  ? Place IVC filter and stop Lovenox permanently?    Azucena Freed  02/16/2016, 8:52 AM Pager: [phone]  ________________________________________________________________________  Velora Heckler GI MD note:  I personally examined the patient, reviewed the data and agree with the assessment and plan described above.  He is pretty frail, has metastatic prostate cancer, mild dementia.  Long discussion with him and two sons.  Planning for EGD tomorrow. If this does not show clear etiology of his anemia, probably will not proceed with colonoscopy given frailty, cancer, etc.  He had PET scan recently and I would expect that to have pretty well ruled out advanced colon neoplasm since nothing was PET avid in the colon.     Owens Loffler, MD [facility] Pager [phone]

## 2016-02-17 NOTE — Transfer of Care (Signed)
Immediate Anesthesia Transfer of Care Note  Patient: [name]  Procedure(s) Performed: Procedure(s): ESOPHAGOGASTRODUODENOSCOPY (EGD) (N/A)  Patient Location: PACU  Anesthesia Type:General  Level of Consciousness:  sedated, patient cooperative and responds to stimulation  Airway & Oxygen Therapy:Patient Spontanous Breathing and Patient connected to face mask oxgen  Post-op Assessment:  Report given to PACU RN and Post -op Vital signs reviewed and stable  Post vital signs:  Reviewed and stable  Last Vitals:  Vitals:   02/17/16 1015 02/17/16 1020  BP: (!) 95/43   Pulse: 70 72  Resp: (!) 27 14  Temp:      Complications: No apparent anesthesia complications

## 2016-02-17 NOTE — OR Nursing (Signed)
Pt gave verbal consent for Son to sign consent for his endoscopic procedure this am.  Two RN's, Tory Emerald, RN and Laverta Baltimore, RN verified verbal consent for procedure. k  Laverta Baltimore, RN

## 2016-02-17 NOTE — Anesthesia Preprocedure Evaluation (Addendum)
Anesthesia Evaluation  Patient identified by MRN, date of birth, ID bandGeneral Assessment Comment:Pt sedated  History of Anesthesia Complications Negative for: history of anesthetic complications  Airway Mallampati: II  TM Distance: >3 FB Neck ROM: Full    Dental  (+) Poor Dentition, Missing, Chipped, Loose, Dental Advisory Given   Pulmonary former smoker (quit 2000),    breath sounds clear to auscultation       Cardiovascular hypertension, Pt. on medications and Pt. on home beta blockers (-) angina Rhythm:Regular Rate:Normal     Neuro/Psych    GI/Hepatic Neg liver ROS, Achalasia with food impaction   Endo/Other  diabetes, Insulin Dependent, Oral Hypoglycemic Agents  Renal/GU Renal InsufficiencyRenal disease (creat 3.24, K+ 3.0)   Prostate cancer    Musculoskeletal   Abdominal   Peds  Hematology Hb 9.9   Anesthesia Other Findings   Reproductive/Obstetrics                            Anesthesia Physical Anesthesia Plan  ASA: III and emergent  Anesthesia Plan: General   Post-op Pain Management:    Induction: Intravenous and Rapid sequence  Airway Management Planned: Oral ETT  Additional Equipment:   Intra-op Plan:   Post-operative Plan: Extubation in OR  Informed Consent: I have reviewed the patients History and Physical, chart, labs and discussed the procedure including the risks, benefits and alternatives for the proposed anesthesia with the patient or authorized representative who has indicated his/her understanding and acceptance.   Dental advisory given  Plan Discussed with: CRNA and Surgeon  Anesthesia Plan Comments: (Called by GI to manage pt's airway, already sedated, total food impaction in esophagus Plan routine monitors, GETA)       Anesthesia Quick Evaluation

## 2016-02-17 NOTE — Progress Notes (Signed)
Pt was resting when I arrived. His sons, granddaughter and a family friend were bedside. Had prayer w/family for which they were grateful. Please page if additional support is needed. Chaplain Ernest Haber, M.Div.   02/16/16 0300  Clinical Encounter Type  Visited With Patient and family together

## 2016-02-18 DIAGNOSIS — I10 Essential (primary) hypertension: Secondary | ICD-10-CM

## 2016-02-18 LAB — BASIC METABOLIC PANEL
Anion gap: 9 (ref 5–15)
BUN: 79 mg/dL — AB (ref 6–20)
CHLORIDE: 115 mmol/L — AB (ref 101–111)
CO2: 25 mmol/L (ref 22–32)
CREATININE: 2.94 mg/dL — AB (ref 0.61–1.24)
Calcium: 8.2 mg/dL — ABNORMAL LOW (ref 8.9–10.3)
GFR calc Af Amer: 23 mL/min — ABNORMAL LOW (ref >60)
GFR calc non Af Amer: 20 mL/min — ABNORMAL LOW (ref >60)
GLUCOSE: 129 mg/dL — AB (ref 65–99)
POTASSIUM: 3.2 mmol/L — AB (ref 3.5–5.1)
Sodium: 149 mmol/L — ABNORMAL HIGH (ref 135–145)

## 2016-02-18 LAB — GLUCOSE, CAPILLARY
GLUCOSE-CAPILLARY: 111 mg/dL — AB (ref 65–99)
GLUCOSE-CAPILLARY: 95 mg/dL (ref 65–99)
Glucose-Capillary: 128 mg/dL — ABNORMAL HIGH (ref 65–99)
Glucose-Capillary: 153 mg/dL — ABNORMAL HIGH (ref 65–99)

## 2016-02-18 MED ORDER — POTASSIUM CHLORIDE CRYS ER 20 MEQ PO TBCR
40.0000 meq | EXTENDED_RELEASE_TABLET | Freq: Two times a day (BID) | ORAL | Status: AC
  Administered 2016-02-18 (×2): 40 meq via ORAL
  Filled 2016-02-18 (×2): qty 2

## 2016-02-18 NOTE — Progress Notes (Signed)
[name] Progress Note    Since last GI note: EGD yesterday, see full note in EPIC.  His esophagus was FILLED with food. This was cleared and the GE junction was biopsied.  He is sitting up in bed, sleepy. RN says he doesn't have much appetite, but is tolerating what he is drinking.  Objective: Vital signs in last 24 hours: Temp:  [97.5 F (36.4 C)-98.4 F (36.9 C)] 98.4 F (36.9 C) (11/11 [id number]) Pulse Rate:  [70-79] 77 (11/11 0638) Resp:  [14-27] 18 (11/11 0638) BP: (82-137)/(33-69) 121/67 (11/11 0638) SpO2:  [93 %-100 %] 93 % (11/11 [id number]) Last BM Date: 02/15/16 General: alert and oriented times 2 Heart: regular rate and rythm Abdomen: soft, non-tender, non-distended, normal bowel sounds   Lab Results:  Recent Labs  02/15/16 2011 02/16/16 0529 02/17/16 0340  WBC 14.9* 15.5* 13.9*  HGB 7.1* 9.5* 9.9*  PLT 535* 458* 448*  MCV 84.5 84.7 85.5    Recent Labs  02/16/16 0529 02/17/16 0340 02/18/16 0354  NA 144 144 149*  K 3.2* 3.0* 3.2*  CL 110 113* 115*  CO2 26 25 25   GLUCOSE 191* 142* 129*  BUN 77* 78* 79*  CREATININE 3.27* 3.24* 2.94*  CALCIUM 7.4* 7.6* 8.2*    Recent Labs  02/15/16 2011  INR 1.03     Medications: Scheduled Meds: . atenolol  25 mg Oral Daily  . atorvastatin  10 mg Oral QPC supper  . feeding supplement (ENSURE ENLIVE)  237 mL Oral BID BM  . ferrous sulfate  325 mg Oral Q breakfast  . furosemide  40 mg Oral Daily  . insulin aspart  0-9 Units Subcutaneous TID WC  . pantoprazole (PROTONIX) IV  40 mg Intravenous Q12H  . potassium chloride  40 mEq Oral BID   Continuous Infusions: PRN Meds:.acetaminophen **OR** acetaminophen, HYDROcodone-acetaminophen    Assessment/Plan: 75 y.o. male achalasia, esophagus filled with food yesterday (cleared during EGD), abnormal GE junction mucosa  All in the setting of FTT, metastatic prostate cancer.  The EGD finding yesterday was not expected; indication for procedure was FOBT  anemia actually.    Clearly he has achalasia.  The GE junction mucosa was abnormal appearing, I have some concern for underlying malignancy although that will probably not be the case.  He should not advance diet past full liquids (need to encourage this diet, also twice daily nutrition drinks).  He needs to stay on IV PPI twice daily today and then change to PO twice daily tomorrow.  If biopsies from his GE junction prove that he does NOT have malignancy then we will proceed with Botox GE junction injection in hospital. I discussed the plan with his son again, he is heading back to [place] today.  [name] is a bit demented and I'm not so sure he understands the complexities that we are dealing with here.    [name], MD  02/18/2016, 8:43 AM [facility] Pager [phone]

## 2016-02-18 NOTE — Clinical Social Work Note (Signed)
Per MD, patient is not medically stable for discharge and will have procedure on Monday, 11/13.   Patient is from [facility] and plans to return at discharge.   MSW remains available as needed.   Glendon Axe, MSW [phone] 02/18/2016 10:31 AM

## 2016-02-18 NOTE — Progress Notes (Addendum)
[name]    Progress Note  [name]  [id number] DOB: 1940-08-10 DOA: 02/15/2016 PCP: [name], FNP     Brief Narrative:   [name] is an 75 y.o. male with history of metastatic prostate cancer, diabetes mellitus type 2, hypertension, anemia and chronic kidney disease was referred to the ER after patient's hemoglobin was found to be around 7.Patient is torn or melanotic FOBT was positive.  Assessment/Plan:   Acute GI bleeding Due to esophagitis: Last colonoscopy in 2009 showed diverticulosis, small polyp. Status post 2 units of packed red blood cells, hemoglobin today is 9.5. EGD showed mild esophagitis continue Protonix twice a day.  GI recommended to continue a full liquid diet and elevate biopsy results for possible malignancy. If negative will proceed with next injection on 02/20/2016. GI to recommended to start anticoagulation. Start a full liquid diet.  Achalasia: For possible Botox injection on 04/22/2015. The pending the results of biopsy results.  Essential hypertension: Resume antihypertensive medications. Blood pressure control.  PROSTATE CANCER, HX OF  Normocytic Anemia  Recently diagnosed chronic DVT: Awaiting GI recommendations to start anticoagulation. Continue to hold Lovenox.  Hypokalemia: Repeat potassium orally.  Leukocytosis and thrombocytosis: Unclear etiology, he also had thrombocytosis has remained afebrile with leukocytosis ? due to metastatic prostate cancer. No clear source of infection, he continues to remain afebrile.   DVT prophylaxis: SCD Family Communication:none Disposition Plan/Barrier to D/C: Unable to determined. Code Status:     Code Status Orders        Start     Ordered   02/16/16 0054  Full code  Continuous     02/16/16 0054    Code Status History    Date Active Date Inactive Code Status Order ID Comments User Context   01/29/2016 12:40 AM 02/02/2016  8:06 PM Full  Code [id number]  Lily Kocher, MD Inpatient   01/09/2016 12:01 AM 01/11/2016  6:36 PM Full Code [id number]  Karmen Bongo, MD Inpatient        IV Access:    Peripheral IV   Procedures and diagnostic studies:   No results found.   Medical Consultants:    None.  Anti-Infectives:   None  Subjective:    [name] he relates he feels great has no new complaints.  Objective:    Vitals:   02/17/16 1230 02/17/16 1315 02/17/16 2100 02/18/16 0638  BP: 137/69 123/65 124/68 121/67  Pulse:  70 78 77  Resp:   20 18  Temp:    98.4 F (36.9 C)  TempSrc:    Oral  SpO2: 99% 99% 99% 93%  Weight:      Height:        Intake/Output Summary (Last 24 hours) at 02/18/16 0941 Last data filed at 02/18/16 0930  Gross per 24 hour  Intake              720 ml  Output              500 ml  Net              220 ml   Filed Weights   02/16/16 0353  Weight: 67.6 kg (149 lb)    Exam: General exam: In no acute distress. Respiratory system: Good air movement and clear to auscultation. Cardiovascular system: S1 & S2 heard, RRR.  Gastrointestinal system: Abdomen is nondistended, soft and nontender.  Extremities: No pedal edema. Skin: No rashes, lesions or ulcers Psychiatry: Judgement and  insight appear normal. Mood & affect appropriate.    Data Reviewed:    Labs: Basic Metabolic Panel:  Recent Labs Lab 02/15/16 2054 02/16/16 0529 02/17/16 0340 02/18/16 0354  NA 140 144 144 149*  K 2.7* 3.2* 3.0* 3.2*  CL 104 110 113* 115*  CO2 26 26 25 25   GLUCOSE 233* 191* 142* 129*  BUN 84* 77* 78* 79*  CREATININE 3.65* 3.27* 3.24* 2.94*  CALCIUM 7.5* 7.4* 7.6* 8.2*   GFR Estimated Creatinine Clearance: 21.1 mL/min (by C-G formula based on SCr of 2.94 mg/dL (H)). Liver Function Tests: No results for input(s): AST, ALT, ALKPHOS, BILITOT, PROT, ALBUMIN in the last 168 hours. No results for input(s): LIPASE, AMYLASE in the last 168 hours. No results for input(s): AMMONIA in  the last 168 hours. Coagulation profile  Recent Labs Lab 02/15/16 2011  INR 1.03    CBC:  Recent Labs Lab 02/15/16 2011 02/16/16 0529 02/17/16 0340  WBC 14.9* 15.5* 13.9*  NEUTROABS 12.8*  --   --   HGB 7.1* 9.5* 9.9*  HCT 21.3* 28.2* 29.4*  MCV 84.5 84.7 85.5  PLT 535* 458* 448*   Cardiac Enzymes: No results for input(s): CKTOTAL, CKMB, CKMBINDEX, TROPONINI in the last 168 hours. BNP (last 3 results) No results for input(s): PROBNP in the last 8760 hours. CBG:  Recent Labs Lab 02/17/16 0744 02/17/16 1335 02/17/16 1724 02/17/16 2046 02/18/16 0759  GLUCAP 140* 128* 136* 116* 128*   D-Dimer: No results for input(s): DDIMER in the last 72 hours. Hgb A1c: No results for input(s): HGBA1C in the last 72 hours. Lipid Profile: No results for input(s): CHOL, HDL, LDLCALC, TRIG, CHOLHDL, LDLDIRECT in the last 72 hours. Thyroid function studies: No results for input(s): TSH, T4TOTAL, T3FREE, THYROIDAB in the last 72 hours.  Invalid input(s): FREET3 Anemia work up: No results for input(s): VITAMINB12, FOLATE, FERRITIN, TIBC, IRON, RETICCTPCT in the last 72 hours. Sepsis Labs:  Recent Labs Lab 02/15/16 2011 02/16/16 0529 02/17/16 0340  WBC 14.9* 15.5* 13.9*   Microbiology Recent Results (from the past 240 hour(s))  MRSA PCR Screening     Status: None   Collection Time: 02/16/16  4:54 AM  Result Value Ref Range Status   MRSA by PCR NEGATIVE NEGATIVE Final    Comment:        The GeneXpert MRSA Assay (FDA approved for NASAL specimens only), is one component of a comprehensive MRSA colonization surveillance program. It is not intended to diagnose MRSA infection nor to guide or monitor treatment for MRSA infections.      Medications:   . atenolol  25 mg Oral Daily  . atorvastatin  10 mg Oral QPC supper  . feeding supplement (ENSURE ENLIVE)  237 mL Oral BID BM  . ferrous sulfate  325 mg Oral Q breakfast  . furosemide  40 mg Oral Daily  . insulin  aspart  0-9 Units Subcutaneous TID WC  . pantoprazole (PROTONIX) IV  40 mg Intravenous Q12H  . potassium chloride  40 mEq Oral BID   Continuous Infusions:  Time spent: 15 min   LOS: 2 days   [name]  [facility] Pager [phone]  *Please refer to [email or web address], password [email or web address] to get updated schedule on who will round on this patient, as hospitalists switch teams weekly. If 7PM-7AM, please contact night-coverage at [email or web address], password [email or web address] for any overnight needs.  02/18/2016, 9:41 AM

## 2016-02-19 DIAGNOSIS — E876 Hypokalemia: Secondary | ICD-10-CM

## 2016-02-19 DIAGNOSIS — D649 Anemia, unspecified: Secondary | ICD-10-CM

## 2016-02-19 DIAGNOSIS — E870 Hyperosmolality and hypernatremia: Secondary | ICD-10-CM

## 2016-02-19 DIAGNOSIS — L8990 Pressure ulcer of unspecified site, unspecified stage: Secondary | ICD-10-CM | POA: Insufficient documentation

## 2016-02-19 LAB — BASIC METABOLIC PANEL
Anion gap: 12 (ref 5–15)
BUN: 76 mg/dL — ABNORMAL HIGH (ref 6–20)
CALCIUM: 8.4 mg/dL — AB (ref 8.9–10.3)
CO2: 23 mmol/L (ref 22–32)
CREATININE: 2.97 mg/dL — AB (ref 0.61–1.24)
Chloride: 116 mmol/L — ABNORMAL HIGH (ref 101–111)
GFR calc non Af Amer: 19 mL/min — ABNORMAL LOW (ref >60)
GFR, EST AFRICAN AMERICAN: 22 mL/min — AB (ref >60)
Glucose, Bld: 149 mg/dL — ABNORMAL HIGH (ref 65–99)
Potassium: 3.4 mmol/L — ABNORMAL LOW (ref 3.5–5.1)
Sodium: 151 mmol/L — ABNORMAL HIGH (ref 135–145)

## 2016-02-19 LAB — GLUCOSE, CAPILLARY
GLUCOSE-CAPILLARY: 164 mg/dL — AB (ref 65–99)
GLUCOSE-CAPILLARY: 140 mg/dL — AB (ref 65–99)
Glucose-Capillary: 125 mg/dL — ABNORMAL HIGH (ref 65–99)
Glucose-Capillary: 186 mg/dL — ABNORMAL HIGH (ref 65–99)
Glucose-Capillary: 165 mg/dL — ABNORMAL HIGH (ref 65–99)

## 2016-02-19 MED ORDER — INSULIN ASPART 100 UNIT/ML ~~LOC~~ SOLN
0.0000 [IU] | Freq: Three times a day (TID) | SUBCUTANEOUS | Status: DC
  Administered 2016-02-19: 2 [IU] via SUBCUTANEOUS
  Administered 2016-02-19: 1 [IU] via SUBCUTANEOUS
  Administered 2016-02-20: 7 [IU] via SUBCUTANEOUS
  Administered 2016-02-22 (×2): 3 [IU] via SUBCUTANEOUS

## 2016-02-19 MED ORDER — INSULIN ASPART 100 UNIT/ML ~~LOC~~ SOLN: 0.0000 [IU] | Freq: Every day | SUBCUTANEOUS | Status: DC

## 2016-02-19 MED ORDER — PANTOPRAZOLE SODIUM 40 MG PO TBEC
40.0000 mg | DELAYED_RELEASE_TABLET | Freq: Two times a day (BID) | ORAL | Status: DC
  Administered 2016-02-19 – 2016-02-20 (×2): 40 mg via ORAL
  Filled 2016-02-19 (×4): qty 1

## 2016-02-19 MED ORDER — POTASSIUM CHLORIDE CRYS ER 20 MEQ PO TBCR
40.0000 meq | EXTENDED_RELEASE_TABLET | Freq: Two times a day (BID) | ORAL | Status: AC
  Administered 2016-02-19 (×2): 40 meq via ORAL
  Filled 2016-02-19 (×2): qty 2

## 2016-02-19 MED ORDER — FREE WATER
200.0000 mL | Freq: Four times a day (QID) | Status: DC
  Administered 2016-02-19 (×2): 200 mL via ORAL
  Administered 2016-02-19: 100 mL via ORAL
  Administered 2016-02-20: 200 mL via ORAL

## 2016-02-19 MED ORDER — DEXTROSE 5 % IV SOLN
INTRAVENOUS | Status: DC
  Administered 2016-02-19 – 2016-02-20 (×3): via INTRAVENOUS

## 2016-02-19 NOTE — Progress Notes (Signed)
[name]    Progress Note  [name]  [id number] DOB: [birth year] DOA: 02/15/2016 PCP: [name], FNP     Brief Narrative:   [name] is an 75 y.o. male with history of metastatic prostate cancer, diabetes mellitus type 2, hypertension, anemia and chronic kidney disease was referred to the ER after patient's hemoglobin was found to be around 7.Patient is torn or melanotic FOBT was positive.  Assessment/Plan:   Acute GI bleeding Due to esophagitis: Status post 2 units of packed red blood cells, hemoglobin today is 9.5. EGD showed mild esophagitis continue Protonix twice a day.  GI recommended to continue a full liquid diet and await biopsy results for possible malignancy. If negative will proceed with next injection on 02/20/2016. GI to recommended to start anticoagulation. Cont a full liquid diet.  Achalasia: For possible Botox injection on 04/22/2015. The pending the results of biopsy results.  Essential hypertension: Resume antihypertensive medications. Blood pressure control.  PROSTATE CANCER, HX OF  Normocytic Anemia  Recently diagnosed chronic DVT: Awaiting GI recommendations to start anticoagulation. Continue to hold Lovenox.  Hypokalemia: Is been slowly improving, continue oral supplementation.  Leukocytosis and thrombocytosis: Unclear etiology, he also had thrombocytosis has remained afebrile with leukocytosis ? due to metastatic prostate cancer. No clear source of infection, he continues to remain afebrile.  Hypernatremia: Question due to decreased oral intake, check urinary sodium and urinary creatinine. D5W encourage oral free water, recheck a basic minimal panel in the morning.   DVT prophylaxis: SCD Family Communication:none Disposition Plan/Barrier to D/C: Hopefully 1-2 days. Code Status:     Code Status Orders        Start     Ordered   02/16/16 0054  Full code  Continuous     02/16/16 0054    Code Status History    Date Active Date Inactive Code Status Order ID Comments User Context   01/29/2016 12:40 AM 02/02/2016  8:06 PM Full Code [id number]  Lily Kocher, MD Inpatient   01/09/2016 12:01 AM 01/11/2016  6:36 PM Full Code [id number]  Karmen Bongo, MD Inpatient        IV Access:    Peripheral IV   Procedures and diagnostic studies:   No results found.   Medical Consultants:    None.  Anti-Infectives:   None  Subjective:    [name] he relates he feels great has no new complaints.  Objective:    Vitals:   02/18/16 1100 02/18/16 1526 02/18/16 2308 02/19/16 0431  BP: 115/68 104/63 139/70 (!) 131/59  Pulse: 83 90 90 88  Resp:  20 16 16   Temp:  97.4 F (36.3 C) 98 F (36.7 C) 99.2 F (37.3 C)  TempSrc:  Oral Oral Axillary  SpO2:  100% 100% 93%  Weight:      Height:        Intake/Output Summary (Last 24 hours) at 02/19/16 0824 Last data filed at 02/19/16 0432  Gross per 24 hour  Intake              300 ml  Output              950 ml  Net             -650 ml   Filed Weights   02/16/16 0353  Weight: 67.6 kg (149 lb)    Exam: General exam: In no acute distress. Respiratory system: Good air movement and clear to auscultation. Cardiovascular system:  S1 & S2 heard, RRR.  Gastrointestinal system: Abdomen is nondistended, soft and nontender.  Extremities: No pedal edema. Skin: No rashes, lesions or ulcers Psychiatry: Judgement and insight appear normal. Mood & affect appropriate.    Data Reviewed:    Labs: Basic Metabolic Panel:  Recent Labs Lab 02/15/16 2054 02/16/16 0529 02/17/16 0340 02/18/16 0354 02/19/16 0525  NA 140 144 144 149* 151*  K 2.7* 3.2* 3.0* 3.2* 3.4*  CL 104 110 113* 115* 116*  CO2 26 26 25 25 23   GLUCOSE 233* 191* 142* 129* 149*  BUN 84* 77* 78* 79* 76*  CREATININE 3.65* 3.27* 3.24* 2.94* 2.97*  CALCIUM 7.5* 7.4* 7.6* 8.2* 8.4*   GFR Estimated Creatinine Clearance: 20.9 mL/min (by C-G formula  based on SCr of 2.97 mg/dL (H)). Liver Function Tests: No results for input(s): AST, ALT, ALKPHOS, BILITOT, PROT, ALBUMIN in the last 168 hours. No results for input(s): LIPASE, AMYLASE in the last 168 hours. No results for input(s): AMMONIA in the last 168 hours. Coagulation profile  Recent Labs Lab 02/15/16 2011  INR 1.03    CBC:  Recent Labs Lab 02/15/16 2011 02/16/16 0529 02/17/16 0340  WBC 14.9* 15.5* 13.9*  NEUTROABS 12.8*  --   --   HGB 7.1* 9.5* 9.9*  HCT 21.3* 28.2* 29.4*  MCV 84.5 84.7 85.5  PLT 535* 458* 448*   Cardiac Enzymes: No results for input(s): CKTOTAL, CKMB, CKMBINDEX, TROPONINI in the last 168 hours. BNP (last 3 results) No results for input(s): PROBNP in the last 8760 hours. CBG:  Recent Labs Lab 02/18/16 0759 02/18/16 1150 02/18/16 1651 02/18/16 2314 02/19/16 0750  GLUCAP 128* 153* 111* 95 125*   D-Dimer: No results for input(s): DDIMER in the last 72 hours. Hgb A1c: No results for input(s): HGBA1C in the last 72 hours. Lipid Profile: No results for input(s): CHOL, HDL, LDLCALC, TRIG, CHOLHDL, LDLDIRECT in the last 72 hours. Thyroid function studies: No results for input(s): TSH, T4TOTAL, T3FREE, THYROIDAB in the last 72 hours.  Invalid input(s): FREET3 Anemia work up: No results for input(s): VITAMINB12, FOLATE, FERRITIN, TIBC, IRON, RETICCTPCT in the last 72 hours. Sepsis Labs:  Recent Labs Lab 02/15/16 2011 02/16/16 0529 02/17/16 0340  WBC 14.9* 15.5* 13.9*   Microbiology Recent Results (from the past 240 hour(s))  MRSA PCR Screening     Status: None   Collection Time: 02/16/16  4:54 AM  Result Value Ref Range Status   MRSA by PCR NEGATIVE NEGATIVE Final    Comment:        The GeneXpert MRSA Assay (FDA approved for NASAL specimens only), is one component of a comprehensive MRSA colonization surveillance program. It is not intended to diagnose MRSA infection nor to guide or monitor treatment for MRSA infections.       Medications:   . atenolol  25 mg Oral Daily  . atorvastatin  10 mg Oral QPC supper  . feeding supplement (ENSURE ENLIVE)  237 mL Oral BID BM  . ferrous sulfate  325 mg Oral Q breakfast  . furosemide  40 mg Oral Daily  . insulin aspart  0-9 Units Subcutaneous TID WC  . pantoprazole  40 mg Oral BID AC   Continuous Infusions:  Time spent: 25 min   LOS: 3 days   [name]  [facility] Pager [phone]  *Please refer to [email or web address], password [email or web address] to get updated schedule on who will round on this patient, as hospitalists switch teams weekly. If 7PM-7AM, please contact night-coverage  at [email or web address], password [email or web address] for any overnight needs.  02/19/2016, 8:24 AM

## 2016-02-19 NOTE — Progress Notes (Signed)
[facility] Progress Note    Since last GI note: Laying in bed, sleepy.  Feels comfortable.  Objective: Vital signs in last 24 hours: Temp:  [97.4 F (36.3 C)-99.2 F (37.3 C)] 99.2 F (37.3 C) (11/12 0431) Pulse Rate:  [83-90] 88 (11/12 0431) Resp:  [16-20] 16 (11/12 0431) BP: (104-139)/(59-70) 131/59 (11/12 0431) SpO2:  [93 %-100 %] 93 % (11/12 0431) Last BM Date: 02/18/16 General: alert and oriented times 1 or 2 Heart: regular rate and rythm Abdomen: soft, non-tender, non-distended, normal bowel sounds   Lab Results:  Recent Labs  02/17/16 0340  WBC 13.9*  HGB 9.9*  PLT 448*  MCV 85.5    Recent Labs  02/17/16 0340 02/18/16 0354 02/19/16 0525  NA 144 149* 151*  K 3.0* 3.2* 3.4*  CL 113* 115* 116*  CO2 25 25 23   GLUCOSE 142* 129* 149*  BUN 78* 79* 76*  CREATININE 3.24* 2.94* 2.97*  CALCIUM 7.6* 8.2* 8.4*    Medications: Scheduled Meds: . atenolol  25 mg Oral Daily  . atorvastatin  10 mg Oral QPC supper  . feeding supplement (ENSURE ENLIVE)  237 mL Oral BID BM  . ferrous sulfate  325 mg Oral Q breakfast  . furosemide  40 mg Oral Daily  . insulin aspart  0-9 Units Subcutaneous TID WC  . pantoprazole (PROTONIX) IV  40 mg Intravenous Q12H   Continuous Infusions: PRN Meds:.acetaminophen **OR** acetaminophen, HYDROcodone-acetaminophen    Assessment/Plan: 75 y.o. male with achalasia, esophagus was filled with food on Friday EGD, abnormal GE junction mucosa.  All in the setting of FTT and metastatic prostate cancer.  The EGD finding on Friday was  unexpected; indication for procedure was FOBT anemia actually.  Will probably be OK to restart anticoagulation after GI workup/treatment for esophagus is complete (see below)  Clearly he has achalasia.  The GE junction mucosa was abnormal appearing, I have some concern for underlying malignancy although that will probably not be the case.  He should not advance diet past full liquids (need to  encourage this diet, also twice daily nutrition drinks).  I am changing him from IV to oral PPI twice daily this morning..  If biopsies from his GE junction prove that he does NOT have malignancy then we will proceed with Botox GE junction injection in hospital. I discussed the plan with his son again, he is heading back to [place] today.  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  The biopsy result should be available tomorrow (monday)   [name], MD  02/19/2016, 7:47 AM [facility] Pager [phone]

## 2016-02-19 NOTE — Progress Notes (Signed)
PT Cancellation Note  Patient Details Name: [name] MRN: [id number] DOB: [birth year]   Cancelled Treatment:    Reason Eval/Treat Not Completed: Attempted PT eval-pt declined to participate on today due to fatigue -"Im tired." Pt is agreeable to PT checking back on tomorrow. Will check back as schedule allows.    Weston Anna, MPT Pager: [phone]

## 2016-02-20 ENCOUNTER — Encounter (HOSPITAL_COMMUNITY): Payer: Self-pay | Admitting: Gastroenterology

## 2016-02-20 DIAGNOSIS — T18128D Food in esophagus causing other injury, subsequent encounter: Secondary | ICD-10-CM

## 2016-02-20 DIAGNOSIS — R195 Other fecal abnormalities: Secondary | ICD-10-CM

## 2016-02-20 LAB — CBC
HCT: 25.9 % — ABNORMAL LOW (ref 39.0–52.0)
HEMOGLOBIN: 8.5 g/dL — AB (ref 13.0–17.0)
MCH: 28.5 pg (ref 26.0–34.0)
MCHC: 32.8 g/dL (ref 30.0–36.0)
MCV: 86.9 fL (ref 78.0–100.0)
PLATELETS: 420 10*3/uL — AB (ref 150–400)
RBC: 2.98 MIL/uL — ABNORMAL LOW (ref 4.22–5.81)
RDW: 15.8 % — ABNORMAL HIGH (ref 11.5–15.5)
WBC: 12.8 10*3/uL — ABNORMAL HIGH (ref 4.0–10.5)

## 2016-02-20 LAB — BASIC METABOLIC PANEL
Anion gap: 9 (ref 5–15)
BUN: 63 mg/dL — AB (ref 6–20)
CO2: 25 mmol/L (ref 22–32)
CREATININE: 2.7 mg/dL — AB (ref 0.61–1.24)
Calcium: 8 mg/dL — ABNORMAL LOW (ref 8.9–10.3)
Chloride: 113 mmol/L — ABNORMAL HIGH (ref 101–111)
GFR calc Af Amer: 25 mL/min — ABNORMAL LOW (ref >60)
GFR, EST NON AFRICAN AMERICAN: 22 mL/min — AB (ref >60)
GLUCOSE: 261 mg/dL — AB (ref 65–99)
Potassium: 3.1 mmol/L — ABNORMAL LOW (ref 3.5–5.1)
SODIUM: 147 mmol/L — AB (ref 135–145)

## 2016-02-20 LAB — GLUCOSE, CAPILLARY
GLUCOSE-CAPILLARY: 256 mg/dL — AB (ref 65–99)
GLUCOSE-CAPILLARY: 49 mg/dL — AB (ref 65–99)
Glucose-Capillary: 308 mg/dL — ABNORMAL HIGH (ref 65–99)
Glucose-Capillary: 85 mg/dL (ref 65–99)
Glucose-Capillary: 96 mg/dL (ref 65–99)

## 2016-02-20 MED ORDER — DEXTROSE 50 % IV SOLN
INTRAVENOUS | Status: AC
  Administered 2016-02-20: 50 mL
  Filled 2016-02-20: qty 50

## 2016-02-20 MED ORDER — INSULIN GLARGINE 100 UNIT/ML ~~LOC~~ SOLN
5.0000 [IU] | Freq: Two times a day (BID) | SUBCUTANEOUS | Status: DC
  Administered 2016-02-20: 5 [IU] via SUBCUTANEOUS
  Filled 2016-02-20 (×3): qty 0.05

## 2016-02-20 MED ORDER — POTASSIUM CHLORIDE 2 MEQ/ML IV SOLN
INTRAVENOUS | Status: DC
  Administered 2016-02-20 – 2016-02-21 (×2): via INTRAVENOUS
  Filled 2016-02-20 (×3): qty 1000

## 2016-02-20 MED ORDER — POTASSIUM CHLORIDE CRYS ER 20 MEQ PO TBCR
40.0000 meq | EXTENDED_RELEASE_TABLET | Freq: Two times a day (BID) | ORAL | Status: AC
  Administered 2016-02-20 (×2): 40 meq via ORAL
  Filled 2016-02-20 (×2): qty 2

## 2016-02-20 NOTE — Progress Notes (Addendum)
[name]    Progress Note  RABIH PASQUAL  [id number] DOB: [birth year] DOA: 02/15/2016 PCP: [name], FNP     Brief Narrative:   [name] is an 75 y.o. male with history of metastatic prostate cancer, diabetes mellitus type 2, hypertension, anemia and chronic kidney disease was referred to the ER after patient's hemoglobin was found to be around 7.Patient is torn or melanotic FOBT was positive.  Assessment/Plan:   Acute GI bleeding Due to esophagitis: Status post 2 units of packed red blood cells, hemoglobin today is 9.5. EGD showed mild esophagitis continue Protonix twice a day.  GI recommended to continue a full liquid diet and await biopsy results for possible malignancy. If negative will proceed with next injection on 02/20/2016.  Achalasia: For possible Botox injection on 04/22/2015. The pending the results of biopsy results.  Essential hypertension: Resume antihypertensive medications. Blood pressure control.  PROSTATE CANCER, HX OF  Normocytic Anemia Hbg stable.  Recently diagnosed chronic DVT: Awaiting GI recommendations to start anticoagulation. Continue to hold Lovenox.  Hypokalemia: Is been slowly improving, continue oral supplementation.  Leukocytosis and thrombocytosis: Unclear etiology, he also had thrombocytosis has remained afebrile with leukocytosis ? due to metastatic prostate cancer. No clear source of infection, he continues to remain afebrile.  Hypernatremia: Question due to decreased oral intake, And D5, will encourage free water per mouth.  Diabetes mellitus type 2: Oral hypoglycemic agents were stopped, as he is getting D5 his blood glucose trending high will start him on Lantus twice a day continue sliding scale insulin.  DVT prophylaxis: SCD Family Communication:none Disposition Plan/Barrier to D/C: Hopefully 1-2 days. Code Status:     Code Status Orders        Start     Ordered   02/16/16 0054  Full code  Continuous     02/16/16 0054    Code Status History    Date Active Date Inactive Code Status Order ID Comments User Context   01/29/2016 12:40 AM 02/02/2016  8:06 PM Full Code [id number]  Lily Kocher, MD Inpatient   01/09/2016 12:01 AM 01/11/2016  6:36 PM Full Code [id number]  Karmen Bongo, MD Inpatient        IV Access:    Peripheral IV   Procedures and diagnostic studies:   No results found.   Medical Consultants:    None.  Anti-Infectives:   None  Subjective:    [name] he relates he feels great has no new complaints.  Objective:    Vitals:   02/19/16 0431 02/19/16 1339 02/19/16 2037 02/20/16 0614  BP: (!) 131/59 (!) 96/55 (!) 142/67 (!) 131/58  Pulse: 88 80 83 88  Resp: 16 20 20 20   Temp: 99.2 F (37.3 C) 97.8 F (36.6 C) 98.6 F (37 C) 98.7 F (37.1 C)  TempSrc: Axillary Oral Oral Oral  SpO2: 93% 100%  97%  Weight:      Height:        Intake/Output Summary (Last 24 hours) at 02/20/16 1038 Last data filed at 02/20/16 0615  Gross per 24 hour  Intake             2450 ml  Output             1435 ml  Net             1015 ml   Filed Weights   02/16/16 0353  Weight: 67.6 kg (149 lb)    Exam: General exam:  In no acute distress. Respiratory system: Good air movement and clear to auscultation. Cardiovascular system: S1 & S2 heard, RRR.  Gastrointestinal system: Abdomen is nondistended, soft and nontender.  Extremities: No pedal edema. Skin: No rashes, lesions or ulcers Psychiatry: Judgement and insight appear normal. Mood & affect appropriate.    Data Reviewed:    Labs: Basic Metabolic Panel:  Recent Labs Lab 02/16/16 0529 02/17/16 0340 02/18/16 0354 02/19/16 0525 02/20/16 0425  NA 144 144 149* 151* 147*  K 3.2* 3.0* 3.2* 3.4* 3.1*  CL 110 113* 115* 116* 113*  CO2 26 25 25 23 25   GLUCOSE 191* 142* 129* 149* 261*  BUN 77* 78* 79* 76* 63*  CREATININE 3.27* 3.24* 2.94* 2.97* 2.70*  CALCIUM 7.4*  7.6* 8.2* 8.4* 8.0*   GFR Estimated Creatinine Clearance: 23 mL/min (by C-G formula based on SCr of 2.7 mg/dL (H)). Liver Function Tests: No results for input(s): AST, ALT, ALKPHOS, BILITOT, PROT, ALBUMIN in the last 168 hours. No results for input(s): LIPASE, AMYLASE in the last 168 hours. No results for input(s): AMMONIA in the last 168 hours. Coagulation profile  Recent Labs Lab 02/15/16 2011  INR 1.03    CBC:  Recent Labs Lab 02/15/16 2011 02/16/16 0529 02/17/16 0340 02/20/16 0425  WBC 14.9* 15.5* 13.9* 12.8*  NEUTROABS 12.8*  --   --   --   HGB 7.1* 9.5* 9.9* 8.5*  HCT 21.3* 28.2* 29.4* 25.9*  MCV 84.5 84.7 85.5 86.9  PLT 535* 458* 448* 420*   Cardiac Enzymes: No results for input(s): CKTOTAL, CKMB, CKMBINDEX, TROPONINI in the last 168 hours. BNP (last 3 results) No results for input(s): PROBNP in the last 8760 hours. CBG:  Recent Labs Lab 02/19/16 1155 02/19/16 1443 02/19/16 1714 02/19/16 2107 02/20/16 0743  GLUCAP 164* 186* 140* 165* 256*   D-Dimer: No results for input(s): DDIMER in the last 72 hours. Hgb A1c: No results for input(s): HGBA1C in the last 72 hours. Lipid Profile: No results for input(s): CHOL, HDL, LDLCALC, TRIG, CHOLHDL, LDLDIRECT in the last 72 hours. Thyroid function studies: No results for input(s): TSH, T4TOTAL, T3FREE, THYROIDAB in the last 72 hours.  Invalid input(s): FREET3 Anemia work up: No results for input(s): VITAMINB12, FOLATE, FERRITIN, TIBC, IRON, RETICCTPCT in the last 72 hours. Sepsis Labs:  Recent Labs Lab 02/15/16 2011 02/16/16 0529 02/17/16 0340 02/20/16 0425  WBC 14.9* 15.5* 13.9* 12.8*   Microbiology Recent Results (from the past 240 hour(s))  MRSA PCR Screening     Status: None   Collection Time: 02/16/16  4:54 AM  Result Value Ref Range Status   MRSA by PCR NEGATIVE NEGATIVE Final    Comment:        The GeneXpert MRSA Assay (FDA approved for NASAL specimens only), is one component of  a comprehensive MRSA colonization surveillance program. It is not intended to diagnose MRSA infection nor to guide or monitor treatment for MRSA infections.      Medications:   . atenolol  25 mg Oral Daily  . atorvastatin  10 mg Oral QPC supper  . feeding supplement (ENSURE ENLIVE)  237 mL Oral BID BM  . ferrous sulfate  325 mg Oral Q breakfast  . free water  200 mL Oral Q6H  . furosemide  40 mg Oral Daily  . insulin aspart  0-5 Units Subcutaneous QHS  . insulin aspart  0-9 Units Subcutaneous TID WC  . pantoprazole  40 mg Oral BID AC   Continuous Infusions: . dextrose  100 mL/hr at 02/20/16 0549    Time spent: 25 min   LOS: 4 days   [name]  [facility] Pager [phone]  *Please refer to [email or web address], password [email or web address] to get updated schedule on who will round on this patient, as hospitalists switch teams weekly. If 7PM-7AM, please contact night-coverage at [email or web address], password [email or web address] for any overnight needs.  02/20/2016, 10:38 AM

## 2016-02-20 NOTE — Progress Notes (Signed)
Patient ID: [name], male   DOB: 1940/12/10, 75 y.o.   MRN: [id number]    Progress Note   Subjective   No complaints, denies any pain, nausea- waiting for breakfast  Last hgb 11/10- 9.9 Esophageal bx - pending   Objective   Vital signs in last 24 hours: Temp:  [97.8 F (36.6 C)-98.7 F (37.1 C)] 98.7 F (37.1 C) (11/13 0614) Pulse Rate:  [80-88] 88 (11/13 0614) Resp:  [20] 20 (11/13 0614) BP: (96-142)/(55-67) 131/58 (11/13 0614) SpO2:  [97 %-100 %] 97 % (11/13 0614) Last BM Date: 02/18/16 General:    AA male  in NAD Heart:  Regular rate and rhythm; no murmurs Lungs: Respirations even and unlabored, lungs CTA bilaterally Abdomen:  Soft, nontender and nondistended. Normal bowel sounds. Extremities:  Without edema. Neurologic:  Alert and oriented,  grossly normal neurologically. Psych:  Cooperative pleasantly demented  Intake/Output from previous day: 11/12 0701 - 11/13 0700 In: 2550 [P.O.:340; I.V.:2000; NG/GT:210] Out: 1435 [Urine:1435] Intake/Output this shift: No intake/output data recorded.  Lab Results: No results for input(s): WBC, HGB, HCT, PLT in the last 72 hours. BMET  Recent Labs  02/18/16 0354 02/19/16 0525 02/20/16 0425  NA 149* 151* 147*  K 3.2* 3.4* 3.1*  CL 115* 116* 113*  CO2 25 23 25   GLUCOSE 129* 149* 261*  BUN 79* 76* 63*  CREATININE 2.94* 2.97* 2.70*  CALCIUM 8.2* 8.4* 8.0*   LFT No results for input(s): PROT, ALBUMIN, AST, ALT, ALKPHOS, BILITOT, BILIDIR, IBILI in the last 72 hours. PT/INR No results for input(s): LABPROT, INR in the last 72 hours.  Studies/Results: No results found.     Assessment / Plan:    #1  75 yo AA male with mild dementia seen for heme + stool and anemia- EGD showed Achalasia and abnormal mucosa at the GE junction -bx taken to r/o malignancy - pending  No Active bleeding Pt tolerating full liquid diet/Ensure Continue BID PPI Plan is for Botox if bx negative #2 CKD #3 chronic DVT - new dx- was  on Lovenox- holding until GI w/u complete  #4 metastatic prostate CA  Principal Problem:   Gastrointestinal hemorrhage Active Problems:   Essential hypertension   PROSTATE CANCER, HX OF   Anemia   Acute blood loss anemia   Acute esophagitis   Food impaction of esophagus   Achalasia of esophagus   Pressure injury of skin   Hypernatremia     LOS: 4 days   Kearie Mennen  02/20/2016, 8:46 AM

## 2016-02-20 NOTE — Progress Notes (Signed)
Hypoglycemic Event  CBG: 49  Treatment: D50 IV 50 mL  Symptoms: Sweaty, Shaky and Nervous/irritable  Follow-up CBG: Time: 2332 CBG Result:96  Possible Reasons for Event: Inadequate meal intake and Medication regimen: D5 maintenance fluids decreased to 50 ml/hr  Comments/MD notified:K.Schorr notified, sliding scale insulin and lantus held.    Adah Salvage

## 2016-02-20 NOTE — Progress Notes (Signed)
PT Cancellation Note / Screen  Patient Details Name: [name] MRN: [id number] DOB: 07-19-1940   Cancelled Treatment:    Reason Eval/Treat Not Completed: PT screened, no needs identified, will sign off Pt declining PT at this time.  Pt appears confused and mumbling most of the time (RN aware).  Pt is from SNF and plans to return to SNF. Will defer PT needs to SNF.  Please re-order if PT evaluation required for d/c back to SNF.   Rohen Kimes,KATHrine E 02/20/2016, 12:03 PM Carmelia Bake, PT, DPT 02/20/2016 Pager: [phone]

## 2016-02-21 ENCOUNTER — Inpatient Hospital Stay (HOSPITAL_COMMUNITY): Payer: Medicare Other

## 2016-02-21 ENCOUNTER — Encounter (HOSPITAL_COMMUNITY)
Admission: EM | Disposition: A | Discharge status: Skilled Nursing Facility | Payer: Self-pay | Source: Home / Self Care | Attending: Family Medicine

## 2016-02-21 ENCOUNTER — Inpatient Hospital Stay (HOSPITAL_COMMUNITY): Payer: Medicare Other | Admitting: Certified Registered Nurse Anesthetist

## 2016-02-21 ENCOUNTER — Encounter (HOSPITAL_COMMUNITY): Payer: Self-pay

## 2016-02-21 DIAGNOSIS — A419 Sepsis, unspecified organism: Secondary | ICD-10-CM

## 2016-02-21 DIAGNOSIS — F05 Delirium due to known physiological condition: Secondary | ICD-10-CM

## 2016-02-21 DIAGNOSIS — R6510 Systemic inflammatory response syndrome (SIRS) of non-infectious origin without acute organ dysfunction: Secondary | ICD-10-CM

## 2016-02-21 HISTORY — PX: ESOPHAGOGASTRODUODENOSCOPY (EGD) WITH PROPOFOL: SHX5813

## 2016-02-21 LAB — BASIC METABOLIC PANEL
Anion gap: 8 (ref 5–15)
BUN: 57 mg/dL — AB (ref 6–20)
CALCIUM: 8 mg/dL — AB (ref 8.9–10.3)
CHLORIDE: 113 mmol/L — AB (ref 101–111)
CO2: 25 mmol/L (ref 22–32)
CREATININE: 2.4 mg/dL — AB (ref 0.61–1.24)
GFR calc non Af Amer: 25 mL/min — ABNORMAL LOW (ref >60)
GFR, EST AFRICAN AMERICAN: 29 mL/min — AB (ref >60)
Glucose, Bld: 104 mg/dL — ABNORMAL HIGH (ref 65–99)
Potassium: 3 mmol/L — ABNORMAL LOW (ref 3.5–5.1)
Sodium: 146 mmol/L — ABNORMAL HIGH (ref 135–145)

## 2016-02-21 LAB — GLUCOSE, CAPILLARY
GLUCOSE-CAPILLARY: 113 mg/dL — AB (ref 65–99)
GLUCOSE-CAPILLARY: 169 mg/dL — AB (ref 65–99)
GLUCOSE-CAPILLARY: 195 mg/dL — AB (ref 65–99)

## 2016-02-21 LAB — CBC
HEMATOCRIT: 27.8 % — AB (ref 39.0–52.0)
HEMOGLOBIN: 9 g/dL — AB (ref 13.0–17.0)
MCH: 28.7 pg (ref 26.0–34.0)
MCHC: 32.4 g/dL (ref 30.0–36.0)
MCV: 88.5 fL (ref 78.0–100.0)
Platelets: 409 10*3/uL — ABNORMAL HIGH (ref 150–400)
RBC: 3.14 MIL/uL — ABNORMAL LOW (ref 4.22–5.81)
RDW: 16.2 % — AB (ref 11.5–15.5)
WBC: 15.1 10*3/uL — AB (ref 4.0–10.5)

## 2016-02-21 LAB — MAGNESIUM: Magnesium: 1.2 mg/dL — ABNORMAL LOW (ref 1.7–2.4)

## 2016-02-21 SURGERY — ESOPHAGOGASTRODUODENOSCOPY (EGD) WITH PROPOFOL
Anesthesia: Monitor Anesthesia Care

## 2016-02-21 MED ORDER — PHENYLEPHRINE HCL 10 MG/ML IJ SOLN
INTRAMUSCULAR | Status: DC | PRN
  Administered 2016-02-21: 120 ug via INTRAVENOUS
  Administered 2016-02-21 (×4): 80 ug via INTRAVENOUS
  Administered 2016-02-21 (×2): 120 ug via INTRAVENOUS

## 2016-02-21 MED ORDER — PANTOPRAZOLE SODIUM 40 MG IV SOLR
40.0000 mg | Freq: Two times a day (BID) | INTRAVENOUS | Status: DC
  Administered 2016-02-21 – 2016-02-29 (×17): 40 mg via INTRAVENOUS
  Filled 2016-02-21 (×21): qty 40

## 2016-02-21 MED ORDER — VANCOMYCIN HCL IN DEXTROSE 750-5 MG/150ML-% IV SOLN
750.0000 mg | INTRAVENOUS | Status: DC
  Administered 2016-02-21 – 2016-02-22 (×2): 750 mg via INTRAVENOUS
  Filled 2016-02-21 (×3): qty 150

## 2016-02-21 MED ORDER — PROPOFOL 10 MG/ML IV BOLUS
INTRAVENOUS | Status: DC | PRN
  Administered 2016-02-21: 100 mg via INTRAVENOUS

## 2016-02-21 MED ORDER — SODIUM CHLORIDE 0.9 % IV BOLUS (SEPSIS)
500.0000 mL | Freq: Once | INTRAVENOUS | Status: AC
  Administered 2016-02-21: 500 mL via INTRAVENOUS

## 2016-02-21 MED ORDER — OXYCODONE HCL 5 MG/5ML PO SOLN: 5.0000 mg | Freq: Once | ORAL | Status: DC | PRN

## 2016-02-21 MED ORDER — ONDANSETRON HCL 4 MG/2ML IJ SOLN: 4.0000 mg | Freq: Four times a day (QID) | INTRAMUSCULAR | Status: DC | PRN

## 2016-02-21 MED ORDER — HALOPERIDOL LACTATE 5 MG/ML IJ SOLN
1.0000 mg | Freq: Four times a day (QID) | INTRAMUSCULAR | Status: DC | PRN
  Administered 2016-02-24: 1 mg via INTRAVENOUS
  Filled 2016-02-21: qty 1
  Filled 2016-02-21: qty 0.2

## 2016-02-21 MED ORDER — POTASSIUM CHLORIDE 2 MEQ/ML IV SOLN
INTRAVENOUS | Status: DC
  Administered 2016-02-21 – 2016-02-22 (×2): via INTRAVENOUS
  Filled 2016-02-21 (×4): qty 1000

## 2016-02-21 MED ORDER — PIPERACILLIN-TAZOBACTAM 3.375 G IVPB
3.3750 g | Freq: Three times a day (TID) | INTRAVENOUS | Status: AC
Start: 2016-02-21 — End: 2016-02-28
  Administered 2016-02-21 – 2016-02-28 (×21): 3.375 g via INTRAVENOUS
  Filled 2016-02-21 (×25): qty 50

## 2016-02-21 MED ORDER — ETOMIDATE 2 MG/ML IV SOLN
INTRAVENOUS | Status: AC
  Filled 2016-02-21: qty 10

## 2016-02-21 MED ORDER — LACTATED RINGERS IV SOLN: INTRAVENOUS | Status: DC | PRN

## 2016-02-21 MED ORDER — FENTANYL CITRATE (PF) 100 MCG/2ML IJ SOLN
INTRAMUSCULAR | Status: AC
  Filled 2016-02-21: qty 2

## 2016-02-21 MED ORDER — OXYCODONE HCL 5 MG PO TABS: 5.0000 mg | ORAL_TABLET | Freq: Once | ORAL | Status: DC | PRN

## 2016-02-21 MED ORDER — PROPOFOL 10 MG/ML IV BOLUS
INTRAVENOUS | Status: AC
  Filled 2016-02-21: qty 20

## 2016-02-21 MED ORDER — ONABOTULINUMTOXINA 100 UNITS IJ SOLR
INTRAMUSCULAR | Status: AC
  Filled 2016-02-21: qty 100

## 2016-02-21 MED ORDER — EPHEDRINE SULFATE 50 MG/ML IJ SOLN
INTRAMUSCULAR | Status: DC | PRN
  Administered 2016-02-21 (×4): 10 mg via INTRAVENOUS
  Administered 2016-02-21: 15 mg via INTRAVENOUS

## 2016-02-21 MED ORDER — FENTANYL CITRATE (PF) 100 MCG/2ML IJ SOLN: 25.0000 ug | INTRAMUSCULAR | Status: DC | PRN

## 2016-02-21 MED ORDER — SODIUM CHLORIDE 0.9 % IV SOLN
INTRAVENOUS | Status: DC | PRN
  Administered 2016-02-21: 12:00:00 via INTRAVENOUS

## 2016-02-21 MED ORDER — MAGNESIUM SULFATE 2 GM/50ML IV SOLN
2.0000 g | Freq: Once | INTRAVENOUS | Status: AC
  Administered 2016-02-21: 2 g via INTRAVENOUS
  Filled 2016-02-21 (×2): qty 50

## 2016-02-21 MED ORDER — ONDANSETRON HCL 4 MG/2ML IJ SOLN
INTRAMUSCULAR | Status: DC | PRN
  Administered 2016-02-21: 4 mg via INTRAVENOUS

## 2016-02-21 MED ORDER — LORAZEPAM 2 MG/ML IJ SOLN
1.0000 mg | Freq: Once | INTRAMUSCULAR | Status: AC
  Administered 2016-02-21: 1 mg via INTRAVENOUS
  Filled 2016-02-21: qty 1

## 2016-02-21 MED ORDER — ONDANSETRON HCL 4 MG/2ML IJ SOLN
INTRAMUSCULAR | Status: AC
  Filled 2016-02-21: qty 2

## 2016-02-21 MED ORDER — SODIUM CHLORIDE 0.9 % IV SOLN
30.0000 meq | Freq: Once | INTRAVENOUS | Status: AC
  Administered 2016-02-21: 30 meq via INTRAVENOUS
  Filled 2016-02-21: qty 15

## 2016-02-21 MED ORDER — PIPERACILLIN-TAZOBACTAM 3.375 G IVPB 30 MIN: 3.3750 g | Freq: Four times a day (QID) | INTRAVENOUS | Status: DC

## 2016-02-21 MED ORDER — SUCCINYLCHOLINE CHLORIDE 20 MG/ML IJ SOLN
INTRAMUSCULAR | Status: DC | PRN
Start: 2016-02-21 — End: 2016-02-21
  Administered 2016-02-21: 100 mg via INTRAVENOUS

## 2016-02-21 MED ORDER — QUETIAPINE FUMARATE 50 MG PO TABS: 25.0000 mg | ORAL_TABLET | Freq: Every day | ORAL | Status: DC

## 2016-02-21 MED ORDER — LIDOCAINE 2% (20 MG/ML) 5 ML SYRINGE
INTRAMUSCULAR | Status: AC
  Filled 2016-02-21: qty 5

## 2016-02-21 SURGICAL SUPPLY — 14 items

## 2016-02-21 NOTE — Progress Notes (Addendum)
Became hypotensive during EGD no botox injected. Transfer to SDU for monitoring. Started on vanc and zosyn. Saturation have remained stable. GI concern about SIRS, keep in SDU monitor Vitals closely. Check CBC, B-met and CXR in am.

## 2016-02-21 NOTE — Addendum Note (Signed)
Addendum  created 02/21/16 1458 by West Pugh, CRNA   Anesthesia Intra Flowsheets edited, Charge Capture section accepted, Visit diagnoses modified

## 2016-02-21 NOTE — Progress Notes (Signed)
Inpatient Diabetes Program Recommendations  AACE/ADA: New Consensus Statement on Inpatient Glycemic Control (2015)  Target Ranges:  Prepandial:   less than 140 mg/dL      Peak postprandial:   less than 180 mg/dL (1-2 hours)      Critically ill patients:  140 - 180 mg/dL   Lab Results  Component Value Date   GLUCAP 113 (H) 02/21/2016   HGBA1C 7.1 (H) 12/21/2015    Review of Glycemic Control  Hypoglycemia on 11/13. Poor po intake.  Inpatient Diabetes Program Recommendations:    D/C Lantus 5 units bid. If pt not eating, would order Novolog sensitive Q4H.  Will continue to follow. Thank you. Lorenda Peck, RD, LDN, CDE Inpatient Diabetes Coordinator [phone]

## 2016-02-21 NOTE — Anesthesia Procedure Notes (Signed)
Procedure Name: Intubation Date/Time: 02/21/2016 12:23 PM Performed by: West Pugh Pre-anesthesia Checklist: Patient identified, Emergency Drugs available, Suction available, Patient being monitored and Timeout performed Patient Re-evaluated:Patient Re-evaluated prior to inductionOxygen Delivery Method: Circle system utilized Preoxygenation: Pre-oxygenation with 100% oxygen Intubation Type: IV induction, Cricoid Pressure applied and Rapid sequence Ventilation: Mask ventilation without difficulty Laryngoscope Size: Mac and 4 Grade View: Grade I Tube type: Oral Tube size: 7.5 mm Number of attempts: 1 Airway Equipment and Method: Stylet Placement Confirmation: ETT inserted through vocal cords under direct vision,  positive ETCO2,  CO2 detector and breath sounds checked- equal and bilateral Tube secured with: Tape Dental Injury: Teeth and Oropharynx as per pre-operative assessment

## 2016-02-21 NOTE — Progress Notes (Signed)
Pharmacy Antibiotic Note  [name] is a 75 y.o. male presented to the ED on 11/9 for anemia and acute GIB.  To start vancomycin and zosyn for suspected PNA.  - 11/14 CXR: Slight interval increase in left lower lobe atelectasis or pneumonia. - Tmax 103.1, wbc up 15.1, scr down 2.40 (crcl~25)   Plan: - zosyn 3.375 gm IV q8h (infuse over 4 hours) - vancomycin 750 mg IV q24h - monitor renal function closely and adj doses if needed ____________________________  Height: 5' 8.5" (174 cm) Weight: 149 lb (67.6 kg) IBW/kg (Calculated) : 69.55  Temp (24hrs), Avg:99.7 F (37.6 C), Min:98.1 F (36.7 C), Max:103.1 F (39.5 C)   Recent Labs Lab 02/15/16 2011  02/16/16 0529 02/17/16 0340 02/18/16 0354 02/19/16 0525 02/20/16 0425 02/21/16 0339  WBC 14.9*  --  15.5* 13.9*  --   --  12.8* 15.1*  CREATININE  --   < > 3.27* 3.24* 2.94* 2.97* 2.70* 2.40*  < > = values in this interval not displayed.  Estimated Creatinine Clearance: 25.8 mL/min (by C-G formula based on SCr of 2.4 mg/dL (H)).    No Known Allergies  Thank you for allowing pharmacy to be a part of this patient's care.  Lynelle Doctor 02/21/2016 1:08 PM

## 2016-02-21 NOTE — Anesthesia Postprocedure Evaluation (Signed)
Anesthesia Post Note  Patient: DAINEL OVERBEY  Procedure(s) Performed: Procedure(s) (LRB): ESOPHAGOGASTRODUODENOSCOPY (EGD) WITH PROPOFOL (N/A) BOTOX INJECTION (N/A)  Patient location during evaluation: PACU Anesthesia Type: MAC Level of consciousness: awake and alert Pain management: pain level controlled Vital Signs Assessment: post-procedure vital signs reviewed and stable Respiratory status: spontaneous breathing, nonlabored ventilation, respiratory function stable and patient connected to nasal cannula oxygen Cardiovascular status: stable and blood pressure returned to baseline Anesthetic complications: no    Last Vitals:  Vitals:   02/21/16 1320 02/21/16 1330  BP: (!) 109/45   Pulse: (!) 103 99  Resp: (!) 26   Temp:      Last Pain:  Vitals:   02/21/16 1302  TempSrc: Axillary  PainSc:                  [place]

## 2016-02-21 NOTE — Anesthesia Preprocedure Evaluation (Addendum)
Anesthesia Evaluation  Patient identified by MRN, date of birth, ID bandGeneral Assessment Comment:sedated  Reviewed: Allergy & Precautions, NPO status , Patient's Chart, lab work & pertinent test results  Airway Mallampati: II   Neck ROM: full    Dental   Pulmonary former smoker,    breath sounds clear to auscultation       Cardiovascular hypertension,  Rhythm:regular Rate:Normal     Neuro/Psych    GI/Hepatic Food impaction of esophagus   Endo/Other  diabetes, Type 2  Renal/GU Renal InsufficiencyRenal disease   Metastatic prostate CA.    Musculoskeletal   Abdominal   Peds  Hematology  (+) anemia ,   Anesthesia Other Findings   Reproductive/Obstetrics                            Anesthesia Physical Anesthesia Plan  ASA: III  Anesthesia Plan: General   Post-op Pain Management:    Induction: Intravenous, Rapid sequence and Cricoid pressure planned  Airway Management Planned: Oral ETT  Additional Equipment:   Intra-op Plan:   Post-operative Plan: Extubation in OR  Informed Consent: I have reviewed the patients History and Physical, chart, labs and discussed the procedure including the risks, benefits and alternatives for the proposed anesthesia with the patient or authorized representative who has indicated his/her understanding and acceptance.     Plan Discussed with: CRNA, Anesthesiologist and Surgeon  Anesthesia Plan Comments:         Anesthesia Quick Evaluation

## 2016-02-21 NOTE — Care Management Important Message (Signed)
Important Message  Patient Details  Name: TRAVAUGHN FRELICH MRN: [id number] Date of Birth: 1940/10/16   Medicare Important Message Given:  Yes    Camillo Flaming 02/21/2016, 9:27 AMImportant Message  Patient Details  Name: JERROL EMARD MRN: [id number] Date of Birth: [birth year]   Medicare Important Message Given:  Yes    Camillo Flaming 02/21/2016, 9:27 AM

## 2016-02-21 NOTE — Progress Notes (Signed)
Patient ID: [name], male   DOB: 08-Oct-1940, 75 y.o.   MRN: [id number]     Esophageal bx  Show acute inflammation /reactive changes , no malignancy  Will proceed with EGD with Botox to day per Dr Hilarie Fredrickson for treatment of Achalasia

## 2016-02-21 NOTE — Progress Notes (Addendum)
Pt spiked temp of 103.1. PRN tylenol given, on-call paged and order placed for chest x-ray, 500 cc bolus and blood cultures. Pt began vomitting, on-call paged to update. No new orders given for vomitting episode. Hostpialist paged after 7am, order given for PRN zofran. Will continue to monitor closely.

## 2016-02-21 NOTE — Transfer of Care (Signed)
Immediate Anesthesia Transfer of Care Note  Patient: [name]  Procedure(s) Performed: Procedure(s): ESOPHAGOGASTRODUODENOSCOPY (EGD) WITH PROPOFOL (N/A) BOTOX INJECTION (N/A)  Patient Location: PACU  Anesthesia Type:General  Level of Consciousness:  sedated, patient cooperative and responds to stimulation  Airway & Oxygen Therapy:Patient Spontanous Breathing and Patient connected to face mask oxgen  Post-op Assessment:  Report given to PACU RN and Post -op Vital signs reviewed and stable  Post vital signs:  Reviewed and stable  Last Vitals:  Vitals:   02/21/16 1146 02/21/16 1302  BP: 122/64 (!) 121/52  Pulse: (!) 102 (!) 105  Resp: (!) 23 (!) 34  Temp: 37.7 C [id number] C    Complications: No apparent anesthesia complications

## 2016-02-21 NOTE — Progress Notes (Addendum)
[name]    Progress Note  [name]  [id number] DOB: 1941/01/10 DOA: 02/15/2016 PCP: [name], FNP     Brief Narrative:   [name] is an 75 y.o. male with history of metastatic prostate cancer, diabetes mellitus type 2, hypertension, anemia and chronic kidney disease was referred to the ER after patient's hemoglobin was found to be around 7.Patient Has complained of melanotic FOBT was positive.  Assessment/Plan:   Acute GI bleeding Due to esophagitis: Status post 2 units of packed red blood cells, hemoglobin today is 9.5. EGD showed mild esophagitis and achalasia continue Protonix twice a day.  GI recommended to continue a full liquid diet and await biopsy results for possible malignancy. If negative will proceed with botox injection on 02/20/2016.  Achalasia: For possible Botox injection on 04/22/2015. The pending the results of biopsy results.  Essential hypertension: Resume antihypertensive medications. Blood pressure control.  PROSTATE CANCER, HX OF  Normocytic Anemia Hbg stable.  Recently diagnosed chronic DVT: Awaiting GI recommendations to start anticoagulation. Continue to hold Lovenox.  Hypokalemia: Is been slowly improving, continue oral supplementation.  Leukocytosis and thrombocytosis: Unclear etiology, he also had thrombocytosis has remained afebrile with leukocytosis ? due to metastatic prostate cancer. No clear source of infection, he continues to remain afebrile.  Hypernatremia: Question due to decreased oral intake, And D5, will encourage free water per mouth.  Diabetes mellitus type 2: Oral hypoglycemic agents were stopped, as he is getting D5 his blood glucose trending high will start him on Lantus twice a day continue sliding scale insulin.  Acute confusional state: Patient started getting confused on 02/21/2016 he was started on Seroquel at bedtime for possible delirium/acute confusional  state.  SIRS due to possible aspiration pneumonitis: Mild episode of aspiration, he is not hypoxic which is mild fever, some very mild leukocytosis, his chest x-ray shows a new lower lobe interstitial marking. He is laying in bed comfortable saying 100% on room air his fever has resolved he is not tachypnea. I will hold antibiotics. He was bikes fever or his leukocytosis worsened can start empiric antibiotics.  DVT prophylaxis: SCD Family Communication:none Disposition Plan/Barrier to D/C: Hopefully 1-2 days. Code Status:     Code Status Orders        Start     Ordered   02/16/16 0054  Full code  Continuous     02/16/16 0054    Code Status History    Date Active Date Inactive Code Status Order ID Comments User Context   01/29/2016 12:40 AM 02/02/2016  8:06 PM Full Code [id number]  Lily Kocher, MD Inpatient   01/09/2016 12:01 AM 01/11/2016  6:36 PM Full Code [id number]  Karmen Bongo, MD Inpatient        IV Access:    Peripheral IV   Procedures and diagnostic studies:   Dg Chest Port 1 View  Result Date: 02/21/2016 CLINICAL DATA:  Fever, nausea, and vomiting. EXAM: PORTABLE CHEST 1 VIEW COMPARISON:  Portable chest x-ray of February 02, 2016 FINDINGS: The lungs are adequately inflated. There is patchy increased density at the left lung base. The heart border remain sharp. A trace of pleural fluid blunts the left costophrenic angle. The heart and pulmonary vascularity is normal. There is prominence of the aortic arch and proximal descending thoracic aorta which is stable. The mediastinum is normal in width. The bony thorax exhibits no acute abnormality. IMPRESSION: Slight interval increase in left lower lobe atelectasis or pneumonia. Reportedly there is  a large hiatal hernia and a portion of the density may be related to this. No CHF. Trace left pleural effusion. Electronically Signed   By: David  Martinique M.D.   On: 02/21/2016 07:20     Medical Consultants:     None.  Anti-Infectives:   None  Subjective:    [name] patient today seems to be more awake. His took off his gown,he was refusing labs and medication he is even refusing EKG.  Objective:    Vitals:   02/20/16 1452 02/20/16 2157 02/21/16 0607 02/21/16 0710  BP: 109/77 (!) 125/57 (!) 124/55   Pulse: 76 84 97   Resp: 16 18 18    Temp: 98.1 F (36.7 C) 99 F (37.2 C) (!) 103.1 F (39.5 C) 99.6 F (37.6 C)  TempSrc: Oral Oral Oral Oral  SpO2: 98% 100% 100%   Weight:      Height:        Intake/Output Summary (Last 24 hours) at 02/21/16 1013 Last data filed at 02/21/16 0711  Gross per 24 hour  Intake             1405 ml  Output              975 ml  Net              430 ml   Filed Weights   02/16/16 0353  Weight: 67.6 kg (149 lb)    Exam: General exam: In no acute distress. Respiratory system: Good air movement and Crackles in the right lower lobe. Cardiovascular system: S1 & S2 heard, RRR.  Gastrointestinal system: Abdomen is nondistended, soft and nontender.  Extremities: No pedal edema. Skin: No rashes, lesions or ulcers Psychiatry: Judgement and insight appear normal. Mood & affect appropriate.    Data Reviewed:    Labs: Basic Metabolic Panel:  Recent Labs Lab 02/17/16 0340 02/18/16 0354 02/19/16 0525 02/20/16 0425 02/21/16 0339  NA 144 149* 151* 147* 146*  K 3.0* 3.2* 3.4* 3.1* 3.0*  CL 113* 115* 116* 113* 113*  CO2 25 25 23 25 25   GLUCOSE 142* 129* 149* 261* 104*  BUN 78* 79* 76* 63* 57*  CREATININE 3.24* 2.94* 2.97* 2.70* 2.40*  CALCIUM 7.6* 8.2* 8.4* 8.0* 8.0*  MG  --   --   --   --  1.2*   GFR Estimated Creatinine Clearance: 25.8 mL/min (by C-G formula based on SCr of 2.4 mg/dL (H)). Liver Function Tests: No results for input(s): AST, ALT, ALKPHOS, BILITOT, PROT, ALBUMIN in the last 168 hours. No results for input(s): LIPASE, AMYLASE in the last 168 hours. No results for input(s): AMMONIA in the last 168  hours. Coagulation profile  Recent Labs Lab 02/15/16 2011  INR 1.03    CBC:  Recent Labs Lab 02/15/16 2011 02/16/16 0529 02/17/16 0340 02/20/16 0425 02/21/16 0339  WBC 14.9* 15.5* 13.9* 12.8* 15.1*  NEUTROABS 12.8*  --   --   --   --   HGB 7.1* 9.5* 9.9* 8.5* 9.0*  HCT 21.3* 28.2* 29.4* 25.9* 27.8*  MCV 84.5 84.7 85.5 86.9 88.5  PLT 535* 458* 448* 420* 409*   Cardiac Enzymes: No results for input(s): CKTOTAL, CKMB, CKMBINDEX, TROPONINI in the last 168 hours. BNP (last 3 results) No results for input(s): PROBNP in the last 8760 hours. CBG:  Recent Labs Lab 02/20/16 1139 02/20/16 1653 02/20/16 2216 02/20/16 2332 02/21/16 0754  GLUCAP 308* 85 49* 96 113*   D-Dimer: No results for input(s): DDIMER  in the last 72 hours. Hgb A1c: No results for input(s): HGBA1C in the last 72 hours. Lipid Profile: No results for input(s): CHOL, HDL, LDLCALC, TRIG, CHOLHDL, LDLDIRECT in the last 72 hours. Thyroid function studies: No results for input(s): TSH, T4TOTAL, T3FREE, THYROIDAB in the last 72 hours.  Invalid input(s): FREET3 Anemia work up: No results for input(s): VITAMINB12, FOLATE, FERRITIN, TIBC, IRON, RETICCTPCT in the last 72 hours. Sepsis Labs:  Recent Labs Lab 02/16/16 0529 02/17/16 0340 02/20/16 0425 02/21/16 0339  WBC 15.5* 13.9* 12.8* 15.1*   Microbiology Recent Results (from the past 240 hour(s))  MRSA PCR Screening     Status: None   Collection Time: 02/16/16  4:54 AM  Result Value Ref Range Status   MRSA by PCR NEGATIVE NEGATIVE Final    Comment:        The GeneXpert MRSA Assay (FDA approved for NASAL specimens only), is one component of a comprehensive MRSA colonization surveillance program. It is not intended to diagnose MRSA infection nor to guide or monitor treatment for MRSA infections.      Medications:   . atenolol  25 mg Oral Daily  . atorvastatin  10 mg Oral QPC supper  . feeding supplement (ENSURE ENLIVE)  237 mL Oral BID  BM  . ferrous sulfate  325 mg Oral Q breakfast  . free water  200 mL Oral Q6H  . furosemide  40 mg Oral Daily  . insulin aspart  0-5 Units Subcutaneous QHS  . insulin aspart  0-9 Units Subcutaneous TID WC  . insulin glargine  5 Units Subcutaneous BID  . pantoprazole  40 mg Oral BID AC  . potassium chloride (KCL MULTIRUN) 30 mEq in 265 mL IVPB  30 mEq Intravenous Once  . QUEtiapine  25 mg Oral QHS   Continuous Infusions: . dextrose 5 % 1,000 mL with potassium chloride 40 mEq infusion 50 mL/hr at 02/21/16 0711    Time spent: 25 min   LOS: 5 days   [name]  [facility] Pager [phone]  *Please refer to [email or web address], password [email or web address] to get updated schedule on who will round on this patient, as hospitalists switch teams weekly. If 7PM-7AM, please contact night-coverage at [email or web address], password [email or web address] for any overnight needs.  02/21/2016, 10:13 AM

## 2016-02-21 NOTE — Clinical Social Work Note (Signed)
Patient admitted from [facility] and plans to return at discharge.   Patient will need evaluation from PT before returning to facility.   MSW will continue to follow patient and pt's family for continued support and to facilitate discharge needs once stable.   Glendon Axe, MSW [phone] 02/21/2016 11:49 AM

## 2016-02-21 NOTE — Consult Note (Signed)
   [facility] CM Inpatient Consult   02/21/2016  [name] Feb 12, 1941 [id number]      Patient screened for potential [facility] Care Management services. Chart reviewed. Noted current discharge plan is to return to SNF.  There are no identifiable [facility] Care Management needs at this time. If patient's post hospital needs change, please place a [facility] Care Management consult. For questions please contact:  Marthenia Rolling, [place], RN,BSN [facility] Liaison [phone]

## 2016-02-21 NOTE — Op Note (Signed)
[facility] Patient Name: [name] Procedure Date: 02/21/2016 MRN: [id number] Attending MD: Jerene Bears , MD Date of Birth: September 16, 1940 CSN: [id number] Age: 75 Admit Type: Inpatient Procedure:                Upper GI endoscopy Indications:              Dysphagia, For management of achalasia with planned                            Botox injection to LES Providers:                Lajuan Lines. Hilarie Fredrickson, MD, Dustin Flock RN, RN, Elspeth Cho Tech., Technician, Christell Faith, CRNA Referring MD:             [facility] Medicines:                General Anesthesia Complications:            Hypotension after induction with general                            anesthesia. Treated by anesthesia during the case. Estimated Blood Loss:     Estimated blood loss: none. Procedure:                Pre-Anesthesia Assessment:                           - Prior to the procedure, a History and Physical                            was performed, and patient medications and                            allergies were reviewed. The patient's tolerance of                            previous anesthesia was also reviewed. The risks                            and benefits of the procedure and the sedation                            options and risks were discussed with the patient.                            All questions were answered, and informed consent                            was obtained. Prior Anticoagulants: The patient has                            taken no previous anticoagulant or antiplatelet  agents. ASA Grade Assessment: III - A patient with                            severe systemic disease. After reviewing the risks                            and benefits, the patient was deemed in                            satisfactory condition to undergo the procedure.                           After obtaining informed consent, the  endoscope was                            passed under direct vision. Throughout the                            procedure, the patient's blood pressure, pulse, and                            oxygen saturations were monitored continuously. The                            [id number] scope was introduced through the                            mouth, and advanced to the second part of duodenum.                            The upper GI endoscopy was accomplished without                            difficulty. The patient tolerated the procedure                            poorly due to the patient's cardiovascular                            instability (hypotension). Scope In: Scope Out: Findings:      The lumen of the esophagus was severely dilated with pooled liquid and       debris/fragments from previously administered medication/pills. About       500 cc of fluid was aspirated through the endoscope.      Severe esophagitis was found in the middle and lower third of the       esophagus and at the GE junction (GE junction located around 42 cm). The       GE junction was biopsied very recently with reactive       changes/inflammation.      Patchy moderate inflammation characterized by congestion (edema),       erythema and granularity was found in the cardia, in the gastric fundus       and in the gastric body.      The examined duodenum was normal.  Impression:               - Achalasia esophagus. Dilation in the entire                            esophagus with stasis and pooled fluid with pill                            fragments.                           - Severe esophagitis due to achalasia (reflux,                            stasis and probable fungal).                           - Gastritis.                           - Normal examined duodenum.                           - Botox to LES was planned, but decision was made                            not to administer today due to  hypotension and                            concern for developing sepsis. Moderate Sedation:      N/A Recommendation:           - Return patient to hospital ward for ongoing care.                           - NPO for now.                           - IV fluids for resuscitation. Blood cultures.                            Broad spectrum antibiotics in treatment for                            pneumonia. Fluconazole for fungal esophagitis.                           - Change medication to IV for now.                           - Can consider repeat EGD for Botox once condition                            stabilizes. If Botox fails to improve symptoms, he                            may need consideration of  PEG.                           - Post procedure medication orders were given. Procedure Code(s):        --- Professional ---                           [phone], Esophagogastroduodenoscopy, flexible,                            transoral; diagnostic, including collection of                            specimen(s) by brushing or washing, when performed                            (separate procedure) Diagnosis Code(s):        --- Professional ---                           K22.8, Other specified diseases of esophagus                           K20.9, Esophagitis, unspecified                           K44.9, Diaphragmatic hernia without obstruction or                            gangrene                           K29.70, Gastritis, unspecified, without bleeding                           R13.10, Dysphagia, unspecified                           K22.0, Achalasia of cardia CPT copyright 2016 [employer]. All rights reserved. The codes documented in this report are preliminary and upon coder review may  be revised to meet current compliance requirements. Jerene Bears, MD 02/21/2016 1:04:53 PM This report has been signed electronically. Number of Addenda: 0

## 2016-02-22 ENCOUNTER — Inpatient Hospital Stay (HOSPITAL_COMMUNITY): Payer: Medicare Other

## 2016-02-22 DIAGNOSIS — A419 Sepsis, unspecified organism: Secondary | ICD-10-CM

## 2016-02-22 LAB — GLUCOSE, CAPILLARY
GLUCOSE-CAPILLARY: 143 mg/dL — AB (ref 65–99)
GLUCOSE-CAPILLARY: 203 mg/dL — AB (ref 65–99)
GLUCOSE-CAPILLARY: 227 mg/dL — AB (ref 65–99)
GLUCOSE-CAPILLARY: 231 mg/dL — AB (ref 65–99)
GLUCOSE-CAPILLARY: 51 mg/dL — AB (ref 65–99)
GLUCOSE-CAPILLARY: 94 mg/dL (ref 65–99)
Glucose-Capillary: 70 mg/dL (ref 65–99)
Glucose-Capillary: 48 mg/dL — ABNORMAL LOW (ref 65–99)

## 2016-02-22 LAB — CBC
HEMATOCRIT: 24.8 % — AB (ref 39.0–52.0)
Hemoglobin: 8 g/dL — ABNORMAL LOW (ref 13.0–17.0)
MCH: 28.3 pg (ref 26.0–34.0)
MCHC: 32.3 g/dL (ref 30.0–36.0)
MCV: 87.6 fL (ref 78.0–100.0)
PLATELETS: 322 10*3/uL (ref 150–400)
RBC: 2.83 MIL/uL — ABNORMAL LOW (ref 4.22–5.81)
RDW: 15.6 % — AB (ref 11.5–15.5)
WBC: 8.7 10*3/uL (ref 4.0–10.5)

## 2016-02-22 LAB — BLOOD GAS, ARTERIAL
ACID-BASE DEFICIT: 1.9 mmol/L (ref 0.0–2.0)
Bicarbonate: 21.5 mmol/L (ref 20.0–28.0)
DRAWN BY: 257701
O2 SAT: 94.3 %
PATIENT TEMPERATURE: 98.6
PH ART: 7.432 (ref 7.350–7.450)
pCO2 arterial: 32.8 mmHg (ref 32.0–48.0)
pO2, Arterial: 70.3 mmHg — ABNORMAL LOW (ref 83.0–108.0)

## 2016-02-22 LAB — BASIC METABOLIC PANEL
Anion gap: 7 (ref 5–15)
BUN: 53 mg/dL — AB (ref 6–20)
CO2: 24 mmol/L (ref 22–32)
CREATININE: 2.65 mg/dL — AB (ref 0.61–1.24)
Calcium: 7.6 mg/dL — ABNORMAL LOW (ref 8.9–10.3)
Chloride: 111 mmol/L (ref 101–111)
GFR calc Af Amer: 26 mL/min — ABNORMAL LOW (ref >60)
GFR, EST NON AFRICAN AMERICAN: 22 mL/min — AB (ref >60)
GLUCOSE: 286 mg/dL — AB (ref 65–99)
POTASSIUM: 4.5 mmol/L (ref 3.5–5.1)
SODIUM: 142 mmol/L (ref 135–145)

## 2016-02-22 LAB — CREATININE, URINE, RANDOM: Creatinine, Urine: 73.21 mg/dL

## 2016-02-22 LAB — SODIUM, URINE, RANDOM

## 2016-02-22 MED ORDER — DEXTROSE 50 % IV SOLN
1.0000 | Freq: Once | INTRAVENOUS | Status: AC
  Administered 2016-02-22: 50 mL via INTRAVENOUS

## 2016-02-22 MED ORDER — ATENOLOL 25 MG PO TABS: 12.5000 mg | ORAL_TABLET | Freq: Every day | ORAL | Status: DC

## 2016-02-22 MED ORDER — INSULIN ASPART 100 UNIT/ML ~~LOC~~ SOLN: 0.0000 [IU] | SUBCUTANEOUS | Status: DC

## 2016-02-22 MED ORDER — DEXTROSE 50 % IV SOLN
INTRAVENOUS | Status: AC
  Administered 2016-02-22: 50 mL
  Filled 2016-02-22: qty 50

## 2016-02-22 MED ORDER — DEXTROSE 50 % IV SOLN
INTRAVENOUS | Status: AC
  Administered 2016-02-22: 50 mL via INTRAVENOUS
  Filled 2016-02-22: qty 50

## 2016-02-22 NOTE — Evaluation (Signed)
Physical Therapy Evaluation Patient Details Name: MOKSH SIGGINS MRN: [id number] DOB: Dec 01, 1940 Today's Date: 02/22/2016   History of Present Illness  75 yo male admitted with GI hemorrhage, achalasia. Hx of HTN, CKD, DVT, DM, prostate cancer, falls.   Clinical Impression  On eval, pt required Total assist +2 for mobility. Sat pt at EOB for ~4-5 minutes while working on static sitting balance. Noted bowel incontinence while sitting EOB. Assisted pt back to supine for hygiene, linen exchange. No family present during session. Recommend return to SNF. Will follow and progress activity as tolerated.     Follow Up Recommendations SNF    Equipment Recommendations       Recommendations for Other Services       Precautions / Restrictions Precautions Precautions: Fall      Mobility  Bed Mobility Overal bed mobility: Needs Assistance Bed Mobility: Rolling;Supine to Sit;Sit to Supine Rolling: Max assist;+2 for physical assistance;+2 for safety/equipment   Supine to sit: Total assist;+2 for physical assistance;+2 for safety/equipment;HOB elevated Sit to supine: Total assist;+2 for physical assistance;+2 for safety/equipment;HOB elevated   General bed mobility comments: Assist for trunk and LEs. Increased time. Multimodal cues for participation. Pt did not follow 1 step commands well.   Transfers                    Ambulation/Gait                Stairs            Wheelchair Mobility    Modified Rankin (Stroke Patients Only)       Balance Overall balance assessment: Needs assistance Sitting-balance support: Feet supported Sitting balance-Leahy Scale: Poor Sitting balance - Comments: Mod assist for static sitting balance. Repeated LOB without external support Postural control: Posterior lean                                   Pertinent Vitals/Pain Pain Assessment: Faces Faces Pain Scale: No hurt    Home Living Family/patient  expects to be discharged to:: Skilled nursing facility                      Prior Function Level of Independence: Needs assistance   Gait / Transfers Assistance Needed: Pt admitted from SNF. Unsure of mobility level at SNF           Hand Dominance        Extremity/Trunk Assessment   Upper Extremity Assessment: Difficult to assess due to impaired cognition           Lower Extremity Assessment: Difficult to assess due to impaired cognition      Cervical / Trunk Assessment: Normal  Communication   Communication: Expressive difficulties (pt difficult to understand; mumbles)  Cognition Arousal/Alertness: Lethargic Behavior During Therapy: WFL for tasks assessed/performed Overall Cognitive Status: No family/caregiver present to determine baseline cognitive functioning Area of Impairment: Following commands;Safety/judgement;Problem solving       Following Commands: Follows one step commands inconsistently Safety/Judgement: Decreased awareness of deficits;Decreased awareness of safety   Problem Solving: Slow processing;Requires verbal cues;Requires tactile cues      General Comments      Exercises     Assessment/Plan    PT Assessment Patient needs continued PT services  PT Problem List Decreased strength;Decreased mobility;Decreased balance;Decreased activity tolerance;Pain;Decreased knowledge of use of DME;Decreased cognition  PT Treatment Interventions Therapeutic activities;Therapeutic exercise;DME instruction;Patient/family education;Functional mobility training;Balance training    PT Goals (Current goals can be found in the Care Plan section)  Acute Rehab PT Goals Patient Stated Goal: none stated PT Goal Formulation: Patient unable to participate in goal setting Time For Goal Achievement: 03/07/16 Potential to Achieve Goals: Fair    Frequency Min 2X/week   Barriers to discharge        Co-evaluation               End of  Session   Activity Tolerance: Patient tolerated treatment well Patient left: with call bell/phone within reach;with bed alarm set           Time: [id number] PT Time Calculation (min) (ACUTE ONLY): 23 min   Charges:   PT Evaluation $PT Eval Low Complexity: 1 Procedure PT Treatments $Therapeutic Activity: 8-22 mins   PT G Codes:        Weston Anna, MPT Pager: [phone]

## 2016-02-22 NOTE — Progress Notes (Signed)
Inpatient Diabetes Program Recommendations  AACE/ADA: New Consensus Statement on Inpatient Glycemic Control (2015)  Target Ranges:  Prepandial:   less than 140 mg/dL      Peak postprandial:   less than 180 mg/dL (1-2 hours)      Critically ill patients:  140 - 180 mg/dL   Lab Results  Component Value Date   GLUCAP 203 (H) 02/22/2016   HGBA1C 7.1 (H) 12/21/2015    Review of Glycemic Control  Diabetes history: DM2 Outpatient Diabetes medications: glipizide 2.5 mg QAM, tradjenta 5 mg QAM, Actos 30 mg QD. Current orders for Inpatient glycemic control: Novolog 0-9 units tidwc and hs  Blood sugars in 200s. If FBS > 180 mg/dL, may benefit from small amount of basal insulin.    Inpatient Diabetes Program Recommendations:    Change Novolog to sensitive Q4H while NPO.  Will follow.  Thank you. Lorenda Peck, RD, LDN, CDE Inpatient Diabetes Coordinator [phone]

## 2016-02-22 NOTE — Progress Notes (Signed)
Patient ID: MINUS BRIGNER, male   DOB: April 06, 1941, 75 y.o.   MRN: [id number]    Progress Note   Subjective   Sleepy , arouses but confused, mumbling 02 sat 100   Temp 100.4 this am  Hemodynamically stable overnight CXR last pm - CHF with bilateral effusions   Objective   Vital signs in last 24 hours: Temp:  [98.3 F (36.8 C)-102.7 F (39.3 C)] 100.4 F (38 C) (11/15 0800) Pulse Rate:  [30-108] 96 (11/15 0800) Resp:  [18-43] 38 (11/15 0800) BP: (95-137)/(22-117) 105/39 (11/15 0800) SpO2:  [89 %-100 %] 97 % (11/15 0800) Weight:  [149 lb (67.6 kg)] 149 lb (67.6 kg) (11/14 1146) Last BM Date: 02/21/16 General:  Elderly AA male in NAD sleepy , mittens on- waving hands around Heart:  Regular rate and rhythm; no murmurs Lungs:  Tachypneic, shallow resp- bilateraol rhonchi, basilar rales Abdomen:  Soft, nontender and nondistended. Normal bowel sounds. Extremities:  Without edema. Neurologic:  Confused, mumbling   Intake/Output from previous day: 11/14 0701 - 11/15 0700 In: 2965.8 [I.V.:2155.8; IV Piggyback:300] Out: 101 [Urine:100; Stool:1] Intake/Output this shift: Total I/O In: 625 [I.V.:625] Out: -   Lab Results:  Recent Labs  02/20/16 0425 02/21/16 0339 02/22/16 0307  WBC 12.8* 15.1* 8.7  HGB 8.5* 9.0* 8.0*  HCT 25.9* 27.8* 24.8*  PLT 420* 409* 322   BMET  Recent Labs  02/20/16 0425 02/21/16 0339 02/22/16 0307  NA 147* 146* 142  K 3.1* 3.0* 4.5  CL 113* 113* 111  CO2 25 25 24   GLUCOSE 261* 104* 286*  BUN 63* 57* 53*  CREATININE 2.70* 2.40* 2.65*  CALCIUM 8.0* 8.0* 7.6*   LFT No results for input(s): PROT, ALBUMIN, AST, ALT, ALKPHOS, BILITOT, BILIDIR, IBILI in the last 72 hours. PT/INR No results for input(s): LABPROT, INR in the last 72 hours.  Studies/Results: Dg Chest 1 View  Result Date: 02/21/2016 CLINICAL DATA:  Hypotension. EXAM: CHEST 1 VIEW COMPARISON:  02/21/2016 FINDINGS: The heart is borderline enlarged. There is tortuosity of  the thoracic aorta. Increased interstitial markings, peribronchial thickening, bilateral pleural effusions and bibasilar atelectasis suggesting CHF. The bony thorax is intact. IMPRESSION: CHF with bilateral effusions and bibasilar atelectasis. Electronically Signed   By: Marijo Sanes M.D.   On: 02/21/2016 16:51   Dg Chest Port 1 View  Result Date: 02/21/2016 CLINICAL DATA:  Fever, nausea, and vomiting. EXAM: PORTABLE CHEST 1 VIEW COMPARISON:  Portable chest x-ray of February 02, 2016 FINDINGS: The lungs are adequately inflated. There is patchy increased density at the left lung base. The heart border remain sharp. A trace of pleural fluid blunts the left costophrenic angle. The heart and pulmonary vascularity is normal. There is prominence of the aortic arch and proximal descending thoracic aorta which is stable. The mediastinum is normal in width. The bony thorax exhibits no acute abnormality. IMPRESSION: Slight interval increase in left lower lobe atelectasis or pneumonia. Reportedly there is a large hiatal hernia and a portion of the density may be related to this. No CHF. Trace left pleural effusion. Electronically Signed   By: David  Martinique M.D.   On: 02/21/2016 07:20       Assessment / Plan:    #1 75 yo AA male with dementia, metastatic prostate CA , AODM, CKD- admitted with anemia, heme + stool EGD 11/10 - dilated fluid filled esophagus consistent with achalasia- severe distal esophagitis and ? neoplastic changes- BX shows acute reactive inflammation- no malignancy EGD 11/14 - severe  esophagitis, middle and lower esophagus , fluid filled - Botox was not done as pt was febrile , and became hypotensive  during EGD  #2 CHF /pneumonia?-- management per Hospitalist   Plan; No further GI intervention planned  Until cardiopulmonary status much improved  Continue NPO IV PPI BID Await brushings?fungal component to esophagitis   Principal Problem:   Gastrointestinal hemorrhage Active  Problems:   Essential hypertension   PROSTATE CANCER, HX OF   Anemia   Acute blood loss anemia   Acute esophagitis   Food impaction of esophagus   Achalasia of esophagus   Pressure injury of skin   Hypernatremia   Heme positive stool   SIRS (systemic inflammatory response syndrome) (HCC)     LOS: 6 days   Montford Barg  02/22/2016, 10:18 AM

## 2016-02-22 NOTE — Progress Notes (Signed)
[name]   Progress Note  [name]  [id number] DOB: 09-10-1940 DOA: 02/15/2016 PCP: [name], FNP   Brief Narrative:   AGEE SERAFINI is an 75 y.o. male with history of metastatic prostate cancer, HTN, T2DM, CKD with chronic anemia sent to the ER for hemoglobin of 7 with melanotic stools. FOBT+. Evaluation with EGD 11/10 showed dilated esophagus consistent with achalasia, biopsy showed reactive inflammation without malignancy.Repeat EGD 11/14 again showed severe esophagitis, though botox was not administered due to intraprocedural hypotension and fever concerning for SIRS/sepsis.   Assessment/Plan:   Sepsis due to aspiration pneumonia/pneumonitis: Recently treated for HCAP 10/21 - 10/27 based on LLL infiltrate at that time.  - Vanc/zosyn started 11/14 empirically for worsening SIRS with hypotension, and transferred to SDU following EGD.  - Blood cultures 11/14 pending - No further IVF with worsening effusions on CXR - Repeat CXR this AM - Check cortisol due to persistent hypotension and recent h/o relative adrenal insufficiency.   - ABG good with pH 7.43, pCO2 32.8, pO2 70.3  Bilateral pleural effusions and atelectasis: Suggestive of CHF. Pt is on lasix daily at home but no Dx CHF.  - Stop IVF - Hold lasix while hypotensive - Check echocardiogram  Acute GI bleed: Due to severe esophagitis related to achalasia seen on EGD. No botox 11/14 due to concern for sepsis.  - Biopsy 11/10 showed acute reactive inflammation, no malignancy detected. - Continue NPO - Protonix IV BID - Follow up GI recommendations  Essential hypertension: Hypotensive currently. Hold antihypertensives.  - Monitor hypotension.   History of castration-resistant prostate CA: Followed by Dr. Alen Blew - Hold zytiga - Added Dr. Alen Blew to treatment team. Outpatient follow up was scheduled 11/22  Normocytic, normochromic anemia: Acute on chronic due to esophagitis. Hgb 7 > 9.5  s/p 2u PRBCs. - Hbg stable. Continue to monitor - Holding po iron while NPO  Recently diagnosed chronic RLE DVT: - Awaiting GI recommendations to start anticoagulation. - Continue to hold lovenox.  CKD stage III: Suspect 2.5-2.7 is baseline, initially with AKI (Cr 3.65 at admission) - Hold nephrotoxins - Monitor renal function daily  Hypokalemia: Resolved s/p repletion with magnesium and potassium. - Continue cautious repletion as needed with AKI on CKD.  Hypernatremia: Resolved.   T2DM: HbA1c 7.1% in Sept 2017.  - Hold oral medications - Check CBGs q4h while NPO  Altered mental status: 02/21/2016 following start of seroquel for nocturnal delirium.  - Hold seroquel and minimize haldol as needed for patient safety/agitation.  - Treat sepsis  DVT prophylaxis: SCDs Family Communication: None at bedside this AM Disposition Plan/Barrier to D/C: D/C to SNF following IV treatment of aspiration pneumonia. GI intervention following resuscitation. Will need PT reevaluation. Code Status: Full  IV Access:    Peripheral IV  Procedures and diagnostic studies:   Dg Chest 1 View  Result Date: 02/21/2016 CLINICAL DATA:  Hypotension. EXAM: CHEST 1 VIEW COMPARISON:  02/21/2016 FINDINGS: The heart is borderline enlarged. There is tortuosity of the thoracic aorta. Increased interstitial markings, peribronchial thickening, bilateral pleural effusions and bibasilar atelectasis suggesting CHF. The bony thorax is intact. IMPRESSION: CHF with bilateral effusions and bibasilar atelectasis. Electronically Signed   By: Marijo Sanes M.D.   On: 02/21/2016 16:51   Dg Chest Port 1 View  Result Date: 02/21/2016 CLINICAL DATA:  Fever, nausea, and vomiting. EXAM: PORTABLE CHEST 1 VIEW COMPARISON:  Portable chest x-ray of February 02, 2016 FINDINGS: The lungs are adequately inflated. There is  patchy increased density at the left lung base. The heart border remain sharp. A trace of pleural fluid blunts the  left costophrenic angle. The heart and pulmonary vascularity is normal. There is prominence of the aortic arch and proximal descending thoracic aorta which is stable. The mediastinum is normal in width. The bony thorax exhibits no acute abnormality. IMPRESSION: Slight interval increase in left lower lobe atelectasis or pneumonia. Reportedly there is a large hiatal hernia and a portion of the density may be related to this. No CHF. Trace left pleural effusion. Electronically Signed   By: David  Martinique M.D.   On: 02/21/2016 07:20     Medical Consultants:    Gastroenterology  Anti-Infectives:   Vancomycin 11/14 >>  Zosyn 11/14 >>   Subjective:   Mr. Shalhoub is more somnolent today, poorly responsive to commands, not answering questions appropriately or cooperating with exam.   Objective:    Vitals:   02/22/16 1130 02/22/16 1200 02/22/16 1300 02/22/16 1400  BP:  (!) 111/43 (!) 111/58 (!) 102/40  Pulse: 94 91 88 88  Resp: (!) 30 (!) 37 (!) 29 (!) 27  Temp: 99.2 F (37.3 C)     TempSrc: Oral     SpO2: 100% 95% 99% 100%  Weight:      Height:        Intake/Output Summary (Last 24 hours) at 02/22/16 1422 Last data filed at 02/22/16 1100  Gross per 24 hour  Intake          2840.83 ml  Output              101 ml  Net          2739.83 ml   Filed Weights   02/16/16 0353 02/21/16 1146  Weight: 67.6 kg (149 lb) 67.6 kg (149 lb)    Exam: General exam: Elderly male in no distress laying alone with posey mittens on. Respiratory system: Right MAL +crackles, left MAL decreased breath sounds. Nonlabored. Posterior exam not completed due to patient unable to cooperate. Cardiovascular system: Tachycardic, regular, no murmur. +JVD. Trace pedal edema.  Gastrointestinal system: Abdomen is nondistended, soft and nontender.  Extremities: No deformities Skin: Stage II partial thickness open ulcer with healthy wound bed. Minimal surrounding erythema.  Psychiatry: Somnolent, poorly responsive to  commands.    Data Reviewed:    Labs: Basic Metabolic Panel:  Recent Labs Lab 02/18/16 0354 02/19/16 0525 02/20/16 0425 02/21/16 0339 02/22/16 0307  NA 149* 151* 147* 146* 142  K 3.2* 3.4* 3.1* 3.0* 4.5  CL 115* 116* 113* 113* 111  CO2 25 23 25 25 24   GLUCOSE 129* 149* 261* 104* 286*  BUN 79* 76* 63* 57* 53*  CREATININE 2.94* 2.97* 2.70* 2.40* 2.65*  CALCIUM 8.2* 8.4* 8.0* 8.0* 7.6*  MG  --   --   --  1.2*  --    GFR Estimated Creatinine Clearance: 23.4 mL/min (by C-G formula based on SCr of 2.65 mg/dL (H)). Liver Function Tests: No results for input(s): AST, ALT, ALKPHOS, BILITOT, PROT, ALBUMIN in the last 168 hours. No results for input(s): LIPASE, AMYLASE in the last 168 hours. No results for input(s): AMMONIA in the last 168 hours. Coagulation profile  Recent Labs Lab 02/15/16 2011  INR 1.03    CBC:  Recent Labs Lab 02/15/16 2011 02/16/16 0529 02/17/16 0340 02/20/16 0425 02/21/16 0339 02/22/16 0307  WBC 14.9* 15.5* 13.9* 12.8* 15.1* 8.7  NEUTROABS 12.8*  --   --   --   --   --  HGB 7.1* 9.5* 9.9* 8.5* 9.0* 8.0*  HCT 21.3* 28.2* 29.4* 25.9* 27.8* 24.8*  MCV 84.5 84.7 85.5 86.9 88.5 87.6  PLT 535* 458* 448* 420* 409* 322   Cardiac Enzymes: No results for input(s): CKTOTAL, CKMB, CKMBINDEX, TROPONINI in the last 168 hours. BNP (last 3 results) No results for input(s): PROBNP in the last 8760 hours. CBG:  Recent Labs Lab 02/21/16 1803 02/21/16 2159 02/22/16 0822 02/22/16 1013 02/22/16 1140  GLUCAP 169* 195* 227* 231* 203*   D-Dimer: No results for input(s): DDIMER in the last 72 hours. Hgb A1c: No results for input(s): HGBA1C in the last 72 hours. Lipid Profile: No results for input(s): CHOL, HDL, LDLCALC, TRIG, CHOLHDL, LDLDIRECT in the last 72 hours. Thyroid function studies: No results for input(s): TSH, T4TOTAL, T3FREE, THYROIDAB in the last 72 hours.  Invalid input(s): FREET3 Anemia work up: No results for input(s): VITAMINB12,  FOLATE, FERRITIN, TIBC, IRON, RETICCTPCT in the last 72 hours. Sepsis Labs:  Recent Labs Lab 02/17/16 0340 02/20/16 0425 02/21/16 0339 02/22/16 0307  WBC 13.9* 12.8* 15.1* 8.7   Microbiology Recent Results (from the past 240 hour(s))  MRSA PCR Screening     Status: None   Collection Time: 02/16/16  4:54 AM  Result Value Ref Range Status   MRSA by PCR NEGATIVE NEGATIVE Final    Comment:        The GeneXpert MRSA Assay (FDA approved for NASAL specimens only), is one component of a comprehensive MRSA colonization surveillance program. It is not intended to diagnose MRSA infection nor to guide or monitor treatment for MRSA infections.    Medications:   . atenolol  25 mg Oral Daily  . feeding supplement (ENSURE ENLIVE)  237 mL Oral BID BM  . free water  200 mL Oral Q6H  . insulin aspart  0-5 Units Subcutaneous QHS  . insulin aspart  0-9 Units Subcutaneous TID WC  . pantoprazole (PROTONIX) IV  40 mg Intravenous Q12H  . piperacillin-tazobactam (ZOSYN)  IV  3.375 g Intravenous Q8H  . QUEtiapine  25 mg Oral QHS  . vancomycin  750 mg Intravenous Q24H   Continuous Infusions:  Time spent: 25 min   LOS: 6 days   Vance Gather, MD  [facility] Pager [phone]  *Please refer to [email or web address], password [email or web address] to get updated schedule on who will round on this patient, as hospitalists switch teams weekly. If 7PM-7AM, please contact night-coverage at [email or web address], password [email or web address] for any overnight needs.  02/22/2016, 2:22 PM

## 2016-02-23 ENCOUNTER — Encounter (HOSPITAL_COMMUNITY): Payer: Self-pay | Admitting: Internal Medicine

## 2016-02-23 ENCOUNTER — Inpatient Hospital Stay (HOSPITAL_COMMUNITY): Payer: Medicare Other

## 2016-02-23 DIAGNOSIS — I509 Heart failure, unspecified: Secondary | ICD-10-CM

## 2016-02-23 LAB — GLUCOSE, CAPILLARY
GLUCOSE-CAPILLARY: 61 mg/dL — AB (ref 65–99)
GLUCOSE-CAPILLARY: 89 mg/dL (ref 65–99)
Glucose-Capillary: 102 mg/dL — ABNORMAL HIGH (ref 65–99)
Glucose-Capillary: 109 mg/dL — ABNORMAL HIGH (ref 65–99)
Glucose-Capillary: 159 mg/dL — ABNORMAL HIGH (ref 65–99)
Glucose-Capillary: 162 mg/dL — ABNORMAL HIGH (ref 65–99)
Glucose-Capillary: 79 mg/dL (ref 65–99)

## 2016-02-23 LAB — BASIC METABOLIC PANEL
Anion gap: 8 (ref 5–15)
BUN: 50 mg/dL — ABNORMAL HIGH (ref 6–20)
CHLORIDE: 112 mmol/L — AB (ref 101–111)
CO2: 24 mmol/L (ref 22–32)
Calcium: 7.9 mg/dL — ABNORMAL LOW (ref 8.9–10.3)
Creatinine, Ser: 2.74 mg/dL — ABNORMAL HIGH (ref 0.61–1.24)
GFR calc Af Amer: 25 mL/min — ABNORMAL LOW (ref >60)
GFR calc non Af Amer: 21 mL/min — ABNORMAL LOW (ref >60)
Glucose, Bld: 85 mg/dL (ref 65–99)
POTASSIUM: 4 mmol/L (ref 3.5–5.1)
SODIUM: 144 mmol/L (ref 135–145)

## 2016-02-23 LAB — HEMOGLOBIN AND HEMATOCRIT, BLOOD
HCT: 25.3 % — ABNORMAL LOW (ref 39.0–52.0)
HEMOGLOBIN: 8.3 g/dL — AB (ref 13.0–17.0)

## 2016-02-23 LAB — CORTISOL-AM, BLOOD: Cortisol - AM: 3.9 ug/dL — ABNORMAL LOW (ref 6.7–22.6)

## 2016-02-23 LAB — ECHOCARDIOGRAM COMPLETE
Height: 68.5 in
Weight: 2299.84 oz

## 2016-02-23 LAB — CBC
HEMATOCRIT: 23.7 % — AB (ref 39.0–52.0)
HEMOGLOBIN: 7.6 g/dL — AB (ref 13.0–17.0)
MCH: 28.6 pg (ref 26.0–34.0)
MCHC: 32.1 g/dL (ref 30.0–36.0)
MCV: 89.1 fL (ref 78.0–100.0)
Platelets: 308 10*3/uL (ref 150–400)
RBC: 2.66 MIL/uL — ABNORMAL LOW (ref 4.22–5.81)
RDW: 16 % — ABNORMAL HIGH (ref 11.5–15.5)
WBC: 10.8 10*3/uL — AB (ref 4.0–10.5)

## 2016-02-23 LAB — PREPARE RBC (CROSSMATCH)

## 2016-02-23 MED ORDER — METHYLPREDNISOLONE SODIUM SUCC 125 MG IJ SOLR
80.0000 mg | Freq: Two times a day (BID) | INTRAMUSCULAR | Status: DC
  Administered 2016-02-23 – 2016-02-24 (×4): 80 mg via INTRAVENOUS
  Filled 2016-02-23 (×4): qty 2

## 2016-02-23 MED ORDER — DEXTROSE-NACL 5-0.45 % IV SOLN
INTRAVENOUS | Status: DC
  Administered 2016-02-23 – 2016-02-24 (×2): via INTRAVENOUS

## 2016-02-23 MED ORDER — FUROSEMIDE 10 MG/ML IJ SOLN
20.0000 mg | Freq: Every day | INTRAMUSCULAR | Status: DC
  Administered 2016-02-23 – 2016-02-24 (×2): 20 mg via INTRAVENOUS
  Filled 2016-02-23 (×2): qty 2

## 2016-02-23 MED ORDER — SODIUM CHLORIDE 0.9 % IV SOLN
Freq: Once | INTRAVENOUS | Status: AC
  Administered 2016-02-23: 08:00:00 via INTRAVENOUS

## 2016-02-23 MED ORDER — DEXTROSE 50 % IV SOLN
INTRAVENOUS | Status: AC
  Administered 2016-02-23: 25 mL
  Filled 2016-02-23: qty 50

## 2016-02-23 MED ORDER — FLUCONAZOLE IN SODIUM CHLORIDE 200-0.9 MG/100ML-% IV SOLN
200.0000 mg | INTRAVENOUS | Status: DC
  Administered 2016-02-23 – 2016-02-29 (×6): 200 mg via INTRAVENOUS
  Filled 2016-02-23 (×7): qty 100

## 2016-02-23 MED ORDER — INSULIN ASPART 100 UNIT/ML ~~LOC~~ SOLN
0.0000 [IU] | SUBCUTANEOUS | Status: DC
  Administered 2016-02-23 – 2016-02-24 (×3): 2 [IU] via SUBCUTANEOUS

## 2016-02-23 MED ORDER — LIP MEDEX EX OINT
TOPICAL_OINTMENT | CUTANEOUS | Status: AC
  Administered 2016-02-23: 12:00:00
  Filled 2016-02-23: qty 7

## 2016-02-23 NOTE — Progress Notes (Addendum)
Hypoglycemic Event  CBG: 48  Treatment:  Amp D50  Symptoms: none  Follow-up CBG: Time:2100 CBG Result:143  Possible Reasons for Event: npo  Comments/MD notified: pt is not able to safely swallow, will notify [facility]    Dyann Ruddle

## 2016-02-23 NOTE — Progress Notes (Signed)
[name]   Progress Note  [name]  [id number] DOB: [birth year] DOA: 02/15/2016 PCP: Kevin Arnold, [name]   Brief Narrative:   [name] is an 75 y.o. male with history of metastatic prostate cancer, HTN, T2DM, CKD with chronic anemia sent to the ER for hemoglobin of 7 with melanotic stools. FOBT+. Evaluation with EGD 11/10 showed dilated esophagus consistent with achalasia, biopsy showed reactive inflammation without malignancy. Repeat EGD 11/14 again showed severe esophagitis, though botox was not administered due to intraprocedural hypotension and fever concerning for SIRS/sepsis.He was transferred to SDU, started on broad spectrum antibiotics which have been narrowed to zosyn. Fluconazole was empirically started for evidence of candidal esophagitis on EGD. Cortisol checked the next day was 3.9 and stress-steroids were started.   Assessment/Plan:   SIRS due to aspiration pneumonia/pneumonitis vs. adrenal insufficiency: Recently treated for HCAP 10/21 - 10/27 based on LLL infiltrate at that time.  - Vanc/zosyn started 11/14 empirically for worsening SIRS with hypotension, and transferred to SDU following EGD. D/C vanc as MRSA swab negative and suspicion is for aspiration event. - Blood cultures 11/14 pending: Consider D/C zosyn if these remain negative.  - No further IVF with worsening effusions on CXR - Stress steroids today with quick taper due to h/o admission for steroid-induced hyperglycemia.   Candida esophagitis: No esophageal fungal cultures obtained.  - Empiric Tx recommended by GI.  - Fluconazole 11/16 >> 11/29  Hypoglycemia: Mild, recurrent, without hypoglycemic administration concerning for adrenal insufficiency confirmed by AM cortisol 11/16.  - Start steroids and monitor CBG q4h while NPO. Anticipate hyperglycemia, so will also start sensitive SSI.   Bilateral pleural effusions and atelectasis: Suggestive of CHF. Pt is on lasix daily  at home but no Dx CHF. Echo 11/16 shows mild LVH, G2DD and IVC appearance suggestive of normal CVP.  - Maintenance IVF while NPO - Continue home lasix with fluids  Acute GI bleed: Due to severe esophagitis related to achalasia seen on EGD. No botox 11/14 due to concern for sepsis.  - Biopsy 11/10 showed acute reactive inflammation, no malignancy detected. - Continue NPO - Protonix IV BID - Follow up GI recommendations  Essential hypertension: Hypotensive currently. Hold antihypertensives.  - Monitor hypotension.   History of castration-resistant prostate CA: Followed by [name] - Hold zytiga - Added [name] to treatment team. Outpatient follow up was scheduled 11/22  Normocytic, normochromic anemia: Acute on chronic due to esophagitis. Hgb 7 > 9.5 s/p 2u PRBCs dropped recurrently 11/16 7.6 > 8.3 s/p 1u PRBCs. - Continue to monitor - Holding po iron while NPO  Recently diagnosed chronic RLE DVT: - Actively bleeding now, will awaiting GI recommendations to restart anticoagulation. - Continue to hold lovenox.  CKD stage III: Suspect 2.5-2.7 is baseline, initially with AKI (Cr 3.65 at admission), has since returned to baseline.  - Hold nephrotoxins - Monitor renal function daily  Hypokalemia: Resolved s/p repletion with magnesium and potassium. - Continue cautious repletion as needed with AKI on CKD.  Hypernatremia: Resolved.   T2DM: HbA1c 7.1% in Sept 2017.  - Hold oral medications - Check CBGs q4h while NPO  Altered mental status: 02/21/2016 following start of seroquel for nocturnal delirium.  - Hold seroquel and minimize haldol as needed for patient safety/agitation.  - Treat SIRS  DVT prophylaxis: SCDs Family Communication: Nephew at bedside Disposition Plan/Barrier to D/C: D/C to SNF following IV treatment of aspiration pneumonia. GI intervention following resuscitation. Will need PT reevaluation. Code  Status: Full  IV Access:    Peripheral IV  Procedures  and diagnostic studies:   Dg Chest 1 View  Result Date: 02/21/2016 CLINICAL DATA:  Hypotension. EXAM: CHEST 1 VIEW COMPARISON:  02/21/2016 FINDINGS: The heart is borderline enlarged. There is tortuosity of the thoracic aorta. Increased interstitial markings, peribronchial thickening, bilateral pleural effusions and bibasilar atelectasis suggesting CHF. The bony thorax is intact. IMPRESSION: CHF with bilateral effusions and bibasilar atelectasis. Electronically Signed   By: Marijo Sanes M.D.   On: 02/21/2016 16:51   Dg Chest Port 1 View  Result Date: 02/22/2016 CLINICAL DATA:  CHF, sepsis EXAM: PORTABLE CHEST 1 VIEW COMPARISON:  02/21/2016 FINDINGS: There is mild bilateral interstitial thickening. There is no focal parenchymal opacity. There is no pleural effusion or pneumothorax. The heart and mediastinal contours are unremarkable. The osseous structures are unremarkable. IMPRESSION: Findings most consistent with mild interstitial edema. Electronically Signed   By: Kathreen Devoid   On: 02/22/2016 16:21   Medical Consultants:    Gastroenterology, [name]  Anti-Infectives:   Vancomycin 11/14 >>  Zosyn 11/14 >>  Diflucan 11/15 >>   Subjective:   [name] is more responsive, denying dyspnea, chest pain, palpitations, abd pain, N/V. Just feels overall "nasty."   Objective:    Vitals:   02/23/16 1240 02/23/16 1358 02/23/16 1400 02/23/16 1500  BP:  106/74 100/67 (!) 120/54  Pulse:  90 91 88  Resp:  (!) 26 (!) 27 (!) 24  Temp: 98.7 F (37.1 C) 98.5 F (36.9 C)    TempSrc: Oral Oral    SpO2:  100% 100% 100%  Weight:      Height:        Intake/Output Summary (Last 24 hours) at 02/23/16 1531 Last data filed at 02/23/16 1500  Gross per 24 hour  Intake           883.75 ml  Output              850 ml  Net            33.75 ml   Filed Weights   02/16/16 0353 02/21/16 1146 02/23/16 0000  Weight: 67.6 kg (149 lb) 67.6 kg (149 lb) 65.2 kg (143 lb 11.8 oz)   Exam: General  exam: Elderly male in no distress laying alone with posey mittens on. Respiratory system: Nonlabored, minimal bibasilar rales and rhonchi. Cardiovascular system: RRR, no murmur. No JVD. Trace pedal edema R > L.  Gastrointestinal system: Abdomen is nondistended, soft and nontender.  Extremities: No deformities Skin: Stage II partial thickness open ulcer with healthy wound bed. Minimal surrounding erythema.  Psychiatry: More alert and responsive today with minimal verbal output.  Data Reviewed:    Labs: Basic Metabolic Panel:  Recent Labs Lab 02/19/16 0525 02/20/16 0425 02/21/16 0339 02/22/16 0307 02/23/16 0312  NA 151* 147* 146* 142 144  K 3.4* 3.1* 3.0* 4.5 4.0  CL 116* 113* 113* 111 112*  CO2 23 25 25 24 24   GLUCOSE 149* 261* 104* 286* 85  BUN 76* 63* 57* 53* 50*  CREATININE 2.97* 2.70* 2.40* 2.65* 2.74*  CALCIUM 8.4* 8.0* 8.0* 7.6* 7.9*  MG  --   --  1.2*  --   --    GFR Estimated Creatinine Clearance: 21.8 mL/min (by C-G formula based on SCr of 2.74 mg/dL (H)). Liver Function Tests: No results for input(s): AST, ALT, ALKPHOS, BILITOT, PROT, ALBUMIN in the last 168 hours. No results for input(s): LIPASE, AMYLASE in the  last 168 hours. No results for input(s): AMMONIA in the last 168 hours. Coagulation profile No results for input(s): INR, PROTIME in the last 168 hours.  CBC:  Recent Labs Lab 02/17/16 0340 02/20/16 0425 02/21/16 0339 02/22/16 0307 02/23/16 0312  WBC 13.9* 12.8* 15.1* 8.7 10.8*  HGB 9.9* 8.5* 9.0* 8.0* 7.6*  HCT 29.4* 25.9* 27.8* 24.8* 23.7*  MCV 85.5 86.9 88.5 87.6 89.1  PLT 448* 420* 409* 322 308   Cardiac Enzymes: No results for input(s): CKTOTAL, CKMB, CKMBINDEX, TROPONINI in the last 168 hours. BNP (last 3 results) No results for input(s): PROBNP in the last 8760 hours. CBG:  Recent Labs Lab 02/22/16 2336 02/23/16 0402 02/23/16 0809 02/23/16 0856 02/23/16 1240  GLUCAP 94 79 61* 109* 89   D-Dimer: No results for input(s):  DDIMER in the last 72 hours. Hgb A1c: No results for input(s): HGBA1C in the last 72 hours. Lipid Profile: No results for input(s): CHOL, HDL, LDLCALC, TRIG, CHOLHDL, LDLDIRECT in the last 72 hours. Thyroid function studies: No results for input(s): TSH, T4TOTAL, T3FREE, THYROIDAB in the last 72 hours.  Invalid input(s): FREET3 Anemia work up: No results for input(s): VITAMINB12, FOLATE, FERRITIN, TIBC, IRON, RETICCTPCT in the last 72 hours. Sepsis Labs:  Recent Labs Lab 02/20/16 0425 02/21/16 0339 02/22/16 0307 02/23/16 0312  WBC 12.8* 15.1* 8.7 10.8*   Microbiology Recent Results (from the past 240 hour(s))  MRSA PCR Screening     Status: None   Collection Time: 02/16/16  4:54 AM  Result Value Ref Range Status   MRSA by PCR NEGATIVE NEGATIVE Final    Comment:        The GeneXpert MRSA Assay (FDA approved for NASAL specimens only), is one component of a comprehensive MRSA colonization surveillance program. It is not intended to diagnose MRSA infection nor to guide or monitor treatment for MRSA infections.   Culture, blood (routine x 2)     Status: None (Preliminary result)   Collection Time: 02/21/16  6:50 AM  Result Value Ref Range Status   Specimen Description BLOOD RIGHT ARM  Final   Special Requests BOTTLES DRAWN AEROBIC ONLY 5CC  Final   Culture   Final    NO GROWTH 1 DAY Performed at [facility]    Report Status PENDING  Incomplete  Culture, blood (routine x 2)     Status: None (Preliminary result)   Collection Time: 02/21/16  6:51 AM  Result Value Ref Range Status   Specimen Description BLOOD RIGHT HAND  Final   Special Requests BOTTLES DRAWN AEROBIC AND ANAEROBIC 5CC  Final   Culture   Final    NO GROWTH 1 DAY Performed at [facility]    Report Status PENDING  Incomplete   Medications:   . feeding supplement (ENSURE ENLIVE)  237 mL Oral BID BM  . fluconazole (DIFLUCAN) IV  200 mg Intravenous Q24H  . furosemide  20 mg Intravenous  Daily  . methylPREDNISolone (SOLU-MEDROL) injection  80 mg Intravenous Q12H  . pantoprazole (PROTONIX) IV  40 mg Intravenous Q12H  . piperacillin-tazobactam (ZOSYN)  IV  3.375 g Intravenous Q8H   Continuous Infusions: . dextrose 5 % and 0.45% NaCl 75 mL/hr at 02/23/16 1500   Time spent: 25 min   LOS: 7 days   Vance Gather, MD  [facility] Pager [phone]  *Please refer to [email or web address], password [email or web address] to get updated schedule on who will round on this patient, as hospitalists switch teams weekly. If 7PM-7AM, please  contact night-coverage at [email or web address], password [email or web address] for any overnight needs.  02/23/2016, 3:31 PM

## 2016-02-23 NOTE — Progress Notes (Signed)
Date:  February 23, 2016 Chart reviewed for concurrent status and case management needs. Will continue to follow patient progress.  hgb 7.6/bp98/48/rec'ing bld products today. Discharge Planning: following for needs Expected discharge date: [id number] Aston Lieske, BSN, [place], [place]

## 2016-02-23 NOTE — Progress Notes (Signed)
Echocardiogram 2D Echocardiogram has been performed.  Aggie Cosier 02/23/2016, 9:01 AM

## 2016-02-23 NOTE — Progress Notes (Signed)
Pharmacy Antibiotic Note  [name] is a 75 y.o. male admitted on 02/15/2016 with anemia and GIB.  Pharmacy has been consulted for Vancomycin and Zosyn dosing for sepsis/pneumnia that developed during his EGD on 11/14.  Now adding Fluconazole dosing for fungal esophagitis also seen on EGD.  Results of EGD brushing pending SCr increasing (2.4 > 2.65 > 2.74), with CrCl ~ 22 ml/min Febrile on 11/15 to 102.7 at 0400, after APAP decreased to 100.4 at 0800.  Afebrile since that time. WBC 10.8  Plan:  Fluconazole 200mg  IV q24h.  Continue Zosyn 3.375g IV Q8H infused over 4hrs.  Continue Vancomycin 750 mg IV q24h.  Measure Vanc trough at steady state, prior to 11/16 dose.  Follow up renal fxn, culture results, and clinical course.   Height: 5' 8.5" (174 cm) Weight: 143 lb 11.8 oz (65.2 kg) IBW/kg (Calculated) : 69.55  Temp (24hrs), Avg:99.1 F (37.3 C), Min:97 F (36.1 C), Max:100.4 F (38 C)   Recent Labs Lab 02/17/16 0340  02/19/16 0525 02/20/16 0425 02/21/16 0339 02/22/16 0307 02/23/16 0312  WBC 13.9*  --   --  12.8* 15.1* 8.7 10.8*  CREATININE 3.24*  < > 2.97* 2.70* 2.40* 2.65* 2.74*  < > = values in this interval not displayed.  Estimated Creatinine Clearance: 21.8 mL/min (by C-G formula based on SCr of 2.74 mg/dL (H)).    No Known Allergies  Antimicrobials this admission: 11/14 vanc >> 11/14 zosyn >> 11/16 fluconazole >>  Dose adjustments this admission: 11/16 1300 VT =   Microbiology results: 11/9 MRSA: negative 11/14 bcx x2: ngtd  Thank you for allowing pharmacy to be a part of this patient's care.  Gretta Arab PharmD, BCPS Pager [phone] 02/23/2016 7:21 AM

## 2016-02-23 NOTE — Clinical Social Work Note (Signed)
PT evaluation faxed to [facility] via [facility] for patient's return.   MSW will continue to follow patient and pt's family for continued support and to facilitate pt's discharge needs back to [facility].   Glendon Axe, MSW [phone] 02/23/2016 9:43 AM

## 2016-02-24 ENCOUNTER — Other Ambulatory Visit: Payer: Self-pay | Admitting: Endocrinology

## 2016-02-24 LAB — GLUCOSE, CAPILLARY
GLUCOSE-CAPILLARY: 170 mg/dL — AB (ref 65–99)
GLUCOSE-CAPILLARY: 176 mg/dL — AB (ref 65–99)
Glucose-Capillary: 189 mg/dL — ABNORMAL HIGH (ref 65–99)
Glucose-Capillary: 173 mg/dL — ABNORMAL HIGH (ref 65–99)
Glucose-Capillary: 161 mg/dL — ABNORMAL HIGH (ref 65–99)
Glucose-Capillary: 159 mg/dL — ABNORMAL HIGH (ref 65–99)

## 2016-02-24 LAB — MAGNESIUM: Magnesium: 1.4 mg/dL — ABNORMAL LOW (ref 1.7–2.4)

## 2016-02-24 LAB — COMPREHENSIVE METABOLIC PANEL
ALBUMIN: 1.4 g/dL — AB (ref 3.5–5.0)
ALT: 15 U/L — AB (ref 17–63)
ANION GAP: 9 (ref 5–15)
AST: 42 U/L — ABNORMAL HIGH (ref 15–41)
Alkaline Phosphatase: 72 U/L (ref 38–126)
BILIRUBIN TOTAL: 0.9 mg/dL (ref 0.3–1.2)
BUN: 45 mg/dL — ABNORMAL HIGH (ref 6–20)
CO2: 23 mmol/L (ref 22–32)
CREATININE: 2.67 mg/dL — AB (ref 0.61–1.24)
Calcium: 7.6 mg/dL — ABNORMAL LOW (ref 8.9–10.3)
Chloride: 113 mmol/L — ABNORMAL HIGH (ref 101–111)
GFR calc Af Amer: 25 mL/min — ABNORMAL LOW (ref >60)
GFR calc non Af Amer: 22 mL/min — ABNORMAL LOW (ref >60)
GLUCOSE: 173 mg/dL — AB (ref 65–99)
Potassium: 3 mmol/L — ABNORMAL LOW (ref 3.5–5.1)
Sodium: 145 mmol/L (ref 135–145)
TOTAL PROTEIN: 4.8 g/dL — AB (ref 6.5–8.1)

## 2016-02-24 LAB — TYPE AND SCREEN
ABO/RH(D): B POS
ANTIBODY SCREEN: NEGATIVE
Unit division: 0

## 2016-02-24 LAB — CBC
HCT: 26.6 % — ABNORMAL LOW (ref 39.0–52.0)
Hemoglobin: 8.8 g/dL — ABNORMAL LOW (ref 13.0–17.0)
MCH: 28.4 pg (ref 26.0–34.0)
MCHC: 33.1 g/dL (ref 30.0–36.0)
MCV: 85.8 fL (ref 78.0–100.0)
PLATELETS: 319 10*3/uL (ref 150–400)
RBC: 3.1 MIL/uL — AB (ref 4.22–5.81)
RDW: 16.3 % — AB (ref 11.5–15.5)
WBC: 7.4 10*3/uL (ref 4.0–10.5)

## 2016-02-24 MED ORDER — MAGNESIUM SULFATE 2 GM/50ML IV SOLN
2.0000 g | Freq: Once | INTRAVENOUS | Status: AC
  Administered 2016-02-24: 2 g via INTRAVENOUS
  Filled 2016-02-24: qty 50

## 2016-02-24 MED ORDER — SODIUM CHLORIDE 0.9 % IV BOLUS (SEPSIS): 500.0000 mL | Freq: Once | INTRAVENOUS | Status: DC

## 2016-02-24 MED ORDER — INSULIN ASPART 100 UNIT/ML ~~LOC~~ SOLN
0.0000 [IU] | SUBCUTANEOUS | Status: DC
  Administered 2016-02-24 – 2016-02-25 (×5): 3 [IU] via SUBCUTANEOUS
  Administered 2016-02-25 – 2016-02-26 (×4): 2 [IU] via SUBCUTANEOUS
  Administered 2016-02-26: 1 [IU] via SUBCUTANEOUS
  Administered 2016-02-26 – 2016-02-27 (×5): 3 [IU] via SUBCUTANEOUS
  Administered 2016-02-28: 2 [IU] via SUBCUTANEOUS
  Administered 2016-02-28 – 2016-02-29 (×3): 3 [IU] via SUBCUTANEOUS
  Administered 2016-02-29: 2 [IU] via SUBCUTANEOUS

## 2016-02-24 MED ORDER — SODIUM CHLORIDE 0.45 % IV SOLN
INTRAVENOUS | Status: DC
  Administered 2016-02-24 – 2016-02-25 (×2): via INTRAVENOUS
  Filled 2016-02-24 (×7): qty 1000

## 2016-02-24 MED ORDER — KCL IN DEXTROSE-NACL 20-5-0.45 MEQ/L-%-% IV SOLN
INTRAVENOUS | Status: DC
  Administered 2016-02-24: 08:00:00 via INTRAVENOUS
  Filled 2016-02-24: qty 1000

## 2016-02-24 MED ORDER — SODIUM CHLORIDE 0.45 % IV SOLN
INTRAVENOUS | Status: DC
  Filled 2016-02-24: qty 1000

## 2016-02-24 MED ORDER — POTASSIUM CHLORIDE 2 MEQ/ML IV SOLN: INTRAVENOUS | Status: DC

## 2016-02-24 NOTE — Progress Notes (Signed)
[name]   Progress Note  [name]  [id number] DOB: 05-Dec-1940 DOA: 02/15/2016 PCP: [name], FNP   Brief Narrative:   [name] is an 75 y.o. male with history of metastatic prostate cancer, HTN, T2DM, CKD with chronic anemia sent to the ER for hemoglobin of 7 with melanotic stools. FOBT+. Evaluation with EGD 11/10 showed dilated esophagus consistent with achalasia, biopsy showed reactive inflammation without malignancy. Repeat EGD 11/14 again showed severe esophagitis, though botox was not administered due to intraprocedural hypotension and fever concerning for SIRS/sepsis.He was transferred to SDU, started on broad spectrum antibiotics which have been narrowed to zosyn. Fluconazole was empirically started for evidence of candidal esophagitis on EGD. Cortisol checked the next day was 3.9 and stress-steroids were started with abrupt improvement in mental status and SIRS. Antibiotics for possible aspiration continuing. GI to reevaluate.  Assessment/Plan:   SIRS due to aspiration pneumonia/pneumonitis vs. adrenal insufficiency: Recently treated for HCAP 10/21 - 10/27 based on LLL infiltrate at that time.  - Vanc/zosyn started 11/14 empirically for worsening SIRS with hypotension, and transferred to SDU following EGD. D/C vanc as MRSA swab negative and suspicion is for aspiration event. - Blood cultures 11/14 pending: Consider D/C zosyn if these remain negative.  - No further IVF with worsening effusions on CXR - Stress steroids today with prolonged taper  Candida esophagitis: No esophageal fungal cultures obtained.  - Empiric Tx recommended by GI.  - Fluconazole 11/16 >> 11/29  Hypoglycemia: Mild, recurrent, without hypoglycemic administration concerning for adrenal insufficiency confirmed by AM cortisol 11/16.  - Start steroids and monitor CBG q4h while NPO.  - Hyperglycemic: Increase to moderate SSI.  Has h/o admission for steroid-induced  hyperglycemia.   Bilateral pleural effusions and atelectasis: Suggestive of CHF. Pt is on lasix daily at home but no Dx CHF. Echo 11/16 shows mild LVH, G2DD and IVC appearance suggestive of normal CVP.  - Maintenance IVF while NPO - Continue home lasix with fluids  Acute GI bleed: Due to severe esophagitis related to achalasia seen on EGD. No botox 11/14 due to concern for sepsis.  - Required transfusion 2u PRBCs 11/16. - Still holding anticoagulation - Biopsy 11/10 showed acute reactive inflammation, no malignancy detected. - Continue NPO - Protonix IV BID  Essential hypertension: Hypotensive currently. Hold antihypertensives.  - Monitor hypotension.   History of castration-resistant prostate CA: Followed by Dr. Alen Blew - Hold zytiga - Added Dr. Alen Blew to treatment team. Outpatient follow up was scheduled 11/22  Normocytic, normochromic anemia: Acute on chronic due to esophagitis. Hgb 7 > 9.5 s/p 2u PRBCs dropped recurrently 11/16 7.6 > 8.3 s/p 1u PRBCs. - Continue to monitor - Holding po iron while NPO  Recently diagnosed chronic RLE DVT: - Actively bleeding now, will awaiting GI recommendations to restart anticoagulation. - Continue to hold lovenox.  CKD stage III: Suspect 2.5-2.7 is baseline, initially with AKI (Cr 3.65 at admission), has since returned to baseline.  - Hold nephrotoxins, continuing maintenance fluids and home lasix. - Monitor renal function daily  Hypokalemia: Recurrent, due to NPO, diuretic. - Continue cautious repletion in IV infusion as needed with CKD. - Also hypomagnesemic, will replete 2g IV now.  - Recheck in AM  Hypernatremia: Resolved.   T2DM: HbA1c 7.1% in Sept 2017.  - Hold oral medications - Check CBGs q4h while NPO  Altered mental status: 02/21/2016 following start of seroquel for nocturnal delirium.  - Hold seroquel and minimize haldol as needed for patient  safety/agitation.  - Treat SIRS  DVT prophylaxis: SCDs Family Communication:  Nephew at bedside Disposition Plan/Barrier to D/C: D/C to SNF following IV treatment of aspiration pneumonia. GI intervention following resuscitation. Will need PT reevaluation. Code Status: Full  IV Access:    Peripheral IV  Procedures and diagnostic studies:   Dg Chest Port 1 View  Result Date: 02/22/2016 CLINICAL DATA:  CHF, sepsis EXAM: PORTABLE CHEST 1 VIEW COMPARISON:  02/21/2016 FINDINGS: There is mild bilateral interstitial thickening. There is no focal parenchymal opacity. There is no pleural effusion or pneumothorax. The heart and mediastinal contours are unremarkable. The osseous structures are unremarkable. IMPRESSION: Findings most consistent with mild interstitial edema. Electronically Signed   By: Kathreen Devoid   On: 02/22/2016 16:21   Medical Consultants:    Gastroenterology, Dr. Hilarie Fredrickson  Anti-Infectives:   Vancomycin 11/14 >> 11/16 Zosyn 11/14 >>  Diflucan 11/15 >>   Subjective:   [name] is increasingly lucid every day. Family says he's rturning to his baseline. No dyspnea, chest pain, palpitations, abdominal pain, N/V. Now hungry.  Objective:    Vitals:   02/24/16 0900 02/24/16 1000 02/24/16 1100 02/24/16 1200  BP: 119/73 127/72 (!) 158/75 120/70  Pulse: 79 81 83 80  Resp: 20 18 (!) 30 19  Temp:      TempSrc:      SpO2: 99% 100% 100% 100%  Weight:      Height:        Intake/Output Summary (Last 24 hours) at 02/24/16 1304 Last data filed at 02/24/16 1200  Gross per 24 hour  Intake          2051.25 ml  Output             1400 ml  Net           651.25 ml   Filed Weights   02/23/16 0000 02/23/16 2335 02/24/16 0316  Weight: 65.2 kg (143 lb 11.8 oz) 66.3 kg (146 lb 2.6 oz) 66.3 kg (146 lb 2.6 oz)   Exam: General exam: Elderly male in no distress  Respiratory system: Nonlabored, minimal bibasilar rales and rhonchi. Cardiovascular system: RRR, no murmur. No JVD. Trace pedal edema R > L.  Gastrointestinal system: Abdomen is nondistended, soft  and nontender.  Extremities: No deformities Skin: Stage II partial thickness open ulcer with healthy wound bed. Minimal surrounding erythema.  Psychiatry: More alert and responsive today with some conversation  Data Reviewed:    Labs: Basic Metabolic Panel:  Recent Labs Lab 02/20/16 0425 02/21/16 0339 02/22/16 0307 02/23/16 0312 02/24/16 0302  NA 147* 146* 142 144 145  K 3.1* 3.0* 4.5 4.0 3.0*  CL 113* 113* 111 112* 113*  CO2 25 25 24 24 23   GLUCOSE 261* 104* 286* 85 173*  BUN 63* 57* 53* 50* 45*  CREATININE 2.70* 2.40* 2.65* 2.74* 2.67*  CALCIUM 8.0* 8.0* 7.6* 7.9* 7.6*  MG  --  1.2*  --   --  1.4*   GFR Estimated Creatinine Clearance: 22.8 mL/min (by C-G formula based on SCr of 2.67 mg/dL (H)). Liver Function Tests:  Recent Labs Lab 02/24/16 0302  AST 42*  ALT 15*  ALKPHOS 72  BILITOT 0.9  PROT 4.8*  ALBUMIN 1.4*   No results for input(s): LIPASE, AMYLASE in the last 168 hours. No results for input(s): AMMONIA in the last 168 hours. Coagulation profile No results for input(s): INR, PROTIME in the last 168 hours.  CBC:  Recent Labs Lab 02/20/16 0425 02/21/16 [id number]  02/22/16 0307 02/23/16 0312 02/23/16 1605 02/24/16 0302  WBC 12.8* 15.1* 8.7 10.8*  --  7.4  HGB 8.5* 9.0* 8.0* 7.6* 8.3* 8.8*  HCT 25.9* 27.8* 24.8* 23.7* 25.3* 26.6*  MCV 86.9 88.5 87.6 89.1  --  85.8  PLT 420* 409* 322 308  --  319   Cardiac Enzymes: No results for input(s): CKTOTAL, CKMB, CKMBINDEX, TROPONINI in the last 168 hours. BNP (last 3 results) No results for input(s): PROBNP in the last 8760 hours. CBG:  Recent Labs Lab 02/23/16 1949 02/23/16 2332 02/24/16 0330 02/24/16 0818 02/24/16 1258  GLUCAP 159* 162* 170* 189* 173*   D-Dimer: No results for input(s): DDIMER in the last 72 hours. Hgb A1c: No results for input(s): HGBA1C in the last 72 hours. Lipid Profile: No results for input(s): CHOL, HDL, LDLCALC, TRIG, CHOLHDL, LDLDIRECT in the last 72 hours. Thyroid  function studies: No results for input(s): TSH, T4TOTAL, T3FREE, THYROIDAB in the last 72 hours.  Invalid input(s): FREET3 Anemia work up: No results for input(s): VITAMINB12, FOLATE, FERRITIN, TIBC, IRON, RETICCTPCT in the last 72 hours. Sepsis Labs:  Recent Labs Lab 02/21/16 0339 02/22/16 0307 02/23/16 0312 02/24/16 0302  WBC 15.1* 8.7 10.8* 7.4   Microbiology Recent Results (from the past 240 hour(s))  MRSA PCR Screening     Status: None   Collection Time: 02/16/16  4:54 AM  Result Value Ref Range Status   MRSA by PCR NEGATIVE NEGATIVE Final    Comment:        The GeneXpert MRSA Assay (FDA approved for NASAL specimens only), is one component of a comprehensive MRSA colonization surveillance program. It is not intended to diagnose MRSA infection nor to guide or monitor treatment for MRSA infections.   Culture, blood (routine x 2)     Status: None (Preliminary result)   Collection Time: 02/21/16  6:50 AM  Result Value Ref Range Status   Specimen Description BLOOD RIGHT ARM  Final   Special Requests BOTTLES DRAWN AEROBIC ONLY 5CC  Final   Culture   Final    NO GROWTH 2 DAYS Performed at [facility]    Report Status PENDING  Incomplete  Culture, blood (routine x 2)     Status: None (Preliminary result)   Collection Time: 02/21/16  6:51 AM  Result Value Ref Range Status   Specimen Description BLOOD RIGHT HAND  Final   Special Requests BOTTLES DRAWN AEROBIC AND ANAEROBIC 5CC  Final   Culture   Final    NO GROWTH 2 DAYS Performed at [facility]    Report Status PENDING  Incomplete   Medications:   . feeding supplement (ENSURE ENLIVE)  237 mL Oral BID BM  . fluconazole (DIFLUCAN) IV  200 mg Intravenous Q24H  . furosemide  20 mg Intravenous Daily  . insulin aspart  0-15 Units Subcutaneous Q4H  . methylPREDNISolone (SOLU-MEDROL) injection  80 mg Intravenous Q12H  . pantoprazole (PROTONIX) IV  40 mg Intravenous Q12H  . piperacillin-tazobactam  (ZOSYN)  IV  3.375 g Intravenous Q8H  . sodium chloride  500 mL Intravenous Once   Continuous Infusions: . dextrose 5 % and 0.45 % NaCl with KCl 20 mEq/L 75 mL/hr at 02/24/16 0826   Time spent: 25 min   LOS: 8 days   Vance Gather, MD  [facility] Pager [phone]  *Please refer to [email or web address], password [email or web address] to get updated schedule on who will round on this patient, as hospitalists switch teams weekly. If 7PM-7AM, please  contact night-coverage at [email or web address], password [email or web address] for any overnight needs.  02/24/2016, 1:04 PM

## 2016-02-24 NOTE — Progress Notes (Signed)
Physical Therapy Treatment Patient Details Name: [name] MRN: [id number] DOB: [birth year] Today's Date: 02/24/2016    History of Present Illness 75 yo male admitted with GI hemorrhage, achalasia. Hx of HTN, CKD, DVT, DM, prostate cancer, falls.     PT Comments    Patient was seen in bed upon arrival. BP in supine 120/72, HR 81, O2 100%; sitting on BSC BP 133/66, HR 81 bpm, O2 100%. Performed supine to sit x max A x 2 to move LE's in bed and to raise trunk. VC's given to try and get patient participation. Sit to stand x modA with 2 side by side assistance. VC's required for UE placement. ModA required for standing pivot transfer with 75% VC's to side step and take steps back toward commode. Patient c/o minor pain on bottom d/t bed sore. Patient presents with tight hip/knee flexors in standing. Patient is limited due to weakness, decreased activity tolerance, and decreased endurance.   Follow Up Recommendations  SNF     Equipment Recommendations  Rolling walker with 5" wheels    Recommendations for Other Services       Precautions / Restrictions Precautions Precautions: Fall Restrictions Weight Bearing Restrictions: No    Mobility  Bed Mobility Overal bed mobility: Needs Assistance       Supine to sit: +2 for safety/equipment;HOB elevated;+2 for physical assistance;Max assist     General bed mobility comments: MaxA required to raise trunk and to move BLE's in bed. VC's for patient to assist as much as possible.  Transfers Overall transfer level: Needs assistance Equipment used: Rolling walker (2 wheeled) Transfers: Sit to/from [facility] Sit to Stand: +2 physical assistance;Mod assist;From elevated surface;+2 safety/equipment Stand pivot transfers: +2 physical assistance;Mod assist;+2 safety/equipment;From elevated surface       General transfer comment: VC's for safe LE/UE placement placement.  2 side by side assistance with elevated bed prior  to sit to stand. VC's to side step and for controlled descent onto [facility] and recliner. Patient has tight hip/knee flexors.   Ambulation/Gait                 Stairs            Wheelchair Mobility    Modified Rankin (Stroke Patients Only)       Balance                                    Cognition Arousal/Alertness: Awake/alert Behavior During Therapy: WFL for tasks assessed/performed                        Exercises      General Comments        Pertinent Vitals/Pain Pain Assessment: Faces Pain Score: 4  Pain Location: butt d/t bed sore Pain Descriptors / Indicators: Discomfort Pain Intervention(s): Monitored during session;Repositioned;Limited activity within patient's tolerance    Home Living                      Prior Function            PT Goals (current goals can now be found in the care plan section) Progress towards PT goals: Progressing toward goals    Frequency    Min 2X/week      PT Plan Current plan remains appropriate    Co-evaluation  End of Session Equipment Utilized During Treatment: Gait belt Activity Tolerance: Patient tolerated treatment well;Patient limited by fatigue Patient left: with call bell/phone within reach;with bed alarm set;in chair     Time: [id number] PT Time Calculation (min) (ACUTE ONLY): 33 min  Charges:  $Therapeutic Activity: 23-37 mins                    G CodesHall Busing, SPTA [facility] [phone]  Present and agree with above  Rica Koyanagi  PTA [facility]  Acute  Rehab Pager      [phone]

## 2016-02-24 NOTE — Progress Notes (Signed)
Initial Nutrition Assessment  DOCUMENTATION CODES:   Not applicable  INTERVENTION:  - Diet advancement if medically feasible. - RD will continue to monitor for nutrition-related needs.  NUTRITION DIAGNOSIS:   Inadequate oral intake related to inability to eat as evidenced by NPO status.  GOAL:   Patient will meet greater than or equal to 90% of their needs  MONITOR:   Diet advancement, Weight trends, Labs, Skin, I & O's  REASON FOR ASSESSMENT:   Malnutrition Screening Tool  ASSESSMENT:   75 y.o. male with history of metastatic prostate cancer, HTN, T2DM, CKD with chronic anemia sent to the ER for hemoglobin of 7 with melanotic stools. FOBT+. Evaluation with EGD 11/10 showed dilated esophagus consistent with achalasia, biopsy showed reactive inflammation without malignancy.Repeat EGD 11/14 again showed severe esophagitis, though botox was not administered due to intraprocedural hypotension and fever concerning for SIRS/sepsis.   Pt seen for MST. BMI indicates normal weight. Pt has been NPO since 11/14 and previously was on FLD from 11/11-11/14 with documented intakes of 20% of breakfast and lunch on 11/11. Pt resting at this time with no family/visitors at bedside. Pt has been confused yesterday and today.   Unable to perform physical assessment at this time; no visible muscle or fat wasting to upper body. Per chart review, wt stable over the past 2 months. Pt has lost 5 lbs (3% body weight) in 6 months which is not significant for time frame.   Per MD note yesterday afternoon, plan for "d/c to SNF following IV treatment of aspiration pneumonia." No SLP notes this admission.  Medications reviewed; 20 mg IV Lasix/day, sliding scale Novolog, 2 g IV Mg sulfate x1 doe today, 80 mg IV Solu-medrol BID, PRN IV Zofran, 40 mg IV Protonix BID.  Labs reviewed; HgbA1c: 7.1% in September 2017, CBGs: 170 and 189 mg/dL, K: 3 mmol/L, Mg: 1.4 mg/dL, Cl: 113 mmol/L, BUN: 45 mg/dL, creatinine: 2.67  mg/dL, Ca: 7.6 mg/dL, GFR: 25 mL/min.   IVF: D5-1/2 NS-20 mEq KCl @ 75 mL/hr (306 kcal).    Diet Order:  Diet NPO time specified  Skin:  Wound (see comment) (Stage 2 sacral pressure injury)  Last BM:  11/16  Height:   Ht Readings from Last 1 Encounters:  02/21/16 5' 8.5" (1.74 m)    Weight:   Wt Readings from Last 1 Encounters:  02/24/16 146 lb 2.6 oz (66.3 kg)    Ideal Body Weight:  71.36 kg  BMI:  Body mass index is 21.9 kg/m.  Estimated Nutritional Needs:   Kcal:  1860-2055 (28-31 kcal/kg)  Protein:  86-99 grams (1.3-1.5 grams/kg)  Fluid:  >/= 1.8 L/day  EDUCATION NEEDS:   No education needs identified at this time    Jarome Matin, MS, RD, LDN Inpatient Clinical Dietitian Pager # [phone] After hours/weekend pager # [phone]

## 2016-02-24 NOTE — Progress Notes (Signed)
Report called to charge nurse. PT stable and aware of transfer. All belongings transferred with patient. Son was called and given new room number.

## 2016-02-24 NOTE — Progress Notes (Addendum)
Patient has only had 200cc of charted urine today (unsure if urine output while in ICU was not charted).  Bladder is soft and not distended.  MD made aware.  Will continue to monitor closely.  Bladder scan showing 30cc of urine in bladder.

## 2016-02-25 ENCOUNTER — Encounter (HOSPITAL_COMMUNITY): Payer: Self-pay

## 2016-02-25 LAB — CBC
HCT: 27.2 % — ABNORMAL LOW (ref 39.0–52.0)
Hemoglobin: 9.1 g/dL — ABNORMAL LOW (ref 13.0–17.0)
MCH: 28.6 pg (ref 26.0–34.0)
MCHC: 33.5 g/dL (ref 30.0–36.0)
MCV: 85.5 fL (ref 78.0–100.0)
PLATELETS: 362 10*3/uL (ref 150–400)
RBC: 3.18 MIL/uL — AB (ref 4.22–5.81)
RDW: 16.2 % — AB (ref 11.5–15.5)
WBC: 17.7 10*3/uL — AB (ref 4.0–10.5)

## 2016-02-25 LAB — BASIC METABOLIC PANEL
ANION GAP: 12 (ref 5–15)
BUN: 44 mg/dL — AB (ref 6–20)
CO2: 19 mmol/L — AB (ref 22–32)
Calcium: 7.5 mg/dL — ABNORMAL LOW (ref 8.9–10.3)
Chloride: 114 mmol/L — ABNORMAL HIGH (ref 101–111)
Creatinine, Ser: 2.91 mg/dL — ABNORMAL HIGH (ref 0.61–1.24)
GFR calc Af Amer: 23 mL/min — ABNORMAL LOW (ref >60)
GFR, EST NON AFRICAN AMERICAN: 20 mL/min — AB (ref >60)
GLUCOSE: 151 mg/dL — AB (ref 65–99)
POTASSIUM: 3.1 mmol/L — AB (ref 3.5–5.1)
Sodium: 145 mmol/L (ref 135–145)

## 2016-02-25 LAB — GLUCOSE, CAPILLARY
GLUCOSE-CAPILLARY: 136 mg/dL — AB (ref 65–99)
GLUCOSE-CAPILLARY: 92 mg/dL (ref 65–99)
GLUCOSE-CAPILLARY: 157 mg/dL — AB (ref 65–99)
Glucose-Capillary: 150 mg/dL — ABNORMAL HIGH (ref 65–99)
Glucose-Capillary: 123 mg/dL — ABNORMAL HIGH (ref 65–99)

## 2016-02-25 LAB — MAGNESIUM: MAGNESIUM: 1.7 mg/dL (ref 1.7–2.4)

## 2016-02-25 MED ORDER — METHYLPREDNISOLONE SODIUM SUCC 125 MG IJ SOLR
60.0000 mg | Freq: Two times a day (BID) | INTRAMUSCULAR | Status: DC
  Administered 2016-02-25 – 2016-02-26 (×2): 60 mg via INTRAVENOUS
  Filled 2016-02-25 (×3): qty 2

## 2016-02-25 NOTE — Progress Notes (Signed)
Patient ID: [name], male   DOB: [birth year], 75 y.o.   MRN: [id number]    Progress Note   Subjective   Nurse reports  pt was agitated early am - had Haldol , and now sleepy   Objective   Vital signs in last 24 hours: Temp:  [97.6 F (36.4 C)-97.8 F (36.6 C)] 97.8 F (36.6 C) (11/18 0621) Pulse Rate:  [73-87] 82 (11/18 0621) Resp:  [16-30] 16 (11/18 0621) BP: (114-158)/(57-81) 114/57 (11/18 0621) SpO2:  [91 %-100 %] 100 % (11/18 0621) Last BM Date: 02/24/16 General:    Elderly AA male  Somnolent but arousable -had Haldol earlier  Heart:  Regular rate and rhythm; no murmurs Lungs: Respirations even and unlabored, lungs CTA bilaterally Abdomen:  Soft, nontender and nondistended. Normal bowel sounds. Extremities:  Without edema.    Psych:  Cooperative.   Intake/Output from previous day: 11/17 0701 - 11/18 0700 In: 1557.5 [I.V.:1357.5; IV Piggyback:200] Out: 500 [Urine:500] Intake/Output this shift: No intake/output data recorded.  Lab Results:  Recent Labs  02/23/16 0312 02/23/16 1605 02/24/16 0302 02/25/16 0341  WBC 10.8*  --  7.4 17.7*  HGB 7.6* 8.3* 8.8* 9.1*  HCT 23.7* 25.3* 26.6* 27.2*  PLT 308  --  319 362   BMET  Recent Labs  02/23/16 0312 02/24/16 0302 02/25/16 0341  NA 144 145 145  K 4.0 3.0* 3.1*  CL 112* 113* 114*  CO2 24 23 19*  GLUCOSE 85 173* 151*  BUN 50* 45* 44*  CREATININE 2.74* 2.67* 2.91*  CALCIUM 7.9* 7.6* 7.5*   LFT  Recent Labs  02/24/16 0302  PROT 4.8*  ALBUMIN 1.4*  AST 42*  ALT 15*  ALKPHOS 72  BILITOT 0.9   PT/INR No results for input(s): LABPROT, INR in the last 72 hours.  Studies/Results: No results found.     Assessment / Plan:    #1 75 yo AA male with Achalasia , severe esophagitis ,possible superimposed Candida esophagitis - pt became unstable during EGD  Earlier this week  So Botox which was planned was not done He has improved , and we are asked to re-evaluate for EGD/Botox  #2 Adrenal  insufficiency - corrected #3 Aspiration pneumonia /pneumonitis- - improved - On Zosyn #4 AODM #4 Bilateral effusions/CHF #5 metatstaic prostate CA #6 CKD- stage IV #7 AMS, underlying dementia  Plan; Will start Full liquid diet  This weekend- do not advance as esophagus not emptying Upright 90 degrees for meals and 2 hours post meals Will schedule for EGD with Botox on Monday - pt will require  MAC  sedation  Principal Problem:   Acute GI bleeding Active Problems:   Essential hypertension   PROSTATE CANCER, HX OF   Anemia   Acute blood loss anemia   Acute esophagitis   Food impaction of esophagus   Achalasia of esophagus   Pressure injury of skin   Hypernatremia   Heme positive stool   Sepsis ([place])     LOS: 9 days   Greg Eckrich  02/25/2016, 10:53 AM

## 2016-02-25 NOTE — Progress Notes (Signed)
Patient is more alert now and following commands. I took his mittens off and patient is resting and calm. Will continue to monitor closely.

## 2016-02-25 NOTE — Progress Notes (Signed)
During assessment patient became more confused, agitated, and  combative stating that "he was getting up to use the bathroom" and "that he was at home". Patient stated that "he thought we were out to get him" and refused to stay in the bed. Patient started pulling out condom cath, telemetry monitor, and IV tubing. Soft mittens were applied as well as medicated which didn't work. Patient then became more combative kicking and hitting so MD was notified for posey belt. [employer]as[employer] sitter at bedside. Vital signs are stable as well as blood sugar.  Will continue to monitor closely.

## 2016-02-25 NOTE — Progress Notes (Addendum)
[name]   Progress Note  [name]  [id number] DOB: 22-Feb-1941 DOA: 02/15/2016 PCP: [name], FNP   Brief Narrative:   [name] is an 75 y.o. male with history of metastatic prostate cancer, HTN, T2DM, CKD with chronic anemia sent to the ER for hemoglobin of 7 with melanotic stools. FOBT+. Evaluation with EGD 11/10 showed dilated esophagus consistent with achalasia, biopsy showed reactive inflammation without malignancy. Repeat EGD 11/14 again showed severe esophagitis, though botox was not administered due to intraprocedural hypotension and fever concerning for SIRS/sepsis.He was transferred to SDU, started on broad spectrum antibiotics which have been narrowed to zosyn. Fluconazole was empirically started for evidence of candidal esophagitis on EGD. Cortisol checked the next day was 3.9 and stress-steroids were started with abrupt improvement in mental status and SIRS. Antibiotics for possible aspiration continuing. GI to reevaluate.  Assessment/Plan:   Aspiration pneumonia/pneumonitis vs. adrenal insufficiency: SIRS resolved. Recently treated for HCAP 10/21 - 10/27 based on LLL infiltrate at that time.  - Vanc/zosyn started 11/14 empirically for worsening SIRS with hypotension, and transferred to SDU following EGD, transferred to floor 11/17. D/C'ed vanc as MRSA swab negative and suspicion is for aspiration event. - Blood cultures 11/14 pending: Consider D/C zosyn if these remain negative x 5 days.   ABLA on chronic normocytic anemia of chronic disease due to acute GI bleed: Due to severe esophagitis related to achalasia seen on EGD. No botox 11/14 due to concern for sepsis. Hgb 7 > 9.5 s/p 2u PRBCs dropped recurrently 11/16 7.6 > 8.3 s/p 1u PRBCs. Biopsy 11/10 showed acute reactive inflammation, no malignancy detected. - Still holding anticoagulation - Protonix IV BID - Contacted GI who will see him today. Will hold on starting diet pending  their evaluation.  PCM: NPO since 11/14.  - Will need nutrition in some form. Defer to GI whether he can continue po.  Candida esophagitis: No esophageal fungal cultures obtained.  - Empiric Tx recommended by GI.  - Fluconazole 11/16 >> 11/29  Adrenal insufficiency: Due to recent discontinuation of prednisone due to hyperglycemia. SIRS occurred following stress of EGD. Cortisol 3.9. Stress steroids started 11/16 with significant improvement in vital signs and mentation.  - Continue taper of stress steroids (solumedrol 80mg  q12h > 60mg  q12h 11/18)   T2DM: worsened by steroid use. HbA1c 7.1% Sept 2017. Holding outpatient glipizide 2.5 mg QAM, tradjenta 5 mg QAM, Actos 30 mg QD - Monitor CBGs q4h, increased SSI to moderate - maintaining inpatient goal.  Bilateral pleural effusions and atelectasis: Suggestive of CHF. Pt is on lasix daily at home but no Dx CHF. Echo 11/16 shows mild LVH, G2DD and IVC appearance suggestive of normal CVP.  - Maintenance IVF while NPO - Holding lasix, 100% on room air, lungs clear  Essential hypertension: Hypotensive currently. Hold antihypertensives.  - Monitor hypotension.   History of castration-resistant prostate CA: Followed by Dr. Alen Blew - Hold zytiga - Added Dr. Alen Blew to treatment team. Outpatient follow up was scheduled 11/22  Chronic RLE DVT: Dx at recent hospitalization, put on xarelto, on hold due to GI bleeding. - Actively bleeding now, will awaiting GI recommendations to restart anticoagulation. - Continue to hold lovenox.  CKD stage IV: Suspect 2.5-2.7 is baseline, initially with AKI (Cr 3.65 at admission), has since returned to baseline.  - Hold nephrotoxins, continuing maintenance fluids. Holding lasix. - Monitor renal function daily  Hypokalemia: Recurrent, due to NPO, diuretic. - Continue cautious repletion in IV infusion as  needed with CKD. - Also hypomagnesemic (resolved s/p repletion  - Recheck in AM  Hypernatremia: Resolved.    T2DM: HbA1c 7.1% in Sept 2017.  - Hold oral medications - Check CBGs q4h while NPO  Altered mental status: 02/21/2016 following start of seroquel for nocturnal delirium. Continues, but improving.  - Hold seroquel and minimize haldol as needed for patient safety/agitation.  - Continue remote sitter prn, D/C mittens, start posey belt as needed for patient safety.  DVT prophylaxis: SCDs Family Communication: None at bedside this AM Disposition Plan/Barrier to D/C: D/C to SNF following IV treatment of aspiration pneumonia, weaning to po steroids for adrenal insufficiency, and GI intervention. Will need PT reevaluation. Code Status: Full  IV Access:    Peripheral IV  Procedures and diagnostic studies:   EGD 11/14:  Impression:                - Achalasia esophagus. Dilation in the entire esophagus with stasis and pooled fluid with pill fragments. - Severe esophagitis due to achalasia (reflux, stasis and probable fungal). - Gastritis. - Normal examined duodenum. - Botox to LES was planned, but decision was made not to administer today due to hypotension and concern for developing sepsis.  Recommendation:            - Return patient to hospital ward for ongoing care. - NPO for now. - IV fluids for resuscitation. Blood cultures. Broad spectrum antibiotics in treatment for pneumonia. Fluconazole for fungal esophagitis. - Change medication to IV for now. - Can consider repeat EGD for Botox once condition stabilizes. If Botox fails to improve symptoms, he may need consideration of PEG. Medical Consultants:    Gastroenterology, Dr. Hilarie Fredrickson  Anti-Infectives:   Vancomycin 11/14 >> 11/16 Zosyn 11/14 >>  Diflucan 11/15 >>   Subjective:   [name] is increasingly lucid, conversant this AM. Family says he's returning to his baseline. Reports hunger.  Objective:    Vitals:   02/24/16 1501 02/24/16 1530 02/24/16 2100 02/25/16 0621  BP: 133/73 120/72 124/81 (!) 114/57  Pulse: 79  81 73 82  Resp: 20  20 16   Temp: 97.6 F (36.4 C)  97.6 F (36.4 C) 97.8 F (36.6 C)  TempSrc: Oral  Oral Oral  SpO2: 91% 100% 95% 100%  Weight:      Height:        Intake/Output Summary (Last 24 hours) at 02/25/16 1027 Last data filed at 02/25/16 0600  Gross per 24 hour  Intake             1340 ml  Output              500 ml  Net              840 ml   Filed Weights   02/23/16 0000 02/23/16 2335 02/24/16 0316  Weight: 65.2 kg (143 lb 11.8 oz) 66.3 kg (146 lb 2.6 oz) 66.3 kg (146 lb 2.6 oz)   Exam: General exam: Elderly male in no distress  Respiratory system: Nonlabored, no crackles. Cardiovascular system: RRR, no murmur. No JVD. No edema Gastrointestinal system: Abdomen is nondistended, soft and nontender.  Extremities: No deformities Skin: Stage II partial thickness open ulcer with healthy wound bed. Minimal surrounding erythema.  Psychiatry: More alert and responsive today with some conversation  Data Reviewed:    Labs: Basic Metabolic Panel:  Recent Labs Lab 02/21/16 0339 02/22/16 0307 02/23/16 0312 02/24/16 0302 02/25/16 0341  NA 146* 142 144 145 145  K 3.0* 4.5 4.0 3.0* 3.1*  CL 113* 111 112* 113* 114*  CO2 25 24 24 23  19*  GLUCOSE 104* 286* 85 173* 151*  BUN 57* 53* 50* 45* 44*  CREATININE 2.40* 2.65* 2.74* 2.67* 2.91*  CALCIUM 8.0* 7.6* 7.9* 7.6* 7.5*  MG 1.2*  --   --  1.4* 1.7   GFR Estimated Creatinine Clearance: 20.9 mL/min (by C-G formula based on SCr of 2.91 mg/dL (H)). Liver Function Tests:  Recent Labs Lab 02/24/16 0302  AST 42*  ALT 15*  ALKPHOS 72  BILITOT 0.9  PROT 4.8*  ALBUMIN 1.4*   No results for input(s): LIPASE, AMYLASE in the last 168 hours. No results for input(s): AMMONIA in the last 168 hours. Coagulation profile No results for input(s): INR, PROTIME in the last 168 hours.  CBC:  Recent Labs Lab 02/21/16 0339 02/22/16 0307 02/23/16 0312 02/23/16 1605 02/24/16 0302 02/25/16 0341  WBC 15.1* 8.7 10.8*  --   7.4 17.7*  HGB 9.0* 8.0* 7.6* 8.3* 8.8* 9.1*  HCT 27.8* 24.8* 23.7* 25.3* 26.6* 27.2*  MCV 88.5 87.6 89.1  --  85.8 85.5  PLT 409* 322 308  --  319 362   Cardiac Enzymes: No results for input(s): CKTOTAL, CKMB, CKMBINDEX, TROPONINI in the last 168 hours. BNP (last 3 results) No results for input(s): PROBNP in the last 8760 hours. CBG:  Recent Labs Lab 02/24/16 1623 02/24/16 2010 02/24/16 2154 02/25/16 0620 02/25/16 0744  GLUCAP 176* 161* 159* 136* 150*   D-Dimer: No results for input(s): DDIMER in the last 72 hours. Hgb A1c: No results for input(s): HGBA1C in the last 72 hours. Lipid Profile: No results for input(s): CHOL, HDL, LDLCALC, TRIG, CHOLHDL, LDLDIRECT in the last 72 hours. Thyroid function studies: No results for input(s): TSH, T4TOTAL, T3FREE, THYROIDAB in the last 72 hours.  Invalid input(s): FREET3 Anemia work up: No results for input(s): VITAMINB12, FOLATE, FERRITIN, TIBC, IRON, RETICCTPCT in the last 72 hours. Sepsis Labs:  Recent Labs Lab 02/22/16 0307 02/23/16 0312 02/24/16 0302 02/25/16 0341  WBC 8.7 10.8* 7.4 17.7*   Microbiology Recent Results (from the past 240 hour(s))  MRSA PCR Screening     Status: None   Collection Time: 02/16/16  4:54 AM  Result Value Ref Range Status   MRSA by PCR NEGATIVE NEGATIVE Final    Comment:        The GeneXpert MRSA Assay (FDA approved for NASAL specimens only), is one component of a comprehensive MRSA colonization surveillance program. It is not intended to diagnose MRSA infection nor to guide or monitor treatment for MRSA infections.   Culture, blood (routine x 2)     Status: None (Preliminary result)   Collection Time: 02/21/16  6:50 AM  Result Value Ref Range Status   Specimen Description BLOOD RIGHT ARM  Final   Special Requests BOTTLES DRAWN AEROBIC ONLY 5CC  Final   Culture   Final    NO GROWTH 4 DAYS Performed at [facility]    Report Status PENDING  Incomplete  Culture, blood  (routine x 2)     Status: None (Preliminary result)   Collection Time: 02/21/16  6:51 AM  Result Value Ref Range Status   Specimen Description BLOOD RIGHT HAND  Final   Special Requests BOTTLES DRAWN AEROBIC AND ANAEROBIC 5CC  Final   Culture   Final    NO GROWTH 4 DAYS Performed at [facility]    Report Status PENDING  Incomplete  Medications:   . feeding supplement (ENSURE ENLIVE)  237 mL Oral BID BM  . fluconazole (DIFLUCAN) IV  200 mg Intravenous Q24H  . furosemide  20 mg Intravenous Daily  . insulin aspart  0-15 Units Subcutaneous Q4H  . methylPREDNISolone (SOLU-MEDROL) injection  80 mg Intravenous Q12H  . pantoprazole (PROTONIX) IV  40 mg Intravenous Q12H  . piperacillin-tazobactam (ZOSYN)  IV  3.375 g Intravenous Q8H   Continuous Infusions: . sodium chloride 0.45 % 1,000 mL with potassium chloride 40 mEq infusion 75 mL/hr at 02/25/16 [id number]   Time spent: 25 min   LOS: 9 days   Vance Gather, MD  [facility] Pager [phone]  *Please refer to [email or web address], password [email or web address] to get updated schedule on who will round on this patient, as hospitalists switch teams weekly. If 7PM-7AM, please contact night-coverage at [email or web address], password [email or web address] for any overnight needs.  02/25/2016, 10:27 AM

## 2016-02-26 LAB — GLUCOSE, CAPILLARY
GLUCOSE-CAPILLARY: 114 mg/dL — AB (ref 65–99)
GLUCOSE-CAPILLARY: 121 mg/dL — AB (ref 65–99)
GLUCOSE-CAPILLARY: 150 mg/dL — AB (ref 65–99)
GLUCOSE-CAPILLARY: 165 mg/dL — AB (ref 65–99)
GLUCOSE-CAPILLARY: 184 mg/dL — AB (ref 65–99)
Glucose-Capillary: 139 mg/dL — ABNORMAL HIGH (ref 65–99)
Glucose-Capillary: 121 mg/dL — ABNORMAL HIGH (ref 65–99)

## 2016-02-26 LAB — COMPREHENSIVE METABOLIC PANEL
ALT: 12 U/L — AB (ref 17–63)
ANION GAP: 9 (ref 5–15)
AST: 19 U/L (ref 15–41)
Albumin: 1.4 g/dL — ABNORMAL LOW (ref 3.5–5.0)
Alkaline Phosphatase: 56 U/L (ref 38–126)
BUN: 47 mg/dL — ABNORMAL HIGH (ref 6–20)
CHLORIDE: 114 mmol/L — AB (ref 101–111)
CO2: 20 mmol/L — AB (ref 22–32)
Calcium: 7.1 mg/dL — ABNORMAL LOW (ref 8.9–10.3)
Creatinine, Ser: 2.8 mg/dL — ABNORMAL HIGH (ref 0.61–1.24)
GFR, EST AFRICAN AMERICAN: 24 mL/min — AB (ref >60)
GFR, EST NON AFRICAN AMERICAN: 21 mL/min — AB (ref >60)
Glucose, Bld: 183 mg/dL — ABNORMAL HIGH (ref 65–99)
POTASSIUM: 3.5 mmol/L (ref 3.5–5.1)
SODIUM: 143 mmol/L (ref 135–145)
Total Bilirubin: 0.5 mg/dL (ref 0.3–1.2)
Total Protein: 4.5 g/dL — ABNORMAL LOW (ref 6.5–8.1)

## 2016-02-26 LAB — CULTURE, BLOOD (ROUTINE X 2)
Culture: NO GROWTH
Culture: NO GROWTH

## 2016-02-26 LAB — CBC
HCT: 27.7 % — ABNORMAL LOW (ref 39.0–52.0)
Hemoglobin: 9.1 g/dL — ABNORMAL LOW (ref 13.0–17.0)
MCH: 28.3 pg (ref 26.0–34.0)
MCHC: 32.9 g/dL (ref 30.0–36.0)
MCV: 86.3 fL (ref 78.0–100.0)
PLATELETS: 373 10*3/uL (ref 150–400)
RBC: 3.21 MIL/uL — AB (ref 4.22–5.81)
RDW: 16.2 % — AB (ref 11.5–15.5)
WBC: 13.2 10*3/uL — ABNORMAL HIGH (ref 4.0–10.5)

## 2016-02-26 LAB — MAGNESIUM: MAGNESIUM: 1.7 mg/dL (ref 1.7–2.4)

## 2016-02-26 MED ORDER — LIP MEDEX EX OINT
TOPICAL_OINTMENT | CUTANEOUS | Status: DC | PRN
  Filled 2016-02-26 (×2): qty 7

## 2016-02-26 MED ORDER — SODIUM CHLORIDE 0.45 % IV SOLN
INTRAVENOUS | Status: DC
  Administered 2016-02-26 – 2016-02-29 (×3): via INTRAVENOUS
  Filled 2016-02-26 (×9): qty 1000

## 2016-02-26 MED ORDER — METHYLPREDNISOLONE SODIUM SUCC 40 MG IJ SOLR
40.0000 mg | Freq: Two times a day (BID) | INTRAMUSCULAR | Status: DC
  Administered 2016-02-26 – 2016-02-27 (×2): 40 mg via INTRAVENOUS
  Filled 2016-02-26 (×2): qty 1

## 2016-02-26 NOTE — Progress Notes (Signed)
[name]   Progress Note  [name]  [id number] DOB: [birth year] DOA: 02/15/2016 PCP: [name], FNP   Brief Narrative:   [name] is an 75 y.o. male with history of metastatic prostate cancer, HTN, T2DM, CKD with chronic anemia sent to the ER for hemoglobin of 7 with melanotic stools. FOBT+. Evaluation with EGD 11/10 showed dilated esophagus consistent with achalasia, biopsy showed reactive inflammation without malignancy. Repeat EGD 11/14 again showed severe esophagitis, though botox was not administered due to intraprocedural hypotension and fever concerning for SIRS/sepsis.He was transferred to SDU, started on broad spectrum antibiotics which have been narrowed to zosyn. Fluconazole was empirically started for evidence of candidal esophagitis on EGD. Cortisol checked the next day was 3.9 and stress-steroids were started with abrupt improvement in mental status and SIRS. Antibiotics for possible aspiration continuing. GI to repeat EGD 11/20 with hopes for botox to LES.  Assessment/Plan:   Aspiration pneumonia/pneumonitis vs. adrenal insufficiency: SIRS resolved. Recently treated for HCAP 10/21 - 10/27 based on LLL infiltrate at that time.  - Vanc/zosyn started 11/14 empirically for worsening SIRS with hypotension, and transferred to SDU following EGD, transferred to floor 11/17. D/C'ed vanc as MRSA swab negative and suspicion is for aspiration event. - Blood cultures 11/14 pending: Consider D/C zosyn if these remain negative x 5 days.   ABLA on chronic normocytic anemia of chronic disease due to acute GI bleed: Due to severe esophagitis related to achalasia seen on EGD. No botox 11/14 due to concern for sepsis. Hgb 7 > 9.5 s/p 2u PRBCs dropped recurrently 11/16 7.6 > 8.3 s/p 1u PRBCs. Biopsy 11/10 showed acute reactive inflammation, no malignancy detected. - Still holding anticoagulation - Protonix IV BID - Clear liquid diet, NPO after  midnight  Candida esophagitis: No esophageal fungal cultures obtained.  - Empiric Tx recommended by GI.  - Fluconazole 11/16 >> 11/29  Adrenal insufficiency: Due to recent discontinuation of prednisone due to hyperglycemia. SIRS occurred following stress of EGD. Cortisol 3.9. Stress steroids started 11/16 with significant improvement in vital signs and mentation.  - Continue taper of stress steroids (solumedrol 80mg  q12h > 60mg  q12h 11/18 > 40mg  q12h 11/19)   T2DM: worsened by steroid use. HbA1c 7.1% Sept 2017. Holding outpatient glipizide 2.5 mg QAM, tradjenta 5 mg QAM, Actos 30 mg QD - Monitor CBGs q4h, increased SSI to moderate - maintaining inpatient goal. - Monitor very closely for hypoglycemia while tapering steroids  Chronic diastolic CHF: Echo [id number] shows mild LVH, G2DD and IVC appearance suggestive of normal CVP.  - STOP IVF now, maintenance IVF to restart while NPO - Holding lasix  Essential hypertension: Hypotensive currently. Hold antihypertensives.  - Monitor hypotension.   History of castration-resistant prostate CA: Followed by Dr. Alen Blew - Hold zytiga - Added Dr. Alen Blew to treatment team. Outpatient follow up was scheduled 11/22  Chronic RLE DVT: Dx at recent hospitalization, put on xarelto, on hold due to GI bleeding. - Actively bleeding now, will awaiting GI recommendations to restart anticoagulation. - Continue to hold lovenox.  CKD stage IV: Suspect 2.5-2.7 is baseline, initially with AKI (Cr 3.65 at admission), has since returned to baseline.  - Hold nephrotoxins, continuing maintenance fluids. Holding lasix. - Monitor renal function daily  Hypokalemia: Recurrent, due to NPO, diuretic. - Continue cautious repletion in IV infusion as needed with CKD. - Also hypomagnesemic (resolved s/p repletion)  - Recheck in AM  Hypernatremia: Resolved.   T2DM: HbA1c 7.1% in Sept 2017.  -  Hold oral medications - Check CBGs q4h while NPO  Altered mental status:  02/21/2016 following start of seroquel for nocturnal delirium. Continues, but improving.  - Hold seroquel and minimize haldol as needed for patient safety/agitation.  - Continue remote sitter prn, D/C mittens, start posey belt as needed for patient safety.  DVT prophylaxis: SCDs Family Communication: Wife at bedside Disposition Plan/Barrier to D/C: D/C to SNF following IV treatment of aspiration pneumonia, weaning to po steroids for adrenal insufficiency, and GI intervention. Will need PT reevaluation. Code Status: Full  IV Access:    Peripheral IV  Procedures and diagnostic studies:   EGD 11/14:  Impression:                - Achalasia esophagus. Dilation in the entire esophagus with stasis and pooled fluid with pill fragments. - Severe esophagitis due to achalasia (reflux, stasis and probable fungal). - Gastritis. - Normal examined duodenum. - Botox to LES was planned, but decision was made not to administer today due to hypotension and concern for developing sepsis.  Recommendation:            - Return patient to hospital ward for ongoing care. - NPO for now. - IV fluids for resuscitation. Blood cultures. Broad spectrum antibiotics in treatment for pneumonia. Fluconazole for fungal esophagitis. - Change medication to IV for now. - Can consider repeat EGD for Botox once condition stabilizes. If Botox fails to improve symptoms, he may need consideration of PEG. Medical Consultants:    Gastroenterology, Dr. Hilarie Fredrickson  Anti-Infectives:   Vancomycin 11/14 >> 11/16 Zosyn 11/14 >>  Diflucan 11/15 >>   Subjective:   Patient and wife at bedside reporting improved mental clarity. Still not completely oriented, but conversant and maintains his personality. He ate clear liquids 100% last night without issues. Denies abdominal pain, N/V. No chest pain, dyspnea.   Objective:    Vitals:   02/25/16 0621 02/25/16 1327 02/25/16 2248 02/26/16 0411  BP: (!) 114/57 111/71 (!) 141/65  122/67  Pulse: 82 85 72 80  Resp: 16 18 (!) 22 18  Temp: 97.8 F (36.6 C) 97.6 F (36.4 C) 97.7 F (36.5 C) 98 F (36.7 C)  TempSrc: Oral Oral Axillary Oral  SpO2: 100% 100% 99% 99%  Weight:      Height:        Intake/Output Summary (Last 24 hours) at 02/26/16 1105 Last data filed at 02/26/16 [id number]  Gross per 24 hour  Intake             2080 ml  Output              125 ml  Net             1955 ml   Filed Weights   02/23/16 0000 02/23/16 2335 02/24/16 0316  Weight: 65.2 kg (143 lb 11.8 oz) 66.3 kg (146 lb 2.6 oz) 66.3 kg (146 lb 2.6 oz)   Exam: General exam: Elderly male in no distress  Respiratory system: Nonlabored, no crackles. Cardiovascular system: RRR, no murmur. No JVD. No edema Gastrointestinal system: Abdomen is nondistended, soft and nontender.  Extremities: No deformities Skin: Stage II partial thickness open ulcer with healthy wound bed. Minimal surrounding erythema.  Psychiatry: More alert and responsive today with some conversation  Data Reviewed:    Labs: Basic Metabolic Panel:  Recent Labs Lab 02/21/16 0339 02/22/16 0307 02/23/16 0312 02/24/16 0302 02/25/16 0341 02/26/16 0533  NA 146* 142 144 145 145 143  K 3.0*  4.5 4.0 3.0* 3.1* 3.5  CL 113* 111 112* 113* 114* 114*  CO2 25 24 24 23  19* 20*  GLUCOSE 104* 286* 85 173* 151* 183*  BUN 57* 53* 50* 45* 44* 47*  CREATININE 2.40* 2.65* 2.74* 2.67* 2.91* 2.80*  CALCIUM 8.0* 7.6* 7.9* 7.6* 7.5* 7.1*  MG 1.2*  --   --  1.4* 1.7 1.7   GFR Estimated Creatinine Clearance: 21.7 mL/min (by C-G formula based on SCr of 2.8 mg/dL (H)). Liver Function Tests:  Recent Labs Lab 02/24/16 0302 02/26/16 0533  AST 42* 19  ALT 15* 12*  ALKPHOS 72 56  BILITOT 0.9 0.5  PROT 4.8* 4.5*  ALBUMIN 1.4* 1.4*   No results for input(s): LIPASE, AMYLASE in the last 168 hours. No results for input(s): AMMONIA in the last 168 hours. Coagulation profile No results for input(s): INR, PROTIME in the last 168  hours.  CBC:  Recent Labs Lab 02/22/16 0307 02/23/16 0312 02/23/16 1605 02/24/16 0302 02/25/16 0341 02/26/16 0533  WBC 8.7 10.8*  --  7.4 17.7* 13.2*  HGB 8.0* 7.6* 8.3* 8.8* 9.1* 9.1*  HCT 24.8* 23.7* 25.3* 26.6* 27.2* 27.7*  MCV 87.6 89.1  --  85.8 85.5 86.3  PLT 322 308  --  319 362 373   Cardiac Enzymes: No results for input(s): CKTOTAL, CKMB, CKMBINDEX, TROPONINI in the last 168 hours. BNP (last 3 results) No results for input(s): PROBNP in the last 8760 hours. CBG:  Recent Labs Lab 02/25/16 1600 02/25/16 2015 02/26/16 0028 02/26/16 0406 02/26/16 0742  GLUCAP 92 157* 165* 184* 150*   D-Dimer: No results for input(s): DDIMER in the last 72 hours. Hgb A1c: No results for input(s): HGBA1C in the last 72 hours. Lipid Profile: No results for input(s): CHOL, HDL, LDLCALC, TRIG, CHOLHDL, LDLDIRECT in the last 72 hours. Thyroid function studies: No results for input(s): TSH, T4TOTAL, T3FREE, THYROIDAB in the last 72 hours.  Invalid input(s): FREET3 Anemia work up: No results for input(s): VITAMINB12, FOLATE, FERRITIN, TIBC, IRON, RETICCTPCT in the last 72 hours. Sepsis Labs:  Recent Labs Lab 02/23/16 0312 02/24/16 0302 02/25/16 0341 02/26/16 0533  WBC 10.8* 7.4 17.7* 13.2*   Microbiology Recent Results (from the past 240 hour(s))  Culture, blood (routine x 2)     Status: None (Preliminary result)   Collection Time: 02/21/16  6:50 AM  Result Value Ref Range Status   Specimen Description BLOOD RIGHT ARM  Final   Special Requests BOTTLES DRAWN AEROBIC ONLY 5CC  Final   Culture   Final    NO GROWTH 4 DAYS Performed at [facility]    Report Status PENDING  Incomplete  Culture, blood (routine x 2)     Status: None (Preliminary result)   Collection Time: 02/21/16  6:51 AM  Result Value Ref Range Status   Specimen Description BLOOD RIGHT HAND  Final   Special Requests BOTTLES DRAWN AEROBIC AND ANAEROBIC 5CC  Final   Culture   Final    NO GROWTH 4  DAYS Performed at [facility]    Report Status PENDING  Incomplete   Medications:   . feeding supplement (ENSURE ENLIVE)  237 mL Oral BID BM  . fluconazole (DIFLUCAN) IV  200 mg Intravenous Q24H  . insulin aspart  0-15 Units Subcutaneous Q4H  . methylPREDNISolone (SOLU-MEDROL) injection  60 mg Intravenous Q12H  . pantoprazole (PROTONIX) IV  40 mg Intravenous Q12H  . piperacillin-tazobactam (ZOSYN)  IV  3.375 g Intravenous Q8H  Continuous Infusions: . sodium chloride 0.45 % 1,000 mL with potassium chloride 40 mEq infusion 75 mL/hr at 02/25/16 [id number]   Time spent: 25 min   LOS: 10 days   Vance Gather, MD  [facility] Pager [phone]  *Please refer to [email or web address], password [email or web address] to get updated schedule on who will round on this patient, as hospitalists switch teams weekly. If 7PM-7AM, please contact night-coverage at [email or web address], password [email or web address] for any overnight needs.  02/26/2016, 11:05 AM

## 2016-02-26 NOTE — Progress Notes (Signed)
Pharmacy Antibiotic Note  [name] is a 75 y.o. male admitted on 02/15/2016 with anemia and GIB.  Pharmacy has been consulted for Vancomycin and Zosyn dosing for sepsis/pneumonia that developed during his EGD on 11/14.  This was narrowed to Zosyn only for presumed aspiration PNA.  Fluconazole added for fungal esophagitis also seen on EGD.  02/26/2016:  Day 5 Zosyn; Day 3 Diflucan  Leukocytosis improving (on steroids)  Scr stable (CrCl ~20-19ml/min)  Afebrile  Blood cx negative (should be finalized later today)  Plan:  Continue Fluconazole 200mg  IV q24h.  Continue Zosyn 3.375g IV Q8H infused over 4hrs.- Consider d/c after EGD tomorrow  Follow up renal fxn, culture results, and clinical course.   Height: 5' 8.5" (174 cm) Weight: 146 lb 2.6 oz (66.3 kg) IBW/kg (Calculated) : 69.55  Temp (24hrs), Avg:97.8 F (36.6 C), Min:97.6 F (36.4 C), Max:98 F (36.7 C)   Recent Labs Lab 02/22/16 0307 02/23/16 0312 02/24/16 0302 02/25/16 0341 02/26/16 0533  WBC 8.7 10.8* 7.4 17.7* 13.2*  CREATININE 2.65* 2.74* 2.67* 2.91* 2.80*    Estimated Creatinine Clearance: 21.7 mL/min (by C-G formula based on SCr of 2.8 mg/dL (H)).    No Known Allergies  Antimicrobials this admission: 11/14 vanc >> 11/16 11/14 zosyn >> 11/16 fluconazole >> (11/29 per GI)  Dose adjustments this admission: 11/16 1300 VT = canceled  Microbiology results: 11/9 MRSA: negative 11/14 bcx x2: ng2d  Thank you for allowing pharmacy to be a part of this patient's care.  Netta Cedars, PharmD, BCPS Pager: [phone] 02/26/2016 11:19 AM

## 2016-02-26 NOTE — Progress Notes (Signed)
Due to anesthesia availability his upper endoscopy with Botox injection to the LES in setting of achalasia will need to be Tuesday morning. Full liquid diet until midnight on Tuesday morning

## 2016-02-27 DIAGNOSIS — K228 Other specified diseases of esophagus: Secondary | ICD-10-CM

## 2016-02-27 LAB — COMPREHENSIVE METABOLIC PANEL
ALT: 12 U/L — AB (ref 17–63)
AST: 19 U/L (ref 15–41)
Albumin: 1.6 g/dL — ABNORMAL LOW (ref 3.5–5.0)
Alkaline Phosphatase: 58 U/L (ref 38–126)
Anion gap: 9 (ref 5–15)
BUN: 46 mg/dL — ABNORMAL HIGH (ref 6–20)
CALCIUM: 7.3 mg/dL — AB (ref 8.9–10.3)
CHLORIDE: 116 mmol/L — AB (ref 101–111)
CO2: 18 mmol/L — ABNORMAL LOW (ref 22–32)
CREATININE: 2.61 mg/dL — AB (ref 0.61–1.24)
GFR, EST AFRICAN AMERICAN: 26 mL/min — AB (ref >60)
GFR, EST NON AFRICAN AMERICAN: 23 mL/min — AB (ref >60)
Glucose, Bld: 121 mg/dL — ABNORMAL HIGH (ref 65–99)
Potassium: 4.6 mmol/L (ref 3.5–5.1)
Sodium: 143 mmol/L (ref 135–145)
Total Bilirubin: 0.9 mg/dL (ref 0.3–1.2)
Total Protein: 4.8 g/dL — ABNORMAL LOW (ref 6.5–8.1)

## 2016-02-27 LAB — CBC
HCT: 28 % — ABNORMAL LOW (ref 39.0–52.0)
Hemoglobin: 9.3 g/dL — ABNORMAL LOW (ref 13.0–17.0)
MCH: 28.6 pg (ref 26.0–34.0)
MCHC: 33.2 g/dL (ref 30.0–36.0)
MCV: 86.2 fL (ref 78.0–100.0)
PLATELETS: 387 10*3/uL (ref 150–400)
RBC: 3.25 MIL/uL — AB (ref 4.22–5.81)
RDW: 16.1 % — ABNORMAL HIGH (ref 11.5–15.5)
WBC: 12.3 10*3/uL — ABNORMAL HIGH (ref 4.0–10.5)

## 2016-02-27 LAB — GLUCOSE, CAPILLARY
GLUCOSE-CAPILLARY: 105 mg/dL — AB (ref 65–99)
GLUCOSE-CAPILLARY: 109 mg/dL — AB (ref 65–99)
GLUCOSE-CAPILLARY: 166 mg/dL — AB (ref 65–99)
Glucose-Capillary: 191 mg/dL — ABNORMAL HIGH (ref 65–99)
Glucose-Capillary: 158 mg/dL — ABNORMAL HIGH (ref 65–99)
Glucose-Capillary: 188 mg/dL — ABNORMAL HIGH (ref 65–99)

## 2016-02-27 LAB — C DIFFICILE QUICK SCREEN W PCR REFLEX
C Diff antigen: NEGATIVE
C Diff interpretation: NOT DETECTED
C Diff toxin: NEGATIVE

## 2016-02-27 MED ORDER — METHYLPREDNISOLONE SODIUM SUCC 40 MG IJ SOLR: 40.0000 mg | Freq: Every day | INTRAMUSCULAR | Status: DC

## 2016-02-27 MED ORDER — DEXTROSE-NACL 5-0.45 % IV SOLN
INTRAVENOUS | Status: AC
  Administered 2016-02-28: via INTRAVENOUS

## 2016-02-27 MED ORDER — BOOST / RESOURCE BREEZE PO LIQD
1.0000 | Freq: Two times a day (BID) | ORAL | Status: DC
  Administered 2016-02-28 – 2016-02-29 (×3): 1 via ORAL

## 2016-02-27 NOTE — Progress Notes (Addendum)
Nutrition Follow-up  DOCUMENTATION CODES:   Not applicable  INTERVENTION:  -Will d/c Ensure Enlive. - Will trial Boost Breeze BID, each supplement provides 250 kcal and 9 grams of protein - Continue to encourage PO intakes of meals and supplements. - RD will continue to monitor for additional nutrition-related needs.  NUTRITION DIAGNOSIS:   Inadequate oral intake related to inability to eat as evidenced by NPO status. -diet advanced to FLD at this time, to be NPO again at midnight.   GOAL:   Patient will meet greater than or equal to 90% of their needs -unmet at this time.  MONITOR:   PO intake, Supplement acceptance, Diet advancement, Weight trends, Labs, Skin, I & O's  ASSESSMENT:   75 y.o. male with history of metastatic prostate cancer, HTN, T2DM, CKD with chronic anemia sent to the ER for hemoglobin of 7 with melanotic stools. FOBT+. Evaluation with EGD 11/10 showed dilated esophagus consistent with achalasia, biopsy showed reactive inflammation without malignancy.Repeat EGD 11/14 again showed severe esophagitis, though botox was not administered due to intraprocedural hypotension and fever concerning for SIRS/sepsis.   11/20 Diet changes as follows: 11/14 @ 0001: NPO 11/18 @ 1107: FLD 11/21 @ 0001: NPO  Per chart review, pt consumed 100% of breakfast and 75% of lunch and dinner yesterday on FLD. Notes indicate that when pt is on FLD and able to receive Ensure, he and/or family is mainly refusing supplement. Will switch to [facility] and assess for tolerance of this supplement.   Pt reports that he had 2 cups of soup for lunch today and cannot recall anything for breakfast other than coffee and orange juice; family members at bedside are unsure of intakes at meals today. MD note from this afternoon states esophagitis and plan for EGD 11/21. Also states that AMS is ongoing but is improving. No new weight since 11/17 but weight -1.9 kg from 11/14-11/17.   Medications  reviewed; sliding scale Novolog, 40 mg IV Solu-medrol x1 dose tomorrow AM, PRN IV Zofran, 40 mg IV Protonix BID.  Labs reviewed; Cl: 116 mmol/L, BUN: 46 mg/dL, creatinine: 2.61 mg/dL, Ca: 7.3 mg/dL, GFR: 26 mL/min.   IVF: 1/2 NS @ 75 mL/hr.    11/17 - Pt has been NPO since 11/14 and previously was on FLD from 11/11-11/14 with documented intakes of 20% of breakfast and lunch on 11/11.  - Pt resting at this time with no family/visitors at bedside.  - Pt has been confused yesterday and today.  - Unable to perform physical assessment at this time; no visible muscle or fat wasting to upper body.  - Per chart review, wt stable over the past 2 months.  - Pt has lost 5 lbs (3% body weight) in 6 months which is not significant for time frame.   Per MD note yesterday afternoon, plan for "d/c to SNF following IV treatment of aspiration pneumonia." No SLP notes this admission.   IVF: D5-1/2 NS-20 mEq KCl @ 75 mL/hr (306 kcal).    Diet Order:  Diet full liquid Room service appropriate? Yes; Fluid consistency: Thin Diet NPO time specified  Skin:  Wound (see comment) (Stage 2 sacrum )  Last BM:  11/19  Height:   Ht Readings from Last 1 Encounters:  02/21/16 5' 8.5" (1.74 m)    Weight:   Wt Readings from Last 1 Encounters:  02/24/16 146 lb 2.6 oz (66.3 kg)    Ideal Body Weight:  71.36 kg  BMI:  Body mass index is 21.9  kg/m.  Estimated Nutritional Needs:   Kcal:  1860-2055 (28-31 kcal/kg)  Protein:  86-99 grams (1.3-1.5 grams/kg)  Fluid:  >/= 1.8 L/day  EDUCATION NEEDS:   No education needs identified at this time    Kevin Matin, MS, RD, LDN Inpatient Clinical Dietitian Pager # [phone] After hours/weekend pager # [phone]

## 2016-02-27 NOTE — Care Management Note (Signed)
Case Management Note  Patient Details  Name: [name] MRN: [id number] Date of Birth: May 12, 1940  Subjective/Objective: 75 y/o m admitted w/Acute GIB. From SNF-[facility] already following. Readmit-10/21-10/26. ENDO botox in am.                   Action/Plan:d/c SNF.   Expected Discharge Date:                  Expected Discharge Plan:  Skilled Nursing Facility  In-House Referral:  Clinical Social Work  Discharge planning Services  CM Consult  Post Acute Care Choice:    Choice offered to:     DME Arranged:    DME Agency:     HH Arranged:    [place] Agency:     Status of Service:  In process, will continue to follow  If discussed at Long Length of Stay Meetings, dates discussed:    Additional Comments:  Dessa Phi, RN 02/27/2016, 1:14 PM

## 2016-02-27 NOTE — Progress Notes (Signed)
[name]   Progress Note  [name]  [id number] DOB: 23-Nov-1940 DOA: 02/15/2016 PCP: [name], FNP   Brief Narrative:   [name] is an 75 y.o. male with history of metastatic prostate cancer, HTN, T2DM, CKD with chronic anemia sent to the ER for hemoglobin of 7 with melanotic stools. FOBT+. Evaluation with EGD 11/10 showed dilated esophagus consistent with achalasia, biopsy showed reactive inflammation without malignancy. Repeat EGD 11/14 again showed severe esophagitis, though botox was not administered due to intraprocedural hypotension and fever concerning for SIRS/sepsis.He was transferred to SDU, started on broad spectrum antibiotics which have been narrowed to zosyn. Fluconazole was empirically started for evidence of candidal esophagitis on EGD. Cortisol checked the next day was 3.9 and stress-steroids were started with abrupt improvement in mental status and SIRS. Antibiotics for possible aspiration continuing. GI to repeat EGD 11/20 with hopes for botox to LES.  Assessment/Plan:   Aspiration pneumonia/pneumonitis vs. adrenal insufficiency: SIRS resolved. Recently treated for HCAP 10/21 - 10/27 based on LLL infiltrate at that time.  - Vanc/zosyn started 11/14 empirically for worsening SIRS with hypotension, and transferred to SDU following EGD, transferred to floor 11/17. D/C'ed vanc as MRSA swab negative and suspicion is for aspiration event. - Blood cultures 11/14 negative.  - Stop date zosyn is 11/21.  ABLA on chronic normocytic anemia of chronic disease due to acute GI bleed: Due to severe esophagitis related to achalasia seen on EGD. No botox 11/14 due to concern for sepsis. Hgb 7 > 9.5 s/p 2u PRBCs dropped recurrently 11/16 7.6 > 8.3 s/p 1u PRBCs. Biopsy 11/10 showed acute reactive inflammation, no malignancy detected. - Still holding anticoagulation - Protonix IV BID - Clear liquid diet, NPO after midnight - EGD held until  anesthesia available for assistance 11/21. - Becoming very weak, will ask for reevaluation by PT.  Candida esophagitis: No esophageal fungal cultures obtained.  - Empiric Tx recommended by GI.  - Fluconazole 11/16 >> 11/29  Adrenal insufficiency: Due to recent discontinuation of prednisone due to hyperglycemia. SIRS occurred following stress of EGD. Cortisol 3.9. Stress steroids started 11/16 with significant improvement in vital signs and mentation.  - Continue taper of stress steroids (solumedrol 80mg  q12h > 60mg  q12h 11/18 > 40mg  q12h 11/19 > 40mg  daily 11/20)   T2DM: worsened by steroid use. HbA1c 7.1% Sept 2017. Holding outpatient glipizide 2.5 mg QAM, tradjenta 5 mg QAM, Actos 30 mg QD - Monitor CBGs q4h, increased SSI to moderate - maintaining inpatient goal. - Monitor very closely for hypoglycemia while tapering steroids  Chronic diastolic CHF: Echo [id number] shows mild LVH, G2DD and IVC appearance suggestive of normal CVP.  - Maintenance IVF to restart while NPO - Holding lasix, will restart after botox, taking po regularly.  Essential hypertension: Normotensive.  - Hold antihypertensives.   History of castration-resistant prostate CA: Followed by Dr. Alen Blew - Hold zytiga - Added Dr. Alen Blew to treatment team. Outpatient follow up was scheduled 11/22  Chronic RLE DVT: Dx at recent hospitalization, put on xarelto, on hold due to GI bleeding. - Actively bleeding now, will awaiting GI recommendations to restart anticoagulation. - Continue to hold lovenox.  CKD stage IV: Suspect 2.5-2.7 is baseline, initially with AKI (Cr 3.65 at admission), has since returned to baseline.  - Hold nephrotoxins, continuing maintenance fluids. Holding lasix. - Monitor renal function daily  Hypokalemia: Recurrent, due to NPO, diuretic. - Continue cautious repletion in IV infusion as needed with CKD. - Also hypomagnesemic (  resolved s/p repletion)  - Recheck in AM  Hypernatremia: Mild, acute.  -  Hypotonic maintenance fluids - Monitor  T2DM: HbA1c 7.1% in Sept 2017.  - Hold oral medications - Check CBGs q4h while NPO  Altered mental status: 02/21/2016 following start of seroquel for nocturnal delirium. Continues, but improving.  - Hold seroquel and minimize haldol as needed for patient safety/agitation.  - Continue remote sitter prn, D/C mittens, start posey belt as needed for patient safety.  DVT prophylaxis: SCDs Family Communication: Wife at bedside Disposition Plan/Barrier to D/C: D/C to SNF following IV treatment of aspiration pneumonia, weaning to po steroids for adrenal insufficiency, and GI intervention. Will need PT reevaluation. Code Status: Full  IV Access:    Peripheral IV  Procedures and diagnostic studies:   EGD 11/14:  Impression:                - Achalasia esophagus. Dilation in the entire esophagus with stasis and pooled fluid with pill fragments. - Severe esophagitis due to achalasia (reflux, stasis and probable fungal). - Gastritis. - Normal examined duodenum. - Botox to LES was planned, but decision was made not to administer today due to hypotension and concern for developing sepsis.  Recommendation:            - Return patient to hospital ward for ongoing care. - NPO for now. - IV fluids for resuscitation. Blood cultures. Broad spectrum antibiotics in treatment for pneumonia. Fluconazole for fungal esophagitis. - Change medication to IV for now. - Can consider repeat EGD for Botox once condition stabilizes. If Botox fails to improve symptoms, he may need consideration of PEG. Medical Consultants:    Gastroenterology, Dr. Hilarie Fredrickson  Anti-Infectives:   Vancomycin 11/14 >> 11/16 Zosyn 11/14 >>  Diflucan 11/15 >>   Subjective:   Patient without complaints. Recalls conversation with GI from hours before accurately. Doing well on clear liquids. Denies abdominal pain, N/V. No chest pain, dyspnea.   Objective:    Vitals:   02/26/16 1431  02/26/16 2049 02/27/16 0456 02/27/16 1259  BP: 120/67 (!) 149/85 120/75 134/71  Pulse: 70 67 75 71  Resp: 20 18 16 16   Temp: 98 F (36.7 C) 97.4 F (36.3 C) 97.8 F (36.6 C) 97.8 F (36.6 C)  TempSrc: Oral Oral Oral Oral  SpO2: 98% 100% 100% 98%  Weight:      Height:        Intake/Output Summary (Last 24 hours) at 02/27/16 1435 Last data filed at 02/27/16 1147  Gross per 24 hour  Intake             1890 ml  Output               75 ml  Net             1815 ml   Filed Weights   02/23/16 0000 02/23/16 2335 02/24/16 0316  Weight: 65.2 kg (143 lb 11.8 oz) 66.3 kg (146 lb 2.6 oz) 66.3 kg (146 lb 2.6 oz)   Exam: General exam: Elderly male in no distress  Respiratory system: Nonlabored, no crackles. Cardiovascular system: RRR, no murmur. No JVD. No edema Gastrointestinal system: Abdomen is nondistended, soft and nontender.  Extremities: No deformities Skin: Stage II partial thickness open ulcer with healthy wound bed. Minimal surrounding erythema.  Psychiatry: Conversant, euthymic with mildly restricted affect  Data Reviewed:    Labs: Basic Metabolic Panel:  Recent Labs Lab 02/21/16 0339  02/23/16 0312 02/24/16 0302 02/25/16 0341 02/26/16  0533 02/27/16 0506  NA 146*  < > 144 145 145 143 143  K 3.0*  < > 4.0 3.0* 3.1* 3.5 4.6  CL 113*  < > 112* 113* 114* 114* 116*  CO2 25  < > 24 23 19* 20* 18*  GLUCOSE 104*  < > 85 173* 151* 183* 121*  BUN 57*  < > 50* 45* 44* 47* 46*  CREATININE 2.40*  < > 2.74* 2.67* 2.91* 2.80* 2.61*  CALCIUM 8.0*  < > 7.9* 7.6* 7.5* 7.1* 7.3*  MG 1.2*  --   --  1.4* 1.7 1.7  --   < > = values in this interval not displayed. GFR Estimated Creatinine Clearance: 23.3 mL/min (by C-G formula based on SCr of 2.61 mg/dL (H)). Liver Function Tests:  Recent Labs Lab 02/24/16 0302 02/26/16 0533 02/27/16 0506  AST 42* 19 19  ALT 15* 12* 12*  ALKPHOS 72 56 58  BILITOT 0.9 0.5 0.9  PROT 4.8* 4.5* 4.8*  ALBUMIN 1.4* 1.4* 1.6*   No results for  input(s): LIPASE, AMYLASE in the last 168 hours. No results for input(s): AMMONIA in the last 168 hours. Coagulation profile No results for input(s): INR, PROTIME in the last 168 hours.  CBC:  Recent Labs Lab 02/23/16 0312 02/23/16 1605 02/24/16 0302 02/25/16 0341 02/26/16 0533 02/27/16 0506  WBC 10.8*  --  7.4 17.7* 13.2* 12.3*  HGB 7.6* 8.3* 8.8* 9.1* 9.1* 9.3*  HCT 23.7* 25.3* 26.6* 27.2* 27.7* 28.0*  MCV 89.1  --  85.8 85.5 86.3 86.2  PLT 308  --  319 362 373 387   Cardiac Enzymes: No results for input(s): CKTOTAL, CKMB, CKMBINDEX, TROPONINI in the last 168 hours. BNP (last 3 results) No results for input(s): PROBNP in the last 8760 hours. CBG:  Recent Labs Lab 02/26/16 2045 02/26/16 2322 02/27/16 0456 02/27/16 0725 02/27/16 1143  GLUCAP 121* 114* 109* 105* 191*   D-Dimer: No results for input(s): DDIMER in the last 72 hours. Hgb A1c: No results for input(s): HGBA1C in the last 72 hours. Lipid Profile: No results for input(s): CHOL, HDL, LDLCALC, TRIG, CHOLHDL, LDLDIRECT in the last 72 hours. Thyroid function studies: No results for input(s): TSH, T4TOTAL, T3FREE, THYROIDAB in the last 72 hours.  Invalid input(s): FREET3 Anemia work up: No results for input(s): VITAMINB12, FOLATE, FERRITIN, TIBC, IRON, RETICCTPCT in the last 72 hours. Sepsis Labs:  Recent Labs Lab 02/24/16 0302 02/25/16 0341 02/26/16 0533 02/27/16 0506  WBC 7.4 17.7* 13.2* 12.3*   Microbiology Recent Results (from the past 240 hour(s))  Culture, blood (routine x 2)     Status: None   Collection Time: 02/21/16  6:50 AM  Result Value Ref Range Status   Specimen Description BLOOD RIGHT ARM  Final   Special Requests BOTTLES DRAWN AEROBIC ONLY 5CC  Final   Culture   Final    NO GROWTH 5 DAYS Performed at [facility]    Report Status 02/26/2016 FINAL  Final  Culture, blood (routine x 2)     Status: None   Collection Time: 02/21/16  6:51 AM  Result Value Ref Range Status    Specimen Description BLOOD RIGHT HAND  Final   Special Requests BOTTLES DRAWN AEROBIC AND ANAEROBIC 5CC  Final   Culture   Final    NO GROWTH 5 DAYS Performed at [facility]    Report Status 02/26/2016 FINAL  Final   Medications:   . feeding supplement (ENSURE ENLIVE)  237 mL  Oral BID BM  . fluconazole (DIFLUCAN) IV  200 mg Intravenous Q24H  . insulin aspart  0-15 Units Subcutaneous Q4H  . methylPREDNISolone (SOLU-MEDROL) injection  40 mg Intravenous Q12H  . pantoprazole (PROTONIX) IV  40 mg Intravenous Q12H  . piperacillin-tazobactam (ZOSYN)  IV  3.375 g Intravenous Q8H   Continuous Infusions: . sodium chloride 0.45 % 1,000 mL with potassium chloride 40 mEq infusion 75 mL/hr at 02/26/16 2330   Time spent: 15 min   LOS: 11 days   Vance Gather, MD  [facility] Pager [phone]  *Please refer to [email or web address], password [email or web address] to get updated schedule on who will round on this patient, as hospitalists switch teams weekly. If 7PM-7AM, please contact night-coverage at [email or web address], password [email or web address] for any overnight needs.  02/27/2016, 2:35 PM

## 2016-02-27 NOTE — Progress Notes (Signed)
   02/27/16 1100  Clinical Encounter Type  Visited With Patient  Visit Type Initial  Referral From Chaplain  Consult/Referral To Chaplain  Spiritual Encounters  Spiritual Needs Prayer  Stress Factors  Patient Stress Factors Health changes  CHP noted patient had been here 11 days and scheduled for surgery tomorrow. CHP visited with patient providing presence, hospitality and prayer for surgery. Roe Coombs 02/27/16

## 2016-02-27 NOTE — Progress Notes (Signed)
      Progress Note   Subjective  Patient has no complaints today. He reports tolerating liquid diet okay. No vomiting. He denies shortness of breath or dyspnea.    Objective   Vital signs in last 24 hours: Temp:  [97.4 F (36.3 C)-98 F (36.7 C)] 97.8 F (36.6 C) (11/20 0456) Pulse Rate:  [67-75] 75 (11/20 0456) Resp:  [16-20] 16 (11/20 0456) BP: (120-149)/(67-85) 120/75 (11/20 0456) SpO2:  [98 %-100 %] 100 % (11/20 0456) Last BM Date: 02/26/16 General:    AA male in NAD Heart:  Regular rate and rhythm; no murmurs Lungs: Respirations even and unlabored Abdomen:  Soft, nontender and nondistended. Normal bowel sounds. Extremities:  Without edema. Neurologic:  Alert and oriented,  grossly normal neurologically. Psych:  Cooperative. Normal mood and affect.  Intake/Output from previous day: 11/19 0701 - 11/20 0700 In: 2370 [P.O.:720; I.V.:1350; IV Piggyback:300] Out: 50 [Urine:50] Intake/Output this shift: No intake/output data recorded.  Lab Results:  Recent Labs  02/25/16 0341 02/26/16 0533 02/27/16 0506  WBC 17.7* 13.2* 12.3*  HGB 9.1* 9.1* 9.3*  HCT 27.2* 27.7* 28.0*  PLT 362 373 387   BMET  Recent Labs  02/25/16 0341 02/26/16 0533 02/27/16 0506  NA 145 143 143  K 3.1* 3.5 4.6  CL 114* 114* 116*  CO2 19* 20* 18*  GLUCOSE 151* 183* 121*  BUN 44* 47* 46*  CREATININE 2.91* 2.80* 2.61*  CALCIUM 7.5* 7.1* 7.3*   LFT  Recent Labs  02/27/16 0506  PROT 4.8*  ALBUMIN 1.6*  AST 19  ALT 12*  ALKPHOS 58  BILITOT 0.9   PT/INR No results for input(s): LABPROT, INR in the last 72 hours.  Studies/Results: No results found.     Assessment / Plan:   75 y/o male with multiple medical problems, admitted with longstanding untreated achalasia, with severe esophagitis, food filled esophagus leading to prolonged EGD to clear the esophagus. His course has been complicated by pneumonia / aspiration, now improved on antibiotics. I have discussed his case with  Dr. Hilarie Fredrickson, the plan that has been in place is to attempt Botox injection of the LES for his symptoms given he is not a good surgical candidate. His case will need to be done with anesthesia assistance, and unfortunately this is not available until tomorrow morning. Patient will continue liquid diet in the interim and ensure he is upright while drinking and 2 hours post ingestion. NPO after MN tonight.   Lancaster Cellar, MD [facility] Pager [phone]

## 2016-02-28 ENCOUNTER — Encounter (HOSPITAL_COMMUNITY)
Admission: EM | Disposition: A | Discharge status: Skilled Nursing Facility | Payer: Self-pay | Source: Home / Self Care | Attending: Family Medicine

## 2016-02-28 ENCOUNTER — Encounter (HOSPITAL_COMMUNITY): Payer: Self-pay | Admitting: *Deleted

## 2016-02-28 ENCOUNTER — Inpatient Hospital Stay (HOSPITAL_COMMUNITY): Payer: Medicare Other | Admitting: Registered Nurse

## 2016-02-28 ENCOUNTER — Other Ambulatory Visit: Payer: Self-pay

## 2016-02-28 HISTORY — PX: ESOPHAGOGASTRODUODENOSCOPY (EGD) WITH PROPOFOL: SHX5813

## 2016-02-28 LAB — CBC
HCT: 29.5 % — ABNORMAL LOW (ref 39.0–52.0)
Hemoglobin: 9.6 g/dL — ABNORMAL LOW (ref 13.0–17.0)
MCH: 28.2 pg (ref 26.0–34.0)
MCHC: 32.5 g/dL (ref 30.0–36.0)
MCV: 86.8 fL (ref 78.0–100.0)
PLATELETS: 444 10*3/uL — AB (ref 150–400)
RBC: 3.4 MIL/uL — AB (ref 4.22–5.81)
RDW: 16.1 % — AB (ref 11.5–15.5)
WBC: 12.7 10*3/uL — ABNORMAL HIGH (ref 4.0–10.5)

## 2016-02-28 LAB — COMPREHENSIVE METABOLIC PANEL
ALT: 11 U/L — AB (ref 17–63)
ANION GAP: 7 (ref 5–15)
AST: 20 U/L (ref 15–41)
Albumin: 1.7 g/dL — ABNORMAL LOW (ref 3.5–5.0)
Alkaline Phosphatase: 60 U/L (ref 38–126)
BUN: 43 mg/dL — ABNORMAL HIGH (ref 6–20)
CHLORIDE: 116 mmol/L — AB (ref 101–111)
CO2: 19 mmol/L — AB (ref 22–32)
CREATININE: 2.61 mg/dL — AB (ref 0.61–1.24)
Calcium: 7.4 mg/dL — ABNORMAL LOW (ref 8.9–10.3)
GFR, EST AFRICAN AMERICAN: 26 mL/min — AB (ref >60)
GFR, EST NON AFRICAN AMERICAN: 23 mL/min — AB (ref >60)
Glucose, Bld: 139 mg/dL — ABNORMAL HIGH (ref 65–99)
POTASSIUM: 4.1 mmol/L (ref 3.5–5.1)
SODIUM: 142 mmol/L (ref 135–145)
Total Bilirubin: 0.8 mg/dL (ref 0.3–1.2)
Total Protein: 4.7 g/dL — ABNORMAL LOW (ref 6.5–8.1)

## 2016-02-28 LAB — GLUCOSE, CAPILLARY
GLUCOSE-CAPILLARY: 124 mg/dL — AB (ref 65–99)
GLUCOSE-CAPILLARY: 70 mg/dL (ref 65–99)
Glucose-Capillary: 124 mg/dL — ABNORMAL HIGH (ref 65–99)
Glucose-Capillary: 159 mg/dL — ABNORMAL HIGH (ref 65–99)
Glucose-Capillary: 182 mg/dL — ABNORMAL HIGH (ref 65–99)
Glucose-Capillary: 86 mg/dL (ref 65–99)

## 2016-02-28 SURGERY — ESOPHAGOGASTRODUODENOSCOPY (EGD) WITH PROPOFOL
Anesthesia: Monitor Anesthesia Care

## 2016-02-28 MED ORDER — SODIUM CHLORIDE 0.9 % IJ SOLN
INTRAMUSCULAR | Status: DC | PRN
  Administered 2016-02-28: 4 mL via SUBMUCOSAL

## 2016-02-28 MED ORDER — PROPOFOL 10 MG/ML IV BOLUS
INTRAVENOUS | Status: AC
  Filled 2016-02-28: qty 40

## 2016-02-28 MED ORDER — PROPOFOL 500 MG/50ML IV EMUL
INTRAVENOUS | Status: DC | PRN
Start: 2016-02-28 — End: 2016-02-28
  Administered 2016-02-28: 300 ug/kg/min via INTRAVENOUS

## 2016-02-28 MED ORDER — METHYLPREDNISOLONE SODIUM SUCC 40 MG IJ SOLR
40.0000 mg | Freq: Every day | INTRAMUSCULAR | Status: DC
  Administered 2016-02-28 – 2016-02-29 (×2): 40 mg via INTRAVENOUS
  Filled 2016-02-28 (×2): qty 1

## 2016-02-28 MED ORDER — PREDNISONE 20 MG PO TABS: 40.0000 mg | ORAL_TABLET | Freq: Every day | ORAL | Status: DC

## 2016-02-28 MED ORDER — ONDANSETRON HCL 4 MG/2ML IJ SOLN
INTRAMUSCULAR | Status: DC | PRN
  Administered 2016-02-28: 4 mg via INTRAVENOUS

## 2016-02-28 MED ORDER — ONABOTULINUMTOXINA 100 UNITS IJ SOLR
INTRAMUSCULAR | Status: AC
  Filled 2016-02-28: qty 100

## 2016-02-28 MED ORDER — SODIUM CHLORIDE 0.9 % IV SOLN
INTRAVENOUS | Status: DC | PRN
Start: 2016-02-28 — End: 2016-02-28
  Administered 2016-02-28: 07:00:00 via INTRAVENOUS

## 2016-02-28 SURGICAL SUPPLY — 14 items

## 2016-02-28 NOTE — Anesthesia Preprocedure Evaluation (Signed)
Anesthesia Evaluation  Patient identified by MRN, date of birth, ID band Patient awake    Reviewed: Allergy & Precautions, NPO status , Patient's Chart, lab work & pertinent test results  Airway Mallampati: II  TM Distance: >3 FB Neck ROM: Full    Dental no notable dental hx.    Pulmonary neg pulmonary ROS, former smoker,    Pulmonary exam normal breath sounds clear to auscultation       Cardiovascular hypertension, Normal cardiovascular exam Rhythm:Regular Rate:Normal     Neuro/Psych negative neurological ROS  negative psych ROS   GI/Hepatic negative GI ROS, Neg liver ROS,   Endo/Other  diabetes  Renal/GU Renal InsufficiencyRenal disease  negative genitourinary   Musculoskeletal negative musculoskeletal ROS (+)   Abdominal   Peds negative pediatric ROS (+)  Hematology negative hematology ROS (+)   Anesthesia Other Findings   Reproductive/Obstetrics negative OB ROS                             Anesthesia Physical Anesthesia Plan  ASA: III  Anesthesia Plan: MAC   Post-op Pain Management:    Induction: Intravenous  Airway Management Planned: Nasal Cannula  Additional Equipment:   Intra-op Plan:   Post-operative Plan:   Informed Consent: I have reviewed the patients History and Physical, chart, labs and discussed the procedure including the risks, benefits and alternatives for the proposed anesthesia with the patient or authorized representative who has indicated his/her understanding and acceptance.   Dental advisory given  Plan Discussed with: CRNA and Surgeon  Anesthesia Plan Comments:         Anesthesia Quick Evaluation

## 2016-02-28 NOTE — Transfer of Care (Signed)
Immediate Anesthesia Transfer of Care Note  Patient: [name]  Procedure(s) Performed: Procedure(s): ESOPHAGOGASTRODUODENOSCOPY (EGD) WITH PROPOFOL withBOTOX (N/A)  Patient Location: PACU  Anesthesia Type:MAC  Level of Consciousness: sedated, patient cooperative and responds to stimulation  Airway & Oxygen Therapy: Patient Spontanous Breathing and Patient connected to nasal cannula oxygen  Post-op Assessment: Report given to RN, Post -op Vital signs reviewed and stable and Patient moving all extremities X 4  Post vital signs: stable  Last Vitals:  Vitals:   02/28/16 0715 02/28/16 0805  BP: 122/71 (!) 144/91  Pulse: 86 97  Resp: 20 20  Temp: 36.4 C     Last Pain:  Vitals:   02/28/16 0805  TempSrc: Oral  PainSc:       Patients Stated Pain Goal: 3 ([id number])  Complications: No apparent anesthesia complications

## 2016-02-28 NOTE — Interval H&P Note (Signed)
History and Physical Interval Note:  02/28/2016 7:20 AM  [name]  has presented today for surgery, with the diagnosis of Achalasia  The various methods of treatment have been discussed with the patient and family. After consideration of risks, benefits and other options for treatment, the patient has consented to  Procedure(s): ESOPHAGOGASTRODUODENOSCOPY (EGD) WITH PROPOFOL withBOTOX (N/A) as a surgical intervention .  The patient's history has been reviewed, patient examined, no change in status, stable for surgery.  I have reviewed the patient's chart and labs.  Questions were answered to the patient's satisfaction.     Renelda Loma [name]

## 2016-02-28 NOTE — Anesthesia Postprocedure Evaluation (Signed)
Anesthesia Post Note  Patient: [name]  Procedure(s) Performed: Procedure(s) (LRB): ESOPHAGOGASTRODUODENOSCOPY (EGD) WITH PROPOFOL withBOTOX (N/A)  Patient location during evaluation: PACU Anesthesia Type: MAC Level of consciousness: awake and alert Pain management: pain level controlled Vital Signs Assessment: post-procedure vital signs reviewed and stable Respiratory status: spontaneous breathing, nonlabored ventilation, respiratory function stable and patient connected to nasal cannula oxygen Cardiovascular status: stable and blood pressure returned to baseline Anesthetic complications: no    Last Vitals:  Vitals:   02/28/16 0810 02/28/16 0820  BP: (!) 155/93 (!) (P) 167/84  Pulse: 70   Resp: 16 (P) 15  Temp:      Last Pain:  Vitals:   02/28/16 0805  TempSrc: Oral  PainSc:                  Shawnta Schlegel S

## 2016-02-28 NOTE — Progress Notes (Signed)
[name]   Progress Note  [name]  [id number] DOB: Oct 19, 1940 DOA: 02/15/2016 PCP: [name], FNP   Brief Narrative:   [name] is an 75 y.o. male with history of metastatic prostate cancer, HTN, T2DM, CKD with chronic anemia sent to the ER for hemoglobin of 7 with melanotic stools. FOBT+. Evaluation with EGD 11/10 showed dilated esophagus consistent with achalasia, biopsy showed reactive inflammation without malignancy. Repeat EGD 11/14 again showed severe esophagitis, though botox was not administered due to intraprocedural hypotension and fever concerning for SIRS/sepsis.He was transferred to SDU, started on broad spectrum antibiotics which have been narrowed to zosyn. Fluconazole was empirically started for evidence of candidal esophagitis on EGD. Cortisol checked the next day was 3.9 and stress-steroids were started with abrupt improvement in mental status and SIRS. Antibiotics for possible aspiration continuing. Repeat EGD 11/21 with successful injection of botox into LES.   Assessment/Plan:   Aspiration pneumonia/pneumonitis vs. adrenal insufficiency: SIRS resolved. Recently treated for HCAP 10/21 - 10/27 based on LLL infiltrate at that time.  - Vanc/zosyn started 11/14 empirically for worsening SIRS with hypotension, and transferred to SDU following EGD > Stop date 11/21.  - Blood cultures 11/14 negative.   ABLA on anemia of chronic disease due to acute GI bleed: Due to severe esophagitis related to achalasia seen on EGD. Hgb 7 > 9.5 s/p 2u PRBCs dropped recurrently 11/16 7.6 > 8.3 s/p 1u PRBCs. Biopsy 11/10 showed acute reactive inflammation, no malignancy detected. - Still holding anticoagulation, per GI - Protonix IV BID: Improved esophagitis noted on EGD 11/21 - Clear liquid diet, advance per GI  - Becoming very weak, will ask for reevaluation by PT.  Achalasia: Chronic, Dx in 1990's. No botox 11/14 due to concern for sepsis.  - EGD  11/21: botox injected into LES  Candida esophagitis: No esophageal fungal cultures obtained. Empiric Tx recommended by GI.  - Fluconazole 11/16 >> 11/29  Adrenal insufficiency: Due to recent discontinuation of prednisone due to hyperglycemia. SIRS occurred following stress of EGD. Cortisol 3.9. Stress steroids started 11/16 with significant improvement in vital signs and mentation.  - Continue taper of stress steroids (solumedrol 80mg  q12h > 60mg  q12h 11/18 > 40mg  q12h 11/19 > 40mg  daily 11/20 >> will keep there given AM relative hypotension and procedure)   T2DM: worsened by steroid use. HbA1c 7.1% Sept 2017. Holding outpatient glipizide 2.5 mg QAM, tradjenta 5 mg QAM, Actos 30 mg QD - Monitor CBGs q4h, increased SSI to moderate - maintaining inpatient goal. - Monitor very closely for hypoglycemia while tapering steroids  Chronic diastolic CHF: Echo [id number] shows mild LVH, G2DD and IVC appearance suggestive of normal CVP.  - Maintenance IVF to restart while NPO - Holding lasix, will restart after botox, taking po regularly.  Essential hypertension: Normotensive.  - Hold antihypertensives.   History of castration-resistant prostate CA: Followed by Dr. Alen Blew - Hold zytiga - Added Dr. Alen Blew to treatment team. Outpatient follow up was scheduled 11/22  Chronic RLE DVT: Dx at recent hospitalization, put on xarelto, on hold due to GI bleeding. - Awaiting GI recommendations to restart anticoagulation.  CKD stage IV: Suspect 2.5-2.7 is baseline, initially with AKI (Cr 3.65 at admission), has since returned to baseline.  - Hold nephrotoxins, continuing maintenance fluids. Holding lasix. - Monitor renal function daily  Hypokalemia: Recurrent, due to NPO, diuretic. - Continue cautious repletion in IV infusion as needed with CKD. - Also hypomagnesemic (resolved s/p repletion)  - Recheck  in AM  Hypernatremia: Mild, acute.  - Hypotonic maintenance fluids - Monitor  T2DM: HbA1c 7.1% in Sept  2017.  - Hold oral medications - Check CBGs q4h while NPO  Altered mental status: 02/21/2016 following start of seroquel for nocturnal delirium. Improving. - Hold seroquel and minimize haldol as needed for patient safety/agitation.  - D/C sitter.  DVT prophylaxis: SCDs Family Communication: Best firned at bedside per pt request. Disposition Plan/Barrier to D/C: D/C to SNF pending clinical improvement. Will need PT reevaluation. Code Status: Full  IV Access:    Peripheral IV  Procedures and diagnostic studies:   EGD 02/21/2016:  Impression:                - Achalasia esophagus. Dilation in the entire esophagus with stasis and pooled fluid with pill fragments. - Severe esophagitis due to achalasia (reflux, stasis and probable fungal). - Gastritis. - Normal examined duodenum. - Botox to LES was planned, but decision was made not to administer today due to hypotension and concern for developing sepsis.  Recommendation:            - Return patient to hospital ward for ongoing care. - NPO for now. - IV fluids for resuscitation. Blood cultures. Broad spectrum antibiotics in treatment for pneumonia. Fluconazole for fungal esophagitis. - Change medication to IV for now. - Can consider repeat EGD for Botox once condition stabilizes. If Botox fails to improve symptoms, he may need consideration of PEG.  EGD 02/28/2016: Impression:                - Esophagogastric landmarks identified. - Dilation in the upper third of the esophagus and in the middle third of the esophagus, with residual liquid contents needing suctioning. Previously noted esophagitis appeared improved. - A large amount of food (residue) in the stomach. - An area in the lower esophageal sphincter successfully injected with botulinum toxin as outlined above. - No specimens collected.  Recommendation:            - Return patient to hospital ward for ongoing care. - Patient should be upright most of the time while in bed  to minimize fluid retention in the esophagus - Resume liquid diet this afternoon - Continue present medications - GI service will continue to follow  Medical Consultants:    Gastroenterology, Dr. Hilarie Fredrickson  Anti-Infectives:   Vancomycin 11/14 >> 11/16 Zosyn 11/14 >> 11/21 Diflucan 11/15 >> 11/29  Subjective:   Patient without complaints s/p procedure, ready to eat. Denies abdominal pain, N/V. No chest pain, dyspnea.   Objective:    Vitals:   02/28/16 0805 02/28/16 0810 02/28/16 0820 02/28/16 0830  BP: (!) 144/91 (!) 155/93 (!) 167/84 (!) 163/83  Pulse: 97 70  71  Resp: 20 16 15    Temp: 97.6 F (36.4 C)     TempSrc: Oral     SpO2: 100% 100%  98%  Weight:      Height:        Intake/Output Summary (Last 24 hours) at 02/28/16 1028 Last data filed at 02/28/16 0927  Gross per 24 hour  Intake             2700 ml  Output              275 ml  Net             2425 ml   Filed Weights   02/23/16 2335 02/24/16 0316 02/28/16 0715  Weight: 66.3 kg (146 lb 2.6 oz)  66.3 kg (146 lb 2.6 oz) 66.2 kg (146 lb)   Exam: General exam: Elderly male in no distress  Respiratory system: Nonlabored, no crackles. Cardiovascular system: RRR, no murmur. No JVD. No edema Gastrointestinal system: Abdomen is nondistended, soft and nontender.  Extremities: No deformities, RLE with trace pitting edema, nontender. Skin: Stage II partial thickness open ulcer with healthy wound bed. Minimal surrounding erythema.  Neuro: Alert and oriented, no focal deficits. Psychiatry: Conversant, euthymic with mildly restricted affect  Data Reviewed:    Labs: Basic Metabolic Panel:  Recent Labs Lab 02/24/16 0302 02/25/16 0341 02/26/16 0533 02/27/16 0506 02/28/16 0351  NA 145 145 143 143 142  K 3.0* 3.1* 3.5 4.6 4.1  CL 113* 114* 114* 116* 116*  CO2 23 19* 20* 18* 19*  GLUCOSE 173* 151* 183* 121* 139*  BUN 45* 44* 47* 46* 43*  CREATININE 2.67* 2.91* 2.80* 2.61* 2.61*  CALCIUM 7.6* 7.5* 7.1* 7.3*  7.4*  MG 1.4* 1.7 1.7  --   --    GFR Estimated Creatinine Clearance: 23.3 mL/min (by C-G formula based on SCr of 2.61 mg/dL (H)). Liver Function Tests:  Recent Labs Lab 02/24/16 0302 02/26/16 0533 02/27/16 0506 02/28/16 0351  AST 42* 19 19 20   ALT 15* 12* 12* 11*  ALKPHOS 72 56 58 60  BILITOT 0.9 0.5 0.9 0.8  PROT 4.8* 4.5* 4.8* 4.7*  ALBUMIN 1.4* 1.4* 1.6* 1.7*   No results for input(s): LIPASE, AMYLASE in the last 168 hours. No results for input(s): AMMONIA in the last 168 hours. Coagulation profile No results for input(s): INR, PROTIME in the last 168 hours.  CBC:  Recent Labs Lab 02/24/16 0302 02/25/16 0341 02/26/16 0533 02/27/16 0506 02/28/16 0351  WBC 7.4 17.7* 13.2* 12.3* 12.7*  HGB 8.8* 9.1* 9.1* 9.3* 9.6*  HCT 26.6* 27.2* 27.7* 28.0* 29.5*  MCV 85.8 85.5 86.3 86.2 86.8  PLT 319 362 373 387 444*   Cardiac Enzymes: No results for input(s): CKTOTAL, CKMB, CKMBINDEX, TROPONINI in the last 168 hours. BNP (last 3 results) No results for input(s): PROBNP in the last 8760 hours. CBG:  Recent Labs Lab 02/27/16 1604 02/27/16 2055 02/27/16 2354 02/28/16 0358 02/28/16 0849  GLUCAP 158* 188* 166* 124* 70   D-Dimer: No results for input(s): DDIMER in the last 72 hours. Hgb A1c: No results for input(s): HGBA1C in the last 72 hours. Lipid Profile: No results for input(s): CHOL, HDL, LDLCALC, TRIG, CHOLHDL, LDLDIRECT in the last 72 hours. Thyroid function studies: No results for input(s): TSH, T4TOTAL, T3FREE, THYROIDAB in the last 72 hours.  Invalid input(s): FREET3 Anemia work up: No results for input(s): VITAMINB12, FOLATE, FERRITIN, TIBC, IRON, RETICCTPCT in the last 72 hours. Sepsis Labs:  Recent Labs Lab 02/25/16 0341 02/26/16 0533 02/27/16 0506 02/28/16 0351  WBC 17.7* 13.2* 12.3* 12.7*   Microbiology Recent Results (from the past 240 hour(s))  Culture, blood (routine x 2)     Status: None   Collection Time: 02/21/16  6:50 AM  Result  Value Ref Range Status   Specimen Description BLOOD RIGHT ARM  Final   Special Requests BOTTLES DRAWN AEROBIC ONLY 5CC  Final   Culture   Final    NO GROWTH 5 DAYS Performed at [facility]    Report Status 02/26/2016 FINAL  Final  Culture, blood (routine x 2)     Status: None   Collection Time: 02/21/16  6:51 AM  Result Value Ref Range Status   Specimen Description BLOOD RIGHT HAND  Final   Special Requests BOTTLES DRAWN AEROBIC AND ANAEROBIC 5CC  Final   Culture   Final    NO GROWTH 5 DAYS Performed at [facility]    Report Status 02/26/2016 FINAL  Final  C difficile quick scan w PCR reflex     Status: None   Collection Time: 02/27/16  4:50 PM  Result Value Ref Range Status   C Diff antigen NEGATIVE NEGATIVE Final   C Diff toxin NEGATIVE NEGATIVE Final   C Diff interpretation No C. difficile detected.  Final   Medications:   . feeding supplement  1 Container Oral BID BM  . fluconazole (DIFLUCAN) IV  200 mg Intravenous Q24H  . insulin aspart  0-15 Units Subcutaneous Q4H  . methylPREDNISolone (SOLU-MEDROL) injection  40 mg Intravenous Daily  . pantoprazole (PROTONIX) IV  40 mg Intravenous Q12H   Continuous Infusions: . dextrose 5 % and 0.45% NaCl 75 mL/hr at 02/28/16 0000  . sodium chloride 0.45 % 1,000 mL with potassium chloride 40 mEq infusion Stopped (02/28/16 0000)   Time spent: 15 min   LOS: 12 days   Vance Gather, MD  [facility] Pager [phone]  *Please refer to [email or web address], password [email or web address] to get updated schedule on who will round on this patient, as hospitalists switch teams weekly. If 7PM-7AM, please contact night-coverage at [email or web address], password [email or web address] for any overnight needs.  02/28/2016, 10:28 AM

## 2016-02-28 NOTE — H&P (View-Only) (Signed)
      Progress Note   Subjective  Patient has no complaints today. He reports tolerating liquid diet okay. No vomiting. He denies shortness of breath or dyspnea.    Objective   Vital signs in last 24 hours: Temp:  [97.4 F (36.3 C)-98 F (36.7 C)] 97.8 F (36.6 C) (11/20 0456) Pulse Rate:  [67-75] 75 (11/20 0456) Resp:  [16-20] 16 (11/20 0456) BP: (120-149)/(67-85) 120/75 (11/20 0456) SpO2:  [98 %-100 %] 100 % (11/20 0456) Last BM Date: 02/26/16 General:    AA male in NAD Heart:  Regular rate and rhythm; no murmurs Lungs: Respirations even and unlabored Abdomen:  Soft, nontender and nondistended. Normal bowel sounds. Extremities:  Without edema. Neurologic:  Alert and oriented,  grossly normal neurologically. Psych:  Cooperative. Normal mood and affect.  Intake/Output from previous day: 11/19 0701 - 11/20 0700 In: 2370 [P.O.:720; I.V.:1350; IV Piggyback:300] Out: 50 [Urine:50] Intake/Output this shift: No intake/output data recorded.  Lab Results:  Recent Labs  02/25/16 0341 02/26/16 0533 02/27/16 0506  WBC 17.7* 13.2* 12.3*  HGB 9.1* 9.1* 9.3*  HCT 27.2* 27.7* 28.0*  PLT 362 373 387   BMET  Recent Labs  02/25/16 0341 02/26/16 0533 02/27/16 0506  NA 145 143 143  K 3.1* 3.5 4.6  CL 114* 114* 116*  CO2 19* 20* 18*  GLUCOSE 151* 183* 121*  BUN 44* 47* 46*  CREATININE 2.91* 2.80* 2.61*  CALCIUM 7.5* 7.1* 7.3*   LFT  Recent Labs  02/27/16 0506  PROT 4.8*  ALBUMIN 1.6*  AST 19  ALT 12*  ALKPHOS 58  BILITOT 0.9   PT/INR No results for input(s): LABPROT, INR in the last 72 hours.  Studies/Results: No results found.     Assessment / Plan:   75 y/o male with multiple medical problems, admitted with longstanding untreated achalasia, with severe esophagitis, food filled esophagus leading to prolonged EGD to clear the esophagus. His course has been complicated by pneumonia / aspiration, now improved on antibiotics. I have discussed his case with  Dr. Hilarie Fredrickson, the plan that has been in place is to attempt Botox injection of the LES for his symptoms given he is not a good surgical candidate. His case will need to be done with anesthesia assistance, and unfortunately this is not available until tomorrow morning. Patient will continue liquid diet in the interim and ensure he is upright while drinking and 2 hours post ingestion. NPO after MN tonight.   Glen Echo Cellar, MD [facility] Pager [phone]

## 2016-02-28 NOTE — Progress Notes (Signed)
PT Cancellation Note  Patient Details Name: LASEAN BOWLEN MRN: [id number] DOB: 18-Dec-1940   Cancelled Treatment:    Reason Eval/Treat Not Completed: Patient at procedure or test/unavailable   Claretha Cooper 02/28/2016, 7:23 AM Tresa Endo PT [phone]

## 2016-02-28 NOTE — Progress Notes (Signed)
CSW following for return to [facility] when medically ready. CSW has completed FL2 & will continue to follow and assist with return.    Johnica Armwood, LCSW [facility] Clinical Social Worker cell #: [phone]  

## 2016-02-28 NOTE — Op Note (Signed)
[facility] Patient Name: [name] Procedure Date: 02/28/2016 MRN: [id number] Attending MD: Carlota Raspberry. Armbruster MD, MD Date of Birth: 13-Jun-1940 CSN: [id number] Age: 75 Admit Type: Inpatient Procedure:                Upper GI endoscopy Indications:              For botulinum toxin injection of achalasia, history                            of aspiration pneumonia from achalasia Providers:                Carlota Raspberry. Armbruster MD, MD, Hilma Favors, RN,                            Elspeth Cho Tech., Technician, Enrigue Catena, CRNA Referring MD:              Medicines:                Monitored Anesthesia Care Complications:            No immediate complications. Estimated blood loss:                            Minimal. Estimated Blood Loss:     Estimated blood loss was minimal. Procedure:                Pre-Anesthesia Assessment:                           - Prior to the procedure, a History and Physical                            was performed, and patient medications and                            allergies were reviewed. The patient's tolerance of                            previous anesthesia was also reviewed. The risks                            and benefits of the procedure and the sedation                            options and risks were discussed with the patient.                            All questions were answered, and informed consent                            was obtained. Prior Anticoagulants: The patient has                            taken no previous anticoagulant or antiplatelet  agents. ASA Grade Assessment: III - A patient with                            severe systemic disease. After reviewing the risks                            and benefits, the patient was deemed in                            satisfactory condition to undergo the procedure.                           After obtaining informed consent, the endoscope  was                            passed under direct vision. Throughout the                            procedure, the patient's blood pressure, pulse, and                            oxygen saturations were monitored continuously. The                            Endoscope was introduced through the mouth, and                            advanced to the antrum of the stomach. The upper GI                            endoscopy was accomplished without difficulty. The                            patient tolerated the procedure well. Scope In: Scope Out: Findings:      Esophagogastric landmarks were identified: the gastroesophageal junction       was found at 42 cm from the incisors. There was an area of what       appearing to be healing esophagitis at the GEJ - previously biopsied and       benign inflammatory tissue without Barrett's      The lumen of the upper third of the esophagus and middle third of the       esophagus was severely dilated and with liquid contents that required       suctioning. The previously noted esophagitis appeared significantly       improved on PPI.      Within 1cm of the SCJ, the esophagus was successfully injected with 100       units botulinum toxin (4 injections total in 4 quadrants, 25 units each       injection).      A large amount of food (residue) was found in the gastric body and       fundus, the procedure was aborted after LES injection and remainder of       upper tract not visualized due to food in the stomach, Impression:               -  Esophagogastric landmarks identified.                           - Dilation in the upper third of the esophagus and                            in the middle third of the esophagus, with residual                            liquid contents needing suctioning. Previously                            noted esophagitis appeared improved.                           - A large amount of food (residue) in the stomach.                            - An area in the lower esophageal sphincter                            successfully injected with botulinum toxin as                            outlined above.                           - No specimens collected. Moderate Sedation:      No moderate sedation, case performed with MAC Recommendation:           - Return patient to hospital ward for ongoing care.                           - Patient should be upright most of the time while                            in bed to minimize fluid retention in the esophagus                           - Resume liquid diet this afternoon                           - Continue present medications                           - GI service will continue to follow Procedure Code(s):        --- Professional ---                           43236, 52, Esophagogastroduodenoscopy, flexible,                            transoral; with directed submucosal injection(s),  any substance Diagnosis Code(s):        --- Professional ---                           K22.8, Other specified diseases of esophagus                           K22.0, Achalasia of cardia CPT copyright 2016 [employer] Medical [employer]. All rights reserved. The codes documented in this report are preliminary and upon coder review may  be revised to meet current compliance requirements. Remo Lipps P. Armbruster MD, MD 02/28/2016 8:06:00 AM This report has been signed electronically. Number of Addenda: 0

## 2016-02-28 NOTE — Consult Note (Signed)
   [facility] CM Inpatient Consult   02/28/2016  TRENTAN MALHI 1940/09/30 [id number]    [facility] - Inpatient Care Management screening follow up. Chart reviewed. Noted discharge plans remain SNF. No identifiable [facility] Care Management needs at this time.   Marthenia Rolling, MSN-Ed, RN,BSN [facility] Liaison [phone]

## 2016-02-29 ENCOUNTER — Ambulatory Visit: Payer: Medicare Other | Admitting: Oncology

## 2016-02-29 ENCOUNTER — Encounter: Payer: Self-pay | Admitting: Medical Oncology

## 2016-02-29 ENCOUNTER — Other Ambulatory Visit: Payer: Medicare Other

## 2016-02-29 ENCOUNTER — Encounter (HOSPITAL_COMMUNITY): Payer: Self-pay | Admitting: Gastroenterology

## 2016-02-29 DIAGNOSIS — K220 Achalasia of cardia: Secondary | ICD-10-CM | POA: Diagnosis not present

## 2016-02-29 DIAGNOSIS — M6281 Muscle weakness (generalized): Secondary | ICD-10-CM | POA: Diagnosis not present

## 2016-02-29 DIAGNOSIS — I472 Ventricular tachycardia: Secondary | ICD-10-CM | POA: Diagnosis not present

## 2016-02-29 DIAGNOSIS — I1310 Hypertensive heart and chronic kidney disease without heart failure, with stage 1 through stage 4 chronic kidney disease, or unspecified chronic kidney disease: Secondary | ICD-10-CM | POA: Diagnosis not present

## 2016-02-29 DIAGNOSIS — E876 Hypokalemia: Secondary | ICD-10-CM | POA: Diagnosis not present

## 2016-02-29 DIAGNOSIS — R1314 Dysphagia, pharyngoesophageal phase: Secondary | ICD-10-CM | POA: Diagnosis not present

## 2016-02-29 DIAGNOSIS — E46 Unspecified protein-calorie malnutrition: Secondary | ICD-10-CM | POA: Diagnosis not present

## 2016-02-29 DIAGNOSIS — Z87891 Personal history of nicotine dependence: Secondary | ICD-10-CM | POA: Diagnosis not present

## 2016-02-29 DIAGNOSIS — I82419 Acute embolism and thrombosis of unspecified femoral vein: Secondary | ICD-10-CM | POA: Diagnosis not present

## 2016-02-29 DIAGNOSIS — E2740 Unspecified adrenocortical insufficiency: Secondary | ICD-10-CM | POA: Diagnosis not present

## 2016-02-29 DIAGNOSIS — E1122 Type 2 diabetes mellitus with diabetic chronic kidney disease: Secondary | ICD-10-CM | POA: Diagnosis not present

## 2016-02-29 DIAGNOSIS — N183 Chronic kidney disease, stage 3 (moderate): Secondary | ICD-10-CM | POA: Diagnosis present

## 2016-02-29 DIAGNOSIS — Z7901 Long term (current) use of anticoagulants: Secondary | ICD-10-CM | POA: Diagnosis not present

## 2016-02-29 DIAGNOSIS — J189 Pneumonia, unspecified organism: Secondary | ICD-10-CM | POA: Diagnosis not present

## 2016-02-29 DIAGNOSIS — E119 Type 2 diabetes mellitus without complications: Secondary | ICD-10-CM | POA: Diagnosis not present

## 2016-02-29 DIAGNOSIS — N179 Acute kidney failure, unspecified: Secondary | ICD-10-CM | POA: Diagnosis not present

## 2016-02-29 DIAGNOSIS — E784 Other hyperlipidemia: Secondary | ICD-10-CM | POA: Diagnosis not present

## 2016-02-29 DIAGNOSIS — E860 Dehydration: Secondary | ICD-10-CM | POA: Diagnosis not present

## 2016-02-29 DIAGNOSIS — K921 Melena: Secondary | ICD-10-CM | POA: Diagnosis not present

## 2016-02-29 DIAGNOSIS — R2689 Other abnormalities of gait and mobility: Secondary | ICD-10-CM | POA: Diagnosis not present

## 2016-02-29 DIAGNOSIS — Z794 Long term (current) use of insulin: Secondary | ICD-10-CM | POA: Diagnosis not present

## 2016-02-29 DIAGNOSIS — K922 Gastrointestinal hemorrhage, unspecified: Secondary | ICD-10-CM | POA: Diagnosis not present

## 2016-02-29 DIAGNOSIS — K209 Esophagitis, unspecified: Secondary | ICD-10-CM | POA: Diagnosis not present

## 2016-02-29 DIAGNOSIS — R195 Other fecal abnormalities: Secondary | ICD-10-CM | POA: Diagnosis not present

## 2016-02-29 DIAGNOSIS — D62 Acute posthemorrhagic anemia: Secondary | ICD-10-CM | POA: Diagnosis not present

## 2016-02-29 DIAGNOSIS — E785 Hyperlipidemia, unspecified: Secondary | ICD-10-CM | POA: Diagnosis not present

## 2016-02-29 DIAGNOSIS — D500 Iron deficiency anemia secondary to blood loss (chronic): Secondary | ICD-10-CM | POA: Diagnosis not present

## 2016-02-29 DIAGNOSIS — Z8249 Family history of ischemic heart disease and other diseases of the circulatory system: Secondary | ICD-10-CM | POA: Diagnosis not present

## 2016-02-29 DIAGNOSIS — Z923 Personal history of irradiation: Secondary | ICD-10-CM | POA: Diagnosis not present

## 2016-02-29 DIAGNOSIS — L89322 Pressure ulcer of left buttock, stage 2: Secondary | ICD-10-CM | POA: Diagnosis not present

## 2016-02-29 DIAGNOSIS — B3781 Candidal esophagitis: Secondary | ICD-10-CM | POA: Diagnosis not present

## 2016-02-29 DIAGNOSIS — I10 Essential (primary) hypertension: Secondary | ICD-10-CM | POA: Diagnosis not present

## 2016-02-29 DIAGNOSIS — C61 Malignant neoplasm of prostate: Secondary | ICD-10-CM | POA: Diagnosis not present

## 2016-02-29 DIAGNOSIS — Z833 Family history of diabetes mellitus: Secondary | ICD-10-CM | POA: Diagnosis not present

## 2016-02-29 DIAGNOSIS — D649 Anemia, unspecified: Secondary | ICD-10-CM | POA: Diagnosis not present

## 2016-02-29 DIAGNOSIS — L89152 Pressure ulcer of sacral region, stage 2: Secondary | ICD-10-CM

## 2016-02-29 DIAGNOSIS — E870 Hyperosmolality and hypernatremia: Secondary | ICD-10-CM | POA: Diagnosis not present

## 2016-02-29 DIAGNOSIS — K219 Gastro-esophageal reflux disease without esophagitis: Secondary | ICD-10-CM | POA: Diagnosis not present

## 2016-02-29 DIAGNOSIS — K228 Other specified diseases of esophagus: Secondary | ICD-10-CM | POA: Diagnosis not present

## 2016-02-29 DIAGNOSIS — I5032 Chronic diastolic (congestive) heart failure: Secondary | ICD-10-CM | POA: Diagnosis not present

## 2016-02-29 DIAGNOSIS — F0390 Unspecified dementia without behavioral disturbance: Secondary | ICD-10-CM | POA: Diagnosis present

## 2016-02-29 DIAGNOSIS — R531 Weakness: Secondary | ICD-10-CM | POA: Diagnosis not present

## 2016-02-29 DIAGNOSIS — I82501 Chronic embolism and thrombosis of unspecified deep veins of right lower extremity: Secondary | ICD-10-CM | POA: Diagnosis not present

## 2016-02-29 LAB — BASIC METABOLIC PANEL
ANION GAP: 6 (ref 5–15)
BUN: 35 mg/dL — AB (ref 6–20)
CALCIUM: 7.7 mg/dL — AB (ref 8.9–10.3)
CO2: 20 mmol/L — AB (ref 22–32)
Chloride: 117 mmol/L — ABNORMAL HIGH (ref 101–111)
Creatinine, Ser: 2.37 mg/dL — ABNORMAL HIGH (ref 0.61–1.24)
GFR calc Af Amer: 29 mL/min — ABNORMAL LOW (ref >60)
GFR, EST NON AFRICAN AMERICAN: 25 mL/min — AB (ref >60)
GLUCOSE: 112 mg/dL — AB (ref 65–99)
Potassium: 4.9 mmol/L (ref 3.5–5.1)
Sodium: 143 mmol/L (ref 135–145)

## 2016-02-29 LAB — CBC
HEMATOCRIT: 32.6 % — AB (ref 39.0–52.0)
Hemoglobin: 10.5 g/dL — ABNORMAL LOW (ref 13.0–17.0)
MCH: 28.2 pg (ref 26.0–34.0)
MCHC: 32.2 g/dL (ref 30.0–36.0)
MCV: 87.4 fL (ref 78.0–100.0)
PLATELETS: 520 10*3/uL — AB (ref 150–400)
RBC: 3.73 MIL/uL — ABNORMAL LOW (ref 4.22–5.81)
RDW: 16.2 % — AB (ref 11.5–15.5)
WBC: 12.2 10*3/uL — AB (ref 4.0–10.5)

## 2016-02-29 LAB — GLUCOSE, CAPILLARY
GLUCOSE-CAPILLARY: 97 mg/dL (ref 65–99)
Glucose-Capillary: 75 mg/dL (ref 65–99)
Glucose-Capillary: 127 mg/dL — ABNORMAL HIGH (ref 65–99)
Glucose-Capillary: 263 mg/dL — ABNORMAL HIGH (ref 65–99)

## 2016-02-29 MED ORDER — HYDROCORTISONE 10 MG PO TABS: 10.0000 mg | ORAL_TABLET | Freq: Two times a day (BID) | ORAL | Status: DC

## 2016-02-29 MED ORDER — FLUCONAZOLE 200 MG PO TABS: 200.0000 mg | ORAL_TABLET | Freq: Every day | ORAL | Status: AC

## 2016-02-29 MED ORDER — PANTOPRAZOLE SODIUM 40 MG PO TBEC: 40.0000 mg | DELAYED_RELEASE_TABLET | Freq: Two times a day (BID) | ORAL | Status: DC

## 2016-02-29 NOTE — Progress Notes (Signed)
Pharmacy Antibiotic Note  [name] is a 75 y.o. male admitted on 02/15/2016 with anemia and GIB.  Pharmacy has been consulted for Vancomycin and Zosyn dosing for sepsis/pneumonia that developed during his EGD on 11/14.  This was narrowed to Zosyn only for presumed aspiration PNA.  Fluconazole added for fungal esophagitis also seen on EGD.  Today, 02/29/2016:  Day 7 Diflucan  On steroid taper  Esophagitis looks better on 11/21 EGD  Plan:  Continue Fluconazole 200mg  IV q24h, per GI to continue through 11/29  Pharmacy will sign off note-writing as SCr appears relatively stable, but will follow peripherally for appropriate renal dosing and clinical improvement   Height: 5\' 8"  (172.7 cm) Weight: 146 lb (66.2 kg) IBW/kg (Calculated) : 68.4  Temp (24hrs), Avg:97.9 F (36.6 C), Min:97.7 F (36.5 C), Max:98.1 F (36.7 C)   Recent Labs Lab 02/25/16 0341 02/26/16 0533 02/27/16 0506 02/28/16 0351 02/29/16 0342  WBC 17.7* 13.2* 12.3* 12.7* 12.2*  CREATININE 2.91* 2.80* 2.61* 2.61* 2.37*    Estimated Creatinine Clearance: 25.6 mL/min (by C-G formula based on SCr of 2.37 mg/dL (H)).    No Known Allergies  Antimicrobials this admission: 11/14 vanc >> 11/16 11/14 zosyn >> 11/21 11/16 fluconazole >> (11/29 per GI)  Dose adjustments this admission: 11/16 1300 VT = canceled  Microbiology results: 11/9 MRSA: negative 11/14 bcx x2: neg FINAL 11/20 C diff: -/-  Thank you for allowing pharmacy to be a part of this patient's care.  Reuel Boom, PharmD, BCPS Pager: [phone] 02/29/2016, 8:43 AM

## 2016-02-29 NOTE — Progress Notes (Signed)
Patient is set to discharge to [facility] today. Patient & wife aware. Discharge packet given to RN, Marolyn Hammock. [facility] called for transport.     Raynaldo Opitz, [facility] Clinical Social Worker cell #: [phone]

## 2016-02-29 NOTE — Discharge Summary (Signed)
Physician Discharge Summary  [name] [id number] DOB: May 17, 1940 DOA: 02/15/2016  PCP: [name], FNP  Admit date: 02/15/2016 Discharge date: 02/29/2016  Time spent: 35 minutes  Recommendations for Outpatient Follow-up:  Full liquid diet Maintain head of bed at 45 degrees at all time 90 degrees position while eating and to maintain it after eating for at least 40 minutes Maintain adequate hydration Rehabilitation as per SNF protocol Repeat CBC in 5 days to follow Hgb trend  Repeat BMET to follow electrolytes and renal function in 5 days  Please follow up with GI as instructed   Discharge Diagnoses:  Principal Problem:   Acute GI bleeding Active Problems:   Essential hypertension   PROSTATE CANCER, HX OF   Anemia   Acute blood loss anemia   Acute esophagitis   Food impaction of esophagus   Achalasia   Pressure injury of skin   Hypernatremia   Heme positive stool   Sepsis ([place])   Discharge Condition: stable and improved. No further bleeding appreciated. Will follow up with GI as an outpatient. Patient discharge to SNF for further care and rehabilitation.  Diet recommendation: Full liquid diet only  Filed Weights   02/23/16 2335 02/24/16 0316 02/28/16 0715  Weight: 66.3 kg (146 lb 2.6 oz) 66.3 kg (146 lb 2.6 oz) 66.2 kg (146 lb)    History of present illness:  75 y.o. male with history of metastatic prostate cancer, diabetes mellitus type 2, hypertension, anemia and chronic kidney disease was referred to the ER after patient's hemoglobin was found to be around 7. 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 During that stay patient also received packed red blood cell transfusion for anemia and cause was not clear. In the ER today patient's hemoglobin is found to be on 7 and at  discharge it is around 8. Stool for occult blood is positive. Patient is hemodynamically stable. Patient's stool is not melanotic. Patient is being admitted for further observation for possible GI bleed. ER physician did discuss with Dr. Havery Moros on-call gastroenterologist, who will be seeing patient in consult.  Hospital Course:  SIRS due to Aspiration pneumonia/pneumonitis: SIRS resolved.  -Vanc/zosyn started 11/14 empirically for worsening SIRS with hypotension, and transferred to SDU following EGD > Stop date 11/21.  -Blood cultures remained negative.  -no SOB at discharge  ABLA on anemia of chronic disease due to acute GI bleed:  -Due to severe esophagitis related to achalasia seen on EGD.  -received 3 units of PRBC's during this admission  -discharge on iron supplementation  -Protonix BID at discharge  Achalasia: Chronic, Diagnosed in 1990's.  - EGD 11/21: botox injected into LES -per GI continue PPI twice a day -Full liquid diet -outpatient follow up with GI as an outpatient   Candida esophagitis: No esophageal fungal cultures obtained. Empiric Tx recommended by GI.  -Fluconazole until 11/29 -7 more days pending at discharge  Adrenal insufficiency: Due to abrupt discontinuation of prednisone. -during stress cortisol was 3.9 -will discharge on hydrocortisone 10mg  BID  Chronic diastolic CHF: Echo [id number] shows mild LVH, G2DD and IVC appearance suggestive of normal CVP.  -compensated -will discharge on home lasix regimen  -advise to watch sodium intake.  Essential hypertension:  -stable -will resume home antihypertensive drugs now  History of castration-resistant prostate CA: Followed by Dr.  Alen Blew -will resume zytiga -Outpatient follow up was scheduled 11/22  Chronic RLE DVT: Dx at recent hospitalization, put on Lovenox, on hold due to GI bleeding. -per  GI ok to resume lovenox now -will follow up with oncology as an outpatient   CKD stage IV: Suspect  2.3--2.5 is baseline, initially with AKI (Cr 3.65 at admission), has since returned to baseline.  -will resume home diuretics and nephrotoxic agents now -advise to maintained adequate hydration.  Hypokalemia/low magnesium: Recurrent, due poor intake and use of diuretics -resolved -will resume home medications regimen and follow BMET and MG in 1 week - Also hypomagnesemic (resolved s/p repletion)   Hypernatremia: Mild, and secondary to dehydration  -resolved with hydration -BMET recommended in 5 days to follow electrolytes   T2DM: HbA1c 7.1% in Sept 2017.  -ok to resume home oral hypoglycemics -needs close follow up to CBG's, as patient with allowance to only full liquid diet   Altered mental status: due to hospital acquired delirium -patient responded well to use of seroquel -at discharge mentation back to normal  HLD -will resume statins  Stage 2 pressure ulcer -continue wound care and constant repositioning -patient will also benefit of use of barrier creams  Procedures:  See below for x-ray reports   EGD with botox injection on 02/21/16 and 02/28/16  Consultations:  GI  Discharge Exam: Vitals:   02/29/16 0420 02/29/16 1320  BP: 128/82 131/79  Pulse: 76 79  Resp: 18 18  Temp: 97.7 F (36.5 C) 98 F (36.7 C)   General exam: Elderly male in no distress, afebrile and w/o complaints of SOB. Respiratory system: Nonlabored, no crackles; good air movement. Cardiovascular system: RRR, no murmur. No JVD. No edema Gastrointestinal system: Abdomen is nondistended, soft and nontender.  Extremities: No deformities, RLE with trace pitting edema, nontender. Skin: Stage II partial thickness open ulcer with healthy wound bed. Minimal surrounding erythema.  Neuro: Alert and oriented, no focal deficits. Psychiatry: Conversant, euthymic with mildly restricted affect   Discharge Instructions   Discharge Instructions    Diet - low sodium heart healthy    Complete by:  As  directed    Discharge instructions    Complete by:  As directed    Full liquid diet Maintain head of bed at 45 degrees at all time 90 degrees position while eating and to maintain it after eating for at least 40 minutes Maintain adequate hydration Rehabilitation as per SNF protocol Repeat CBC in 5 days to follow Hgb trend  Repeat BMET to follow electrolytes and renal function in 5 days  Please follow up with GI as instructed     Current Discharge Medication List    START taking these medications   Details  fluconazole (DIFLUCAN) 200 MG tablet Take 1 tablet (200 mg total) by mouth daily.    hydrocortisone (CORTEF) 10 MG tablet Take 1 tablet (10 mg total) by mouth 2 (two) times daily.    pantoprazole (PROTONIX) 40 MG tablet Take 1 tablet (40 mg total) by mouth 2 (two) times daily.      CONTINUE these medications which have NOT CHANGED   Details  acetaminophen (TYLENOL) 650 MG CR tablet Take 650 mg by mouth every 4 (four) hours as needed for pain.    atenolol (TENORMIN) 25 MG tablet Take 25 mg by mouth daily.    atorvastatin (LIPITOR) 10 MG tablet Take 1 tablet (10 mg total) by mouth daily after supper. Qty: 90 tablet, Refills: 0    docusate sodium (  COLACE) 100 MG capsule Take 1 capsule (100 mg total) by mouth 2 (two) times daily. Qty: 10 capsule, Refills: 0    enoxaparin (LOVENOX) 60 MG/0.6ML injection Inject 0.6 mLs (60 mg total) into the skin daily. Qty: 0 Syringe    feeding supplement, GLUCERNA SHAKE, (GLUCERNA SHAKE) LIQD Take 237 mLs by mouth 2 (two) times daily between meals.    ferrous sulfate 325 (65 FE) MG tablet Take 1 tablet (325 mg total) by mouth daily with breakfast. Qty: 30 tablet, Refills: 0    furosemide (LASIX) 40 MG tablet Take 40 mg by mouth daily.     glipiZIDE (GLUCOTROL XL) 2.5 MG 24 hr tablet Take 2.5 mg by mouth daily with breakfast.    ONE TOUCH ULTRA TEST test strip USE TO TEST ONCE DAILY Qty: 50 each, Refills: 3    ONETOUCH DELICA LANCETS  99991111 MISC USE TO TEST ONCE DAILY Dx code E11.65 Qty: 100 each, Refills: 0    pioglitazone (ACTOS) 30 MG tablet TAKE 1 TABLET BY MOUTH EVERY DAY Qty: 90 tablet, Refills: 0    polyethylene glycol (MIRALAX / GLYCOLAX) packet Take 17 g by mouth daily. Qty: 14 each, Refills: 0    potassium chloride SA (KLOR-CON M20) 20 MEQ tablet Take 1 tablet (20 mEq total) by mouth daily. Qty: 90 tablet, Refills: 1    TRADJENTA 5 MG TABS tablet TAKE 1 TABLET (5 MG TOTAL) BY MOUTH DAILY. Qty: 90 tablet, Refills: 0    ZYTIGA 250 MG tablet TAKE 4 TABLETS (1,000MG ) BY MOUTH EVERY DAY ON AN EMPTY STOMACH 1 HOURBEFORE OR 2 HOURS AFTER A MEAL Qty: 120 tablet, Refills: 0   Associated Diagnoses: Personal history of malignant neoplasm of prostate      STOP taking these medications     levofloxacin (LEVAQUIN) 750 MG tablet        No Known Allergies   The results of significant diagnostics from this hospitalization (including imaging, microbiology, ancillary and laboratory) are listed below for reference.    Significant Diagnostic Studies: Dg Chest 1 View  Result Date: 02/21/2016 CLINICAL DATA:  Hypotension. EXAM: CHEST 1 VIEW COMPARISON:  02/21/2016 FINDINGS: The heart is borderline enlarged. There is tortuosity of the thoracic aorta. Increased interstitial markings, peribronchial thickening, bilateral pleural effusions and bibasilar atelectasis suggesting CHF. The bony thorax is intact. IMPRESSION: CHF with bilateral effusions and bibasilar atelectasis. Electronically Signed   By: Marijo Sanes M.D.   On: 02/21/2016 16:51   Dg Chest Port 1 View  Result Date: 02/22/2016 CLINICAL DATA:  CHF, sepsis EXAM: PORTABLE CHEST 1 VIEW COMPARISON:  02/21/2016 FINDINGS: There is mild bilateral interstitial thickening. There is no focal parenchymal opacity. There is no pleural effusion or pneumothorax. The heart and mediastinal contours are unremarkable. The osseous structures are unremarkable. IMPRESSION: Findings most  consistent with mild interstitial edema. Electronically Signed   By: Kathreen Devoid   On: 02/22/2016 16:21   Dg Chest Port 1 View  Result Date: 02/21/2016 CLINICAL DATA:  Fever, nausea, and vomiting. EXAM: PORTABLE CHEST 1 VIEW COMPARISON:  Portable chest x-ray of February 02, 2016 FINDINGS: The lungs are adequately inflated. There is patchy increased density at the left lung base. The heart border remain sharp. A trace of pleural fluid blunts the left costophrenic angle. The heart and pulmonary vascularity is normal. There is prominence of the aortic arch and proximal descending thoracic aorta which is stable. The mediastinum is normal in width. The bony thorax exhibits no acute abnormality. IMPRESSION: Slight interval  increase in left lower lobe atelectasis or pneumonia. Reportedly there is a large hiatal hernia and a portion of the density may be related to this. No CHF. Trace left pleural effusion. Electronically Signed   By: David  Martinique M.D.   On: 02/21/2016 07:20   Dg Chest Port 1 View  Result Date: 02/02/2016 CLINICAL DATA:  Follow-up pneumonia EXAM: PORTABLE CHEST 1 VIEW COMPARISON:  01/28/2016 FINDINGS: Cardiomediastinal silhouette is stable. Large hiatal hernia left lower lobe retrocardiac again noted. No infiltrate or pulmonary edema. Tortuous descending aorta. IMPRESSION: Large hiatal hernia again noted.  No infiltrate or pulmonary edema. Electronically Signed   By: Lahoma Crocker M.D.   On: 02/02/2016 08:34    Microbiology: Recent Results (from the past 240 hour(s))  Culture, blood (routine x 2)     Status: None   Collection Time: 02/21/16  6:50 AM  Result Value Ref Range Status   Specimen Description BLOOD RIGHT ARM  Final   Special Requests BOTTLES DRAWN AEROBIC ONLY 5CC  Final   Culture   Final    NO GROWTH 5 DAYS Performed at [facility]    Report Status 02/26/2016 FINAL  Final  Culture, blood (routine x 2)     Status: None   Collection Time: 02/21/16  6:51 AM  Result  Value Ref Range Status   Specimen Description BLOOD RIGHT HAND  Final   Special Requests BOTTLES DRAWN AEROBIC AND ANAEROBIC 5CC  Final   Culture   Final    NO GROWTH 5 DAYS Performed at [facility]    Report Status 02/26/2016 FINAL  Final  C difficile quick scan w PCR reflex     Status: None   Collection Time: 02/27/16  4:50 PM  Result Value Ref Range Status   C Diff antigen NEGATIVE NEGATIVE Final   C Diff toxin NEGATIVE NEGATIVE Final   C Diff interpretation No C. difficile detected.  Final     Labs: Basic Metabolic Panel:  Recent Labs Lab 02/24/16 0302 02/25/16 0341 02/26/16 0533 02/27/16 0506 02/28/16 0351 02/29/16 0342  NA 145 145 143 143 142 143  K 3.0* 3.1* 3.5 4.6 4.1 4.9  CL 113* 114* 114* 116* 116* 117*  CO2 23 19* 20* 18* 19* 20*  GLUCOSE 173* 151* 183* 121* 139* 112*  BUN 45* 44* 47* 46* 43* 35*  CREATININE 2.67* 2.91* 2.80* 2.61* 2.61* 2.37*  CALCIUM 7.6* 7.5* 7.1* 7.3* 7.4* 7.7*  MG 1.4* 1.7 1.7  --   --   --    Liver Function Tests:  Recent Labs Lab 02/24/16 0302 02/26/16 0533 02/27/16 0506 02/28/16 0351  AST 42* 19 19 20   ALT 15* 12* 12* 11*  ALKPHOS 72 56 58 60  BILITOT 0.9 0.5 0.9 0.8  PROT 4.8* 4.5* 4.8* 4.7*  ALBUMIN 1.4* 1.4* 1.6* 1.7*   CBC:  Recent Labs Lab 02/25/16 0341 02/26/16 0533 02/27/16 0506 02/28/16 0351 02/29/16 0342  WBC 17.7* 13.2* 12.3* 12.7* 12.2*  HGB 9.1* 9.1* 9.3* 9.6* 10.5*  HCT 27.2* 27.7* 28.0* 29.5* 32.6*  MCV 85.5 86.3 86.2 86.8 87.4  PLT 362 373 387 444* 520*    CBG:  Recent Labs Lab 02/28/16 2032 02/28/16 2353 02/29/16 0416 02/29/16 0731 02/29/16 1223  GLUCAP 182* 159* 97 75 127*     Signed:  Barton Dubois MD.  [facility] 02/29/2016, 2:56 PM

## 2016-02-29 NOTE — Progress Notes (Signed)
Report called to nurse Colletta Maryland at [facility]. All questions answered and D/C summary printed and given to EMS. Patient left via stretched with EMS- VSS, patient A&O, no signs of distress or discomfort upon D/C.

## 2016-02-29 NOTE — Progress Notes (Signed)
     [facility] Progress Note    Chief Complaint: weak, swallowing problems  Subjective: Swallowing liquids okay, wants to advance diet.   Objective:  Vital signs in last 24 hours: Temp:  [97.7 F (36.5 C)-98.1 F (36.7 C)] 97.7 F (36.5 C) (11/22 0420) Pulse Rate:  [76-86] 76 (11/22 0420) Resp:  [16-18] 18 (11/22 0420) BP: (128-154)/(80-86) 128/82 (11/22 0420) SpO2:  [97 %-100 %] 100 % (11/22 0420) Last BM Date: 02/28/16 General:   Alert, thin black male in NAD Heart:  Regular rate and rhythm, 1-2+ BLE edema Pulm: Normal respiratory effort, lungs CTA bilaterally without wheezes or crackles. Abdomen:  Soft, nondistended, nontender.  Normal bowel sounds    Neurologic:  Alert and  oriented x4;  grossly normal neurologically. Psych:  Alert and cooperative. Normal mood and affect.  Intake/Output from previous day: 11/21 0701 - 11/22 0700 In: 2400 [I.V.:2400] Out: 625 [Urine:625] Intake/Output this shift: Total I/O In: 480 [P.O.:480] Out: -   Lab Results:  Recent Labs  02/27/16 0506 02/28/16 0351 02/29/16 0342  WBC 12.3* 12.7* 12.2*  HGB 9.3* 9.6* 10.5*  HCT 28.0* 29.5* 32.6*  PLT 387 444* 520*   BMET  Recent Labs  02/27/16 0506 02/28/16 0351 02/29/16 0342  NA 143 142 143  K 4.6 4.1 4.9  CL 116* 116* 117*  CO2 18* 19* 20*  GLUCOSE 121* 139* 112*  BUN 46* 43* 35*  CREATININE 2.61* 2.61* 2.37*  CALCIUM 7.3* 7.4* 7.7*   LFT  Recent Labs  02/28/16 0351  PROT 4.7*  ALBUMIN 1.7*  AST 20  ALT 11*  ALKPHOS 60  BILITOT 0.8    Assessment / Plan:  1. 75 yo male with achalasia, s/p EGD with botox injection to LES yesterday.  -clear liquid diet ordered but states he got mashed potatoes.  Hungry, wants to advance diet.  -Can advance to full liquids. -patient can go home today on full liquids from GI standpoint.  Our office will call him to arrange for follow up with Dr. Ardis Hughs.  -he should keep HOB at all times, preferable at least 45  degrees and 90 degrees during meals.   2. Esophagitis, improving. No Barrett's on biopsies -continue PPI  3. Aspiration PNA, treated with zosyn  4. Severe protein calorie malnutrition   LOS: 13 days   Tye Savoy  02/29/2016, 11:12 AM  Pager number [phone]

## 2016-02-29 NOTE — Progress Notes (Signed)
Physical Therapy Treatment Patient Details Name: [name] MRN: [id number] DOB: 1940/09/02 Today's Date: 02/29/2016    History of Present Illness 75 yo male admitted with GI hemorrhage, achalasia. Hx of HTN, CKD, DVT, DM, prostate cancer, falls.     PT Comments    Patient was seen in bed upon arrival. Resting HR 80 bpm. Patient denies pain, but only when sitting on BSC due to bed sore. Performed supine to sit x modA with VC's to move LE over in bed and modA to raise trunk. Sit to stand x modA with VC's for safe UE/LE placement. Standing pivot transfer x modA with VC's for safe sequencing over to the [facility] and to reach back before sitting. Patient was only able to stand 10 seconds before having to sit due to fatigue. HR max on BSC was 106 bpm. Had patient stand again and replaced BSC with recliner by swithcing from behind.  Repositioned in recliner before leaving. Patient is limited due to weakness, fatigue, and decreased activity tolerance.   Follow Up Recommendations  SNF     Equipment Recommendations  Rolling walker with 5" wheels    Recommendations for Other Services       Precautions / Restrictions Precautions Precautions: Fall Restrictions Weight Bearing Restrictions: No    Mobility  Bed Mobility Overal bed mobility: Needs Assistance       Supine to sit: +2 for safety/equipment;HOB elevated;Mod assist     General bed mobility comments: Pt assisted with moving LE in bed following VC's. Required modA to raise trunk in bed and to use bed rail for support.   Transfers Overall transfer level: Needs assistance Equipment used: Rolling walker (2 wheeled) Transfers: Sit to/from [facility] Sit to Stand: +2 physical assistance;Mod assist;From elevated surface;+2 safety/equipment Stand pivot transfers: +2 physical assistance;Mod assist;+2 safety/equipment;From elevated surface       General transfer comment: VC's for safe LE/UE placement placement.  Requires elevated surface with 2 side by side assistance.  VC's to side step and for controlled descent onto [facility] and recliner.   Ambulation/Gait                 Stairs            Wheelchair Mobility    Modified Rankin (Stroke Patients Only)       Balance                                    Cognition Arousal/Alertness: Awake/alert Behavior During Therapy: WFL for tasks assessed/performed Overall Cognitive Status: No family/caregiver present to determine baseline cognitive functioning                      Exercises      General Comments        Pertinent Vitals/Pain Pain Assessment: 0-10 Pain Score: 4  Pain Location: butt d/t bed sore when sitting on BSC Pain Descriptors / Indicators: Discomfort Pain Intervention(s): Monitored during session;Repositioned;Limited activity within patient's tolerance    Home Living                      Prior Function            PT Goals (current goals can now be found in the care plan section) Progress towards PT goals: Progressing toward goals    Frequency    Min 2X/week      PT  Plan Current plan remains appropriate    Co-evaluation             End of Session Equipment Utilized During Treatment: Gait belt Activity Tolerance: Patient limited by fatigue;Patient tolerated treatment well Patient left: with call bell/phone within reach;with bed alarm set;in chair     Time: [id number] PT Time Calculation (min) (ACUTE ONLY): 27 min  Charges:  $Therapeutic Activity: 23-37 mins                    G CodesHall Busing, SPTA [facility] Acute Rehab [phone]  Present and agree with above  Rica Koyanagi  PTA [facility]  Acute  Rehab Pager      [phone]

## 2016-03-05 ENCOUNTER — Telehealth: Payer: Self-pay

## 2016-03-05 NOTE — Telephone Encounter (Signed)
appt made and letter mailed to the pt with that information

## 2016-03-05 NOTE — Telephone Encounter (Signed)
-----   Message from Willia Craze, NP sent at 02/29/2016 12:17 PM EST ----- Hi Belford Pascucci, Can you call this patient with a hospital follow up with Dr. Ardis Hughs. He had botox for achalasia, going home on full liquids. Thanks.  Happy Thanksgiving !  -PG

## 2016-03-07 ENCOUNTER — Telehealth: Payer: Self-pay | Admitting: Gastroenterology

## 2016-03-07 NOTE — Telephone Encounter (Signed)
Tammy from [facility] also calling wanting to get patient scheduled as soon as possible. Will wait to hear from patient's wife when patient is scheduled since she called earlier.

## 2016-03-08 ENCOUNTER — Telehealth: Payer: Self-pay | Admitting: Gastroenterology

## 2016-03-08 DIAGNOSIS — D62 Acute posthemorrhagic anemia: Secondary | ICD-10-CM | POA: Diagnosis not present

## 2016-03-08 DIAGNOSIS — K209 Esophagitis, unspecified: Secondary | ICD-10-CM | POA: Diagnosis not present

## 2016-03-08 DIAGNOSIS — L89322 Pressure ulcer of left buttock, stage 2: Secondary | ICD-10-CM | POA: Diagnosis not present

## 2016-03-08 DIAGNOSIS — I82419 Acute embolism and thrombosis of unspecified femoral vein: Secondary | ICD-10-CM | POA: Diagnosis not present

## 2016-03-08 DIAGNOSIS — E1122 Type 2 diabetes mellitus with diabetic chronic kidney disease: Secondary | ICD-10-CM | POA: Diagnosis not present

## 2016-03-08 NOTE — Telephone Encounter (Signed)
Left message on machine to call back  

## 2016-03-08 NOTE — Telephone Encounter (Signed)
I spoke with the pt's wife and she states that the pt is still in rehab.  She was given an appt for 05/22/16 and also pt was added to the wait list.  She will call if the pt is released from rehab prior to that time.

## 2016-03-08 NOTE — Telephone Encounter (Signed)
[name] how long does the pt need to remain on full liquids?  The wife states that the pt is still in rehab.  Please advise

## 2016-03-08 NOTE — Telephone Encounter (Signed)
Patty, Im going to forward to Dr. Havery Moros because he did the botox injection. I think I remember him implying that full liquids might be it for a long time but let's see what thinks. Thanks-PG

## 2016-03-08 NOTE — Telephone Encounter (Signed)
[name], this patient has severe achalasia, complicated by aspiration pneumonia. He is Dr. Eugenia Pancoast patient so ultimately defer to him, but I had recommended full liquid diet indefinitely until he follows up with [place] given the severity of his esophagitis and pneumonia.

## 2016-03-09 NOTE — Telephone Encounter (Signed)
No openings until 05/02/15 Left message for patient to call back

## 2016-03-14 ENCOUNTER — Encounter (HOSPITAL_COMMUNITY): Payer: Self-pay

## 2016-03-14 ENCOUNTER — Inpatient Hospital Stay (HOSPITAL_COMMUNITY)
Admission: EM | Admit: 2016-03-14 | Discharge: 2016-03-17 | DRG: 812 | Disposition: A | Discharge status: Skilled Nursing Facility | Payer: Medicare Other | Attending: Internal Medicine | Admitting: Internal Medicine

## 2016-03-14 DIAGNOSIS — K921 Melena: Secondary | ICD-10-CM | POA: Diagnosis present

## 2016-03-14 DIAGNOSIS — Z794 Long term (current) use of insulin: Secondary | ICD-10-CM

## 2016-03-14 DIAGNOSIS — F0390 Unspecified dementia without behavioral disturbance: Secondary | ICD-10-CM | POA: Diagnosis present

## 2016-03-14 DIAGNOSIS — Z7901 Long term (current) use of anticoagulants: Secondary | ICD-10-CM | POA: Diagnosis not present

## 2016-03-14 DIAGNOSIS — Z8249 Family history of ischemic heart disease and other diseases of the circulatory system: Secondary | ICD-10-CM | POA: Diagnosis not present

## 2016-03-14 DIAGNOSIS — E1129 Type 2 diabetes mellitus with other diabetic kidney complication: Secondary | ICD-10-CM

## 2016-03-14 DIAGNOSIS — C61 Malignant neoplasm of prostate: Secondary | ICD-10-CM | POA: Diagnosis present

## 2016-03-14 DIAGNOSIS — Z87891 Personal history of nicotine dependence: Secondary | ICD-10-CM

## 2016-03-14 DIAGNOSIS — E876 Hypokalemia: Secondary | ICD-10-CM | POA: Diagnosis not present

## 2016-03-14 DIAGNOSIS — Z833 Family history of diabetes mellitus: Secondary | ICD-10-CM

## 2016-03-14 DIAGNOSIS — Z923 Personal history of irradiation: Secondary | ICD-10-CM

## 2016-03-14 DIAGNOSIS — I5032 Chronic diastolic (congestive) heart failure: Secondary | ICD-10-CM | POA: Diagnosis not present

## 2016-03-14 DIAGNOSIS — D62 Acute posthemorrhagic anemia: Principal | ICD-10-CM | POA: Diagnosis present

## 2016-03-14 DIAGNOSIS — I472 Ventricular tachycardia: Secondary | ICD-10-CM | POA: Diagnosis not present

## 2016-03-14 DIAGNOSIS — D649 Anemia, unspecified: Secondary | ICD-10-CM | POA: Diagnosis not present

## 2016-03-14 DIAGNOSIS — E2740 Unspecified adrenocortical insufficiency: Secondary | ICD-10-CM | POA: Diagnosis not present

## 2016-03-14 DIAGNOSIS — R195 Other fecal abnormalities: Secondary | ICD-10-CM | POA: Diagnosis present

## 2016-03-14 DIAGNOSIS — M6281 Muscle weakness (generalized): Secondary | ICD-10-CM | POA: Diagnosis not present

## 2016-03-14 DIAGNOSIS — E119 Type 2 diabetes mellitus without complications: Secondary | ICD-10-CM | POA: Diagnosis not present

## 2016-03-14 DIAGNOSIS — R6520 Severe sepsis without septic shock: Secondary | ICD-10-CM | POA: Diagnosis not present

## 2016-03-14 DIAGNOSIS — E1122 Type 2 diabetes mellitus with diabetic chronic kidney disease: Secondary | ICD-10-CM | POA: Diagnosis present

## 2016-03-14 DIAGNOSIS — K219 Gastro-esophageal reflux disease without esophagitis: Secondary | ICD-10-CM | POA: Diagnosis not present

## 2016-03-14 DIAGNOSIS — R2689 Other abnormalities of gait and mobility: Secondary | ICD-10-CM | POA: Diagnosis not present

## 2016-03-14 DIAGNOSIS — I1310 Hypertensive heart and chronic kidney disease without heart failure, with stage 1 through stage 4 chronic kidney disease, or unspecified chronic kidney disease: Secondary | ICD-10-CM | POA: Diagnosis not present

## 2016-03-14 DIAGNOSIS — N183 Chronic kidney disease, stage 3 (moderate): Secondary | ICD-10-CM | POA: Diagnosis present

## 2016-03-14 DIAGNOSIS — I10 Essential (primary) hypertension: Secondary | ICD-10-CM | POA: Diagnosis not present

## 2016-03-14 DIAGNOSIS — D500 Iron deficiency anemia secondary to blood loss (chronic): Secondary | ICD-10-CM

## 2016-03-14 DIAGNOSIS — E870 Hyperosmolality and hypernatremia: Secondary | ICD-10-CM | POA: Diagnosis present

## 2016-03-14 DIAGNOSIS — I82501 Chronic embolism and thrombosis of unspecified deep veins of right lower extremity: Secondary | ICD-10-CM | POA: Diagnosis not present

## 2016-03-14 DIAGNOSIS — R1314 Dysphagia, pharyngoesophageal phase: Secondary | ICD-10-CM | POA: Diagnosis not present

## 2016-03-14 DIAGNOSIS — E860 Dehydration: Secondary | ICD-10-CM | POA: Diagnosis not present

## 2016-03-14 DIAGNOSIS — B3781 Candidal esophagitis: Secondary | ICD-10-CM | POA: Diagnosis not present

## 2016-03-14 DIAGNOSIS — E784 Other hyperlipidemia: Secondary | ICD-10-CM | POA: Diagnosis not present

## 2016-03-14 DIAGNOSIS — E785 Hyperlipidemia, unspecified: Secondary | ICD-10-CM | POA: Diagnosis present

## 2016-03-14 DIAGNOSIS — K220 Achalasia of cardia: Secondary | ICD-10-CM | POA: Diagnosis not present

## 2016-03-14 DIAGNOSIS — N179 Acute kidney failure, unspecified: Secondary | ICD-10-CM | POA: Diagnosis not present

## 2016-03-14 DIAGNOSIS — R531 Weakness: Secondary | ICD-10-CM | POA: Diagnosis not present

## 2016-03-14 DIAGNOSIS — K922 Gastrointestinal hemorrhage, unspecified: Secondary | ICD-10-CM | POA: Diagnosis not present

## 2016-03-14 DIAGNOSIS — J189 Pneumonia, unspecified organism: Secondary | ICD-10-CM | POA: Diagnosis not present

## 2016-03-14 DIAGNOSIS — E46 Unspecified protein-calorie malnutrition: Secondary | ICD-10-CM | POA: Diagnosis not present

## 2016-03-14 LAB — CBC
HCT: 21.7 % — ABNORMAL LOW (ref 39.0–52.0)
HEMOGLOBIN: 7.1 g/dL — AB (ref 13.0–17.0)
MCH: 28.2 pg (ref 26.0–34.0)
MCHC: 32.7 g/dL (ref 30.0–36.0)
MCV: 86.1 fL (ref 78.0–100.0)
Platelets: 244 10*3/uL (ref 150–400)
RBC: 2.52 MIL/uL — AB (ref 4.22–5.81)
RDW: 17.3 % — ABNORMAL HIGH (ref 11.5–15.5)
WBC: 7.6 10*3/uL (ref 4.0–10.5)

## 2016-03-14 LAB — BASIC METABOLIC PANEL
ANION GAP: 13 (ref 5–15)
BUN: 22 mg/dL — ABNORMAL HIGH (ref 6–20)
CALCIUM: 7.3 mg/dL — AB (ref 8.9–10.3)
CO2: 27 mmol/L (ref 22–32)
Chloride: 108 mmol/L (ref 101–111)
Creatinine, Ser: 1.94 mg/dL — ABNORMAL HIGH (ref 0.61–1.24)
GFR, EST AFRICAN AMERICAN: 37 mL/min — AB (ref >60)
GFR, EST NON AFRICAN AMERICAN: 32 mL/min — AB (ref >60)
Glucose, Bld: 201 mg/dL — ABNORMAL HIGH (ref 65–99)
Potassium: 2.5 mmol/L — CL (ref 3.5–5.1)
Sodium: 148 mmol/L — ABNORMAL HIGH (ref 135–145)

## 2016-03-14 LAB — URINALYSIS, ROUTINE W REFLEX MICROSCOPIC
Bilirubin Urine: NEGATIVE
Glucose, UA: NEGATIVE mg/dL
Hgb urine dipstick: NEGATIVE
Ketones, ur: NEGATIVE mg/dL
LEUKOCYTES UA: NEGATIVE
NITRITE: NEGATIVE
Protein, ur: NEGATIVE mg/dL
SPECIFIC GRAVITY, URINE: 1.009 (ref 1.005–1.030)
pH: 5 (ref 5.0–8.0)

## 2016-03-14 LAB — PREPARE RBC (CROSSMATCH)

## 2016-03-14 LAB — GLUCOSE, CAPILLARY: GLUCOSE-CAPILLARY: 108 mg/dL — AB (ref 65–99)

## 2016-03-14 LAB — PROTIME-INR
INR: 1.04
PROTHROMBIN TIME: 13.6 s (ref 11.4–15.2)

## 2016-03-14 LAB — CBG MONITORING, ED: GLUCOSE-CAPILLARY: 187 mg/dL — AB (ref 65–99)

## 2016-03-14 MED ORDER — SODIUM CHLORIDE 0.9 % IV BOLUS (SEPSIS)
1000.0000 mL | Freq: Once | INTRAVENOUS | Status: AC
  Administered 2016-03-14: 1000 mL via INTRAVENOUS

## 2016-03-14 MED ORDER — HYDROCODONE-ACETAMINOPHEN 5-325 MG PO TABS: 1.0000 | ORAL_TABLET | ORAL | Status: DC | PRN

## 2016-03-14 MED ORDER — HYDRALAZINE HCL 20 MG/ML IJ SOLN: 10.0000 mg | Freq: Four times a day (QID) | INTRAMUSCULAR | Status: DC | PRN

## 2016-03-14 MED ORDER — ATORVASTATIN CALCIUM 10 MG PO TABS
10.0000 mg | ORAL_TABLET | Freq: Every day | ORAL | Status: DC
  Administered 2016-03-14 – 2016-03-16 (×3): 10 mg via ORAL
  Filled 2016-03-14 (×3): qty 1

## 2016-03-14 MED ORDER — PEG-KCL-NACL-NASULF-NA ASC-C 100 G PO SOLR: 1.0000 | Freq: Once | ORAL | Status: DC

## 2016-03-14 MED ORDER — INSULIN ASPART 100 UNIT/ML ~~LOC~~ SOLN
0.0000 [IU] | Freq: Every day | SUBCUTANEOUS | Status: DC
  Administered 2016-03-16: 2 [IU] via SUBCUTANEOUS

## 2016-03-14 MED ORDER — ACETAMINOPHEN 325 MG PO TABS: 650.0000 mg | ORAL_TABLET | Freq: Four times a day (QID) | ORAL | Status: DC | PRN

## 2016-03-14 MED ORDER — ONDANSETRON HCL 4 MG PO TABS: 4.0000 mg | ORAL_TABLET | Freq: Four times a day (QID) | ORAL | Status: DC | PRN

## 2016-03-14 MED ORDER — PEG-KCL-NACL-NASULF-NA ASC-C 100 G PO SOLR
0.5000 | Freq: Once | ORAL | Status: AC
  Administered 2016-03-14: 100 g via ORAL

## 2016-03-14 MED ORDER — ACETAMINOPHEN 650 MG RE SUPP: 650.0000 mg | Freq: Four times a day (QID) | RECTAL | Status: DC | PRN

## 2016-03-14 MED ORDER — POTASSIUM CHLORIDE CRYS ER 20 MEQ PO TBCR: 20.0000 meq | EXTENDED_RELEASE_TABLET | Freq: Every day | ORAL | Status: DC

## 2016-03-14 MED ORDER — ONDANSETRON HCL 4 MG/2ML IJ SOLN: 4.0000 mg | Freq: Four times a day (QID) | INTRAMUSCULAR | Status: DC | PRN

## 2016-03-14 MED ORDER — SODIUM CHLORIDE 0.9 % IV SOLN
10.0000 mL/h | Freq: Once | INTRAVENOUS | Status: AC
  Administered 2016-03-14: 10 mL/h via INTRAVENOUS

## 2016-03-14 MED ORDER — SODIUM CHLORIDE 0.9% FLUSH
3.0000 mL | Freq: Two times a day (BID) | INTRAVENOUS | Status: DC
  Administered 2016-03-15 – 2016-03-17 (×5): 3 mL via INTRAVENOUS

## 2016-03-14 MED ORDER — HYDROCORTISONE 10 MG PO TABS
10.0000 mg | ORAL_TABLET | Freq: Two times a day (BID) | ORAL | Status: DC
  Administered 2016-03-14 – 2016-03-17 (×6): 10 mg via ORAL
  Filled 2016-03-14 (×7): qty 1

## 2016-03-14 MED ORDER — INSULIN ASPART 100 UNIT/ML ~~LOC~~ SOLN
0.0000 [IU] | Freq: Three times a day (TID) | SUBCUTANEOUS | Status: DC
  Administered 2016-03-15: 2 [IU] via SUBCUTANEOUS
  Administered 2016-03-16 (×2): 1 [IU] via SUBCUTANEOUS
  Administered 2016-03-16 – 2016-03-17 (×2): 2 [IU] via SUBCUTANEOUS

## 2016-03-14 MED ORDER — POTASSIUM CHLORIDE 2 MEQ/ML IV SOLN
30.0000 meq | Freq: Once | INTRAVENOUS | Status: AC
  Administered 2016-03-14: 30 meq via INTRAVENOUS
  Filled 2016-03-14: qty 15

## 2016-03-14 MED ORDER — ATENOLOL 25 MG PO TABS
25.0000 mg | ORAL_TABLET | Freq: Every day | ORAL | Status: DC
  Administered 2016-03-15 – 2016-03-17 (×3): 25 mg via ORAL
  Filled 2016-03-14 (×3): qty 1

## 2016-03-14 MED ORDER — PANTOPRAZOLE SODIUM 40 MG PO TBEC
40.0000 mg | DELAYED_RELEASE_TABLET | Freq: Two times a day (BID) | ORAL | Status: DC
  Administered 2016-03-14 – 2016-03-17 (×6): 40 mg via ORAL
  Filled 2016-03-14 (×6): qty 1

## 2016-03-14 MED ORDER — SODIUM CHLORIDE 0.9 % IV SOLN: INTRAVENOUS | Status: DC

## 2016-03-14 MED ORDER — PEG-KCL-NACL-NASULF-NA ASC-C 100 G PO SOLR
0.5000 | Freq: Once | ORAL | Status: AC
  Administered 2016-03-14: 100 g via ORAL
  Filled 2016-03-14: qty 1

## 2016-03-14 MED ORDER — SODIUM CHLORIDE 0.9 % IV SOLN
INTRAVENOUS | Status: DC
Start: 2016-03-14 — End: 2016-03-16

## 2016-03-14 NOTE — ED Provider Notes (Signed)
[place] DEPT Provider Note   CSN: [id number] Arrival date & time: 03/14/16  1245     History   Chief Complaint Chief Complaint  Patient presents with  . Weakness  . Leg Swelling  . Abnormal Lab    HPI [name] is a 75 y.o. male.  Patient complains of weakness. Patient has a history of esophagitis and anemia from it. Patient had a hemoglobin drawn at the rehabilitation center and his hemoglobin was 6.8   The history is provided by the patient. No language interpreter was used.  Weakness  Primary symptoms include no focal weakness. This is a recurrent problem. The current episode started more than 2 days ago. The problem has not changed since onset.There was no focality noted. There has been no fever. Pertinent negatives include no shortness of breath, no chest pain and no headaches. There were no medications administered prior to arrival. Associated medical issues do not include trauma.  Abnormal Lab    Past Medical History:  Diagnosis Date  . Cataract   . CKD (chronic kidney disease) 02/2016  . Diabetes mellitus, type 2 ([place]) 2008  . Hyperlipidemia 2008  . Hypertension 2008  . Prostate cancer [facility]) 2006    Patient Active Problem List   Diagnosis Date Noted  . Sepsis ([place]) 02/21/2016  . Heme positive stool   . Pressure injury of skin 02/19/2016  . Hypernatremia 02/19/2016  . Acute esophagitis   . Food impaction of esophagus   . Achalasia   . Acute GI bleeding 02/16/2016  . Acute blood loss anemia 02/16/2016  . Anemia 02/15/2016  . Weakness 01/29/2016  . Right leg weakness 01/29/2016  . HCAP (healthcare-associated pneumonia) 01/28/2016  . Dehydration   . Hypomagnesemia   . Protein-calorie malnutrition, severe 01/09/2016  . Hyperglycemia 01/08/2016  . Hypokalemia 01/08/2016  . Acute kidney injury superimposed on chronic kidney disease ([place]) 01/08/2016  . Prolonged Q-T interval on ECG 01/08/2016  . 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 Bilateral  lower extremity edema 09/07/2013  . Type II or unspecified type diabetes mellitus without mention of complication, uncontrolled 05/20/2013  . Pulmonary embolus ([place]) 09/01/2010  . Diabetes mellitus type 2 in nonobese ([place]) 09/15/2006  . Hyperlipidemia associated with type 2 diabetes mellitus ([place]) 09/13/2006  . Essential hypertension 09/13/2006  . Disorder resulting from impaired renal function 09/13/2006  . PROSTATE CANCER, HX OF 09/13/2006    Past Surgical History:  Procedure Laterality Date  . COLONOSCOPY W/ POLYPECTOMY  03/2008   diminutive rectal polyp (path: prolapse type polyp, not adenoma).  moderate pan-diverticulitis.   [facility] ESOPHAGEAL MANOMETRY  1984   unable to pass manometry catheter beyond LES. Aperiystalsis of esophagus: sugg of achalasia.   [facility] ESOPHAGOGASTRODUODENOSCOPY N/A 02/17/2016   Procedure: ESOPHAGOGASTRODUODENOSCOPY (EGD);  Surgeon: [name], MD;  Location: [facility];  Service: Endoscopy;  Laterality: N/A;  . ESOPHAGOGASTRODUODENOSCOPY (EGD) WITH PROPOFOL N/A 02/21/2016   Procedure: ESOPHAGOGASTRODUODENOSCOPY (EGD) WITH PROPOFOL;  Surgeon: Jerene Bears, MD;  Location: [facility];  Service: Gastroenterology;  Laterality: N/A;  . ESOPHAGOGASTRODUODENOSCOPY (EGD) WITH PROPOFOL N/A 02/28/2016   Procedure: ESOPHAGOGASTRODUODENOSCOPY (EGD) WITH PROPOFOL withBOTOX;  Surgeon: Manus Gunning, MD;  Location: [facility];  Service: Gastroenterology;  Laterality: N/A;  . PROSTATE SURGERY     radiation therapy only       Home Medications    Prior to Admission medications   Medication Sig Start Date End Date Taking? Authorizing Provider  acetaminophen (TYLENOL) 650 MG CR tablet Take 650  mg by mouth every 4 (four) hours as needed for pain.   Yes Historical Provider, MD  atenolol (TENORMIN) 25 MG tablet Take 25 mg by mouth daily.   Yes Historical Provider, MD  atorvastatin (LIPITOR) 10 MG tablet Take 1 tablet (10 mg total) by mouth daily after supper.  10/10/15  Yes Elayne Snare, MD  docusate sodium (COLACE) 100 MG capsule Take 1 capsule (100 mg total) by mouth 2 (two) times daily. 02/02/16  Yes Donne Hazel, MD  enoxaparin (LOVENOX) 60 MG/0.6ML injection Inject 0.6 mLs (60 mg total) into the skin daily. 02/02/16  Yes Donne Hazel, MD  ferrous sulfate 325 (65 FE) MG tablet Take 1 tablet (325 mg total) by mouth daily with breakfast. 02/02/16  Yes Donne Hazel, MD  furosemide (LASIX) 40 MG tablet Take 40 mg by mouth daily.  01/11/16  Yes Historical Provider, MD  glipiZIDE (GLUCOTROL XL) 2.5 MG 24 hr tablet Take 2.5 mg by mouth daily with breakfast.   Yes Historical Provider, MD  hydrocortisone (CORTEF) 10 MG tablet Take 1 tablet (10 mg total) by mouth 2 (two) times daily. 02/29/16  Yes Barton Dubois, MD  Multiple Vitamin (MULTIVITAMIN WITH MINERALS) TABS tablet Take 1 tablet by mouth daily.   Yes Historical Provider, MD  pantoprazole (PROTONIX) 40 MG tablet Take 1 tablet (40 mg total) by mouth 2 (two) times daily. 02/29/16  Yes Barton Dubois, MD  pioglitazone (ACTOS) 30 MG tablet TAKE 1 TABLET BY MOUTH EVERY DAY Patient taking differently: TAKE 30mg  TABLET BY MOUTH EVERY DAY 01/02/16  Yes Elayne Snare, MD  polyethylene glycol (MIRALAX / GLYCOLAX) packet Take 17 g by mouth daily. 02/03/16  Yes Donne Hazel, MD  potassium chloride SA (KLOR-CON M20) 20 MEQ tablet Take 1 tablet (20 mEq total) by mouth daily. 09/01/15  Yes [name], FNP  TRADJENTA 5 MG TABS tablet TAKE 1 TABLET (5 MG TOTAL) BY MOUTH DAILY. 02/24/16  Yes Elayne Snare, MD  ZYTIGA 250 MG tablet TAKE 4 TABLETS (1,000MG ) BY MOUTH EVERY DAY ON AN EMPTY STOMACH 1 HOURBEFORE OR 2 HOURS AFTER A MEAL Patient taking differently: TAKE 1000MG  BY MOUTH EVERY DAY 01/10/16  Yes Wyatt Portela, MD  feeding supplement, GLUCERNA SHAKE, (GLUCERNA SHAKE) LIQD Take 237 mLs by mouth 2 (two) times daily between meals.    Historical Provider, MD  ONE TOUCH ULTRA TEST test strip USE TO TEST ONCE DAILY 08/29/15    Elayne Snare, MD  [facility] DELICA LANCETS [id number] MISC USE TO TEST ONCE DAILY Dx code E11.65 01/04/16   Elayne Snare, MD    Family History Family History  Problem Relation Age of Onset  . Diabetes Father   . Diabetes Sister   . Hypertension Sister   . Hypertension Brother   . Heart disease Neg Hx     Social History Social History  Substance Use Topics  . Smoking status: Former Smoker    Years: 10.00    Quit date: 2000  . Smokeless tobacco: Never Used  . Alcohol use No     Allergies   Patient has no known allergies.   Review of Systems Review of Systems  Constitutional: Negative for appetite change and fatigue.  HENT: Negative for congestion, ear discharge and sinus pressure.   Eyes: Negative for discharge.  Respiratory: Negative for cough and shortness of breath.   Cardiovascular: Negative for chest pain.  Gastrointestinal: Negative for abdominal pain and diarrhea.  Genitourinary: Negative for frequency and hematuria.  Musculoskeletal:  Negative for back pain.  Skin: Negative for rash.  Neurological: Positive for weakness. Negative for focal weakness, seizures and headaches.  Psychiatric/Behavioral: Negative for hallucinations.     Physical Exam Updated Vital Signs BP 122/74   Pulse 78   Temp 97.5 F (36.4 C) (Oral)   Resp 26   Ht 5\' 8"  (1.727 m)   Wt 146 lb (66.2 kg)   SpO2 100%   BMI 22.20 kg/m   Physical Exam  Constitutional: He is oriented to person, place, and time. He appears well-developed.  HENT:  Head: Normocephalic.  Eyes: Conjunctivae and EOM are normal. No scleral icterus.  Neck: Neck supple. No thyromegaly present.  Cardiovascular: Normal rate and regular rhythm.  Exam reveals no gallop and no friction rub.   No murmur heard. Pulmonary/Chest: No stridor. He has no wheezes. He has no rales. He exhibits no tenderness.  Abdominal: He exhibits no distension. There is no tenderness. There is no rebound.  Genitourinary:  Genitourinary Comments: Rectal  exam brown stool heme-positive  Musculoskeletal: Normal range of motion. He exhibits no edema.  Lymphadenopathy:    He has no cervical adenopathy.  Neurological: He is oriented to person, place, and time. He exhibits normal muscle tone. Coordination normal.  Skin: No rash noted. No erythema.  Psychiatric: He has a normal mood and affect. His behavior is normal.     ED Treatments / Results  Labs (all labs ordered are listed, but only abnormal results are displayed) Labs Reviewed  BASIC METABOLIC PANEL - Abnormal; Notable for the following:       Result Value   Sodium 148 (*)    Potassium 2.5 (*)    Glucose, Bld 201 (*)    BUN 22 (*)    Creatinine, Ser 1.94 (*)    Calcium 7.3 (*)    GFR calc non Af Amer 32 (*)    GFR calc Af Amer 37 (*)    All other components within normal limits  CBC - Abnormal; Notable for the following:    RBC 2.52 (*)    Hemoglobin 7.1 (*)    HCT 21.7 (*)    RDW 17.3 (*)    All other components within normal limits  CBG MONITORING, ED - Abnormal; Notable for the following:    Glucose-Capillary 187 (*)    All other components within normal limits  URINALYSIS, ROUTINE W REFLEX MICROSCOPIC  TYPE AND SCREEN  PREPARE RBC (CROSSMATCH)    EKG  EKG Interpretation None       Radiology No results found.  Procedures Procedures (including critical care time)  Medications Ordered in ED Medications  potassium chloride 30 mEq in sodium chloride 0.9 % 265 mL (KCL MULTIRUN) IVPB (not administered)  sodium chloride 0.9 % bolus 1,000 mL (0 mLs Intravenous Stopped 03/14/16 1500)  0.9 %  sodium chloride infusion (10 mL/hr Intravenous New Bag/Given 03/14/16 1500)     Initial Impression / Assessment and Plan / ED Course  I have reviewed the triage vital signs and the nursing notes.  Pertinent labs & imaging results that were available during my care of the patient were reviewed by me and considered in my medical decision making (see chart for  details).  Clinical Course     CRITICAL CARE Performed by: Mishika Flippen L Total critical care time:40 minutes Critical care time was exclusive of separately billable procedures and treating other patients. Critical care was necessary to treat or prevent imminent or life-threatening deterioration. Critical care was time spent personally  by me on the following activities: development of treatment plan with patient and/or surrogate as well as nursing, discussions with consultants, evaluation of patient's response to treatment, examination of patient, obtaining history from patient or surrogate, ordering and performing treatments and interventions, ordering and review of laboratory studies, ordering and review of radiographic studies, pulse oximetry and re-evaluation of patient's condition.  Patient with anemia most likely from his achalasia and esophagitis. Lower GI consult would and patient will be transfused and admitted to medicine Final Clinical Impressions(s) / ED Diagnoses   Final diagnoses:  Iron deficiency anemia due to chronic blood loss    New Prescriptions New Prescriptions   No medications on file     Milton Ferguson, MD 03/14/16 1521

## 2016-03-14 NOTE — ED Triage Notes (Signed)
Per [facility], Pt, from [facility], c/o weakness x "over a month" and BLE swelling x 1 week.  Denies pain.  Pt presents w/ paperwork showing Hgb 6.8.  Per chart review, Pt was seen x 1 month ago for same.  Pt reports that he sits in his wheelchair all the time.

## 2016-03-14 NOTE — ED Notes (Signed)
Bed: [id number] Expected date:  Expected time:  Means of arrival:  Comments: EMS- [place] Pt, Hgb 6.5

## 2016-03-14 NOTE — Consult Note (Signed)
Consultation  Referring Provider:    Dr. Roderic Palau  Primary Care Physician:  [name], FNP Primary Gastroenterologist:  Dr. Ardis Hughs  (Dr. Henrene Pastor 2009)    Reason for Consultation: Heme + stool, Anemia, H/o Achalasia             HPI:   [name] is a 75 y.o. African American male with a past medical history of diabetes, hypertension, hyperlipidemia, metastatic prostate cancer, treated with radiation and seed implantation as well as androgen deprivation therapy, who presented to the ED today from his skilled nursing facility for weakness and hemoglobin of 6.8. He was recently discharged from the hospital on 02/29/16 after being treated for achalasia. 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   Today, the patient is found laying in bed with his wife by his bedside. The patient is a very poor historian and his wife is not able to provide much history either. The patient's wife tells me she is concerned that his achalasia has started to get worse again over the past week as he is only eating a few bites of his food. He has continued to drink liquids though. He is still on a full liquid diet at the nursing facility. She tells me she is not aware of what his bowel movements are or if there has been any blood in these, but she has not been told that there was. Patient also denies any change in bowel habits or seeing blood in his stool. He vaguely describes some occasional diarrhea. He tells me, "I'm not sure why I'm here".  ED course: Sodium 140, potassium 2.5, BUN 22, creatinine 1.9, hemoglobin 7.1  Past GI history: 03/08/16-phone call, Dr. Havery Moros: patient has severe achalasia, h/o aspiration pneumonia, is recommended he remain on a full liquid diet indefinitely 02/28/16-EGD, Dr. Havery Moros: dilation of the upper third of the esophagus and in the mouth through the esophagus, with  residual liquid contents needing suctioning, previously noted esophagitis. Improved, large amount of food residue in the stomach, air in the lower esophageal stenosis. Botulinum toxin was injected 2009-colonoscopy: Scattered pan diverticulosis and prolapsed type small rectal polyp 1984-esophageal manometry: Suggested achalasia  Past Medical History:  Diagnosis Date  . Cataract   . CKD (chronic kidney disease) 02/2016  . Diabetes mellitus, type 2 ([place]) 2008  . Hyperlipidemia 2008  . Hypertension 2008  . Prostate cancer ([place]) 2006    Past Surgical History:  Procedure Laterality Date  . COLONOSCOPY W/ POLYPECTOMY  03/2008   diminutive rectal polyp (path: prolapse type polyp, not adenoma).  moderate pan-diverticulitis.   Marland Kitchen ESOPHAGEAL MANOMETRY  1984   unable to pass manometry catheter beyond LES. Aperiystalsis of esophagus: sugg of achalasia.   Marland Kitchen ESOPHAGOGASTRODUODENOSCOPY N/A 02/17/2016   Procedure: ESOPHAGOGASTRODUODENOSCOPY (EGD);  Surgeon: [name], MD;  Location: [facility];  Service: Endoscopy;  Laterality: N/A;  . ESOPHAGOGASTRODUODENOSCOPY (EGD) WITH PROPOFOL N/A 02/21/2016   Procedure: ESOPHAGOGASTRODUODENOSCOPY (EGD) WITH PROPOFOL;  Surgeon: Jerene Bears, MD;  Location: [facility];  Service: Gastroenterology;  Laterality: N/A;  . ESOPHAGOGASTRODUODENOSCOPY (EGD) WITH PROPOFOL N/A 02/28/2016   Procedure: ESOPHAGOGASTRODUODENOSCOPY (EGD) WITH PROPOFOL withBOTOX;  Surgeon: Manus Gunning, MD;  Location: [facility];  Service: Gastroenterology;  Laterality: N/A;  . PROSTATE SURGERY     radiation therapy only    Family History  Problem Relation Age of Onset  .  Diabetes Father   . Diabetes Sister   . Hypertension Sister   . Hypertension Brother   . Heart disease Neg Hx     Social History  Substance Use Topics  . Smoking status: Former Smoker    Years: 10.00    Quit date: 2000  . Smokeless tobacco: Never Used  . Alcohol use No    Prior to Admission  medications   Medication Sig Start Date End Date Taking? Authorizing Provider  acetaminophen (TYLENOL) 650 MG CR tablet Take 650 mg by mouth every 4 (four) hours as needed for pain.    Historical Provider, MD  atenolol (TENORMIN) 25 MG tablet Take 25 mg by mouth daily.    Historical Provider, MD  atorvastatin (LIPITOR) 10 MG tablet Take 1 tablet (10 mg total) by mouth daily after supper. 10/10/15   Elayne Snare, MD  docusate sodium (COLACE) 100 MG capsule Take 1 capsule (100 mg total) by mouth 2 (two) times daily. 02/02/16   Donne Hazel, MD  enoxaparin (LOVENOX) 60 MG/0.6ML injection Inject 0.6 mLs (60 mg total) into the skin daily. 02/02/16   Donne Hazel, MD  feeding supplement, GLUCERNA SHAKE, (GLUCERNA SHAKE) LIQD Take 237 mLs by mouth 2 (two) times daily between meals.    Historical Provider, MD  ferrous sulfate 325 (65 FE) MG tablet Take 1 tablet (325 mg total) by mouth daily with breakfast. 02/02/16   Donne Hazel, MD  furosemide (LASIX) 40 MG tablet Take 40 mg by mouth daily.  01/11/16   Historical Provider, MD  glipiZIDE (GLUCOTROL XL) 2.5 MG 24 hr tablet Take 2.5 mg by mouth daily with breakfast.    Historical Provider, MD  hydrocortisone (CORTEF) 10 MG tablet Take 1 tablet (10 mg total) by mouth 2 (two) times daily. 02/29/16   Barton Dubois, MD  ONE TOUCH ULTRA TEST test strip USE TO TEST ONCE DAILY 08/29/15   Elayne Snare, MD  [facility] DELICA LANCETS 99991111 MISC USE TO TEST ONCE DAILY Dx code E11.65 01/04/16   Elayne Snare, MD  pantoprazole (PROTONIX) 40 MG tablet Take 1 tablet (40 mg total) by mouth 2 (two) times daily. 02/29/16   Barton Dubois, MD  pioglitazone (ACTOS) 30 MG tablet TAKE 1 TABLET BY MOUTH EVERY DAY Patient taking differently: TAKE 30mg  TABLET BY MOUTH EVERY DAY 01/02/16   Elayne Snare, MD  polyethylene glycol (MIRALAX / GLYCOLAX) packet Take 17 g by mouth daily. 02/03/16   Donne Hazel, MD  potassium chloride SA (KLOR-CON M20) 20 MEQ tablet Take 1 tablet (20 mEq total) by  mouth daily. 09/01/15   [name], FNP  TRADJENTA 5 MG TABS tablet TAKE 1 TABLET (5 MG TOTAL) BY MOUTH DAILY. 02/24/16   Elayne Snare, MD  ZYTIGA 250 MG tablet TAKE 4 TABLETS (1,000MG ) BY MOUTH EVERY DAY ON AN EMPTY STOMACH 1 HOURBEFORE OR 2 HOURS AFTER A MEAL Patient not taking: Reported on 02/15/2016 01/10/16   Wyatt Portela, MD    Current Facility-Administered Medications  Medication Dose Route Frequency Provider Last Rate Last Dose  . 0.9 %  sodium chloride infusion  10 mL/hr Intravenous Once Milton Ferguson, MD      . sodium chloride 0.9 % bolus 1,000 mL  1,000 mL Intravenous Once Milton Ferguson, MD       Current Outpatient Prescriptions  Medication Sig Dispense Refill  . acetaminophen (TYLENOL) 650 MG CR tablet Take 650 mg by mouth every 4 (four) hours as needed for pain.    Marland Kitchen  atenolol (TENORMIN) 25 MG tablet Take 25 mg by mouth daily.    Marland Kitchen atorvastatin (LIPITOR) 10 MG tablet Take 1 tablet (10 mg total) by mouth daily after supper. 90 tablet 0  . docusate sodium (COLACE) 100 MG capsule Take 1 capsule (100 mg total) by mouth 2 (two) times daily. 10 capsule 0  . enoxaparin (LOVENOX) 60 MG/0.6ML injection Inject 0.6 mLs (60 mg total) into the skin daily. 0 Syringe   . feeding supplement, GLUCERNA SHAKE, (GLUCERNA SHAKE) LIQD Take 237 mLs by mouth 2 (two) times daily between meals.    . ferrous sulfate 325 (65 FE) MG tablet Take 1 tablet (325 mg total) by mouth daily with breakfast. 30 tablet 0  . furosemide (LASIX) 40 MG tablet Take 40 mg by mouth daily.     Marland Kitchen glipiZIDE (GLUCOTROL XL) 2.5 MG 24 hr tablet Take 2.5 mg by mouth daily with breakfast.    . hydrocortisone (CORTEF) 10 MG tablet Take 1 tablet (10 mg total) by mouth 2 (two) times daily.    . ONE TOUCH ULTRA TEST test strip USE TO TEST ONCE DAILY 50 each 3  . ONETOUCH DELICA LANCETS 99991111 MISC USE TO TEST ONCE DAILY Dx code E11.65 100 each 0  . pantoprazole (PROTONIX) 40 MG tablet Take 1 tablet (40 mg total) by mouth 2 (two) times  daily.    . pioglitazone (ACTOS) 30 MG tablet TAKE 1 TABLET BY MOUTH EVERY DAY (Patient taking differently: TAKE 30mg  TABLET BY MOUTH EVERY DAY) 90 tablet 0  . polyethylene glycol (MIRALAX / GLYCOLAX) packet Take 17 g by mouth daily. 14 each 0  . potassium chloride SA (KLOR-CON M20) 20 MEQ tablet Take 1 tablet (20 mEq total) by mouth daily. 90 tablet 1  . TRADJENTA 5 MG TABS tablet TAKE 1 TABLET (5 MG TOTAL) BY MOUTH DAILY. 90 tablet 0  . ZYTIGA 250 MG tablet TAKE 4 TABLETS (1,000MG ) BY MOUTH EVERY DAY ON AN EMPTY STOMACH 1 HOURBEFORE OR 2 HOURS AFTER A MEAL (Patient not taking: Reported on 02/15/2016) 120 tablet 0    Allergies as of 03/14/2016  . (No Known Allergies)     Review of Systems:   (limited due to mental status)  Constitutional: No weight loss HEENT: Eyes: No change in vision               Ears, Nose, Throat:  No change in hearing  Skin: No rash Cardiovascular: No chest pain Respiratory: No SOB  Gastrointestinal: See HPI and otherwise negative Neurological: No syncope Musculoskeletal: No new muscle or joint pain Hematologic: No bleeding Psychiatric: No history of depression or anxiety   Physical Exam:  Vital signs in last 24 hours: Temp:  [97.5 F (36.4 C)] 97.5 F (36.4 C) (12/06 1303) Pulse Rate:  [79] 79 (12/06 1303) Resp:  [22] 22 (12/06 1303) BP: (135)/(71) 135/71 (12/06 1303) SpO2:  [95 %-100 %] 100 % (12/06 1303) Weight:  [146 lb (66.2 kg)] 146 lb (66.2 kg) (12/06 1305)   General: [place] American male appears to be in NAD, Well developed, Well nourished, alert Head:  Normocephalic and atraumatic. Eyes:   PEERL, EOMI. No icterus. Conjunctiva pink. Ears:  Normal auditory acuity. Neck:  Supple Throat: Oral cavity and pharynx without inflammation, swelling or lesion. Lungs: Respirations even and unlabored. Lungs clear to auscultation bilaterally.   No wheezes, crackles, or rhonchi.  Heart: Normal S1, S2. No MRG. Regular rate and rhythm. No peripheral edema,  cyanosis or pallor.  Abdomen:  Soft, nondistended, nontender. No rebound or guarding. Normal bowel sounds. No appreciable masses or hepatomegaly. Rectal: Heme positive per EDP (brown stool with some red streaks) Msk:  Symmetrical without gross deformities. Peripheral pulses intact.  Extremities:  Without edema, no deformity or joint abnormality. Normal ROM, normal sensation. Neurologic:  Alert and  oriented x3;  grossly normal neurologically.   Skin:   Dry and intact without significant lesions or rashes. Psychiatric: Impaired memory, becomes quite anxious when procedures are mentioned   LAB RESULTS:  Recent Labs  03/14/16 1324  WBC 7.6  HGB 7.1*  HCT 21.7*  PLT 244   BMET No results for input(s): NA, K, CL, CO2, GLUCOSE, BUN, CREATININE, CALCIUM in the last 72 hours. LFT No results for input(s): PROT, ALBUMIN, AST, ALT, ALKPHOS, BILITOT, BILIDIR, IBILI in the last 72 hours. PT/INR No results for input(s): LABPROT, INR in the last 72 hours.  STUDIES: No results found.   PREVIOUS ENDOSCOPIES:            See HPI; more in Chart Review   Impression / Plan:   Impression: 1. Anemia: hgb 10.5 on 02/29/16 down to 7.1 on 03/14/16, FOBT + at time of admission, recent EGD 02/28/16 with Botox injections for achalasia, consider lower GI source of blood loss versus other 2. FOBT + stool: in ER, patient denies seeing blood in his stool previously 3. Hypokalemia: Potassium 2.5 at time of admission, IV potassium has been ordered by hospitalist 4. Chronic DVT: maintained on lovenox, being held  Plan: 1. Will schedule patient for colonoscopy plus/minus endoscopy tomorrow. This is pending normalization of patient's potassium, ordered recheck in the morning. Currently patient is scheduled for 12:30 procedures tomorrow. 2. Recommend aggressive replacement of potassium overnight so the patient can have procedures tomorrow 3. Agree with one unit PRBCs, continue to monitor hemoglobin every 6 hours  as transfusion as necessary less than 7 4. Patient will remain on clear liquid diet today and nothing by mouth after midnight 5. Hold Lovenox 6. Discussed above with Dr. Fuller Plan, please await any further recommendations  Thank you for your kind consultation, we will continue to follow.  Lavone Nian Lemmon  03/14/2016, 1:52 PM Pager #: [phone]    Attending physician's note   I have taken a history, examined the patient and reviewed the chart. I agree with the Advanced Practitioner's note, impression and recommendations. Recurrent anemia, Hb=7.1, with heme + stool. Achalasia treated with EGD/BoTox in 02/2016 without significant improvement in swallowing. Metastatic prostate cancer. Dementia. Chronic DVT maintained on Lovenox. PET scan in 11/2015 did not show any luminal GI uptake.  Discussed colonoscopy and possible repeat EGD. Pt and family not sure he can drink the prep-will attempt to prep however this may not work. Hold Lovenox. Transfusions to keep Hb >  8. Tentatively scheduled for colonoscopy possible EGD tomorrow if prep is adequate and hypokalemia is improved/corrected.   Lucio Edward, MD [facility] [phone] Mon-Fri 8a-5p [phone] after 5p, weekends, holidays

## 2016-03-14 NOTE — ED Notes (Signed)
GI PA and hospitalist at bedside

## 2016-03-14 NOTE — ED Notes (Signed)
Pt cannot use restroom at this time, aware urine specimen is needed.  

## 2016-03-14 NOTE — H&P (Signed)
History and Physical        Hospital Admission Note Date: 03/14/2016  Patient name: [name] Medical record number: [id number] Date of birth: 1941-02-04 Age: 75 y.o. Gender: male  PCP: [name], FNP   Referring physician: Dr Roderic Palau   Patient coming from: SNF    Chief Complaint:  Sent from skilled nursing facility for anemia   HPI: Patient is a 75 year old male with history of metastatic prostrate CA, diabetes type 2, insulin-dependent, hypertension, anemia, chronic kidney disease stage III, was referred to ED from skilled nursing facility for anemia, hemoglobin was 6.8 at the rehabilitation center. Patient was recently discharged from hospital on  02/29/16 after being treated for GI bleed. Prior to that patient was also admitted in November, 2017 and was found to have chronic DVT of right lower extremity and was placed on Lovenox. Lovenox was resumed at the time of discharge. Patient is a poor historian, denies any obvious hematochezia or melena. Wife at the bedside is not able to provide any history. Fecal occult blood test in ED is positive. Patient reports diarrhea. He is on full liquid diet and reports some fullness after eating. No abdominal pain nausea or vomiting. ED work-up/course:  Sodium 148, potassium 2.5 BUN 22, creatinine 1.9. WBC 7.6, hemoglobin 7.1, hematocrit 21.7. Baseline hemoglobin 10.5 on 11/22 Temp 97.5, respiratory rate 22, pulse 78 BP 122/74, O2 sats 100%  Review of Systems: Positives marked in 'bold' Constitutional: Denies fever, chills, diaphoresis, poor appetite and fatigue.  HEENT: Denies photophobia, eye pain, redness, hearing loss, ear pain, congestion, sore throat, rhinorrhea, sneezing, mouth sores, trouble swallowing, neck pain, neck stiffness and tinnitus.   Respiratory: Denies SOB, DOE, cough, chest tightness,  and wheezing.     Cardiovascular: Denies chest pain, palpitations and leg swelling.  Gastrointestinal: Denies nausea, vomiting, abdominal pain, + diarrhea, constipation,  Genitourinary: Denies dysuria, urgency, frequency, hematuria, flank pain and difficulty urinating.  Musculoskeletal: Denies myalgias, back pain, joint swelling, arthralgias and gait problem.  Skin: Denies pallor, rash and wound.  Neurological: Denies dizziness, seizures, syncope, weakness, light-headedness, numbness and headaches.  Hematological: Denies adenopathy. Easy bruising, personal or family bleeding history  Psychiatric/Behavioral: Denies suicidal ideation, mood changes, confusion, nervousness, sleep disturbance and agitation  Past Medical History: Past Medical History:  Diagnosis Date  . Cataract   . CKD (chronic kidney disease) 02/2016  . Diabetes mellitus, type 2 ([place]) 2008  . Hyperlipidemia 2008  . Hypertension 2008  . Prostate cancer ([place]) 2006    Past Surgical History:  Procedure Laterality Date  . COLONOSCOPY W/ POLYPECTOMY  03/2008   diminutive rectal polyp (path: prolapse type polyp, not adenoma).  moderate pan-diverticulitis.   Marland Kitchen ESOPHAGEAL MANOMETRY  1984   unable to pass manometry catheter beyond LES. Aperiystalsis of esophagus: sugg of achalasia.   Marland Kitchen ESOPHAGOGASTRODUODENOSCOPY N/A 02/17/2016   Procedure: ESOPHAGOGASTRODUODENOSCOPY (EGD);  Surgeon: [name], MD;  Location: [facility];  Service: Endoscopy;  Laterality: N/A;  . ESOPHAGOGASTRODUODENOSCOPY (EGD) WITH PROPOFOL N/A 02/21/2016   Procedure: ESOPHAGOGASTRODUODENOSCOPY (EGD) WITH PROPOFOL;  Surgeon: Jerene Bears, MD;  Location: [facility];  Service: Gastroenterology;  Laterality: N/A;  . ESOPHAGOGASTRODUODENOSCOPY (EGD) WITH PROPOFOL N/A  02/28/2016   Procedure: ESOPHAGOGASTRODUODENOSCOPY (EGD) WITH PROPOFOL withBOTOX;  Surgeon: Manus Gunning, MD;  Location: [facility];  Service: Gastroenterology;  Laterality: N/A;  . PROSTATE SURGERY      radiation therapy only    Medications: Prior to Admission medications   Medication Sig Start Date End Date Taking? Authorizing Provider  acetaminophen (TYLENOL) 650 MG CR tablet Take 650 mg by mouth every 4 (four) hours as needed for pain.   Yes Historical Provider, MD  atenolol (TENORMIN) 25 MG tablet Take 25 mg by mouth daily.   Yes Historical Provider, MD  atorvastatin (LIPITOR) 10 MG tablet Take 1 tablet (10 mg total) by mouth daily after supper. 10/10/15  Yes Elayne Snare, MD  docusate sodium (COLACE) 100 MG capsule Take 1 capsule (100 mg total) by mouth 2 (two) times daily. 02/02/16  Yes Donne Hazel, MD  enoxaparin (LOVENOX) 60 MG/0.6ML injection Inject 0.6 mLs (60 mg total) into the skin daily. 02/02/16  Yes Donne Hazel, MD  ferrous sulfate 325 (65 FE) MG tablet Take 1 tablet (325 mg total) by mouth daily with breakfast. 02/02/16  Yes Donne Hazel, MD  furosemide (LASIX) 40 MG tablet Take 40 mg by mouth daily.  01/11/16  Yes Historical Provider, MD  glipiZIDE (GLUCOTROL XL) 2.5 MG 24 hr tablet Take 2.5 mg by mouth daily with breakfast.   Yes Historical Provider, MD  hydrocortisone (CORTEF) 10 MG tablet Take 1 tablet (10 mg total) by mouth 2 (two) times daily. 02/29/16  Yes Barton Dubois, MD  Multiple Vitamin (MULTIVITAMIN WITH MINERALS) TABS tablet Take 1 tablet by mouth daily.   Yes Historical Provider, MD  pantoprazole (PROTONIX) 40 MG tablet Take 1 tablet (40 mg total) by mouth 2 (two) times daily. 02/29/16  Yes Barton Dubois, MD  pioglitazone (ACTOS) 30 MG tablet TAKE 1 TABLET BY MOUTH EVERY DAY Patient taking differently: TAKE 30mg  TABLET BY MOUTH EVERY DAY 01/02/16  Yes Elayne Snare, MD  polyethylene glycol (MIRALAX / GLYCOLAX) packet Take 17 g by mouth daily. 02/03/16  Yes Donne Hazel, MD  potassium chloride SA (KLOR-CON M20) 20 MEQ tablet Take 1 tablet (20 mEq total) by mouth daily. 09/01/15  Yes [name], FNP  TRADJENTA 5 MG TABS tablet TAKE 1 TABLET (5 MG TOTAL) BY  MOUTH DAILY. 02/24/16  Yes Elayne Snare, MD  ZYTIGA 250 MG tablet TAKE 4 TABLETS (1,000MG ) BY MOUTH EVERY DAY ON AN EMPTY STOMACH 1 HOURBEFORE OR 2 HOURS AFTER A MEAL Patient taking differently: TAKE 1000MG  BY MOUTH EVERY DAY 01/10/16  Yes Wyatt Portela, MD  feeding supplement, GLUCERNA SHAKE, (GLUCERNA SHAKE) LIQD Take 237 mLs by mouth 2 (two) times daily between meals.    Historical Provider, MD  ONE TOUCH ULTRA TEST test strip USE TO TEST ONCE DAILY 08/29/15   Elayne Snare, MD  [facility] DELICA LANCETS 99991111 MISC USE TO TEST ONCE DAILY Dx code E11.65 01/04/16   Elayne Snare, MD    Allergies:  No Known Allergies  Social History:  reports that he quit smoking about 17 years ago. He quit after 10.00 years of use. He has never used smokeless tobacco. He reports that he does not drink alcohol or use drugs.  Family History: Family History  Problem Relation Age of Onset  . Diabetes Father   . Diabetes Sister   . Hypertension Sister   . Hypertension Brother   . Heart disease Neg Hx     Physical Exam: Blood pressure 122/74, pulse  78, temperature 97.5 F (36.4 C), temperature source Oral, resp. rate 26, height 5\' 8"  (1.727 m), weight 66.2 kg (146 lb), SpO2 100 %. General: Alert, awake, oriented x3, in no acute distress. HEENT: normocephalic, atraumatic, anicteric sclera, pink conjunctiva, pupils equal and reactive to light and accomodation, oropharynx clear Neck: supple, no masses or lymphadenopathy, no goiter, no bruits  Heart: Regular rate and rhythm, without murmurs, rubs or gallops. Lungs: Clear to auscultation bilaterally, no wheezing, rales or rhonchi. Abdomen: Soft, nontender, nondistended, positive bowel sounds, no masses.Rectal exam heme positive  Per EDP Extremities: No clubbing, cyanosis or edema with positive pedal pulses. Neuro: Grossly intact, no focal neurological deficits, strength 5/5 upper and lower extremities bilaterally Psych: alert and oriented x 3, normal mood and  affect Skin: no rashes or lesions, warm and dry   LABS on Admission:  Basic Metabolic Panel:  Recent Labs Lab 03/14/16 1324  NA 148*  K 2.5*  CL 108  CO2 27  GLUCOSE 201*  BUN 22*  CREATININE 1.94*  CALCIUM 7.3*   Liver Function Tests: No results for input(s): AST, ALT, ALKPHOS, BILITOT, PROT, ALBUMIN in the last 168 hours. No results for input(s): LIPASE, AMYLASE in the last 168 hours. No results for input(s): AMMONIA in the last 168 hours. CBC:  Recent Labs Lab 03/14/16 1324  WBC 7.6  HGB 7.1*  HCT 21.7*  MCV 86.1  PLT 244   Cardiac Enzymes: No results for input(s): CKTOTAL, CKMB, CKMBINDEX, TROPONINI in the last 168 hours. BNP: Invalid input(s): POCBNP CBG:  Recent Labs Lab 03/14/16 1321  GLUCAP 187*    Radiological Exams on Admission:  No results found.  *I have personally reviewed the images above*    Assessment/Plan Principal Problem:   Acute blood loss anemia in the setting of heme positive stools - Hemoglobin 7.1, transfused 2 units of packed RBCs - GI consulted, will follow recommendations, NPO - Hold Lovenox for now, PPI  Active Problems:   Diabetes mellitus type 2 in nonobese (HCC) - Place on sliding scale insulin, follow hemoglobin A1c    Hypokalemia - Placed on IV potassium replacement    Achalasia - Patient did complain of fullness with full liquid diet, will defer to GI regarding endoscopy  Chronic DVT - Hold Lovenox for now  Chronic kidney disease, stage III Creatinine currently at baseline, baseline 2.6   DVT prophylaxis: SCDs  CODE STATUS: Full CODE STATUS, discussed with patient's wife  Consults called: GI  Family Communication: Admission, patients condition and plan of care including tests being ordered have been discussed with the patient and wife who indicates understanding and agree with the plan and Code Status  Admission status: Inpatient, telemetry  Disposition plan: Further plan will depend as patient's  clinical course evolves and further radiologic and laboratory data become available. Likely or skilled nursing facility   At the time of admission, it appears that the appropriate admission status for this patient is INPATIENT . This is judged to be reasonable and necessary in order to provide the required intensity of service to ensure the patient's safety given the presenting symptoms, physical exam findings, and initial radiographic and laboratory data in the context of their chronic comorbidities.   Time Spent on Admission: 79mins   RAI,RIPUDEEP M.D. [facility] 03/14/2016, 3:44 PM Pager: [phone]  If 7PM-7AM, please contact night-coverage [email or web address] Password [email or web address]

## 2016-03-15 ENCOUNTER — Encounter (HOSPITAL_COMMUNITY): Payer: Self-pay | Admitting: Anesthesiology

## 2016-03-15 ENCOUNTER — Encounter (HOSPITAL_COMMUNITY)
Admission: EM | Disposition: A | Discharge status: Skilled Nursing Facility | Payer: Self-pay | Source: Home / Self Care | Attending: Internal Medicine

## 2016-03-15 DIAGNOSIS — E119 Type 2 diabetes mellitus without complications: Secondary | ICD-10-CM

## 2016-03-15 DIAGNOSIS — D649 Anemia, unspecified: Secondary | ICD-10-CM

## 2016-03-15 DIAGNOSIS — K220 Achalasia of cardia: Secondary | ICD-10-CM

## 2016-03-15 LAB — BASIC METABOLIC PANEL
ANION GAP: 11 (ref 5–15)
BUN: 18 mg/dL (ref 6–20)
CHLORIDE: 115 mmol/L — AB (ref 101–111)
CO2: 27 mmol/L (ref 22–32)
Calcium: 7 mg/dL — ABNORMAL LOW (ref 8.9–10.3)
Creatinine, Ser: 1.79 mg/dL — ABNORMAL HIGH (ref 0.61–1.24)
GFR calc Af Amer: 41 mL/min — ABNORMAL LOW (ref >60)
GFR, EST NON AFRICAN AMERICAN: 35 mL/min — AB (ref >60)
GLUCOSE: 107 mg/dL — AB (ref 65–99)
POTASSIUM: 2.6 mmol/L — AB (ref 3.5–5.1)
SODIUM: 153 mmol/L — AB (ref 135–145)

## 2016-03-15 LAB — CBC
HEMATOCRIT: 26.6 % — AB (ref 39.0–52.0)
HEMOGLOBIN: 8.7 g/dL — AB (ref 13.0–17.0)
MCH: 28.8 pg (ref 26.0–34.0)
MCHC: 32.7 g/dL (ref 30.0–36.0)
MCV: 88.1 fL (ref 78.0–100.0)
Platelets: 227 10*3/uL (ref 150–400)
RBC: 3.02 MIL/uL — ABNORMAL LOW (ref 4.22–5.81)
RDW: 16.5 % — AB (ref 11.5–15.5)
WBC: 6 10*3/uL (ref 4.0–10.5)

## 2016-03-15 LAB — HEMOGLOBIN A1C
Hgb A1c MFr Bld: 6.1 % — ABNORMAL HIGH (ref 4.8–5.6)
Mean Plasma Glucose: 128 mg/dL

## 2016-03-15 LAB — GLUCOSE, CAPILLARY
GLUCOSE-CAPILLARY: 81 mg/dL (ref 65–99)
GLUCOSE-CAPILLARY: 86 mg/dL (ref 65–99)
Glucose-Capillary: 83 mg/dL (ref 65–99)
Glucose-Capillary: 154 mg/dL — ABNORMAL HIGH (ref 65–99)

## 2016-03-15 LAB — MAGNESIUM: Magnesium: 1.1 mg/dL — ABNORMAL LOW (ref 1.7–2.4)

## 2016-03-15 SURGERY — CANCELLED PROCEDURE

## 2016-03-15 MED ORDER — MAGNESIUM SULFATE 4 GM/100ML IV SOLN
4.0000 g | Freq: Once | INTRAVENOUS | Status: AC
  Administered 2016-03-15: 4 g via INTRAVENOUS
  Filled 2016-03-15: qty 100

## 2016-03-15 MED ORDER — FUROSEMIDE 10 MG/ML IJ SOLN
20.0000 mg | Freq: Once | INTRAMUSCULAR | Status: AC
  Administered 2016-03-15: 20 mg via INTRAVENOUS
  Filled 2016-03-15: qty 2

## 2016-03-15 MED ORDER — POTASSIUM CHLORIDE CRYS ER 20 MEQ PO TBCR
40.0000 meq | EXTENDED_RELEASE_TABLET | Freq: Once | ORAL | Status: AC
  Administered 2016-03-15: 40 meq via ORAL
  Filled 2016-03-15: qty 2

## 2016-03-15 MED ORDER — POTASSIUM CHLORIDE 2 MEQ/ML IV SOLN
30.0000 meq | Freq: Once | INTRAVENOUS | Status: AC
Start: 2016-03-15 — End: 2016-03-15
  Administered 2016-03-15: 30 meq via INTRAVENOUS
  Filled 2016-03-15: qty 15

## 2016-03-15 MED ORDER — ADULT MULTIVITAMIN W/MINERALS CH
1.0000 | ORAL_TABLET | Freq: Every day | ORAL | Status: DC
Start: 2016-03-15 — End: 2016-03-17
  Administered 2016-03-15 – 2016-03-17 (×3): 1 via ORAL
  Filled 2016-03-15 (×3): qty 1

## 2016-03-15 MED ORDER — ENSURE ENLIVE PO LIQD
237.0000 mL | Freq: Three times a day (TID) | ORAL | Status: DC
  Administered 2016-03-15 – 2016-03-16 (×5): 237 mL via ORAL

## 2016-03-15 SURGICAL SUPPLY — 14 items

## 2016-03-15 NOTE — Progress Notes (Signed)
[facility] Progress Note  CC:  Anemia  Subjective:  Patient didn't/couldn't drink bowel prep.  EGD and colonoscopy canceled.  He wants his diet advanced.  Received 2 units PRBC's.  Per nursing, he's had a few loose, brown stools.  No sign of blood and no black stools.  Objective:  Vital signs in last 24 hours: Temp:  [97.4 F (36.3 C)-98.4 F (36.9 C)] 97.4 F (36.3 C) (12/07 0812) Pulse Rate:  [69-87] 71 (12/07 0812) Resp:  [12-26] 18 (12/07 0812) BP: (115-150)/(60-87) 137/87 (12/07 0812) SpO2:  [95 %-100 %] 100 % (12/07 0812) Weight:  [141 lb 15.6 oz (64.4 kg)-146 lb (66.2 kg)] 141 lb 15.6 oz (64.4 kg) (12/06 1749) Last BM Date: 03/15/16 General:  AA male, Alert, Well-developed, in NAD Heart:  Regular rate and rhythm; no murmurs Pulm:  CTAB.  No W/R/R.  No increased WOB. Abdomen:  Soft, non-distended.  BS present.  Non-tender.  Extremities:  Without edema. Neurologic:  Alert and oriented x 4;  grossly normal neurologically. Psych:  Alert and cooperative. Normal mood and affect.  Intake/Output from previous day: 12/06 0701 - 12/07 0700 In: 1386 [Blood:386; IV Piggyback:1000] Out: -  Intake/Output this shift: Total I/O In: 330 [Blood:330] Out: -   Lab Results:  Recent Labs  03/14/16 1324 03/15/16 0404  WBC 7.6 6.0  HGB 7.1* 8.7*  HCT 21.7* 26.6*  PLT 244 227   BMET  Recent Labs  03/14/16 1324 03/15/16 0404  NA 148* 153*  K 2.5* 2.6*  CL 108 115*  CO2 27 27  GLUCOSE 201* 107*  BUN 22* 18  CREATININE 1.94* 1.79*  CALCIUM 7.3* 7.0*   PT/INR  Recent Labs  03/14/16 1324  LABPROT 13.6  INR 1.04   Assessment / Plan: 1. Anemia:  Hgb improved to 8.7 grams this AM after two units PRBC's.  Recent EGD 02/28/16 with Botox injections for achalasia.  Consider lower GI source of blood loss versus other.  EGD and colonoscopy were planned for today but were cancelled due to inability to take prep. 2. FOBT + stool:  In ER, patient denies  seeing blood in his stool previously. 3. Hypokalemia: Potassium still on 2.6 this AM.  To be replaced by primary team.  4. Chronic DVT:  Maintained on lovenox. 5. Hypernatremia: 6. Metastatic prostate cancer 7. Achalasia:  EGD with Botox 02/28/2016.  To remain on full liquid diet possibly indefinitely. 8. Dementia  -Discussed with patient's wife.  EGD and colonoscopy cancelled.  Plan is going to be for supportive care, transfusions prn.  Hgb should be monitored by PCP as outpatient as well and just transfused as outpatient if needed. -Will restart full liquid diet but should not be advanced further for now.   -Electrolyte imbalances may be causing/contributing to his weakness. -? Ok to resume chronic Lovenox.   LOS: 1 day   ZEHR, JESSICA D.  03/15/2016, 10:06 AM  Pager number [phone]     Attending physician's note   I have taken an interval history, reviewed the chart and examined the patient. I agree with the Advanced Practitioner's note, impression and recommendations. No plans for colonoscopy or EGD. Had EGD in 02/2016 without a source of blood loss noted. Resume full liquid diet for long term use for achalasia. Can resume Lovenox from GI standpoint tomorrow as indicated however this will increase the risk of GI bleeding. Support with transfusions when Hb is low. GI signing off.    Lucio Edward, MD [facility]  [id number] Mon-Fri 8a-5p [phone] after 5p, weekends, holidays

## 2016-03-15 NOTE — Telephone Encounter (Signed)
Pt is admitted to the hospital

## 2016-03-15 NOTE — Telephone Encounter (Signed)
Left message on machine to call back  

## 2016-03-15 NOTE — Progress Notes (Signed)
Initial Nutrition Assessment  DOCUMENTATION CODES:   Not applicable  INTERVENTION:  Provide Ensure Enlive po TID, each supplement provides 350 kcal and 20 grams of protein.  Provide multivitamin with minerals daily for wound healing.  NUTRITION DIAGNOSIS:   Swallowing difficulty related to other (see comment) (severe achalasia, esophagitis) as evidenced by other (see comment) (per chart, need for FLD per GI service.).  GOAL:   Patient will meet greater than or equal to 90% of their needs  MONITOR:   PO intake, Supplement acceptance, Labs, Weight trends, I & O's  REASON FOR ASSESSMENT:   Malnutrition Screening Tool    ASSESSMENT:   75-year-old male with history of metastatic prostrate CA, diabetes type 2, insulin-dependent, hypertension, anemia, chronic kidney disease stage III, was referred to ED from skilled nursing facility for anemia, hemoglobin was 6.8 at the rehabilitation center. Patient was recently discharged from hospital on  02/29/16 after being treated for GI bleed. Prior to that patient was also admitted in November, 2017 and was found to have chronic DVT of right lower extremity and was placed on Lovenox.   Attempted to speak with patient at bedside. He reports he does not want to speak to Dietitian until he is able to have real food. Attempted to explain to patient that diet orders are made by MD and SLP and that RD was here to assist with preventing further weight loss and helping patient meet calorie and protein needs. Patient still refusing to speak to RD and also reports RD cannot call wife. Attempted to call [facility] where patient was at, but phones were not answered.  Able to obtain some information from chart. Patient is on FLD indefinitely per GI pending follow-up in setting of esophagitis, severe achalasia, pneumonia. Per HPI patient had denied abdominal pain or nausea/vomiting. Was reporting fullness after eating.  Patient had reported UBW  of 165 lbs on prior nutrition assessment. Per chart patient lost 25 lbs (16% body weight) over 7 months (05/2015-01/2016) which is significant for time frame. He has since regained from lowest weight of 130 lbs to 141 lbs in the past 2 months.  Medications reviewed and include: Novolog sliding scale TID with meals and daily HS, pantoprazole.  Labs reviewed: CBG 81-108 past 24 hrs, Sodium 153, Potassium 2.6, Chloride 115, Creatinine 1.79, Calcium 7, EGFR 41, Magnesium 1.1, HgbA1c 6.1 on 12/6.  Unable to complete Nutrition-Focused physical exam at this time as patient refusing.   Discussed with RN. Patient has had a difficult day today. EGD and Colonoscopy were canceled today after he had attempted the bowel prep already. Per RN patient amenable to Ensure Enlive.  Diet Order:  Diet full liquid Room service appropriate? Yes; Fluid consistency: Thin  Skin:  Wound (see comment) (Stg II to Coccyx & Sacrum)  Last BM:  03/15/2016  Height:   Ht Readings from Last 1 Encounters:  03/14/16 5' 8" (1.727 m)    Weight:   Wt Readings from Last 1 Encounters:  03/14/16 141 lb 15.6 oz (64.4 kg)    Ideal Body Weight:  71.36 kg  BMI:  Body mass index is 21.59 kg/m.  Estimated Nutritional Needs:   Kcal:  1800-2060 (28-32 kcal/kg)  Protein:  84-97 grams (1.3-1.5 grams/kg)  Fluid:  >/= 1.8 L/day  EDUCATION NEEDS:   Education needs no appropriate at this time   Stephens, MS, RD, LDN Pager: [phone] After Hours Pager: [phone]  

## 2016-03-15 NOTE — Progress Notes (Signed)
CSW received consult that patient was admitted from [facility], [place] familiar with patient as this CSW discharged patient to [facility] the last time he was in the hospital (02/29/16). CSW confirmed with patient's wife, Otho Perl that she plans for him to return to [facility] at discharge. CSW completed FL2 and confirmed with [facility] that they would be able to take patient back at discharge, if they have a bed available.    Raynaldo Opitz, [facility] Clinical Social Worker cell #: [phone]

## 2016-03-15 NOTE — NC FL2 (Signed)
[place] LEVEL OF CARE SCREENING TOOL     IDENTIFICATION  Patient Name: [name] Birthdate: Sep 22, 1940 Sex: male Admission Date (Current Location): 03/14/2016  [facility] and [place] Number:  Herbalist and Address:  [facility],  [place], [place]      Provider Number: [id number]  Attending Physician Name and Address:  Donne Hazel, MD  Relative Name and Phone Number:       Current Level of Care: Hospital Recommended Level of Care: [place] Prior Approval Number:    Date Approved/Denied:   PASRR Number: [id number]  Discharge Plan: SNF    Current Diagnoses: Patient Active Problem List   Diagnosis Date Noted  . Symptomatic anemia 03/14/2016  . Sepsis ([place]) 02/21/2016  . Heme positive stool   . Pressure injury of skin 02/19/2016  . Hypernatremia 02/19/2016  . Acute esophagitis   . Food impaction of esophagus   . Achalasia   . Acute GI bleeding 02/16/2016  . Acute blood loss anemia 02/16/2016  . Anemia 02/15/2016  . Weakness 01/29/2016  . Right leg weakness 01/29/2016  . HCAP (healthcare-associated pneumonia) 01/28/2016  . Dehydration   . Hypomagnesemia   . Protein-calorie malnutrition, severe 01/09/2016  . Hyperglycemia 01/08/2016  . Hypokalemia 01/08/2016  . Acute kidney injury superimposed on chronic kidney disease ([place]) 01/08/2016  . Prolonged Q-T interval on ECG 01/08/2016  . 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 Bilateral lower extremity edema 09/07/2013  . Type II or unspecified type diabetes mellitus without mention of complication, uncontrolled 05/20/2013  . Pulmonary embolus ([place]) 09/01/2010  . Diabetes mellitus type 2 in nonobese ([place]) 09/15/2006  . Hyperlipidemia associated with type 2 diabetes mellitus ([place]) 09/13/2006  . Essential hypertension 09/13/2006  . Disorder resulting from impaired renal function 09/13/2006  . PROSTATE CANCER, HX OF 09/13/2006    Orientation  RESPIRATION BLADDER Height & Weight     Self, Time  Normal Incontinent Weight: 141 lb 15.6 oz (64.4 kg) Height:  5\' 8"  (172.7 cm)  BEHAVIORAL SYMPTOMS/MOOD NEUROLOGICAL BOWEL NUTRITION STATUS      Incontinent Diet (Full Liquid Diet)  AMBULATORY STATUS COMMUNICATION OF NEEDS Skin   Extensive Assist Verbally  (Pressure Injury 03/14/16 Stage II -  Partial thickness loss of dermis presenting as a shallow open ulcer with a red, pink wound bed without slough. (coccyx))                       Personal Care Assistance Level of Assistance  Bathing, Dressing, Feeding Bathing Assistance: Limited assistance Feeding assistance: Limited assistance Dressing Assistance: Limited assistance     Functional Limitations Info    Sight Info: Adequate Hearing Info: Adequate Speech Info: Adequate    SPECIAL CARE FACTORS FREQUENCY  PT (By licensed PT), OT (By licensed OT)     PT Frequency: 5 OT Frequency: 5            Contractures      Additional Factors Info  Code Status, Allergies Code Status Info: Fullcode Allergies Info: NKDA           Current Medications (03/15/2016):  This is the current hospital active medication list Current Facility-Administered Medications  Medication Dose Route Frequency Provider Last Rate Last Dose  . 0.9 %  sodium chloride infusion   Intravenous Continuous Ripudeep K Rai, MD      . 0.9 %  sodium chloride infusion   Intravenous Continuous Levin Erp,  PA      . acetaminophen (TYLENOL) tablet 650 mg  650 mg Oral Q6H PRN Ripudeep Krystal Eaton, MD       Or  . acetaminophen (TYLENOL) suppository 650 mg  650 mg Rectal Q6H PRN Ripudeep K Rai, MD      . atenolol (TENORMIN) tablet 25 mg  25 mg Oral Daily Ripudeep K Rai, MD   25 mg at 03/15/16 1034  . atorvastatin (LIPITOR) tablet 10 mg  10 mg Oral QPC supper Ripudeep Krystal Eaton, MD   10 mg at 03/14/16 2020  . hydrALAZINE (APRESOLINE) injection 10 mg  10 mg Intravenous Q6H PRN Ripudeep K Rai, MD      .  HYDROcodone-acetaminophen (NORCO/VICODIN) 5-325 MG per tablet 1-2 tablet  1-2 tablet Oral Q4H PRN Ripudeep K Rai, MD      . hydrocortisone (CORTEF) tablet 10 mg  10 mg Oral BID Ripudeep Krystal Eaton, MD   10 mg at 03/15/16 1107  . insulin aspart (novoLOG) injection 0-5 Units  0-5 Units Subcutaneous QHS Ripudeep K Rai, MD      . insulin aspart (novoLOG) injection 0-9 Units  0-9 Units Subcutaneous TID WC Ripudeep K Rai, MD      . pantoprazole (PROTONIX) EC tablet 40 mg  40 mg Oral BID Ripudeep Krystal Eaton, MD   40 mg at 03/15/16 1034  . potassium chloride SA (K-DUR,KLOR-CON) CR tablet 40 mEq  40 mEq Oral Once Donne Hazel, MD      . sodium chloride flush (NS) 0.9 % injection 3 mL  3 mL Intravenous Q12H Ripudeep K Rai, MD   3 mL at 03/15/16 1000     Discharge Medications: Please see discharge summary for a list of discharge medications.  Relevant Imaging Results:  Relevant Lab Results:   Additional Information SSN [id number]  Standley Brooking, LCSW

## 2016-03-15 NOTE — Progress Notes (Signed)
PROGRESS NOTE    [name]  [id number] DOB: June 30, 1940 DOA: 03/14/2016 PCP: Elayne Snare, MD    Brief Narrative:  75 year old male with history of metastatic prostrate CA, diabetes type 2, insulin-dependent, hypertension, anemia, chronic kidney disease stage III, was referred to ED from skilled nursing facility for anemia, hemoglobin was 6.8 at the rehabilitation center. Patient was recently discharged from hospital on  02/29/16 after being treated for GI bleed. Prior to that patient was also admitted in November, 2017 and was found to have chronic DVT of right lower extremity and was placed on Lovenox. Lovenox was resumed at the time of discharge. In the ED, patient found to be anemic with heme pos stools. Patient was admitted for further work up.  Assessment & Plan:   Principal Problem:   Acute blood loss anemia Active Problems:   Diabetes mellitus type 2 in nonobese (HCC)   Hypokalemia   Achalasia   Heme positive stool   Symptomatic anemia   Principal Problem:   Acute blood loss anemia in the setting of heme positive stools - Presenting Hemoglobin noted to be 7.1, transfused 2 units of packed RBCs - GI was consulted, initial plans for EGD and colonoscopy, now cancelled with recommendations for supportive care and transfusions as needed - Per GI, ? If possible to resume anticoagulation. Will f/u on recommendations - Repeat CBC in AM  Active Problems:   Diabetes mellitus type 2 in nonobese (HCC) - Continue on SSI  - Glucose stable    Hypokalemia - Given IV K overnight - Will prescribe additional 35meq x 1    Achalasia - Patient did complain of fullness with full liquid diet - Appears stable at this time  Chronic DVT - Held anticoagulation on admit - would resume when OK with GI  Chronic kidney disease, stage III Creatinine currently at baseline, baseline 2.6 - Repeat BMET in AM  DVT prophylaxis: SCD's Code Status: Full Family Communication: Pt in  room, family at bedside Disposition Plan: Possible home in 24hrs if stable  Consultants:   GI  Procedures:     Antimicrobials: Anti-infectives    None      Subjective: No complaints today  Objective: Vitals:   03/15/16 0350 03/15/16 0433 03/15/16 0812 03/15/16 1156  BP: (!) 141/67 136/74 137/87 137/79  Pulse: 70 70 71 71  Resp: 18 18 18 18   Temp: 97.8 F (36.6 C) 97.8 F (36.6 C) 97.4 F (36.3 C) 97.6 F (36.4 C)  TempSrc: Oral Oral Oral Oral  SpO2: 100% 100% 100% 100%  Weight:      Height:        Intake/Output Summary (Last 24 hours) at 03/15/16 1507 Last data filed at 03/15/16 0750  Gross per 24 hour  Intake              716 ml  Output                0 ml  Net              716 ml   Filed Weights   03/14/16 1305 03/14/16 1749  Weight: 66.2 kg (146 lb) 64.4 kg (141 lb 15.6 oz)    Examination:  General exam: Appears calm and comfortable  Respiratory system: Clear to auscultation. Respiratory effort normal. Cardiovascular system: S1 & S2 heard, RRR Gastrointestinal system: Abdomen is nondistended, soft and nontender. No organomegaly or masses felt. Normal bowel sounds heard. Central nervous system: Alert and oriented. No focal neurological deficits. Extremities:  Symmetric 5 x 5 power. Skin: No rashes, lesions  Psychiatry: Judgement and insight appear normal. Mood & affect appropriate.   Data Reviewed: I have personally reviewed following labs and imaging studies  CBC:  Recent Labs Lab 03/14/16 1324 03/15/16 0404  WBC 7.6 6.0  HGB 7.1* 8.7*  HCT 21.7* 26.6*  MCV 86.1 88.1  PLT 244 [id number]   Basic Metabolic Panel:  Recent Labs Lab 03/14/16 1324 03/15/16 0404  NA 148* 153*  K 2.5* 2.6*  CL 108 115*  CO2 27 27  GLUCOSE 201* 107*  BUN 22* 18  CREATININE 1.94* 1.79*  CALCIUM 7.3* 7.0*  MG  --  1.1*   GFR: Estimated Creatinine Clearance: 32.5 mL/min (by C-G formula based on SCr of 1.79 mg/dL (H)). Liver Function Tests: No results for  input(s): AST, ALT, ALKPHOS, BILITOT, PROT, ALBUMIN in the last 168 hours. No results for input(s): LIPASE, AMYLASE in the last 168 hours. No results for input(s): AMMONIA in the last 168 hours. Coagulation Profile:  Recent Labs Lab 03/14/16 1324  INR 1.04   Cardiac Enzymes: No results for input(s): CKTOTAL, CKMB, CKMBINDEX, TROPONINI in the last 168 hours. BNP (last 3 results) No results for input(s): PROBNP in the last 8760 hours. HbA1C:  Recent Labs  03/14/16 1830  HGBA1C 6.1*   CBG:  Recent Labs Lab 03/14/16 1321 03/14/16 2137 03/15/16 0753 03/15/16 1155  GLUCAP 187* 108* 81 83   Lipid Profile: No results for input(s): CHOL, HDL, LDLCALC, TRIG, CHOLHDL, LDLDIRECT in the last 72 hours. Thyroid Function Tests: No results for input(s): TSH, T4TOTAL, FREET4, T3FREE, THYROIDAB in the last 72 hours. Anemia Panel: No results for input(s): VITAMINB12, FOLATE, FERRITIN, TIBC, IRON, RETICCTPCT in the last 72 hours. Sepsis Labs: No results for input(s): PROCALCITON, LATICACIDVEN in the last 168 hours.  No results found for this or any previous visit (from the past 240 hour(s)).   Radiology Studies: No results found.  Scheduled Meds: . atenolol  25 mg Oral Daily  . atorvastatin  10 mg Oral QPC supper  . feeding supplement (ENSURE ENLIVE)  237 mL Oral TID BM  . hydrocortisone  10 mg Oral BID  . insulin aspart  0-5 Units Subcutaneous QHS  . insulin aspart  0-9 Units Subcutaneous TID WC  . multivitamin with minerals  1 tablet Oral Daily  . pantoprazole  40 mg Oral BID  . sodium chloride flush  3 mL Intravenous Q12H   Continuous Infusions: . sodium chloride    . sodium chloride       LOS: 1 day   CHIU, Orpah Melter, MD [facility] Pager [phone]  If 7PM-7AM, please contact night-coverage [email or web address] Password [email or web address] 03/15/2016, 3:07 PM

## 2016-03-16 LAB — BASIC METABOLIC PANEL
ANION GAP: 10 (ref 5–15)
ANION GAP: 7 (ref 5–15)
BUN: 15 mg/dL (ref 6–20)
BUN: 14 mg/dL (ref 6–20)
CHLORIDE: 115 mmol/L — AB (ref 101–111)
CHLORIDE: 116 mmol/L — AB (ref 101–111)
CO2: 26 mmol/L (ref 22–32)
CO2: 26 mmol/L (ref 22–32)
Calcium: 7 mg/dL — ABNORMAL LOW (ref 8.9–10.3)
Calcium: 7.1 mg/dL — ABNORMAL LOW (ref 8.9–10.3)
Creatinine, Ser: 1.67 mg/dL — ABNORMAL HIGH (ref 0.61–1.24)
Creatinine, Ser: 1.63 mg/dL — ABNORMAL HIGH (ref 0.61–1.24)
GFR calc Af Amer: 45 mL/min — ABNORMAL LOW (ref >60)
GFR calc Af Amer: 46 mL/min — ABNORMAL LOW (ref >60)
GFR, EST NON AFRICAN AMERICAN: 38 mL/min — AB (ref >60)
GFR, EST NON AFRICAN AMERICAN: 40 mL/min — AB (ref >60)
GLUCOSE: 121 mg/dL — AB (ref 65–99)
Glucose, Bld: 161 mg/dL — ABNORMAL HIGH (ref 65–99)
POTASSIUM: 2.7 mmol/L — AB (ref 3.5–5.1)
POTASSIUM: 3.5 mmol/L (ref 3.5–5.1)
SODIUM: 151 mmol/L — AB (ref 135–145)
SODIUM: 149 mmol/L — AB (ref 135–145)

## 2016-03-16 LAB — TYPE AND SCREEN
ABO/RH(D): B POS
Antibody Screen: NEGATIVE
UNIT DIVISION: 0
UNIT DIVISION: 0

## 2016-03-16 LAB — CBC
HEMATOCRIT: 30.7 % — AB (ref 39.0–52.0)
HEMOGLOBIN: 10.5 g/dL — AB (ref 13.0–17.0)
MCH: 29.3 pg (ref 26.0–34.0)
MCHC: 34.2 g/dL (ref 30.0–36.0)
MCV: 85.8 fL (ref 78.0–100.0)
Platelets: 231 10*3/uL (ref 150–400)
RBC: 3.58 MIL/uL — ABNORMAL LOW (ref 4.22–5.81)
RDW: 16.8 % — AB (ref 11.5–15.5)
WBC: 6.6 10*3/uL (ref 4.0–10.5)

## 2016-03-16 LAB — MAGNESIUM
MAGNESIUM: 1.9 mg/dL (ref 1.7–2.4)
Magnesium: 1.9 mg/dL (ref 1.7–2.4)

## 2016-03-16 LAB — GLUCOSE, CAPILLARY
GLUCOSE-CAPILLARY: 133 mg/dL — AB (ref 65–99)
GLUCOSE-CAPILLARY: 155 mg/dL — AB (ref 65–99)
GLUCOSE-CAPILLARY: 153 mg/dL — AB (ref 65–99)
Glucose-Capillary: 215 mg/dL — ABNORMAL HIGH (ref 65–99)

## 2016-03-16 MED ORDER — ENOXAPARIN SODIUM 60 MG/0.6ML ~~LOC~~ SOLN
60.0000 mg | SUBCUTANEOUS | Status: DC
  Administered 2016-03-16 – 2016-03-17 (×2): 60 mg via SUBCUTANEOUS
  Filled 2016-03-16 (×2): qty 0.6

## 2016-03-16 MED ORDER — ENOXAPARIN SODIUM 60 MG/0.6ML ~~LOC~~ SOLN: 60.0000 mg | Freq: Two times a day (BID) | SUBCUTANEOUS | Status: DC

## 2016-03-16 MED ORDER — SODIUM CHLORIDE 0.9 % IV SOLN
INTRAVENOUS | Status: DC
  Administered 2016-03-16: 100 mL/h via INTRAVENOUS
  Administered 2016-03-17: 03:00:00 via INTRAVENOUS

## 2016-03-16 MED ORDER — POTASSIUM CHLORIDE CRYS ER 20 MEQ PO TBCR
30.0000 meq | EXTENDED_RELEASE_TABLET | ORAL | Status: AC
  Administered 2016-03-16 (×2): 30 meq via ORAL
  Filled 2016-03-16 (×2): qty 1

## 2016-03-16 MED ORDER — ENOXAPARIN SODIUM 60 MG/0.6ML ~~LOC~~ SOLN: 60.0000 mg | SUBCUTANEOUS | Status: DC

## 2016-03-16 NOTE — Progress Notes (Signed)
PT Cancellation Note  Patient Details Name: [name] MRN: [id number] DOB: [birth year]   Cancelled Treatment:    Reason Eval/Treat Not Completed: PT screened, no needs identified, will sign off Pt from SNF and plans to return to SNF.  CSW reports no PT evaluation required to return to SNF.  Will defer PT needs to SNF.  Acute PT to sign off.   Demetrica Zipp,KATHrine E 03/16/2016, 11:19 AM Carmelia Bake, PT, DPT 03/16/2016 Pager: [phone]

## 2016-03-16 NOTE — Progress Notes (Signed)
CRITICAL VALUE ALERT  Critical value received:  Potassium 2.7  Date of notification:  03/16/16  Time of notification:  0615  Critical value read back:Yes.    Nurse who received alert:  Ellen Henri  MD notified (1st page):  NP on call  Time of first page:  [phone]  MD notified (2nd page):  Time of second page:  Responding MD:  NP oncall  Time MD responded:  [phone]

## 2016-03-16 NOTE — Progress Notes (Addendum)
PROGRESS NOTE    DANTON GAHAN  [id number] DOB: Aug 21, 1940 DOA: 03/14/2016 PCP: Elayne Snare, MD    Brief Narrative:  75 year old male with history of metastatic prostrate CA, diabetes type 2, insulin-dependent, hypertension, anemia, chronic kidney disease stage III, was referred to ED from skilled nursing facility for anemia, hemoglobin was 6.8 at the rehabilitation center. Patient was recently discharged from hospital on  02/29/16 after being treated for GI bleed. Prior to that patient was also admitted in November, 2017 and was found to have chronic DVT of right lower extremity and was placed on Lovenox. Lovenox was resumed at the time of discharge. In the ED, patient found to be anemic with heme pos stools. Patient was admitted for further work up.  Assessment & Plan:   Principal Problem:   Acute blood loss anemia Active Problems:   Diabetes mellitus type 2 in nonobese (HCC)   Hypokalemia   Achalasia   Heme positive stool   Symptomatic anemia   Principal Problem:   Acute blood loss anemia in the setting of heme positive stools - Presenting Hemoglobin noted to be 7.1, transfused 2 units of packed RBCs - GI was consulted, initial plans were for EGD and colonoscopy,  - procedures have been cancelled with recommendations for supportive care and transfusions as needed - OK for anticoagulation per GI. Have resumed therapeutic anticoagulation - Will repeat CBC in AM  Active Problems:   Diabetes mellitus type 2 in nonobese (HCC) - Continue on SSI  - Glucose trends unremarkable    Hypokalemia - Despite replacement, potassium remains low and below 3 - Replacement ordered - Repeat bmet ordered for this afternoon as well as mg level - Cont to replace as needed    Achalasia - Patient did complain of fullness with full liquid diet - Tolerating diet, however pt is not  Chronic DVT - Held anticoagulation on admit - would resume when OK with GI  Chronic kidney disease,  stage III Creatinine had improved since admission, currently below previously stated baseline of 2.6 - Will repeat bmet in AM  Hypernatremia -Na of 153 yesterday, remains 151 today -Given decreased PO intake, likely a result of dehydration - Have increased IVF from 75cc to 100cc/hr - Follow serial sodium trends  Vtach -New finding - Order ekg - 11 beats vtach reviewed on rhythm strip - Patient already on beta blocker - Suspicion for electrolyte abnormality - BMET and mg already ordered for now. Await results and correct lytes as needed  DVT prophylaxis: SCD's Code Status: Full Family Communication: Pt in room, family at bedside Disposition Plan: Possible d/c in 24hrs if stable  Consultants:   GI  Procedures:     Antimicrobials: Anti-infectives    None      Subjective: Wants to eat regular food  Objective: Vitals:   03/15/16 1156 03/15/16 2154 03/16/16 0532 03/16/16 1232  BP: 137/79 128/68 124/60 131/65  Pulse: 71 (!) 101 80 77  Resp: 18 18 18 18   Temp: 97.6 F (36.4 C) 97.5 F (36.4 C) 98.6 F (37 C) 98.3 F (36.8 C)  TempSrc: Oral Oral Oral Oral  SpO2: 100% 100% 99% 100%  Weight:      Height:        Intake/Output Summary (Last 24 hours) at 03/16/16 1651 Last data filed at 03/16/16 1400  Gross per 24 hour  Intake              450 ml  Output  0 ml  Net              450 ml   Filed Weights   03/14/16 1305 03/14/16 1749  Weight: 66.2 kg (146 lb) 64.4 kg (141 lb 15.6 oz)    Examination:  General exam: Laying in bed, in nad Respiratory system: normal resp effort, no wheezing Cardiovascular system: regular rate, s1, s2 Gastrointestinal system: soft, nondistended, pos BS Central nervous system: cn2-12 grossly intact, strength intact Extremities: no clubbing, no cyanosis Skin: Normal skin turgor, no abnormal skin lesions seen Psychiatry: Mood normal// no visual hallucinations   Data Reviewed: I have personally reviewed following  labs and imaging studies  CBC:  Recent Labs Lab 03/14/16 1324 03/15/16 0404 03/16/16 0512  WBC 7.6 6.0 6.6  HGB 7.1* 8.7* 10.5*  HCT 21.7* 26.6* 30.7*  MCV 86.1 88.1 85.8  PLT 244 227 [id number]   Basic Metabolic Panel:  Recent Labs Lab 03/14/16 1324 03/15/16 0404 03/16/16 0512  NA 148* 153* 151*  K 2.5* 2.6* 2.7*  CL 108 115* 115*  CO2 27 27 26   GLUCOSE 201* 107* 121*  BUN 22* 18 15  CREATININE 1.94* 1.79* 1.67*  CALCIUM 7.3* 7.0* 7.0*  MG  --  1.1* 1.9   GFR: Estimated Creatinine Clearance: 34.8 mL/min (by C-G formula based on SCr of 1.67 mg/dL (H)). Liver Function Tests: No results for input(s): AST, ALT, ALKPHOS, BILITOT, PROT, ALBUMIN in the last 168 hours. No results for input(s): LIPASE, AMYLASE in the last 168 hours. No results for input(s): AMMONIA in the last 168 hours. Coagulation Profile:  Recent Labs Lab 03/14/16 1324  INR 1.04   Cardiac Enzymes: No results for input(s): CKTOTAL, CKMB, CKMBINDEX, TROPONINI in the last 168 hours. BNP (last 3 results) No results for input(s): PROBNP in the last 8760 hours. HbA1C:  Recent Labs  03/14/16 1830  HGBA1C 6.1*   CBG:  Recent Labs Lab 03/15/16 1719 03/15/16 2151 03/16/16 0753 03/16/16 1144 03/16/16 1642  GLUCAP 154* 86 133* 155* 153*   Lipid Profile: No results for input(s): CHOL, HDL, LDLCALC, TRIG, CHOLHDL, LDLDIRECT in the last 72 hours. Thyroid Function Tests: No results for input(s): TSH, T4TOTAL, FREET4, T3FREE, THYROIDAB in the last 72 hours. Anemia Panel: No results for input(s): VITAMINB12, FOLATE, FERRITIN, TIBC, IRON, RETICCTPCT in the last 72 hours. Sepsis Labs: No results for input(s): PROCALCITON, LATICACIDVEN in the last 168 hours.  No results found for this or any previous visit (from the past 240 hour(s)).   Radiology Studies: No results found.  Scheduled Meds: . atenolol  25 mg Oral Daily  . atorvastatin  10 mg Oral QPC supper  . enoxaparin (LOVENOX) injection  60 mg  Subcutaneous Q24H  . feeding supplement (ENSURE ENLIVE)  237 mL Oral TID BM  . hydrocortisone  10 mg Oral BID  . insulin aspart  0-5 Units Subcutaneous QHS  . insulin aspart  0-9 Units Subcutaneous TID WC  . multivitamin with minerals  1 tablet Oral Daily  . pantoprazole  40 mg Oral BID  . sodium chloride flush  3 mL Intravenous Q12H   Continuous Infusions: . sodium chloride 100 mL/hr (03/16/16 1200)     LOS: 2 days   CHIU, Orpah Melter, MD [facility] Pager [phone]  If 7PM-7AM, please contact night-coverage [email or web address] Password [email or web address] 03/16/2016, 4:51 PM

## 2016-03-17 DIAGNOSIS — R6520 Severe sepsis without septic shock: Secondary | ICD-10-CM | POA: Diagnosis not present

## 2016-03-17 DIAGNOSIS — I10 Essential (primary) hypertension: Secondary | ICD-10-CM | POA: Diagnosis not present

## 2016-03-17 DIAGNOSIS — E784 Other hyperlipidemia: Secondary | ICD-10-CM | POA: Diagnosis not present

## 2016-03-17 DIAGNOSIS — E2740 Unspecified adrenocortical insufficiency: Secondary | ICD-10-CM | POA: Diagnosis not present

## 2016-03-17 DIAGNOSIS — M6281 Muscle weakness (generalized): Secondary | ICD-10-CM | POA: Diagnosis not present

## 2016-03-17 DIAGNOSIS — I82419 Acute embolism and thrombosis of unspecified femoral vein: Secondary | ICD-10-CM | POA: Diagnosis not present

## 2016-03-17 DIAGNOSIS — E860 Dehydration: Secondary | ICD-10-CM | POA: Diagnosis not present

## 2016-03-17 DIAGNOSIS — N179 Acute kidney failure, unspecified: Secondary | ICD-10-CM | POA: Diagnosis not present

## 2016-03-17 DIAGNOSIS — Z7901 Long term (current) use of anticoagulants: Secondary | ICD-10-CM | POA: Diagnosis not present

## 2016-03-17 DIAGNOSIS — L89322 Pressure ulcer of left buttock, stage 2: Secondary | ICD-10-CM | POA: Diagnosis not present

## 2016-03-17 DIAGNOSIS — R1314 Dysphagia, pharyngoesophageal phase: Secondary | ICD-10-CM | POA: Diagnosis not present

## 2016-03-17 DIAGNOSIS — E876 Hypokalemia: Secondary | ICD-10-CM | POA: Diagnosis not present

## 2016-03-17 DIAGNOSIS — D500 Iron deficiency anemia secondary to blood loss (chronic): Secondary | ICD-10-CM | POA: Diagnosis not present

## 2016-03-17 DIAGNOSIS — B3781 Candidal esophagitis: Secondary | ICD-10-CM | POA: Diagnosis not present

## 2016-03-17 DIAGNOSIS — K922 Gastrointestinal hemorrhage, unspecified: Secondary | ICD-10-CM | POA: Diagnosis not present

## 2016-03-17 DIAGNOSIS — J189 Pneumonia, unspecified organism: Secondary | ICD-10-CM | POA: Diagnosis not present

## 2016-03-17 DIAGNOSIS — E46 Unspecified protein-calorie malnutrition: Secondary | ICD-10-CM | POA: Diagnosis not present

## 2016-03-17 DIAGNOSIS — D649 Anemia, unspecified: Secondary | ICD-10-CM | POA: Diagnosis not present

## 2016-03-17 DIAGNOSIS — R2689 Other abnormalities of gait and mobility: Secondary | ICD-10-CM | POA: Diagnosis not present

## 2016-03-17 DIAGNOSIS — L309 Dermatitis, unspecified: Secondary | ICD-10-CM | POA: Diagnosis not present

## 2016-03-17 DIAGNOSIS — I82501 Chronic embolism and thrombosis of unspecified deep veins of right lower extremity: Secondary | ICD-10-CM | POA: Diagnosis not present

## 2016-03-17 DIAGNOSIS — K219 Gastro-esophageal reflux disease without esophagitis: Secondary | ICD-10-CM | POA: Diagnosis not present

## 2016-03-17 DIAGNOSIS — E119 Type 2 diabetes mellitus without complications: Secondary | ICD-10-CM | POA: Diagnosis not present

## 2016-03-17 DIAGNOSIS — E1122 Type 2 diabetes mellitus with diabetic chronic kidney disease: Secondary | ICD-10-CM | POA: Diagnosis not present

## 2016-03-17 DIAGNOSIS — D62 Acute posthemorrhagic anemia: Secondary | ICD-10-CM | POA: Diagnosis not present

## 2016-03-17 DIAGNOSIS — C61 Malignant neoplasm of prostate: Secondary | ICD-10-CM | POA: Diagnosis not present

## 2016-03-17 DIAGNOSIS — L299 Pruritus, unspecified: Secondary | ICD-10-CM | POA: Diagnosis not present

## 2016-03-17 LAB — MAGNESIUM: Magnesium: 1.7 mg/dL (ref 1.7–2.4)

## 2016-03-17 LAB — CBC
HCT: 31.6 % — ABNORMAL LOW (ref 39.0–52.0)
HEMOGLOBIN: 10.5 g/dL — AB (ref 13.0–17.0)
MCH: 28.8 pg (ref 26.0–34.0)
MCHC: 33.2 g/dL (ref 30.0–36.0)
MCV: 86.8 fL (ref 78.0–100.0)
PLATELETS: 236 10*3/uL (ref 150–400)
RBC: 3.64 MIL/uL — AB (ref 4.22–5.81)
RDW: 17.2 % — ABNORMAL HIGH (ref 11.5–15.5)
WBC: 6.2 10*3/uL (ref 4.0–10.5)

## 2016-03-17 LAB — BASIC METABOLIC PANEL
ANION GAP: 7 (ref 5–15)
BUN: 14 mg/dL (ref 6–20)
CO2: 26 mmol/L (ref 22–32)
Calcium: 6.8 mg/dL — ABNORMAL LOW (ref 8.9–10.3)
Chloride: 116 mmol/L — ABNORMAL HIGH (ref 101–111)
Creatinine, Ser: 1.52 mg/dL — ABNORMAL HIGH (ref 0.61–1.24)
GFR, EST AFRICAN AMERICAN: 50 mL/min — AB (ref >60)
GFR, EST NON AFRICAN AMERICAN: 43 mL/min — AB (ref >60)
GLUCOSE: 90 mg/dL (ref 65–99)
POTASSIUM: 3.5 mmol/L (ref 3.5–5.1)
Sodium: 149 mmol/L — ABNORMAL HIGH (ref 135–145)

## 2016-03-17 LAB — GLUCOSE, CAPILLARY
GLUCOSE-CAPILLARY: 171 mg/dL — AB (ref 65–99)
Glucose-Capillary: 87 mg/dL (ref 65–99)

## 2016-03-17 MED ORDER — METOCLOPRAMIDE HCL 5 MG/ML IJ SOLN
5.0000 mg | INTRAMUSCULAR | Status: AC
  Administered 2016-03-17: 5 mg via INTRAVENOUS
  Filled 2016-03-17: qty 2

## 2016-03-17 MED ORDER — MAGNESIUM SULFATE 2 GM/50ML IV SOLN
2.0000 g | Freq: Once | INTRAVENOUS | Status: AC
  Administered 2016-03-17: 2 g via INTRAVENOUS
  Filled 2016-03-17: qty 50

## 2016-03-17 MED ORDER — POTASSIUM CHLORIDE 20 MEQ/15ML (10%) PO SOLN
60.0000 meq | ORAL | Status: AC
  Administered 2016-03-17: 60 meq via ORAL
  Filled 2016-03-17: qty 45

## 2016-03-17 NOTE — Progress Notes (Signed)
Patient is set to discharge back to [facility] today. Patient, wife & son at bedside aware. Discharge packet given to RN, Stanton Kidney. [facility] called for transport.     Raynaldo Opitz, [facility] Clinical Social Worker cell #: [phone]

## 2016-03-17 NOTE — Discharge Summary (Signed)
Physician Discharge Summary  [name] [id number] DOB: 1940/07/21 DOA: 03/14/2016  PCP: Elayne Snare, MD  Admit date: 03/14/2016 Discharge date: 03/17/2016  Admitted From: SNF Disposition:  SNF  Recommendations for Outpatient Follow-up:  1. Follow up with PCP in 1-2 weeks 2. Please obtain basic metabolic panel and magnesium level in 1 week  Discharge Condition:Improved CODE STATUS:Full Diet recommendation: full liquid   Brief/Interim Summary: 75 year old male with history of metastatic prostrate CA, diabetes type 2, insulin-dependent, hypertension, anemia, chronic kidney disease stage III, was referred to ED from skilled nursing facility for anemia, hemoglobin was 6.8 at the rehabilitation center. Patient was recently discharged from hospital on 02/29/16 after being treated for GI bleed. Prior to that patient was also admitted in November, 2017 and was found to have chronic DVT of right lower extremity and was placed on Lovenox. Lovenox was resumed at the time of discharge. In the ED, patient found to be anemic with heme pos stools. Patient was admitted for further work up.  Principal Problem: Acute blood loss anemiain the setting of heme positive stools - Presenting Hemoglobin noted to be 7.1, transfused 2 units of packed RBCs - GI was consulted, initial plans were for EGD and colonoscopy,  - procedures have been cancelled with recommendations for supportive care and transfusions as needed - OK for anticoagulation per GI. Have resumed therapeutic anticoagulation - recommend repeat CBC in 1 week  Active Problems: Diabetes mellitus type 2 in nonobese (HCC) - Continue on SSI  - Glucose trends unremarkable  Hypokalemia - Replaced - recommend repeat basic metabolic panel in 1 week - recommend checking magnesium level in 1 week  Achalasia - Patient did complain of fullness with full liquid diet - Tolerating diet  Chronic DVT - Held anticoagulation on  admit - resumed  Chronic kidney disease, stage III Creatinine had improved since admission, currently below previously stated baseline of 2.6 - Recommend repeat bmet in 1 week  Hypernatremia -Na peaked to 153, improved with hydration -Given decreased PO intake, likely a result of dehydration  Vtach - 11 beats vtach reviewed on rhythm strip - Patient already on beta blocker - would continue - Suspicion for electrolyte abnormality - Electrolytes were corrected - Remained stable overnight  Discharge Diagnoses:  Principal Problem:   Acute blood loss anemia Active Problems:   Diabetes mellitus type 2 in nonobese (HCC)   Hypokalemia   Achalasia   Heme positive stool   Symptomatic anemia    Discharge Instructions     Medication List    STOP taking these medications   ferrous sulfate 325 (65 FE) MG tablet   furosemide 40 MG tablet Commonly known as:  LASIX   potassium chloride SA 20 MEQ tablet Commonly known as:  KLOR-CON M20     TAKE these medications   acetaminophen 650 MG CR tablet Commonly known as:  TYLENOL Take 650 mg by mouth every 4 (four) hours as needed for pain.   atenolol 25 MG tablet Commonly known as:  TENORMIN Take 25 mg by mouth daily.   atorvastatin 10 MG tablet Commonly known as:  LIPITOR Take 1 tablet (10 mg total) by mouth daily after supper.   docusate sodium 100 MG capsule Commonly known as:  COLACE Take 1 capsule (100 mg total) by mouth 2 (two) times daily.   enoxaparin 60 MG/0.6ML injection Commonly known as:  LOVENOX Inject 0.6 mLs (60 mg total) into the skin daily.   feeding supplement (GLUCERNA SHAKE) Liqd Take 237 mLs by mouth  2 (two) times daily between meals.   glipiZIDE 2.5 MG 24 hr tablet Commonly known as:  GLUCOTROL XL Take 2.5 mg by mouth daily with breakfast.   hydrocortisone 10 MG tablet Commonly known as:  CORTEF Take 1 tablet (10 mg total) by mouth 2 (two) times daily.   multivitamin with minerals Tabs  tablet Take 1 tablet by mouth daily.   ONE TOUCH ULTRA TEST test strip Generic drug:  glucose blood USE TO TEST ONCE DAILY   ONETOUCH DELICA LANCETS 99991111 Misc USE TO TEST ONCE DAILY Dx code E11.65   pantoprazole 40 MG tablet Commonly known as:  PROTONIX Take 1 tablet (40 mg total) by mouth 2 (two) times daily.   pioglitazone 30 MG tablet Commonly known as:  ACTOS TAKE 1 TABLET BY MOUTH EVERY DAY What changed:  See the new instructions.   polyethylene glycol packet Commonly known as:  MIRALAX / GLYCOLAX Take 17 g by mouth daily.   TRADJENTA 5 MG Tabs tablet Generic drug:  linagliptin TAKE 1 TABLET (5 MG TOTAL) BY MOUTH DAILY.   ZYTIGA 250 MG tablet Generic drug:  abiraterone Acetate TAKE 4 TABLETS (1,000MG ) BY MOUTH EVERY DAY ON AN EMPTY STOMACH 1 HOURBEFORE OR 2 HOURS AFTER A MEAL What changed:  See the new instructions.      Follow-up Information    KUMAR,AJAY, MD. Schedule an appointment as soon as possible for a visit in 1 week(s).   Specialty:  Endocrinology Contact information: [place] [address] [phone]          No Known Allergies  Consultations:  GI  Procedures/Studies: Dg Chest 1 View  Result Date: 02/21/2016 CLINICAL DATA:  Hypotension. EXAM: CHEST 1 VIEW COMPARISON:  02/21/2016 FINDINGS: The heart is borderline enlarged. There is tortuosity of the thoracic aorta. Increased interstitial markings, peribronchial thickening, bilateral pleural effusions and bibasilar atelectasis suggesting CHF. The bony thorax is intact. IMPRESSION: CHF with bilateral effusions and bibasilar atelectasis. Electronically Signed   By: Marijo Sanes M.D.   On: 02/21/2016 16:51   Dg Chest Port 1 View  Result Date: 02/22/2016 CLINICAL DATA:  CHF, sepsis EXAM: PORTABLE CHEST 1 VIEW COMPARISON:  02/21/2016 FINDINGS: There is mild bilateral interstitial thickening. There is no focal parenchymal opacity. There is no pleural effusion or  pneumothorax. The heart and mediastinal contours are unremarkable. The osseous structures are unremarkable. IMPRESSION: Findings most consistent with mild interstitial edema. Electronically Signed   By: Kathreen Devoid   On: 02/22/2016 16:21   Dg Chest Port 1 View  Result Date: 02/21/2016 CLINICAL DATA:  Fever, nausea, and vomiting. EXAM: PORTABLE CHEST 1 VIEW COMPARISON:  Portable chest x-ray of February 02, 2016 FINDINGS: The lungs are adequately inflated. There is patchy increased density at the left lung base. The heart border remain sharp. A trace of pleural fluid blunts the left costophrenic angle. The heart and pulmonary vascularity is normal. There is prominence of the aortic arch and proximal descending thoracic aorta which is stable. The mediastinum is normal in width. The bony thorax exhibits no acute abnormality. IMPRESSION: Slight interval increase in left lower lobe atelectasis or pneumonia. Reportedly there is a large hiatal hernia and a portion of the density may be related to this. No CHF. Trace left pleural effusion. Electronically Signed   By: David  Martinique M.D.   On: 02/21/2016 07:20    Subjective: No complaints  Discharge Exam: Vitals:   03/16/16 2144 03/17/16 0459  BP: (!) 148/74 135/82  Pulse: 75 71  Resp: 18 16  Temp: 98.2 F (36.8 C) 98.9 F (37.2 C)   Vitals:   03/16/16 0532 03/16/16 1232 03/16/16 2144 03/17/16 0459  BP: 124/60 131/65 (!) 148/74 135/82  Pulse: 80 77 75 71  Resp: 18 18 18 16   Temp: 98.6 F (37 C) 98.3 F (36.8 C) 98.2 F (36.8 C) 98.9 F (37.2 C)  TempSrc: Oral Oral Oral Oral  SpO2: 99% 100% 100% 100%  Weight:      Height:        General: Pt is alert, awake, not in acute distress Cardiovascular: RRR, S1/S2 +, no rubs, no gallops Respiratory: CTA bilaterally, no wheezing, no rhonchi Abdominal: Soft, NT, ND, bowel sounds + Extremities: no edema, no cyanosis   The results of significant diagnostics from this hospitalization (including  imaging, microbiology, ancillary and laboratory) are listed below for reference.     Microbiology: No results found for this or any previous visit (from the past 240 hour(s)).   Labs: BNP (last 3 results) No results for input(s): BNP in the last 8760 hours. Basic Metabolic Panel:  Recent Labs Lab 03/14/16 1324 03/15/16 0404 03/16/16 0512 03/16/16 1635 03/17/16 0521  NA 148* 153* 151* 149* 149*  K 2.5* 2.6* 2.7* 3.5 3.5  CL 108 115* 115* 116* 116*  CO2 27 27 26 26 26   GLUCOSE 201* 107* 121* 161* 90  BUN 22* 18 15 14 14   CREATININE 1.94* 1.79* 1.67* 1.63* 1.52*  CALCIUM 7.3* 7.0* 7.0* 7.1* 6.8*  MG  --  1.1* 1.9 1.9 1.7   Liver Function Tests: No results for input(s): AST, ALT, ALKPHOS, BILITOT, PROT, ALBUMIN in the last 168 hours. No results for input(s): LIPASE, AMYLASE in the last 168 hours. No results for input(s): AMMONIA in the last 168 hours. CBC:  Recent Labs Lab 03/14/16 1324 03/15/16 0404 03/16/16 0512 03/17/16 0521  WBC 7.6 6.0 6.6 6.2  HGB 7.1* 8.7* 10.5* 10.5*  HCT 21.7* 26.6* 30.7* 31.6*  MCV 86.1 88.1 85.8 86.8  PLT 244 227 231 236   Cardiac Enzymes: No results for input(s): CKTOTAL, CKMB, CKMBINDEX, TROPONINI in the last 168 hours. BNP: Invalid input(s): POCBNP CBG:  Recent Labs Lab 03/16/16 1144 03/16/16 1642 03/16/16 2140 03/17/16 0736 03/17/16 1240  GLUCAP 155* 153* 215* 87 171*   D-Dimer No results for input(s): DDIMER in the last 72 hours. Hgb A1c  Recent Labs  03/14/16 1830  HGBA1C 6.1*   Lipid Profile No results for input(s): CHOL, HDL, LDLCALC, TRIG, CHOLHDL, LDLDIRECT in the last 72 hours. Thyroid function studies No results for input(s): TSH, T4TOTAL, T3FREE, THYROIDAB in the last 72 hours.  Invalid input(s): FREET3 Anemia work up No results for input(s): VITAMINB12, FOLATE, FERRITIN, TIBC, IRON, RETICCTPCT in the last 72 hours. Urinalysis    Component Value Date/Time   COLORURINE YELLOW 03/14/2016 1502    APPEARANCEUR CLEAR 03/14/2016 1502   LABSPEC 1.009 03/14/2016 1502   PHURINE 5.0 03/14/2016 1502   GLUCOSEU NEGATIVE 03/14/2016 1502   GLUCOSEU NEGATIVE 09/22/2014 0757   HGBUR NEGATIVE 03/14/2016 1502   BILIRUBINUR NEGATIVE 03/14/2016 1502   BILIRUBINUR n 04/30/2011 1534   KETONESUR NEGATIVE 03/14/2016 1502   PROTEINUR NEGATIVE 03/14/2016 1502   UROBILINOGEN 0.2 09/22/2014 0757   NITRITE NEGATIVE 03/14/2016 1502   LEUKOCYTESUR NEGATIVE 03/14/2016 1502   Sepsis Labs Invalid input(s): PROCALCITONIN,  WBC,  LACTICIDVEN Microbiology No results found for this or any previous visit (from the past 240 hour(s)).   SIGNED:  CHIU, Orpah Melter, MD  [facility] 03/17/2016, 2:06 PM  If 7PM-7AM, please contact night-coverage [email or web address] Password [email or web address]

## 2016-03-17 NOTE — Plan of Care (Signed)
  Problem: Nutrition: Goal: Adequate nutrition will be maintained Outcome: Progressing   

## 2016-03-17 NOTE — Progress Notes (Signed)
Assumed care of patient at . Agree with previous Nurse assessment.  Montae Stager M. Rollins Wrightson, RN  

## 2016-03-17 NOTE — Progress Notes (Signed)
Pharmacy - Lovenox - FYI  Assessment:  58 yoM with Hx PE in 2012, treated with warfarin for 12 months.   Recently admitted 01/28/16 for weakness and falls. Workup at that time revealed chronic RLE DVT of indeterminate age and patient started back on anticoagulation, this time with Lovenox given prostate cancer.  Since restarting Lovenox, patient has had several episodes of anemia with positive FOBT, including this admission. Per GI ok to resume Lovenox, and no interventions planned but supporting Hgb with transfusions as needed. I spoke with Dr. Alen Blew about Lovenox dosing, and given borderline CrCl (~35 ml/min at the time), bleeding history, and lack of any planned GI interventions, he was in agreement with using the reduced dose intended for CrCl < 30 ml/min.  Plan:  Resumed Lovenox 60 mg SQ daily  If CrCl significantly improves, could consider increasing the dose back to q12 hr (keeping in mind bleeding risk, and that patient does not want invasive GI workup)  Reuel Boom, PharmD, BCPS Pager: [phone] 03/17/2016, 2:39 PM

## 2016-03-19 ENCOUNTER — Other Ambulatory Visit: Payer: Self-pay | Admitting: Oncology

## 2016-03-19 DIAGNOSIS — Z8546 Personal history of malignant neoplasm of prostate: Secondary | ICD-10-CM

## 2016-03-20 DIAGNOSIS — L299 Pruritus, unspecified: Secondary | ICD-10-CM | POA: Diagnosis not present

## 2016-03-20 DIAGNOSIS — L309 Dermatitis, unspecified: Secondary | ICD-10-CM | POA: Diagnosis not present

## 2016-03-20 NOTE — Progress Notes (Signed)
[name] scheduled to see Dr. Alen Blew but admitted to hospital. He was taking Zytiga but this has been place on hold until he is evaluated for other pressing symptoms

## 2016-03-22 ENCOUNTER — Other Ambulatory Visit: Payer: Self-pay | Admitting: *Deleted

## 2016-03-22 DIAGNOSIS — I82419 Acute embolism and thrombosis of unspecified femoral vein: Secondary | ICD-10-CM | POA: Diagnosis not present

## 2016-03-22 DIAGNOSIS — L89322 Pressure ulcer of left buttock, stage 2: Secondary | ICD-10-CM | POA: Diagnosis not present

## 2016-03-22 DIAGNOSIS — D500 Iron deficiency anemia secondary to blood loss (chronic): Secondary | ICD-10-CM | POA: Diagnosis not present

## 2016-03-22 DIAGNOSIS — E1122 Type 2 diabetes mellitus with diabetic chronic kidney disease: Secondary | ICD-10-CM | POA: Diagnosis not present

## 2016-03-22 DIAGNOSIS — I10 Essential (primary) hypertension: Secondary | ICD-10-CM | POA: Diagnosis not present

## 2016-03-23 ENCOUNTER — Other Ambulatory Visit: Payer: Self-pay | Admitting: *Deleted

## 2016-03-24 NOTE — Patient Outreach (Signed)
[facility]) Care Management  03/24/2016  [name] 04-Nov-1940 [id number]  CSW met patient on 03/23/16 at [facility] where is residing for rehab. CSW explained role and [facility] CM program to patient as well as provided him with a [facility] CM Packet to review. "My sons have to be here to make any decisions". CSW offered to leave the packet and plan follow up call to patient's son to also explain the [facility] CM program.  Will plan f/u call to family to determine if they agree to participating.    Eduard Clos, MSW, [place] Worker  [place] [phone]

## 2016-03-26 ENCOUNTER — Other Ambulatory Visit: Payer: Self-pay | Admitting: *Deleted

## 2016-03-27 NOTE — Patient Outreach (Signed)
[facility] [facility]) Care Management  03/27/2016  ZAYID KROEKER 06-08-1940 [id number]   CSW spoke with patients son by phone- introduced self and role of [facility] program. Advised him of my visit with his father last week and of [facility] packet left at his bedside at [facility].  CSW asked son to look over packet and assist his dad (per patients request) with considering our services and program.  CSW will plan f/u call to son later this week for decision on interest and will proceed accordingly.   Eduard Clos, MSW, [place] Worker  [place] [phone]

## 2016-03-29 ENCOUNTER — Ambulatory Visit: Payer: Self-pay | Admitting: *Deleted

## 2016-04-03 ENCOUNTER — Telehealth: Payer: Self-pay

## 2016-04-03 NOTE — Telephone Encounter (Signed)
Melissa with [facility] called requesting a verbal start of care order for this patient. Currently the patient is listed as a PCP patient for Dr. Dwyane Dee. Please advise during his absence if the start of care order is ok. Thanks!

## 2016-04-03 NOTE — Telephone Encounter (Signed)
Jenny Reichmann with [facility] called to inform the patient will begin receiving home health services on 04/04/2016.

## 2016-04-03 NOTE — Telephone Encounter (Signed)
I contacted Melissa and advised of verbal ok per Dr. Cruzita Lederer.

## 2016-04-03 NOTE — Telephone Encounter (Signed)
OK 

## 2016-04-04 ENCOUNTER — Telehealth: Payer: Self-pay | Admitting: Endocrinology

## 2016-04-04 DIAGNOSIS — K210 Gastro-esophageal reflux disease with esophagitis: Secondary | ICD-10-CM | POA: Diagnosis not present

## 2016-04-04 DIAGNOSIS — E119 Type 2 diabetes mellitus without complications: Secondary | ICD-10-CM | POA: Diagnosis not present

## 2016-04-04 DIAGNOSIS — D649 Anemia, unspecified: Secondary | ICD-10-CM | POA: Diagnosis not present

## 2016-04-04 DIAGNOSIS — K220 Achalasia of cardia: Secondary | ICD-10-CM | POA: Diagnosis not present

## 2016-04-04 DIAGNOSIS — Z8701 Personal history of pneumonia (recurrent): Secondary | ICD-10-CM | POA: Diagnosis not present

## 2016-04-04 DIAGNOSIS — R1314 Dysphagia, pharyngoesophageal phase: Secondary | ICD-10-CM | POA: Diagnosis not present

## 2016-04-04 NOTE — Telephone Encounter (Signed)
Melissa with [facility] called and said that she needed to speak with someone regarding Pt's care and treatment plan.  Her call back number is [phone]

## 2016-04-05 ENCOUNTER — Other Ambulatory Visit: Payer: Self-pay

## 2016-04-05 DIAGNOSIS — E119 Type 2 diabetes mellitus without complications: Secondary | ICD-10-CM | POA: Diagnosis not present

## 2016-04-05 DIAGNOSIS — D649 Anemia, unspecified: Secondary | ICD-10-CM | POA: Diagnosis not present

## 2016-04-05 DIAGNOSIS — K220 Achalasia of cardia: Secondary | ICD-10-CM | POA: Diagnosis not present

## 2016-04-05 DIAGNOSIS — K210 Gastro-esophageal reflux disease with esophagitis: Secondary | ICD-10-CM | POA: Diagnosis not present

## 2016-04-05 DIAGNOSIS — Z8701 Personal history of pneumonia (recurrent): Secondary | ICD-10-CM | POA: Diagnosis not present

## 2016-04-05 DIAGNOSIS — R1314 Dysphagia, pharyngoesophageal phase: Secondary | ICD-10-CM | POA: Diagnosis not present

## 2016-04-05 NOTE — Telephone Encounter (Signed)
Pharmacy calling to verify liquid diet, also patient is not taking the Lovenox, wife do not know how to give injection.  please call at this new number [phone] ask for Tamsen Meek

## 2016-04-06 NOTE — Telephone Encounter (Signed)
Dr. Marylu Lund, hospitalist, rx the lovenox, [facility] is asking when or if the pt should be stopped or switched from taking this?

## 2016-04-10 ENCOUNTER — Telehealth: Payer: Self-pay | Admitting: Endocrinology

## 2016-04-10 ENCOUNTER — Other Ambulatory Visit (INDEPENDENT_AMBULATORY_CARE_PROVIDER_SITE_OTHER): Payer: Medicare Other

## 2016-04-10 DIAGNOSIS — K220 Achalasia of cardia: Secondary | ICD-10-CM | POA: Diagnosis not present

## 2016-04-10 DIAGNOSIS — K210 Gastro-esophageal reflux disease with esophagitis: Secondary | ICD-10-CM | POA: Diagnosis not present

## 2016-04-10 DIAGNOSIS — E1165 Type 2 diabetes mellitus with hyperglycemia: Secondary | ICD-10-CM | POA: Diagnosis not present

## 2016-04-10 DIAGNOSIS — E119 Type 2 diabetes mellitus without complications: Secondary | ICD-10-CM | POA: Diagnosis not present

## 2016-04-10 DIAGNOSIS — R1314 Dysphagia, pharyngoesophageal phase: Secondary | ICD-10-CM | POA: Diagnosis not present

## 2016-04-10 DIAGNOSIS — Z8701 Personal history of pneumonia (recurrent): Secondary | ICD-10-CM | POA: Diagnosis not present

## 2016-04-10 DIAGNOSIS — D649 Anemia, unspecified: Secondary | ICD-10-CM | POA: Diagnosis not present

## 2016-04-10 LAB — HEMOGLOBIN A1C: HEMOGLOBIN A1C: 5.9 % (ref 4.6–6.5)

## 2016-04-10 LAB — COMPREHENSIVE METABOLIC PANEL
ALT: 10 U/L (ref 0–53)
AST: 19 U/L (ref 0–37)
Albumin: 2.1 g/dL — ABNORMAL LOW (ref 3.5–5.2)
Alkaline Phosphatase: 105 U/L (ref 39–117)
BUN: 15 mg/dL (ref 6–23)
CHLORIDE: 106 meq/L (ref 96–112)
CO2: 30 meq/L (ref 19–32)
CREATININE: 2.17 mg/dL — AB (ref 0.40–1.50)
Calcium: 7.6 mg/dL — ABNORMAL LOW (ref 8.4–10.5)
GFR: 38.31 mL/min — ABNORMAL LOW (ref >60.00)
Glucose, Bld: 128 mg/dL — ABNORMAL HIGH (ref 70–99)
Potassium: 3.5 mEq/L (ref 3.5–5.1)
SODIUM: 144 meq/L (ref 135–145)
Total Bilirubin: 0.5 mg/dL (ref 0.2–1.2)
Total Protein: 4.6 g/dL — ABNORMAL LOW (ref 6.0–8.3)

## 2016-04-10 NOTE — Telephone Encounter (Signed)
Need to ask PCP

## 2016-04-10 NOTE — Telephone Encounter (Signed)
I contacted [name] with [facility] via voicemail and advised Dr. Dwyane Dee was listed on 04/03/2016 as the patient's PCP in error. I advised the patient use to see Dutch Quint NP at [facility] and he would need to establish care with a new PCP. I advised Dr. Dwyane Dee would not be able to provide verbal orders further for this patient.

## 2016-04-10 NOTE — Telephone Encounter (Signed)
[name] called stated they need a Verbal order for continuation of therapy 2 x a week for 5 weeks.

## 2016-04-10 NOTE — Telephone Encounter (Signed)
See message and please advise, Thanks!  

## 2016-04-10 NOTE — Telephone Encounter (Signed)
Denied. Dr. Dwyane Dee cannot give this he is not PCP.

## 2016-04-11 ENCOUNTER — Encounter (HOSPITAL_COMMUNITY): Payer: Self-pay | Admitting: Emergency Medicine

## 2016-04-11 ENCOUNTER — Inpatient Hospital Stay (HOSPITAL_COMMUNITY)
Admission: EM | Admit: 2016-04-11 | Discharge: 2016-04-13 | DRG: 641 | Disposition: A | Discharge status: Home / Self Care | Payer: Medicare Other | Attending: Internal Medicine | Admitting: Internal Medicine

## 2016-04-11 DIAGNOSIS — K209 Esophagitis, unspecified: Secondary | ICD-10-CM | POA: Diagnosis present

## 2016-04-11 DIAGNOSIS — E860 Dehydration: Secondary | ICD-10-CM | POA: Diagnosis not present

## 2016-04-11 DIAGNOSIS — Z9119 Patient's noncompliance with other medical treatment and regimen: Secondary | ICD-10-CM

## 2016-04-11 DIAGNOSIS — E46 Unspecified protein-calorie malnutrition: Secondary | ICD-10-CM | POA: Diagnosis present

## 2016-04-11 DIAGNOSIS — E161 Other hypoglycemia: Secondary | ICD-10-CM | POA: Diagnosis not present

## 2016-04-11 DIAGNOSIS — Z8249 Family history of ischemic heart disease and other diseases of the circulatory system: Secondary | ICD-10-CM

## 2016-04-11 DIAGNOSIS — I82509 Chronic embolism and thrombosis of unspecified deep veins of unspecified lower extremity: Secondary | ICD-10-CM | POA: Diagnosis not present

## 2016-04-11 DIAGNOSIS — K220 Achalasia of cardia: Secondary | ICD-10-CM | POA: Diagnosis not present

## 2016-04-11 DIAGNOSIS — Z8546 Personal history of malignant neoplasm of prostate: Secondary | ICD-10-CM

## 2016-04-11 DIAGNOSIS — I10 Essential (primary) hypertension: Secondary | ICD-10-CM | POA: Diagnosis present

## 2016-04-11 DIAGNOSIS — E11649 Type 2 diabetes mellitus with hypoglycemia without coma: Secondary | ICD-10-CM | POA: Diagnosis present

## 2016-04-11 DIAGNOSIS — Z7984 Long term (current) use of oral hypoglycemic drugs: Secondary | ICD-10-CM

## 2016-04-11 DIAGNOSIS — E162 Hypoglycemia, unspecified: Secondary | ICD-10-CM | POA: Diagnosis present

## 2016-04-11 DIAGNOSIS — Z6821 Body mass index (BMI) 21.0-21.9, adult: Secondary | ICD-10-CM

## 2016-04-11 DIAGNOSIS — I129 Hypertensive chronic kidney disease with stage 1 through stage 4 chronic kidney disease, or unspecified chronic kidney disease: Secondary | ICD-10-CM | POA: Diagnosis present

## 2016-04-11 DIAGNOSIS — E440 Moderate protein-calorie malnutrition: Secondary | ICD-10-CM

## 2016-04-11 DIAGNOSIS — K222 Esophageal obstruction: Secondary | ICD-10-CM | POA: Diagnosis present

## 2016-04-11 DIAGNOSIS — C61 Malignant neoplasm of prostate: Secondary | ICD-10-CM | POA: Diagnosis not present

## 2016-04-11 DIAGNOSIS — N183 Chronic kidney disease, stage 3 unspecified: Secondary | ICD-10-CM | POA: Diagnosis present

## 2016-04-11 DIAGNOSIS — I951 Orthostatic hypotension: Secondary | ICD-10-CM | POA: Diagnosis not present

## 2016-04-11 DIAGNOSIS — E1122 Type 2 diabetes mellitus with diabetic chronic kidney disease: Secondary | ICD-10-CM | POA: Diagnosis present

## 2016-04-11 DIAGNOSIS — R031 Nonspecific low blood-pressure reading: Secondary | ICD-10-CM | POA: Diagnosis not present

## 2016-04-11 DIAGNOSIS — D649 Anemia, unspecified: Secondary | ICD-10-CM | POA: Diagnosis present

## 2016-04-11 DIAGNOSIS — N179 Acute kidney failure, unspecified: Secondary | ICD-10-CM | POA: Diagnosis present

## 2016-04-11 DIAGNOSIS — R55 Syncope and collapse: Secondary | ICD-10-CM | POA: Diagnosis not present

## 2016-04-11 DIAGNOSIS — Z87891 Personal history of nicotine dependence: Secondary | ICD-10-CM

## 2016-04-11 DIAGNOSIS — E876 Hypokalemia: Secondary | ICD-10-CM | POA: Diagnosis present

## 2016-04-11 DIAGNOSIS — Z7901 Long term (current) use of anticoagulants: Secondary | ICD-10-CM

## 2016-04-11 DIAGNOSIS — E1129 Type 2 diabetes mellitus with other diabetic kidney complication: Secondary | ICD-10-CM

## 2016-04-11 DIAGNOSIS — E785 Hyperlipidemia, unspecified: Secondary | ICD-10-CM | POA: Diagnosis present

## 2016-04-11 DIAGNOSIS — Z833 Family history of diabetes mellitus: Secondary | ICD-10-CM

## 2016-04-11 LAB — CBG MONITORING, ED
Glucose-Capillary: 59 mg/dL — ABNORMAL LOW (ref 65–99)
Glucose-Capillary: 120 mg/dL — ABNORMAL HIGH (ref 65–99)
Glucose-Capillary: 151 mg/dL — ABNORMAL HIGH (ref 65–99)

## 2016-04-11 LAB — BASIC METABOLIC PANEL
ANION GAP: 8 (ref 5–15)
BUN: 12 mg/dL (ref 6–20)
CHLORIDE: 106 mmol/L (ref 101–111)
CO2: 28 mmol/L (ref 22–32)
Calcium: 7.8 mg/dL — ABNORMAL LOW (ref 8.9–10.3)
Creatinine, Ser: 2.11 mg/dL — ABNORMAL HIGH (ref 0.61–1.24)
GFR calc non Af Amer: 29 mL/min — ABNORMAL LOW (ref >60)
GFR, EST AFRICAN AMERICAN: 34 mL/min — AB (ref >60)
GLUCOSE: 82 mg/dL (ref 65–99)
POTASSIUM: 2.9 mmol/L — AB (ref 3.5–5.1)
Sodium: 142 mmol/L (ref 135–145)

## 2016-04-11 LAB — CBC WITH DIFFERENTIAL/PLATELET
BASOS ABS: 0 10*3/uL (ref 0.0–0.1)
BASOS PCT: 0 %
Eosinophils Absolute: 0 10*3/uL (ref 0.0–0.7)
Eosinophils Relative: 0 %
HEMATOCRIT: 31.3 % — AB (ref 39.0–52.0)
HEMOGLOBIN: 10.4 g/dL — AB (ref 13.0–17.0)
LYMPHS PCT: 10 %
Lymphs Abs: 1.2 10*3/uL (ref 0.7–4.0)
MCH: 29 pg (ref 26.0–34.0)
MCHC: 33.2 g/dL (ref 30.0–36.0)
MCV: 87.2 fL (ref 78.0–100.0)
Monocytes Absolute: 0.5 10*3/uL (ref 0.1–1.0)
Monocytes Relative: 4 %
NEUTROS ABS: 10 10*3/uL — AB (ref 1.7–7.7)
NEUTROS PCT: 86 %
Platelets: 297 10*3/uL (ref 150–400)
RBC: 3.59 MIL/uL — AB (ref 4.22–5.81)
RDW: 16.8 % — ABNORMAL HIGH (ref 11.5–15.5)
WBC: 11.8 10*3/uL — ABNORMAL HIGH (ref 4.0–10.5)

## 2016-04-11 LAB — MAGNESIUM: MAGNESIUM: 1.3 mg/dL — AB (ref 1.7–2.4)

## 2016-04-11 LAB — GLUCOSE, CAPILLARY: Glucose-Capillary: 111 mg/dL — ABNORMAL HIGH (ref 65–99)

## 2016-04-11 MED ORDER — PANTOPRAZOLE SODIUM 40 MG PO TBEC
40.0000 mg | DELAYED_RELEASE_TABLET | Freq: Two times a day (BID) | ORAL | Status: DC
  Administered 2016-04-11 – 2016-04-13 (×5): 40 mg via ORAL
  Filled 2016-04-11 (×5): qty 1

## 2016-04-11 MED ORDER — POTASSIUM CHLORIDE 20 MEQ/15ML (10%) PO SOLN
20.0000 meq | Freq: Three times a day (TID) | ORAL | Status: DC
  Administered 2016-04-11 – 2016-04-12 (×3): 20 meq via ORAL
  Filled 2016-04-11 (×3): qty 15

## 2016-04-11 MED ORDER — ATORVASTATIN CALCIUM 10 MG PO TABS
10.0000 mg | ORAL_TABLET | Freq: Every day | ORAL | Status: DC
  Administered 2016-04-11 – 2016-04-12 (×2): 10 mg via ORAL
  Filled 2016-04-11 (×2): qty 1

## 2016-04-11 MED ORDER — POTASSIUM CHLORIDE IN NACL 20-0.9 MEQ/L-% IV SOLN
INTRAVENOUS | Status: DC
  Administered 2016-04-11 – 2016-04-12 (×2): via INTRAVENOUS
  Filled 2016-04-11 (×3): qty 1000

## 2016-04-11 MED ORDER — SODIUM CHLORIDE 0.9 % IV BOLUS (SEPSIS)
1000.0000 mL | Freq: Once | INTRAVENOUS | Status: AC
  Administered 2016-04-11: 1000 mL via INTRAVENOUS

## 2016-04-11 MED ORDER — ENOXAPARIN SODIUM 60 MG/0.6ML ~~LOC~~ SOLN
60.0000 mg | SUBCUTANEOUS | Status: DC
  Administered 2016-04-11 (×2): 60 mg via SUBCUTANEOUS
  Filled 2016-04-11 (×2): qty 0.6

## 2016-04-11 MED ORDER — ATENOLOL 25 MG PO TABS
25.0000 mg | ORAL_TABLET | Freq: Every day | ORAL | Status: DC
  Administered 2016-04-11 – 2016-04-13 (×3): 25 mg via ORAL
  Filled 2016-04-11 (×3): qty 1

## 2016-04-11 MED ORDER — ACETAMINOPHEN 325 MG PO TABS
650.0000 mg | ORAL_TABLET | Freq: Three times a day (TID) | ORAL | Status: DC
Start: 2016-04-11 — End: 2016-04-13
  Administered 2016-04-11 – 2016-04-12 (×2): 650 mg via ORAL
  Filled 2016-04-11 (×2): qty 2

## 2016-04-11 MED ORDER — FERROUS SULFATE 325 (65 FE) MG PO TABS
325.0000 mg | ORAL_TABLET | Freq: Every day | ORAL | Status: DC
  Administered 2016-04-12 – 2016-04-13 (×2): 325 mg via ORAL
  Filled 2016-04-11 (×2): qty 1

## 2016-04-11 MED ORDER — DOCUSATE SODIUM 100 MG PO CAPS
100.0000 mg | ORAL_CAPSULE | Freq: Two times a day (BID) | ORAL | Status: DC
  Administered 2016-04-11 – 2016-04-13 (×4): 100 mg via ORAL
  Filled 2016-04-11 (×4): qty 1

## 2016-04-11 MED ORDER — POTASSIUM CHLORIDE CRYS ER 20 MEQ PO TBCR: 40.0000 meq | EXTENDED_RELEASE_TABLET | Freq: Two times a day (BID) | ORAL | Status: DC

## 2016-04-11 MED ORDER — POLYETHYLENE GLYCOL 3350 17 G PO PACK
17.0000 g | PACK | ORAL | Status: DC
  Administered 2016-04-13: 17 g via ORAL
  Filled 2016-04-11: qty 1

## 2016-04-11 MED ORDER — ABIRATERONE ACETATE 250 MG PO TABS
1000.0000 mg | ORAL_TABLET | Freq: Every day | ORAL | Status: DC
  Administered 2016-04-12 – 2016-04-13 (×2): 1000 mg via ORAL
  Filled 2016-04-11 (×2): qty 4

## 2016-04-11 MED ORDER — DEXTROSE 50 % IV SOLN
1.0000 | Freq: Once | INTRAVENOUS | Status: AC
  Administered 2016-04-11: 50 mL via INTRAVENOUS
  Filled 2016-04-11: qty 50

## 2016-04-11 MED ORDER — GLUCERNA SHAKE PO LIQD: 237.0000 mL | Freq: Two times a day (BID) | ORAL | Status: DC

## 2016-04-11 MED ORDER — HYDROCORTISONE 10 MG PO TABS
10.0000 mg | ORAL_TABLET | Freq: Two times a day (BID) | ORAL | Status: DC
  Administered 2016-04-11 – 2016-04-12 (×3): 10 mg via ORAL
  Filled 2016-04-11 (×5): qty 1

## 2016-04-11 MED ORDER — INSULIN ASPART 100 UNIT/ML ~~LOC~~ SOLN
0.0000 [IU] | Freq: Three times a day (TID) | SUBCUTANEOUS | Status: DC
  Administered 2016-04-11 – 2016-04-12 (×2): 2 [IU] via SUBCUTANEOUS
  Filled 2016-04-11: qty 1

## 2016-04-11 NOTE — H&P (Signed)
History and Physical    [name] [id number] DOB: [birth year] DOA: 04/11/2016  PCP: Pcp Not In System   Patient coming from: Home  Chief Complaint: weakness with multiple falls  HPI: [name] is a 76 y.o. 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 EMS found him to be orthostatic and hypoglycemic with glucose 64 mg/dL. Patient was treated with oral glucose by EMS and CBG improved to 110 mg/dL. Patient denied chest pain, shortness of breath, complained of fullness, nausea, vomiting at least twice a day for the last few weeks, weakness, dizziness. ED Course: Upon arrival to the ED he was given a small bolus if IV NS and symptoms improved. Nevertheless, after ED RN attempted to ambulate him, he again became symptomatic with dizziness, weakness and nausea and BP again showed orthostatic drop. Blood work revealed stable anemia, abnormally elevated creatinine 2.11 (base line creatinine 1.79), hypokalemia of 2.9, WBCs were mildly elevated - 11,800   Review of Systems: As per HPI otherwise 10 point review of systems negative.   Ambulatory Status: Ambulates with a walker or cane  Past Medical History:  Diagnosis Date  . Cataract   . CKD (chronic kidney disease) 02/2016  . Diabetes mellitus, type 2 ([place]) 2008  . Hyperlipidemia 2008  . Hypertension 2008  . Prostate cancer ([place]) 2006    Past Surgical History:  Procedure Laterality Date  . COLONOSCOPY W/ POLYPECTOMY  03/2008   diminutive rectal polyp (path: prolapse type polyp, not adenoma).  moderate pan-diverticulitis.   Marland Kitchen ESOPHAGEAL MANOMETRY  1984   unable to pass manometry catheter beyond LES. Aperiystalsis of esophagus: sugg of achalasia.   Marland Kitchen  ESOPHAGOGASTRODUODENOSCOPY N/A 02/17/2016   Procedure: ESOPHAGOGASTRODUODENOSCOPY (EGD);  Surgeon: [name], MD;  Location: [facility];  Service: Endoscopy;  Laterality: N/A;  . ESOPHAGOGASTRODUODENOSCOPY (EGD) WITH PROPOFOL N/A 02/21/2016   Procedure: ESOPHAGOGASTRODUODENOSCOPY (EGD) WITH PROPOFOL;  Surgeon: Jerene Bears, MD;  Location: [facility];  Service: Gastroenterology;  Laterality: N/A;  . ESOPHAGOGASTRODUODENOSCOPY (EGD) WITH PROPOFOL N/A 02/28/2016   Procedure: ESOPHAGOGASTRODUODENOSCOPY (EGD) WITH PROPOFOL withBOTOX;  Surgeon: Manus Gunning, MD;  Location: [facility];  Service: Gastroenterology;  Laterality: N/A;  . PROSTATE SURGERY     radiation therapy only    Social History   Social History  . Marital status: Married    Spouse name: N/A  . Number of children: N/A  . Years of education: N/A   Occupational History  . retired    Social History Main Topics  . Smoking status: Former Smoker    Years: 10.00    Quit date: 2000  . Smokeless tobacco: Never Used  . Alcohol use No  . Drug use: No  . Sexual activity: Not on file   Other Topics Concern  . Not on file   Social History Narrative  . No narrative on file    No Known Allergies  Family History  Problem Relation Age of Onset  . Diabetes Father   . Diabetes Sister   . Hypertension Sister   . Hypertension Brother   . Heart disease Neg Hx     Prior to Admission medications   Medication Sig Start Date End Date Taking? Authorizing Provider  acetaminophen (TYLENOL) 650 MG CR tablet Take  650 mg by mouth every 4 (four) hours as needed for pain.   Yes Historical Provider, MD  atenolol (TENORMIN) 25 MG tablet Take 25 mg by mouth daily.   Yes Historical Provider, MD  atorvastatin (LIPITOR) 10 MG tablet Take 1 tablet (10 mg total) by mouth daily after supper. 10/10/15  Yes Elayne Snare, MD  docusate sodium (COLACE) 100 MG capsule Take 1 capsule (100 mg total) by mouth 2 (two) times daily. 02/02/16   Yes Donne Hazel, MD  enoxaparin (LOVENOX) 60 MG/0.6ML injection Inject 0.6 mLs (60 mg total) into the skin daily. 02/02/16  Yes Donne Hazel, MD  feeding supplement, GLUCERNA SHAKE, (GLUCERNA SHAKE) LIQD Take 237 mLs by mouth 2 (two) times daily between meals.   Yes Historical Provider, MD  ferrous sulfate 325 (65 FE) MG tablet Take 325 mg by mouth daily. 04/01/16  Yes Historical Provider, MD  furosemide (LASIX) 40 MG tablet Take 40 mg by mouth daily. 04/01/16  Yes Historical Provider, MD  glipiZIDE (GLUCOTROL XL) 2.5 MG 24 hr tablet Take 2.5 mg by mouth daily with breakfast.   Yes Historical Provider, MD  hydrocortisone (CORTEF) 10 MG tablet Take 1 tablet (10 mg total) by mouth 2 (two) times daily. 02/29/16  Yes Barton Dubois, MD  KLOR-CON M20 20 MEQ tablet Take 20 mEq by mouth daily. 04/01/16  Yes Historical Provider, MD  Multiple Vitamin (MULTIVITAMIN WITH MINERALS) TABS tablet Take 1 tablet by mouth daily.   Yes Historical Provider, MD  ONE TOUCH ULTRA TEST test strip USE TO TEST ONCE DAILY 08/29/15  Yes Elayne Snare, MD  [facility] DELICA LANCETS 99991111 MISC USE TO TEST ONCE DAILY Dx code E11.65 01/04/16  Yes Elayne Snare, MD  pantoprazole (PROTONIX) 40 MG tablet Take 1 tablet (40 mg total) by mouth 2 (two) times daily. 02/29/16  Yes Barton Dubois, MD  pioglitazone (ACTOS) 30 MG tablet TAKE 1 TABLET BY MOUTH EVERY DAY Patient taking differently: TAKE 30mg  TABLET BY MOUTH EVERY DAY 01/02/16  Yes Elayne Snare, MD  polyethylene glycol (MIRALAX / GLYCOLAX) packet Take 17 g by mouth daily. Patient taking differently: Take 17 g by mouth every other day.  02/03/16  Yes Donne Hazel, MD  TRADJENTA 5 MG TABS tablet TAKE 1 TABLET (5 MG TOTAL) BY MOUTH DAILY. 02/24/16  Yes Elayne Snare, MD  ZYTIGA 250 MG tablet TAKE 4 TABLETS (1,000MG ) BY MOUTH EVERY DAY ON AN EMPTY STOMACH 1 HOURBEFORE OR 2 HOURS AFTER A MEAL 03/19/16  Yes Wyatt Portela, MD    Physical Exam: Vitals:   04/11/16 1200 04/11/16 1230 04/11/16  1300 04/11/16 1500  BP: 96/65 119/65 132/79   Pulse: 75 75 78   Resp: 26 18 17    Temp:      TempSrc:      SpO2: 95% 94% 91%   Weight:    64.4 kg (141 lb 15.6 oz)     General: Appears calm and comfortable Eyes: PERRLA, EOMI, normal lids, iris ENT:  grossly normal hearing, lips & tongue, mucous membranes moist and intact Neck: no lymphoadenopathy, masses or thyromegaly Cardiovascular: RRR, no m/r/g. No JVD, carotid bruits. RLE slightly larger than left Respiratory: bilateral no wheezes, rales, rhonchi or cracles. Normal respiratory effort. No accessory muscle use observed Abdomen: soft, non-tender, non-distended, no organomegaly or masses appreciated. BS present in all quadrants Skin: no rash, ulcers or induration seen on limited exam Musculoskeletal: grossly normal tone BUE/BLE, good ROM, no bony abnormality or joint deformities observed Psychiatric: grossly  normal mood and affect, speech fluent and appropriate, alert and oriented x3 Neurologic: CN II-XII grossly intact, moves all extremities in coordinated fashion, sensation intact  Labs on Admission: I have personally reviewed following labs and imaging studies  CBC, BMP  GFR: Estimated Creatinine Clearance: 27.6 mL/min (by C-G formula based on SCr of 2.11 mg/dL (H)).  Creatinine Clearance: Estimated Creatinine Clearance: 27.6 mL/min (by C-G formula based on SCr of 2.11 mg/dL (H)).  Radiological Exams on Admission: No results found.  EKG: Independently reviewed - A. fib with no acute ST-T wave changes  Assessment/Plan Principal Problem:   Dehydration Active Problems:   Diabetes mellitus type 2 in nonobese [facility])   Essential hypertension   Chronic deep vein thrombosis (DVT) (HCC)   Hypokalemia   Acute kidney injury superimposed on chronic kidney disease (HCC)   Orthostatic hypotension    Dehydration with orthostatic BP drop - patient apparently has inadequate amount of liquid oral intake complicated by intermittent  vomiting due to esophageal dysmotility. He was found to have positive orthostatic blood pressure. 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 Last LES injection in 02/28/2016.  This has been an ongoing problem with him feeling full and being unable to keep fluids down  He has been seen by [facility] in the past - will ask GI to see patient with recommendations  Acute exacerbation of CKD stage III Continue hydration IV and follow renal function  Chronic DVT Will ask pharmacy to dose Lovenox in light of acute exacerbation of CKD and recent GI bleed  DM type II Will continue to hold oral hypoglycemics given hypoglycemia Continue SSI and monitor CBG's  Hypokalemia  We'll check magnesium level Administer IV potassium and liquid oral potassium supplementation Recheck the level in the morning  H/O prostate CA - continue Zytiga   DVT prophylaxis: Lovenox Code Status: Full Family Communication: daughter at bedside Disposition Plan: medsurg Consults called: GI [place] Admission status: observation  York Grice, [place] Pager: [phone] [facility]  If 7PM-7AM, please contact night-coverage [email or web address] Password [facility]  04/11/2016, 3:39 PM

## 2016-04-11 NOTE — ED Notes (Signed)
PT CBG was 59 nurse and MD notified

## 2016-04-11 NOTE — ED Notes (Signed)
Pt initial CBG was 64 and given oral glucose by EMS with last CBG was 100

## 2016-04-11 NOTE — ED Provider Notes (Signed)
[facility] DEPT Provider Note   CSN: [id number] Arrival date & time: 04/11/16  1057     History   Chief Complaint Chief Complaint  Patient presents with  . Weakness    HPI [name] is a 76 y.o. male.  The history is provided by the patient.  Near Syncope  This is a new problem. The current episode started 1 to 2 hours ago. Episode frequency: once. The problem has been resolved. Pertinent negatives include no chest pain, no abdominal pain, no headaches and no shortness of breath. The symptoms are aggravated by standing. The symptoms are relieved by lying down. He has tried nothing for the symptoms. The treatment provided no relief.   Patient reported that the near-syncopal episode occurred while walking to the bathroom. Patient reported that he felt lightheaded with tunnel vision just prior to the episode. Reports falling to the ground however denies any traumatic injuries.  Patient has a history of achalasia on liquid diet and reports adequate intake; however family feels like he does not have adequate by mouth intake.  Patient also recently diagnosed with chronic right lower leg DVT; supposed to be on Lovenox however upon recent discharge from the hospital several weeks ago, Lovenox was not restarted at the nursing facility. It looks like they attempted to contact the primary care doctor or were unsuccessful in getting a prescription for the Lovenox. Patient denied any chest pain, shortness of breath prior to the near syncopal episode.  EMS was contacted and noted patient to be orthostatic on their arrival. They also noted hypoglycemia with sugars 64. Patient was provided with IV fluids and dextrose.   Past Medical History:  Diagnosis Date  . Cataract   . CKD (chronic kidney disease) 02/2016  . Diabetes mellitus, type 2 ([place]) 2008  . Hyperlipidemia 2008  . Hypertension 2008  . Prostate cancer [facility]) 2006    Patient Active Problem List   Diagnosis Date Noted  .  Hypoglycemia 04/11/2016  . Orthostatic hypotension 04/11/2016  . Symptomatic anemia 03/14/2016  . Sepsis ([place]) 02/21/2016  . Heme positive stool   . Pressure injury of skin 02/19/2016  . Hypernatremia 02/19/2016  . Acute esophagitis   . Food impaction of esophagus   . Achalasia   . Acute GI bleeding 02/16/2016  . Acute blood loss anemia 02/16/2016  . Anemia 02/15/2016  . Weakness 01/29/2016  . Right leg weakness 01/29/2016  . HCAP (healthcare-associated pneumonia) 01/28/2016  . Dehydration   . Hypomagnesemia   . Protein-calorie malnutrition, severe 01/09/2016  . Hyperglycemia 01/08/2016  . Hypokalemia 01/08/2016  . Acute kidney injury superimposed on chronic kidney disease ([place]) 01/08/2016  . Prolonged Q-T interval on ECG 01/08/2016  . 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 Bilateral lower extremity edema 09/07/2013  . Type II or unspecified type diabetes mellitus without mention of complication, uncontrolled 05/20/2013  . Pulmonary embolus ([place]) 09/01/2010  . Chronic deep vein thrombosis (DVT) ([place]) 08/25/2010  . Diabetes mellitus type 2 in nonobese ([place]) 09/15/2006  . Hyperlipidemia associated with type 2 diabetes mellitus ([place]) 09/13/2006  . Essential hypertension 09/13/2006  . Disorder resulting from impaired renal function 09/13/2006  . PROSTATE CANCER, HX OF 09/13/2006    Past Surgical History:  Procedure Laterality Date  . COLONOSCOPY W/ POLYPECTOMY  03/2008   diminutive rectal polyp (path: prolapse type polyp, not adenoma).  moderate pan-diverticulitis.   Marland Kitchen ESOPHAGEAL MANOMETRY  1984   unable to pass manometry catheter beyond LES. Aperiystalsis of esophagus: sugg of  achalasia.   Marland Kitchen ESOPHAGOGASTRODUODENOSCOPY N/A 02/17/2016   Procedure: ESOPHAGOGASTRODUODENOSCOPY (EGD);  Surgeon: [name], MD;  Location: [facility];  Service: Endoscopy;  Laterality: N/A;  . ESOPHAGOGASTRODUODENOSCOPY (EGD) WITH PROPOFOL N/A 02/21/2016   Procedure: ESOPHAGOGASTRODUODENOSCOPY  (EGD) WITH PROPOFOL;  Surgeon: Jerene Bears, MD;  Location: [facility];  Service: Gastroenterology;  Laterality: N/A;  . ESOPHAGOGASTRODUODENOSCOPY (EGD) WITH PROPOFOL N/A 02/28/2016   Procedure: ESOPHAGOGASTRODUODENOSCOPY (EGD) WITH PROPOFOL withBOTOX;  Surgeon: Manus Gunning, MD;  Location: [facility];  Service: Gastroenterology;  Laterality: N/A;  . PROSTATE SURGERY     radiation therapy only       Home Medications    Prior to Admission medications   Medication Sig Start Date End Date Taking? Authorizing Provider  acetaminophen (TYLENOL) 650 MG CR tablet Take 650 mg by mouth every 4 (four) hours as needed for pain.   Yes Historical Provider, MD  atenolol (TENORMIN) 25 MG tablet Take 25 mg by mouth daily.   Yes Historical Provider, MD  atorvastatin (LIPITOR) 10 MG tablet Take 1 tablet (10 mg total) by mouth daily after supper. 10/10/15  Yes Elayne Snare, MD  docusate sodium (COLACE) 100 MG capsule Take 1 capsule (100 mg total) by mouth 2 (two) times daily. 02/02/16  Yes Donne Hazel, MD  enoxaparin (LOVENOX) 60 MG/0.6ML injection Inject 0.6 mLs (60 mg total) into the skin daily. 02/02/16  Yes Donne Hazel, MD  feeding supplement, GLUCERNA SHAKE, (GLUCERNA SHAKE) LIQD Take 237 mLs by mouth 2 (two) times daily between meals.   Yes Historical Provider, MD  ferrous sulfate 325 (65 FE) MG tablet Take 325 mg by mouth daily. 04/01/16  Yes Historical Provider, MD  furosemide (LASIX) 40 MG tablet Take 40 mg by mouth daily. 04/01/16  Yes Historical Provider, MD  glipiZIDE (GLUCOTROL XL) 2.5 MG 24 hr tablet Take 2.5 mg by mouth daily with breakfast.   Yes Historical Provider, MD  hydrocortisone (CORTEF) 10 MG tablet Take 1 tablet (10 mg total) by mouth 2 (two) times daily. 02/29/16  Yes Barton Dubois, MD  KLOR-CON M20 20 MEQ tablet Take 20 mEq by mouth daily. 04/01/16  Yes Historical Provider, MD  Multiple Vitamin (MULTIVITAMIN WITH MINERALS) TABS tablet Take 1 tablet by mouth daily.    Yes Historical Provider, MD  ONE TOUCH ULTRA TEST test strip USE TO TEST ONCE DAILY 08/29/15  Yes Elayne Snare, MD  [facility] DELICA LANCETS 99991111 MISC USE TO TEST ONCE DAILY Dx code E11.65 01/04/16  Yes Elayne Snare, MD  pantoprazole (PROTONIX) 40 MG tablet Take 1 tablet (40 mg total) by mouth 2 (two) times daily. 02/29/16  Yes Barton Dubois, MD  pioglitazone (ACTOS) 30 MG tablet TAKE 1 TABLET BY MOUTH EVERY DAY Patient taking differently: TAKE 30mg  TABLET BY MOUTH EVERY DAY 01/02/16  Yes Elayne Snare, MD  polyethylene glycol (MIRALAX / GLYCOLAX) packet Take 17 g by mouth daily. Patient taking differently: Take 17 g by mouth every other day.  02/03/16  Yes Donne Hazel, MD  TRADJENTA 5 MG TABS tablet TAKE 1 TABLET (5 MG TOTAL) BY MOUTH DAILY. 02/24/16  Yes Elayne Snare, MD  ZYTIGA 250 MG tablet TAKE 4 TABLETS (1,000MG ) BY MOUTH EVERY DAY ON AN EMPTY STOMACH 1 HOURBEFORE OR 2 HOURS AFTER A MEAL 03/19/16  Yes Wyatt Portela, MD    Family History Family History  Problem Relation Age of Onset  . Diabetes Father   . Diabetes Sister   . Hypertension Sister   .  Hypertension Brother   . Heart disease Neg Hx     Social History Social History  Substance Use Topics  . Smoking status: Former Smoker    Years: 10.00    Quit date: 2000  . Smokeless tobacco: Never Used  . Alcohol use No     Allergies   Patient has no known allergies.   Review of Systems Review of Systems  Respiratory: Negative for shortness of breath.   Cardiovascular: Positive for near-syncope. Negative for chest pain.  Gastrointestinal: Negative for abdominal pain.  Neurological: Negative for headaches.  Ten systems are reviewed and are negative for acute change except as noted in the HPI    Physical Exam Updated Vital Signs BP 129/77   Pulse 73   Temp 97.7 F (36.5 C) (Oral)   Resp 24   Wt 141 lb 15.6 oz (64.4 kg)   SpO2 97%   BMI 21.59 kg/m   Physical Exam  Constitutional: He is oriented to person, place, and  time. He appears well-developed and well-nourished. No distress.  HENT:  Head: Normocephalic and atraumatic.  Nose: Nose normal.  Eyes: Conjunctivae and EOM are normal. Pupils are equal, round, and reactive to light. Right eye exhibits no discharge. Left eye exhibits no discharge. No scleral icterus.  Neck: Normal range of motion. Neck supple.  Cardiovascular: Normal rate and regular rhythm.  Exam reveals no gallop and no friction rub.   No murmur heard. Pulmonary/Chest: Effort normal and breath sounds normal. No stridor. No respiratory distress. He has no rales.  Abdominal: Soft. He exhibits no distension. There is no tenderness.  Musculoskeletal: He exhibits no edema or tenderness.  Neurological: He is alert and oriented to person, place, and time.  Skin: Skin is warm and dry. No rash noted. He is not diaphoretic. No erythema.  Psychiatric: He has a normal mood and affect.  Vitals reviewed.    ED Treatments / Results  Labs (all labs ordered are listed, but only abnormal results are displayed) Labs Reviewed  CBC WITH DIFFERENTIAL/PLATELET - Abnormal; Notable for the following:       Result Value   WBC 11.8 (*)    RBC 3.59 (*)    Hemoglobin 10.4 (*)    HCT 31.3 (*)    RDW 16.8 (*)    Neutro Abs 10.0 (*)    All other components within normal limits  BASIC METABOLIC PANEL - Abnormal; Notable for the following:    Potassium 2.9 (*)    Creatinine, Ser 2.11 (*)    Calcium 7.8 (*)    GFR calc non Af Amer 29 (*)    GFR calc Af Amer 34 (*)    All other components within normal limits  CBG MONITORING, ED - Abnormal; Notable for the following:    Glucose-Capillary 59 (*)    All other components within normal limits  CBG MONITORING, ED - Abnormal; Notable for the following:    Glucose-Capillary 120 (*)    All other components within normal limits  MAGNESIUM    EKG  EKG Interpretation  Date/Time:  Wednesday April 11 2016 12:06:53 EST Ventricular Rate:  75 PR Interval:      QRS Duration: 104 QT Interval:  425 QTC Calculation: 475 R Axis:   -3 Text Interpretation:  Normal sinus rhythm prequent PAC Anterior infarct, old baseline artifact Otherwise no significant change Reconfirmed by [facility] MD, PEDRO [id number]) on 04/11/2016 12:25:13 PM       Radiology No results found.  Procedures Procedures (including  critical care time)  Medications Ordered in ED Medications  ferrous sulfate tablet 325 mg (not administered)  abiraterone Acetate (ZYTIGA) tablet 1,000 mg (not administered)  acetaminophen (TYLENOL) CR tablet 650 mg (not administered)  atenolol (TENORMIN) tablet 25 mg (25 mg Oral Given 04/11/16 1600)  feeding supplement (GLUCERNA SHAKE) (GLUCERNA SHAKE) liquid 237 mL (not administered)  hydrocortisone (CORTEF) tablet 10 mg (not administered)  pantoprazole (PROTONIX) EC tablet 40 mg (40 mg Oral Given 04/11/16 1601)  docusate sodium (COLACE) capsule 100 mg (not administered)  enoxaparin (LOVENOX) injection 60 mg (60 mg Subcutaneous Given 04/11/16 1604)  polyethylene glycol (MIRALAX / GLYCOLAX) packet 17 g (not administered)  atorvastatin (LIPITOR) tablet 10 mg (not administered)  0.9 % NaCl with KCl 20 mEq/ L  infusion (not administered)  insulin aspart (novoLOG) injection 0-9 Units (not administered)  potassium chloride 20 MEQ/15ML (10%) solution 20 mEq (20 mEq Oral Given 04/11/16 1602)  sodium chloride 0.9 % bolus 1,000 mL (0 mLs Intravenous Stopped 04/11/16 1500)  dextrose 50 % solution 50 mL (50 mLs Intravenous Given 04/11/16 1245)     Initial Impression / Assessment and Plan / ED Course  I have reviewed the triage vital signs and the nursing notes.  Pertinent labs & imaging results that were available during my care of the patient were reviewed by me and considered in my medical decision making (see chart for details).  Clinical Course     Patient noted to be orthostatic. Given additional IV fluid boluses. Patient also noted to be hypoglycemic on repeat  CBG. Given additional D50 and given juice. Patient is on glipizide and to [place]. EKG noted to be sinus rhythm with frequent PACs. Low suspicion for PE as cause of symptoms. More related to orthostasis and hypoglycemia. Requires admission for continued hydration and hypoglycemia monitoring and treatment.    Final Clinical Impressions(s) / ED Diagnoses   Final diagnoses:  Dehydration  Orthostasis  Hypoglycemia      Fatima Blank, MD 04/11/16 1659

## 2016-04-11 NOTE — Telephone Encounter (Signed)
Let [facility] know that Dr. Dwyane Dee is not the pt's PCP.   But thank you for letting us know he fell.

## 2016-04-11 NOTE — Consult Note (Signed)
[facility] Consult: 4:38 PM 04/11/2016  LOS: 0 days    Referring Provider: Dr Marily Memos  Primary Care Physician:  Pcp Not In System Primary Gastroenterologist:  Dr. Scarlette Shorts     Reason for Consultation:  Regurgitation, dysphagia.     IMPRESSION:   *  Regurgitation inpatient with achalasia. Has not been compliant with liquid diet. Question gastroparesis (retained gastric contents on EGD of 02/28/16), this may be contributing as well.  In ED today, keeping down meds and clears.   *  Hypokalemia.    *  AKI  *  Prostate cancer. On Zytiga.  Patient has not been seen at the oncology office since 01/25/16.   *  Normocytic anemia. Current hemoglobin of about 10.5 is improved compared with earlier this month.    *  DVT.  Diagnosed late 01/2016, on Lovenox.   * Malnutrition  PLAN:     *  For now keep him on clear liquids.  Will make him nothing by mouth at midnight, in case Dr. Carlean Purl wants to set him up for EGD tomorrow.   Note ongoing Lovenox, last given at 4 PM today.  *  Strict compliance with liquid diet.    Azucena Freed  04/11/2016, 4:38 PM Pager: [phone]     [facility] Attending   I have taken an interval history, reviewed the chart and examined the patient. I agree with the Advanced Practitioner's note, impression and recommendations.   Difficult and complicated situation - he just had Botox 6 weeks ago so doubt repeating worthwhile. ? In my mind is what is functional status of his esophagus - will attempt Ba swallow tomorrow - try water soluble contrast first.  Depending upon those results could need an EGD if retained food/obstruction (though he is now tolerating liquids)  ? If balloon dilation would be an option - can review w/ Dr. Silverio Decamp though she is not here this  week.  Could need repeat manometric study before any aggressive intervention  Needs to comply with liquid diet  Another option could be gastrostomy to support if cannot maintain adequate po intajke  Gatha Mayer, MD, [facility] [phone] (pager) [phone] after 5 PM, weekends and holidays  04/11/2016 6:25 PM    HPI: [name] is a 76 y.o. male.  PMH DM2 on oral agents.  HTN.  HLD.  Metastatic prostate cancer, original Dx 2006; treated with radiation seed implantation as well as androgen deprivation therapy. PSA 38 01/25/16, bone scan 11/2015 with recurrence in prostate.  Started Fabio Asa 12/2015 which was temporarily discontinued but he is now back taking it. Colonoscopy 2009: scattered pan-diverticulosis and prolapse type small rectal polyp. Esophageal manometry 1984 sugg of achalasia.  10/21 - 02/02/16 admission from home with weakness, anorexia, unsteady gait.  Imaging with ? PNA, sclerotic lesion in C 3 (? Mets). Diagnosed with new RLE DVT.  Discharge to SNF on Lovenox and po iron.  Received PRBC x 1 for heme negative anemia, Hgb 6.8.  Hgb up to 8.2 at discharge.  Patient was seen as  an inpatient consultation on 02/16/2016 during admission with suspected aspiration pneumonia.. It been about 8 years since he had seen Dr. Henrene Pastor.  Reason for the evaluation was anemia with Hgb 7.1. He was FOBT positive but had no recent melena or bleeding per rectum. He was transfused with PRBC 2 and Hgb rose to 9.5. Patient repeatedly  denied any dysphagia, swallowing issues or nausea vomiting etc.  02/17/2016 EGD. Dr. Ardis Hughs,  for anemia, FOBT positive stool. Very dilated, fluid-filled esophagus cleared with cough and at, lavage, suctioning following airway intubation. Suspect this is due to achalasia. Mucosa at GE junction was abnormal, the lumen was narrowed. Biopsies obtained and showed reactive changes/inflammation.. 02/21/16 EGD. Dr. Hilarie Fredrickson.  Esophageal lumen severely  dilated with pooled liquid and debris including medication and pills. 500 mL of fluid aspirated. Severe esophagitis in the middle and lower third of the esophagus and at the GE junction. Gastritis also noted. Due to patient having hypotension during the procedure and possibly a developing sepsis, plans for Botox to the LES was aborted. 02/28/16 EGD. Dr. Havery Moros. Upper and middle esophagus dilated with residual liquid contents requiring suctioning. Esophagitis appeared to be improved. Large amount of food residue noted in the stomach. Botox was injected to the LES.  Patient was advised following these recent endoscopies to stick to a liquid diet indefinitely.    Dr. Fuller Plan saw him on 03/14/16 for inpatient consultation regarding his not eating or drinking much. Patient himself was quite confused.  His electrolytes were deranged.  His hemoglobin had dropped again to 7.1 and he received at least 2 units PRBCs.  Dr. Fuller Plan wanted to repeat EGD and perform colonoscopy but these were canceled as he couldn't drink the bowel prep.  GI signed off.  Patient was discharged from SNF to home on Christmas eve. Since then he has not been compliant with a totally liquid diet. He admits to eating grits, eggs and perhaps other verboten foods.  He has been regurgitating, although he goes into the bathroom and does this in private. He is evasive in regards to how often he is regurgitating.  He denies nausea. The substance regurgitated mostly looks like partially digested food and phlegm.. Patient was returned to the ED today with weakness, he fell twice at home.Marland Kitchen He was orthostatic and hypoglycemic with glucose 64. This improved with oral glucose per EMS. Patient reports vomiting at least twice a day for the last few weeks along with the weakness/dizziness.   He denies pain or difficulty breathing. He minimizes his swallowing issues. Labs reveal AKI, low albumin consistent with malnutrition. Low potassium at 2.9. Latest serum  glucose is 82. Patient has not undergone any radiologic imaging yet..    Past Medical History:  Diagnosis Date  . Cataract   . CKD (chronic kidney disease) 02/2016  . Diabetes mellitus, type 2 ([place]) 2008  . Hyperlipidemia 2008  . Hypertension 2008  . Prostate cancer ([place]) 2006    Past Surgical History:  Procedure Laterality Date  . COLONOSCOPY W/ POLYPECTOMY  03/2008   diminutive rectal polyp (path: prolapse type polyp, not adenoma).  moderate pan-diverticulitis.   Marland Kitchen ESOPHAGEAL MANOMETRY  1984   unable to pass manometry catheter beyond LES. Aperiystalsis of esophagus: sugg of achalasia.   Marland Kitchen ESOPHAGOGASTRODUODENOSCOPY N/A 02/17/2016   Procedure: ESOPHAGOGASTRODUODENOSCOPY (EGD);  Surgeon: [name], MD;  Location: [facility];  Service: Endoscopy;  Laterality: N/A;  . ESOPHAGOGASTRODUODENOSCOPY (EGD) WITH PROPOFOL N/A 02/21/2016   Procedure: ESOPHAGOGASTRODUODENOSCOPY (EGD) WITH PROPOFOL;  Surgeon: Ulice Dash  Everitt Amber, MD;  Location: [facility];  Service: Gastroenterology;  Laterality: N/A;  . ESOPHAGOGASTRODUODENOSCOPY (EGD) WITH PROPOFOL N/A 02/28/2016   Procedure: ESOPHAGOGASTRODUODENOSCOPY (EGD) WITH PROPOFOL withBOTOX;  Surgeon: Manus Gunning, MD;  Location: [facility];  Service: Gastroenterology;  Laterality: N/A;  . PROSTATE SURGERY     radiation therapy only    Prior to Admission medications   Medication Sig Start Date End Date Taking? Authorizing Provider  acetaminophen (TYLENOL) 650 MG CR tablet Take 650 mg by mouth every 4 (four) hours as needed for pain.   Yes Historical Provider, MD  atenolol (TENORMIN) 25 MG tablet Take 25 mg by mouth daily.   Yes Historical Provider, MD  atorvastatin (LIPITOR) 10 MG tablet Take 1 tablet (10 mg total) by mouth daily after supper. 10/10/15  Yes Elayne Snare, MD  docusate sodium (COLACE) 100 MG capsule Take 1 capsule (100 mg total) by mouth 2 (two) times daily. 02/02/16  Yes Donne Hazel, MD  enoxaparin (LOVENOX) 60 MG/0.6ML  injection Inject 0.6 mLs (60 mg total) into the skin daily. 02/02/16  Yes Donne Hazel, MD  feeding supplement, GLUCERNA SHAKE, (GLUCERNA SHAKE) LIQD Take 237 mLs by mouth 2 (two) times daily between meals.   Yes Historical Provider, MD  ferrous sulfate 325 (65 FE) MG tablet Take 325 mg by mouth daily. 04/01/16  Yes Historical Provider, MD  furosemide (LASIX) 40 MG tablet Take 40 mg by mouth daily. 04/01/16  Yes Historical Provider, MD  glipiZIDE (GLUCOTROL XL) 2.5 MG 24 hr tablet Take 2.5 mg by mouth daily with breakfast.   Yes Historical Provider, MD  hydrocortisone (CORTEF) 10 MG tablet Take 1 tablet (10 mg total) by mouth 2 (two) times daily. 02/29/16  Yes Barton Dubois, MD  KLOR-CON M20 20 MEQ tablet Take 20 mEq by mouth daily. 04/01/16  Yes Historical Provider, MD  Multiple Vitamin (MULTIVITAMIN WITH MINERALS) TABS tablet Take 1 tablet by mouth daily.   Yes Historical Provider, MD  ONE TOUCH ULTRA TEST test strip USE TO TEST ONCE DAILY 08/29/15  Yes Elayne Snare, MD  [facility] DELICA LANCETS [id number] MISC USE TO TEST ONCE DAILY Dx code E11.65 01/04/16  Yes Elayne Snare, MD  pantoprazole (PROTONIX) 40 MG tablet Take 1 tablet (40 mg total) by mouth 2 (two) times daily. 02/29/16  Yes Barton Dubois, MD  pioglitazone (ACTOS) 30 MG tablet TAKE 1 TABLET BY MOUTH EVERY DAY Patient taking differently: TAKE 30mg  TABLET BY MOUTH EVERY DAY 01/02/16  Yes Elayne Snare, MD  polyethylene glycol (MIRALAX / GLYCOLAX) packet Take 17 g by mouth daily. Patient taking differently: Take 17 g by mouth every other day.  02/03/16  Yes Donne Hazel, MD  TRADJENTA 5 MG TABS tablet TAKE 1 TABLET (5 MG TOTAL) BY MOUTH DAILY. 02/24/16  Yes Elayne Snare, MD  ZYTIGA 250 MG tablet TAKE 4 TABLETS (1,000MG ) BY MOUTH EVERY DAY ON AN EMPTY STOMACH 1 HOURBEFORE OR 2 HOURS AFTER A MEAL 03/19/16  Yes Wyatt Portela, MD    Scheduled Meds: . abiraterone Acetate  1,000 mg Oral Daily  . acetaminophen  650 mg Oral Q8H  . atenolol  25 mg Oral  Daily  . atorvastatin  10 mg Oral QPC supper  . docusate sodium  100 mg Oral BID  . enoxaparin  60 mg Subcutaneous Q24H  . feeding supplement (GLUCERNA SHAKE)  237 mL Oral BID BM  . ferrous sulfate  325 mg Oral Daily  . hydrocortisone  10 mg Oral BID  .  insulin aspart  0-9 Units Subcutaneous TID WC  . pantoprazole  40 mg Oral BID  . polyethylene glycol  17 g Oral QODAY  . potassium chloride  20 mEq Oral TID   Infusions: . 0.9 % NaCl with KCl 20 mEq / L     PRN Meds:    Allergies as of 04/11/2016  . (No Known Allergies)    Family History  Problem Relation Age of Onset  . Diabetes Father   . Diabetes Sister   . Hypertension Sister   . Hypertension Brother   . Heart disease Neg Hx     Social History   Social History  . Marital status: Married    Spouse name: N/A  . Number of children: N/A  . Years of education: N/A   Occupational History  . retired    Social History Main Topics  . Smoking status: Former Smoker    Years: 10.00    Quit date: 2000  . Smokeless tobacco: Never Used  . Alcohol use No  . Drug use: No  . Sexual activity: Not on file   Other Topics Concern  . Not on file   Social History Narrative  . No narrative on file    REVIEW OF SYSTEMS: Constitutional:  Generally spends most of the day in a chair or in bed. ENT:  No nose bleeds Pulm:  Cough is productive of clear sputum. CV:  No palpitations, no LE edema. No chest pain GU:  No hematuria, no frequency.  Denies oliguria GI:  Per HPI.  Says he is having regular bowel movements. Heme:  No unusual bleeding or bruising.   Transfusions:  Per HPI Neuro:  No headaches, no peripheral tingling or numbness Derm:  No itching, no rash or sores.  Endocrine:  No sweats or chills.  No polyuria or dysuria Immunization:  Did not inquire. Travel:  None beyond local counties in last few months.    PHYSICAL EXAM: Vital signs in last 24 hours: Vitals:   04/11/16 1530 04/11/16 1630  BP: 117/76 129/77    Pulse:  73  Resp:  24  Temp:     Wt Readings from Last 3 Encounters:  04/11/16 64.4 kg (141 lb 15.6 oz)  03/14/16 64.4 kg (141 lb 15.6 oz)  02/28/16 66.2 kg (146 lb)    General: Frail, unwell-appearing, comfortable AAM. Head:  No facial asymmetry or swelling.  Eyes:  No scleral icterus or conjunctival pallor. Ears:  No obvious hearing loss.  Nose:  No congestion or discharge Mouth:  Few teeth remaining. Mucosa is moist and clear. Tongue is midline. Neck:  No masses, no JVD, no TMG. Lungs:  Overall reduced but no adventitious sounds. No dyspnea or cough. Heart: RRR. No MRG. S1, S2 present Abdomen:  Soft. Not distended. Not tender. Bowel sounds active. No masses or HSM.Marland Kitchen   Rectal: Deferred   Musc/Skeltl: No joint erythema or gross deformity. He is kyphotic. Extremities:  2-3+ pedal edema.  Neurologic:  Patient is alert. He is oriented 3. Moves all 4 limbs, strength was not tested. No tremor. Skin:  No rashes or sores. No significant hematoma. Nodes:  No cervical adenopathy.   Psych:  Calm, cooperative.  Intake/Output from previous day: No intake/output data recorded. Intake/Output this shift: No intake/output data recorded.  LAB RESULTS:  Recent Labs  04/11/16 1113  WBC 11.8*  HGB 10.4*  HCT 31.3*  PLT 297   BMET Lab Results  Component Value Date   NA 142 04/11/2016  NA 144 04/10/2016   NA 149 (H) 03/17/2016   K 2.9 (L) 04/11/2016   K 3.5 04/10/2016   K 3.5 03/17/2016   CL 106 04/11/2016   CL 106 04/10/2016   CL 116 (H) 03/17/2016   CO2 28 04/11/2016   CO2 30 04/10/2016   CO2 26 03/17/2016   GLUCOSE 82 04/11/2016   GLUCOSE 128 (H) 04/10/2016   GLUCOSE 90 03/17/2016   BUN 12 04/11/2016   BUN 15 04/10/2016   BUN 14 03/17/2016   CREATININE 2.11 (H) 04/11/2016   CREATININE 2.17 (H) 04/10/2016   CREATININE 1.52 (H) 03/17/2016   CALCIUM 7.8 (L) 04/11/2016   CALCIUM 7.6 (L) 04/10/2016   CALCIUM 6.8 (L) 03/17/2016   LFT  Recent Labs  04/10/16 0808   PROT 4.6*  ALBUMIN 2.1*  AST 19  ALT 10  ALKPHOS 105  BILITOT 0.5

## 2016-04-11 NOTE — Progress Notes (Signed)
ANTICOAGULATION CONSULT NOTE   Pharmacy Consult for Lovneox Indication: DVT  No Known Allergies  Patient Measurements: Weight: 141 lb 15.6 oz (64.4 kg) Labs:  Recent Labs  04/10/16 0808 04/11/16 1113  HGB  --  10.4*  HCT  --  31.3*  PLT  --  297  CREATININE 2.17* 2.11*    Estimated Creatinine Clearance: 27.6 mL/min (by C-G formula based on SCr of 2.11 mg/dL (H)).   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 He had an admission in late November 2017 and again in December 2017 for GI bleed. GI cleared him during last hospitalization to resume anticoagulation, and Lovenox was resumed at discharge.  Reviewing PTA med list and outpatient notes, it was noted patient was possibly not taking Lovenox regularly.  I spoke with patient and he told me his wife is in charge of his medications (she was out of the room), but that he has not had the shot since he left the skilled facility on 04/01/2016.  Patient with AoCKD- current SCr 2.11, appears baseline is 1.5-1.6. CrCl <60mL/min. Hgb 10.4, plts 297- no bleeding noted.  Goal of Therapy:  Anti-Xa level 0.6-1 units/ml 4hrs after LMWH dose given Monitor platelets by anticoagulation protocol: Yes   Plan:  -Lovenox 60mg  subQ q24h (1mg /kg q24h for CrCl <50mL/min) -follow renal function, CBC, s/s bleeding -follow long term plan for anticoagulation- patient and family need education on anticoagulation and how to administer Lovenox  Chaka Jefferys D. Schylar Wuebker, PharmD, BCPS Clinical Pharmacist Pager: [phone] 04/11/2016 3:55 PM

## 2016-04-11 NOTE — ED Triage Notes (Signed)
Per EMS: pt from home recently discharged from rehab facility; pt sts generalized weakness and multiple falls; one today was assisted; pt noted to be orthostatic; pt with hx of hypokalemia and is on liquid diet

## 2016-04-12 ENCOUNTER — Observation Stay (HOSPITAL_COMMUNITY): Payer: Medicare Other

## 2016-04-12 ENCOUNTER — Other Ambulatory Visit: Payer: Self-pay | Admitting: *Deleted

## 2016-04-12 ENCOUNTER — Ambulatory Visit: Payer: Medicare Other | Admitting: Endocrinology

## 2016-04-12 DIAGNOSIS — N179 Acute kidney failure, unspecified: Secondary | ICD-10-CM | POA: Diagnosis present

## 2016-04-12 DIAGNOSIS — E860 Dehydration: Secondary | ICD-10-CM | POA: Diagnosis not present

## 2016-04-12 DIAGNOSIS — K222 Esophageal obstruction: Secondary | ICD-10-CM | POA: Diagnosis present

## 2016-04-12 DIAGNOSIS — R739 Hyperglycemia, unspecified: Secondary | ICD-10-CM | POA: Diagnosis not present

## 2016-04-12 DIAGNOSIS — Z833 Family history of diabetes mellitus: Secondary | ICD-10-CM | POA: Diagnosis not present

## 2016-04-12 DIAGNOSIS — E46 Unspecified protein-calorie malnutrition: Secondary | ICD-10-CM | POA: Diagnosis present

## 2016-04-12 DIAGNOSIS — Z8249 Family history of ischemic heart disease and other diseases of the circulatory system: Secondary | ICD-10-CM | POA: Diagnosis not present

## 2016-04-12 DIAGNOSIS — D649 Anemia, unspecified: Secondary | ICD-10-CM | POA: Diagnosis present

## 2016-04-12 DIAGNOSIS — I129 Hypertensive chronic kidney disease with stage 1 through stage 4 chronic kidney disease, or unspecified chronic kidney disease: Secondary | ICD-10-CM | POA: Diagnosis present

## 2016-04-12 DIAGNOSIS — I10 Essential (primary) hypertension: Secondary | ICD-10-CM | POA: Diagnosis not present

## 2016-04-12 DIAGNOSIS — Z7984 Long term (current) use of oral hypoglycemic drugs: Secondary | ICD-10-CM | POA: Diagnosis not present

## 2016-04-12 DIAGNOSIS — Z87891 Personal history of nicotine dependence: Secondary | ICD-10-CM | POA: Diagnosis not present

## 2016-04-12 DIAGNOSIS — E11649 Type 2 diabetes mellitus with hypoglycemia without coma: Secondary | ICD-10-CM | POA: Diagnosis present

## 2016-04-12 DIAGNOSIS — I82509 Chronic embolism and thrombosis of unspecified deep veins of unspecified lower extremity: Secondary | ICD-10-CM | POA: Diagnosis present

## 2016-04-12 DIAGNOSIS — Z8546 Personal history of malignant neoplasm of prostate: Secondary | ICD-10-CM | POA: Diagnosis not present

## 2016-04-12 DIAGNOSIS — N183 Chronic kidney disease, stage 3 (moderate): Secondary | ICD-10-CM | POA: Diagnosis present

## 2016-04-12 DIAGNOSIS — E785 Hyperlipidemia, unspecified: Secondary | ICD-10-CM | POA: Diagnosis present

## 2016-04-12 DIAGNOSIS — C61 Malignant neoplasm of prostate: Secondary | ICD-10-CM | POA: Diagnosis present

## 2016-04-12 DIAGNOSIS — R1310 Dysphagia, unspecified: Secondary | ICD-10-CM | POA: Diagnosis not present

## 2016-04-12 DIAGNOSIS — K220 Achalasia of cardia: Secondary | ICD-10-CM | POA: Diagnosis not present

## 2016-04-12 DIAGNOSIS — K209 Esophagitis, unspecified: Secondary | ICD-10-CM | POA: Diagnosis present

## 2016-04-12 DIAGNOSIS — I951 Orthostatic hypotension: Secondary | ICD-10-CM | POA: Diagnosis present

## 2016-04-12 DIAGNOSIS — R55 Syncope and collapse: Secondary | ICD-10-CM | POA: Diagnosis not present

## 2016-04-12 DIAGNOSIS — Z6821 Body mass index (BMI) 21.0-21.9, adult: Secondary | ICD-10-CM | POA: Diagnosis not present

## 2016-04-12 DIAGNOSIS — E1122 Type 2 diabetes mellitus with diabetic chronic kidney disease: Secondary | ICD-10-CM | POA: Diagnosis present

## 2016-04-12 DIAGNOSIS — E876 Hypokalemia: Secondary | ICD-10-CM | POA: Diagnosis present

## 2016-04-12 DIAGNOSIS — Z7901 Long term (current) use of anticoagulants: Secondary | ICD-10-CM | POA: Diagnosis not present

## 2016-04-12 DIAGNOSIS — Z9119 Patient's noncompliance with other medical treatment and regimen: Secondary | ICD-10-CM | POA: Diagnosis not present

## 2016-04-12 LAB — COMPREHENSIVE METABOLIC PANEL
ALBUMIN: 1.4 g/dL — AB (ref 3.5–5.0)
ALK PHOS: 89 U/L (ref 38–126)
ALT: 9 U/L — ABNORMAL LOW (ref 17–63)
ANION GAP: 7 (ref 5–15)
AST: 21 U/L (ref 15–41)
BUN: 13 mg/dL (ref 6–20)
CALCIUM: 7.1 mg/dL — AB (ref 8.9–10.3)
CO2: 25 mmol/L (ref 22–32)
Chloride: 110 mmol/L (ref 101–111)
Creatinine, Ser: 1.95 mg/dL — ABNORMAL HIGH (ref 0.61–1.24)
GFR calc non Af Amer: 32 mL/min — ABNORMAL LOW (ref >60)
GFR, EST AFRICAN AMERICAN: 37 mL/min — AB (ref >60)
GLUCOSE: 108 mg/dL — AB (ref 65–99)
POTASSIUM: 3.6 mmol/L (ref 3.5–5.1)
SODIUM: 142 mmol/L (ref 135–145)
Total Bilirubin: 0.6 mg/dL (ref 0.3–1.2)
Total Protein: 4 g/dL — ABNORMAL LOW (ref 6.5–8.1)

## 2016-04-12 LAB — CBC
HEMATOCRIT: 26.1 % — AB (ref 39.0–52.0)
HEMOGLOBIN: 8.7 g/dL — AB (ref 13.0–17.0)
MCH: 29.2 pg (ref 26.0–34.0)
MCHC: 33.3 g/dL (ref 30.0–36.0)
MCV: 87.6 fL (ref 78.0–100.0)
Platelets: 228 10*3/uL (ref 150–400)
RBC: 2.98 MIL/uL — AB (ref 4.22–5.81)
RDW: 16.8 % — ABNORMAL HIGH (ref 11.5–15.5)
WBC: 12 10*3/uL — ABNORMAL HIGH (ref 4.0–10.5)

## 2016-04-12 LAB — GLUCOSE, CAPILLARY
GLUCOSE-CAPILLARY: 78 mg/dL (ref 65–99)
GLUCOSE-CAPILLARY: 61 mg/dL — AB (ref 65–99)
GLUCOSE-CAPILLARY: 67 mg/dL (ref 65–99)
GLUCOSE-CAPILLARY: 96 mg/dL (ref 65–99)
GLUCOSE-CAPILLARY: 155 mg/dL — AB (ref 65–99)
Glucose-Capillary: 54 mg/dL — ABNORMAL LOW (ref 65–99)
Glucose-Capillary: 120 mg/dL — ABNORMAL HIGH (ref 65–99)

## 2016-04-12 MED ORDER — ENOXAPARIN SODIUM 60 MG/0.6ML ~~LOC~~ SOLN: 60.0000 mg | Freq: Two times a day (BID) | SUBCUTANEOUS | Status: DC

## 2016-04-12 MED ORDER — IOPAMIDOL (ISOVUE-300) INJECTION 61%
150.0000 mL | Freq: Once | INTRAVENOUS | Status: AC | PRN
  Administered 2016-04-12: 100 mL via ORAL

## 2016-04-12 MED ORDER — DEXTROSE-NACL 5-0.9 % IV SOLN
INTRAVENOUS | Status: DC
  Administered 2016-04-12: 13:00:00 via INTRAVENOUS

## 2016-04-12 MED ORDER — IOPAMIDOL (ISOVUE-300) INJECTION 61%
INTRAVENOUS | Status: AC
  Filled 2016-04-12: qty 150

## 2016-04-12 NOTE — Evaluation (Signed)
Physical Therapy Evaluation Patient Details Name: [name] MRN: [id number] DOB: January 17, 1941 Today's Date: 04/12/2016   History of Present Illness   [name] is a 76 y.o. 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 EMS found him to be orthostatic and hypoglycemic with glucose 64 mg/dL.  Clinical Impression  Pt admitted with/for fall with orthostasis and hypoglycemia..  Pt currently limited functionally due to the problems listed below.  (see problems list.)  Pt will benefit from PT to maximize function and safety to be able to get home safely with available assist of family.     Follow Up Recommendations Home health PT;Other (comment) (up to 24 hour supervision/assist)    Equipment Recommendations  None recommended by PT;Other (comment) (TBA by HHPT)    Recommendations for Other Services       Precautions / Restrictions Precautions Precautions: Fall      Mobility  Bed Mobility Overal bed mobility: Needs Assistance Bed Mobility: Supine to Sit;Sit to Supine     Supine to sit: Supervision Sit to supine: Supervision   General bed mobility comments: extra time and mild struggle at points in the transition   Transfers Overall transfer level: Needs assistance   Transfers: Sit to/from Stand Sit to Stand: Min guard;Min assist         General transfer comment: lower surface needs assist to come forward and come up.  Ambulation/Gait Ambulation/Gait assistance: Min guard;Min assist Ambulation Distance (Feet): 200 Feet Assistive device:  (iv pole) Gait Pattern/deviations: Step-through pattern   Gait velocity interpretation: Below normal speed for age/gender General Gait Details: mildly weak  gait overall, but manage with very little stability assist  Stairs            Wheelchair Mobility    Modified Rankin (Stroke Patients Only)       Balance Overall balance assessment: Needs assistance Sitting-balance support: No upper extremity supported Sitting balance-Leahy Scale: Fair     Standing balance support: Single extremity supported Standing balance-Leahy Scale: Poor Standing balance comment: safest with at least one hand on AD or stationary object.                             Pertinent Vitals/Pain Pain Assessment: No/denies pain    Home Living Family/patient expects to be discharged to:: Private residence Living Arrangements: Spouse/significant other Available Help at Discharge: Family;Available PRN/intermittently Type of Home: House Home Access: Stairs to enter Entrance Stairs-Rails: None Entrance Stairs-Number of Steps: 2 Home Layout: One level Home Equipment: Cane - single point Additional Comments: pt's brother and sister report pt's spouse is home most of the time but is unable to physically assist pt    Prior Function Level of Independence: Needs assistance               Hand Dominance        Extremity/Trunk Assessment        Lower Extremity Assessment Lower Extremity Assessment: Generalized weakness;Overall WFL for tasks assessed       Communication      Cognition Arousal/Alertness: Awake/alert Behavior During Therapy: WFL for tasks assessed/performed Overall Cognitive Status: Within Functional Limits for tasks assessed  General Comments      Exercises     Assessment/Plan    PT Assessment Patient needs continued PT services  PT Problem List Decreased strength;Decreased activity tolerance;Decreased balance;Decreased mobility;Decreased knowledge of use of DME          PT Treatment Interventions DME instruction;Gait training;Functional mobility training;Therapeutic  activities;Balance training;Patient/family education    PT Goals (Current goals can be found in the Care Plan section)  Acute Rehab PT Goals Patient Stated Goal: go home.Marland KitchenMarland KitchenMarland KitchenI think PT would be good. PT Goal Formulation: With patient Time For Goal Achievement: 04/26/16 Potential to Achieve Goals: Good    Frequency Min 3X/week   Barriers to discharge   wife at home most of the time, but will not be help with asist    Co-evaluation               End of Session   Activity Tolerance: Patient tolerated treatment well Patient left: in bed;with call bell/phone within reach;with bed alarm set Nurse Communication: Mobility status    Functional Assessment Tool Used: clinical judgement Functional Limitation: Mobility: Walking and moving around Mobility: Walking and Moving Around Current Status [id number]): At least 1 percent but less than 20 percent impaired, limited or restricted Mobility: Walking and Moving Around Goal Status [phone]): At least 1 percent but less than 20 percent impaired, limited or restricted    Time: 1435-1514 PT Time Calculation (min) (ACUTE ONLY): 39 min   Charges:   PT Evaluation $PT Eval Moderate Complexity: 1 Procedure PT Treatments $Gait Training: 8-22 mins $Therapeutic Activity: 8-22 mins   PT G Codes:   PT G-Codes **NOT FOR INPATIENT CLASS** Functional Assessment Tool Used: clinical judgement Functional Limitation: Mobility: Walking and moving around Mobility: Walking and Moving Around Current Status [id number]): At least 1 percent but less than 20 percent impaired, limited or restricted Mobility: Walking and Moving Around Goal Status [phone]): At least 1 percent but less than 20 percent impaired, limited or restricted    Burnard Bunting 04/12/2016, 3:36 PM 04/12/2016  Donnella Sham, PT [phone]  (pager)

## 2016-04-12 NOTE — Progress Notes (Signed)
New Admission Note:  Arrival Method: Stretcher Mental Orientation: Alert and oriented x 4 Telemetry: Box 07 Assessment: Completed Skin: Warm and dry  IV: NSL Pain: WNL Tubes: N/A  Safety Measures: Safety Fall Prevention Plan was given, discussed and signed. Admission: Completed [facility] [address] Orientation: Patient has been orientated to the room, unit and the staff. Family: None   Orders have been reviewed and implemented. Will continue to monitor the patient. Call light has been placed within reach and bed alarm has been activated.   Sima Matas BSN, RN  Phone Number: [phone]

## 2016-04-12 NOTE — Consult Note (Signed)
   [facility] CM Inpatient Consult   04/12/2016  [name] Dec 03, 1940 [id number]    Made aware of hospitalization by [facility] Licensed CSW who has been attempting to engage [name] during his recent stay at [facility]. Requested that writer follow up with [name] at bedside to discuss engaging with [place] [facility] program. Martin Majestic to bedside and spoke with [name] and son, [name] about [place] [facility] program services. [name] states, " I already have [facility], not sure why I need any more services". Writer explained in detail that [place] [facility] will provide an additional level of support. [name] and son asked writer to contact patient's wife to discuss [facility] Care Management program further.  Writer called Mrs. Larkin per patient's request to discuss and explain [facility] Care Management. Mrs. Gladbach states " he already has so much going on but it sounds like it will be a good program for him". She also states "it is his decision though". Writer explained to Mrs. Kamradt that patient deferred the decision to her. With Mrs. Drema Dallas request, Probation officer went back to patient to ask for his decision. [name] states " I do not need any other services right now". Explained to him that he can contact us when and if he changes his mind. Provided contact information and brochure to patient's son, [name].  Will make [place] team aware that [name] is not interested in [facility] at this time. States he will call if he changes his mind. Also note he would not sign consent or give verbal consent for further [place] follow up. Made inpatient team aware of all of the above and that [name] declined [place] [facility] follow up at this time.   Marthenia Rolling, MSN-Ed, RN,BSN [facility] Liaison [phone]

## 2016-04-12 NOTE — Progress Notes (Signed)
PROGRESS NOTE    [name]  [id number] DOB: Apr 13, 1940 DOA: 04/11/2016 PCP: Pcp Not In System     Brief Narrative:  [name] is a 76 y.o. male with medical history significant of DM, esophageal achalasia, prostate CA, CKD, chronic DVT (diagnosed in October 2017) on Lovenox anticoagulation therapy who was recently hospitalized with acute blood loss anemia secondary to GI bleed who presented after an assisted fall at home. Patient states that due to his achalasia that was diagnosed numerous years ago, he is unable to have adequate oral intake. EMS found to be orthostatic as well as hypoglycemic with blood glucose of 64.  Assessment & Plan:   Principal Problem:   Dehydration Active Problems:   Diabetes mellitus type 2 in nonobese [facility])   Essential hypertension   Chronic deep vein thrombosis (DVT) (HCC)   Hypokalemia   Acute kidney injury superimposed on chronic kidney disease (HCC)   Orthostatic hypotension  Near syncopal episode -Likely secondary to dehydration, hypoglycemia, poor oral intake secondary to achalasia -Orthostatic vital signs  Esophageal achalasia -Last LES botox injection 02/28/2016 -Water-soluble esophogram shows distal esophageal obstruction with no emptying into stomach  -May require EGD, balloon dilation, or other. GI following  AKI on CKD stage 3 -Due to poor oral intake and dehydration -Baseline creatinine appears to be around 1.5-1.6 -Improving with IV fluids  -Trend BMP  Essential HTN -Continue tenormin   HLD -Continue lipitor   Chronic right lower leg DVT -Lovenox  DM type 2 -Hold oral hypoglycemics -SSI  Hx prostate cancer -Continue zytiga, cortef   DVT prophylaxis: lovenox Code Status: full Family Communication: no family at bedside Disposition Plan: pending further improvement   Consultants:   GI  Procedures:   None  Antimicrobials:   None     Subjective: His main complaint this morning is his  frustration that he remains nothing by mouth. He has no other new complaints today.  Objective: Vitals:   04/11/16 1930 04/11/16 2034 04/12/16 0438 04/12/16 0856  BP: 135/69 139/60 110/63 (!) 110/58  Pulse: 71 80 70 71  Resp: 19 18 17 16   Temp:  98.4 F (36.9 C) 99 F (37.2 C) 98.9 F (37.2 C)  TempSrc:  Oral Oral Oral  SpO2: 99% 95% 99% 99%  Weight:  67.3 kg (148 lb 5.8 oz)    Height:  5\' 8"  (1.727 m)      Intake/Output Summary (Last 24 hours) at 04/12/16 1159 Last data filed at 04/12/16 0600  Gross per 24 hour  Intake           936.25 ml  Output                0 ml  Net           936.25 ml   Filed Weights   04/11/16 1500 04/11/16 2034  Weight: 64.4 kg (141 lb 15.6 oz) 67.3 kg (148 lb 5.8 oz)    Examination:  General exam: Appears calm and comfortable  Respiratory system: Clear to auscultation. Respiratory effort normal. Cardiovascular system: S1 & S2 heard, RRR. No JVD, murmurs, rubs, gallops or clicks. No pedal edema. Gastrointestinal system: Abdomen is nondistended, soft and nontender. No organomegaly or masses felt. Normal bowel sounds heard. Central nervous system: Alert and oriented. No focal neurological deficits. Extremities: Symmetric 5 x 5 power. Skin: No rashes, lesions or ulcers Psychiatry: Judgement and insight appear normal. Mood & affect appropriate.   Data Reviewed: I have personally reviewed following labs  and imaging studies  CBC:  Recent Labs Lab 04/11/16 1113 04/12/16 0537  WBC 11.8* 12.0*  NEUTROABS 10.0*  --   HGB 10.4* 8.7*  HCT 31.3* 26.1*  MCV 87.2 87.6  PLT 297 [id number]   Basic Metabolic Panel:  Recent Labs Lab 04/10/16 0808 04/11/16 1113 04/11/16 1634 04/12/16 0537  NA 144 142  --  142  K 3.5 2.9*  --  3.6  CL 106 106  --  110  CO2 30 28  --  25  GLUCOSE 128* 82  --  108*  BUN 15 12  --  13  CREATININE 2.17* 2.11*  --  1.95*  CALCIUM 7.6* 7.8*  --  7.1*  MG  --   --  1.3*  --    GFR: Estimated Creatinine Clearance: 31.2  mL/min (by C-G formula based on SCr of 1.95 mg/dL (H)). Liver Function Tests:  Recent Labs Lab 04/10/16 0808 04/12/16 0537  AST 19 21  ALT 10 9*  ALKPHOS 105 89  BILITOT 0.5 0.6  PROT 4.6* 4.0*  ALBUMIN 2.1* 1.4*   No results for input(s): LIPASE, AMYLASE in the last 168 hours. No results for input(s): AMMONIA in the last 168 hours. Coagulation Profile: No results for input(s): INR, PROTIME in the last 168 hours. Cardiac Enzymes: No results for input(s): CKTOTAL, CKMB, CKMBINDEX, TROPONINI in the last 168 hours. BNP (last 3 results) No results for input(s): PROBNP in the last 8760 hours. HbA1C:  Recent Labs  04/10/16 0808  HGBA1C 5.9   CBG:  Recent Labs Lab 04/11/16 2039 04/12/16 0756 04/12/16 1130 04/12/16 1145 04/12/16 1157  GLUCAP 111* 78 67 54* 61*   Lipid Profile: No results for input(s): CHOL, HDL, LDLCALC, TRIG, CHOLHDL, LDLDIRECT in the last 72 hours. Thyroid Function Tests: No results for input(s): TSH, T4TOTAL, FREET4, T3FREE, THYROIDAB in the last 72 hours. Anemia Panel: No results for input(s): VITAMINB12, FOLATE, FERRITIN, TIBC, IRON, RETICCTPCT in the last 72 hours. Sepsis Labs: No results for input(s): PROCALCITON, LATICACIDVEN in the last 168 hours.  No results found for this or any previous visit (from the past 240 hour(s)).     Radiology Studies: Dg Esophagus  Result Date: 04/12/2016 CLINICAL DATA:  The patient has a known history of achalasia with difficulty swallowing. The study was ordered with water soluble contrast to assess for esophageal emptying. The study was to be converted to a barium study if esophageal emptying occurred. EXAM: ESOPHOGRAM/BARIUM SWALLOW TECHNIQUE: Single contrast examination was performed using water-soluble contrast. FLUOROSCOPY TIME:  Fluoroscopy Time:  30 seconds Radiation Exposure Index (if provided by the fluoroscopic device): Number of Acquired Spot Images: 2 COMPARISON:  None. FINDINGS: The patient swallowed  several mouthfuls of water-soluble contrast. The contrast pooled in the distal esophagus with esophageal distention and no emptying into the stomach. No further imaging was obtained due to lack of emptying into the stomach. IMPRESSION: Distal esophageal obstruction with no emptying into the stomach. The findings are likely due to the patient's reported history of achalasia. Electronically Signed   By: Dorise Bullion III M.D   On: 04/12/2016 08:46      Scheduled Meds: . abiraterone Acetate  1,000 mg Oral Daily  . acetaminophen  650 mg Oral Q8H  . atenolol  25 mg Oral Daily  . atorvastatin  10 mg Oral QPC supper  . docusate sodium  100 mg Oral BID  . enoxaparin  60 mg Subcutaneous Q12H  . feeding supplement (GLUCERNA SHAKE)  237 mL Oral  BID BM  . ferrous sulfate  325 mg Oral Daily  . hydrocortisone  10 mg Oral BID  . insulin aspart  0-9 Units Subcutaneous TID WC  . iopamidol      . pantoprazole  40 mg Oral BID  . polyethylene glycol  17 g Oral QODAY   Continuous Infusions: . dextrose 5 % and 0.9% NaCl       LOS: 0 days    Time spent: 40 minutes   Dessa Phi, DO [facility] [email or web address] Password [email or web address] 04/12/2016, 11:59 AM

## 2016-04-12 NOTE — Care Management Obs Status (Signed)
[place] NOTIFICATION   Patient Details  Name: [name] MRN: [id number] Date of Birth: [birth year]   Medicare Observation Status Notification Given:  Yes    Ayza Ripoll, Rory Percy, RN 04/12/2016, 1:30 PM

## 2016-04-12 NOTE — Progress Notes (Signed)
Daily Rounding Note   04/12/2016, 1:07 PM  ASSESMENT:   *  Regurgitation in patient with achalasia. Had cheated and had some solid food after returning home 12/24, was supposed to take only liquids.  Currently tolerating clears. Possible gastroparesis (retained gastric contents on EGD of 02/28/16) which may be contributing as well.  Esophagram today shows obstruction at distal esophagus, no contrast entered stomach.   *  AKI. Creatinine improved.   *  Prostate cancer. On Zytiga.  Last oncology office visit 01/25/16.   *  Normocytic anemia.  Hgb down 10.4 >> 8.7, exactly where it was on 03/15/16.     *  DVT.  Diagnosed late 01/2016, on Lovenox.   *  Malnutrition      PLAN   *  EGD with balloon dilatation and possible botox injection tomorrow 04/13/16, time TBD.    *  Stop Lovenox, resume after EGD.  PAS hose in place.      [name]  04/12/2016, 1:07 PM Pager: [phone]  LOS: 0 days   Agree with Ms. Sandi Carne assessment and plan. Gatha Mayer, MD, [facility]  SUBJECTIVE:   Chief complaint: regurgitation.  This improved, only one incident last night.  On clears     OBJECTIVE:         Vital signs in last 24 hours:    Temp:  [98.4 F (36.9 C)-99 F (37.2 C)] 98.9 F (37.2 C) (01/04 0856) Pulse Rate:  [70-80] 71 (01/04 0856) Resp:  [16-24] 16 (01/04 0856) BP: (110-139)/(51-84) 110/58 (01/04 0856) SpO2:  [95 %-100 %] 99 % (01/04 0856) Weight:  [64.4 kg (141 lb 15.6 oz)-67.3 kg (148 lb 5.8 oz)] 67.3 kg (148 lb 5.8 oz) (01/03 2034) Last BM Date: 04/11/16 Filed Weights   04/11/16 1500 04/11/16 2034  Weight: 64.4 kg (141 lb 15.6 oz) 67.3 kg (148 lb 5.8 oz)   General: frail, comfortable.  No acutely ill looking   Heart: RRR Chest: clear bil.  No dyspnea Abdomen: soft, NT, active BS, ND.    Extremities: no CCE Neuro/Psych:  Alert, appropriate.    Intake/Output from previous day: 01/03 0701 - 01/04  0700 In: 936.3 [P.O.:240; I.V.:696.3] Out: 0   Intake/Output this shift: No intake/output data recorded.  Lab Results:  Recent Labs  04/11/16 1113 04/12/16 0537  WBC 11.8* 12.0*  HGB 10.4* 8.7*  HCT 31.3* 26.1*  PLT 297 228   BMET  Recent Labs  04/10/16 0808 04/11/16 1113 04/12/16 0537  NA 144 142 142  K 3.5 2.9* 3.6  CL 106 106 110  CO2 30 28 25   GLUCOSE 128* 82 108*  BUN 15 12 13   CREATININE 2.17* 2.11* 1.95*  CALCIUM 7.6* 7.8* 7.1*   LFT  Recent Labs  04/10/16 0808 04/12/16 0537  PROT 4.6* 4.0*  ALBUMIN 2.1* 1.4*  AST 19 21  ALT 10 9*  ALKPHOS 105 89  BILITOT 0.5 0.6   PT/INR No results for input(s): LABPROT, INR in the last 72 hours. Hepatitis Panel No results for input(s): HEPBSAG, HCVAB, HEPAIGM, HEPBIGM in the last 72 hours.  Studies/Results: Dg Esophagus  Result Date: 04/12/2016 CLINICAL DATA:  The patient has a known history of achalasia with difficulty swallowing. The study was ordered with water soluble contrast to assess for esophageal emptying. The study was to be converted to a barium study if esophageal emptying occurred. EXAM: ESOPHOGRAM/BARIUM SWALLOW TECHNIQUE: Single contrast examination was performed using water-soluble contrast. FLUOROSCOPY TIME:  Fluoroscopy Time:  30 seconds Radiation Exposure Index (if provided by the fluoroscopic device): Number of Acquired Spot Images: 2 COMPARISON:  None. FINDINGS: The patient swallowed several mouthfuls of water-soluble contrast. The contrast pooled in the distal esophagus with esophageal distention and no emptying into the stomach. No further imaging was obtained due to lack of emptying into the stomach. IMPRESSION: Distal esophageal obstruction with no emptying into the stomach. The findings are likely due to the patient's reported history of achalasia. Electronically Signed   By: Dorise Bullion III M.D   On: 04/12/2016 08:46

## 2016-04-12 NOTE — Care Management Note (Signed)
Case Management Note  Patient Details  Name: [name] MRN: [id number] Date of Birth: [birth year]  Subjective/Objective:      CM following for progression and d/c planning. Recent d/c from [facility] on 04/01/2016, now with fall at home.               Action/Plan: 04/12/2016 Met with pt and son, pt very agitated re wanting to drink report very thirsty and dry. Pt with long explanation of ongoing health issues over the years and how he simply fell due to his sugar dropping and that this has been going on for years. Dr Maylene Roes notified of pt agitation and this CM requested that she please consider liquids for this pt to control his agitation. Pt states that he is active with [facility] for [facility] services. Linus Mako of [facility] notified of admission.  Expected Discharge Date:  04/13/16               Expected Discharge Plan:  [place]  In-House Referral:     Discharge planning Services  CM Consult  Post Acute Care Choice:  Home Health Choice offered to:  Patient  DME Arranged:  N/A DME Agency:  NA  HH Arranged:  RN, PT [place] Agency:  [place] (now [facility])  Status of Service:  In process, will continue to follow  If discussed at Long Length of Stay Meetings, dates discussed:    Additional Comments:  Adron Bene, RN 04/12/2016, 10:36 AM

## 2016-04-12 NOTE — Patient Outreach (Signed)
[facility]) Care Management  04/12/2016  [name] [birth year] [id number]   [facility] CSW has attempted to enroll patient under [facility] CM while at [facility] SNF; however, patient equested that I speak with his son for consent. Son has indicated patient was released from SNF to home but taken to hospital yesterday where he was admitted.  [facility] CSW has advised [facility] and will await further dc plan/disposition.   Eduard Clos, MSW, [place] Worker  [place] [phone]

## 2016-04-13 ENCOUNTER — Inpatient Hospital Stay (HOSPITAL_COMMUNITY): Payer: Medicare Other | Admitting: Anesthesiology

## 2016-04-13 ENCOUNTER — Encounter (HOSPITAL_COMMUNITY): Payer: Self-pay

## 2016-04-13 ENCOUNTER — Encounter (HOSPITAL_COMMUNITY)
Admission: EM | Disposition: A | Discharge status: Home / Self Care | Payer: Self-pay | Source: Home / Self Care | Attending: Internal Medicine

## 2016-04-13 HISTORY — PX: BALLOON DILATION: SHX5330

## 2016-04-13 HISTORY — PX: BOTOX INJECTION: SHX5754

## 2016-04-13 HISTORY — PX: ESOPHAGOGASTRODUODENOSCOPY (EGD) WITH PROPOFOL: SHX5813

## 2016-04-13 LAB — BASIC METABOLIC PANEL
ANION GAP: 9 (ref 5–15)
BUN: 13 mg/dL (ref 6–20)
CHLORIDE: 110 mmol/L (ref 101–111)
CO2: 22 mmol/L (ref 22–32)
Calcium: 7.5 mg/dL — ABNORMAL LOW (ref 8.9–10.3)
Creatinine, Ser: 1.84 mg/dL — ABNORMAL HIGH (ref 0.61–1.24)
GFR, EST AFRICAN AMERICAN: 40 mL/min — AB (ref >60)
GFR, EST NON AFRICAN AMERICAN: 34 mL/min — AB (ref >60)
Glucose, Bld: 106 mg/dL — ABNORMAL HIGH (ref 65–99)
POTASSIUM: 3.8 mmol/L (ref 3.5–5.1)
SODIUM: 141 mmol/L (ref 135–145)

## 2016-04-13 LAB — GLUCOSE, CAPILLARY
GLUCOSE-CAPILLARY: 108 mg/dL — AB (ref 65–99)
Glucose-Capillary: 88 mg/dL (ref 65–99)

## 2016-04-13 SURGERY — ESOPHAGOGASTRODUODENOSCOPY (EGD) WITH PROPOFOL
Anesthesia: General

## 2016-04-13 MED ORDER — ONDANSETRON HCL 4 MG/2ML IJ SOLN
INTRAMUSCULAR | Status: DC | PRN
  Administered 2016-04-13: 4 mg via INTRAVENOUS

## 2016-04-13 MED ORDER — LIDOCAINE HCL (CARDIAC) 20 MG/ML IV SOLN
INTRAVENOUS | Status: DC | PRN
  Administered 2016-04-13: 80 mg via INTRAVENOUS

## 2016-04-13 MED ORDER — ESMOLOL HCL 100 MG/10ML IV SOLN
INTRAVENOUS | Status: DC | PRN
  Administered 2016-04-13: 10 mg via INTRAVENOUS

## 2016-04-13 MED ORDER — PROPOFOL 10 MG/ML IV BOLUS
INTRAVENOUS | Status: DC | PRN
  Administered 2016-04-13: 150 mg via INTRAVENOUS

## 2016-04-13 MED ORDER — SUCCINYLCHOLINE CHLORIDE 20 MG/ML IJ SOLN
INTRAMUSCULAR | Status: DC | PRN
  Administered 2016-04-13: 100 mg via INTRAVENOUS

## 2016-04-13 MED ORDER — SODIUM CHLORIDE 0.9 % IV SOLN
INTRAVENOUS | Status: DC
  Administered 2016-04-13: 08:00:00 via INTRAVENOUS

## 2016-04-13 MED ORDER — ENOXAPARIN SODIUM 60 MG/0.6ML ~~LOC~~ SOLN: 60.0000 mg | Freq: Two times a day (BID) | SUBCUTANEOUS | Status: DC

## 2016-04-13 MED ORDER — PHENYLEPHRINE HCL 10 MG/ML IJ SOLN
INTRAVENOUS | Status: DC | PRN
  Administered 2016-04-13: 50 ug/min via INTRAVENOUS

## 2016-04-13 MED ORDER — PRO-STAT SUGAR FREE PO LIQD: 30.0000 mL | Freq: Two times a day (BID) | ORAL | Status: DC

## 2016-04-13 MED ORDER — ENOXAPARIN SODIUM 60 MG/0.6ML ~~LOC~~ SOLN
60.0000 mg | Freq: Two times a day (BID) | SUBCUTANEOUS | Status: DC
  Filled 2016-04-13: qty 0.6

## 2016-04-13 MED ORDER — SODIUM CHLORIDE 0.9 % IJ SOLN
INTRAMUSCULAR | Status: DC | PRN
  Administered 2016-04-13: 4 mL via SUBMUCOSAL

## 2016-04-13 MED ORDER — PRO-STAT SUGAR FREE PO LIQD: 30.0000 mL | Freq: Two times a day (BID) | ORAL | 0 refills | Status: AC

## 2016-04-13 MED ORDER — SODIUM CHLORIDE 0.9 % IJ SOLN
100.0000 [IU] | INTRAMUSCULAR | Status: DC
  Filled 2016-04-13: qty 100

## 2016-04-13 MED ORDER — SODIUM CHLORIDE 0.9 % IJ SOLN
INTRAMUSCULAR | Status: AC
  Filled 2016-04-13: qty 10

## 2016-04-13 NOTE — Anesthesia Preprocedure Evaluation (Addendum)
Anesthesia Evaluation  Patient identified by MRN, date of birth, ID band Patient awake    Reviewed: Allergy & Precautions, NPO status , Patient's Chart, lab work & pertinent test results, reviewed documented beta blocker date and time   Airway Mallampati: II  TM Distance: >3 FB Neck ROM: Full    Dental  (+) Teeth Intact, Edentulous Upper, Dental Advisory Given, Missing,    Pulmonary former smoker,    Pulmonary exam normal breath sounds clear to auscultation       Cardiovascular hypertension, Pt. on home beta blockers Normal cardiovascular exam Rhythm:Regular Rate:Normal     Neuro/Psych negative neurological ROS  negative psych ROS   GI/Hepatic GERD  Medicated,Distal esophogeal obstruction   Endo/Other  diabetes, Type 2, Oral Hypoglycemic Agents  Renal/GU Renal InsufficiencyRenal disease     Musculoskeletal   Abdominal   Peds  Hematology   Anesthesia Other Findings   Reproductive/Obstetrics                            Anesthesia Physical Anesthesia Plan  ASA: III  Anesthesia Plan: General   Post-op Pain Management:    Induction: Intravenous, Rapid sequence and Cricoid pressure planned  Airway Management Planned: Oral ETT  Additional Equipment:   Intra-op Plan:   Post-operative Plan: Extubation in OR  Informed Consent: I have reviewed the patients History and Physical, chart, labs and discussed the procedure including the risks, benefits and alternatives for the proposed anesthesia with the patient or authorized representative who has indicated his/her understanding and acceptance.   Dental advisory given  Plan Discussed with: CRNA  Anesthesia Plan Comments: (Risks/benefits of general anesthesia discussed with patient including risk of damage to teeth, lips, gum, and tongue, nausea/vomiting, allergic reactions to medications, and the possibility of heart attack, stroke and death.   All patient questions answered.  Patient wishes to proceed.)        Anesthesia Quick Evaluation

## 2016-04-13 NOTE — Anesthesia Procedure Notes (Signed)
Procedure Name: Intubation Date/Time: 04/13/2016 8:21 AM Performed by: Suzy Bouchard Pre-anesthesia Checklist: Patient identified, Emergency Drugs available, Suction available, Patient being monitored and Timeout performed Patient Re-evaluated:Patient Re-evaluated prior to inductionOxygen Delivery Method: Circle system utilized Preoxygenation: Pre-oxygenation with 100% oxygen Intubation Type: IV induction, Rapid sequence and Cricoid Pressure applied Laryngoscope Size: Miller and 2 Grade View: Grade I Tube type: Oral Tube size: 7.5 mm Number of attempts: 1 Airway Equipment and Method: Stylet Placement Confirmation: ETT inserted through vocal cords under direct vision,  positive ETCO2 and breath sounds checked- equal and bilateral Secured at: 23 cm Tube secured with: Tape Dental Injury: Teeth and Oropharynx as per pre-operative assessment

## 2016-04-13 NOTE — Op Note (Signed)
[facility] Patient Name: [name] Procedure Date : 04/13/2016 MRN: [id number] Attending MD: Gatha Mayer , MD Date of Birth: [birth year] CSN: [id number] Age: 76 Admit Type: Inpatient Procedure:                Upper GI endoscopy Indications:              Therapeutic procedure, Dysphagia, Achalasia, For                            management of achalasia, For therapy of achalasia,                            For botulinum toxin injection of achalasia Providers:                Gatha Mayer, MD, Carolynn Comment, RN, Alfonso Patten, Technician, Ivar Drape CRNA, CRNA Referring MD:              Medicines:                General Anesthesia Complications:            No immediate complications. Estimated Blood Loss:     Estimated blood loss was minimal. Procedure:                Pre-Anesthesia Assessment:                           - Prior to the procedure, a History and Physical                            was performed, and patient medications and                            allergies were reviewed. The patient's tolerance of                            previous anesthesia was also reviewed. The risks                            and benefits of the procedure and the sedation                            options and risks were discussed with the patient.                            All questions were answered, and informed consent                            was obtained. Prior Anticoagulants: The patient                            last took Lovenox (enoxaparin) 1 day prior to the  procedure. ASA Grade Assessment: III - A patient                            with severe systemic disease. After reviewing the                            risks and benefits, the patient was deemed in                            satisfactory condition to undergo the procedure.                           After obtaining informed consent, the endoscope was                             passed under direct vision. Throughout the                            procedure, the patient's blood pressure, pulse, and                            oxygen saturations were monitored continuously. The                            [id number] scope was introduced through the                            mouth, and advanced to the second part of duodenum.                            The upper GI endoscopy was accomplished without                            difficulty. The patient tolerated the procedure                            well. Scope In: Scope Out: Findings:      The lumen of the esophagus was severely dilated.      Fluid + a few pieces of food and pills were found in the middle third of       the esophagus.      Esophagitis with no bleeding was found in the entire esophagus. Mildly       nodular and slightly friable - from stasis      One mild benign-appearing, intrinsic stenosis was found at the       gastroesophageal junction. And was traversed. A TTS dilator was passed       through the scope. Dilation with an 18-19-20 mm balloon dilator was       performed to 20 mm. The dilation site was examined and showed mild       improvement in luminal narrowing. Estimated blood loss was minimal. Area       was successfully injected with 100 units botulinum toxin.      The entire examined stomach was normal.      The examined duodenum was  normal.      The cardia and gastric fundus were normal on retroflexion. Impression:               - Dilation in the entire esophagus.                           - Fluid, food, pills in the middle third of the                            esophagus.                           - Chronic esophagitis. Stasis                           - Benign-appearing esophageal stenosis. Slight.                            Dilated. Injected with botulinum toxin.                           - Normal stomach.                           - Normal  examined duodenum.                           - No specimens collected. Moderate Sedation:      Please see anesthesia notes, moderate sedation not given Recommendation:           - Return patient to hospital ward for ongoing care.                           -                           Will see if this helps - esophagus seems flaccid                            and the GE junction stenosis not that tight. Does                            not seem like he would be a good candidate for                            large balloon dilation/myotomy - may need a PEG.                            will discuss.                           He could potentially go home later today vs                            tomorrow - will need outpatient GI f/u which we can  arrange.                           Strict compliance with liquid diet and taking                            nutritional supplements necessary.                           Will consult dietitian if not yet done.                           - Resume Lovenox (enoxaparin) at prior dose today.                           - Clear liquid diet - advance as tolerated to full                            liquids maximum indefinitely. Procedure Code(s):        --- Professional ---                           [phone], Esophagogastroduodenoscopy, flexible,                            transoral; with transendoscopic balloon dilation of                            esophagus (less than 30 mm diameter) Diagnosis Code(s):        --- Professional ---                           K22.8, Other specified diseases of esophagus                           K20.9, Esophagitis, unspecified                           K22.2, Esophageal obstruction                           R13.10, Dysphagia, unspecified                           K22.0, Achalasia of cardia CPT copyright 2016 [employer]. All rights reserved. The codes documented in this report are  preliminary and upon coder review may  be revised to meet current compliance requirements. Gatha Mayer, MD 04/13/2016 9:02:36 AM This report has been signed electronically. Number of Addenda: 0

## 2016-04-13 NOTE — Progress Notes (Signed)
[name] to be D/C'd Home per MD order.  Discussed prescriptions and follow up appointments with the patient. Prescriptions given to patient, medication list explained in detail. Pt verbalized understanding.  Allergies as of 04/13/2016   No Known Allergies     Medication List    STOP taking these medications   enoxaparin 60 MG/0.6ML injection Commonly known as:  LOVENOX   glipiZIDE 2.5 MG 24 hr tablet Commonly known as:  GLUCOTROL XL     TAKE these medications   acetaminophen 650 MG CR tablet Commonly known as:  TYLENOL Take 650 mg by mouth every 4 (four) hours as needed for pain.   atenolol 25 MG tablet Commonly known as:  TENORMIN Take 25 mg by mouth daily.   atorvastatin 10 MG tablet Commonly known as:  LIPITOR Take 1 tablet (10 mg total) by mouth daily after supper.   docusate sodium 100 MG capsule Commonly known as:  COLACE Take 1 capsule (100 mg total) by mouth 2 (two) times daily.   feeding supplement (GLUCERNA SHAKE) Liqd Take 237 mLs by mouth 2 (two) times daily between meals.   feeding supplement (PRO-STAT SUGAR FREE 64) Liqd Take 30 mLs by mouth 2 (two) times daily.   ferrous sulfate 325 (65 FE) MG tablet Take 325 mg by mouth daily.   furosemide 40 MG tablet Commonly known as:  LASIX Take 40 mg by mouth daily.   hydrocortisone 10 MG tablet Commonly known as:  CORTEF Take 1 tablet (10 mg total) by mouth 2 (two) times daily.   KLOR-CON M20 20 MEQ tablet Generic drug:  potassium chloride SA Take 20 mEq by mouth daily.   multivitamin with minerals Tabs tablet Take 1 tablet by mouth daily.   ONE TOUCH ULTRA TEST test strip Generic drug:  glucose blood USE TO TEST ONCE DAILY   ONETOUCH DELICA LANCETS 99991111 Misc USE TO TEST ONCE DAILY Dx code E11.65   pantoprazole 40 MG tablet Commonly known as:  PROTONIX Take 1 tablet (40 mg total) by mouth 2 (two) times daily.   pioglitazone 30 MG tablet Commonly known as:  ACTOS TAKE 1 TABLET BY MOUTH  EVERY DAY What changed:  See the new instructions.   polyethylene glycol packet Commonly known as:  MIRALAX / GLYCOLAX Take 17 g by mouth daily. What changed:  when to take this   TRADJENTA 5 MG Tabs tablet Generic drug:  linagliptin TAKE 1 TABLET (5 MG TOTAL) BY MOUTH DAILY.   ZYTIGA 250 MG tablet Generic drug:  abiraterone Acetate TAKE 4 TABLETS (1,000MG ) BY MOUTH EVERY DAY ON AN EMPTY STOMACH 1 HOURBEFORE OR 2 HOURS AFTER A MEAL       Vitals:   04/13/16 0910 04/13/16 1024  BP: 139/63 137/67  Pulse: 68 66  Resp: (!) 21 19  Temp:  97.6 F (36.4 C)    Skin clean, dry and intact without evidence of skin break down, no evidence of skin tears noted. IV catheter discontinued intact. Site without signs and symptoms of complications. Dressing and pressure applied. Pt denies pain at this time. No complaints noted.  An After Visit Summary was printed and given to the patient. Patient escorted via [place], and D/C home via private auto.  Kevin Math, RN [facility] Phone [phone]

## 2016-04-13 NOTE — Transfer of Care (Signed)
Immediate Anesthesia Transfer of Care Note  Patient: [name]  Procedure(s) Performed: Procedure(s): ESOPHAGOGASTRODUODENOSCOPY (EGD) WITH PROPOFOL (N/A) BOTOX INJECTION (N/A) BALLOON DILATION (N/A)  Patient Location: Endoscopy Unit  Anesthesia Type:General  Level of Consciousness: awake and alert   Airway & Oxygen Therapy: Patient Spontanous Breathing and Patient connected to nasal cannula oxygen  Post-op Assessment: Report given to RN and Post -op Vital signs reviewed and stable  Post vital signs: Reviewed and stable  Last Vitals:  Vitals:   04/13/16 0421 04/13/16 0731  BP:  (!) 170/63  Pulse:  73  Resp: 14 (!) 24  Temp: 36.7 C     Last Pain:  Vitals:   04/12/16 2054  TempSrc: Oral         Complications: No apparent anesthesia complications

## 2016-04-13 NOTE — Progress Notes (Signed)
Pt ok to discharge home today on liquid diet. Has GI OV set 2/13 at 8:45 with Dr Owens Loffler.    Azucena Freed PA-C [phone]

## 2016-04-13 NOTE — Progress Notes (Signed)
Initial Nutrition Assessment  DOCUMENTATION CODES:   Not applicable  INTERVENTION:  Continue nutritional supplementation post discharge to aid in adequate caloric and protein needs.   Continue Glucerna Shake po BID, each supplement provides 220 kcal and 10 grams of protein.  Provide 30 ml Prostat po BID, each supplement provides 100 kcal and 15 grams of protein.   NUTRITION DIAGNOSIS:   Inadequate oral intake related to  (achalasia) as evidenced by  (liquid diet).  GOAL:   Patient will meet greater than or equal to 90% of their needs  MONITOR:   PO intake, Supplement acceptance, Labs, Weight trends, Skin, I & O's  REASON FOR ASSESSMENT:   Consult Assessment of nutrition requirement/status  ASSESSMENT:   76 y.o. 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 Pt reports eating well PTA with usual consumption of at least 3 meals a day with a protein shake at least once daily. Weight has been stable. Pt currently has Glucerna Shake ordered. RD to additionally order Prostat to aid in adequate protein needs. Pt encouraged to continue with nutritional supplements post discharge for adequate nutrition as he is being discharged on a liquid diet.   Pt with no observed significant fat or muscle mass loss.   Labs and medications reviewed.   Diet Order:  Diet clear liquid Room service appropriate? Yes; Fluid consistency: Thin  Skin:  Reviewed, no issues  Last BM:  1/4  Height:   Ht Readings from Last 1 Encounters:  04/13/16 5\' 8"  (1.727 m)    Weight:   Wt Readings from Last 1 Encounters:  04/13/16 151 lb (68.5 kg)    Ideal  Body Weight:  70 kg  BMI:  Body mass index is 22.96 kg/m.  Estimated Nutritional Needs:   Kcal:  [id number]  Protein:  80-90 grams  Fluid:  1.8 - 2 L/day  EDUCATION NEEDS:   Education needs addressed  Corrin Parker, MS, RD, LDN Pager # [phone] After hours/ weekend pager # [phone]

## 2016-04-13 NOTE — Anesthesia Postprocedure Evaluation (Signed)
Anesthesia Post Note  Patient: [name]  Procedure(s) Performed: Procedure(s) (LRB): ESOPHAGOGASTRODUODENOSCOPY (EGD) WITH PROPOFOL (N/A) BOTOX INJECTION (N/A) BALLOON DILATION (N/A)  Patient location during evaluation: PACU Anesthesia Type: General Level of consciousness: awake and alert Pain management: pain level controlled Vital Signs Assessment: post-procedure vital signs reviewed and stable Respiratory status: spontaneous breathing, nonlabored ventilation, respiratory function stable and patient connected to nasal cannula oxygen Cardiovascular status: blood pressure returned to baseline and stable Postop Assessment: no signs of nausea or vomiting Anesthetic complications: no       Last Vitals:  Vitals:   04/13/16 0910 04/13/16 1024  BP: 139/63 137/67  Pulse: 68 66  Resp: (!) 21 19  Temp:  36.4 C    Last Pain:  Vitals:   04/13/16 1024  TempSrc: Oral                 Catalina Gravel

## 2016-04-13 NOTE — Discharge Instructions (Signed)
Full Liquid Diet A full liquid diet may be used:  To help you transition from a clear liquid diet to a soft diet.  When your body is healing and can only tolerate foods that are easy to digest.  Before or after certain a procedure, test, or surgery (such as stomach or intestinal surgeries).  If you have trouble swallowing or chewing. A full liquid diet includes fluids and foods that are liquid or will become liquid at room temperature. The full liquid diet gives you the proteins, fluids, salts, and minerals that you need for energy. If you continue this diet for more than 72 hours, talk to your health care provider about how many calories you need to consume. If you continue the diet for more than 5 days, talk to your health care provider about taking a multivitamin or a nutritional supplement. What do I need to know about a full liquid diet?  You may have any liquid.  You may have any food that becomes a liquid at room temperature. The food is considered a liquid if it can be poured off a spoon at room temperature.  Drink one serving of citrus or vitamin C-enriched fruit juice daily. What foods can I eat? Grains  Any grain food that can be pureed in soup (such as crackers, pasta, and rice). Hot cereal (such as farina or oatmeal) that has been blended. Talk to your health care provider or dietitian about these foods. Vegetables  Pulp-free tomato or vegetable juice. Vegetables pureed in soup. Fruits  Fruit juice, including nectars and juices with pulp. Meats and Other Protein Sources  Eggs in custard, eggnog mix, and eggs used in ice cream or pudding. Strained meats, like in baby food, may be allowed. Consult your health care provider. Dairy  Milk and milk-based beverages, including milk shakes and instant breakfast mixes. Smooth yogurt. Pureed cottage cheese. Avoid these foods if they are not well tolerated. Beverages  All beverages, including liquid nutritional supplements. Ask your  health care provider if you can have carbonated beverages. They may not be well tolerated. Condiments  Iodized salt, pepper, spices, and flavorings. Cocoa powder. Vinegar, ketchup, yellow mustard, smooth sauces (such as hollandaise, cheese sauce, or white sauce), and soy sauce. Sweets and Desserts  Custard, smooth pudding. Flavored gelatin. Tapioca, junket. Plain ice cream, sherbet, fruit ices. Frozen ice pops, frozen fudge pops, pudding pops, and other frozen bars with cream. Syrups, including chocolate syrup. Sugar, honey, jelly. Fats and Oils  Margarine, butter, cream, sour cream, and oils. Other  Broth and cream soups. Strained, broth-based soups. The items listed above may not be a complete list of recommended foods or beverages. Contact your dietitian for more options.  What foods can I not eat? Grains  All breads. Grains are not allowed unless they are pureed into soup. Vegetables  Vegetables are not allowed unless they are juiced, or cooked and pureed into soup. Fruits  Fruits are not allowed unless they are juiced. Meats and Other Protein Sources  Any meat or fish. Cooked or raw eggs. Nut butters. Dairy  Cheese. Condiments  Stone ground mustards. Fats and Oils  Fats that are coarse or chunky. Sweets and Desserts  Ice cream or other frozen desserts that have any solids in them or on top, such as nuts, chocolate chips, and pieces of cookies. Cakes. Cookies. Candy. Others  Soups with chunks or pieces in them. The items listed above may not be a complete list of foods and beverages to avoid.   Contact your dietitian for more information.  This information is not intended to replace advice given to you by your health care provider. Make sure you discuss any questions you have with your health care provider. Document Released: 03/26/2005 Document Revised: 09/01/2015 Document Reviewed: 01/29/2013 Elsevier Interactive Patient Education  2017 Elsevier Inc.  

## 2016-04-13 NOTE — Discharge Summary (Addendum)
Physician Discharge Summary  [name] [id number] DOB: 1941/03/05 DOA: 04/11/2016  PCP: Dr. Dwyane Dee   Admit date: 04/11/2016 Discharge date: 04/13/2016  Admitted From: Home Disposition:  Home  Recommendations for Outpatient Follow-up:  1. Follow up with PCP in 1 week 2. Follow up with Dr Owens Loffler (GI) on 2/13 3. 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 Patient states that due to his achalasia that was diagnosed numerous years ago, he is unable to have adequate oral intake. EMS found to be orthostatic as well as hypoglycemic with blood glucose of 64. GI was consulted and patient underwent water-soluble esophagram which showed distal esophageal obstruction with no emptying into the stomach. 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 Area of stenosis was successfully injected with botulinum toxin. Patient is to continue on full liquid diet at home. He should follow-up with GI as outpatient. PEG considered in the future for nutrition. Discussed care and plan with wife over the phone at time of discharge.   Discharge Diagnoses:  Principal Problem:   Dehydration Active Problems:   Diabetes mellitus type 2 in nonobese [facility])   Essential hypertension   Chronic deep vein thrombosis (DVT) (HCC)   Hypokalemia   Acute kidney injury superimposed on chronic kidney disease (HCC)   Hypoglycemia   Orthostatic hypotension  Near syncopal episode -Likely secondary to  dehydration, hypoglycemia, poor oral intake secondary to achalasia -Orthostatic vital sign negative   Esophageal achalasia -Last LES botox injection 02/28/2016 -Water-soluble esophogram shows distal esophageal obstruction with no emptying into stomach  -EGD 1/5 showed 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 Area of stenosis was successfully injected with botulinum toxin. -PEG should be considered in the future if no improvement. Discussed with wife today.   AKI on CKD stage 3 -Due to poor oral intake and dehydration -Baseline creatinine appears to be around 1.5-1.6 -Improving with IV fluids  -Trend BMP   Essential HTN -Continue tenormin   HLD -Continue lipitor   Hx of right lower leg DVT in 2012 -Wife states that he was treated with anticoagulant for sometime which was discontinued. He was recently discharged from rehab with Lovenox ppx as he has been more immobile. He is now able to walk with a cane. Encouraged mobility and will DC lovenox.    DM type 2 -May resume home hypoglycemic. Will stop Glucotrol time of discharge -SSI  Hx prostate cancer -Continue zytiga, cortef    Discharge Instructions  Discharge Instructions    Increase activity slowly    Complete by:  As directed      Allergies as of 04/13/2016   No Known Allergies     Medication List    STOP taking these medications   enoxaparin 60 MG/0.6ML injection Commonly known as:  LOVENOX   glipiZIDE 2.5 MG 24 hr tablet Commonly known as:  GLUCOTROL XL     TAKE these medications   acetaminophen 650 MG CR tablet Commonly known as:  TYLENOL Take 650 mg by mouth every 4 (four) hours as needed  for pain.   atenolol 25 MG tablet Commonly known as:  TENORMIN Take 25 mg by mouth daily.   atorvastatin 10 MG tablet Commonly known as:  LIPITOR Take 1 tablet (10 mg total) by mouth daily after supper.   docusate sodium 100 MG capsule Commonly known as:  COLACE Take 1 capsule (100  mg total) by mouth 2 (two) times daily.   feeding supplement (GLUCERNA SHAKE) Liqd Take 237 mLs by mouth 2 (two) times daily between meals.   feeding supplement (PRO-STAT SUGAR FREE 64) Liqd Take 30 mLs by mouth 2 (two) times daily.   ferrous sulfate 325 (65 FE) MG tablet Take 325 mg by mouth daily.   furosemide 40 MG tablet Commonly known as:  LASIX Take 40 mg by mouth daily.   hydrocortisone 10 MG tablet Commonly known as:  CORTEF Take 1 tablet (10 mg total) by mouth 2 (two) times daily.   KLOR-CON M20 20 MEQ tablet Generic drug:  potassium chloride SA Take 20 mEq by mouth daily.   multivitamin with minerals Tabs tablet Take 1 tablet by mouth daily.   ONE TOUCH ULTRA TEST test strip Generic drug:  glucose blood USE TO TEST ONCE DAILY   ONETOUCH DELICA LANCETS 99991111 Misc USE TO TEST ONCE DAILY Dx code E11.65   pantoprazole 40 MG tablet Commonly known as:  PROTONIX Take 1 tablet (40 mg total) by mouth 2 (two) times daily.   pioglitazone 30 MG tablet Commonly known as:  ACTOS TAKE 1 TABLET BY MOUTH EVERY DAY What changed:  See the new instructions.   polyethylene glycol packet Commonly known as:  MIRALAX / GLYCOLAX Take 17 g by mouth daily. What changed:  when to take this   TRADJENTA 5 MG Tabs tablet Generic drug:  linagliptin TAKE 1 TABLET (5 MG TOTAL) BY MOUTH DAILY.   ZYTIGA 250 MG tablet Generic drug:  abiraterone Acetate TAKE 4 TABLETS (1,000MG ) BY MOUTH EVERY DAY ON AN EMPTY STOMACH 1 HOURBEFORE OR 2 HOURS AFTER A MEAL      Follow-up Information    [name], MD Follow up on 05/22/2016.   Specialty:  Gastroenterology Why:  8:45 for visit with GI doctor.  Contact information: [address] [place] [address] [phone]        KUMAR,AJAY, MD. Schedule an appointment as soon as possible for a visit in 1 week(s).   Specialty:  Endocrinology Contact information: [place] [address] [phone]           No Known Allergies  Consultations:  GI   Procedures/Studies: Dg Esophagus  Result Date: 04/12/2016 CLINICAL DATA:  The patient has a known history of achalasia with difficulty swallowing. The study was ordered with water soluble contrast to assess for esophageal emptying. The study was to be converted to a barium study if esophageal emptying occurred. EXAM: ESOPHOGRAM/BARIUM SWALLOW TECHNIQUE: Single contrast examination was performed using water-soluble contrast. FLUOROSCOPY TIME:  Fluoroscopy Time:  30 seconds Radiation Exposure Index (if provided by the fluoroscopic device): Number of Acquired Spot Images: 2 COMPARISON:  None. FINDINGS: The patient swallowed several mouthfuls of water-soluble contrast. The contrast pooled in the distal esophagus with esophageal distention and no emptying into the stomach. No further imaging was obtained due to lack of emptying into the stomach. IMPRESSION: Distal esophageal obstruction with no emptying into the stomach. The findings are likely due to the patient's reported history of achalasia. Electronically Signed   By: Dorise Bullion  III M.D   On: 04/12/2016 08:46    EGD 04/13/2016     Discharge Exam: Vitals:   04/13/16 0910 04/13/16 1024  BP: 139/63 137/67  Pulse: 68 66  Resp: (!) 21 19  Temp:  97.6 F (36.4 C)   Vitals:   04/13/16 0852 04/13/16 0900 04/13/16 0910 04/13/16 1024  BP: 119/65 135/65 139/63 137/67  Pulse: 68 63 68 66  Resp: 13 16 (!) 21 19  Temp: 98.2 F (36.8 C)   97.6 F (36.4 C)  TempSrc: Oral   Oral  SpO2: 100% 100% 100% 99%  Weight:      Height:        General: Pt is alert, awake, not in acute distress Cardiovascular: RRR, S1/S2 +, no rubs, no gallops Respiratory: CTA bilaterally, no wheezing, no rhonchi Abdominal: Soft, NT, ND, bowel sounds + Extremities: no edema, no cyanosis    The results of significant diagnostics from this hospitalization (including imaging, microbiology, ancillary and laboratory) are  listed below for reference.     Microbiology: No results found for this or any previous visit (from the past 240 hour(s)).   Labs: BNP (last 3 results) No results for input(s): BNP in the last 8760 hours. Basic Metabolic Panel:  Recent Labs Lab 04/10/16 0808 04/11/16 1113 04/11/16 1634 04/12/16 0537 04/13/16 1111  NA 144 142  --  142 141  K 3.5 2.9*  --  3.6 3.8  CL 106 106  --  110 110  CO2 30 28  --  25 22  GLUCOSE 128* 82  --  108* 106*  BUN 15 12  --  13 13  CREATININE 2.17* 2.11*  --  1.95* 1.84*  CALCIUM 7.6* 7.8*  --  7.1* 7.5*  MG  --   --  1.3*  --   --    Liver Function Tests:  Recent Labs Lab 04/10/16 0808 04/12/16 0537  AST 19 21  ALT 10 9*  ALKPHOS 105 89  BILITOT 0.5 0.6  PROT 4.6* 4.0*  ALBUMIN 2.1* 1.4*   No results for input(s): LIPASE, AMYLASE in the last 168 hours. No results for input(s): AMMONIA in the last 168 hours. CBC:  Recent Labs Lab 04/11/16 1113 04/12/16 0537  WBC 11.8* 12.0*  NEUTROABS 10.0*  --   HGB 10.4* 8.7*  HCT 31.3* 26.1*  MCV 87.2 87.6  PLT 297 228   Cardiac Enzymes: No results for input(s): CKTOTAL, CKMB, CKMBINDEX, TROPONINI in the last 168 hours. BNP: Invalid input(s): POCBNP CBG:  Recent Labs Lab 04/12/16 1215 04/12/16 1622 04/12/16 2117 04/13/16 0723 04/13/16 1136  GLUCAP 96 155* 120* 108* 88   D-Dimer No results for input(s): DDIMER in the last 72 hours. Hgb A1c No results for input(s): HGBA1C in the last 72 hours. Lipid Profile No results for input(s): CHOL, HDL, LDLCALC, TRIG, CHOLHDL, LDLDIRECT in the last 72 hours. Thyroid function studies No results for input(s): TSH, T4TOTAL, T3FREE, THYROIDAB in the last 72 hours.  Invalid input(s): FREET3 Anemia work up No results for input(s): VITAMINB12, FOLATE, FERRITIN, TIBC, IRON, RETICCTPCT in the last 72 hours. Urinalysis    Component Value Date/Time   COLORURINE YELLOW 03/14/2016 1502   APPEARANCEUR CLEAR 03/14/2016 1502   LABSPEC 1.009  03/14/2016 1502   PHURINE 5.0 03/14/2016 1502   GLUCOSEU NEGATIVE 03/14/2016 1502   GLUCOSEU NEGATIVE 09/22/2014 0757   HGBUR NEGATIVE 03/14/2016 1502   BILIRUBINUR NEGATIVE 03/14/2016 1502   BILIRUBINUR n 04/30/2011 1534   [place] 03/14/2016  [place] 03/14/2016 1502   UROBILINOGEN 0.2 09/22/2014 0757   NITRITE NEGATIVE 03/14/2016 1502   LEUKOCYTESUR NEGATIVE 03/14/2016 1502   Sepsis Labs Invalid input(s): PROCALCITONIN,  WBC,  LACTICIDVEN Microbiology No results found for this or any previous visit (from the past 240 hour(s)).   Time coordinating discharge: Over 30 minutes  SIGNED:  Dessa Phi, DO [facility] Pager [phone]  If 7PM-7AM, please contact night-coverage [email or web address] Password [email or web address] 04/13/2016, 5:27 PM

## 2016-04-16 ENCOUNTER — Other Ambulatory Visit: Payer: Self-pay

## 2016-04-16 ENCOUNTER — Ambulatory Visit: Payer: Medicare Other | Admitting: Endocrinology

## 2016-04-16 ENCOUNTER — Encounter: Payer: Self-pay | Admitting: Endocrinology

## 2016-04-16 ENCOUNTER — Ambulatory Visit (INDEPENDENT_AMBULATORY_CARE_PROVIDER_SITE_OTHER): Payer: Medicare Other | Admitting: Endocrinology

## 2016-04-16 VITALS — BP 110/64 | HR 78 | Ht 68.5 in | Wt 160.0 lb

## 2016-04-16 DIAGNOSIS — E1165 Type 2 diabetes mellitus with hyperglycemia: Secondary | ICD-10-CM | POA: Diagnosis not present

## 2016-04-16 LAB — GLUCOSE, POCT (MANUAL RESULT ENTRY): POC Glucose: 201 mg/dl — AB (ref 70–99)

## 2016-04-16 MED ORDER — GLIPIZIDE ER 2.5 MG PO TB24: 2.5000 mg | ORAL_TABLET | Freq: Every day | ORAL | 3 refills | Status: DC

## 2016-04-16 MED ORDER — GLUCOSE BLOOD VI STRP: ORAL_STRIP | 3 refills | Status: DC

## 2016-04-16 NOTE — Patient Instructions (Addendum)
Leave off pioglitazone  Call if sugars go >180  Cut back on juices

## 2016-04-16 NOTE — Addendum Note (Signed)
Addended by: Nile Riggs on: 04/16/2016 05:17 PM   Modules accepted: Orders

## 2016-04-16 NOTE — Progress Notes (Signed)
Patient ID: [name], male   DOB: 01/22/1941, 76 y.o.   MRN: [id number]   Reason for Appointment : Followup  History of Present Illness          Diagnosis: Type 2 diabetes mellitus, date of diagnosis:   2001     Past history: He has had long-standing diabetes treated with various oral hypoglycemic drugs.  He has not been on metformin because of renal dysfunction He thinks he was given Actos for sometime but not clear if he was benefiting. This was stopped probably in 2012 but he does not think he had any side effects. He thinks he has been taking Tradjenta for a couple of years  His A1c has been monitored about once or twice a year as per records  Because of worsening A1c in 12/14 he was referred for further evaluation. Because of baseline A1c of 8.6% in 12/14 he was started on glipizide ER 5 mg and pioglitazone 30 mg daily in addition to his Tradjenta He was also started on home glucose monitoring with a One Touch monitor  Recent history:   He is taking a 3 drug regimen of Actos 30 mg, Tradjenta   His A1c is relatively better at 5.9, previously 7.1 His last visit was in 9/17    Current management, blood sugar patterns and problems identified:   he has been checking his blood sugars only rarely and his test strips aren't expired  Not clear why he is not taking glipizide now.  He is on a liquid/soft diet and is drinking juice throughout the day, he gets diarrhea with Ensure.  Glucose today without excessive amount of juice is 201  Glucose monitoring:   using a  One Touch ultra mini monitor   Currently using expired test strips and has only 2 readings recently, highest 143  Hypoglycemia: None    Self-care: The diet that the patient has been following is: None  Meals: 3 meals per day. Still has some sweet tea and juices such as grape or apple juice  Exercise:  none recently Dietician visit:  1/16.  CDE visit 5/15             Weight history:  Wt  Readings from Last 3 Encounters:  04/16/16 160 lb (72.6 kg)  04/13/16 151 lb (68.5 kg)  03/14/16 141 lb 15.6 oz (64.4 kg)   Glycemic control:   Lab Results  Component Value Date   HGBA1C 5.9 04/10/2016   HGBA1C 6.1 (H) 03/14/2016   HGBA1C 7.1 (H) 12/21/2015   Lab Results  Component Value Date   MICROALBUR <0.7 08/23/2015   LDLCALC 75 12/21/2015   CREATININE 1.84 (H) 04/13/2016        Allergies as of 04/16/2016   No Known Allergies     Medication List       Accurate as of 04/16/16  2:32 PM. Always use your most recent med list.          acetaminophen 650 MG CR tablet Commonly known as:  TYLENOL Take 650 mg by mouth every 4 (four) hours as needed for pain.   atenolol 25 MG tablet Commonly known as:  TENORMIN Take 25 mg by mouth daily.   atorvastatin 10 MG tablet Commonly known as:  LIPITOR Take 1 tablet (10 mg total) by mouth daily after supper.   docusate sodium 100 MG capsule Commonly known as:  COLACE Take 1 capsule (100 mg total) by mouth 2 (two) times daily.  feeding supplement (GLUCERNA SHAKE) Liqd Take 237 mLs by mouth 2 (two) times daily between meals.   feeding supplement (PRO-STAT SUGAR FREE 64) Liqd Take 30 mLs by mouth 2 (two) times daily.   ferrous sulfate 325 (65 FE) MG tablet Take 325 mg by mouth daily.   furosemide 40 MG tablet Commonly known as:  LASIX Take 40 mg by mouth daily.   hydrocortisone 10 MG tablet Commonly known as:  CORTEF Take 1 tablet (10 mg total) by mouth 2 (two) times daily.   KLOR-CON M20 20 MEQ tablet Generic drug:  potassium chloride SA Take 20 mEq by mouth daily.   multivitamin with minerals Tabs tablet Take 1 tablet by mouth daily.   ONE TOUCH ULTRA TEST test strip Generic drug:  glucose blood USE TO TEST ONCE DAILY   ONETOUCH DELICA LANCETS 28J Misc USE TO TEST ONCE DAILY Dx code E11.65   pantoprazole 40 MG tablet Commonly known as:  PROTONIX Take 1 tablet (40 mg total) by mouth 2 (two) times  daily.   pioglitazone 30 MG tablet Commonly known as:  ACTOS TAKE 1 TABLET BY MOUTH EVERY DAY   polyethylene glycol packet Commonly known as:  MIRALAX / GLYCOLAX Take 17 g by mouth daily.   TRADJENTA 5 MG Tabs tablet Generic drug:  linagliptin TAKE 1 TABLET (5 MG TOTAL) BY MOUTH DAILY.   ZYTIGA 250 MG tablet Generic drug:  abiraterone Acetate TAKE 4 TABLETS (1,000MG) BY MOUTH EVERY DAY ON AN EMPTY STOMACH 1 HOURBEFORE OR 2 HOURS AFTER A MEAL       Allergies: No Known Allergies  Past Medical History:  Diagnosis Date  . Cataract   . CKD (chronic kidney disease) 02/2016  . Diabetes mellitus, type 2 ([place]) 2008  . Hyperlipidemia 2008  . Hypertension 2008  . Prostate cancer ([place]) 2006    Past Surgical History:  Procedure Laterality Date  . COLONOSCOPY W/ POLYPECTOMY  03/2008   diminutive rectal polyp (path: prolapse type polyp, not adenoma).  moderate pan-diverticulitis.   Marland Kitchen ESOPHAGEAL MANOMETRY  1984   unable to pass manometry catheter beyond LES. Aperiystalsis of esophagus: sugg of achalasia.   Marland Kitchen ESOPHAGOGASTRODUODENOSCOPY N/A 02/17/2016   Procedure: ESOPHAGOGASTRODUODENOSCOPY (EGD);  Surgeon: [name], MD;  Location: [facility];  Service: Endoscopy;  Laterality: N/A;  . ESOPHAGOGASTRODUODENOSCOPY (EGD) WITH PROPOFOL N/A 02/21/2016   Procedure: ESOPHAGOGASTRODUODENOSCOPY (EGD) WITH PROPOFOL;  Surgeon: Jerene Bears, MD;  Location: [facility];  Service: Gastroenterology;  Laterality: N/A;  . ESOPHAGOGASTRODUODENOSCOPY (EGD) WITH PROPOFOL N/A 02/28/2016   Procedure: ESOPHAGOGASTRODUODENOSCOPY (EGD) WITH PROPOFOL withBOTOX;  Surgeon: Manus Gunning, MD;  Location: [facility];  Service: Gastroenterology;  Laterality: N/A;  . PROSTATE SURGERY     radiation therapy only    Family History  Problem Relation Age of Onset  . Diabetes Father   . Diabetes Sister   . Hypertension Sister   . Hypertension Brother   . Heart disease Neg Hx     Social History:   reports that he quit smoking about 18 years ago. He quit after 10.00 years of use. He has never used smokeless tobacco. He reports that he does not drink alcohol or use drugs.    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 He has been told that he has a cataract since he was young                The blood pressure has been controlled with medications for several years and he is taking atenolol 25 mg only.     Followed by nephrologist Dr. Florene Glen for his chronic kidney disease Also on Lasix daily Renal function stable  Not clear why he was placed on hydrocortisone in November at the hospital    LABS:  Admission on 04/11/2016, Discharged on 04/13/2016  Component Date Value Ref Range Status  . WBC 04/11/2016 11.8* 4.0 - 10.5 K/uL Final  . RBC 04/11/2016 3.59* 4.22 - 5.81 MIL/uL Final  . Hemoglobin 04/11/2016 10.4* 13.0 - 17.0 g/dL Final  . HCT 04/11/2016 31.3* 39.0 - 52.0 % Final  . MCV 04/11/2016 87.2  78.0 - 100.0 fL Final  . MCH 04/11/2016 29.0  26.0 - 34.0 pg Final  . MCHC 04/11/2016 33.2  30.0 - 36.0 g/dL Final  . RDW 04/11/2016 16.8* 11.5 - 15.5 % Final  . Platelets 04/11/2016 297  150 - 400 K/uL Final  . Neutrophils Relative % 04/11/2016 86  % Final  . Neutro Abs 04/11/2016 10.0* 1.7 - 7.7 K/uL Final  . Lymphocytes Relative 04/11/2016 10  % Final  . Lymphs Abs 04/11/2016 1.2  0.7 - 4.0 K/uL Final  . Monocytes Relative 04/11/2016 4  % Final  . Monocytes Absolute 04/11/2016 0.5  0.1 - 1.0 K/uL Final  . Eosinophils Relative 04/11/2016 0  % Final  . Eosinophils Absolute 04/11/2016 0.0  0.0 - 0.7 K/uL Final  . Basophils Relative 04/11/2016 0  % Final  . Basophils Absolute 04/11/2016  0.0  0.0 - 0.1 K/uL Final  . Sodium 04/11/2016 142  135 - 145 mmol/L Final  . Potassium 04/11/2016 2.9* 3.5 - 5.1 mmol/L Final  . Chloride 04/11/2016 106  101 - 111 mmol/L Final  . CO2 04/11/2016 28  22 - 32 mmol/L Final  . Glucose, Bld 04/11/2016 82  65 - 99 mg/dL Final  . BUN 04/11/2016 12  6 - 20 mg/dL Final  . Creatinine, Ser 04/11/2016 2.11* 0.61 - 1.24 mg/dL Final  . Calcium 04/11/2016 7.8* 8.9 - 10.3 mg/dL Final  . GFR calc non Af Amer 04/11/2016 29* >60 mL/min Final  . GFR calc Af Amer 04/11/2016 34* >60 mL/min Final   Comment: (NOTE) The eGFR has been calculated using the CKD EPI equation. This calculation has not been validated in all clinical situations. eGFR's persistently <60 mL/min signify possible Chronic Kidney Disease.   . Anion gap 04/11/2016 8  5 - 15 Final  . Glucose-Capillary 04/11/2016 59* 65 - 99 mg/dL Final  . Glucose-Capillary 04/11/2016 120* 65 - 99 mg/dL Final  . Magnesium 04/11/2016 1.3* 1.7 - 2.4 mg/dL Final  . Glucose-Capillary 04/11/2016 151* 65 - 99 mg/dL Final  . Sodium 04/12/2016 142  135 - 145 mmol/L Final  . Potassium 04/12/2016 3.6  3.5 - 5.1 mmol/L Final  . Chloride 04/12/2016 110  101 - 111 mmol/L Final  . CO2 04/12/2016 25  22 - 32 mmol/L Final  . Glucose, Bld 04/12/2016 108* 65 - 99 mg/dL Final  . BUN 04/12/2016 13  6 - 20 mg/dL Final  . Creatinine, Ser 04/12/2016 1.95* 0.61 - 1.24 mg/dL  Final  . Calcium 04/12/2016 7.1* 8.9 - 10.3 mg/dL Final  . Total Protein 04/12/2016 4.0* 6.5 - 8.1 g/dL Final  . Albumin 04/12/2016 1.4* 3.5 - 5.0 g/dL Final  . AST 04/12/2016 21  15 - 41 U/L Final  . ALT 04/12/2016 9* 17 - 63 U/L Final  . Alkaline Phosphatase 04/12/2016 89  38 - 126 U/L Final  . Total Bilirubin 04/12/2016 0.6  0.3 - 1.2 mg/dL Final  . GFR calc non Af Amer 04/12/2016 32* >60 mL/min Final  . GFR calc Af Amer 04/12/2016 37* >60 mL/min Final   Comment: (NOTE) The eGFR has been calculated using the CKD EPI equation. This calculation has not  been validated in all clinical situations. eGFR's persistently <60 mL/min signify possible Chronic Kidney Disease.   . Anion gap 04/12/2016 7  5 - 15 Final  . WBC 04/12/2016 12.0* 4.0 - 10.5 K/uL Final  . RBC 04/12/2016 2.98* 4.22 - 5.81 MIL/uL Final  . Hemoglobin 04/12/2016 8.7* 13.0 - 17.0 g/dL Final  . HCT 04/12/2016 26.1* 39.0 - 52.0 % Final  . MCV 04/12/2016 87.6  78.0 - 100.0 fL Final  . MCH 04/12/2016 29.2  26.0 - 34.0 pg Final  . MCHC 04/12/2016 33.3  30.0 - 36.0 g/dL Final  . RDW 04/12/2016 16.8* 11.5 - 15.5 % Final  . Platelets 04/12/2016 228  150 - 400 K/uL Final  . Glucose-Capillary 04/11/2016 111* 65 - 99 mg/dL Final  . Glucose-Capillary 04/12/2016 78  65 - 99 mg/dL Final  . Glucose-Capillary 04/12/2016 67  65 - 99 mg/dL Final  . Glucose-Capillary 04/12/2016 54* 65 - 99 mg/dL Final  . Glucose-Capillary 04/12/2016 61* 65 - 99 mg/dL Final  . Glucose-Capillary 04/12/2016 96  65 - 99 mg/dL Final  . Glucose-Capillary 04/12/2016 155* 65 - 99 mg/dL Final  . Glucose-Capillary 04/12/2016 120* 65 - 99 mg/dL Final  . Glucose-Capillary 04/13/2016 108* 65 - 99 mg/dL Final  . Sodium 04/13/2016 141  135 - 145 mmol/L Final  . Potassium 04/13/2016 3.8  3.5 - 5.1 mmol/L Final  . Chloride 04/13/2016 110  101 - 111 mmol/L Final  . CO2 04/13/2016 22  22 - 32 mmol/L Final  . Glucose, Bld 04/13/2016 106* 65 - 99 mg/dL Final  . BUN 04/13/2016 13  6 - 20 mg/dL Final  . Creatinine, Ser 04/13/2016 1.84* 0.61 - 1.24 mg/dL Final  . Calcium 04/13/2016 7.5* 8.9 - 10.3 mg/dL Final  . GFR calc non Af Amer 04/13/2016 34* >60 mL/min Final  . GFR calc Af Amer 04/13/2016 40* >60 mL/min Final   Comment: (NOTE) The eGFR has been calculated using the CKD EPI equation. This calculation has not been validated in all clinical situations. eGFR's persistently <60 mL/min signify possible Chronic Kidney Disease.   . Anion gap 04/13/2016 9  5 - 15 Final  . Glucose-Capillary 04/13/2016 88  65 - 99 mg/dL Final    Lab on 04/10/2016  Component Date Value Ref Range Status  . Hgb A1c MFr Bld 04/10/2016 5.9  4.6 - 6.5 % Final  . Sodium 04/10/2016 144  135 - 145 mEq/L Final  . Potassium 04/10/2016 3.5  3.5 - 5.1 mEq/L Final  . Chloride 04/10/2016 106  96 - 112 mEq/L Final  . CO2 04/10/2016 30  19 - 32 mEq/L Final  . Glucose, Bld 04/10/2016 128* 70 - 99 mg/dL Final  . BUN 04/10/2016 15  6 - 23 mg/dL Final  . Creatinine, Ser 04/10/2016 2.17* 0.40 -  1.50 mg/dL Final  . Total Bilirubin 04/10/2016 0.5  0.2 - 1.2 mg/dL Final  . Alkaline Phosphatase 04/10/2016 105  39 - 117 U/L Final  . AST 04/10/2016 19  0 - 37 U/L Final  . ALT 04/10/2016 10  0 - 53 U/L Final  . Total Protein 04/10/2016 4.6* 6.0 - 8.3 g/dL Final  . Albumin 04/10/2016 2.1* 3.5 - 5.2 g/dL Final  . Calcium 04/10/2016 7.6* 8.4 - 10.5 mg/dL Final  . GFR 04/10/2016 38.31* >60.00 mL/min Final    Physical Examination:  BP 110/64   Pulse 78   Ht 5' 8.5" (1.74 m)   Wt 160 lb (72.6 kg)   SpO2 95%   BMI 23.97 kg/m    2+ edema present  ASSESSMENT/PLAN:  1. Diabetes type 2, nonobese See history of present illness for discussion of current diabetes management, blood sugar patterns and problems identified  Although his A1c is fairly good his blood sugars are difficult to assess because of lack of glucose monitoring Also recently with going on a liquid diet he is not getting balanced meals and more simple sugars, glucose today is 206  Currently also getting significant edema even with Lasix  Recommendations:  He will get new test strips, currently has expired test strips  Check blood sugars consistently every day  Trial of glipizide ER 2.5 mg daily again  Stop Actos until edema controlled  2. Renal failure with stable to improved creatinine    3.  Hypertension:  Control has been good  4.  Hypercholesterolemia: He is taking 10 mg Lipitor with control   5.?  Adrenal insufficiency: No clear-cut diagnosis made last October and not  clear why he is on hydrocortisone, will reassess on the next visit   Patient Instructions  Leave off pioglitazone  Call if sugars go >180  Cut back on juices           Helaine Yackel 04/16/2016, 2:32 PM   Note: This office note was prepared with Dragon voice recognition system technology. Any transcriptional errors that result from this process are unintentional.

## 2016-04-17 ENCOUNTER — Telehealth: Payer: Self-pay | Admitting: Endocrinology

## 2016-04-17 NOTE — Telephone Encounter (Signed)
Melissa with [facility] called back about seeing if Dr. Dwyane Dee would sign the original orders that were sent into the office, I advised her that if these orders were diabetic specific that he would not sign off on them.  I suggested that he establish care with a new PCP.  She voiced understanding.

## 2016-04-27 ENCOUNTER — Telehealth: Payer: Self-pay | Admitting: Endocrinology

## 2016-04-27 NOTE — Telephone Encounter (Signed)
Patient need 50 strips of his   glucose blood (ONE TOUCH ULTRA TEST) test strip  And also need dx code  [id number] [address], [place] [phone] (Phone) [phone] (Fax)

## 2016-04-30 ENCOUNTER — Other Ambulatory Visit: Payer: Self-pay

## 2016-04-30 MED ORDER — GLUCOSE BLOOD VI STRP: ORAL_STRIP | 3 refills | Status: DC

## 2016-04-30 NOTE — Telephone Encounter (Signed)
Ordered 04/30/16 

## 2016-05-14 ENCOUNTER — Other Ambulatory Visit: Payer: Self-pay | Admitting: Oncology

## 2016-05-14 DIAGNOSIS — Z8546 Personal history of malignant neoplasm of prostate: Secondary | ICD-10-CM

## 2016-05-22 ENCOUNTER — Ambulatory Visit (INDEPENDENT_AMBULATORY_CARE_PROVIDER_SITE_OTHER): Payer: Medicare Other | Admitting: Gastroenterology

## 2016-05-22 ENCOUNTER — Encounter: Payer: Self-pay | Admitting: Gastroenterology

## 2016-05-22 VITALS — BP 120/70 | HR 88 | Ht 67.0 in | Wt 151.1 lb

## 2016-05-22 DIAGNOSIS — K220 Achalasia of cardia: Secondary | ICD-10-CM | POA: Diagnosis not present

## 2016-05-22 NOTE — Patient Instructions (Signed)
You should never eat solid foods again. I will forward this to Dr. Silverio Decamp to consider Achalasia treatment with pneumatic dilation. Otherwise follow up with Dr. Henrene Pastor as needed.

## 2016-05-22 NOTE — Progress Notes (Signed)
Review of pertinent gastrointestinal problems: 1. Achalasia: was told he had this many many years ago, declined surgery: Previous patient of Dr. Sharlett Iles and then Dr. Henrene Pastor (03/2008) 02/17/2016 EGD. Dr. Ardis Hughs,  for anemia, FOBT positive stool. Very dilated, fluid-filled esophagus cleared with cough and at, lavage, suctioning following airway intubation. Suspect this is due to achalasia. Mucosa at GE junction was abnormal, the lumen was narrowed. Biopsies obtained and showed reactive changes/inflammation.. 02/21/16 EGD. Dr. Hilarie Fredrickson.  Esophageal lumen severely dilated with pooled liquid and debris including medication and pills. 500 mL of fluid aspirated. Severe esophagitis in the middle and lower third of the esophagus and at the GE junction. Gastritis also noted. Due to patient having hypotension during the procedure and possibly a developing sepsis, plans for Botox to the LES was aborted. 02/28/16 EGD. Dr. Havery Moros. Upper and middle esophagus dilated with residual liquid contents requiring suctioning. Esophagitis appeared to be improved. Large amount of food residue noted in the stomach. Botox was injected to the LES. 04/13/2016 EGD with Dr. Carlean Purl: dilated esophagus, fluid and food within, cleared. GE junction dilated with TTS balloon to 74m and then botox injected again; recommended strict liquid diet, consider PEG in future   HPI: This is a  very pleasant 75year old man whom I met about 3 months ago when he was hospitalized at [facility]. He is previously a patient of Dr. PHenrene Pastor inadvertently put on my schedule but I'm happy to care for him today.  I reviewed his course over the past 2-3 months after I initially saw him.  Chief complaint is achalasia  He is able to maintain a full liquid only diet although this is very frustrating for him. He is having no nausea or vomiting. His weight is going up gradually.  He does not seem to have trouble with liquids and he has not tried solid food since  his last hospitalization about a month ago.  His son is with him today.   Weight today is 151 pounds in our office; weight was 142 in October 2017; weight was 151 04/13/2016  ROS: complete GI ROS as described in HPI.  Constitutional:  No unintentional weight loss   Past Medical History:  Diagnosis Date  . Cataract   . CKD (chronic kidney disease) 02/2016  . Diabetes mellitus, type 2 ([place] 2008  . Hyperlipidemia 2008  . Hypertension 2008  . Prostate cancer ([place] 2006    Past Surgical History:  Procedure Laterality Date  . BALLOON DILATION N/A 04/13/2016   Procedure: BALLOON DILATION;  Surgeon: CGatha Mayer MD;  [facility];  Service: Endoscopy;  Laterality: N/A;  . BOTOX INJECTION N/A 04/13/2016   Procedure: BOTOX INJECTION;  Surgeon: CGatha Mayer MD;  [facility]  Service: Endoscopy;  Laterality: N/A;  . COLONOSCOPY W/ POLYPECTOMY  03/2008   diminutive rectal polyp (path: prolapse type polyp, not adenoma).  moderate pan-diverticulitis.   .Marland KitchenESOPHAGEAL MANOMETRY  1984   unable to pass manometry catheter beyond LES. Aperiystalsis of esophagus: sugg of achalasia.   .Marland KitchenESOPHAGOGASTRODUODENOSCOPY N/A 02/17/2016   Procedure: ESOPHAGOGASTRODUODENOSCOPY (EGD);  Surgeon: DMilus Banister MD;  [facility];  Service: Endoscopy;  Laterality: N/A;  . ESOPHAGOGASTRODUODENOSCOPY (EGD) WITH PROPOFOL N/A 02/21/2016   Procedure: ESOPHAGOGASTRODUODENOSCOPY (EGD) WITH PROPOFOL;  Surgeon: JJerene Bears MD;  [facility];  Service: Gastroenterology;  Laterality: N/A;  . ESOPHAGOGASTRODUODENOSCOPY (EGD) WITH PROPOFOL N/A 02/28/2016   Procedure: ESOPHAGOGASTRODUODENOSCOPY (EGD) WITH PROPOFOL withBOTOX;  Surgeon: SManus Gunning MD;  Location: [facility];  Service: Gastroenterology;  Laterality: N/A;  . ESOPHAGOGASTRODUODENOSCOPY (EGD) WITH PROPOFOL N/A 04/13/2016   Procedure: ESOPHAGOGASTRODUODENOSCOPY (EGD) WITH PROPOFOL;  Surgeon: Gatha Mayer, MD;   Location: [facility];  Service: Endoscopy;  Laterality: N/A;  . PROSTATE SURGERY     radiation therapy only    Current Outpatient Prescriptions  Medication Sig Dispense Refill  . acetaminophen (TYLENOL) 650 MG CR tablet Take 650 mg by mouth every 4 (four) hours as needed for pain.    . Amino Acids-Protein Hydrolys (FEEDING SUPPLEMENT, PRO-STAT SUGAR FREE 64,) LIQD Take 30 mLs by mouth 2 (two) times daily. 900 mL 0  . atenolol (TENORMIN) 25 MG tablet Take 25 mg by mouth daily.    Marland Kitchen atorvastatin (LIPITOR) 10 MG tablet Take 1 tablet (10 mg total) by mouth daily after supper. 90 tablet 0  . docusate sodium (COLACE) 100 MG capsule Take 1 capsule (100 mg total) by mouth 2 (two) times daily. 10 capsule 0  . feeding supplement, GLUCERNA SHAKE, (GLUCERNA SHAKE) LIQD Take 237 mLs by mouth 2 (two) times daily between meals.    . ferrous sulfate 325 (65 FE) MG tablet Take 325 mg by mouth daily.  0  . furosemide (LASIX) 40 MG tablet Take 40 mg by mouth daily.  0  . glipiZIDE (GLUCOTROL XL) 2.5 MG 24 hr tablet Take 1 tablet (2.5 mg total) by mouth daily with breakfast. 30 tablet 3  . glucose blood (ONE TOUCH ULTRA TEST) test strip USE TO TEST BLOOD SUGAR ONCE DAILY Dx code- E11.65 50 each 3  . hydrocortisone (CORTEF) 10 MG tablet Take 1 tablet (10 mg total) by mouth 2 (two) times daily.    Marland Kitchen KLOR-CON M20 20 MEQ tablet Take 20 mEq by mouth daily.  0  . Multiple Vitamin (MULTIVITAMIN WITH MINERALS) TABS tablet Take 1 tablet by mouth daily.    Glory Rosebush DELICA LANCETS 94W MISC USE TO TEST ONCE DAILY Dx code E11.65 100 each 0  . pantoprazole (PROTONIX) 40 MG tablet Take 1 tablet (40 mg total) by mouth 2 (two) times daily.    . pioglitazone (ACTOS) 30 MG tablet TAKE 1 TABLET BY MOUTH EVERY DAY (Patient taking differently: TAKE 33m TABLET BY MOUTH EVERY DAY) 90 tablet 0  . polyethylene glycol (MIRALAX / GLYCOLAX) packet Take 17 g by mouth daily. (Patient taking differently: Take 17 g by mouth every other day.  ) 14 each 0  . TRADJENTA 5 MG TABS tablet TAKE 1 TABLET (5 MG TOTAL) BY MOUTH DAILY. 90 tablet 0  . ZYTIGA 250 MG tablet TAKE 4 TABLETS (1,000MG) BY MOUTH EVERY DAY ON AN EMPTY STOMACH 1 HOURBEFORE OR 2 HOURS AFTER A MEAL 120 tablet 0   No current facility-administered medications for this visit.     Allergies as of 05/22/2016  . (No Known Allergies)    Family History  Problem Relation Age of Onset  . Diabetes Father   . Diabetes Sister   . Hypertension Sister   . Hypertension Brother   . Heart disease Neg Hx     Social History   Social History  . Marital status: Married    Spouse name: N/A  . Number of children: N/A  . Years of education: N/A   Occupational History  . retired    Social History Main Topics  . Smoking status: Former Smoker    Years: 10.00    Quit date: 2000  . Smokeless tobacco: Never Used  . Alcohol use  No  . Drug use: No  . Sexual activity: Not on file   Other Topics Concern  . Not on file   Social History Narrative  . No narrative on file     Physical Exam: Ht _0  (1.702 m) Comment: height measured without shoes  Wt 151 lb 2 oz (68.5 kg)   BMI 23.67 kg/m  Constitutional: Frail elderly man Psychiatric: alert and oriented x3 Abdomen: soft, nontender, nondistended, no obvious ascites, no peritoneal signs, normal bowel sounds No peripheral edema noted in lower extremities  Assessment and plan: 76 y.o. male with advanced achalasia  He is maintaining his nutrition on a full liquid only diet and I explained that he should continue that indefinitely. He has metastatic prostate cancer and is on a blood thinner. He is generally pretty frail. I'm not sure he is a candidate for pneumatic achalasia dilation or whether that would benefit him even if he had it.  I'm going to communicate with Dr. Silverio Decamp about his case to see what she thinks. For now he will stay on full liquid only diet. He will follow-up on an as-needed basis as well with Dr.  Henrene Pastor.  Please see the "Patient Instructions" section for addition details about the plan.  Greater than 50% of this visit was spent in direct face-to-face counseling.  Total time of this visit was 25 min.   Owens Loffler, MD [facility] 05/22/2016, 8:57 AM

## 2016-06-01 ENCOUNTER — Other Ambulatory Visit: Payer: Self-pay | Admitting: Oncology

## 2016-06-01 ENCOUNTER — Telehealth: Payer: Self-pay

## 2016-06-01 DIAGNOSIS — Z8546 Personal history of malignant neoplasm of prostate: Secondary | ICD-10-CM

## 2016-06-01 NOTE — Telephone Encounter (Signed)
No refill for now.

## 2016-06-01 NOTE — Telephone Encounter (Signed)
[facility] Alen Blew said no refill for now.

## 2016-06-01 NOTE — Telephone Encounter (Signed)
[facility]. Pt has not been seen since 01/25/16. His zytiga was stopped 11/7 for other health issues. Then most recent rx in [facility] was 03/19/16.

## 2016-06-04 ENCOUNTER — Ambulatory Visit: Payer: Medicare Other | Admitting: Gastroenterology

## 2016-06-10 ENCOUNTER — Other Ambulatory Visit: Payer: Self-pay | Admitting: Endocrinology

## 2016-06-10 DIAGNOSIS — E1165 Type 2 diabetes mellitus with hyperglycemia: Secondary | ICD-10-CM

## 2016-06-11 ENCOUNTER — Other Ambulatory Visit (INDEPENDENT_AMBULATORY_CARE_PROVIDER_SITE_OTHER): Payer: Medicare Other

## 2016-06-11 DIAGNOSIS — E1165 Type 2 diabetes mellitus with hyperglycemia: Secondary | ICD-10-CM | POA: Diagnosis not present

## 2016-06-11 LAB — BASIC METABOLIC PANEL
BUN: 16 mg/dL (ref 6–23)
CO2: 35 mEq/L — ABNORMAL HIGH (ref 19–32)
CREATININE: 1.7 mg/dL — AB (ref 0.40–1.50)
Calcium: 8.2 mg/dL — ABNORMAL LOW (ref 8.4–10.5)
Chloride: 99 mEq/L (ref 96–112)
GFR: 50.75 mL/min — AB (ref >60.00)
GLUCOSE: 148 mg/dL — AB (ref 70–99)
Potassium: 3.5 mEq/L (ref 3.5–5.1)
Sodium: 142 mEq/L (ref 135–145)

## 2016-06-12 LAB — FRUCTOSAMINE: Fructosamine: 335 umol/L — ABNORMAL HIGH (ref 0–285)

## 2016-06-14 ENCOUNTER — Encounter: Payer: Self-pay | Admitting: Endocrinology

## 2016-06-14 ENCOUNTER — Ambulatory Visit (INDEPENDENT_AMBULATORY_CARE_PROVIDER_SITE_OTHER): Payer: Medicare Other | Admitting: Endocrinology

## 2016-06-14 VITALS — BP 138/70 | HR 82 | Ht 67.0 in | Wt 156.0 lb

## 2016-06-14 DIAGNOSIS — E1165 Type 2 diabetes mellitus with hyperglycemia: Secondary | ICD-10-CM

## 2016-06-14 LAB — MICROALBUMIN / CREATININE URINE RATIO
Creatinine,U: 135.6 mg/dL
Microalb Creat Ratio: 1.8 mg/g (ref 0.0–30.0)
Microalb, Ur: 2.5 mg/dL — ABNORMAL HIGH (ref 0.0–1.9)

## 2016-06-14 NOTE — Patient Instructions (Signed)
Start reducing HYDROCORTISONE as follows:  Until 06/21/16 take 1 tablet in the morning and half in the evening Starting 06/22/16 take only 1 tablet in the morning Starting 06/29/16 reduce the morning tablet to half Stop taking hydrocortisone at the end of the month  Cut back on the amounts of juices and sweet tea May have Glucerna, yogurt and other drinks with protein  Please set up appointment with a primary care physician

## 2016-06-14 NOTE — Progress Notes (Signed)
Patient ID: [name], male   DOB: 05-17-1940, 76 y.o.   MRN: [id number]   Reason for Appointment : Followup  History of Present Illness          Diagnosis: Type 2 diabetes mellitus, date of diagnosis:   2001     Past history: He has had long-standing diabetes treated with various oral hypoglycemic drugs.  He has not been on metformin because of renal dysfunction He thinks he was given Actos for sometime but not clear if he was benefiting. This was stopped probably in 2012 but he does not think he had any side effects. He thinks he has been taking Tradjenta for a couple of years  His A1c has been monitored about once or twice a year as per records  Because of worsening A1c in 12/14 he was referred for further evaluation. Because of baseline A1c of 8.6% in 12/14 he was started on glipizide ER 5 mg and pioglitazone 30 mg daily in addition to his Tradjenta He was also started on home glucose monitoring with a One Touch monitor  Recent history:   He is taking a 2 drug regimen of glipizide ER 2.5 mg, Tradjenta   His A1c is relatively better at 5.9 as of 1/18 However fructosamine is high at 335  His last visit was in 1/18    Current management, blood sugar patterns and problems identified:  He has not checked his blood sugars since his last visit and is not able to come around to doing this.  Also his previous test strips were expired.  Because of edema his Actos was stopped and glipizide started as he was having relatively high blood sugars at times, was 201 in the office  His weight has been variable  He is on a liquid/soft diet and is drinking juice frequently and occasionally some sweet tea during the day, also his wife is helping him with making smoothies with either fruits or yogurt, having some soup also, does not have any proper diet made and is supposed to continue non--solid food  Glucose monitoring:   using a  One Touch ultra mini monitor   Hypoglycemia:  None    Self-care: The diet that the patient has been following is: None  Meals: 3 meals per day. Still has some sweet tea and juices such as grape or apple juice  Exercise:  none recently Dietician visit:  1/16.  CDE visit 5/15             Weight history:  Wt Readings from Last 3 Encounters:  06/14/16 156 lb (70.8 kg)  05/22/16 151 lb 2 oz (68.5 kg)  04/16/16 160 lb (72.6 kg)   Glycemic control:   Lab Results  Component Value Date   HGBA1C 5.9 04/10/2016   HGBA1C 6.1 (H) 03/14/2016   HGBA1C 7.1 (H) 12/21/2015   Lab Results  Component Value Date   MICROALBUR <0.7 08/23/2015   LDLCALC 75 12/21/2015   CREATININE 1.70 (H) 06/11/2016    Lab Results  Component Value Date   FRUCTOSAMINE 335 (H) 06/11/2016   FRUCTOSAMINE 299 (H) 12/23/2014   FRUCTOSAMINE 364 (H) 04/21/2014   FRUCTOSAMINE 310 (H) 05/08/2013      Allergies as of 06/14/2016   No Known Allergies     Medication List       Accurate as of 06/14/16 10:54 AM. Always use your most recent med list.          acetaminophen 650 MG CR tablet  Commonly known as:  TYLENOL Take 650 mg by mouth every 4 (four) hours as needed for pain.   atenolol 25 MG tablet Commonly known as:  TENORMIN Take 25 mg by mouth daily.   atorvastatin 10 MG tablet Commonly known as:  LIPITOR Take 1 tablet (10 mg total) by mouth daily after supper.   docusate sodium 100 MG capsule Commonly known as:  COLACE Take 1 capsule (100 mg total) by mouth 2 (two) times daily.   feeding supplement (GLUCERNA SHAKE) Liqd Take 237 mLs by mouth 2 (two) times daily between meals.   feeding supplement (PRO-STAT SUGAR FREE 64) Liqd Take 30 mLs by mouth 2 (two) times daily.   ferrous sulfate 325 (65 FE) MG tablet Take 325 mg by mouth daily.   furosemide 40 MG tablet Commonly known as:  LASIX Take 40 mg by mouth daily.   glipiZIDE 2.5 MG 24 hr tablet Commonly known as:  GLUCOTROL XL Take 1 tablet (2.5 mg total) by mouth daily with  breakfast.   glucose blood test strip Commonly known as:  ONE TOUCH ULTRA TEST USE TO TEST BLOOD SUGAR ONCE DAILY Dx code- E11.65   hydrocortisone 10 MG tablet Commonly known as:  CORTEF Take 1 tablet (10 mg total) by mouth 2 (two) times daily.   KLOR-CON M20 20 MEQ tablet Generic drug:  potassium chloride SA Take 20 mEq by mouth daily.   multivitamin with minerals Tabs tablet Take 1 tablet by mouth daily.   ONETOUCH DELICA LANCETS 67E Misc USE TO TEST ONCE DAILY Dx code E11.65   pantoprazole 40 MG tablet Commonly known as:  PROTONIX Take 1 tablet (40 mg total) by mouth 2 (two) times daily.   pioglitazone 30 MG tablet Commonly known as:  ACTOS TAKE 1 TABLET BY MOUTH EVERY DAY   polyethylene glycol packet Commonly known as:  MIRALAX / GLYCOLAX Take 17 g by mouth daily.   TRADJENTA 5 MG Tabs tablet Generic drug:  linagliptin TAKE 1 TABLET (5 MG TOTAL) BY MOUTH DAILY.   ZYTIGA 250 MG tablet Generic drug:  abiraterone Acetate TAKE 4 TABLETS (1,000MG ) BY MOUTH EVERY DAY ON AN EMPTY STOMACH 1 HOURBEFORE OR 2 HOURS AFTER A MEAL       Allergies: No Known Allergies  Past Medical History:  Diagnosis Date  . Cataract   . CKD (chronic kidney disease) 02/2016  . Diabetes mellitus, type 2 ([place]) 2008  . Hyperlipidemia 2008  . Hypertension 2008  . Prostate cancer ([place]) 2006    Past Surgical History:  Procedure Laterality Date  . BALLOON DILATION N/A 04/13/2016   Procedure: BALLOON DILATION;  Surgeon: Gatha Mayer, MD;  Location: [facility] ENDOSCOPY;  Service: Endoscopy;  Laterality: N/A;  . BOTOX INJECTION N/A 04/13/2016   Procedure: BOTOX INJECTION;  Surgeon: Gatha Mayer, MD;  Location: [place];  Service: Endoscopy;  Laterality: N/A;  . COLONOSCOPY W/ POLYPECTOMY  03/2008   diminutive rectal polyp (path: prolapse type polyp, not adenoma).  moderate pan-diverticulitis.   Marland Kitchen ESOPHAGEAL MANOMETRY  1984   unable to pass manometry catheter beyond LES. Aperiystalsis of  esophagus: sugg of achalasia.   Marland Kitchen ESOPHAGOGASTRODUODENOSCOPY N/A 02/17/2016   Procedure: ESOPHAGOGASTRODUODENOSCOPY (EGD);  Surgeon: [name], MD;  Location: [facility];  Service: Endoscopy;  Laterality: N/A;  . ESOPHAGOGASTRODUODENOSCOPY (EGD) WITH PROPOFOL N/A 02/21/2016   Procedure: ESOPHAGOGASTRODUODENOSCOPY (EGD) WITH PROPOFOL;  Surgeon: Jerene Bears, MD;  Location: [facility] ENDOSCOPY;  Service: Gastroenterology;  Laterality: N/A;  . ESOPHAGOGASTRODUODENOSCOPY (EGD) WITH  PROPOFOL N/A 02/28/2016   Procedure: ESOPHAGOGASTRODUODENOSCOPY (EGD) WITH PROPOFOL withBOTOX;  Surgeon: Manus Gunning, MD;  Location: [facility] ENDOSCOPY;  Service: Gastroenterology;  Laterality: N/A;  . ESOPHAGOGASTRODUODENOSCOPY (EGD) WITH PROPOFOL N/A 04/13/2016   Procedure: ESOPHAGOGASTRODUODENOSCOPY (EGD) WITH PROPOFOL;  Surgeon: Gatha Mayer, MD;  Location: [place];  Service: Endoscopy;  Laterality: N/A;  . PROSTATE SURGERY     radiation therapy only    Family History  Problem Relation Age of Onset  . Diabetes Father   . Diabetes Sister   . Hypertension Sister   . Hypertension Brother   . Heart disease Neg Hx     Social History:  reports that he quit smoking about 18 years ago. He quit after 10.00 years of use. He has never used smokeless tobacco. He reports that he does not drink alcohol or use drugs.    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 He has been told that he has a cataract since he was young                The blood pressure has been Followed by his nephrologist Only on atenolol currently   Followed by nephrologist Dr. Florene Glen for his chronic kidney disease Also on Lasix daily Renal  function stable  Not clear why he was placed on hydrocortisone in November at the hospital, cortisol levels were borderline and he had issues with dehydration.  Currently still taking 10 mg twice a day    LABS:  Lab on 06/11/2016  Component Date Value Ref Range Status  . Fructosamine 06/11/2016 335* 0 - 285 umol/L Final   Comment: Published reference interval for apparently healthy subjects between age 67 and 16 is 44 - 285 umol/L and in a poorly controlled diabetic population is 228 - 563 umol/L with a mean of 396 umol/L.   Marland Kitchen Sodium 06/11/2016 142  135 - 145 mEq/L Final  . Potassium 06/11/2016 3.5  3.5 - 5.1 mEq/L Final  . Chloride 06/11/2016 99  96 - 112 mEq/L Final  . CO2 06/11/2016 35* 19 - 32 mEq/L Final  . Glucose, Bld 06/11/2016 148* 70 - 99 mg/dL Final  . BUN 06/11/2016 16  6 - 23 mg/dL Final  . Creatinine, Ser 06/11/2016 1.70* 0.40 - 1.50 mg/dL Final  . Calcium 06/11/2016 8.2* 8.4 - 10.5 mg/dL Final  . GFR 06/11/2016 50.75* >60.00 mL/min Final    Physical Examination:  BP 138/70   Pulse 82   Ht 5\' 7"  (1.702 m)   Wt 156 lb (70.8 kg)   SpO2 98%   BMI 24.43 kg/m    Trace ankle edema present  ASSESSMENT/PLAN:  1. Diabetes type 2, nonobese See history of present illness for discussion of current diabetes management, blood sugar patterns and problems identified  Although his fructosamine is relatively high and not clear when he is having high readings as he is not  monitoring at home Fasting reading 148 in the lab Considering his age his level of control is adequate A1c has been falsely low   Recommendations:  He will continue the same  medications with glipizide and Tradjenta  Check blood sugars consistently every day   2. Renal insufficiency with stable creatinine, he needs to follow-up with his nephrologist   3.  Hypertension:  Control has been good  4.  Hypercholesterolemia: He is taking 10 mg Lipitor with control   5.?  Adrenal insufficiency: No  clear-cut diagnosis made last October and not clear why he is on hydrocortisone, we will start tapering this off as his blood pressure is relatively good and he had no other factors that would make him have adrenal insufficiency Tapering schedule given   Patient Instructions  Start reducing HYDROCORTISONE as follows:  Until 06/21/16 take 1 tablet in the morning and half in the evening Starting 06/22/16 take only 1 tablet in the morning Starting 06/29/16 reduce the morning tablet to half Stop taking hydrocortisone at the end of the month  Cut back on the amounts of juices and sweet tea May have Glucerna, yogurt and other drinks with protein  Please set up appointment with a primary care physician       [facility] 06/14/2016, 10:54 AM   Note: This office note was prepared with Dragon voice recognition system technology. Any transcriptional errors that result from this process are unintentional.

## 2016-06-21 DIAGNOSIS — N183 Chronic kidney disease, stage 3 (moderate): Secondary | ICD-10-CM | POA: Diagnosis not present

## 2016-06-21 DIAGNOSIS — K220 Achalasia of cardia: Secondary | ICD-10-CM | POA: Diagnosis not present

## 2016-06-21 DIAGNOSIS — C61 Malignant neoplasm of prostate: Secondary | ICD-10-CM | POA: Diagnosis not present

## 2016-06-21 DIAGNOSIS — R634 Abnormal weight loss: Secondary | ICD-10-CM | POA: Diagnosis not present

## 2016-06-21 DIAGNOSIS — I10 Essential (primary) hypertension: Secondary | ICD-10-CM | POA: Diagnosis not present

## 2016-06-21 DIAGNOSIS — E119 Type 2 diabetes mellitus without complications: Secondary | ICD-10-CM | POA: Diagnosis not present

## 2016-06-27 ENCOUNTER — Other Ambulatory Visit: Payer: Self-pay | Admitting: Endocrinology

## 2016-06-28 ENCOUNTER — Other Ambulatory Visit: Payer: Self-pay | Admitting: Endocrinology

## 2016-07-04 ENCOUNTER — Other Ambulatory Visit: Payer: Self-pay

## 2016-07-04 ENCOUNTER — Other Ambulatory Visit: Payer: Self-pay | Admitting: Endocrinology

## 2016-07-04 MED ORDER — ATENOLOL 25 MG PO TABS: 25.0000 mg | ORAL_TABLET | Freq: Every day | ORAL | 1 refills | Status: DC

## 2016-07-04 MED ORDER — ATORVASTATIN CALCIUM 10 MG PO TABS: 10.0000 mg | ORAL_TABLET | Freq: Every day | ORAL | 1 refills | Status: DC

## 2016-07-04 MED ORDER — PANTOPRAZOLE SODIUM 40 MG PO TBEC: 40.0000 mg | DELAYED_RELEASE_TABLET | Freq: Two times a day (BID) | ORAL | 1 refills | Status: DC

## 2016-07-09 ENCOUNTER — Other Ambulatory Visit: Payer: Self-pay

## 2016-07-09 MED ORDER — PANTOPRAZOLE SODIUM 40 MG PO TBEC: 40.0000 mg | DELAYED_RELEASE_TABLET | Freq: Two times a day (BID) | ORAL | 1 refills | Status: DC

## 2016-07-09 MED ORDER — ATORVASTATIN CALCIUM 10 MG PO TABS: 10.0000 mg | ORAL_TABLET | Freq: Every day | ORAL | 1 refills | Status: AC

## 2016-07-09 MED ORDER — ATENOLOL 25 MG PO TABS: 25.0000 mg | ORAL_TABLET | Freq: Every day | ORAL | 1 refills | Status: DC

## 2016-07-24 ENCOUNTER — Other Ambulatory Visit: Payer: Self-pay | Admitting: Endocrinology

## 2016-07-24 ENCOUNTER — Ambulatory Visit (INDEPENDENT_AMBULATORY_CARE_PROVIDER_SITE_OTHER): Payer: Medicare Other | Admitting: Family

## 2016-07-24 ENCOUNTER — Encounter: Payer: Self-pay | Admitting: Family

## 2016-07-24 VITALS — BP 136/74 | HR 70 | Temp 98.0°F | Resp 16 | Ht 67.0 in | Wt 135.0 lb

## 2016-07-24 DIAGNOSIS — C61 Malignant neoplasm of prostate: Secondary | ICD-10-CM | POA: Diagnosis not present

## 2016-07-24 DIAGNOSIS — Z8546 Personal history of malignant neoplasm of prostate: Secondary | ICD-10-CM | POA: Diagnosis not present

## 2016-07-24 DIAGNOSIS — E1169 Type 2 diabetes mellitus with other specified complication: Secondary | ICD-10-CM

## 2016-07-24 DIAGNOSIS — I10 Essential (primary) hypertension: Secondary | ICD-10-CM | POA: Diagnosis not present

## 2016-07-24 DIAGNOSIS — N183 Chronic kidney disease, stage 3 unspecified: Secondary | ICD-10-CM

## 2016-07-24 DIAGNOSIS — E785 Hyperlipidemia, unspecified: Secondary | ICD-10-CM

## 2016-07-24 DIAGNOSIS — K220 Achalasia of cardia: Secondary | ICD-10-CM | POA: Diagnosis not present

## 2016-07-24 DIAGNOSIS — E119 Type 2 diabetes mellitus without complications: Secondary | ICD-10-CM | POA: Diagnosis not present

## 2016-07-24 DIAGNOSIS — R634 Abnormal weight loss: Secondary | ICD-10-CM | POA: Diagnosis not present

## 2016-07-24 MED ORDER — ACCU-CHEK FASTCLIX LANCET KIT: PACK | 0 refills | Status: DC

## 2016-07-24 NOTE — Assessment & Plan Note (Signed)
Stable with current dosage of atorvastatin and no adverse side effects. Continue to monitor.

## 2016-07-24 NOTE — Patient Instructions (Addendum)
Thank you for choosing [facility].  SUMMARY AND INSTRUCTIONS:  Please continue to take your medications as prescribed.  If you have medicare related insurance (such as traditional Medicare, [employer], [employer], or similar), Please make an appointment at the scheduling desk with Sharee Pimple, the [employer], for your Wellness visit in this office, which is a benefit with your insurance.  Follow up in 6 months or sooner if needed.  Medication:  Your prescription(s) have been submitted to your pharmacy or been printed and provided for you. Please take as directed and contact our office if you believe you are having problem(s) with the medication(s) or have any questions.  Follow up:  If your symptoms worsen or fail to improve, please contact our office for further instruction, or in case of emergency go directly to the emergency room at the closest medical facility.

## 2016-07-24 NOTE — Assessment & Plan Note (Signed)
Blood pressure appears adequately controlled with current medication and no adverse side effects. Denies worse headache of life with no symptoms of end organ damage noted on physical exam. Continue current dosage of atenolol. Encouraged to monitor blood pressure at home and follow low-sodium diet. Continue to monitor.

## 2016-07-24 NOTE — Assessment & Plan Note (Addendum)
Stable and managed by Dr. Florene Glen of nephrology. Changes and follow up per nephrology. Continue to monitor.

## 2016-07-24 NOTE — Progress Notes (Signed)
Subjective:    Patient ID: [name], male    DOB: 1940/07/01, 76 y.o.   MRN: [id number]  Chief Complaint  Patient presents with  . Establish Care    HPI:  [name] is a 76 y.o. male who  has a past medical history of Cataract; CKD (chronic kidney disease) (02/2016); Diabetes mellitus, type 2 ([place]) (2008); Hyperlipidemia (2008); Hypertension (2008); and Prostate cancer ([place]) (2006). and presents today for an office visit to establish care.   1.)  Hypertension - Currently maintained on atenolol and furosemide. Reports taking the medication as prescribed and denies adverse side effects. Blood pressure. Denies worst headache of life with no symptoms of end organ damage. Blood pressure earlier today was within the normal limits.   BP Readings from Last 3 Encounters:  07/24/16 136/74  06/14/16 138/70  05/22/16 120/70    No Known Allergies    Outpatient Medications Prior to Visit  Medication Sig Dispense Refill  . acetaminophen (TYLENOL) 650 MG CR tablet Take 650 mg by mouth every 4 (four) hours as needed for pain.    . Amino Acids-Protein Hydrolys (FEEDING SUPPLEMENT, PRO-STAT SUGAR FREE 64,) LIQD Take 30 mLs by mouth 2 (two) times daily. 900 mL 0  . atenolol (TENORMIN) 25 MG tablet Take 1 tablet (25 mg total) by mouth daily. 90 tablet 1  . atorvastatin (LIPITOR) 10 MG tablet Take 1 tablet (10 mg total) by mouth at bedtime. 90 tablet 1  . docusate sodium (COLACE) 100 MG capsule TAKE ONE CAPSULE BY MOUTH TWICE A DAY FOR CONSTIPATION 60 capsule 0  . feeding supplement, GLUCERNA SHAKE, (GLUCERNA SHAKE) LIQD Take 237 mLs by mouth 2 (two) times daily between meals.    . ferrous sulfate 325 (65 FE) MG tablet TAKE 1 TABLET BY MOUTH EVERY DAY FOR SUPPLEMENT 30 tablet 0  . furosemide (LASIX) 40 MG tablet Take 40 mg by mouth daily.  0  . glipiZIDE (GLUCOTROL XL) 2.5 MG 24 hr tablet Take 1 tablet (2.5 mg total) by mouth daily with breakfast. 30 tablet 3  . glucose blood (ONE  TOUCH ULTRA TEST) test strip USE TO TEST BLOOD SUGAR ONCE DAILY Dx code- E11.65 50 each 3  . hydrocortisone (CORTEF) 10 MG tablet Take 1 tablet (10 mg total) by mouth 2 (two) times daily.    Marland Kitchen KLOR-CON M20 20 MEQ tablet Take 20 mEq by mouth daily.  0  . Multiple Vitamin (MULTIVITAMIN WITH MINERALS) TABS tablet Take 1 tablet by mouth daily.    Glory Rosebush DELICA LANCETS 46K MISC USE TO TEST ONCE DAILY Dx code E11.65 100 each 0  . pantoprazole (PROTONIX) 40 MG tablet Take 1 tablet (40 mg total) by mouth 2 (two) times daily. 180 tablet 1  . pioglitazone (ACTOS) 30 MG tablet TAKE 1 TABLET BY MOUTH EVERY DAY (Patient not taking: Reported on 06/14/2016) 90 tablet 0  . polyethylene glycol (MIRALAX / GLYCOLAX) packet Take 17 g by mouth daily. (Patient taking differently: Take 17 g by mouth every other day. ) 14 each 0  . TRADJENTA 5 MG TABS tablet TAKE 1 TABLET (5 MG TOTAL) BY MOUTH DAILY. 90 tablet 0  . ZYTIGA 250 MG tablet TAKE 4 TABLETS (1,000MG) BY MOUTH EVERY DAY ON AN EMPTY STOMACH 1 HOURBEFORE OR 2 HOURS AFTER A MEAL 120 tablet 0   No facility-administered medications prior to visit.       Past Surgical History:  Procedure Laterality Date  . BALLOON DILATION N/A 04/13/2016  Procedure: BALLOON DILATION;  Surgeon: Gatha Mayer, MD;  Location: [facility] ENDOSCOPY;  Service: Endoscopy;  Laterality: N/A;  . BOTOX INJECTION N/A 04/13/2016   Procedure: BOTOX INJECTION;  Surgeon: Gatha Mayer, MD;  Location: [place];  Service: Endoscopy;  Laterality: N/A;  . COLONOSCOPY W/ POLYPECTOMY  03/2008   diminutive rectal polyp (path: prolapse type polyp, not adenoma).  moderate pan-diverticulitis.   Marland Kitchen ESOPHAGEAL MANOMETRY  1984   unable to pass manometry catheter beyond LES. Aperiystalsis of esophagus: sugg of achalasia.   Marland Kitchen ESOPHAGOGASTRODUODENOSCOPY N/A 02/17/2016   Procedure: ESOPHAGOGASTRODUODENOSCOPY (EGD);  Surgeon: [name], MD;  Location: [facility];  Service: Endoscopy;  Laterality: N/A;  .  ESOPHAGOGASTRODUODENOSCOPY (EGD) WITH PROPOFOL N/A 02/21/2016   Procedure: ESOPHAGOGASTRODUODENOSCOPY (EGD) WITH PROPOFOL;  Surgeon: Jerene Bears, MD;  Location: [facility] ENDOSCOPY;  Service: Gastroenterology;  Laterality: N/A;  . ESOPHAGOGASTRODUODENOSCOPY (EGD) WITH PROPOFOL N/A 02/28/2016   Procedure: ESOPHAGOGASTRODUODENOSCOPY (EGD) WITH PROPOFOL withBOTOX;  Surgeon: Manus Gunning, MD;  Location: [facility] ENDOSCOPY;  Service: Gastroenterology;  Laterality: N/A;  . ESOPHAGOGASTRODUODENOSCOPY (EGD) WITH PROPOFOL N/A 04/13/2016   Procedure: ESOPHAGOGASTRODUODENOSCOPY (EGD) WITH PROPOFOL;  Surgeon: Gatha Mayer, MD;  Location: [place];  Service: Endoscopy;  Laterality: N/A;  . PROSTATE SURGERY     radiation therapy only      Past Medical History:  Diagnosis Date  . Cataract   . CKD (chronic kidney disease) 02/2016  . Diabetes mellitus, type 2 ([place]) 2008  . Hyperlipidemia 2008  . Hypertension 2008  . Prostate cancer [facility]) 2006    Review of Systems  Constitutional: Negative for chills and fever.  Eyes:       Negative for changes in vision  Respiratory: Negative for cough, chest tightness and wheezing.   Cardiovascular: Negative for chest pain, palpitations and leg swelling.  Neurological: Negative for dizziness, weakness and light-headedness.      Objective:    BP 136/74 (BP Location: Left Arm, Patient Position: Sitting, Cuff Size: Normal)   Pulse 70   Temp 98 F (36.7 C) (Oral)   Resp 16   Ht _0  (1.702 m)   Wt 135 lb (61.2 kg)   SpO2 98%   BMI 21.14 kg/m  Nursing note and vital signs reviewed.  Physical Exam  Constitutional: He is oriented to person, place, and time. He appears well-developed and well-nourished. No distress.  Cardiovascular: Normal rate, regular rhythm, normal heart sounds and intact distal pulses.   Pulmonary/Chest: Effort normal and breath sounds normal.  Neurological: He is alert and oriented to person, place, and time.  Skin: Skin is warm and  dry.  Psychiatric: He has a normal mood and affect. His behavior is normal. Judgment and thought content normal.       Assessment & Plan:   Problem List Items Addressed This Visit      Cardiovascular and Mediastinum   Essential hypertension - Primary    Blood pressure appears adequately controlled with current medication and no adverse side effects. Denies worse headache of life with no symptoms of end organ damage noted on physical exam. Continue current dosage of atenolol. Encouraged to monitor blood pressure at home and follow low-sodium diet. Continue to monitor.        Endocrine   Hyperlipidemia associated with type 2 diabetes mellitus ([place])    Stable with current dosage of atorvastatin and no adverse side effects. Continue to monitor.         Genitourinary   Chronic kidney disease, stage 3  Stable and managed by Dr. Florene Glen of nephrology. Changes and follow up per nephrology. Continue to monitor.         Other   PROSTATE CANCER, HX OF    Stable and monitored by Dr. Kalman Shan. Follow up and changes per urology.          I have discontinued Mr. Johne Buckle. I have also changed his ACCU-CHEK FASTCLIX LANCET. Additionally, I am having him maintain his pioglitazone, ONETOUCH DELICA LANCETS 48G, polyethylene glycol, feeding supplement (Qulin), acetaminophen, hydrocortisone, multivitamin with minerals, furosemide, KLOR-CON M20, feeding supplement (PRO-STAT SUGAR FREE 64), glipiZIDE, glucose blood, TRADJENTA, ferrous sulfate, docusate sodium, atorvastatin, pantoprazole, and atenolol.   Meds ordered this encounter  Medications  . DISCONTD: Lancets Misc. (ACCU-CHEK FASTCLIX LANCET) KIT    Sig: by Does not apply route daily.  . Lancets Misc. (ACCU-CHEK FASTCLIX LANCET) KIT    Sig: Use to check blood sugar once daily E11.65    Dispense:  1 kit    Refill:  0     Follow-up: Return in about 3 months (around 10/23/2016), or if symptoms worsen or fail to  improve.  Mauricio Po, FNP

## 2016-07-24 NOTE — Assessment & Plan Note (Signed)
Stable and monitored by Dr. Kalman Shan. Follow up and changes per urology.

## 2016-08-20 ENCOUNTER — Other Ambulatory Visit: Payer: Self-pay

## 2016-08-20 MED ORDER — FERROUS SULFATE 325 (65 FE) MG PO TABS: ORAL_TABLET | ORAL | 0 refills | Status: DC

## 2016-08-24 ENCOUNTER — Other Ambulatory Visit (INDEPENDENT_AMBULATORY_CARE_PROVIDER_SITE_OTHER): Payer: Medicare Other

## 2016-08-24 DIAGNOSIS — E1165 Type 2 diabetes mellitus with hyperglycemia: Secondary | ICD-10-CM | POA: Diagnosis not present

## 2016-08-24 LAB — COMPREHENSIVE METABOLIC PANEL
ALBUMIN: 3.6 g/dL (ref 3.5–5.2)
ALK PHOS: 111 U/L (ref 39–117)
ALT: 7 U/L (ref 0–53)
AST: 15 U/L (ref 0–37)
BUN: 33 mg/dL — AB (ref 6–23)
CO2: 28 mEq/L (ref 19–32)
Calcium: 9.3 mg/dL (ref 8.4–10.5)
Chloride: 104 mEq/L (ref 96–112)
Creatinine, Ser: 2.27 mg/dL — ABNORMAL HIGH (ref 0.40–1.50)
GFR: 36.33 mL/min — ABNORMAL LOW (ref >60.00)
Glucose, Bld: 136 mg/dL — ABNORMAL HIGH (ref 70–99)
Potassium: 4.9 mEq/L (ref 3.5–5.1)
SODIUM: 139 meq/L (ref 135–145)
TOTAL PROTEIN: 6.6 g/dL (ref 6.0–8.3)
Total Bilirubin: 0.3 mg/dL (ref 0.2–1.2)

## 2016-08-24 LAB — LIPID PANEL
CHOLESTEROL: 137 mg/dL (ref 0–200)
HDL: 48.9 mg/dL (ref >39.00)
LDL CALC: 63 mg/dL (ref 0–99)
NONHDL: 88.27
Total CHOL/HDL Ratio: 3
Triglycerides: 125 mg/dL (ref 0.0–149.0)
VLDL: 25 mg/dL (ref 0.0–40.0)

## 2016-08-24 LAB — HEMOGLOBIN A1C: Hgb A1c MFr Bld: 6.8 % — ABNORMAL HIGH (ref 4.6–6.5)

## 2016-08-28 NOTE — Progress Notes (Signed)
Patient ID: [name], male   DOB: [birth year], 76 y.o.   MRN: [id number]   Reason for Appointment : Followup  History of Present Illness          Diagnosis: Type 2 diabetes mellitus, date of diagnosis:   2001     Past history: [name] has had long-standing diabetes treated with various oral hypoglycemic drugs.  [name] has not been on metformin because of renal dysfunction [name] thinks [name] was given Actos for sometime but not clear if [name] was benefiting. This was stopped probably in 2012 but [name] does not think [name] had any side effects. [name] thinks [name] has been taking Tradjenta for a couple of years  His A1c has been monitored about once or twice a year as per records  Because of worsening A1c in 12/14 [name] was referred for further evaluation. Because of baseline A1c of 8.6% in 12/14 [name] was started on glipizide ER 5 mg and pioglitazone 30 mg daily in addition to his Tradjenta [name] was also started on home glucose monitoring with a One Touch monitor  Recent history:   [name] is taking a 2 drug regimen of glipizide ER 2.5 mg, Tradjenta   His A1c is 6.8, previously 5.9 and tends to be lower than expected for his blood sugars Previously fructosamine was 335    Current management, blood sugar patterns and problems identified:  [name] has  checked his blood sugars mostly in the mornings and more frequently than usual, his test strips are not expired.  [name] says his blood sugars are high during the morning hours when [name] wakes up because of eating late at night and sometimes during the night even though when [name] came for his labs at 9 AM his blood sugar was down at 136  A1c usually is lower than expected for his blood sugars which are averaging about 190 at home recently  Because of edema his Actos was stopped previously and glipizide started   His weight has come down drastically  [name] is on a liquid/soft diet and is drinking apple juice and occasionally some sweet tea during the day, also his wife is  helping him with making smoothies with either fruits or yogurt, having some soup and Ensure, still has no formal diet  Glucose monitoring:   using a  One Touch ultra mini monitor which was reviewed today  Recent fasting blood sugar range 153-223 with only one reading at 7 PM of 318  Hypoglycemia: None    Self-care: The diet that the patient has been following is: None  Meals: 3 meals per day. Still has some sweet tea and juices such as grape or apple juice  Exercise:  some upper body exercises  Dietician visit:  1/16.  CDE visit 5/15             Weight history:  Wt Readings from Last 3 Encounters:  08/29/16 135 lb (61.2 kg)  07/24/16 135 lb (61.2 kg)  06/14/16 156 lb (70.8 kg)   Glycemic control:   Lab Results  Component Value Date   HGBA1C 6.8 (H) 08/24/2016   HGBA1C 5.9 04/10/2016   HGBA1C 6.1 (H) 03/14/2016   Lab Results  Component Value Date   MICROALBUR 2.5 (H) 06/14/2016   LDLCALC 63 08/24/2016   CREATININE 2.27 (H) 08/24/2016    Lab Results  Component Value Date   FRUCTOSAMINE 335 (H) 06/11/2016   FRUCTOSAMINE 299 (H) 12/23/2014   FRUCTOSAMINE 364 (H) 04/21/2014   FRUCTOSAMINE  310 (H) 05/08/2013      Allergies as of 08/29/2016   No Known Allergies     Medication List       Accurate as of 08/29/16  9:04 AM. Always use your most recent med list.          ACCU-CHEK FASTCLIX LANCET Kit Use to check blood sugar once daily E11.65   acetaminophen 650 MG CR tablet Commonly known as:  TYLENOL Take 650 mg by mouth every 4 (four) hours as needed for pain.   atenolol 25 MG tablet Commonly known as:  TENORMIN Take 1 tablet (25 mg total) by mouth daily.   atorvastatin 10 MG tablet Commonly known as:  LIPITOR Take 1 tablet (10 mg total) by mouth at bedtime.   docusate sodium 100 MG capsule Commonly known as:  COLACE TAKE ONE CAPSULE BY MOUTH TWICE A DAY FOR CONSTIPATION   feeding supplement (GLUCERNA SHAKE) Liqd Take 237 mLs by mouth 2 (two) times  daily between meals.   feeding supplement (PRO-STAT SUGAR FREE 64) Liqd Take 30 mLs by mouth 2 (two) times daily.   ferrous sulfate 325 (65 FE) MG tablet TAKE 1 TABLET BY MOUTH EVERY DAY FOR SUPPLEMENT   furosemide 40 MG tablet Commonly known as:  LASIX Take 40 mg by mouth daily.   glipiZIDE 2.5 MG 24 hr tablet Commonly known as:  GLUCOTROL XL Take 1 tablet (2.5 mg total) by mouth daily with breakfast.   glucose blood test strip Commonly known as:  ONE TOUCH ULTRA TEST USE TO TEST BLOOD SUGAR ONCE DAILY Dx code- E11.65   KLOR-CON M20 20 MEQ tablet Generic drug:  potassium chloride SA Take 20 mEq by mouth daily.   multivitamin with minerals Tabs tablet Take 1 tablet by mouth daily.   ONETOUCH DELICA LANCETS 92E Misc USE TO TEST ONCE DAILY Dx code E11.65   pantoprazole 40 MG tablet Commonly known as:  PROTONIX Take 1 tablet (40 mg total) by mouth 2 (two) times daily.   pioglitazone 15 MG tablet Commonly known as:  ACTOS Take 1 tablet (15 mg total) by mouth daily.   polyethylene glycol packet Commonly known as:  MIRALAX / GLYCOLAX Take 17 g by mouth daily.   TRADJENTA 5 MG Tabs tablet Generic drug:  linagliptin TAKE 1 TABLET (5 MG TOTAL) BY MOUTH DAILY.       Allergies: No Known Allergies  Past Medical History:  Diagnosis Date  . Cataract   . CKD (chronic kidney disease) 02/2016  . Diabetes mellitus, type 2 ([place]) 2008  . Hyperlipidemia 2008  . Hypertension 2008  . Prostate cancer ([place]) 2006    Past Surgical History:  Procedure Laterality Date  . BALLOON DILATION N/A 04/13/2016   Procedure: BALLOON DILATION;  Surgeon: Gatha Mayer, MD;  Location: [facility] ENDOSCOPY;  Service: Endoscopy;  Laterality: N/A;  . BOTOX INJECTION N/A 04/13/2016   Procedure: BOTOX INJECTION;  Surgeon: Gatha Mayer, MD;  Location: [place];  Service: Endoscopy;  Laterality: N/A;  . COLONOSCOPY W/ POLYPECTOMY  03/2008   diminutive rectal polyp (path: prolapse type polyp, not  adenoma).  moderate pan-diverticulitis.   [facility] Kitchen ESOPHAGEAL MANOMETRY  1984   unable to pass manometry catheter beyond LES. Aperiystalsis of esophagus: sugg of achalasia.   Marland Kitchen ESOPHAGOGASTRODUODENOSCOPY N/A 02/17/2016   Procedure: ESOPHAGOGASTRODUODENOSCOPY (EGD);  Surgeon: [name], MD;  Location: [facility] ENDOS[facility];  Service: Endoscopy;  Laterality: N/A;  . ESOPHAGOGASTRODUODENOSCOPY (EGD) WITH PROPOFOL N/A 02/21/2016   Procedure: ESOPHAGOGASTRODUODENOSCOPY (EGD) WITH  PROPOFOL;  Surgeon: Jerene Bears, MD;  Location: [facility] ENDOSCOPY;  Service: Gastroenterology;  Laterality: N/A;  . ESOPHAGOGASTRODUODENOSCOPY (EGD) WITH PROPOFOL N/A 02/28/2016   Procedure: ESOPHAGOGASTRODUODENOSCOPY (EGD) WITH PROPOFOL withBOTOX;  Surgeon: Manus Gunning, MD;  Location: [facility] ENDOSCOPY;  Service: Gastroenterology;  Laterality: N/A;  . ESOPHAGOGASTRODUODENOSCOPY (EGD) WITH PROPOFOL N/A 04/13/2016   Procedure: ESOPHAGOGASTRODUODENOSCOPY (EGD) WITH PROPOFOL;  Surgeon: Gatha Mayer, MD;  Location: [place];  Service: Endoscopy;  Laterality: N/A;  . PROSTATE SURGERY     radiation therapy only    Family History  Problem Relation Age of Onset  . Diabetes Father   . Diabetes Sister   . Hypertension Sister   . Hypertension Brother   . Heart disease Neg Hx     Social History:  reports that [name] quit smoking about 18 years ago. [name] quit after 10.00 years of use. [name] has never used smokeless tobacco. [name] reports that [name] does not drink alcohol or use drugs.    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 [name] has been told that [name] has a cataract since [name] was young                The blood pressure has been Followed by his  nephrologist Only on atenolol    Followed by nephrologist Dr. Florene Glen for his chronic kidney disease Edema controlled with Lasix daily  Not clear why [name] was placed on hydrocortisone in November at the hospital, cortisol levels were borderline and [name] had issues with dehydration.  [name] was advised to taper this off on his last visit but [name] says his wife does not have the instructions and she did not change his dosage    LABS:  Lab on 08/24/2016  Component Date Value Ref Range Status  . Hgb A1c MFr Bld 08/24/2016 6.8* 4.6 - 6.5 % Final   Glycemic Control Guidelines for People with Diabetes:Non Diabetic:  <6%Goal of Therapy: <7%Additional Action Suggested:  >8%   . Sodium 08/24/2016 139  135 - 145 mEq/L Final  . Potassium 08/24/2016 4.9  3.5 - 5.1 mEq/L Final  . Chloride 08/24/2016 104  96 - 112 mEq/L Final  . CO2 08/24/2016 28  19 - 32 mEq/L Final  . Glucose, Bld 08/24/2016 136* 70 - 99 mg/dL Final  . BUN 08/24/2016 33* 6 - 23 mg/dL Final  . Creatinine, Ser 08/24/2016 2.27* 0.40 - 1.50 mg/dL Final  . Total Bilirubin 08/24/2016 0.3  0.2 - 1.2 mg/dL Final  . Alkaline Phosphatase 08/24/2016 111  39 - 117 U/L Final  . AST 08/24/2016 15  0 - 37 U/L Final  . ALT 08/24/2016 7  0 - 53 U/L Final  . Total Protein 08/24/2016 6.6  6.0 - 8.3 g/dL Final  . Albumin 08/24/2016 3.6  3.5 - 5.2 g/dL Final  . Calcium 08/24/2016 9.3  8.4 - 10.5 mg/dL Final  . GFR 08/24/2016 36.33* >60.00 mL/min Final  . Cholesterol 08/24/2016 137  0 - 200 mg/dL Final   ATP III Classification       Desirable:  < 200  mg/dL               Borderline High:  200 - 239 mg/dL          High:  > = 240 mg/dL  . Triglycerides 08/24/2016 125.0  0.0 - 149.0 mg/dL Final   Normal:  <150 mg/dLBorderline High:  150 - 199 mg/dL  . HDL 08/24/2016 48.90  >39.00 mg/dL Final  . VLDL 08/24/2016 25.0  0.0 - 40.0 mg/dL Final  . LDL Cholesterol 08/24/2016 63  0 - 99 mg/dL Final  . Total CHOL/HDL Ratio 08/24/2016 3   Final                  Men           Women1/2 Average Risk     3.4          3.3Average Risk          5.0          4.42X Average Risk          9.6          7.13X Average Risk          15.0          11.0                      . NonHDL 08/24/2016 88.27   Final   NOTE:  Non-HDL goal should be 30 mg/dL higher than patient's LDL goal (i.e. LDL goal of < 70 mg/dL, would have non-HDL goal of < 100 mg/dL)    Physical Examination:  BP 130/74 (BP Location: Left Arm)   Pulse 77   Ht 5' 7"  (1.702 m)   Wt 135 lb (61.2 kg)   SpO2 96%   BMI 21.14 kg/m       ASSESSMENT/PLAN:  1. Diabetes type 2, nonobese See history of present illness for discussion of current diabetes management, blood sugar patterns and problems identified  His blood sugars now are in the upper 100+ range at home although lab glucose was 136 A1c is higher than usual [name] had done better with taking Actos previously and this had been stopped because of edema Although [name] has lost a lot of weight [name] is not getting bolus meals with mostly a liquid diet because of achalasia and likely not getting enough protein and more carbohydrates and simple sugars [name] is also eating irregularly and during the night also  Recommendations:  [name] will start 15 mg of Actos, previously on 30 mg and continue with glipizide and Tradjenta.  Discussed actions and benefits of Actos and possible side effects of edema and weight gain  Check blood sugars after meals consistently every day.  Consultation with dietitian  Use Glucerna and not Ensure  Cut back on juices   2. Renal insufficiency with relatively higher creatinine, forwarded to nephrologist [name] also needs to discuss his dose of Lasix with nephrologist  3.  Hypertension: Controlled with atenolol  4.  Hypercholesterolemia: [name] is taking 10 mg Lipitor with good control   5. ?  Adrenal insufficiency: Do not think [name] needs to be on hydrocortisone as the diagnosis was not based on a stimulation test and only borderline cortisol  levels [name] will be given a tapering dose and explained to him that we will need to follow him the tapering dose [name] will let us know if [name] has any nausea or lightheadedness  Counseling time on subjects discussed above is over  50% of today's 25 minute visit   Patient Instructions  Start reducing HYDROCORTISONE as follows:  Until 09/05/16 take 1 tablet in the morning and half in the evening Starting 09/06/16 take only 1 tablet in the morning Starting 6/11//18 reduce the morning tablet to half Stop taking hydrocortisone on 09/26/16  Cut back on the amounts of juices and sweet tea May have Glucerna, yogurt and other drinks with protein  Restart pioglitazone with 53m daily      Trayonna Bachmeier 08/29/2016, 9:04 AM   Note: This office note was prepared with DEstate agent Any transcriptional errors that result from this process are unintentional.

## 2016-08-29 ENCOUNTER — Ambulatory Visit (INDEPENDENT_AMBULATORY_CARE_PROVIDER_SITE_OTHER): Payer: Medicare Other | Admitting: Endocrinology

## 2016-08-29 ENCOUNTER — Encounter: Payer: Self-pay | Admitting: Endocrinology

## 2016-08-29 VITALS — BP 130/74 | HR 77 | Ht 67.0 in | Wt 135.0 lb

## 2016-08-29 DIAGNOSIS — E785 Hyperlipidemia, unspecified: Secondary | ICD-10-CM | POA: Diagnosis not present

## 2016-08-29 DIAGNOSIS — N184 Chronic kidney disease, stage 4 (severe): Secondary | ICD-10-CM

## 2016-08-29 DIAGNOSIS — E1165 Type 2 diabetes mellitus with hyperglycemia: Secondary | ICD-10-CM | POA: Diagnosis not present

## 2016-08-29 DIAGNOSIS — R634 Abnormal weight loss: Secondary | ICD-10-CM

## 2016-08-29 DIAGNOSIS — E1169 Type 2 diabetes mellitus with other specified complication: Secondary | ICD-10-CM | POA: Diagnosis not present

## 2016-08-29 MED ORDER — PIOGLITAZONE HCL 15 MG PO TABS: 15.0000 mg | ORAL_TABLET | Freq: Every day | ORAL | 3 refills | Status: DC

## 2016-08-29 NOTE — Patient Instructions (Addendum)
Start reducing HYDROCORTISONE as follows:  Until 09/05/16 take 1 tablet in the morning and half in the evening Starting 09/06/16 take only 1 tablet in the morning Starting 6/11//18 reduce the morning tablet to half Stop taking hydrocortisone on 09/26/16  Cut back on the amounts of juices and sweet tea May have Glucerna, yogurt and other drinks with protein  Restart pioglitazone with 15mg  daily

## 2016-09-21 DIAGNOSIS — C61 Malignant neoplasm of prostate: Secondary | ICD-10-CM | POA: Diagnosis not present

## 2016-09-25 ENCOUNTER — Other Ambulatory Visit: Payer: Self-pay | Admitting: Endocrinology

## 2016-09-28 DIAGNOSIS — C61 Malignant neoplasm of prostate: Secondary | ICD-10-CM | POA: Diagnosis not present

## 2016-10-01 DIAGNOSIS — C61 Malignant neoplasm of prostate: Secondary | ICD-10-CM | POA: Diagnosis not present

## 2016-10-04 ENCOUNTER — Encounter: Payer: Self-pay | Admitting: Dietician

## 2016-10-04 ENCOUNTER — Encounter: Payer: Medicare Other | Attending: Endocrinology | Admitting: Dietician

## 2016-10-04 DIAGNOSIS — R634 Abnormal weight loss: Secondary | ICD-10-CM | POA: Diagnosis not present

## 2016-10-04 DIAGNOSIS — Z713 Dietary counseling and surveillance: Secondary | ICD-10-CM | POA: Insufficient documentation

## 2016-10-04 DIAGNOSIS — E43 Unspecified severe protein-calorie malnutrition: Secondary | ICD-10-CM

## 2016-10-04 DIAGNOSIS — N184 Chronic kidney disease, stage 4 (severe): Secondary | ICD-10-CM | POA: Diagnosis not present

## 2016-10-04 DIAGNOSIS — E1165 Type 2 diabetes mellitus with hyperglycemia: Secondary | ICD-10-CM | POA: Diagnosis not present

## 2016-10-04 DIAGNOSIS — E119 Type 2 diabetes mellitus without complications: Secondary | ICD-10-CM

## 2016-10-04 DIAGNOSIS — E1122 Type 2 diabetes mellitus with diabetic chronic kidney disease: Secondary | ICD-10-CM | POA: Insufficient documentation

## 2016-10-04 LAB — BASIC METABOLIC PANEL
BUN: 48 mg/dL — AB (ref 6–23)
CALCIUM: 9.8 mg/dL (ref 8.4–10.5)
CHLORIDE: 105 meq/L (ref 96–112)
CO2: 24 mEq/L (ref 19–32)
CREATININE: 3.01 mg/dL — AB (ref 0.40–1.50)
GFR: 26.22 mL/min — AB (ref >60.00)
Glucose, Bld: 133 mg/dL — ABNORMAL HIGH (ref 70–99)
Potassium: 5.1 mEq/L (ref 3.5–5.1)
Sodium: 140 mEq/L (ref 135–145)

## 2016-10-04 NOTE — Progress Notes (Signed)
Medical Nutrition Therapy:  Appt start time: 0800 end time:  1015.   Assessment:  Primary concerns today: Patient is here today with his sister.  He was last seen by me 04/2014.  Hx includes Type 2 Diabetes since 2001, Weight loss and CKD stage 4.  He has achalasia and vomits if he eats too fast or eats too thick or solid foods.  He had a procedure done to help his swallowing but stated that it did not work.  He is also just got an implant for treatment of prostate cancer.  Other hx includes HTN and hyperlipidemia. Labs noted:  Hgb 6.8% 08/24/16, potassium 5.1, BUN 48, Creatinine 3.01 with GFR of 26 (10/04/16).    Nutrition Focused Physical Exam performed.    Moderate depression at temporal region  Prominent clavicle bone  Decreased muscle at scapula region  Very little fat at triceps/bicepts  Poorly developed calf muscle.  Ribs are apparent  Weight hx:  Patient reported usual weight of 156 lbs earlier this year.   He has lost 4% of his weight in the past month and 17% in the past several months per patient.    Diet hx:  Due to patient's problems with his esophagus he has been on a liquid diet.  He has only been meeting 50-75% of his caloric needs for greater than 1 month.  Wt Readings from Last 3 Encounters:  10/04/16 130 lb (59 kg)  08/29/16 135 lb (61.2 kg)  07/24/16 135 lb (61.2 kg)   Patient meets criteria for severe malnutrition related to chronic illness as evidenced by weight loss of 17% in the past several months, severe depletion of muscle mass, and <75% of estimated needs by mouth for >1 month.  Patient lives with his wife.  She does the cooking and is only making what is on the list and needs other ideas.  Patient is hungry and feeling week.  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  Preferred Learning Style:   No preference indicated   Learning Readiness:   Ready   MEDICATIONS: Actos, glipizide ER, Trudjenta, KLOR-CON   DIETARY INTAKE: Usual eating pattern  includes 3 meals and 2 snacks per day.  Everyday foods include 1-2 Ensure and other liquid foods.  Avoided foods include regular tea, dislikes Glucerna.    24-hr recall:  B (8-10 AM): 1 pack instant grits and 2 eggs  Snk ( AM): none  L (1:30-2 PM): 1 can pureed [employer] soup, sometimes crackers Snk ( PM): regular soda (1 can) D (6-7PM): 1 can pureed [employer] soup, sometimes crackers Snk ( PM): Ensure Beverages: 1-2 cans Ensure daily, apple juice, OJ, regular soda, coffee with splenda, creamer, 20 ounces water, V-8 vegetable juice, whole milk 2 cups daily  Usual physical activity: walking daily  Estimated energy needs: 2000 calories 70 g protein  Progress Towards Goal(s):  In progress.   Nutritional Diagnosis:  NB-1.1 Food and nutrition-related knowledge deficit As related to weight maintenance and gain as well as diabetes control.  As evidenced by malnutrition, weight loss, diet hx..    Intervention:  Nutrition counseled/eductaion related to a diabetic pureed/liquid diet for weight gain with priority to maximize nutrition to prevent further weight loss and reduce his sodium in his diet.  .Breakfast idea:  1 cup grits (prefer no instant)  2 eggs  1 ounce cheese  1 tsp butter  1/2 cup pureed fruit or 1/2 cup juice  Coffee with splenda and creamer  Lunch and  dinner ideas:  Puree homemade soup or other foods using a low sodium broth  It is healthier to puree foods that are made for usual meals than canned soup.  Each meal should contain approximatly 3 ounces meat (deck of cards) a starch such as potatoes, beans (such as pintos) or sweet potatoes, 1-2 vegetables, and pudding or fruit on the side.     Sample meal:   Pureed chicken   Pureed pinto beans   Pureed sweet potatoes   Pureed green beans   1/2 cup pudding   OR   2 cups pureed homemade soup (made with meat)   6 crackers (soaked well)   1/2 cup pureed fruit  Snack ideas: (Include 2 snacks daily  1.  1-2 Ensure,  Glucerna, Boost Glucose Control  Or Carnation Breakfast Essentials Light Start daily.  Consider choosing the supplements with the lower carbohydrate content.  You can use these as the liquid in a smoothie if you wish (add frozen berries 1/2 banana OR 1/2 banana and peanut butter)  2.  Graham crackers and peanut butter and milk (let crackers soak well).  3. 1/2 cup pudding, 1/2 cup pureed fruit  4.  Greek yogurt, 1/2 cup pureed fruit   Generally avoid:  Added salt  Regular V-8 (choose low sodium)  Canned soup  Canned vegetables (unless no added salt or rinsed).  Regular soda and sweetened tea.  Excessive juice.  Teaching Method Utilized:  Visual Auditory Hands on  Handouts given during visit include:  Pureed diet ideas from AND  Barriers to learning/adherence to lifestyle change: health  Demonstrated degree of understanding via:  Teach Back   Monitoring/Evaluation:  Dietary intake, exercise, and body weight in 6 week(s).

## 2016-10-04 NOTE — Patient Instructions (Signed)
Breakfast idea:  1 cup grits (prefer no instant)  2 eggs  1 ounce cheese  1 tsp butter  1/2 cup pureed fruit or 1/2 cup juice  Coffee with splenda and creamer  Lunch and dinner ideas:  Puree homemade soup or other foods using a low sodium broth  It is healthier to puree foods that are made for usual meals than canned soup.  Each meal should contain approximatly 3 ounces meat (deck of cards) a starch such as potatoes, beans (such as pintos) or sweet potatoes, 1-2 vegetables, and pudding or fruit on the side.     Sample meal:   Pureed chicken   Pureed pinto beans   Pureed sweet potatoes   Pureed green beans   1/2 cup pudding   OR   2 cups pureed homemade soup (made with meat)   6 crackers (soaked well)   1/2 cup pureed fruit  Snack ideas: (Include 2 snacks daily  1.  1-2 Ensure, Glucerna, Boost Glucose Control  Or Carnation Breakfast Essentials Light Start daily.  Consider choosing the supplements with the lower carbohydrate content.  You can use these as the liquid in a smoothie if you wish (add frozen berries 1/2 banana OR 1/2 banana and peanut butter)  2.  Graham crackers and peanut butter and milk (let crackers soak well).  3. 1/2 cup pudding, 1/2 cup pureed fruit  4.  Greek yogurt, 1/2 cup pureed fruit   Generally avoid:  Added salt  Regular V-8 (choose low sodium)  Canned soup  Canned vegetables (unless no added salt or rinsed).  Regular soda and sweetened tea.  Excessive juice.

## 2016-10-05 LAB — FRUCTOSAMINE: Fructosamine: 401 umol/L — ABNORMAL HIGH (ref 0–285)

## 2016-10-09 ENCOUNTER — Other Ambulatory Visit (INDEPENDENT_AMBULATORY_CARE_PROVIDER_SITE_OTHER): Payer: Medicare Other

## 2016-10-09 DIAGNOSIS — E1165 Type 2 diabetes mellitus with hyperglycemia: Secondary | ICD-10-CM

## 2016-10-10 NOTE — Progress Notes (Signed)
Patient ID: [name], male   DOB: 1941-03-20, 76 y.o.   MRN: [id number]   Reason for Appointment : Followup  History of Present Illness          Diagnosis: Type 2 diabetes mellitus, date of diagnosis:   2001     Past history: He has had long-standing diabetes treated with various oral hypoglycemic drugs.  He has not been on metformin because of renal dysfunction He thinks he was given Actos for sometime but not clear if he was benefiting. This was stopped probably in 2012 but he does not think he had any side effects. He thinks he has been taking Tradjenta for a couple of years  His A1c has been monitored about once or twice a year as per records  Because of worsening A1c in 12/14 he was referred for further evaluation. Because of baseline A1c of 8.6% in 12/14 he was started on glipizide ER 5 mg and pioglitazone 30 mg daily in addition to his Tradjenta He was also started on home glucose monitoring with a One Touch monitor  Recent history:   He is taking a 2 drug regimen of glipizide ER 2.5 mg, Tradjenta   His A1c  Recently was 6.8, previously 5.9 and tends to be lower than expected for his blood sugars   his fructosamine is Significantly high at 401, previously 335    Current management, blood sugar patterns and problems identified:  He has again checked his blood sugars only in the mornings and not after meals as discussed  Although his blood sugars at home are looking mostly high in the morning his lab glucose was 133 but he did not have a reading the same morning at home  He was started on Actos on his last visit he does not appear to have any improvement in blood sugars  He has seen the dietitian and is increasing his overall food intake with some weight gain  Previously has had a tendency to high postprandial readings  His exercise level has improved also  Glucose monitoring:   using a  One Touch ultra mini monitor which was reviewed today  Recent  fasting blood sugar range recently: 164-248 AVERAGE 184   Hypoglycemia: None    Self-care: The diet that the patient has been following is: None  Meals: 3 meals per day. Still has some sweet tea and juices such as grape or apple juice  Exercise: walks  Dietician visit:  1/16.  CDE visit  6/28           Weight history:  Wt Readings from Last 3 Encounters:  10/11/16 135 lb 3.2 oz (61.3 kg)  10/04/16 130 lb (59 kg)  08/29/16 135 lb (61.2 kg)   Glycemic control:   Lab Results  Component Value Date   HGBA1C 6.8 (H) 08/24/2016   HGBA1C 5.9 04/10/2016   HGBA1C 6.1 (H) 03/14/2016   Lab Results  Component Value Date   MICROALBUR 2.5 (H) 06/14/2016   LDLCALC 63 08/24/2016   CREATININE 3.01 (H) 10/04/2016    Lab Results  Component Value Date   FRUCTOSAMINE 401 (H) 10/04/2016   FRUCTOSAMINE 335 (H) 06/11/2016   FRUCTOSAMINE 299 (H) 12/23/2014   FRUCTOSAMINE 364 (H) 04/21/2014      Allergies as of 10/11/2016   No Known Allergies     Medication List       Accurate as of 10/11/16  8:32 AM. Always use your most recent med list.  ACCU-CHEK FASTCLIX LANCET Kit Use to check blood sugar once daily E11.65   acetaminophen 650 MG CR tablet Commonly known as:  TYLENOL Take 650 mg by mouth every 4 (four) hours as needed for pain.   atenolol 25 MG tablet Commonly known as:  TENORMIN Take 1 tablet (25 mg total) by mouth daily.   atorvastatin 10 MG tablet Commonly known as:  LIPITOR Take 1 tablet (10 mg total) by mouth at bedtime.   docusate sodium 100 MG capsule Commonly known as:  COLACE TAKE ONE CAPSULE BY MOUTH TWICE A DAY FOR CONSTIPATION   feeding supplement (GLUCERNA SHAKE) Liqd Take 237 mLs by mouth 2 (two) times daily between meals.   feeding supplement (PRO-STAT SUGAR FREE 64) Liqd Take 30 mLs by mouth 2 (two) times daily.   ferrous sulfate 325 (65 FE) MG tablet TAKE 1 TABLET BY MOUTH EVERY DAY FOR SUPPLEMENT   furosemide 40 MG tablet Commonly  known as:  LASIX Take 40 mg by mouth daily.   glipiZIDE 2.5 MG 24 hr tablet Commonly known as:  GLUCOTROL XL Take 1 tablet (2.5 mg total) by mouth daily with breakfast.   glucose blood test strip Commonly known as:  ONE TOUCH ULTRA TEST USE TO TEST BLOOD SUGAR ONCE DAILY Dx code- E11.65   KLOR-CON M20 20 MEQ tablet Generic drug:  potassium chloride SA Take 20 mEq by mouth daily.   multivitamin with minerals Tabs tablet Take 1 tablet by mouth daily.   ONETOUCH DELICA LANCETS 03O Misc USE TO TEST ONCE DAILY Dx code E11.65   pantoprazole 40 MG tablet Commonly known as:  PROTONIX Take 1 tablet (40 mg total) by mouth 2 (two) times daily.   pioglitazone 15 MG tablet Commonly known as:  ACTOS Take 1 tablet (15 mg total) by mouth daily.   polyethylene glycol packet Commonly known as:  MIRALAX / GLYCOLAX Take 17 g by mouth daily.   TRADJENTA 5 MG Tabs tablet Generic drug:  linagliptin TAKE 1 TABLET (5 MG TOTAL) BY MOUTH DAILY.       Allergies: No Known Allergies  Past Medical History:  Diagnosis Date  . Cataract   . CKD (chronic kidney disease) 02/2016  . Diabetes mellitus, type 2 ([place]) 2008  . Hyperlipidemia 2008  . Hypertension 2008  . Prostate cancer ([place]) 2006    Past Surgical History:  Procedure Laterality Date  . BALLOON DILATION N/A 04/13/2016   Procedure: BALLOON DILATION;  Surgeon: Gatha Mayer, MD;  Location: [facility] ENDOSCOPY;  Service: Endoscopy;  Laterality: N/A;  . BOTOX INJECTION N/A 04/13/2016   Procedure: BOTOX INJECTION;  Surgeon: Gatha Mayer, MD;  Location: [place];  Service: Endoscopy;  Laterality: N/A;  . COLONOSCOPY W/ POLYPECTOMY  03/2008   diminutive rectal polyp (path: prolapse type polyp, not adenoma).  moderate pan-diverticulitis.   Marland Kitchen ESOPHAGEAL MANOMETRY  1984   unable to pass manometry catheter beyond LES. Aperiystalsis of esophagus: sugg of achalasia.   Marland Kitchen ESOPHAGOGASTRODUODENOSCOPY N/A 02/17/2016   Procedure:  ESOPHAGOGASTRODUODENOSCOPY (EGD);  Surgeon: [name], MD;  Location: [facility];  Service: Endoscopy;  Laterality: N/A;  . ESOPHAGOGASTRODUODENOSCOPY (EGD) WITH PROPOFOL N/A 02/21/2016   Procedure: ESOPHAGOGASTRODUODENOSCOPY (EGD) WITH PROPOFOL;  Surgeon: Jerene Bears, MD;  Location: [facility] ENDOSCOPY;  Service: Gastroenterology;  Laterality: N/A;  . ESOPHAGOGASTRODUODENOSCOPY (EGD) WITH PROPOFOL N/A 02/28/2016   Procedure: ESOPHAGOGASTRODUODENOSCOPY (EGD) WITH PROPOFOL withBOTOX;  Surgeon: Manus Gunning, MD;  Location: [facility] ENDOSCOPY;  Service: Gastroenterology;  Laterality: N/A;  . ESOPHAGOGASTRODUODENOSCOPY (  EGD) WITH PROPOFOL N/A 04/13/2016   Procedure: ESOPHAGOGASTRODUODENOSCOPY (EGD) WITH PROPOFOL;  Surgeon: Gatha Mayer, MD;  Location: [place];  Service: Endoscopy;  Laterality: N/A;  . PROSTATE SURGERY     radiation therapy only    Family History  Problem Relation Age of Onset  . Diabetes Father   . Diabetes Sister   . Hypertension Sister   . Hypertension Brother   . Heart disease Neg Hx     Social History:  reports that he quit smoking about 18 years ago. He quit after 10.00 years of use. He has never used smokeless tobacco. He reports that he does not drink alcohol or use drugs.    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 He has been told that he has a cataract since he was young                The blood pressure has been Followed by his nephrologist Only on atenolol    Followed by nephrologist Dr. Florene Glen for his chronic kidney disease Edema controlled with Lasix daily  Not clear why he was placed on hydrocortisone in November at the hospital, cortisol levels  were borderline and he had issues with dehydration.  He was advised to taper this off on his last visit but he says his wife does not have the instructions and she did not change his dosage    LABS:  Lab on 10/09/2016  Component Date Value Ref Range Status  . Sodium 10/04/2016 140  135 - 145 mEq/L Final  . Potassium 10/04/2016 5.1  3.5 - 5.1 mEq/L Final  . Chloride 10/04/2016 105  96 - 112 mEq/L Final  . CO2 10/04/2016 24  19 - 32 mEq/L Final  . Glucose, Bld 10/04/2016 133* 70 - 99 mg/dL Final  . BUN 10/04/2016 48* 6 - 23 mg/dL Final  . Creatinine, Ser 10/04/2016 3.01* 0.40 - 1.50 mg/dL Final  . Calcium 10/04/2016 9.8  8.4 - 10.5 mg/dL Final  . GFR 10/04/2016 26.22* >60.00 mL/min Final  . Fructosamine 10/04/2016 401* 0 - 285 umol/L Final   Comment: Published reference interval for apparently healthy subjects between age 3 and 5 is 52 - 285 umol/L and in a poorly controlled diabetic population is 228 - 563 umol/L with a mean of 396 umol/L.     Physical Examination:  BP 138/78   Pulse 71   Ht 5' 7"  (1.702 m)   Wt 135 lb 3.2 oz (61.3 kg)   SpO2 99%   BMI 21.18 kg/m       ASSESSMENT/PLAN:  1. Diabetes type 2, nonobese See history of present illness for discussion of current diabetes management, blood sugar patterns and problems identified  His blood sugars now are Relatively high at home although checked only fasting Not clear if his meter is accurate as his lab glucose was again below 140 compared to his home average of 184 Fructosamine however is higher than usual indicating inadequate control This is despite adding Actos also He has increased his intake to improve his weight and this may be  also causing increased hyperglycemia He is very reluctant to consider insulin  Recommendations:  He will Increase glipizide to 5 mg and continue with Actos and Tradjenta.    Discussed actions of glipizide and possibility of hypoglycemia especially if he is not eating much  carbohydrates or skipping meals  Check blood sugars after meals alternating with fasting readings, his sister will help him to remember to do this  Start using the One Touch ultra monitor that was provided today  Avoid high sugar nutritional supplements, he will check to see what he is getting  Protein at each meal   2. Renal insufficiency with relatively higher creatinine to be followed by nephrologist  3.  Hypertension: Controlled with atenolol  4.  Do not think he has adrenal insufficiency since his blood pressure, electrolytes and clinical picture indicates he is doing well without hydrocortisone that was tapered off    There are no Patient Instructions on file for this visit.     [name] 10/11/2016, 8:32 AM   Note: This office note was prepared with Dragon voice recognition system technology. Any transcriptional errors that result from this process are unintentional.

## 2016-10-11 ENCOUNTER — Other Ambulatory Visit: Payer: Self-pay

## 2016-10-11 ENCOUNTER — Ambulatory Visit (INDEPENDENT_AMBULATORY_CARE_PROVIDER_SITE_OTHER): Payer: Medicare Other | Admitting: Endocrinology

## 2016-10-11 ENCOUNTER — Encounter: Payer: Self-pay | Admitting: Endocrinology

## 2016-10-11 VITALS — BP 138/78 | HR 71 | Ht 67.0 in | Wt 135.2 lb

## 2016-10-11 DIAGNOSIS — E1165 Type 2 diabetes mellitus with hyperglycemia: Secondary | ICD-10-CM

## 2016-10-11 MED ORDER — GLIPIZIDE ER 5 MG PO TB24: 5.0000 mg | ORAL_TABLET | Freq: Every day | ORAL | 1 refills | Status: DC

## 2016-10-11 MED ORDER — GLUCOSE BLOOD VI STRP: ORAL_STRIP | 3 refills | Status: DC

## 2016-10-11 NOTE — Patient Instructions (Addendum)
Check blood sugars on waking up  3-4/7  Also check blood sugars about 2 hours after a meal and do this after different meals by rotation  Recommended blood sugar levels on waking up is 90-130 and about 2 hours after meal is 130-160  Please bring your blood sugar monitor to each visit, thank you  Take 2 of GLIPIZIDE in am

## 2016-10-18 ENCOUNTER — Other Ambulatory Visit: Payer: Self-pay | Admitting: Family

## 2016-10-18 NOTE — Telephone Encounter (Signed)
Pt see Dr. Dwyane Dee for his diabetes forwarding request to endo for approval.../lmb

## 2016-11-01 DIAGNOSIS — I10 Essential (primary) hypertension: Secondary | ICD-10-CM | POA: Diagnosis not present

## 2016-11-01 DIAGNOSIS — C61 Malignant neoplasm of prostate: Secondary | ICD-10-CM | POA: Diagnosis not present

## 2016-11-01 DIAGNOSIS — R634 Abnormal weight loss: Secondary | ICD-10-CM | POA: Diagnosis not present

## 2016-11-01 DIAGNOSIS — N183 Chronic kidney disease, stage 3 (moderate): Secondary | ICD-10-CM | POA: Diagnosis not present

## 2016-11-01 DIAGNOSIS — E119 Type 2 diabetes mellitus without complications: Secondary | ICD-10-CM | POA: Diagnosis not present

## 2016-11-01 DIAGNOSIS — K220 Achalasia of cardia: Secondary | ICD-10-CM | POA: Diagnosis not present

## 2016-11-07 ENCOUNTER — Encounter: Payer: Self-pay | Admitting: Gastroenterology

## 2016-11-08 ENCOUNTER — Other Ambulatory Visit (INDEPENDENT_AMBULATORY_CARE_PROVIDER_SITE_OTHER): Payer: Medicare Other

## 2016-11-08 ENCOUNTER — Ambulatory Visit: Payer: Medicare Other | Admitting: Dietician

## 2016-11-08 ENCOUNTER — Other Ambulatory Visit: Payer: Self-pay

## 2016-11-08 DIAGNOSIS — E1165 Type 2 diabetes mellitus with hyperglycemia: Secondary | ICD-10-CM | POA: Diagnosis not present

## 2016-11-08 LAB — COMPREHENSIVE METABOLIC PANEL
ALK PHOS: 88 U/L (ref 39–117)
ALT: 6 U/L (ref 0–53)
AST: 16 U/L (ref 0–37)
Albumin: 3.6 g/dL (ref 3.5–5.2)
BUN: 57 mg/dL — AB (ref 6–23)
CHLORIDE: 107 meq/L (ref 96–112)
CO2: 23 mEq/L (ref 19–32)
Calcium: 9.5 mg/dL (ref 8.4–10.5)
Creatinine, Ser: 2.5 mg/dL — ABNORMAL HIGH (ref 0.40–1.50)
GFR: 32.48 mL/min — AB (ref >60.00)
Glucose, Bld: 110 mg/dL — ABNORMAL HIGH (ref 70–99)
POTASSIUM: 4.3 meq/L (ref 3.5–5.1)
SODIUM: 141 meq/L (ref 135–145)
TOTAL PROTEIN: 6.8 g/dL (ref 6.0–8.3)
Total Bilirubin: 0.3 mg/dL (ref 0.2–1.2)

## 2016-11-08 LAB — HEMOGLOBIN A1C: HEMOGLOBIN A1C: 7.2 % — AB (ref 4.6–6.5)

## 2016-11-08 MED ORDER — GLUCOSE BLOOD VI STRP: ORAL_STRIP | 12 refills | Status: AC

## 2016-11-08 MED ORDER — ACCU-CHEK FASTCLIX LANCETS MISC
0 refills | Status: AC
Start: 2016-11-08 — End: ?

## 2016-11-09 LAB — FRUCTOSAMINE: FRUCTOSAMINE: 333 umol/L — AB (ref 0–285)

## 2016-11-10 ENCOUNTER — Other Ambulatory Visit: Payer: Self-pay | Admitting: Endocrinology

## 2016-11-12 ENCOUNTER — Ambulatory Visit (INDEPENDENT_AMBULATORY_CARE_PROVIDER_SITE_OTHER): Payer: Medicare Other | Admitting: Endocrinology

## 2016-11-12 ENCOUNTER — Encounter: Payer: Self-pay | Admitting: Endocrinology

## 2016-11-12 VITALS — BP 136/82 | HR 65 | Ht 67.0 in | Wt 141.6 lb

## 2016-11-12 DIAGNOSIS — E1165 Type 2 diabetes mellitus with hyperglycemia: Secondary | ICD-10-CM

## 2016-11-12 NOTE — Progress Notes (Signed)
Patient ID: [name], male   DOB: 02-07-1941, 76 y.o.   MRN: [id number]   Reason for Appointment : Followup  History of Present Illness          Diagnosis: Type 2 diabetes mellitus, date of diagnosis:   2001     Past history: He has had long-standing diabetes treated with various oral hypoglycemic drugs.  He has not been on metformin because of renal dysfunction He thinks he was given Actos for sometime but not clear if he was benefiting. This was stopped probably in 2012 but he does not think he had any side effects. He thinks he has been taking Tradjenta for a couple of years  His A1c has been monitored about once or twice a year as per records  Because of worsening A1c in 12/14 he was referred for further evaluation. Because of baseline A1c of 8.6% in 12/14 he was started on glipizide ER 5 mg and pioglitazone 30 mg daily in addition to his Tradjenta He was also started on home glucose monitoring with a One Touch monitor  Recent history:   He is taking a 3 drug regimen of glipizide ER 5 mg, Tradjenta and Actos 15 mg   His A1c Recently was 7.2, Previous range as low as 5.9 and tends to be lower than expected for his blood sugars  Fructosamine is 333 compared to 401 previously    Current management, blood sugar patterns and problems identified:  He has been taking the higher dose of glipizide since his last visit  Now he is checking his blood sugars fairly regularly twice a day in the morning and around 8-9 PM  He has excellent blood sugars now in the morning compared to before although he has some fluctuation in his blood sugars at all times  He has done well with diet and exercise regimen and is eating better and is able to gain back some weight  No hypoglycemia with only occasional readings in the 80s  However he is not aware of potential for hypoglycemia  His exercise level has been consistent also  Glucose monitoring:   using a  One Touch ultra 2  monitor which was reviewed today  Mean values apply above for all meters except median for One Touch  PRE-MEAL Fasting Lunch Dinner Bedtime Overall  Glucose range: 145-167    82-265    Mean/median: 129  130   126  130     Hypoglycemia: None    Self-care: The diet that the patient has been following is: None  Meals: 3 meals per day.Less sweet tea and juices such as grape or apple juice  Exercise: walks 30+ min almost daily  Dietician visit:  10/04/16.            Weight history:  Wt Readings from Last 3 Encounters:  11/12/16 141 lb 9.6 oz (64.2 kg)  10/11/16 135 lb 3.2 oz (61.3 kg)  10/04/16 130 lb (59 kg)   Glycemic control:   Lab Results  Component Value Date   HGBA1C 7.2 (H) 11/08/2016   HGBA1C 6.8 (H) 08/24/2016   HGBA1C 5.9 04/10/2016   Lab Results  Component Value Date   MICROALBUR 2.5 (H) 06/14/2016   LDLCALC 63 08/24/2016   CREATININE 2.50 (H) 11/08/2016    Lab Results  Component Value Date   FRUCTOSAMINE 333 (H) 11/08/2016   FRUCTOSAMINE 401 (H) 10/04/2016   FRUCTOSAMINE 335 (H) 06/11/2016   FRUCTOSAMINE 299 (H) 12/23/2014  Allergies as of 11/12/2016   No Known Allergies     Medication List       Accurate as of 11/12/16 10:06 AM. Always use your most recent med list.          ACCU-CHEK FASTCLIX LANCETS Misc Use to check blood sugars twice daily E11.65   acetaminophen 650 MG CR tablet Commonly known as:  TYLENOL Take 650 mg by mouth every 4 (four) hours as needed for pain.   atenolol 25 MG tablet Commonly known as:  TENORMIN Take 1 tablet (25 mg total) by mouth daily.   atorvastatin 10 MG tablet Commonly known as:  LIPITOR Take 1 tablet (10 mg total) by mouth at bedtime.   docusate sodium 100 MG capsule Commonly known as:  COLACE TAKE ONE CAPSULE BY MOUTH TWICE A DAY FOR CONSTIPATION   feeding supplement (PRO-STAT SUGAR FREE 64) Liqd Take 30 mLs by mouth 2 (two) times daily.   ferrous sulfate 325 (65 FE) MG tablet TAKE 1 TABLET  BY MOUTH EVERY DAY FOR SUPPLEMENT   FLUoxetine 10 MG tablet Commonly known as:  PROZAC Take 10 mg by mouth daily.   glipiZIDE 5 MG 24 hr tablet Commonly known as:  GLUCOTROL XL TAKE 1 TABLET (5 MG TOTAL) BY MOUTH DAILY WITH BREAKFAST.   glucose blood test strip Use as instructed   multivitamin with minerals Tabs tablet Take 1 tablet by mouth daily.   pantoprazole 40 MG tablet Commonly known as:  PROTONIX Take 1 tablet (40 mg total) by mouth 2 (two) times daily.   pioglitazone 15 MG tablet Commonly known as:  ACTOS Take 1 tablet (15 mg total) by mouth daily.   polyethylene glycol packet Commonly known as:  MIRALAX / GLYCOLAX Take 17 g by mouth daily.   TRADJENTA 5 MG Tabs tablet Generic drug:  linagliptin TAKE 1 TABLET (5 MG TOTAL) BY MOUTH DAILY.       Allergies: No Known Allergies  Past Medical History:  Diagnosis Date  . Cataract   . CKD (chronic kidney disease) 02/2016  . Diabetes mellitus, type 2 ([place]) 2008  . Hyperlipidemia 2008  . Hypertension 2008  . Prostate cancer ([place]) 2006    Past Surgical History:  Procedure Laterality Date  . BALLOON DILATION N/A 04/13/2016   Procedure: BALLOON DILATION;  Surgeon: Gatha Mayer, MD;  Location: [facility] ENDOSCOPY;  Service: Endoscopy;  Laterality: N/A;  . BOTOX INJECTION N/A 04/13/2016   Procedure: BOTOX INJECTION;  Surgeon: Gatha Mayer, MD;  Location: [place];  Service: Endoscopy;  Laterality: N/A;  . COLONOSCOPY W/ POLYPECTOMY  03/2008   diminutive rectal polyp (path: prolapse type polyp, not adenoma).  moderate pan-diverticulitis.   Marland Kitchen ESOPHAGEAL MANOMETRY  1984   unable to pass manometry catheter beyond LES. Aperiystalsis of esophagus: sugg of achalasia.   Marland Kitchen ESOPHAGOGASTRODUODENOSCOPY N/A 02/17/2016   Procedure: ESOPHAGOGASTRODUODENOSCOPY (EGD);  Surgeon: [name], MD;  Location: [facility]OS[facility];  Service: Endoscopy;  Laterality: N/A;  . ESOPHAGOGASTRODUODENOSCOPY (EGD) WITH PROPOFOL N/A 02/21/2016    Procedure: ESOPHAGOGASTRODUODENOSCOPY (EGD) WITH PROPOFOL;  Surgeon: Jerene Bears, MD;  Location: [facility] ENDOSCOPY;  Service: Gastroenterology;  Laterality: N/A;  . ESOPHAGOGASTRODUODENOSCOPY (EGD) WITH PROPOFOL N/A 02/28/2016   Procedure: ESOPHAGOGASTRODUODENOSCOPY (EGD) WITH PROPOFOL withBOTOX;  Surgeon: Manus Gunning, MD;  Location: [facility] ENDOSCOPY;  Service: Gastroenterology;  Laterality: N/A;  . ESOPHAGOGASTRODUODENOSCOPY (EGD) WITH PROPOFOL N/A 04/13/2016   Procedure: ESOPHAGOGASTRODUODENOSCOPY (EGD) WITH PROPOFOL;  Surgeon: Gatha Mayer, MD;  Location: [place];  Service:  Endoscopy;  Laterality: N/A;  . PROSTATE SURGERY     radiation therapy only    Family History  Problem Relation Age of Onset  . Diabetes Father   . Diabetes Sister   . Hypertension Sister   . Hypertension Brother   . Heart disease Neg Hx     Social History:  reports that he quit smoking about 18 years ago. He quit after 10.00 years of use. He has never used smokeless tobacco. He reports that he does not drink alcohol or use drugs.    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 He has been told that he has a cataract since he was young                The blood pressure has been Followed by his nephrologist Only on atenolol    Followed by nephrologist Dr. Florene Glen for his chronic kidney disease Edema controlled with Lasix daily  He had been given hydrocortisone on a previous hospital discharge but this has been tapered off and he has no signs or symptoms of adrenal insufficiency now    LABS:  Lab on 11/08/2016  Component Date Value Ref Range Status  . Hgb A1c MFr Bld 11/08/2016 7.2* 4.6 - 6.5 % Final    Glycemic Control Guidelines for People with Diabetes:Non Diabetic:  <6%Goal of Therapy: <7%Additional Action Suggested:  >8%   . Sodium 11/08/2016 141  135 - 145 mEq/L Final  . Potassium 11/08/2016 4.3  3.5 - 5.1 mEq/L Final  . Chloride 11/08/2016 107  96 - 112 mEq/L Final  . CO2 11/08/2016 23  19 - 32 mEq/L Final  . Glucose, Bld 11/08/2016 110* 70 - 99 mg/dL Final  . BUN 11/08/2016 57* 6 - 23 mg/dL Final  . Creatinine, Ser 11/08/2016 2.50* 0.40 - 1.50 mg/dL Final  . Total Bilirubin 11/08/2016 0.3  0.2 - 1.2 mg/dL Final  . Alkaline Phosphatase 11/08/2016 88  39 - 117 U/L Final  . AST 11/08/2016 16  0 - 37 U/L Final  . ALT 11/08/2016 6  0 - 53 U/L Final  . Total Protein 11/08/2016 6.8  6.0 - 8.3 g/dL Final  . Albumin 11/08/2016 3.6  3.5 - 5.2 g/dL Final  . Calcium 11/08/2016 9.5  8.4 - 10.5 mg/dL Final  . GFR 11/08/2016 32.48* >60.00 mL/min Final  . Fructosamine 11/08/2016 333* 0 - 285 umol/L Final   Comment: Published reference interval for apparently healthy subjects between age 67 and 84 is 1 - 285 umol/L and in a poorly controlled diabetic population is 228 - 563 umol/L with a mean of 396 umol/L.     Physical Examination:  BP 136/82   Pulse 65   Ht 5\' 7"  (1.702 m)   Wt 141 lb 9.6 oz (64.2 kg)   SpO2 99%   BMI 22.18 kg/m       ASSESSMENT/PLAN:  1. Diabetes type 2, nonobese See history of present illness for discussion of current diabetes management, blood sugar patterns and problems identified  His blood sugars now are Improving significantly with increasing his  glipizide as well as his trying to be consistent with diet He has gained back some of the weight he had lost Has been better with his monitoring also and glucose readings are only high at night when he goes off and drinks juice  Recommendations:  He will continue his medications unchanged for now  Discussed actions of glipizide and  Again discussed possibility of hypoglycemia especially if he is not  eating on time and being more active at times also  Even handout on hypoglycemia for him to have on hand  He will call if he has any low blood sugars are symptoms of hypoglycemia  Continue regular walking   2. Renal insufficiency with relatively better creatinine, to be followed by nephrologist  3.  Hypertension: Controlled with atenolol     Patient Instructions  Check blood sugars on waking up  3/7 days  Also check blood sugars about 2 hours after a meal and do this after different meals by rotation  Recommended blood sugar levels on waking up is 90-130 and about 2 hours after meal is 130-160  Please bring your blood sugar monitor to each visit, thank you  Call if blood sugar starts getting below 80       Jeffery Bachmeier 11/12/2016, 10:06 AM   Note: This office note was prepared with Dragon voice recognition system technology. Any transcriptional errors that result from this process are unintentional.

## 2016-11-12 NOTE — Addendum Note (Signed)
Addended by: Elayne Snare on: 11/12/2016 10:07 AM   Modules accepted: Orders

## 2016-11-12 NOTE — Patient Instructions (Signed)
Check blood sugars on waking up  3/7 days  Also check blood sugars about 2 hours after a meal and do this after different meals by rotation  Recommended blood sugar levels on waking up is 90-130 and about 2 hours after meal is 130-160  Please bring your blood sugar monitor to each visit, thank you  Call if blood sugar starts getting below 80

## 2016-11-13 DIAGNOSIS — C61 Malignant neoplasm of prostate: Secondary | ICD-10-CM | POA: Diagnosis not present

## 2016-11-13 DIAGNOSIS — R9721 Rising PSA following treatment for malignant neoplasm of prostate: Secondary | ICD-10-CM | POA: Diagnosis not present

## 2016-11-19 ENCOUNTER — Other Ambulatory Visit: Payer: Self-pay

## 2016-11-19 MED ORDER — PIOGLITAZONE HCL 15 MG PO TABS: 15.0000 mg | ORAL_TABLET | Freq: Every day | ORAL | 2 refills | Status: DC

## 2016-11-21 DIAGNOSIS — K220 Achalasia of cardia: Secondary | ICD-10-CM | POA: Diagnosis not present

## 2016-11-21 DIAGNOSIS — R634 Abnormal weight loss: Secondary | ICD-10-CM | POA: Diagnosis not present

## 2016-11-21 DIAGNOSIS — C61 Malignant neoplasm of prostate: Secondary | ICD-10-CM | POA: Diagnosis not present

## 2016-11-21 DIAGNOSIS — E8770 Fluid overload, unspecified: Secondary | ICD-10-CM | POA: Diagnosis not present

## 2016-11-21 DIAGNOSIS — I10 Essential (primary) hypertension: Secondary | ICD-10-CM | POA: Diagnosis not present

## 2016-11-21 DIAGNOSIS — N183 Chronic kidney disease, stage 3 (moderate): Secondary | ICD-10-CM | POA: Diagnosis not present

## 2016-11-21 DIAGNOSIS — E119 Type 2 diabetes mellitus without complications: Secondary | ICD-10-CM | POA: Diagnosis not present

## 2016-11-21 DIAGNOSIS — D631 Anemia in chronic kidney disease: Secondary | ICD-10-CM | POA: Diagnosis not present

## 2016-11-24 ENCOUNTER — Other Ambulatory Visit: Payer: Self-pay | Admitting: Endocrinology

## 2016-11-27 ENCOUNTER — Other Ambulatory Visit: Payer: Self-pay | Admitting: Endocrinology

## 2016-12-03 ENCOUNTER — Other Ambulatory Visit (HOSPITAL_COMMUNITY): Payer: Self-pay | Admitting: *Deleted

## 2016-12-04 ENCOUNTER — Ambulatory Visit (HOSPITAL_COMMUNITY)
Admission: RE | Admit: 2016-12-04 | Discharge: 2016-12-04 | Disposition: A | Discharge status: Home / Self Care | Payer: Medicare Other | Source: Ambulatory Visit | Attending: Nephrology | Admitting: Nephrology

## 2016-12-04 DIAGNOSIS — N183 Chronic kidney disease, stage 3 (moderate): Secondary | ICD-10-CM | POA: Insufficient documentation

## 2016-12-04 DIAGNOSIS — I129 Hypertensive chronic kidney disease with stage 1 through stage 4 chronic kidney disease, or unspecified chronic kidney disease: Secondary | ICD-10-CM | POA: Insufficient documentation

## 2016-12-04 DIAGNOSIS — K220 Achalasia of cardia: Secondary | ICD-10-CM | POA: Diagnosis not present

## 2016-12-04 DIAGNOSIS — E1122 Type 2 diabetes mellitus with diabetic chronic kidney disease: Secondary | ICD-10-CM | POA: Insufficient documentation

## 2016-12-04 DIAGNOSIS — E8770 Fluid overload, unspecified: Secondary | ICD-10-CM | POA: Insufficient documentation

## 2016-12-04 DIAGNOSIS — D631 Anemia in chronic kidney disease: Secondary | ICD-10-CM | POA: Insufficient documentation

## 2016-12-04 DIAGNOSIS — R634 Abnormal weight loss: Secondary | ICD-10-CM | POA: Insufficient documentation

## 2016-12-04 DIAGNOSIS — C61 Malignant neoplasm of prostate: Secondary | ICD-10-CM | POA: Diagnosis not present

## 2016-12-04 DIAGNOSIS — Z87891 Personal history of nicotine dependence: Secondary | ICD-10-CM | POA: Diagnosis not present

## 2016-12-04 MED ORDER — SODIUM CHLORIDE 0.9 % IV SOLN
510.0000 mg | INTRAVENOUS | Status: DC
  Administered 2016-12-04: 09:00:00 510 mg via INTRAVENOUS
  Filled 2016-12-04: qty 17

## 2016-12-11 ENCOUNTER — Other Ambulatory Visit (HOSPITAL_COMMUNITY): Payer: Self-pay | Admitting: *Deleted

## 2016-12-12 ENCOUNTER — Ambulatory Visit (HOSPITAL_COMMUNITY)
Admission: RE | Admit: 2016-12-12 | Discharge: 2016-12-12 | Disposition: A | Discharge status: Home / Self Care | Payer: Medicare Other | Source: Ambulatory Visit | Attending: Nephrology | Admitting: Nephrology

## 2016-12-12 DIAGNOSIS — N189 Chronic kidney disease, unspecified: Secondary | ICD-10-CM | POA: Insufficient documentation

## 2016-12-12 DIAGNOSIS — D631 Anemia in chronic kidney disease: Secondary | ICD-10-CM | POA: Insufficient documentation

## 2016-12-12 MED ORDER — SODIUM CHLORIDE 0.9 % IV SOLN
510.0000 mg | INTRAVENOUS | Status: AC
  Administered 2016-12-12: 09:00:00 510 mg via INTRAVENOUS
  Filled 2016-12-12: qty 17

## 2016-12-16 ENCOUNTER — Other Ambulatory Visit: Payer: Self-pay | Admitting: Family Medicine

## 2016-12-28 ENCOUNTER — Other Ambulatory Visit: Payer: Self-pay

## 2016-12-28 MED ORDER — GLIPIZIDE ER 5 MG PO TB24: 5.0000 mg | ORAL_TABLET | Freq: Every day | ORAL | 1 refills | Status: DC

## 2017-01-01 ENCOUNTER — Other Ambulatory Visit: Payer: Self-pay | Admitting: Family Medicine

## 2017-01-01 ENCOUNTER — Telehealth: Payer: Self-pay

## 2017-01-01 ENCOUNTER — Other Ambulatory Visit: Payer: Self-pay

## 2017-01-01 ENCOUNTER — Encounter (HOSPITAL_COMMUNITY): Payer: Self-pay | Admitting: *Deleted

## 2017-01-01 DIAGNOSIS — R1319 Other dysphagia: Secondary | ICD-10-CM

## 2017-01-01 NOTE — Telephone Encounter (Signed)
Contacted. Please see other telephone note. Discussed in detail about the diet and the fasting. Spouse says she understands.

## 2017-01-01 NOTE — Telephone Encounter (Signed)
Spoke with the patient's wife. He is having troubles swallowing pureed foods now. She is talking to the patient during the phone call. He is not in pain. She observes that he will only swallow a few "bites" before he quits and goes to lay down. She is not hearing him cough or choke.  I am unsure who is primary GI actually is. Dr Henrene Pastor has not seen him in years. By Dr Eugenia Pancoast last note, he was offered to consult with you about further treatment of achalasia. The patient is ready to do this. He is on blood thinners. No openings on your schedule. Please advise.

## 2017-01-01 NOTE — Telephone Encounter (Signed)
Spoke with the spouse. Instructed to arrive at [facility] Admission office at 6:30 am per Endo staff. Today clear liquids only. NPO after midnight.  Referral coordinator advised of "urgent case status" for insurance purposes. Orders entered. Doubled checked with Endo staff.

## 2017-01-01 NOTE — Telephone Encounter (Signed)
-----   Message from Alfredia Ferguson, PA-C sent at 01/01/2017  3:48 PM EDT ----- Regarding: EGD   I spoke to pt and wife  At length. Pt has been on a pureed diet-he was fixing eggs and drinking milk when I called.   He is swallowing without difficulty but frequently vomiting once in the evenings and will bring up some food  He can drink liquids most of the time without bringing them back up . Has not spit up or vomited today. He does not feel like he has food stuck in esophagus. He says he doesn't want to get as bad as he was before . I spoke with Endo and he is scheduled for EGD with botox  With Dr Havery Moros tomorrow am at 7:30 at [facility]. He needs to be NPO now  Until post procedure in am .    Beth,PLease put orders n and call pt and wife back with instructions, and convert to phone note . Thanks! ----- Message ----- From: Yetta Flock, MD Sent: 01/01/2017  12:35 PM To: Amy S Esterwood, PA-C, Vena Rua, PA-C  FYI please keep an eye out for this guy. If we can do an EGD with anesthesia today (hopefully not at 5PM)  that would be preferable  ----- Message ----- From: Mauri Pole, MD Sent: 01/01/2017  12:08 PM To: Greggory Keen, LPN, #  Alfonso Patten,  This patient has seen Dr Henrene Pastor, Dr Ardis Hughs and Dr Carlean Purl. He has achalasia, with recent worsening of difficulty swallowing, failing to swallow even puree or liquids. I am worried about food impaction, likely has esophagus impacted with food. Advising him to go to ER. Will likely need EGD and also may consider botox to temporarily to improve his symptoms until we can discuss something more definitive (pneumatic dilation vs surgery) Thank you Margarette Asal

## 2017-01-01 NOTE — Anesthesia Preprocedure Evaluation (Addendum)
Anesthesia Evaluation  Patient identified by MRN, date of birth, ID band Patient awake    Reviewed: Allergy & Precautions, NPO status , Patient's Chart, lab work & pertinent test results, reviewed documented beta blocker date and time   Airway Mallampati: II  TM Distance: >3 FB Neck ROM: Full    Dental  (+) Teeth Intact, Edentulous Upper, Dental Advisory Given, Missing,    Pulmonary former smoker,    Pulmonary exam normal breath sounds clear to auscultation       Cardiovascular hypertension, Pt. on home beta blockers Normal cardiovascular exam Rhythm:Regular Rate:Normal     Neuro/Psych negative neurological ROS  negative psych ROS   GI/Hepatic GERD  Medicated,Distal esophogeal obstruction   Endo/Other  diabetes, Type 2, Oral Hypoglycemic Agents  Renal/GU Renal InsufficiencyRenal disease     Musculoskeletal   Abdominal   Peds  Hematology   Anesthesia Other Findings   Reproductive/Obstetrics                             Anesthesia Physical  Anesthesia Plan  ASA: III  Anesthesia Plan: General   Post-op Pain Management:    Induction: Intravenous, Rapid sequence and Cricoid pressure planned  PONV Risk Score and Plan: Ondansetron and Dexamethasone  Airway Management Planned: Oral ETT  Additional Equipment:   Intra-op Plan:   Post-operative Plan: Extubation in OR  Informed Consent: I have reviewed the patients History and Physical, chart, labs and discussed the procedure including the risks, benefits and alternatives for the proposed anesthesia with the patient or authorized representative who has indicated his/her understanding and acceptance.   Dental advisory given  Plan Discussed with: CRNA  Anesthesia Plan Comments: (Risks/benefits of general anesthesia discussed with patient including risk of damage to teeth, lips, gum, and tongue, nausea/vomiting, allergic reactions to  medications, and the possibility of heart attack, stroke and death.  All patient questions answered.  Patient wishes to proceed.)       Anesthesia Quick Evaluation

## 2017-01-01 NOTE — Telephone Encounter (Signed)
Patient likely has food impaction given his recent worsening of difficulty swallowing. He will need to go to ER and will need EGD to clear the esophagus.  Please schedule office visit to discuss definitive therapy for achalasia once acute issues resolve

## 2017-01-02 ENCOUNTER — Encounter (HOSPITAL_COMMUNITY)
Admission: RE | Disposition: A | Discharge status: Home / Self Care | Payer: Self-pay | Source: Ambulatory Visit | Attending: Gastroenterology

## 2017-01-02 ENCOUNTER — Ambulatory Visit (HOSPITAL_COMMUNITY): Payer: Medicare Other | Admitting: Certified Registered Nurse Anesthetist

## 2017-01-02 ENCOUNTER — Ambulatory Visit (HOSPITAL_COMMUNITY)
Admission: RE | Admit: 2017-01-02 | Discharge: 2017-01-02 | Disposition: A | Discharge status: Home / Self Care | Payer: Medicare Other | Source: Ambulatory Visit | Attending: Gastroenterology | Admitting: Gastroenterology

## 2017-01-02 ENCOUNTER — Encounter (HOSPITAL_COMMUNITY): Payer: Self-pay | Admitting: *Deleted

## 2017-01-02 DIAGNOSIS — K2950 Unspecified chronic gastritis without bleeding: Secondary | ICD-10-CM | POA: Insufficient documentation

## 2017-01-02 DIAGNOSIS — Z8546 Personal history of malignant neoplasm of prostate: Secondary | ICD-10-CM | POA: Diagnosis not present

## 2017-01-02 DIAGNOSIS — K219 Gastro-esophageal reflux disease without esophagitis: Secondary | ICD-10-CM | POA: Diagnosis not present

## 2017-01-02 DIAGNOSIS — E1122 Type 2 diabetes mellitus with diabetic chronic kidney disease: Secondary | ICD-10-CM | POA: Insufficient documentation

## 2017-01-02 DIAGNOSIS — I129 Hypertensive chronic kidney disease with stage 1 through stage 4 chronic kidney disease, or unspecified chronic kidney disease: Secondary | ICD-10-CM | POA: Insufficient documentation

## 2017-01-02 DIAGNOSIS — K317 Polyp of stomach and duodenum: Secondary | ICD-10-CM

## 2017-01-02 DIAGNOSIS — K209 Esophagitis, unspecified: Secondary | ICD-10-CM

## 2017-01-02 DIAGNOSIS — K228 Other specified diseases of esophagus: Secondary | ICD-10-CM | POA: Diagnosis not present

## 2017-01-02 DIAGNOSIS — K222 Esophageal obstruction: Secondary | ICD-10-CM

## 2017-01-02 DIAGNOSIS — K220 Achalasia of cardia: Secondary | ICD-10-CM | POA: Diagnosis not present

## 2017-01-02 DIAGNOSIS — B9681 Helicobacter pylori [H. pylori] as the cause of diseases classified elsewhere: Secondary | ICD-10-CM | POA: Diagnosis not present

## 2017-01-02 DIAGNOSIS — R1310 Dysphagia, unspecified: Secondary | ICD-10-CM | POA: Diagnosis not present

## 2017-01-02 DIAGNOSIS — E785 Hyperlipidemia, unspecified: Secondary | ICD-10-CM | POA: Insufficient documentation

## 2017-01-02 DIAGNOSIS — Z7984 Long term (current) use of oral hypoglycemic drugs: Secondary | ICD-10-CM | POA: Diagnosis not present

## 2017-01-02 DIAGNOSIS — K449 Diaphragmatic hernia without obstruction or gangrene: Secondary | ICD-10-CM | POA: Diagnosis not present

## 2017-01-02 DIAGNOSIS — N189 Chronic kidney disease, unspecified: Secondary | ICD-10-CM | POA: Insufficient documentation

## 2017-01-02 DIAGNOSIS — Z79899 Other long term (current) drug therapy: Secondary | ICD-10-CM | POA: Insufficient documentation

## 2017-01-02 DIAGNOSIS — R1319 Other dysphagia: Secondary | ICD-10-CM

## 2017-01-02 DIAGNOSIS — Z87891 Personal history of nicotine dependence: Secondary | ICD-10-CM | POA: Insufficient documentation

## 2017-01-02 DIAGNOSIS — Z86718 Personal history of other venous thrombosis and embolism: Secondary | ICD-10-CM | POA: Diagnosis not present

## 2017-01-02 DIAGNOSIS — E46 Unspecified protein-calorie malnutrition: Secondary | ICD-10-CM | POA: Diagnosis not present

## 2017-01-02 HISTORY — DX: Pneumonia, unspecified organism: J18.9

## 2017-01-02 HISTORY — PX: BOTOX INJECTION: SHX5754

## 2017-01-02 HISTORY — DX: Gastro-esophageal reflux disease without esophagitis: K21.9

## 2017-01-02 HISTORY — DX: Acute embolism and thrombosis of unspecified vein: I82.90

## 2017-01-02 HISTORY — DX: Anemia, unspecified: D64.9

## 2017-01-02 HISTORY — PX: ESOPHAGOGASTRODUODENOSCOPY (EGD) WITH PROPOFOL: SHX5813

## 2017-01-02 LAB — GLUCOSE, CAPILLARY: GLUCOSE-CAPILLARY: 187 mg/dL — AB (ref 65–99)

## 2017-01-02 SURGERY — EGD (ESOPHAGOGASTRODUODENOSCOPY)
Anesthesia: Monitor Anesthesia Care

## 2017-01-02 SURGERY — ESOPHAGOGASTRODUODENOSCOPY (EGD) WITH PROPOFOL
Anesthesia: Monitor Anesthesia Care

## 2017-01-02 MED ORDER — LIDOCAINE HCL (CARDIAC) 20 MG/ML IV SOLN
INTRAVENOUS | Status: DC | PRN
  Administered 2017-01-02: 50 mg via INTRAVENOUS

## 2017-01-02 MED ORDER — ONABOTULINUMTOXINA 100 UNITS IJ SOLR: 100.0000 [IU] | INTRAMUSCULAR | Status: DC

## 2017-01-02 MED ORDER — PROPOFOL 10 MG/ML IV BOLUS
INTRAVENOUS | Status: AC
  Filled 2017-01-02: qty 60

## 2017-01-02 MED ORDER — SODIUM CHLORIDE 0.9 % IJ SOLN
INTRAMUSCULAR | Status: AC
  Filled 2017-01-02: qty 10

## 2017-01-02 MED ORDER — ONABOTULINUMTOXINA 100 UNITS IJ SOLR
INTRAMUSCULAR | Status: DC | PRN
  Administered 2017-01-02: 100 [IU] via INTRAMUSCULAR

## 2017-01-02 MED ORDER — LIDOCAINE 2% (20 MG/ML) 5 ML SYRINGE
INTRAMUSCULAR | Status: AC
  Filled 2017-01-02: qty 5

## 2017-01-02 MED ORDER — EPHEDRINE SULFATE 50 MG/ML IJ SOLN
INTRAMUSCULAR | Status: DC | PRN
  Administered 2017-01-02: 10 mg via INTRAVENOUS

## 2017-01-02 MED ORDER — SUCCINYLCHOLINE CHLORIDE 20 MG/ML IJ SOLN
INTRAMUSCULAR | Status: DC | PRN
  Administered 2017-01-02: 100 mg via INTRAVENOUS

## 2017-01-02 MED ORDER — DEXAMETHASONE SODIUM PHOSPHATE 4 MG/ML IJ SOLN
INTRAMUSCULAR | Status: DC | PRN
  Administered 2017-01-02: 10 mg via INTRAVENOUS

## 2017-01-02 MED ORDER — ONDANSETRON HCL 4 MG/2ML IJ SOLN
INTRAMUSCULAR | Status: DC | PRN
  Administered 2017-01-02: 4 mg via INTRAVENOUS

## 2017-01-02 MED ORDER — SUCCINYLCHOLINE CHLORIDE 200 MG/10ML IV SOSY
PREFILLED_SYRINGE | INTRAVENOUS | Status: AC
  Filled 2017-01-02: qty 10

## 2017-01-02 MED ORDER — DEXAMETHASONE SODIUM PHOSPHATE 10 MG/ML IJ SOLN
INTRAMUSCULAR | Status: AC
  Filled 2017-01-02: qty 1

## 2017-01-02 MED ORDER — LACTATED RINGERS IV SOLN
INTRAVENOUS | Status: DC
  Administered 2017-01-02 (×2): via INTRAVENOUS

## 2017-01-02 MED ORDER — ONDANSETRON HCL 4 MG/2ML IJ SOLN
INTRAMUSCULAR | Status: AC
  Filled 2017-01-02: qty 2

## 2017-01-02 MED ORDER — ONABOTULINUMTOXINA 100 UNITS IJ SOLR
INTRAMUSCULAR | Status: AC
  Filled 2017-01-02: qty 100

## 2017-01-02 MED ORDER — PROPOFOL 10 MG/ML IV BOLUS
INTRAVENOUS | Status: DC | PRN
  Administered 2017-01-02: 150 mg via INTRAVENOUS

## 2017-01-02 MED ORDER — SODIUM CHLORIDE 0.9 % IV SOLN: INTRAVENOUS | Status: DC

## 2017-01-02 SURGICAL SUPPLY — 14 items

## 2017-01-02 NOTE — Anesthesia Postprocedure Evaluation (Signed)
Anesthesia Post Note  Patient: [name]  Procedure(s) Performed: Procedure(s) (LRB): ESOPHAGOGASTRODUODENOSCOPY (EGD) WITH PROPOFOL (N/A) BOTOX INJECTION (N/A)     Patient location during evaluation: PACU Anesthesia Type: MAC Level of consciousness: sedated and patient cooperative Pain management: pain level controlled Vital Signs Assessment: post-procedure vital signs reviewed and stable Respiratory status: spontaneous breathing Cardiovascular status: stable Anesthetic complications: no    Last Vitals:  Vitals:   01/02/17 0831 01/02/17 0845  BP: (!) 133/57 (!) 113/54  Pulse: (!) 59 64  Resp: 16 19  Temp: 36.6 C   SpO2: 100% 100%    Last Pain:  Vitals:   01/02/17 0831  TempSrc: Oral                 Nolon Nations

## 2017-01-02 NOTE — Interval H&P Note (Signed)
History and Physical Interval Note:  01/02/2017 7:21 AM  [name]  has presented today for surgery, with the diagnosis of dysphagia  The various methods of treatment have been discussed with the patient and family. After consideration of risks, benefits and other options for treatment, the patient has consented to  Procedure(s): ESOPHAGOGASTRODUODENOSCOPY (EGD) WITH PROPOFOL (N/A) BOTOX INJECTION (N/A) as a surgical intervention .  The patient's history has been reviewed, patient examined, no change in status, stable for surgery.  I have reviewed the patient's chart and labs.  Questions were answered to the patient's satisfaction.     [place]

## 2017-01-02 NOTE — Op Note (Signed)
[facility] Patient Name: [name] Procedure Date: 01/02/2017 MRN: [id number] Attending MD: Carlota Raspberry. Kian Ottaviano MD, MD Date of Birth: [birth year] CSN: [id number] Age: 76 Admit Type: Outpatient Procedure:                Upper GI endoscopy Indications:              For management of achalasia, history of food                            impaction, 2 treatments with Botox of the LES (last                            in January 2018), now with worsening dysphagia Providers:                Remo Lipps P. Ritik Stavola MD, MD, Carmie End, RN,                            Cherylynn Ridges, Technician Referring MD:              Medicines:                Monitored Anesthesia Care Complications:            No immediate complications. Estimated blood loss:                            Minimal. Estimated Blood Loss:     Estimated blood loss was minimal. Procedure:                Pre-Anesthesia Assessment:                           - Prior to the procedure, a History and Physical                            was performed, and patient medications and                            allergies were reviewed. The patient's tolerance of                            previous anesthesia was also reviewed. The risks                            and benefits of the procedure and the sedation                            options and risks were discussed with the patient.                            All questions were answered, and informed consent                            was obtained. Prior Anticoagulants: The patient has  taken no previous anticoagulant or antiplatelet                            agents. ASA Grade Assessment: III - A patient with                            severe systemic disease. After reviewing the risks                            and benefits, the patient was deemed in                            satisfactory condition to undergo the procedure.           After obtaining informed consent, the endoscope was                            passed under direct vision. Throughout the                            procedure, the patient's blood pressure, pulse, and                            oxygen saturations were monitored continuously. The                            ZO-[id number]) scope was introduced through the                            mouth, and advanced to the second part of duodenum.                            The upper GI endoscopy was accomplished without                            difficulty. The patient tolerated the procedure                            well. Scope In: Scope Out: Findings:      Esophagogastric landmarks were identified: the Z-line was found at 42       cm, the gastroesophageal junction was found at 42 cm and the upper       extent of the gastric folds was found at 45 cm from the incisors.      A 3 cm hiatal hernia was present.      The lumen of the esophagus was severely dilated consistent with known       achalasia. A very small amount of retained fluid was noted, but nothing       significant. No obstruction.      The LES was hypertonic with resistance with passing the endoscope. A TTS       dilator was passed through the scope. Dilation with an 18-19-20 mm       balloon dilator was performed to 20 mm with good result.      Esophagitis due to stasis was found in the  distal esophagus. Biopsies       were taken with a cold forceps for histology to ensure no dysplastic       changes.      The area at the lower esophageal sphincter (within 1cm of SCJ) was       successfully injected with 100 units botulinum toxin (25 units for each       quadrant).      Multiple 4 to 5 mm sessile polyps were found in the gastric body.       Biopsies were taken with a cold forceps for histology.      The exam of the stomach was otherwise normal. Retroflexed views of the       cardia were normal.      The duodenal bulb and  second portion of the duodenum were normal. Impression:               - Esophagogastric landmarks identified.                           - 3 cm hiatal hernia.                           - Dilation in the entire esophagus consistent with                            achalasia.                           - Hypertonic LES. Dilated to 63m, injected with                            100 units of Botox.                           - Esophagitis due to stasis. Biopsied.                           - Multiple gastric polyps. Biopsied.                           - Normal duodenal bulb and second portion of the                            duodenum. Moderate Sedation:      No moderate sedation, case performed with MAC Recommendation:           - Patient has a contact number available for                            emergencies. The signs and symptoms of potential                            delayed complications were discussed with the                            patient. Return to normal activities tomorrow.  Written discharge instructions were provided to the                            patient.                           - Liquid / pureed diet.                           - Continue present medications (protonix)                           - Await pathology results.                           - Follow up with Dr. Silverio Decamp in our office for                            discussion of management options for achalasia Procedure Code(s):        --- Professional ---                           [phone], Esophagogastroduodenoscopy, flexible,                            transoral; with transendoscopic balloon dilation of                            esophagus (less than 30 mm diameter)                           43239, Esophagogastroduodenoscopy, flexible,                            transoral; with biopsy, single or multiple                           43236, 59, Esophagogastroduodenoscopy, flexible,                             transoral; with directed submucosal injection(s),                            any substance Diagnosis Code(s):        --- Professional ---                           K44.9, Diaphragmatic hernia without obstruction or                            gangrene                           K22.8, Other specified diseases of esophagus                           K22.2, Esophageal obstruction  K20.9, Esophagitis, unspecified                           K31.7, Polyp of stomach and duodenum                           K22.0, Achalasia of cardia CPT copyright 2016 American [employer] Association. All rights reserved. The codes documented in this report are preliminary and upon coder review may  be revised to meet current compliance requirements. Remo Lipps P. Jahad Old MD, MD 01/02/2017 8:33:04 AM This report has been signed electronically. Number of Addenda: 0

## 2017-01-02 NOTE — Anesthesia Procedure Notes (Signed)
Procedure Name: Intubation Date/Time: 01/02/2017 7:57 AM Performed by: Claudia Desanctis Pre-anesthesia Checklist: Patient identified, Emergency Drugs available, Suction available and Patient being monitored Patient Re-evaluated:Patient Re-evaluated prior to induction Oxygen Delivery Method: Circle system utilized Preoxygenation: Pre-oxygenation with 100% oxygen Induction Type: IV induction, Rapid sequence and Cricoid Pressure applied Ventilation: Mask ventilation without difficulty Laryngoscope Size: 2 and [place] Grade View: Grade I Tube type: Oral Tube size: 7.5 mm Number of attempts: 1 Airway Equipment and Method: Stylet Placement Confirmation: ETT inserted through vocal cords under direct vision,  positive ETCO2 and breath sounds checked- equal and bilateral Secured at: 22 cm Tube secured with: Tape Dental Injury: Teeth and Oropharynx as per pre-operative assessment

## 2017-01-02 NOTE — Discharge Instructions (Signed)
YOU HAD AN ENDOSCOPIC PROCEDURE TODAY: Refer to the procedure report and other information in the discharge instructions given to you for any specific questions about what was found during the examination. If this information does not answer your questions, please call [place] office at [phone] to clarify.  ° °YOU SHOULD EXPECT: Some feelings of bloating in the abdomen. Passage of more gas than usual. Walking can help get rid of the air that was put into your GI tract during the procedure and reduce the bloating. If you had a lower endoscopy (such as a colonoscopy or flexible sigmoidoscopy) you may notice spotting of blood in your stool or on the toilet paper. Some abdominal soreness may be present for a day or two, also. ° °DIET: Your first meal following the procedure should be a light meal and then it is ok to progress to your normal diet. A half-sandwich or bowl of soup is an example of a good first meal. Heavy or fried foods are harder to digest and may make you feel nauseous or bloated. Drink plenty of fluids but you should avoid alcoholic beverages for 24 hours. If you had a esophageal dilation, please see attached instructions for diet.   ° °ACTIVITY: Your care partner should take you home directly after the procedure. You should plan to take it easy, moving slowly for the rest of the day. You can resume normal activity the day after the procedure however YOU SHOULD NOT DRIVE, use power tools, machinery or perform tasks that involve climbing or major physical exertion for 24 hours (because of the sedation medicines used during the test).  ° °SYMPTOMS TO REPORT IMMEDIATELY: °A gastroenterologist can be reached at any hour. Please call [phone]  for any of the following symptoms:  °Following lower endoscopy (colonoscopy, flexible sigmoidoscopy) °Excessive amounts of blood in the stool  °Significant tenderness, worsening of abdominal pains  °Swelling of the abdomen that is new, acute  °Fever of 100° or  higher  °Following upper endoscopy (EGD, EUS, ERCP, esophageal dilation) °Vomiting of blood or coffee ground material  °New, significant abdominal pain  °New, significant chest pain or pain under the shoulder blades  °Painful or persistently difficult swallowing  °New shortness of breath  °Black, tarry-looking or red, bloody stools ° °FOLLOW UP:  °If any biopsies were taken you will be contacted by phone or by letter within the next 1-3 weeks. Call [phone]  if you have not heard about the biopsies in 3 weeks.  °Please also call with any specific questions about appointments or follow up tests. ° °

## 2017-01-02 NOTE — Transfer of Care (Signed)
Immediate Anesthesia Transfer of Care Note  Patient: [name]  Procedure(s) Performed: Procedure(s): ESOPHAGOGASTRODUODENOSCOPY (EGD) WITH PROPOFOL (N/A) BOTOX INJECTION (N/A)  Patient Location: PACU  Anesthesia Type:General  Level of Consciousness:  sedated, patient cooperative and responds to stimulation  Airway & Oxygen Therapy:Patient Spontanous Breathing and Patient connected to face mask oxgen  Post-op Assessment:  Report given to PACU RN and Post -op Vital signs reviewed and stable  Post vital signs:  Reviewed and stable  Last Vitals:  Vitals:   01/02/17 0704  BP: (!) 146/82  Resp: (!) 25  Temp: 36.4 C  SpO2: 435%    Complications: No apparent anesthesia complications

## 2017-01-02 NOTE — H&P (Signed)
HPI:   [name] is a 76 y.o. male with a history of achalasia s/p Botox injection in the past for food impactions / retained food in the esophagus. He is compliant with a pureed diet. His wife called yesterday concerning for worsening symptoms. He states he has no trouble with liquids and staying on pureed diet. He thinks dysphagia is worsening over time but wanting to have another EGD to "prevent what happened in the past". He does not think he is obstructed. Can handle liquids and his secretions. Last EGD in January with Dr. Carlean Purl for food impaction.  Past Medical History:  Diagnosis Date  . Anemia    IRON TRANSFUSION 1 MONTH AGO  . Blood clot in vein 2008   LEFT LEG  . Cataract    LEFT  . CKD (chronic kidney disease) 02/2016  . Diabetes mellitus, type 2 ([place]) 2008  . GERD (gastroesophageal reflux disease)   . Hyperlipidemia 2008  . Hypertension 2008  . Pneumonia 01/18/2016  . Prostate cancer ([place]) 2006   IMPLANT IN LEFT ARM FOR [place]    Past Surgical History:  Procedure Laterality Date  . BALLOON DILATION N/A 04/13/2016   Procedure: BALLOON DILATION;  Surgeon: Gatha Mayer, MD;  Location: [facility] ENDOSCOPY;  Service: Endoscopy;  Laterality: N/A;  . BOTOX INJECTION N/A 04/13/2016   Procedure: BOTOX INJECTION;  Surgeon: Gatha Mayer, MD;  Location: [place];  Service: Endoscopy;  Laterality: N/A;  . COLONOSCOPY W/ POLYPECTOMY  03/2008   diminutive rectal polyp (path: prolapse type polyp, not adenoma).  moderate pan-diverticulitis.   Marland Kitchen ESOPHAGEAL MANOMETRY  1984   unable to pass manometry catheter beyond LES. Aperiystalsis of esophagus: sugg of achalasia.   Marland Kitchen ESOPHAGOGASTRODUODENOSCOPY N/A 02/17/2016   Procedure: ESOPHAGOGASTRODUODENOSCOPY (EGD);  Surgeon: [name], MD;  Location: [facility];  Service: Endoscopy;  Laterality: N/A;  . ESOPHAGOGASTRODUODENOSCOPY (EGD) WITH PROPOFOL N/A 02/21/2016   Procedure: ESOPHAGOGASTRODUODENOSCOPY (EGD) WITH PROPOFOL;   Surgeon: Jerene Bears, MD;  Location: [facility] ENDOSCOPY;  Service: Gastroenterology;  Laterality: N/A;  . ESOPHAGOGASTRODUODENOSCOPY (EGD) WITH PROPOFOL N/A 02/28/2016   Procedure: ESOPHAGOGASTRODUODENOSCOPY (EGD) WITH PROPOFOL withBOTOX;  Surgeon: Manus Gunning, MD;  Location: [facility] ENDOSCOPY;  Service: Gastroenterology;  Laterality: N/A;  . ESOPHAGOGASTRODUODENOSCOPY (EGD) WITH PROPOFOL N/A 04/13/2016   Procedure: ESOPHAGOGASTRODUODENOSCOPY (EGD) WITH PROPOFOL;  Surgeon: Gatha Mayer, MD;  Location: [place];  Service: Endoscopy;  Laterality: N/A;  . LEFT ARM IMPLANT X2     RADIATION TX ALSO    Family History  Problem Relation Age of Onset  . Diabetes Father   . Diabetes Sister   . Hypertension Sister   . Hypertension Brother   . Heart disease Neg Hx      Social History  Substance Use Topics  . Smoking status: Former Smoker    Years: 10.00    Quit date: 2000  . Smokeless tobacco: Never Used  . Alcohol use No    Prior to Admission medications   Medication Sig Start Date End Date Taking? Authorizing Provider  Amino Acids-Protein Hydrolys (FEEDING SUPPLEMENT, PRO-STAT SUGAR FREE 64,) LIQD Take 30 mLs by mouth 2 (two) times daily. Patient taking differently: Take 30 mLs by mouth daily.  04/13/16  Yes Dessa Phi Chahn-Yang, DO  atenolol (TENORMIN) 25 MG tablet Take 1 tablet (25 mg total) by mouth daily. 07/09/16  Yes Elayne Snare, MD  atorvastatin (LIPITOR) 10 MG tablet Take 1 tablet (10 mg total) by mouth  at bedtime. 07/09/16  Yes Elayne Snare, MD  docusate sodium (COLACE) 100 MG capsule TAKE ONE CAPSULE BY MOUTH TWICE A DAY FOR CONSTIPATION Patient taking differently: TAKE ONE CAPSULE BY MOUTH TWICE A DAY 07/04/16  Yes Elayne Snare, MD  enzalutamide Gillermina Phy) 40 MG capsule Take 160 mg by mouth daily. 1700   Yes [provider]  ferrous sulfate 325 (65 FE) MG tablet TAKE 1 TABLET BY MOUTH EVERY DAY FOR SUPPLEMENT 11/27/16  Yes Elayne Snare, MD  furosemide (LASIX) 80 MG tablet  Take 80 mg by mouth daily. 12/26/16  Yes [provider]  glipiZIDE (GLUCOTROL XL) 5 MG 24 hr tablet Take 1 tablet (5 mg total) by mouth daily with breakfast. 12/28/16  Yes Elayne Snare, MD  Multiple Vitamin (MULTIVITAMIN WITH MINERALS) TABS tablet Take 1 tablet by mouth daily.   Yes [provider]  pantoprazole (PROTONIX) 40 MG tablet Take 1 tablet (40 mg total) by mouth 2 (two) times daily. 07/09/16  Yes Elayne Snare, MD  pioglitazone (ACTOS) 15 MG tablet Take 1 tablet (15 mg total) by mouth daily. 11/19/16  Yes Elayne Snare, MD  TRADJENTA 5 MG TABS tablet TAKE 1 TABLET (5 MG TOTAL) BY MOUTH DAILY. 09/25/16  Yes Elayne Snare, MD  ACCU-CHEK FASTCLIX LANCETS MISC Use to check blood sugars twice daily E11.65 11/08/16   Elayne Snare, MD  acetaminophen (TYLENOL) 650 MG CR tablet Take 650 mg by mouth every 4 (four) hours as needed for pain.    [provider]  glucose blood test strip Use as instructed 11/08/16   Elayne Snare, MD    Current Facility-Administered Medications  Medication Dose Route Frequency Provider Last Rate Last Dose  . lactated ringers infusion   Intravenous Continuous Dorathy Stallone, Carlota Raspberry, MD        Allergies as of 01/01/2017  . (No Known Allergies)     Review of Systems:    As per HPI, otherwise negative    Physical Exam:  Vital signs in last 24 hours: Temp:  [97.6 F (36.4 C)] 97.6 F (36.4 C) (09/26 0704) Resp:  [25] 25 (09/26 0704) BP: (146)/(82) 146/82 (09/26 0704) SpO2:  [100 %] 100 % (09/26 0704) Weight:  [130 lb (59 kg)] 130 lb (59 kg) (09/26 0704)   General:   Pleasant male in NAD Lungs:  Respirations even and unlabored. Lungs clear to auscultation bilaterally.   No wheezes, crackles, or rhonchi.  Heart:  Regular rate and rhythm; no MRG Abdomen:  Soft, nondistended, nontender.  No appreciable masses or hepatomegaly.  Extremities:  Without edema. Neurologic:  Alert and  oriented x4;  grossly normal neurologically. Psych:  Alert and  cooperative. Normal affect.  LAB RESULTS: No results for input(s): WBC, HGB, HCT, PLT in the last 72 hours. BMET No results for input(s): NA, K, CL, CO2, GLUCOSE, BUN, CREATININE, CALCIUM in the last 72 hours. LFT No results for input(s): PROT, ALBUMIN, AST, ALT, ALKPHOS, BILITOT, BILIDIR, IBILI in the last 72 hours. PT/INR No results for input(s): LABPROT, INR in the last 72 hours.  STUDIES: No results found.   PREVIOUS ENDOSCOPIES:               Impression / Plan:  76 y/o male with achalasia s/p Botox injections in the past, last in January, here for EGD for worsening dysphagia. He is not impacted and can handle his secretions / liquids. Plan for EGD with Botox injection in hope of improving symptoms (he has responded well to this in  the past) and minimizing risk of recurrent impaction. I have discussed risks / benefits of EGD with anesthesia, he wished to proceed. Further recommendations pending the results.    [place], MD [facility] Pager [phone]

## 2017-01-03 ENCOUNTER — Encounter (HOSPITAL_COMMUNITY): Payer: Self-pay | Admitting: Gastroenterology

## 2017-01-08 ENCOUNTER — Encounter: Payer: Self-pay | Admitting: Gastroenterology

## 2017-01-18 ENCOUNTER — Other Ambulatory Visit: Payer: Self-pay | Admitting: Endocrinology

## 2017-01-18 ENCOUNTER — Encounter: Payer: Self-pay | Admitting: Family

## 2017-01-18 ENCOUNTER — Ambulatory Visit: Payer: Medicare Other | Admitting: Family

## 2017-01-18 ENCOUNTER — Ambulatory Visit (INDEPENDENT_AMBULATORY_CARE_PROVIDER_SITE_OTHER)
Admission: RE | Admit: 2017-01-18 | Discharge: 2017-01-18 | Disposition: A | Discharge status: Home / Self Care | Payer: Medicare Other | Source: Ambulatory Visit | Attending: Family | Admitting: Family

## 2017-01-18 ENCOUNTER — Ambulatory Visit (INDEPENDENT_AMBULATORY_CARE_PROVIDER_SITE_OTHER): Payer: Medicare Other | Admitting: Family

## 2017-01-18 ENCOUNTER — Other Ambulatory Visit (INDEPENDENT_AMBULATORY_CARE_PROVIDER_SITE_OTHER): Payer: Medicare Other

## 2017-01-18 VITALS — BP 118/70 | HR 102 | Temp 97.5°F | Resp 16 | Ht 68.0 in

## 2017-01-18 DIAGNOSIS — R531 Weakness: Secondary | ICD-10-CM

## 2017-01-18 DIAGNOSIS — R05 Cough: Secondary | ICD-10-CM | POA: Diagnosis not present

## 2017-01-18 LAB — COMPREHENSIVE METABOLIC PANEL
ALK PHOS: 83 U/L (ref 39–117)
ALT: 8 U/L (ref 0–53)
AST: 16 U/L (ref 0–37)
Albumin: 3.5 g/dL (ref 3.5–5.2)
BUN: 74 mg/dL — AB (ref 6–23)
CHLORIDE: 99 meq/L (ref 96–112)
CO2: 26 meq/L (ref 19–32)
Calcium: 9.7 mg/dL (ref 8.4–10.5)
Creatinine, Ser: 2.81 mg/dL — ABNORMAL HIGH (ref 0.40–1.50)
GFR: 28.37 mL/min — AB (ref >60.00)
GLUCOSE: 252 mg/dL — AB (ref 70–99)
POTASSIUM: 3.4 meq/L — AB (ref 3.5–5.1)
Sodium: 146 mEq/L — ABNORMAL HIGH (ref 135–145)
Total Bilirubin: 0.5 mg/dL (ref 0.2–1.2)
Total Protein: 6.9 g/dL (ref 6.0–8.3)

## 2017-01-18 LAB — CBC WITH DIFFERENTIAL/PLATELET
BASOS PCT: 0.2 % (ref 0.0–3.0)
Basophils Absolute: 0 10*3/uL (ref 0.0–0.1)
EOS PCT: 0.4 % (ref 0.0–5.0)
Eosinophils Absolute: 0 10*3/uL (ref 0.0–0.7)
HCT: 34.7 % — ABNORMAL LOW (ref 39.0–52.0)
Hemoglobin: 10.9 g/dL — ABNORMAL LOW (ref 13.0–17.0)
LYMPHS ABS: 1.2 10*3/uL (ref 0.7–4.0)
Lymphocytes Relative: 10.7 % — ABNORMAL LOW (ref 12.0–46.0)
MCHC: 31.4 g/dL (ref 30.0–36.0)
MCV: 96.5 fl (ref 78.0–100.0)
MONOS PCT: 4.9 % (ref 3.0–12.0)
Monocytes Absolute: 0.5 10*3/uL (ref 0.1–1.0)
NEUTROS ABS: 9.4 10*3/uL — AB (ref 1.4–7.7)
Platelets: 263 10*3/uL (ref 150.0–400.0)
RBC: 3.6 Mil/uL — ABNORMAL LOW (ref 4.22–5.81)
RDW: 15.9 % — AB (ref 11.5–15.5)
WBC: 11.2 10*3/uL — ABNORMAL HIGH (ref 4.0–10.5)

## 2017-01-18 MED ORDER — ONDANSETRON 4 MG PO TBDP: 4.0000 mg | ORAL_TABLET | Freq: Three times a day (TID) | ORAL | 0 refills | Status: AC | PRN

## 2017-01-18 NOTE — Patient Instructions (Addendum)
Thank you for choosing [facility].  SUMMARY AND INSTRUCTIONS:  Please start taking the ondansetron as needed for nausea.   Continue with purree / liquid diet.   We will check your blood work today.  If your symptoms worsen go to the ED.    Medication:  Your prescription(s) have been submitted to your pharmacy or been printed and provided for you. Please take as directed and contact our office if you believe you are having problem(s) with the medication(s) or have any questions. Labs:  Please stop by the lab on the lower level of the building for your blood work. Your results will be released to [place] (or called to you) after review, usually within 72 hours after test completion. If any changes need to be made, you will be notified at that same time.  1.) The lab is open from 7:30am to 5:30 pm Monday-Friday 2.) No appointment is necessary 3.) Fasting (if needed) is 6-8 hours after food and drink; black coffee and water are okay   Imaging / Radiology:  Please stop by radiology on the basement level of the building for your x-rays. Your results will be released to [place] (or called to you) after review, usually within 72 hours after test completion. If any treatments or changes are necessary, you will be notified at that same time.  Referrals:  Referrals have been made during this visit. You should expect to hear back from our schedulers in about 7-10 days in regards to establishing an appointment with the specialists we discussed.   Follow up:  If your symptoms worsen or fail to improve, please contact our office for further instruction, or in case of emergency go directly to the emergency room at the closest medical facility.

## 2017-01-18 NOTE — Assessment & Plan Note (Addendum)
Unable to obtain a urine sample. Concern for dehydration and possible viral infection. Cannot rule out possible aspiration given previous achalasia. Reassuring with no fevers but does have increased pulse rate. Obtain CBC w/diff, CMET, and chest x-ray. Start ondansetron for nausea. Advised to seek emergency care if symptoms worsen prior to blood work results.  Note: Patient family member was rather upset about having to wait 2 hours when patient was a walk in appointment. Spent approximately 20 minutes in the bathroom on initial attempt to see patient.

## 2017-01-18 NOTE — Progress Notes (Signed)
Subjective:    Patient ID: [name], male    DOB: 14-Apr-1940, 76 y.o.   MRN: [id number]  Chief Complaint  Patient presents with  . Weakness    having weakness and not eating    HPI:  [name] is a 76 y.o. male who  has a past medical history of Anemia; Blood clot in vein (2008); Cataract; CKD (chronic kidney disease) (02/2016); Diabetes mellitus, type 2 ([place]) (2008); GERD (gastroesophageal reflux disease); Hyperlipidemia (2008); Hypertension (2008); Pneumonia (01/18/2016); and Prostate cancer ([place]) (2006). and presents today for an acute office visit.  Continues to experience the associated symptom of decreased appetite and eating in the setting of achalasia. 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 Recommendations were to continue with current medications and consume a pureed / liquid diet. Not eating well. Currently eating 2 Ensures/Boosts per day with some milkshakes and apple juice. This has been going on for a couple of days. Did have one episode of vomiting this morning. Denies constipation or diarrhea.     Wt Readings from Last 3 Encounters:  01/02/17 130 lb (59 kg)  12/12/16 140 lb (63.5 kg)  12/04/16 140 lb (63.5 kg)     No Known Allergies    Outpatient Medications Prior to Visit  Medication Sig Dispense Refill  . ACCU-CHEK FASTCLIX LANCETS MISC Use to check blood sugars twice daily E11.65 180 each 0  . acetaminophen (TYLENOL) 650 MG CR tablet Take 650 mg by mouth every 4 (four) hours as needed for pain.    . Amino Acids-Protein Hydrolys (FEEDING SUPPLEMENT, PRO-STAT SUGAR FREE 64,) LIQD Take 30 mLs by mouth 2 (two) times daily. (Patient taking differently: Take 30 mLs by mouth daily. ) 900 mL 0  . atenolol (TENORMIN) 25 MG tablet Take 1 tablet (25 mg total) by mouth daily. 90 tablet 1  . atorvastatin (LIPITOR) 10 MG tablet Take 1 tablet (10 mg total) by mouth at bedtime. 90 tablet 1  .  docusate sodium (COLACE) 100 MG capsule TAKE ONE CAPSULE BY MOUTH TWICE A DAY FOR CONSTIPATION (Patient taking differently: TAKE ONE CAPSULE BY MOUTH TWICE A DAY) 60 capsule 0  . enzalutamide (XTANDI) 40 MG capsule Take 160 mg by mouth daily. 1700    . ferrous sulfate 325 (65 FE) MG tablet TAKE 1 TABLET BY MOUTH EVERY DAY FOR SUPPLEMENT 90 tablet 0  . furosemide (LASIX) 80 MG tablet Take 80 mg by mouth daily.    Marland Kitchen glipiZIDE (GLUCOTROL XL) 5 MG 24 hr tablet Take 1 tablet (5 mg total) by mouth daily with breakfast. 90 tablet 1  . glucose blood test strip Use as instructed 100 each 12  . Multiple Vitamin (MULTIVITAMIN WITH MINERALS) TABS tablet Take 1 tablet by mouth daily.    . pantoprazole (PROTONIX) 40 MG tablet Take 1 tablet (40 mg total) by mouth 2 (two) times daily. 180 tablet 1  . pioglitazone (ACTOS) 15 MG tablet Take 1 tablet (15 mg total) by mouth daily. 90 tablet 2  . TRADJENTA 5 MG TABS tablet TAKE 1 TABLET (5 MG TOTAL) BY MOUTH DAILY. 90 tablet 0   No facility-administered medications prior to visit.       Past Surgical History:  Procedure Laterality Date  . BALLOON DILATION N/A 04/13/2016   Procedure: BALLOON DILATION;  Surgeon: Gatha Mayer, MD;  Location: [facility] ENDOSCOPY;  Service: Endoscopy;  Laterality: N/A;  . BOTOX INJECTION N/A  04/13/2016   Procedure: BOTOX INJECTION;  Surgeon: Gatha Mayer, MD;  Location: [facility] ENDOSCOPY;  Service: Endoscopy;  Laterality: N/A;  . BOTOX INJECTION N/A 01/02/2017   Procedure: BOTOX INJECTION;  Surgeon: Yetta Flock, MD;  Location: [facility] ENDOSCOPY;  Service: Gastroenterology;  Laterality: N/A;  . COLONOSCOPY W/ POLYPECTOMY  03/2008   diminutive rectal polyp (path: prolapse type polyp, not adenoma).  moderate pan-diverticulitis.   Marland Kitchen ESOPHAGEAL MANOMETRY  1984   unable to pass manometry catheter beyond LES. Aperiystalsis of esophagus: sugg of achalasia.   Marland Kitchen ESOPHAGOGASTRODUODENOSCOPY N/A 02/17/2016   Procedure: ESOPHAGOGASTRODUODENOSCOPY  (EGD);  Surgeon: [name], MD;  Location: [facility] ENDOSCOPY;  Service: Endoscopy;  Laterality: N/A;  . ESOPHAGOGASTRODUODENOSCOPY (EGD) WITH PROPOFOL N/A 02/21/2016   Procedure: ESOPHAGOGASTRODUODENOSCOPY (EGD) WITH PROPOFOL;  Surgeon: Jerene Bears, MD;  Location: [facility] ENDOSCOPY;  Service: Gastroenterology;  Laterality: N/A;  . ESOPHAGOGASTRODUODENOSCOPY (EGD) WITH PROPOFOL N/A 02/28/2016   Procedure: ESOPHAGOGASTRODUODENOSCOPY (EGD) WITH PROPOFOL withBOTOX;  Surgeon: Manus Gunning, MD;  Location: [facility] ENDOSCOPY;  Service: Gastroenterology;  Laterality: N/A;  . ESOPHAGOGASTRODUODENOSCOPY (EGD) WITH PROPOFOL N/A 04/13/2016   Procedure: ESOPHAGOGASTRODUODENOSCOPY (EGD) WITH PROPOFOL;  Surgeon: Gatha Mayer, MD;  Location: [facility];  Service: Endoscopy;  Laterality: N/A;  . ESOPHAGOGASTRODUODENOSCOPY (EGD) WITH PROPOFOL N/A 01/02/2017   Procedure: ESOPHAGOGASTRODUODENOSCOPY (EGD) WITH PROPOFOL;  Surgeon: Yetta Flock, MD;  Location: [facility] ENDOSCOPY;  Service: Gastroenterology;  Laterality: N/A;  . LEFT ARM IMPLANT X2     RADIATION TX ALSO      Past Medical History:  Diagnosis Date  . Anemia    IRON TRANSFUSION 1 MONTH AGO  . Blood clot in vein 2008   LEFT LEG  . Cataract    LEFT  . CKD (chronic kidney disease) 02/2016  . Diabetes mellitus, type 2 ([place]) 2008  . GERD (gastroesophageal reflux disease)   . Hyperlipidemia 2008  . Hypertension 2008  . Pneumonia 01/18/2016  . Prostate cancer ([place]) 2006   IMPLANT IN LEFT ARM FOR TX      Review of Systems  Constitutional: Positive for appetite change and fatigue. Negative for chills, diaphoresis and fever.  Respiratory: Negative for chest tightness and shortness of breath.   Cardiovascular: Negative for chest pain and leg swelling.  Gastrointestinal: Positive for nausea and vomiting. Negative for abdominal distention, abdominal pain, anal bleeding, blood in stool, constipation and diarrhea.  Neurological: Positive for  weakness. Negative for dizziness, seizures, syncope, numbness and headaches.      Objective:    BP 118/70 (BP Location: Right Arm, Patient Position: Sitting, Cuff Size: Normal)   Pulse (!) 102   Temp (!) 97.5 F (36.4 C) (Oral)   Resp 16   Ht 5\' 8"  (1.727 m)   SpO2 90%  Nursing note and vital signs reviewed.  Physical Exam  Constitutional: He is oriented to person, place, and time. He appears well-developed and well-nourished. No distress.  Seen "resting" in the wheelchair. Did not open eyes initially and he noted that he was "pretending" to to sleep.   Cardiovascular: Normal rate, regular rhythm, normal heart sounds and intact distal pulses.  Exam reveals no gallop and no friction rub.   No murmur heard. Pulmonary/Chest: Effort normal and breath sounds normal. No respiratory distress. He has no wheezes. He has no rales. He exhibits no tenderness.  Abdominal: Soft. Normal appearance and bowel sounds are normal. He exhibits no mass. There is no tenderness. There is no rigidity, no rebound, no guarding, no tenderness at  McBurney's point and negative Murphy's sign.  Neurological: He is alert and oriented to person, place, and time.  Skin: Skin is warm and dry.  Psychiatric: He has a normal mood and affect. His behavior is normal. Judgment and thought content normal.       Assessment & Plan:   Problem List Items Addressed This Visit      Other   Weakness - Primary    Unable to obtain a urine sample. Concern for dehydration and possible viral infection. Cannot rule out possible aspiration given previous achalasia. Reassuring with no fevers but does have increased pulse rate. Obtain CBC w/diff, CMET, and chest x-ray. Start ondansetron for nausea. Advised to seek emergency care if symptoms worsen prior to blood work results.  Note: Patient family member was rather upset about having to wait 2 hours when patient was a walk in appointment. Spent approximately 20 minutes in the bathroom on  initial attempt to see patient.       Relevant Orders   Comprehensive metabolic panel (Completed)   CBC w/Diff (Completed)   DG Chest 2 View (Completed)       I am having Mr. Umar start on ondansetron. I am also having him maintain his acetaminophen, multivitamin with minerals, feeding supplement (PRO-STAT SUGAR FREE 64), docusate sodium, atorvastatin, pantoprazole, atenolol, glucose blood, ACCU-CHEK FASTCLIX LANCETS, pioglitazone, ferrous sulfate, glipiZIDE, furosemide, enzalutamide, and TRADJENTA.   Meds ordered this encounter  Medications  . ondansetron (ZOFRAN-ODT) 4 MG disintegrating tablet    Sig: Take 1 tablet (4 mg total) by mouth every 8 (eight) hours as needed for nausea or vomiting.    Dispense:  20 tablet    Refill:  0    Order Specific Question:   Supervising Provider    Answer:   Pricilla Holm A [[id number]]     Follow-up: Return if symptoms worsen or fail to improve.  Mauricio Po, FNP

## 2017-01-21 ENCOUNTER — Encounter (HOSPITAL_COMMUNITY): Payer: Self-pay | Admitting: Emergency Medicine

## 2017-01-21 ENCOUNTER — Telehealth: Payer: Self-pay | Admitting: Internal Medicine

## 2017-01-21 ENCOUNTER — Inpatient Hospital Stay (HOSPITAL_COMMUNITY)
Admission: EM | Admit: 2017-01-21 | Discharge: 2017-01-26 | DRG: 682 | Disposition: A | Discharge status: Home Health | Payer: Medicare Other | Attending: Internal Medicine | Admitting: Internal Medicine

## 2017-01-21 DIAGNOSIS — R404 Transient alteration of awareness: Secondary | ICD-10-CM | POA: Diagnosis not present

## 2017-01-21 DIAGNOSIS — E1129 Type 2 diabetes mellitus with other diabetic kidney complication: Secondary | ICD-10-CM | POA: Diagnosis present

## 2017-01-21 DIAGNOSIS — Z8546 Personal history of malignant neoplasm of prostate: Secondary | ICD-10-CM

## 2017-01-21 DIAGNOSIS — E860 Dehydration: Secondary | ICD-10-CM | POA: Diagnosis not present

## 2017-01-21 DIAGNOSIS — I472 Ventricular tachycardia: Secondary | ICD-10-CM | POA: Diagnosis not present

## 2017-01-21 DIAGNOSIS — K220 Achalasia of cardia: Secondary | ICD-10-CM

## 2017-01-21 DIAGNOSIS — J984 Other disorders of lung: Secondary | ICD-10-CM | POA: Diagnosis present

## 2017-01-21 DIAGNOSIS — E876 Hypokalemia: Secondary | ICD-10-CM | POA: Diagnosis present

## 2017-01-21 DIAGNOSIS — N183 Chronic kidney disease, stage 3 unspecified: Secondary | ICD-10-CM

## 2017-01-21 DIAGNOSIS — I5032 Chronic diastolic (congestive) heart failure: Secondary | ICD-10-CM | POA: Diagnosis not present

## 2017-01-21 DIAGNOSIS — R1110 Vomiting, unspecified: Secondary | ICD-10-CM

## 2017-01-21 DIAGNOSIS — Z87891 Personal history of nicotine dependence: Secondary | ICD-10-CM

## 2017-01-21 DIAGNOSIS — C61 Malignant neoplasm of prostate: Secondary | ICD-10-CM

## 2017-01-21 DIAGNOSIS — E1143 Type 2 diabetes mellitus with diabetic autonomic (poly)neuropathy: Secondary | ICD-10-CM | POA: Diagnosis present

## 2017-01-21 DIAGNOSIS — D631 Anemia in chronic kidney disease: Secondary | ICD-10-CM | POA: Diagnosis present

## 2017-01-21 DIAGNOSIS — K219 Gastro-esophageal reflux disease without esophagitis: Secondary | ICD-10-CM | POA: Diagnosis present

## 2017-01-21 DIAGNOSIS — E43 Unspecified severe protein-calorie malnutrition: Secondary | ICD-10-CM

## 2017-01-21 DIAGNOSIS — I952 Hypotension due to drugs: Secondary | ICD-10-CM | POA: Diagnosis present

## 2017-01-21 DIAGNOSIS — Z7984 Long term (current) use of oral hypoglycemic drugs: Secondary | ICD-10-CM

## 2017-01-21 DIAGNOSIS — R531 Weakness: Secondary | ICD-10-CM

## 2017-01-21 DIAGNOSIS — H5440 Blindness, one eye, unspecified eye: Secondary | ICD-10-CM

## 2017-01-21 DIAGNOSIS — E870 Hyperosmolality and hypernatremia: Secondary | ICD-10-CM | POA: Diagnosis present

## 2017-01-21 DIAGNOSIS — I130 Hypertensive heart and chronic kidney disease with heart failure and stage 1 through stage 4 chronic kidney disease, or unspecified chronic kidney disease: Secondary | ICD-10-CM | POA: Diagnosis not present

## 2017-01-21 DIAGNOSIS — E1169 Type 2 diabetes mellitus with other specified complication: Secondary | ICD-10-CM | POA: Diagnosis present

## 2017-01-21 DIAGNOSIS — K3184 Gastroparesis: Secondary | ICD-10-CM | POA: Diagnosis present

## 2017-01-21 DIAGNOSIS — Z23 Encounter for immunization: Secondary | ICD-10-CM

## 2017-01-21 DIAGNOSIS — N184 Chronic kidney disease, stage 4 (severe): Secondary | ICD-10-CM | POA: Diagnosis present

## 2017-01-21 DIAGNOSIS — Z79899 Other long term (current) drug therapy: Secondary | ICD-10-CM

## 2017-01-21 DIAGNOSIS — E1122 Type 2 diabetes mellitus with diabetic chronic kidney disease: Secondary | ICD-10-CM | POA: Diagnosis present

## 2017-01-21 DIAGNOSIS — I959 Hypotension, unspecified: Secondary | ICD-10-CM | POA: Diagnosis not present

## 2017-01-21 DIAGNOSIS — T447X5A Adverse effect of beta-adrenoreceptor antagonists, initial encounter: Secondary | ICD-10-CM | POA: Diagnosis present

## 2017-01-21 DIAGNOSIS — E785 Hyperlipidemia, unspecified: Secondary | ICD-10-CM | POA: Diagnosis present

## 2017-01-21 DIAGNOSIS — Z5329 Procedure and treatment not carried out because of patient's decision for other reasons: Secondary | ICD-10-CM | POA: Diagnosis not present

## 2017-01-21 DIAGNOSIS — I10 Essential (primary) hypertension: Secondary | ICD-10-CM | POA: Diagnosis not present

## 2017-01-21 DIAGNOSIS — M6250 Muscle wasting and atrophy, not elsewhere classified, unspecified site: Secondary | ICD-10-CM | POA: Diagnosis present

## 2017-01-21 DIAGNOSIS — D649 Anemia, unspecified: Secondary | ICD-10-CM | POA: Diagnosis present

## 2017-01-21 DIAGNOSIS — Z681 Body mass index (BMI) 19 or less, adult: Secondary | ICD-10-CM

## 2017-01-21 DIAGNOSIS — N179 Acute kidney failure, unspecified: Secondary | ICD-10-CM | POA: Diagnosis not present

## 2017-01-21 DIAGNOSIS — R627 Adult failure to thrive: Secondary | ICD-10-CM | POA: Diagnosis present

## 2017-01-21 LAB — CBC WITH DIFFERENTIAL/PLATELET
Basophils Absolute: 0 10*3/uL (ref 0.0–0.1)
Basophils Relative: 0 %
Eosinophils Absolute: 0 10*3/uL (ref 0.0–0.7)
Eosinophils Relative: 0 %
HEMATOCRIT: 25 % — AB (ref 39.0–52.0)
HEMOGLOBIN: 8.3 g/dL — AB (ref 13.0–17.0)
LYMPHS ABS: 0.9 10*3/uL (ref 0.7–4.0)
LYMPHS PCT: 11 %
MCH: 31.2 pg (ref 26.0–34.0)
MCHC: 33.2 g/dL (ref 30.0–36.0)
MCV: 94 fL (ref 78.0–100.0)
MONOS PCT: 7 %
Monocytes Absolute: 0.6 10*3/uL (ref 0.1–1.0)
NEUTROS ABS: 7 10*3/uL (ref 1.7–7.7)
NEUTROS PCT: 82 %
Platelets: 211 10*3/uL (ref 150–400)
RBC: 2.66 MIL/uL — AB (ref 4.22–5.81)
RDW: 15 % (ref 11.5–15.5)
WBC: 8.6 10*3/uL (ref 4.0–10.5)

## 2017-01-21 LAB — COMPREHENSIVE METABOLIC PANEL
ALBUMIN: 2.4 g/dL — AB (ref 3.5–5.0)
ALK PHOS: 82 U/L (ref 38–126)
ALT: 9 U/L — ABNORMAL LOW (ref 17–63)
ANION GAP: 14 (ref 5–15)
AST: 16 U/L (ref 15–41)
BILIRUBIN TOTAL: 0.5 mg/dL (ref 0.3–1.2)
BUN: 102 mg/dL — AB (ref 6–20)
CALCIUM: 7.8 mg/dL — AB (ref 8.9–10.3)
CO2: 24 mmol/L (ref 22–32)
Chloride: 113 mmol/L — ABNORMAL HIGH (ref 101–111)
Creatinine, Ser: 3.23 mg/dL — ABNORMAL HIGH (ref 0.61–1.24)
GFR calc Af Amer: 20 mL/min — ABNORMAL LOW (ref >60)
GFR calc non Af Amer: 17 mL/min — ABNORMAL LOW (ref >60)
GLUCOSE: 233 mg/dL — AB (ref 65–99)
POTASSIUM: 3.3 mmol/L — AB (ref 3.5–5.1)
SODIUM: 151 mmol/L — AB (ref 135–145)
TOTAL PROTEIN: 5 g/dL — AB (ref 6.5–8.1)

## 2017-01-21 LAB — CBG MONITORING, ED: GLUCOSE-CAPILLARY: 249 mg/dL — AB (ref 65–99)

## 2017-01-21 MED ORDER — INSULIN ASPART 100 UNIT/ML ~~LOC~~ SOLN
0.0000 [IU] | Freq: Three times a day (TID) | SUBCUTANEOUS | Status: DC
  Administered 2017-01-22 – 2017-01-23 (×4): 2 [IU] via SUBCUTANEOUS

## 2017-01-21 MED ORDER — INSULIN ASPART 100 UNIT/ML ~~LOC~~ SOLN: 0.0000 [IU] | Freq: Every day | SUBCUTANEOUS | Status: DC

## 2017-01-21 MED ORDER — FERROUS SULFATE 325 (65 FE) MG PO TABS
325.0000 mg | ORAL_TABLET | Freq: Every day | ORAL | Status: DC
  Administered 2017-01-22 – 2017-01-26 (×5): 325 mg via ORAL
  Filled 2017-01-21 (×5): qty 1

## 2017-01-21 MED ORDER — ATORVASTATIN CALCIUM 10 MG PO TABS
10.0000 mg | ORAL_TABLET | Freq: Every day | ORAL | Status: DC
  Administered 2017-01-22 – 2017-01-25 (×4): 10 mg via ORAL
  Filled 2017-01-21 (×4): qty 1

## 2017-01-21 MED ORDER — ACETAMINOPHEN 325 MG PO TABS: 650.0000 mg | ORAL_TABLET | ORAL | Status: DC | PRN

## 2017-01-21 MED ORDER — ONDANSETRON HCL 4 MG PO TABS: 4.0000 mg | ORAL_TABLET | Freq: Four times a day (QID) | ORAL | Status: DC | PRN

## 2017-01-21 MED ORDER — HYDRALAZINE HCL 20 MG/ML IJ SOLN: 5.0000 mg | INTRAMUSCULAR | Status: DC | PRN

## 2017-01-21 MED ORDER — SODIUM CHLORIDE 0.9 % IV BOLUS (SEPSIS)
1000.0000 mL | Freq: Once | INTRAVENOUS | Status: AC
Start: 2017-01-21 — End: 2017-01-21
  Administered 2017-01-21: 1000 mL via INTRAVENOUS

## 2017-01-21 MED ORDER — ZOLPIDEM TARTRATE 5 MG PO TABS: 5.0000 mg | ORAL_TABLET | Freq: Every evening | ORAL | Status: DC | PRN

## 2017-01-21 MED ORDER — ENOXAPARIN SODIUM 40 MG/0.4ML ~~LOC~~ SOLN: 40.0000 mg | Freq: Every day | SUBCUTANEOUS | Status: DC

## 2017-01-21 MED ORDER — ADULT MULTIVITAMIN W/MINERALS CH
1.0000 | ORAL_TABLET | Freq: Every day | ORAL | Status: DC
  Administered 2017-01-22 – 2017-01-26 (×5): 1 via ORAL
  Filled 2017-01-21 (×6): qty 1

## 2017-01-21 MED ORDER — PRO-STAT SUGAR FREE PO LIQD
30.0000 mL | Freq: Two times a day (BID) | ORAL | Status: DC
  Administered 2017-01-22 – 2017-01-26 (×8): 30 mL via ORAL
  Filled 2017-01-21 (×8): qty 30

## 2017-01-21 MED ORDER — SODIUM CHLORIDE 0.9 % IV BOLUS (SEPSIS): 500.0000 mL | Freq: Once | INTRAVENOUS | Status: DC

## 2017-01-21 MED ORDER — ENZALUTAMIDE 40 MG PO CAPS
160.0000 mg | ORAL_CAPSULE | Freq: Every day | ORAL | Status: DC
  Administered 2017-01-22 – 2017-01-24 (×3): 160 mg via ORAL

## 2017-01-21 MED ORDER — POTASSIUM CHLORIDE 20 MEQ/15ML (10%) PO SOLN
40.0000 meq | Freq: Once | ORAL | Status: AC
  Administered 2017-01-22: 40 meq via ORAL
  Filled 2017-01-21: qty 30

## 2017-01-21 MED ORDER — ONDANSETRON HCL 4 MG/2ML IJ SOLN: 4.0000 mg | Freq: Four times a day (QID) | INTRAMUSCULAR | Status: DC | PRN

## 2017-01-21 MED ORDER — SODIUM CHLORIDE 0.45 % IV SOLN
INTRAVENOUS | Status: DC
  Administered 2017-01-21 – 2017-01-24 (×4): via INTRAVENOUS

## 2017-01-21 NOTE — Telephone Encounter (Signed)
Called by [employer].  Mr Lagos saw Terri Piedra last week and continues to have generalized weakness, he is not eating or sleeping.  He gets up to go to the bathroom.  He is not taking his meds.  He has not taken his medications.    These symptoms started one month ago.  [facility] advised going to th ED.  They wanted to speak with a doctor.   Called and spoke to his wife.   They have agreed to go to the ED and will take him tonight.

## 2017-01-21 NOTE — ED Triage Notes (Addendum)
Per EMS , pt. From home with complaint of hypotension at around 06:30 this evening   BP 86/75mmhg, per family , pt. Has been feeling weak for the past two weeks now and has very poor appetite. Unable to keep down food. Pt. Has prostate CA. Alert and oriented x4, denied of any pain . Received 250 ML NS IV bolus via EMS, latest BP 129/21mmhg. Denied fever. Denied SOB.

## 2017-01-21 NOTE — ED Provider Notes (Signed)
[facility] DEPT Provider Note   CSN: [id number] Arrival date & time: 01/21/17  1914     History   Chief Complaint Chief Complaint  Patient presents with  . Hypotension    HPI [name] is a 76 y.o. male with history significant for prostate cancer currently undergoing chemotherapy presenting via EMS with 2 weeks of generalized fatigue and decreased appetite. Patient is accompanied by spouse supplementing history. She explains that he has been very fatigued and dozing off to sleep and is refusing to eat. He has been able to eat soup and ice cream over the last two days but nothing else. He does report a slight cough last week and had a chest x-ray a few days ago that was negative for pneumonia. Labs were drawn at that time and was told he had an elevated white count and may have an infection. Denies fever, chills, nausea, vomiting, pain, dysuria, hematuriaor any other symptoms suggestive of infection.  HPI  Past Medical History:  Diagnosis Date  . Anemia    IRON TRANSFUSION 1 MONTH AGO  . Blood clot in vein 2008   LEFT LEG  . Cataract    LEFT  . CKD (chronic kidney disease) 02/2016  . Diabetes mellitus, type 2 ([place]) 2008  . GERD (gastroesophageal reflux disease)   . Hyperlipidemia 2008  . Hypertension 2008  . Pneumonia 01/18/2016  . Prostate cancer ([place]) 2006   IMPLANT IN LEFT ARM FOR TX    Patient Active Problem List   Diagnosis Date Noted  . Hypotension 01/21/2017  . Chronic diastolic CHF (congestive heart failure) ([place]) 01/21/2017  . Chronic kidney disease, stage 3 ([place]) 07/24/2016  . Hypoglycemia 04/11/2016  . Orthostatic hypotension 04/11/2016  . Symptomatic anemia 03/14/2016  . Sepsis ([place]) 02/21/2016  . Heme positive stool   . Pressure injury of skin 02/19/2016  . Hypernatremia 02/19/2016  . Acute esophagitis   . Food impaction of esophagus   . Achalasia   . Acute GI bleeding 02/16/2016  . Acute blood loss anemia  02/16/2016  . Anemia 02/15/2016  . Generalized weakness 01/29/2016  . Right leg weakness 01/29/2016  . HCAP (healthcare-associated pneumonia) 01/28/2016  . Dehydration   . Hypomagnesemia   . Protein-calorie malnutrition, severe 01/09/2016  . Hyperglycemia 01/08/2016  . Hypokalemia 01/08/2016  . Acute renal failure superimposed on stage 3 chronic kidney disease ([place]) 01/08/2016  . Prolonged Q-T interval on ECG 01/08/2016  . 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 Bilateral lower extremity edema 09/07/2013  . Type II or unspecified type diabetes mellitus without mention of complication, uncontrolled 05/20/2013  . Pulmonary embolus ([place]) 09/01/2010  . Chronic deep vein thrombosis (DVT) ([place]) 08/25/2010  . Type II diabetes mellitus with renal manifestations ([place]) 09/15/2006  . Hyperlipidemia associated with type 2 diabetes mellitus ([place]) 09/13/2006  . Essential hypertension 09/13/2006  . Disorder resulting from impaired renal function 09/13/2006  . PROSTATE CANCER, HX OF 09/13/2006    Past Surgical History:  Procedure Laterality Date  . BALLOON DILATION N/A 04/13/2016   Procedure: BALLOON DILATION;  Surgeon: Gatha Mayer, MD;  Location[facility] ENDOSCOPY;  Service: Endoscopy;  Laterality: N/A;  . BOTOX INJECTION N/A 04/13/2016   Procedure: BOTOX INJECTION;  Surgeon: Gatha Mayer, MD;  Location: [facility];  Service: Endoscopy;  Laterality: N/A;  . BOTOX INJECTION N/A 01/02/2017   Procedure: BOTOX INJECTION;  Surgeon: Yetta Flock, MD;  Location: [facility] ENDOSCOPY;  Service: Gastroenterology;  Laterality: N/A;  .  COLONOSCOPY W/ POLYPECTOMY  03/2008   diminutive rectal polyp (path: prolapse type polyp, not adenoma).  moderate pan-diverticulitis.   [facility] ESOPHAGEAL MANOMETRY  1984   unable to pass manometry catheter beyond LES. Aperiystalsis of esophagus: sugg of achalasia.   [facility] Kitchen ESOPHAGOGASTRODUODENOSCOPY N/A 02/17/2016   Procedure: ESOPHAGOGASTRODUODENOSCOPY (EGD);  Surgeon: [name], MD;   Location: [facility];  Service: Endoscopy;  Laterality: N/A;  . ESOPHAGOGASTRODUODENOSCOPY (EGD) WITH PROPOFOL N/A 02/21/2016   Procedure: ESOPHAGOGASTRODUODENOSCOPY (EGD) WITH PROPOFOL;  Surgeon: Jerene Bears, MD;  Location: [facility] ENDOSCOPY;  Service: Gastroenterology;  Laterality: N/A;  . ESOPHAGOGASTRODUODENOSCOPY (EGD) WITH PROPOFOL N/A 02/28/2016   Procedure: ESOPHAGOGASTRODUODENOSCOPY (EGD) WITH PROPOFOL withBOTOX;  Surgeon: Manus Gunning, MD;  Location: [facility] ENDOSCOPY;  Service: Gastroenterology;  Laterality: N/A;  . ESOPHAGOGASTRODUODENOSCOPY (EGD) WITH PROPOFOL N/A 04/13/2016   Procedure: ESOPHAGOGASTRODUODENOSCOPY (EGD) WITH PROPOFOL;  Surgeon: Gatha Mayer, MD;  Location: [facility];  Service: Endoscopy;  Laterality: N/A;  . ESOPHAGOGASTRODUODENOSCOPY (EGD) WITH PROPOFOL N/A 01/02/2017   Procedure: ESOPHAGOGASTRODUODENOSCOPY (EGD) WITH PROPOFOL;  Surgeon: Yetta Flock, MD;  Location: [facility] ENDOSCOPY;  Service: Gastroenterology;  Laterality: N/A;  . LEFT ARM IMPLANT X2     RADIATION TX ALSO       Home Medications    Prior to Admission medications   Medication Sig Start Date End Date Taking? Authorizing Provider  Amino Acids-Protein Hydrolys (FEEDING SUPPLEMENT, PRO-STAT SUGAR FREE 64,) LIQD Take 30 mLs by mouth 2 (two) times daily. Patient taking differently: Take 30 mLs by mouth daily.  04/13/16  Yes Dessa Phi Chahn-Yang, DO  atenolol (TENORMIN) 25 MG tablet Take 1 tablet (25 mg total) by mouth daily. 07/09/16  Yes Elayne Snare, MD  atorvastatin (LIPITOR) 10 MG tablet Take 1 tablet (10 mg total) by mouth at bedtime. 07/09/16  Yes Elayne Snare, MD  docusate sodium (COLACE) 100 MG capsule TAKE ONE CAPSULE BY MOUTH TWICE A DAY FOR CONSTIPATION Patient taking differently: TAKE ONE CAPSULE BY MOUTH TWICE A DAY 07/04/16  Yes Elayne Snare, MD  enzalutamide Gillermina Phy) 40 MG capsule Take 160 mg by mouth daily. 1700   Yes [provider]  ferrous sulfate 325 (65 FE) MG  tablet TAKE 1 TABLET BY MOUTH EVERY DAY FOR SUPPLEMENT 11/27/16  Yes Elayne Snare, MD  furosemide (LASIX) 80 MG tablet Take 80 mg by mouth daily. 12/26/16  Yes [provider]  glipiZIDE (GLUCOTROL XL) 5 MG 24 hr tablet Take 1 tablet (5 mg total) by mouth daily with breakfast. 12/28/16  Yes Elayne Snare, MD  Multiple Vitamin (MULTIVITAMIN WITH MINERALS) TABS tablet Take 1 tablet by mouth daily.   Yes [provider]  pantoprazole (PROTONIX) 40 MG tablet Take 1 tablet (40 mg total) by mouth 2 (two) times daily. 07/09/16  Yes Elayne Snare, MD  pioglitazone (ACTOS) 15 MG tablet Take 1 tablet (15 mg total) by mouth daily. 11/19/16  Yes Elayne Snare, MD  TRADJENTA 5 MG TABS tablet TAKE 1 TABLET (5 MG TOTAL) BY MOUTH DAILY. 01/18/17  Yes Elayne Snare, MD  ACCU-CHEK FASTCLIX LANCETS MISC Use to check blood sugars twice daily E11.65 11/08/16   Elayne Snare, MD  acetaminophen (TYLENOL) 650 MG CR tablet Take 650 mg by mouth every 4 (four) hours as needed for pain.    [provider]  glucose blood test strip Use as instructed 11/08/16   Elayne Snare, MD  ondansetron (ZOFRAN-ODT) 4 MG disintegrating tablet Take 1 tablet (4 mg total) by mouth every 8 (eight) hours as needed  for nausea or vomiting. 01/18/17   [place], FNP    Family History Family History  Problem Relation Age of Onset  . Diabetes Father   . Diabetes Sister   . Hypertension Sister   . Hypertension Brother   . Heart disease Neg Hx     Social History Social History  Substance Use Topics  . Smoking status: Former Smoker    Years: 10.00    Quit date: 2000  . Smokeless tobacco: Never Used  . Alcohol use No     Allergies   Patient has no known allergies.   Review of Systems Review of Systems  Constitutional: Positive for activity change, appetite change and fatigue. Negative for chills and fever.  HENT: Negative for congestion, ear pain and sore throat.   Eyes: Negative for pain and visual disturbance.    Respiratory: Positive for cough. Negative for chest tightness, shortness of breath, wheezing and stridor.   Cardiovascular: Negative for chest pain and palpitations.  Gastrointestinal: Negative for abdominal distention, abdominal pain, blood in stool, diarrhea, nausea and vomiting.  Genitourinary: Negative for difficulty urinating, dysuria, flank pain, frequency and hematuria.  Musculoskeletal: Negative for arthralgias, back pain, myalgias, neck pain and neck stiffness.  Skin: Negative for color change, pallor and rash.  Neurological: Positive for weakness. Negative for dizziness, seizures, syncope, facial asymmetry, speech difficulty, light-headedness, numbness and headaches.       Generalized weakness fatigue     Physical Exam Updated Vital Signs BP 115/72 (BP Location: Right Arm)   Pulse 80   Temp (!) 97.5 F (36.4 C) (Oral)   Resp 19   SpO2 100%   Physical Exam  Constitutional: He is oriented to person, place, and time. He appears well-developed and well-nourished. No distress.  Afebrile, chronically ill-appearing, lying comfortably in no acute distress.  HENT:  Head: Normocephalic and atraumatic.  Mouth/Throat: No oropharyngeal exudate.  Oral mucosa and lips are dry  Eyes: Conjunctivae and EOM are normal.  Neck: Normal range of motion. Neck supple.  Cardiovascular: Normal rate, regular rhythm, normal heart sounds and intact distal pulses.   No murmur heard. Pulmonary/Chest: Effort normal and breath sounds normal. No respiratory distress. He has no wheezes. He has no rales.  Abdominal: Soft. He exhibits no distension and no mass. There is no tenderness. There is no guarding.  Musculoskeletal: Normal range of motion. He exhibits no edema.  Neurological: He is alert and oriented to person, place, and time. No sensory deficit. He exhibits normal muscle tone.  Patient was able to sit himself up in bed using the railing. Good core strength but fatigued easily  Skin: Skin is warm  and dry. No rash noted. He is not diaphoretic. No erythema. No pallor.  Skin tenting appreciated  Psychiatric: He has a normal mood and affect.  Nursing note and vitals reviewed.    ED Treatments / Results  Labs (all labs ordered are listed, but only abnormal results are displayed) Labs Reviewed  CBC WITH DIFFERENTIAL/PLATELET - Abnormal; Notable for the following:       Result Value   RBC 2.66 (*)    Hemoglobin 8.3 (*)    HCT 25.0 (*)    All other components within normal limits  COMPREHENSIVE METABOLIC PANEL - Abnormal; Notable for the following:    Sodium 151 (*)    Potassium 3.3 (*)    Chloride 113 (*)    Glucose, Bld 233 (*)    BUN 102 (*)    Creatinine, Ser  3.23 (*)    Calcium 7.8 (*)    Total Protein 5.0 (*)    Albumin 2.4 (*)    ALT 9 (*)    GFR calc non Af Amer 17 (*)    GFR calc Af Amer 20 (*)    All other components within normal limits  CBG MONITORING, ED - Abnormal; Notable for the following:    Glucose-Capillary 249 (*)    All other components within normal limits  CULTURE, BLOOD (ROUTINE X 2)  CULTURE, BLOOD (ROUTINE X 2)  URINALYSIS, ROUTINE W REFLEX MICROSCOPIC  MAGNESIUM  CREATININE, URINE, RANDOM  UREA NITROGEN, URINE  BRAIN NATRIURETIC PEPTIDE  BASIC METABOLIC PANEL  CBC  BASIC METABOLIC PANEL  CORTISOL-AM, BLOOD   BUN  Date Value Ref Range Status  01/21/2017 102 (H) 6 - 20 mg/dL Final    Comment:    RESULTS CONFIRMED BY MANUAL DILUTION  01/18/2017 74 (H) 6 - 23 mg/dL Final  11/08/2016 57 (H) 6 - 23 mg/dL Final  10/04/2016 48 (H) 6 - 23 mg/dL Final  01/25/2016 23.0 7.0 - 26.0 mg/dL Final   Creatinine  Date Value Ref Range Status  01/25/2016 2.8 (H) 0.7 - 1.3 mg/dL Final   Creatinine, Ser  Date Value Ref Range Status  01/21/2017 3.23 (H) 0.61 - 1.24 mg/dL Final  01/18/2017 2.81 (H) 0.40 - 1.50 mg/dL Final  11/08/2016 2.50 (H) 0.40 - 1.50 mg/dL Final  10/04/2016 3.01 (H) 0.40 - 1.50 mg/dL Final     EKG  EKG Interpretation None        Radiology No results found.  Procedures Procedures (including critical care time)  Medications Ordered in ED Medications  0.45 % sodium chloride infusion ( Intravenous New Bag/Given 01/21/17 2243)  sodium chloride 0.9 % bolus 500 mL (not administered)  enzalutamide (XTANDI) capsule 160 mg (not administered)  ferrous sulfate tablet 325 mg (not administered)  atorvastatin (LIPITOR) tablet 10 mg (not administered)  feeding supplement (PRO-STAT SUGAR FREE 64) liquid 30 mL (not administered)  multivitamin with minerals tablet 1 tablet (not administered)  acetaminophen (TYLENOL) tablet 650 mg (not administered)  potassium chloride 20 MEQ/15ML (10%) solution 40 mEq (not administered)  enoxaparin (LOVENOX) injection 40 mg (not administered)  ondansetron (ZOFRAN) tablet 4 mg (not administered)    Or  ondansetron (ZOFRAN) injection 4 mg (not administered)  hydrALAZINE (APRESOLINE) injection 5 mg (not administered)  zolpidem (AMBIEN) tablet 5 mg (not administered)  insulin aspart (novoLOG) injection 0-9 Units (not administered)  insulin aspart (novoLOG) injection 0-5 Units (not administered)  sodium chloride 0.9 % bolus 1,000 mL (0 mLs Intravenous Stopped 01/21/17 2227)     Initial Impression / Assessment and Plan / ED Course  I have reviewed the triage vital signs and the nursing notes.  Pertinent labs & imaging results that were available during my care of the patient were reviewed by me and considered in my medical decision making (see chart for details).     Patient presents with generalized weakness, fatigue, malnutrition and signs of severe dehydration. Chest x-ray 3 days ago negative for acute cardiopulmonary process  Patient was given IV fluids while in ED  Labs revealed hypernatremia at 151, creatinine 3.23 from 2.81   Spoke to Dr. Blaine Hamper who will be admitting patient.  Final Clinical Impressions(s) / ED Diagnoses   Final diagnoses:  Dehydration    New  Prescriptions New Prescriptions   No medications on file     Dossie Der 01/21/17 2329  Delora Fuel, MD 02/89/02 [phone]

## 2017-01-21 NOTE — H&P (Signed)
History and Physical    [name] ZOX:[id number] DOB: [birth year] DOA: 01/21/2017  Referring MD/NP/PA:   PCP: Golden Circle, FNP   Patient coming from:  The patient is coming from home.  At baseline, pt is partially dependent for most of ADL.   Chief Complaint: generalized weakness  HPI: [name] is a 76 y.o. male with medical history significant of Hypertension, hyperlipidemia, diabetes mellitus, GERD, prostate cancer, CKD-3, anemia, esophageal achalasia (s/p of Botox injection), GI bleeding, dCHF, who presents with generalized weakness.  Per pt's wife,  Has been feeling weak in the past 2 weeks, which has been progressively getting worse. Patient does not have unilateral numbness, tingling in extremities. No facial droop or slurred speech. He also has decreased appetite and oral intake. He has hx of esophageal achalasia, he underwent Botox injection recentl. Currently patient does not have difficulty swallowing or choking on eating food. Patient does not have nausea, vomiting, abdominal pain. He states that has loose stool in the past 2 days, but he is taking Colace.  He had slight cough last week and had a chest x-ray a few days ago that was negative for pneumonia. Patient does not have chest pain, SOB, fever or chills. No symptoms of UTI. Pt was found to have hypotension with blood pressure 86/53 which improved to 129/79 after 2 50 mL of normal saline bolus per EMS.  ED Course: pt was found to have WBC 8.6, sodium 151, potassium is 3.3, worsening renal function, no tachycardia, has tachypnea, oxygen saturation section 96% on room air. Pending UA. Patient is placed on telemetry bed for observation  Review of Systems:   General: no fevers, chills, has poor appetite, has fatigue HEENT: no blurry vision, hearing changes or sore throat Respiratory: no dyspnea, had mild coughing, no wheezing CV: no chest pain, no palpitations GI: no nausea, vomiting, abdominal pain,  constipation. Loose stool bowel movement GU: no dysuria, burning on urination, increased urinary frequency, hematuria  Ext: no leg edema Neuro: no unilateral weakness, numbness, or tingling, no vision change or hearing loss Skin: no rash, no skin tear. MSK: No muscle spasm, no deformity, no limitation of range of movement in spin Heme: No easy bruising.  Travel history: No recent long distant travel.  Allergy: No Known Allergies  Past Medical History:  Diagnosis Date  . Anemia    IRON TRANSFUSION 1 MONTH AGO  . Blood clot in vein 2008   LEFT LEG  . Cataract    LEFT  . CKD (chronic kidney disease) 02/2016  . Diabetes mellitus, type 2 ([place]) 2008  . GERD (gastroesophageal reflux disease)   . Hyperlipidemia 2008  . Hypertension 2008  . Pneumonia 01/18/2016  . Prostate cancer ([place]) 2006   IMPLANT IN LEFT ARM FOR [place]    Past Surgical History:  Procedure Laterality Date  . BALLOON DILATION N/A 04/13/2016   Procedure: BALLOON DILATION;  Surgeon: Gatha Mayer, MD;  Location: [facility] ENDOSCOPY;  Service: Endoscopy;  Laterality: N/A;  . BOTOX INJECTION N/A 04/13/2016   Procedure: BOTOX INJECTION;  Surgeon: Gatha Mayer, MD;  Location: [facility];  Service: Endoscopy;  Laterality: N/A;  . BOTOX INJECTION N/A 01/02/2017   Procedure: BOTOX INJECTION;  Surgeon: Yetta Flock, MD;  Location: [facility] ENDOSCOPY;  Service: Gastroenterology;  Laterality: N/A;  . COLONOSCOPY W/ POLYPECTOMY  03/2008   diminutive rectal polyp (path: prolapse type polyp, not adenoma).  moderate pan-diverticulitis.   [facility] ESOPHAGEAL MANOMETRY  1984   unable  to pass manometry catheter beyond LES. Aperiystalsis of esophagus: sugg of achalasia.   Marland Kitchen ESOPHAGOGASTRODUODENOSCOPY N/A 02/17/2016   Procedure: ESOPHAGOGASTRODUODENOSCOPY (EGD);  Surgeon: [name], MD;  Location: [facility];  Service: Endoscopy;  Laterality: N/A;  . ESOPHAGOGASTRODUODENOSCOPY (EGD) WITH PROPOFOL N/A 02/21/2016   Procedure:  ESOPHAGOGASTRODUODENOSCOPY (EGD) WITH PROPOFOL;  Surgeon: Jerene Bears, MD;  Location: [facility] ENDOSCOPY;  Service: Gastroenterology;  Laterality: N/A;  . ESOPHAGOGASTRODUODENOSCOPY (EGD) WITH PROPOFOL N/A 02/28/2016   Procedure: ESOPHAGOGASTRODUODENOSCOPY (EGD) WITH PROPOFOL withBOTOX;  Surgeon: Manus Gunning, MD;  Location: [facility] ENDOSCOPY;  Service: Gastroenterology;  Laterality: N/A;  . ESOPHAGOGASTRODUODENOSCOPY (EGD) WITH PROPOFOL N/A 04/13/2016   Procedure: ESOPHAGOGASTRODUODENOSCOPY (EGD) WITH PROPOFOL;  Surgeon: Gatha Mayer, MD;  Location: [facility];  Service: Endoscopy;  Laterality: N/A;  . ESOPHAGOGASTRODUODENOSCOPY (EGD) WITH PROPOFOL N/A 01/02/2017   Procedure: ESOPHAGOGASTRODUODENOSCOPY (EGD) WITH PROPOFOL;  Surgeon: Yetta Flock, MD;  Location: [facility] ENDOSCOPY;  Service: Gastroenterology;  Laterality: N/A;  . LEFT ARM IMPLANT X2     RADIATION TX ALSO    Social History:  reports that he quit smoking about 18 years ago. He quit after 10.00 years of use. He has never used smokeless tobacco. He reports that he does not drink alcohol or use drugs.  Family History:  Family History  Problem Relation Age of Onset  . Diabetes Father   . Diabetes Sister   . Hypertension Sister   . Hypertension Brother   . Heart disease Neg Hx      Prior to Admission medications   Medication Sig Start Date End Date Taking? Authorizing Provider  Amino Acids-Protein Hydrolys (FEEDING SUPPLEMENT, PRO-STAT SUGAR FREE 64,) LIQD Take 30 mLs by mouth 2 (two) times daily. Patient taking differently: Take 30 mLs by mouth daily.  04/13/16  Yes Dessa Phi Chahn-Yang, DO  atenolol (TENORMIN) 25 MG tablet Take 1 tablet (25 mg total) by mouth daily. 07/09/16  Yes Elayne Snare, MD  atorvastatin (LIPITOR) 10 MG tablet Take 1 tablet (10 mg total) by mouth at bedtime. 07/09/16  Yes Elayne Snare, MD  docusate sodium (COLACE) 100 MG capsule TAKE ONE CAPSULE BY MOUTH TWICE A DAY FOR CONSTIPATION Patient taking  differently: TAKE ONE CAPSULE BY MOUTH TWICE A DAY 07/04/16  Yes Elayne Snare, MD  enzalutamide Gillermina Phy) 40 MG capsule Take 160 mg by mouth daily. 1700   Yes [provider]  ferrous sulfate 325 (65 FE) MG tablet TAKE 1 TABLET BY MOUTH EVERY DAY FOR SUPPLEMENT 11/27/16  Yes Elayne Snare, MD  furosemide (LASIX) 80 MG tablet Take 80 mg by mouth daily. 12/26/16  Yes [provider]  glipiZIDE (GLUCOTROL XL) 5 MG 24 hr tablet Take 1 tablet (5 mg total) by mouth daily with breakfast. 12/28/16  Yes Elayne Snare, MD  Multiple Vitamin (MULTIVITAMIN WITH MINERALS) TABS tablet Take 1 tablet by mouth daily.   Yes [provider]  pantoprazole (PROTONIX) 40 MG tablet Take 1 tablet (40 mg total) by mouth 2 (two) times daily. 07/09/16  Yes Elayne Snare, MD  pioglitazone (ACTOS) 15 MG tablet Take 1 tablet (15 mg total) by mouth daily. 11/19/16  Yes Elayne Snare, MD  TRADJENTA 5 MG TABS tablet TAKE 1 TABLET (5 MG TOTAL) BY MOUTH DAILY. 01/18/17  Yes Elayne Snare, MD  ACCU-CHEK FASTCLIX LANCETS MISC Use to check blood sugars twice daily E11.65 11/08/16   Elayne Snare, MD  acetaminophen (TYLENOL) 650 MG CR tablet Take 650 mg by mouth every 4 (four) hours as needed for  pain.    [provider]  glucose blood test strip Use as instructed 11/08/16   Elayne Snare, MD  ondansetron (ZOFRAN-ODT) 4 MG disintegrating tablet Take 1 tablet (4 mg total) by mouth every 8 (eight) hours as needed for nausea or vomiting. 01/18/17   Golden Circle, FNP    Physical Exam: Vitals:   01/21/17 2215 01/21/17 2230 01/21/17 2243 01/21/17 2345  BP:   115/72   Pulse: 73 74 80 75  Resp: 18 19 19 17   Temp:      TempSrc:      SpO2: 99% 100% 100% 100%   General: Not in acute distress. dry mucus and membrane. HEENT:       Eyes: PERRL, EOMI, no scleral icterus.       ENT: No discharge from the ears and nose, no pharynx injection, no tonsillar enlargement.        Neck: No JVD, no bruit, no mass felt. Heme: No neck  lymph node enlargement. Cardiac: S1/S2, RRR, No murmurs, No gallops or rubs. Respiratory: No rales, wheezing, rhonchi or rubs. GI: Soft, nondistended, nontender, no rebound pain, no organomegaly, BS present. GU: No hematuria Ext: No pitting leg edema bilaterally. 2+DP/PT pulse bilaterally. Musculoskeletal: No joint deformities, No joint redness or warmth, no limitation of ROM in spin. Skin: No rashes.  Neuro: little drowsy, but oriented X3, cranial nerves II-XII grossly intact, moves all extremities normally.  Psych: Patient is not psychotic, no suicidal or hemocidal ideation.  Labs on Admission: I have personally reviewed following labs and imaging studies  CBC:  Recent Labs Lab 01/18/17 1100 01/21/17 1934  WBC 11.2* 8.6  NEUTROABS 9.4* 7.0  HGB 10.9* 8.3*  HCT 34.7* 25.0*  MCV 96.5 94.0  PLT 263.0 169   Basic Metabolic Panel:  Recent Labs Lab 01/18/17 1100 01/21/17 1949  NA 146* 151*  K 3.4* 3.3*  CL 99 113*  CO2 26 24  GLUCOSE 252* 233*  BUN 74* 102*  CREATININE 2.81* 3.23*  CALCIUM 9.7 7.8*   GFR: CrCl cannot be calculated (Unknown ideal weight.). Liver Function Tests:  Recent Labs Lab 01/18/17 1100 01/21/17 1949  AST 16 16  ALT 8 9*  ALKPHOS 83 82  BILITOT 0.5 0.5  PROT 6.9 5.0*  ALBUMIN 3.5 2.4*   No results for input(s): LIPASE, AMYLASE in the last 168 hours. No results for input(s): AMMONIA in the last 168 hours. Coagulation Profile: No results for input(s): INR, PROTIME in the last 168 hours. Cardiac Enzymes: No results for input(s): CKTOTAL, CKMB, CKMBINDEX, TROPONINI in the last 168 hours. BNP (last 3 results) No results for input(s): PROBNP in the last 8760 hours. HbA1C: No results for input(s): HGBA1C in the last 72 hours. CBG:  Recent Labs Lab 01/21/17 2009 01/22/17 0028  GLUCAP 249* 188*   Lipid Profile: No results for input(s): CHOL, HDL, LDLCALC, TRIG, CHOLHDL, LDLDIRECT in the last 72 hours. Thyroid Function Tests: No  results for input(s): TSH, T4TOTAL, FREET4, T3FREE, THYROIDAB in the last 72 hours. Anemia Panel: No results for input(s): VITAMINB12, FOLATE, FERRITIN, TIBC, IRON, RETICCTPCT in the last 72 hours. Urine analysis:    Component Value Date/Time   COLORURINE YELLOW 03/14/2016 1502   APPEARANCEUR CLEAR 03/14/2016 1502   LABSPEC 1.009 03/14/2016 1502   PHURINE 5.0 03/14/2016 1502   GLUCOSEU NEGATIVE 03/14/2016 1502   GLUCOSEU NEGATIVE 09/22/2014 0757   HGBUR NEGATIVE 03/14/2016 1502   BILIRUBINUR NEGATIVE 03/14/2016 1502   BILIRUBINUR n 04/30/2011 1534   KETONESUR  NEGATIVE 03/14/2016 1502   PROTEINUR NEGATIVE 03/14/2016 1502   UROBILINOGEN 0.2 09/22/2014 0757   NITRITE NEGATIVE 03/14/2016 1502   LEUKOCYTESUR NEGATIVE 03/14/2016 1502   Sepsis Labs: @LABRCNTIP (procalcitonin:4,lacticidven:4) )No results found for this or any previous visit (from the past 240 hour(s)).   Radiological Exams on Admission: No results found.   EKG:  Reviewed independently. Sinus rhythm, QTC 541, low voltage, nonspecific T-wave change.  Assessment/Plan Principal Problem:   Generalized weakness Active Problems:   Type II diabetes mellitus with renal manifestations (HCC)   Hyperlipidemia associated with type 2 diabetes mellitus (HCC)   Essential hypertension   PROSTATE CANCER, HX OF   Hypokalemia   Acute renal failure superimposed on stage 3 chronic kidney disease (HCC)   Protein-calorie malnutrition, severe   Dehydration   Anemia   Achalasia   Hypernatremia   Hypotension   Chronic diastolic CHF (congestive heart failure) (HCC)   Generalized weakness: likely due to multifactorial etiology, including dehydration,electrolytes disturbance, worsening renal function, decreased oral intake, chronic anemia.  -will place on tele bed for obs -treat underlying issues as below -pr/ot  Hypotension: blood pressure was reportedly to be 86/53, which responded to IV fluid quickly. Currently blood pressure is  111/69. Most likely due to dehydration. Patient does not have signs of infection, though cannot completely rule out occult infection. -IVF: 1.5 L NS, then D5-1/2NS at 100 cc/h -f/u UA and Bx -hold lasix and atenolol -check cortisol level  AoCKD-III: Baseline Cre is 2.5, pt's Cre is 3.23 on admission. Likely due to prerenal secondary to dehydration and continuation of diuretics. ATN is also possible given hypotension. - IVF as above - Check  FeUrea - Follow up renal function by BMP - Hold lasix  Hypernatremia: Likely due to dehydration. Patient is little drowsy, but oriented 3. -IVF as above -f/u by BMP  Hypokalemia: K=3.3 on admission. - Repleted - Check Mg level  Type II diabetes mellitus with renal manifestations ([place]): A1c was 7.2 on 11/08/16, fairly controlled. Patient is taking Actos, glipizide to Tradjenta at home -SSI -hold oral medications  HLD: -lipitor  Essential hypertension: -hold Lasix and Tylenol IVF hydralazine when necessary  GERD: -Protonix  PROSTATE CANCER, HX OF:  -continue Xatandi  Protein-calorie malnutrition, severe: -Nutrition consult  And deficiencyAnemia: Hgb 0.9 on 01/18/17--> 8.3 today, slight drop. No active bleeding. -Follow-up CBC -continue iron supplement  Achalasia: s/p of Botox injection. -no acute issues  Chronic diastolic CHF (congestive heart failure) ([place]): 2-D echo on 02/23/16 showed EF of 55-60% with grade 1 diastolic dysfunction. Patient does not have leg edema or JVD. Clinically patient is dehydrated. -Hold Lasix -BNP   DVT ppx: sQ Lovenox Code Status: Full code Family Communication:  Yes, patient's  Wife at bed side Disposition Plan:  Anticipate discharge back to previous home environment Consults called:  none Admission status: Obs / tele    Date of Service 01/22/2017    Ivor Costa [facility] Pager [phone]  If 7PM-7AM, please contact night-coverage [email or web address] Password [email or web address] 01/22/2017, 1:04  AM

## 2017-01-21 NOTE — ED Notes (Signed)
Bed: HC09 Expected date:  Expected time:  Means of arrival:  Comments: 76 yo M/ CA pt weakness

## 2017-01-22 DIAGNOSIS — I952 Hypotension due to drugs: Secondary | ICD-10-CM | POA: Diagnosis present

## 2017-01-22 DIAGNOSIS — K220 Achalasia of cardia: Secondary | ICD-10-CM | POA: Diagnosis not present

## 2017-01-22 DIAGNOSIS — C61 Malignant neoplasm of prostate: Secondary | ICD-10-CM | POA: Diagnosis not present

## 2017-01-22 DIAGNOSIS — M6250 Muscle wasting and atrophy, not elsewhere classified, unspecified site: Secondary | ICD-10-CM | POA: Diagnosis present

## 2017-01-22 DIAGNOSIS — E870 Hyperosmolality and hypernatremia: Secondary | ICD-10-CM | POA: Diagnosis present

## 2017-01-22 DIAGNOSIS — E43 Unspecified severe protein-calorie malnutrition: Secondary | ICD-10-CM | POA: Diagnosis present

## 2017-01-22 DIAGNOSIS — E876 Hypokalemia: Secondary | ICD-10-CM | POA: Diagnosis present

## 2017-01-22 DIAGNOSIS — N184 Chronic kidney disease, stage 4 (severe): Secondary | ICD-10-CM | POA: Diagnosis present

## 2017-01-22 DIAGNOSIS — E1143 Type 2 diabetes mellitus with diabetic autonomic (poly)neuropathy: Secondary | ICD-10-CM | POA: Diagnosis present

## 2017-01-22 DIAGNOSIS — N179 Acute kidney failure, unspecified: Secondary | ICD-10-CM | POA: Diagnosis present

## 2017-01-22 DIAGNOSIS — E860 Dehydration: Secondary | ICD-10-CM | POA: Diagnosis not present

## 2017-01-22 DIAGNOSIS — E785 Hyperlipidemia, unspecified: Secondary | ICD-10-CM | POA: Diagnosis present

## 2017-01-22 DIAGNOSIS — I130 Hypertensive heart and chronic kidney disease with heart failure and stage 1 through stage 4 chronic kidney disease, or unspecified chronic kidney disease: Secondary | ICD-10-CM | POA: Diagnosis present

## 2017-01-22 DIAGNOSIS — C785 Secondary malignant neoplasm of large intestine and rectum: Secondary | ICD-10-CM | POA: Diagnosis not present

## 2017-01-22 DIAGNOSIS — R627 Adult failure to thrive: Secondary | ICD-10-CM | POA: Diagnosis not present

## 2017-01-22 DIAGNOSIS — E1122 Type 2 diabetes mellitus with diabetic chronic kidney disease: Secondary | ICD-10-CM | POA: Diagnosis present

## 2017-01-22 DIAGNOSIS — I472 Ventricular tachycardia: Secondary | ICD-10-CM | POA: Diagnosis not present

## 2017-01-22 DIAGNOSIS — R531 Weakness: Secondary | ICD-10-CM | POA: Diagnosis not present

## 2017-01-22 DIAGNOSIS — E1169 Type 2 diabetes mellitus with other specified complication: Secondary | ICD-10-CM | POA: Diagnosis present

## 2017-01-22 DIAGNOSIS — D631 Anemia in chronic kidney disease: Secondary | ICD-10-CM | POA: Diagnosis present

## 2017-01-22 DIAGNOSIS — Z681 Body mass index (BMI) 19 or less, adult: Secondary | ICD-10-CM | POA: Diagnosis not present

## 2017-01-22 DIAGNOSIS — Z23 Encounter for immunization: Secondary | ICD-10-CM | POA: Diagnosis not present

## 2017-01-22 DIAGNOSIS — I959 Hypotension, unspecified: Secondary | ICD-10-CM | POA: Diagnosis present

## 2017-01-22 DIAGNOSIS — K219 Gastro-esophageal reflux disease without esophagitis: Secondary | ICD-10-CM | POA: Diagnosis present

## 2017-01-22 DIAGNOSIS — K3184 Gastroparesis: Secondary | ICD-10-CM | POA: Diagnosis present

## 2017-01-22 DIAGNOSIS — I5032 Chronic diastolic (congestive) heart failure: Secondary | ICD-10-CM | POA: Diagnosis present

## 2017-01-22 DIAGNOSIS — J984 Other disorders of lung: Secondary | ICD-10-CM | POA: Diagnosis present

## 2017-01-22 LAB — CBC
HCT: 28.5 % — ABNORMAL LOW (ref 39.0–52.0)
HEMOGLOBIN: 9.3 g/dL — AB (ref 13.0–17.0)
MCH: 30.7 pg (ref 26.0–34.0)
MCHC: 32.6 g/dL (ref 30.0–36.0)
MCV: 94.1 fL (ref 78.0–100.0)
Platelets: 237 10*3/uL (ref 150–400)
RBC: 3.03 MIL/uL — AB (ref 4.22–5.81)
RDW: 15.2 % (ref 11.5–15.5)
WBC: 9.5 10*3/uL (ref 4.0–10.5)

## 2017-01-22 LAB — BRAIN NATRIURETIC PEPTIDE: B Natriuretic Peptide: 106.5 pg/mL — ABNORMAL HIGH (ref 0.0–100.0)

## 2017-01-22 LAB — BASIC METABOLIC PANEL
ANION GAP: 21 — AB (ref 5–15)
BUN: 111 mg/dL — ABNORMAL HIGH (ref 6–20)
CO2: 22 mmol/L (ref 22–32)
Calcium: 9 mg/dL (ref 8.9–10.3)
Chloride: 105 mmol/L (ref 101–111)
Creatinine, Ser: 3.58 mg/dL — ABNORMAL HIGH (ref 0.61–1.24)
GFR calc non Af Amer: 15 mL/min — ABNORMAL LOW (ref >60)
GFR, EST AFRICAN AMERICAN: 18 mL/min — AB (ref >60)
GLUCOSE: 192 mg/dL — AB (ref 65–99)
POTASSIUM: 3.4 mmol/L — AB (ref 3.5–5.1)
Sodium: 148 mmol/L — ABNORMAL HIGH (ref 135–145)

## 2017-01-22 LAB — URINALYSIS, ROUTINE W REFLEX MICROSCOPIC
BILIRUBIN URINE: NEGATIVE
Glucose, UA: NEGATIVE mg/dL
HGB URINE DIPSTICK: NEGATIVE
Ketones, ur: 5 mg/dL — AB
LEUKOCYTES UA: NEGATIVE
NITRITE: NEGATIVE
PH: 5 (ref 5.0–8.0)
Protein, ur: NEGATIVE mg/dL
SPECIFIC GRAVITY, URINE: 1.015 (ref 1.005–1.030)

## 2017-01-22 LAB — MAGNESIUM: Magnesium: 2.6 mg/dL — ABNORMAL HIGH (ref 1.7–2.4)

## 2017-01-22 LAB — CBG MONITORING, ED: GLUCOSE-CAPILLARY: 188 mg/dL — AB (ref 65–99)

## 2017-01-22 LAB — GLUCOSE, CAPILLARY
GLUCOSE-CAPILLARY: 155 mg/dL — AB (ref 65–99)
GLUCOSE-CAPILLARY: 179 mg/dL — AB (ref 65–99)
GLUCOSE-CAPILLARY: 156 mg/dL — AB (ref 65–99)
GLUCOSE-CAPILLARY: 77 mg/dL (ref 65–99)

## 2017-01-22 LAB — CREATININE, URINE, RANDOM: CREATININE, URINE: 86.82 mg/dL

## 2017-01-22 LAB — CORTISOL-AM, BLOOD: Cortisol - AM: 33.2 ug/dL — ABNORMAL HIGH (ref 6.7–22.6)

## 2017-01-22 MED ORDER — PANTOPRAZOLE SODIUM 40 MG PO TBEC
40.0000 mg | DELAYED_RELEASE_TABLET | Freq: Two times a day (BID) | ORAL | Status: DC
  Administered 2017-01-22 – 2017-01-26 (×9): 40 mg via ORAL
  Filled 2017-01-22 (×9): qty 1

## 2017-01-22 MED ORDER — HYDROXYZINE HCL 10 MG PO TABS
10.0000 mg | ORAL_TABLET | Freq: Three times a day (TID) | ORAL | Status: DC | PRN
  Filled 2017-01-22: qty 1

## 2017-01-22 MED ORDER — INFLUENZA VAC SPLIT HIGH-DOSE 0.5 ML IM SUSY
0.5000 mL | PREFILLED_SYRINGE | INTRAMUSCULAR | Status: AC
Start: 2017-01-23 — End: 2017-01-23
  Administered 2017-01-23: 0.5 mL via INTRAMUSCULAR
  Filled 2017-01-22: qty 0.5

## 2017-01-22 MED ORDER — ENOXAPARIN SODIUM 30 MG/0.3ML ~~LOC~~ SOLN
30.0000 mg | Freq: Every day | SUBCUTANEOUS | Status: DC
  Administered 2017-01-22 – 2017-01-26 (×5): 30 mg via SUBCUTANEOUS
  Filled 2017-01-22 (×4): qty 0.3

## 2017-01-22 MED ORDER — POTASSIUM CHLORIDE CRYS ER 20 MEQ PO TBCR
40.0000 meq | EXTENDED_RELEASE_TABLET | Freq: Once | ORAL | Status: AC
  Administered 2017-01-22: 40 meq via ORAL
  Filled 2017-01-22: qty 2

## 2017-01-22 MED ORDER — ENSURE ENLIVE PO LIQD
237.0000 mL | Freq: Two times a day (BID) | ORAL | Status: DC
  Administered 2017-01-22 – 2017-01-26 (×7): 237 mL via ORAL

## 2017-01-22 NOTE — Evaluation (Addendum)
Occupational Therapy Evaluation Patient Details Name: [name] MRN: [id number] DOB: 1940-10-04 Today's Date: 01/22/2017    History of Present Illness  [name] is a 76 y.o. male with medical history significant of Hypertension, hyperlipidemia, diabetes mellitus, GERD, prostate cancer, CKD-3, anemia, esophageal achalasia (s/p of Botox injection), GI bleeding, dCHF, who presents with generalized weakness.   Clinical Impression   Pt was admitted for the above.  Pt states he was mod I at home.  He was minimally participatory with OT:  Pt c/o fatique and self limited what he would do.  Will follow in acute setting with supervision to min A level goals.      Follow Up Recommendations  SNF;Supervision/Assistance - 24 hour    Equipment Recommendations   (to be further assessed:  ? 3:1)    Recommendations for Other Services       Precautions / Restrictions Precautions Precautions: Fall Restrictions Weight Bearing Restrictions: No      Mobility Bed Mobility               General bed mobility comments: pt sat up 3/4 way and got back into bed and scooted towards center of bed unassisted  Transfers                      Balance                                           ADL either performed or assessed with clinical judgement   ADL Overall ADL's : Needs assistance/impaired Eating/Feeding: Set up;Bed level   Grooming: Set up;Sitting   Upper Body Bathing: Maximal assistance;Bed level   Lower Body Bathing: Total assistance;Bed level   Upper Body Dressing : Moderate assistance;Bed level   Lower Body Dressing: Total assistance;Sit to/from stand                 General ADL Comments: Pt initially agreeable to getting OOB to chair for a little while.  When I got linen, he reported that he didn't think he could.  Encouraged him to try. He did not want physical assistance.  He sat up 3/4 of the way and said he just couldn't  continue.  Encouraged him and educated him that he needs to sit up to build his endurance.  He was able to scoot back towards center of bed at end of session  Above levels of assistance are based on clinical judgment     Vision         Perception     Praxis      Pertinent Vitals/Pain Pain Assessment: No/denies pain     Hand Dominance     Extremity/Trunk Assessment Upper Extremity Assessment Upper Extremity Assessment: Generalized weakness          Communication Communication Communication: No difficulties   Cognition Arousal/Alertness: Awake/alert Behavior During Therapy: WFL for tasks assessed/performed Overall Cognitive Status: No family/caregiver present to determine baseline cognitive functioning                                     General Comments  pt has not been eating well.  He feels very weak.  He participated minimally with OT this session    Exercises     Shoulder Instructions  Home Living Family/patient expects to be discharged to:: Private residence Living Arrangements: Spouse/significant other Available Help at Discharge: Family;Available PRN/intermittently Type of Home: House                       Home Equipment: Kasandra Knudsen - single point          Prior Functioning/Environment    Gait / Transfers Assistance Needed: per pt he amb in house with cane;  ADL's / Homemaking Assistance Needed: pt reports independence with dressing   Comments: pt states he was mod I at home        OT Problem List: Decreased strength;Decreased activity tolerance;Impaired balance (sitting and/or standing);Decreased knowledge of use of DME or AE      OT Treatment/Interventions: Self-care/ADL training;DME and/or AE instruction;Energy conservation;Patient/family education;Therapeutic activities (balance NT)    OT Goals(Current goals can be found in the care plan section) Acute Rehab OT Goals Patient Stated Goal: none stated OT Goal  Formulation:  (pt did not participate) Time For Goal Achievement: 01/29/17 Potential to Achieve Goals: Fair ADL Goals Pt Will Perform Grooming: with supervision;standing (wash hands) Pt Will Transfer to Toilet: with min guard assist;bedside commode;ambulating Pt Will Perform Toileting - Clothing Manipulation and hygiene: with min guard assist;sitting/lateral leans;sit to/from stand Additional ADL Goal #1: pt will complete UB adls with set up sitting and LB adls with min a, sit to stand  OT Frequency: Min 2X/week   Barriers to D/C:            Co-evaluation              AM-PAC PT "6 Clicks" Daily Activity     Outcome Measure Help from another person eating meals?: A Little Help from another person taking care of personal grooming?: A Little Help from another person toileting, which includes using toliet, bedpan, or urinal?: A Lot Help from another person bathing (including washing, rinsing, drying)?: A Lot Help from another person to put on and taking off regular upper body clothing?: A Lot Help from another person to put on and taking off regular lower body clothing?: A Lot 6 Click Score: 14   End of Session    Activity Tolerance: Patient limited by fatigue Patient left: in bed;with call bell/phone within reach;with bed alarm set  OT Visit Diagnosis: Muscle weakness (generalized) (M62.81)                Time: 4665-9935 OT Time Calculation (min): 13 min Charges:  OT General Charges $OT Visit: 1 Visit OT Evaluation $OT Eval Low Complexity: 1 Low G-Codes: OT G-codes **NOT FOR INPATIENT CLASS** Functional Assessment Tool Used: Clinical judgement Functional Limitation: Self care Self Care Current Status (T0177): At least 80 percent but less than 100 percent impaired, limited or restricted Self Care Goal Status (L3903): At least 20 percent but less than 40 percent impaired, limited or restricted   [name],  OTR/L [id number] 01/22/2017  [name] 01/22/2017, 2:54 PM

## 2017-01-22 NOTE — Progress Notes (Signed)
PROGRESS NOTE    [name]  XHB:[id number] DOB: [birth year] DOA: 01/21/2017 PCP: [facility], FNP    Brief Narrative:  76 y.o. male with medical history significant of Hypertension, hyperlipidemia, diabetes mellitus, GERD, prostate cancer, CKD-3, anemia, esophageal achalasia (s/p of Botox injection), GI bleeding, dCHF, who presents with generalized weakness.   Assessment & Plan:   Principal Problem:   Generalized weakness - most likely due to metabolic abnormalities, hypotension, and dehydration.    Hypernatremia - improving on one half normal saline. We'll plan on continuing  Hypokalemia  - will replace orally and reassess next a.m.    Hypotension - secondary to poor oral intake. Improved with IVF rehydration  Active Problems:   Type II diabetes mellitus with renal manifestations ([place]) - we'll continue sliding scale insulin with night coverage, advance diet to diabetic diet    Hyperlipidemia associated with type 2 diabetes mellitus ([place]) - pt is on statin    PROSTATE CANCER, HX OF - pt to continue routine outpatient monitoring with urologist.     Acute renal failure superimposed on stage 3 chronic kidney disease ([place]) - secondary to prerenal etiology from dehydration. Improving with IV fluids    Protein-calorie malnutrition, severe - will continue ensure    Dehydration - currently on half-normal saline will continue Elevated BUN/creatinine ratio of more than 20-1    Anemia - stable, no active bleeding    Chronic diastolic CHF (congestive heart failure) (H[facility]) - stable appears dehydrated will continue fluid rehydration. Will hold off on blood pressure medications given soft blood pressures   DVT prophylaxis: Lovenox Code Status: Full Family Communication: None at bedside.  Disposition Plan: pending resolution of dehydration   Consultants:   None   Procedures: none   Antimicrobials: none   Subjective: The patient has no new  complaints. No acute issues overnight  Objective: Vitals:   01/22/17 0146 01/22/17 0215 01/22/17 0608 01/22/17 1451  BP: 99/85 (!) 120/53 122/70   Pulse: 75 75 73 74  Resp: 19 18 20 20   Temp:  98.2 F (36.8 C) 97.6 F (36.4 C) 98.3 F (36.8 C)  TempSrc:  Oral Oral Oral  SpO2: 99% 99% 100% 100%  Weight:  53.5 kg (117 lb 15.1 oz) 53.6 kg (118 lb 2.7 oz)   Height:  5\' 8"  (1.727 m)      Intake/Output Summary (Last 24 hours) at 01/22/17 1637 Last data filed at 01/22/17 0900  Gross per 24 hour  Intake             2120 ml  Output              100 ml  Net             2020 ml   Filed Weights   01/22/17 0215 01/22/17 0608  Weight: 53.5 kg (117 lb 15.1 oz) 53.6 kg (118 lb 2.7 oz)    Examination:  General exam: Appears calm and comfortable , dry mucous membranes, no acute distress Respiratory system: Clear to auscultation. Respiratory effort normal.o wheezes Cardiovascular system: S1 & S2 heard, RRR. No JVD, murmurs, rubs, gallops  Gastrointestinal system: Abdomen is nondistended, soft and nontender. No organomegaly or masses felt. Normal bowel sounds heard. Central nervous system: Alert , answers questions appropriately Extremities: warm positive pulses Skin: No rashes, lesions or ulcers on limited exam Psychiatry:  Mood & affect appropriate.     Data Reviewed: I have personally reviewed following labs and imaging studies  CBC:  Recent  Labs Lab 01/18/17 1100 01/21/17 1934 01/22/17 0236  WBC 11.2* 8.6 9.5  NEUTROABS 9.4* 7.0  --   HGB 10.9* 8.3* 9.3*  HCT 34.7* 25.0* 28.5*  MCV 96.5 94.0 94.1  PLT 263.0 211 660   Basic Metabolic Panel:  Recent Labs Lab 01/18/17 1100 01/21/17 1949 01/22/17 0236  NA 146* 151* 148*  K 3.4* 3.3* 3.4*  CL 99 113* 105  CO2 26 24 22   GLUCOSE 252* 233* 192*  BUN 74* 102* 111*  CREATININE 2.81* 3.23* 3.58*  CALCIUM 9.7 7.8* 9.0  MG  --   --  2.6*   GFR: Estimated Creatinine Clearance: 13.5 mL/min (A) (by C-G formula based on  SCr of 3.58 mg/dL (H)). Liver Function Tests:  Recent Labs Lab 01/18/17 1100 01/21/17 1949  AST 16 16  ALT 8 9*  ALKPHOS 83 82  BILITOT 0.5 0.5  PROT 6.9 5.0*  ALBUMIN 3.5 2.4*   No results for input(s): LIPASE, AMYLASE in the last 168 hours. No results for input(s): AMMONIA in the last 168 hours. Coagulation Profile: No results for input(s): INR, PROTIME in the last 168 hours. Cardiac Enzymes: No results for input(s): CKTOTAL, CKMB, CKMBINDEX, TROPONINI in the last 168 hours. BNP (last 3 results) No results for input(s): PROBNP in the last 8760 hours. HbA1C: No results for input(s): HGBA1C in the last 72 hours. CBG:  Recent Labs Lab 01/21/17 2009 01/22/17 0028 01/22/17 0748 01/22/17 1143  GLUCAP 249* 188* 155* 179*   Lipid Profile: No results for input(s): CHOL, HDL, LDLCALC, TRIG, CHOLHDL, LDLDIRECT in the last 72 hours. Thyroid Function Tests: No results for input(s): TSH, T4TOTAL, FREET4, T3FREE, THYROIDAB in the last 72 hours. Anemia Panel: No results for input(s): VITAMINB12, FOLATE, FERRITIN, TIBC, IRON, RETICCTPCT in the last 72 hours. Sepsis Labs: No results for input(s): PROCALCITON, LATICACIDVEN in the last 168 hours.  No results found for this or any previous visit (from the past 240 hour(s)).       Radiology Studies: No results found.      Scheduled Meds: . atorvastatin  10 mg Oral QHS  . enoxaparin (LOVENOX) injection  30 mg Subcutaneous Daily  . enzalutamide  160 mg Oral QAC supper  . feeding supplement (ENSURE ENLIVE)  237 mL Oral BID BM  . feeding supplement (PRO-STAT SUGAR FREE 64)  30 mL Oral BID  . ferrous sulfate  325 mg Oral Q breakfast  . [START ON 01/23/2017] Influenza vac split quadrivalent PF  0.5 mL Intramuscular Tomorrow-1000  . insulin aspart  0-5 Units Subcutaneous QHS  . insulin aspart  0-9 Units Subcutaneous TID WC  . multivitamin with minerals  1 tablet Oral Daily  . pantoprazole  40 mg Oral BID   Continuous  Infusions: . sodium chloride 75 mL/hr at 01/22/17 0200  . sodium chloride Stopped (01/22/17 0215)     LOS: 0 days    Time spent: > 35 minutes  Velvet Bathe, MD [facility] Pager [phone]  If 7PM-7AM, please contact night-coverage [email or web address] Password [facility] 01/22/2017, 4:37 PM

## 2017-01-22 NOTE — ED Notes (Signed)
Pt aware we need a urine sample. Urinal at the bedside.

## 2017-01-22 NOTE — Progress Notes (Signed)
Rx Brief note: Lovenox  Wt=52 kg, CrCl~13 ml/min Rx adjusted Lovenox to 30 mg daily in pt with CrCl<30 ml/min  Thanks Dorrene German 01/22/2017 4:03 AM

## 2017-01-22 NOTE — Progress Notes (Addendum)
Initial Nutrition Assessment  DOCUMENTATION CODES:   Underweight, Severe malnutrition in context of chronic illness  INTERVENTION:    Ensure Enlive po BID, each supplement provides 350 kcal and 20 grams of protein  30 ml Prostat BID, each supplement provides 100 kcals and 15 grams protein.   NUTRITION DIAGNOSIS:   Malnutrition (Severe) related to chronic illness (prostate cancer) as evidenced by 12.5% weight loss in 6 months, severe depletion of body fat, severe depletion of muscle mass.  GOAL:   Patient will meet greater than or equal to 90% of their needs  MONITOR:   PO intake, Supplement acceptance, Weight trends, Labs, Diet advancement  REASON FOR ASSESSMENT:   Consult Assessment of nutrition requirement/status  ASSESSMENT:   Pt with PMH significant for HTN, HLD, DM, prostate cancer (undergoing chemotherapy), CKD III, esophageal achalasia s/p Botox injection, and diastolic CHF. Pt found to be hypotensive at home. Spouse reports pt has declined over the last two weeks. Presents this admission with generalized weakness.  Pt lethargic/confused upon assessment. Reports having a loss in appetite for the last four days. States he typically eats 3 meals per day but recently his intake dropped to small bites. Pt does not consume supplementation at home.   Pt has past history of esophageal achalasia s/p endoscopy several weeks ago. MD notes pt's appetite has decreased ever since the study. Denies difficulty swallowing or choking while eating this hospital stay.    Records indicate pt has lost 12.5% of body weight in 6 months. This percentage in this time frame is significant.   Nutrition-Focused physical exam completed. Findings are severe fat depletion, severe muscle depletion, and no edema.   Medications reviewed and include: ferrous sulfate, SSI, MVI with minerals, NaCl 0.45% @ 75 ml/hr Labs reviewed: Na 148 (H) K 3.4 (L) CBG 192-252 BUN 111 (H) Creatinine 3.58 (H) Mg 2.6 (H)    Diet Order:  Diet full liquid Room service appropriate? Yes; Fluid consistency: Thin  Skin:  Reviewed, no issues  Last BM:  PTA  Height:   Ht Readings from Last 1 Encounters:  01/22/17 5\' 8"  (1.727 m)    Weight:   Wt Readings from Last 1 Encounters:  01/22/17 118 lb 2.7 oz (53.6 kg)    Ideal Body Weight:  70 kg  BMI:  Body mass index is 17.97 kg/m.  Estimated Nutritional Needs:   Kcal:  1876-2036 kcal (35-38 kcal/kg)  Protein:  95-105 grams (1.8-2 g/kg)  Fluid:  >1.8 L/day  EDUCATION NEEDS:   No education needs identified at this time  [place], LDN Clinical Nutrition Pager # - [phone]

## 2017-01-22 NOTE — Evaluation (Signed)
Physical Therapy Evaluation Patient Details Name: [name] MRN: [id number] DOB: Jan 28, 1941 Today's Date: 01/22/2017   History of Present Illness   [name] is a 76 y.o. male with medical history significant of Hypertension, hyperlipidemia, diabetes mellitus, GERD, prostate cancer, CKD-3, anemia, esophageal achalasia (s/p of Botox injection), GI bleeding, dCHF, who presents with generalized weakness.  Clinical Impression  Pt admitted with above diagnosis. Pt currently with functional limitations due to the deficits listed below (see PT Problem List).  Pt will benefit from skilled PT to increase their independence and safety with mobility to allow discharge to the venue listed below.   Pt minimally cooperative with PT; He agrees to sit EOB only and this only after much persuasion; It appears pt is weaker than baseline and will benefit form HHPT; will follow in acute setting    Follow Up Recommendations Home health PT;Supervision for mobility/OOB    Equipment Recommendations  None recommended by PT    Recommendations for Other Services       Precautions / Restrictions Precautions Precautions: Fall Restrictions Weight Bearing Restrictions: No      Mobility  Bed Mobility Overal bed mobility: Needs Assistance Bed Mobility: Supine to Sit;Sit to Supine     Supine to sit: Supervision;Min guard Sit to supine: Supervision;Min guard   General bed mobility comments: for safety; max encouragement to sit EOB  Transfers                 General transfer comment: pt refuses  Ambulation/Gait             General Gait Details: pt refuses  Stairs            Wheelchair Mobility    Modified Rankin (Stroke Patients Only)       Balance Overall balance assessment: Needs assistance;History of Falls   Sitting balance-Leahy Scale: Fair         Standing balance comment: unable to test, pt refuses; pt reports one fall that he can recall, does not know  how long ago                             Pertinent Vitals/Pain Pain Assessment: No/denies pain    Home Living Family/patient expects to be discharged to:: Private residence Living Arrangements: Spouse/significant other Available Help at Discharge: Family;Available PRN/intermittently Type of Home: House Home Access: Stairs to enter Entrance Stairs-Rails: None Entrance Stairs-Number of Steps: 2 Home Layout: One level Home Equipment: Cane - single point Additional Comments: pt reports his wife prepares meals in individual servings and he heats them up in microwave;     Prior Function Level of Independence: Needs assistance   Gait / Transfers Assistance Needed: per pt he amb in house with cane;   ADL's / Homemaking Assistance Needed: pt reports independence with dressing        Hand Dominance        Extremity/Trunk Assessment   Upper Extremity Assessment Upper Extremity Assessment: Generalized weakness    Lower Extremity Assessment Lower Extremity Assessment: Generalized weakness       Communication   Communication:  (difficult to understand at times, speech unclear intermittently)  Cognition Arousal/Alertness: Awake/alert Behavior During Therapy: WFL for tasks assessed/performed Overall Cognitive Status: No family/caregiver present to determine baseline cognitive functioning  General Comments      Exercises     Assessment/Plan    PT Assessment Patient needs continued PT services  PT Problem List Decreased strength;Decreased activity tolerance;Decreased balance;Decreased mobility       PT Treatment Interventions DME instruction;Gait training;Functional mobility training;Therapeutic activities;Therapeutic exercise;Patient/family education    PT Goals (Current goals can be found in the Care Plan section)  Acute Rehab PT Goals Patient Stated Goal: none stated PT Goal Formulation: With  patient Time For Goal Achievement: 01/29/17 Potential to Achieve Goals: Good    Frequency Min 3X/week   Barriers to discharge        Co-evaluation               AM-PAC PT "6 Clicks" Daily Activity  Outcome Measure Difficulty turning over in bed (including adjusting bedclothes, sheets and blankets)?: A Little Difficulty moving from lying on back to sitting on the side of the bed? : A Little Difficulty sitting down on and standing up from a chair with arms (e.g., wheelchair, bedside commode, etc,.)?: A Little Help needed moving to and from a bed to chair (including a wheelchair)?: A Little Help needed walking in hospital room?: A Lot Help needed climbing 3-5 steps with a railing? : A Lot 6 Click Score: 16    End of Session   Activity Tolerance: Other (comment) (self limiting--minimally cooperative) Patient left: in bed;with call bell/phone within reach;with bed alarm set   PT Visit Diagnosis: Muscle weakness (generalized) (M62.81)    Time: 7893-8101 PT Time Calculation (min) (ACUTE ONLY): 12 min   Charges:   PT Evaluation $PT Eval Low Complexity: 1 Low     PT G Codes:   PT G-Codes **NOT FOR INPATIENT CLASS** Functional Assessment Tool Used: AM-PAC 6 Clicks Basic Mobility;Clinical judgement Functional Limitation: Changing and maintaining body position Changing and Maintaining Body Position Current Status (B5102): At least 20 percent but less than 40 percent impaired, limited or restricted Changing and Maintaining Body Position Goal Status (H8527): At least 1 percent but less than 20 percent impaired, limited or restricted      [facility] 01/22/2017, 10:49 AM

## 2017-01-22 NOTE — Care Management Note (Signed)
Case Management Note  Patient Details  Name: [name] MRN: [id number] Date of Birth: 26-May-1940  Subjective/Objective:                  hypotension  Action/Plan: Date:  October 16,2 018 Chart reviewed for concurrent status and case management needs.  Will continue to follow patient progress.  Discharge Planning: following for needs  Expected discharge date: [id number]  Velva Harman, BSN, [employer], [place]  Expected Discharge Date:  01/24/17               Expected Discharge Plan:  Home/Self Care  In-House Referral:     Discharge planning Services  CM Consult  Post Acute Care Choice:    Choice offered to:     DME Arranged:    DME Agency:     HH Arranged:    HH Agency:     Status of Service:  In process, will continue to follow  If discussed at Long Length of Stay Meetings, dates discussed:    Additional Comments:  Leeroy Cha, RN 01/22/2017, 9:21 AM

## 2017-01-23 ENCOUNTER — Inpatient Hospital Stay (HOSPITAL_COMMUNITY): Payer: Medicare Other

## 2017-01-23 DIAGNOSIS — R531 Weakness: Secondary | ICD-10-CM

## 2017-01-23 DIAGNOSIS — C785 Secondary malignant neoplasm of large intestine and rectum: Secondary | ICD-10-CM

## 2017-01-23 DIAGNOSIS — K220 Achalasia of cardia: Secondary | ICD-10-CM

## 2017-01-23 DIAGNOSIS — C61 Malignant neoplasm of prostate: Secondary | ICD-10-CM

## 2017-01-23 DIAGNOSIS — R627 Adult failure to thrive: Secondary | ICD-10-CM

## 2017-01-23 DIAGNOSIS — E860 Dehydration: Secondary | ICD-10-CM

## 2017-01-23 LAB — GLUCOSE, CAPILLARY
GLUCOSE-CAPILLARY: 172 mg/dL — AB (ref 65–99)
Glucose-Capillary: 66 mg/dL (ref 65–99)
Glucose-Capillary: 138 mg/dL — ABNORMAL HIGH (ref 65–99)
Glucose-Capillary: 109 mg/dL — ABNORMAL HIGH (ref 65–99)

## 2017-01-23 LAB — BASIC METABOLIC PANEL
ANION GAP: 10 (ref 5–15)
BUN: 100 mg/dL — ABNORMAL HIGH (ref 6–20)
CALCIUM: 8.9 mg/dL (ref 8.9–10.3)
CHLORIDE: 107 mmol/L (ref 101–111)
CO2: 26 mmol/L (ref 22–32)
Creatinine, Ser: 3.26 mg/dL — ABNORMAL HIGH (ref 0.61–1.24)
GFR calc non Af Amer: 17 mL/min — ABNORMAL LOW (ref >60)
GFR, EST AFRICAN AMERICAN: 20 mL/min — AB (ref >60)
Glucose, Bld: 65 mg/dL (ref 65–99)
POTASSIUM: 3.9 mmol/L (ref 3.5–5.1)
Sodium: 143 mmol/L (ref 135–145)

## 2017-01-23 LAB — UREA NITROGEN, URINE: UREA NITROGEN UR: 756 mg/dL

## 2017-01-23 MED ORDER — IOPAMIDOL (ISOVUE-300) INJECTION 61%: 15.0000 mL | Freq: Two times a day (BID) | INTRAVENOUS | Status: DC | PRN

## 2017-01-23 MED ORDER — INSULIN ASPART 100 UNIT/ML ~~LOC~~ SOLN
0.0000 [IU] | Freq: Three times a day (TID) | SUBCUTANEOUS | Status: DC
  Administered 2017-01-23: 1 [IU] via SUBCUTANEOUS
  Administered 2017-01-24 – 2017-01-25 (×2): 2 [IU] via SUBCUTANEOUS
  Administered 2017-01-25: 1 [IU] via SUBCUTANEOUS
  Administered 2017-01-26: 2 [IU] via SUBCUTANEOUS

## 2017-01-23 MED ORDER — IOPAMIDOL (ISOVUE-300) INJECTION 61%
INTRAVENOUS | Status: AC
  Filled 2017-01-23: qty 30

## 2017-01-23 NOTE — Consult Note (Signed)
Reason for Referral: Prostate cancer.  HPI: 76 year old gentleman with castration resistant metastatic prostate cancer. He was initially diagnosed in 2006 with Gleason score 4+4 = 8. He was treated with cryotherapy and a PSA nadir was around 0.4 in June 2017.   His PSA started to rise and a repeat biopsy in 2008 showed no active cancer detected. His staging workup did not reveal any evidence of metastatic disease at that time including CT scan and bone scan. His PSA continues to rise and was a 7.75 in June 2009. He was treated with androgen deprivation under the care of Dr. Gaynelle Arabian initially with Degarelixand subsequently with Vantas.  His PSA in 2011 was as well as 0.18. His PSA started to rise again in 2016 up to 19.57 in April 2016. In April 2017 was 31.55 and on 10/20/2015 was 43.6. Staging workup including CT scan and a bone scan did not reveal any clear-cut metastatic disease.   Imaging obtained on 11/17/2015 utilizing F-18 Fluciclovine PET was interpreted by Dr. Leonia Reeves from radiology and showed 2 soft tissue masses in the deep pelvis adjacent to the rectum with intense amino acid radiotracer uptake consistent with prostate cancer recurrence. These masses measuring 2.8 x 2.5 cm.   I saw him in consultation in 2017 and was started on Zytiga in September 2017 and took it briefly. He tolerated it poorly and therapy discontinued. He has been followed with Dr. Gaynelle Arabian at [facility] urology for his prostate cancer care. He was prescribed Xtandi in July or August of this year. He has tolerated this medication with few side effects including fatigue and tiredness.  He was hospitalized on multiple occasions the last year for multiple reasons including GI bleed and weakness and failure to thrive. He is known to have esophageal achalasia and gastroparesis.he was hospitalized again on 01/21/2017 after presenting with generalized weakness related due to dehydration and poor by mouth  intake.  Clinically, he reports feeling better after IV hydration although still unable to tolerate solid food. He is able to take liquids diet. He denies any chest pain or back pain. He denied any pathological fractures or bone pain. He denied any headaches or blurry vision. He denied any syncope or seizures. He does not report any fevers or chills or sweats. He does not report any cough, wheezing or hemoptysis. He does not report any nausea, vomiting or abdominal pain. He does not report any frequency urgency or hematuria. Remaining review of systems unremarkable.     Past Medical History:  Diagnosis Date  . Anemia    IRON TRANSFUSION 1 MONTH AGO  . Blood clot in vein 2008   LEFT LEG  . Cataract    LEFT  . CKD (chronic kidney disease) 02/2016  . Diabetes mellitus, type 2 ([place]) 2008  . GERD (gastroesophageal reflux disease)   . Hyperlipidemia 2008  . Hypertension 2008  . Pneumonia 01/18/2016  . Prostate cancer ([place]) 2006   IMPLANT IN LEFT ARM FOR TX  :  Past Surgical History:  Procedure Laterality Date  . BALLOON DILATION N/A 04/13/2016   Procedure: BALLOON DILATION;  Surgeon: Gatha Mayer, MD;  Location: [facility] ENDOSCOPY;  Service: Endoscopy;  Laterality: N/A;  . BOTOX INJECTION N/A 04/13/2016   Procedure: BOTOX INJECTION;  Surgeon: Gatha Mayer, MD;  Location: [place];  Service: Endoscopy;  Laterality: N/A;  . BOTOX INJECTION N/A 01/02/2017   Procedure: BOTOX INJECTION;  Surgeon: Yetta Flock, MD;  Location: [facility] ENDOSCOPY;  Service: Gastroenterology;  Laterality: N/A;  .  COLONOSCOPY W/ POLYPECTOMY  03/2008   diminutive rectal polyp (path: prolapse type polyp, not adenoma).  moderate pan-diverticulitis.   [facility] Kitchen ESOPHAGEAL MANOMETRY  1984   unable to pass manometry catheter beyond LES. Aperiystalsis of esophagus: sugg of achalasia.   Marland Kitchen ESOPHAGOGASTRODUODENOSCOPY N/A 02/17/2016   Procedure: ESOPHAGOGASTRODUODENOSCOPY (EGD);  Surgeon: [name], MD;  Location: [facility]  ENDOS[facility];  Service: Endoscopy;  Laterality: N/A;  . ESOPHAGOGASTRODUODENOSCOPY (EGD) WITH PROPOFOL N/A 02/21/2016   Procedure: ESOPHAGOGASTRODUODENOSCOPY (EGD) WITH PROPOFOL;  Surgeon: Jerene Bears, MD;  Location: [facility] ENDOSCOPY;  Service: Gastroenterology;  Laterality: N/A;  . ESOPHAGOGASTRODUODENOSCOPY (EGD) WITH PROPOFOL N/A 02/28/2016   Procedure: ESOPHAGOGASTRODUODENOSCOPY (EGD) WITH PROPOFOL withBOTOX;  Surgeon: Manus Gunning, MD;  Location: [facility] ENDOSCOPY;  Service: Gastroenterology;  Laterality: N/A;  . ESOPHAGOGASTRODUODENOSCOPY (EGD) WITH PROPOFOL N/A 04/13/2016   Procedure: ESOPHAGOGASTRODUODENOSCOPY (EGD) WITH PROPOFOL;  Surgeon: Gatha Mayer, MD;  Location: [place];  Service: Endoscopy;  Laterality: N/A;  . ESOPHAGOGASTRODUODENOSCOPY (EGD) WITH PROPOFOL N/A 01/02/2017   Procedure: ESOPHAGOGASTRODUODENOSCOPY (EGD) WITH PROPOFOL;  Surgeon: Yetta Flock, MD;  Location: [facility] ENDOSCOPY;  Service: Gastroenterology;  Laterality: N/A;  . LEFT ARM IMPLANT X2     RADIATION TX ALSO  :   Current Facility-Administered Medications:  .  0.45 % sodium chloride infusion, , Intravenous, Continuous, Ivor Costa, MD, Last Rate: 75 mL/hr at 01/23/17 0821 .  acetaminophen (TYLENOL) tablet 650 mg, 650 mg, Oral, Q4H PRN, Ivor Costa, MD .  atorvastatin (LIPITOR) tablet 10 mg, 10 mg, Oral, QHS, Ivor Costa, MD, 10 mg at 01/22/17 2206 .  enoxaparin (LOVENOX) injection 30 mg, 30 mg, Subcutaneous, Daily, Ivor Costa, MD, 30 mg at 01/23/17 1050 .  enzalutamide (XTANDI) capsule 160 mg, 160 mg, Oral, QAC supper, Ivor Costa, MD, 160 mg at 01/22/17 1737 .  feeding supplement (ENSURE ENLIVE) (ENSURE ENLIVE) liquid 237 mL, 237 mL, Oral, BID BM, Ivor Costa, MD, 237 mL at 01/23/17 1604 .  feeding supplement (PRO-STAT SUGAR FREE 64) liquid 30 mL, 30 mL, Oral, BID, Ivor Costa, MD, 30 mL at 01/23/17 1050 .  ferrous sulfate tablet 325 mg, 325 mg, Oral, Q breakfast, Ivor Costa, MD, 325 mg at 01/23/17 2951 .   hydrALAZINE (APRESOLINE) injection 5 mg, 5 mg, Intravenous, Q2H PRN, Ivor Costa, MD .  hydrOXYzine (ATARAX/VISTARIL) tablet 10 mg, 10 mg, Oral, TID PRN, Ivor Costa, MD .  insulin aspart (novoLOG) injection 0-9 Units, 0-9 Units, Subcutaneous, TID WC, Florencia Reasons, MD .  multivitamin with minerals tablet 1 tablet, 1 tablet, Oral, Daily, Ivor Costa, MD, 1 tablet at 01/23/17 1049 .  pantoprazole (PROTONIX) EC tablet 40 mg, 40 mg, Oral, BID, Ivor Costa, MD, 40 mg at 01/23/17 1050 .  sodium chloride 0.9 % bolus 500 mL, 500 mL, Intravenous, Once, Ivor Costa, MD, Stopped at 01/22/17 0215 .  zolpidem (AMBIEN) tablet 5 mg, 5 mg, Oral, QHS PRN, Ivor Costa, MD:  No Known Allergies:  Family History  Problem Relation Age of Onset  . Diabetes Father   . Diabetes Sister   . Hypertension Sister   . Hypertension Brother   . Heart disease Neg Hx   :  Social History   Social History  . Marital status: Married    Spouse name: N/A  . Number of children: 2  . Years of education: 15   Occupational History  . retired    Social History Main Topics  . Smoking status: Former Smoker    Years: 10.00    Quit date: 2000  .  Smokeless tobacco: Never Used  . Alcohol use No  . Drug use: No  . Sexual activity: Not on file   Other Topics Concern  . Not on file   Social History Narrative   Fun/Hobby: Football  :  Pertinent items are noted in HPI.  Exam: Blood pressure (!) 103/57, pulse 70, temperature 97.8 F (36.6 C), temperature source Oral, resp. rate (!) 22, height 5\' 8"  (1.727 m), weight 121 lb 0.5 oz (54.9 kg), SpO2 100 %.  ECOG 2 General appearance: alert and ative appeared without distress. Throat: no oral thrush or ulcers Neck: no adenopathy Back: negative Resp: clear to auscultation bilaterally Chest wall: no tenderness Cardio: regular rate and rhythm, S1, S2 normal, no murmur, click, rub or gallop GI: soft, non-tender; bowel sounds normal; no masses,  no organomegaly Pulses: 2+ and  symmetric Skin: Skin color, texture, turgor normal. No rashes or lesions Lymph nodes: Cervical, supraclavicular, and axillary nodes normal.   Recent Labs  01/21/17 1934 01/22/17 0236  WBC 8.6 9.5  HGB 8.3* 9.3*  HCT 25.0* 28.5*  PLT 211 237    Recent Labs  01/22/17 0236 01/23/17 0043  NA 148* 143  K 3.4* 3.9  CL 105 107  CO2 22 26  GLUCOSE 192* 65  BUN 111* 100*  CREATININE 3.58* 3.26*  CALCIUM 9.0 8.9      Dg Chest 2 View  Result Date: 01/18/2017 CLINICAL DATA:  Cough, weakness, nausea for 3 day EXAM: CHEST  2 VIEW COMPARISON:  02/22/2016 FINDINGS: Normal heart size. Clear lungs. No pneumothorax or pleural effusion. No acute bony deformity. IMPRESSION: No active cardiopulmonary disease. Electronically Signed   By: Marybelle Killings M.D.   On: 01/18/2017 10:59    Assessment and Plan:   76 year old gentleman with the following issues:  1. Castration resistant metastatic prostate cancer: He has metastatic disease has been limited based on a PET scan obtained in August 2018. He has been unable to tolerate [place] and currently on Xtandi. He has been getting his prostate cancer care under the care of Dr. Gaynelle Arabian at [facility]. He is a prostate cancer status has not been updated including a PSA and imaging studies to evaluate fully the extent of his cancer at the current stage.  I recommend repeating his staging workup including PSA and imaging studies. This does not need to be done in the inpatient setting.  2. Poor by mouth intake, progressive weakness and failure to thrive: he does have history of esophageal achalasia and gastroparesis. I do not think his symptoms are related to Xtandi at this time. He had similar symptoms on few occasions prior to starting this medication. I have no objections in holding this medication in any case.  Metastatic prostate cancer into the abdominal lymph nodes or thoracic lymph nodes causing his GI symptoms is possible but considered  less likely. To evaluate fully, CT scan chest, abdomen and pelvis would be helpful as mentioned above.   Please call with any questions regarding this pleasant gentleman.

## 2017-01-23 NOTE — Progress Notes (Signed)
PROGRESS NOTE  [name] XTG:[id number] DOB: 28-Jan-1941 DOA: 01/21/2017 PCP: [facility], FNP  HPI/Recap of past 24 hours:  Vomited twice today, denies pain He reports has his eyes closed to rest, reports has been feeling tired. No fever Wife at bedside  Assessment/Plan: Principal Problem:   Generalized weakness Active Problems:   Type II diabetes mellitus with renal manifestations (HCC)   Hyperlipidemia associated with type 2 diabetes mellitus (HCC)   Essential hypertension   PROSTATE CANCER, HX OF   Hypokalemia   Acute renal failure superimposed on stage 3 chronic kidney disease (HCC)   Protein-calorie malnutrition, severe   Dehydration   Anemia   Achalasia   Hypernatremia   Hypotension   Chronic diastolic CHF (congestive heart failure) (HCC)   Anorexia/weight loss/dehydration/weakness/hypotensnion/FTT: -constellation of symptom likely from dehydration from esophageal achalasia, -with h/o esophageal achalasia s/p botox infection a few weeks ago, I have requested GI consult -patient also has a h/o metastatic prostate cancer, he was on zytiga and prednisione, but It seems has discontinued due to fatigue and anorexia, he is recently started on xtandi, he has been off steroids for several months. his am cortisol level is 33. -I have consulted oncology Dr Alen Blew to evaluate patient. Not sure cancer progression or meds side effect could contribute to above  symptoms , I have ordered psa. Will order ct chest /ab/pel ( no contrast due to renal impairment) -there is also significant elevated bun, bun seems has progressively worsened over the last year,  From normal bun in march 2018 to now above 100, this could cause anorexia n/v as well. -will check tsh as well   Hypernatremia: likely from dehydration -Sodium 151 on presentation -Normalized with hydration -continue gentle hydration due to persistent poor oral intatke  Hypokalemia: replaced, check mag  NSVT; 4  runs on tele, does not appear symptomatic Betablocker held due to hypotension Keep k>4, mag>2  AKI on CKD IV: -uremia? -ua no blood, no protein, no wbc, no infection -seems improved some with hydration, -ct ab/pel without contrast ordered -may need nephrology consult if no significant improvement in bun/cr with continued hydration.  Home meds lasix held since admission -renal dosing meds, avoid nephrotoxin. Close monitor urine output  noninsulin dependent dm2,  a1c 7.2 Am blood sugar 65, hold all home oral meds On ssi and hypoglycemia protocol.  H/o HLD, last ldl 63, continue home meds lipitor at 10mg  daily  HTN; presented with hypotension Home meds atenolol and lasix held, on hydration Consider switch to lopressor if patient need to start back on bp meds due to no need of renal dosing compare to atenolol.  Chronic diastolic chf: currently dehydrated, lasix held, on hydrtion  Chronic normocytic anemia: no sign of bleeding. Likely component of anemia of chronic disease. He is also on chronic iron supplement, which is continued.  Severe malnutrition with recent weight loss , fat depletion and muscle wasting on exam. Nutrition consulted.   Code Status: full  Family Communication: patient and wife  Disposition Plan: pending, may need SNF   Consultants:  [facility]  Oncology Dr Alen Blew  Procedures:  none  Antibiotics:  none   Objective: BP (!) 106/53 (BP Location: Right Arm)   Pulse 74   Temp 98.3 F (36.8 C) (Oral)   Resp (!) 22   Ht 5\' 8"  (1.727 m)   Wt 54.9 kg (121 lb 0.5 oz)   SpO2 100%   BMI 18.40 kg/m   Intake/Output Summary (Last 24 hours) at  01/23/17 0739 Last data filed at 01/23/17 6144  Gross per 24 hour  Intake           2492.5 ml  Output              100 ml  Net           2392.5 ml   Filed Weights   01/22/17 0215 01/22/17 0608 01/23/17 0424  Weight: 53.5 kg (117 lb 15.1 oz) 53.6 kg (118 lb 2.7 oz) 54.9 kg (121 lb 0.5 oz)    Exam: Patient  is examined daily including today on 01/23/2017, exams remain the same as of yesterday except that has changed    General:  Weak, lethargy, oriented x3, he does not open eyes during conversation (report wants to rest)  Cardiovascular: RRR  Respiratory: CTABL  Abdomen: Soft/ND/NT, positive BS  Musculoskeletal: No Edema  Neuro: lethargic, oriented   Data Reviewed: Basic Metabolic Panel:  Recent Labs Lab 01/18/17 1100 01/21/17 1949 01/22/17 0236 01/23/17 0043  NA 146* 151* 148* 143  K 3.4* 3.3* 3.4* 3.9  CL 99 113* 105 107  CO2 26 24 22 26   GLUCOSE 252* 233* 192* 65  BUN 74* 102* 111* 100*  CREATININE 2.81* 3.23* 3.58* 3.26*  CALCIUM 9.7 7.8* 9.0 8.9  MG  --   --  2.6*  --    Liver Function Tests:  Recent Labs Lab 01/18/17 1100 01/21/17 1949  AST 16 16  ALT 8 9*  ALKPHOS 83 82  BILITOT 0.5 0.5  PROT 6.9 5.0*  ALBUMIN 3.5 2.4*   No results for input(s): LIPASE, AMYLASE in the last 168 hours. No results for input(s): AMMONIA in the last 168 hours. CBC:  Recent Labs Lab 01/18/17 1100 01/21/17 1934 01/22/17 0236  WBC 11.2* 8.6 9.5  NEUTROABS 9.4* 7.0  --   HGB 10.9* 8.3* 9.3*  HCT 34.7* 25.0* 28.5*  MCV 96.5 94.0 94.1  PLT 263.0 211 237   Cardiac Enzymes:   No results for input(s): CKTOTAL, CKMB, CKMBINDEX, TROPONINI in the last 168 hours. BNP (last 3 results)  Recent Labs  01/21/17 2348  BNP 106.5*    ProBNP (last 3 results) No results for input(s): PROBNP in the last 8760 hours.  CBG:  Recent Labs Lab 01/22/17 0748 01/22/17 1143 01/22/17 1644 01/22/17 2122 01/23/17 0733  GLUCAP 155* 179* 156* 77 66    No results found for this or any previous visit (from the past 240 hour(s)).   Studies: No results found.  Scheduled Meds: . atorvastatin  10 mg Oral QHS  . enoxaparin (LOVENOX) injection  30 mg Subcutaneous Daily  . enzalutamide  160 mg Oral QAC supper  . feeding supplement (ENSURE ENLIVE)  237 mL Oral BID BM  . feeding  supplement (PRO-STAT SUGAR FREE 64)  30 mL Oral BID  . ferrous sulfate  325 mg Oral Q breakfast  . Influenza vac split quadrivalent PF  0.5 mL Intramuscular Tomorrow-1000  . insulin aspart  0-5 Units Subcutaneous QHS  . insulin aspart  0-9 Units Subcutaneous TID WC  . multivitamin with minerals  1 tablet Oral Daily  . pantoprazole  40 mg Oral BID    Continuous Infusions: . sodium chloride 75 mL/hr at 01/22/17 1723  . sodium chloride Stopped (01/22/17 0215)     Time spent: 58mins I have personally reviewed and interpreted on  01/23/2017 daily labs, tele strips, imagings as discussed above under date review session and assessment and plans.  I reviewed all  nursing notes, pharmacy notes, consultant notes,  vitals, pertinent old records  I have discussed plan of care as described above with RN , patient and family on 01/23/2017   Azalya Galyon MD, PhD  [facility] Pager [phone]. If 7PM-7AM, please contact night-coverage at [email or web address], password [facility] 01/23/2017, 7:39 AM  LOS: 1 day

## 2017-01-23 NOTE — Consult Note (Signed)
Consultation  Referring Provider: [facility] / Dr Annamaria Boots Primary Care Physician:  Golden Circle, FNP Primary Gastroenterologist:  Dr. Henrene Pastor, to be seeing Dr. Silverio Decamp in November;  Reason for Consultation:   Achalasia - dysphagia , dehydration  HPI: [name] is a 76 y.o. male , known to GI with history of achalasia. He was seen by Dr. Ardis Hughs in our office in February 2018, and at that time was stable on a full liquid diet. He has had several endoscopies over the past couple of years. He had EGD in January 2018 per Dr. Carlean Purl which showed a dilated food and fluid filled esophagus. The GE junction was dilated with a TTS balloon to 20 mm and then Botox was injected in the LES. The patient called in September 2018 stating that he felt that his swallowing was getting worse again. His wife was concerned because she just hadn't been eating much. Was not clear whether or not he was actually regurgitating. He was set up for urgent EGD which was done by Dr. Havery Moros on 01/01/2017. This showed [place] 3 cm hiatal hernia a severely dilated esophagus and a small amount of retained fluid there was no food in the esophagus and no obstruction. The LES was noted to be hypertonic. He had TTS dilation from 18-20 mm and then Botox 4.He was admitted to the hospital yesterday after he presented with generalized weakness and was found to be dehydrated with a creatinine of 3. Patient is not a great historian. He says that he is been very fatigued and weak recently. His wife says he has been so weak he is not been able to get out of bed over the past couple of weeks.She says she tries to eat a few bites and then just falls asleep. He has no specific complaints of fever or chills, no shortness of breath chest pain or abdominal pain no nausea or vomiting. He says he is not having any trouble swallowing food, he just does not have an appetite. He says he thinks he is hungry and then either smells food or tries a  few bites and he just does not want any more. His wife said he really had not been eating any solid food at all since his last EGD and taking very little in the way of supplements and liquids though she was offering him in shore milk juice ice cream etc. He denies depression. She says he has had some mild confusion off and on the past couple of weeks. He says he feels "funny" at times after taking insulin. He also mentioned that he's been on a new medication for his prostate cancer per Dr. Gaynelle Arabian which was started in July 2018. He is on Xtandi. I believe he has metastatic prostate cancer. Chest x-ray 01/18/2017 negative CBC yesterday WBC of 9.5, hemoglobin 9.3 hematocrit of 28.5, a.m. Cortisol yesterday elevated at 33.2,, sodium 143, potassium 3.9, BUN 100, creatinine 3.26,LFTs within normal limits, albumin 2.4 No recent abdominal pelvic or urologic imaging available  In EPIC        Past Medical History:  Diagnosis Date  . Anemia    IRON TRANSFUSION 1 MONTH AGO  . Blood clot in vein 2008   LEFT LEG  . Cataract    LEFT  . CKD (chronic kidney disease) 02/2016  . Diabetes mellitus, type 2 ([place]) 2008  . GERD (gastroesophageal reflux disease)   . Hyperlipidemia 2008  . Hypertension 2008  . Pneumonia 01/18/2016  . Prostate  cancer ([place]) 2006   IMPLANT IN LEFT ARM FOR [place]    Past Surgical History:  Procedure Laterality Date  . BALLOON DILATION N/A 04/13/2016   Procedure: BALLOON DILATION;  Surgeon: Gatha Mayer, MD;  Location: [facility] ENDOSCOPY;  Service: Endoscopy;  Laterality: N/A;  . BOTOX INJECTION N/A 04/13/2016   Procedure: BOTOX INJECTION;  Surgeon: Gatha Mayer, MD;  Location: [facility];  Service: Endoscopy;  Laterality: N/A;  . BOTOX INJECTION N/A 01/02/2017   Procedure: BOTOX INJECTION;  Surgeon: Yetta Flock, MD;  Location: [facility] ENDOSCOPY;  Service: Gastroenterology;  Laterality: N/A;  . COLONOSCOPY W/ POLYPECTOMY  03/2008   diminutive rectal polyp (path: prolapse  type polyp, not adenoma).  moderate pan-diverticulitis.   [facility] Kitchen ESOPHAGEAL MANOMETRY  1984   unable to pass manometry catheter beyond LES. Aperiystalsis of esophagus: sugg of achalasia.   Marland Kitchen ESOPHAGOGASTRODUODENOSCOPY N/A 02/17/2016   Procedure: ESOPHAGOGASTRODUODENOSCOPY (EGD);  Surgeon: [name], MD;  Location: [facility] ENDOSCOPY;  Service: Endoscopy;  Laterality: N/A;  . ESOPHAGOGASTRODUODENOSCOPY (EGD) WITH PROPOFOL N/A 02/21/2016   Procedure: ESOPHAGOGASTRODUODENOSCOPY (EGD) WITH PROPOFOL;  Surgeon: Jerene Bears, MD;  Location: [facility] ENDOSCOPY;  Service: Gastroenterology;  Laterality: N/A;  . ESOPHAGOGASTRODUODENOSCOPY (EGD) WITH PROPOFOL N/A 02/28/2016   Procedure: ESOPHAGOGASTRODUODENOSCOPY (EGD) WITH PROPOFOL withBOTOX;  Surgeon: Manus Gunning, MD;  Location: [facility] ENDOSCOPY;  Service: Gastroenterology;  Laterality: N/A;  . ESOPHAGOGASTRODUODENOSCOPY (EGD) WITH PROPOFOL N/A 04/13/2016   Procedure: ESOPHAGOGASTRODUODENOSCOPY (EGD) WITH PROPOFOL;  Surgeon: Gatha Mayer, MD;  Location: [place];  Service: Endoscopy;  Laterality: N/A;  . ESOPHAGOGASTRODUODENOSCOPY (EGD) WITH PROPOFOL N/A 01/02/2017   Procedure: ESOPHAGOGASTRODUODENOSCOPY (EGD) WITH PROPOFOL;  Surgeon: Yetta Flock, MD;  Location: [facility] ENDOSCOPY;  Service: Gastroenterology;  Laterality: N/A;  . LEFT ARM IMPLANT X2     RADIATION TX ALSO    Prior to Admission medications   Medication Sig Start Date End Date Taking? Authorizing Provider  Amino Acids-Protein Hydrolys (FEEDING SUPPLEMENT, PRO-STAT SUGAR FREE 64,) LIQD Take 30 mLs by mouth 2 (two) times daily. Patient taking differently: Take 30 mLs by mouth daily.  04/13/16  Yes Dessa Phi Chahn-Yang, DO  atenolol (TENORMIN) 25 MG tablet Take 1 tablet (25 mg total) by mouth daily. 07/09/16  Yes Elayne Snare, MD  atorvastatin (LIPITOR) 10 MG tablet Take 1 tablet (10 mg total) by mouth at bedtime. 07/09/16  Yes Elayne Snare, MD  docusate sodium (COLACE) 100 MG capsule TAKE ONE  CAPSULE BY MOUTH TWICE A DAY FOR CONSTIPATION Patient taking differently: TAKE ONE CAPSULE BY MOUTH TWICE A DAY 07/04/16  Yes Elayne Snare, MD  enzalutamide Gillermina Phy) 40 MG capsule Take 160 mg by mouth daily. 1700   Yes [provider]  ferrous sulfate 325 (65 FE) MG tablet TAKE 1 TABLET BY MOUTH EVERY DAY FOR SUPPLEMENT 11/27/16  Yes Elayne Snare, MD  furosemide (LASIX) 80 MG tablet Take 80 mg by mouth daily. 12/26/16  Yes [provider]  glipiZIDE (GLUCOTROL XL) 5 MG 24 hr tablet Take 1 tablet (5 mg total) by mouth daily with breakfast. 12/28/16  Yes Elayne Snare, MD  Multiple Vitamin (MULTIVITAMIN WITH MINERALS) TABS tablet Take 1 tablet by mouth daily.   Yes [provider]  pantoprazole (PROTONIX) 40 MG tablet Take 1 tablet (40 mg total) by mouth 2 (two) times daily. 07/09/16  Yes Elayne Snare, MD  pioglitazone (ACTOS) 15 MG tablet Take 1 tablet (15 mg total) by mouth daily. 11/19/16  Yes Elayne Snare, MD  TRADJENTA 5 MG TABS  tablet TAKE 1 TABLET (5 MG TOTAL) BY MOUTH DAILY. 01/18/17  Yes Elayne Snare, MD  ACCU-CHEK FASTCLIX LANCETS MISC Use to check blood sugars twice daily E11.65 11/08/16   Elayne Snare, MD  acetaminophen (TYLENOL) 650 MG CR tablet Take 650 mg by mouth every 4 (four) hours as needed for pain.    [provider]  glucose blood test strip Use as instructed 11/08/16   Elayne Snare, MD  ondansetron (ZOFRAN-ODT) 4 MG disintegrating tablet Take 1 tablet (4 mg total) by mouth every 8 (eight) hours as needed for nausea or vomiting. 01/18/17   Golden Circle, FNP    Current Facility-Administered Medications  Medication Dose Route Frequency Provider Last Rate Last Dose  . 0.45 % sodium chloride infusion   Intravenous Continuous Ivor Costa, MD 75 mL/hr at 01/23/17 6834    . acetaminophen (TYLENOL) tablet 650 mg  650 mg Oral Q4H PRN Ivor Costa, MD      . atorvastatin (LIPITOR) tablet 10 mg  10 mg Oral QHS Ivor Costa, MD   10 mg at 01/22/17 2206  . enoxaparin  (LOVENOX) injection 30 mg  30 mg Subcutaneous Daily Ivor Costa, MD   30 mg at 01/23/17 1050  . enzalutamide (XTANDI) capsule 160 mg  160 mg Oral QAC supper Ivor Costa, MD   160 mg at 01/22/17 1737  . feeding supplement (ENSURE ENLIVE) (ENSURE ENLIVE) liquid 237 mL  237 mL Oral BID BM Ivor Costa, MD   237 mL at 01/23/17 1049  . feeding supplement (PRO-STAT SUGAR FREE 64) liquid 30 mL  30 mL Oral BID Ivor Costa, MD   30 mL at 01/23/17 1050  . ferrous sulfate tablet 325 mg  325 mg Oral Q breakfast Ivor Costa, MD   325 mg at 01/23/17 1962  . hydrALAZINE (APRESOLINE) injection 5 mg  5 mg Intravenous Q2H PRN Ivor Costa, MD      . hydrOXYzine (ATARAX/VISTARIL) tablet 10 mg  10 mg Oral TID PRN Ivor Costa, MD      . insulin aspart (novoLOG) injection 0-5 Units  0-5 Units Subcutaneous QHS Ivor Costa, MD      . insulin aspart (novoLOG) injection 0-9 Units  0-9 Units Subcutaneous TID WC Ivor Costa, MD   2 Units at 01/22/17 1722  . multivitamin with minerals tablet 1 tablet  1 tablet Oral Daily Ivor Costa, MD   1 tablet at 01/23/17 1049  . pantoprazole (PROTONIX) EC tablet 40 mg  40 mg Oral BID Ivor Costa, MD   40 mg at 01/23/17 1050  . sodium chloride 0.9 % bolus 500 mL  500 mL Intravenous Once Ivor Costa, MD   Stopped at 01/22/17 0215  . zolpidem (AMBIEN) tablet 5 mg  5 mg Oral QHS PRN Ivor Costa, MD        Allergies as of 01/21/2017  . (No Known Allergies)    Family History  Problem Relation Age of Onset  . Diabetes Father   . Diabetes Sister   . Hypertension Sister   . Hypertension Brother   . Heart disease Neg Hx     Social History   Social History  . Marital status: Married    Spouse name: N/A  . Number of children: 2  . Years of education: 15   Occupational History  . retired    Social History Main Topics  . Smoking status: Former Smoker    Years: 10.00    Quit date: 2000  . Smokeless tobacco: Never Used  .  Alcohol use No  . Drug use: No  . Sexual activity: Not on file    Other Topics Concern  . Not on file   Social History Narrative   Fun/Hobby: Football    Review of Systems: Pertinent positive and negative review of systems were noted in the above HPI section.  All other review of systems was otherwise negative.  Physical Exam: Vital signs in last 24 hours: Temp:  [98.3 F (36.8 C)-98.7 F (37.1 C)] 98.3 F (36.8 C) (10/17 0424) Pulse Rate:  [74-75] 74 (10/17 0424) Resp:  [20-22] 22 (10/17 0424) BP: (106-117)/(53-64) 106/53 (10/17 0424) SpO2:  [100 %] 100 % (10/17 0424) Weight:  [121 lb 0.5 oz (54.9 kg)] 121 lb 0.5 oz (54.9 kg) (10/17 0424) Last BM Date: 01/21/17 General:   Alert,  Well-developed, thin elderly African-American male, his wife is at bedside. Head:  Normocephalic and atraumatic. Eyes:  Sclera clear, no icterus.   Conjunctiva pink. Ears:  Normal auditory acuity. Nose:  No deformity, discharge,  or lesions. Mouth:  No deformity or lesions. Tongue coated  Neck:  Supple; no masses or thyromegaly. Lungs:  Clear throughout to auscultation.   No wheezes, crackles, or rhonchi. Heart:  Regular rate and rhythm; no murmurs, clicks, rubs,  or gallops. Abdomen:  Soft,nontender, BS active,nonpalp mass or hsm.   Rectal:  Deferred  Msk:  Symmetrical without gross deformities. . Pulses:  Normal pulses noted. Extremities:  Without clubbing or edema. Neurologic:  Alert and  oriented x3  grossly normal neurologically. Skin:  Intact without significant lesions or rashes.. Psych:  Alert and cooperative. Normal mood and affect.  Intake/Output from previous day: 10/16 0701 - 10/17 0700 In: 2492.5 [P.O.:120; I.V.:2372.5] Out: 100 [Urine:100] Intake/Output this shift: No intake/output data recorded.  Lab Results:  Recent Labs  01/21/17 1934 01/22/17 0236  WBC 8.6 9.5  HGB 8.3* 9.3*  HCT 25.0* 28.5*  PLT 211 237   BMET  Recent Labs  01/21/17 1949 01/22/17 0236 01/23/17 0043  NA 151* 148* 143  K 3.3* 3.4* 3.9  CL 113* 105 107   CO2 24 22 26   GLUCOSE 233* 192* 65  BUN 102* 111* 100*  CREATININE 3.23* 3.58* 3.26*  CALCIUM 7.8* 9.0 8.9   LFT  Recent Labs  01/21/17 1949  PROT 5.0*  ALBUMIN 2.4*  AST 16  ALT 9*  ALKPHOS 82  BILITOT 0.5        IMPRESSION:  #79 76 year old African-American male with long-standing esophageal achalasia who has undergone balloon dilation and Botox injections twice this year, last was done less than a month ago. At that time he did not have any food or fluid in his esophagus and no obstruction. Patient admitted yesterday with progressive weakness, failure to thrive and dehydration. On further conversation with the patient and his wife I do not think dysphagia is his primary issue. He has anorexia, and no appetite, and complaints of profound weakness and fatigue. Patient has been on treatment for what sounds like metastatic prostate cancer with Gillermina Phy since July 2018 and is followed by [facility]. Patient may be having adverse medication side effects causing anorexia and weakness. Rule out progression of metastatic prostate cancer  #2 acute on chronic renal insufficiency-rule out obstructive component #3 normocytic anemia #4 protein calorie malnutrition  Plan; would hold [facility] urology records and imaging from [facility] and/or obtain urology consultation Pured diet with supplements Feed in upright position Consider adding a trial of Megace and/or Remeron if  other etiologies of anorexia are ruled out. I discussed PEG tube with the patient briefly which he would like to avoid all costs. Do not plan any other GI intervention/procedures until other medical issues are sorted out.  We will follow with you   Amy Esterwood  01/23/2017, 11:09 AM  ________________________________________________________________________  [facility] MD note:  I personally examined the patient, reviewed the data and agree with the assessment and plan described above.  He has  long history of achalasia, underwent botox injection 2 weeks ago and that usually holds for several months for him in the past.  In the room today, he and his son's indicate that the achalasia is again causing problems (vomiting during meals, dysphagia).  On the other hand, previous history by PA and his wife present seemed to point more towards an anorexia, weakness issue.   He has prostate cancer but I cannot tell how advanced it is. Gillermina Phy was started 17months ago and side effect profile does list anorexia and weakness so perhaps some of his symptoms are ADR.  It would be helpful to have urology or medical oncology input here.  If this is all from achalasia then it is safe to say botox injection are not helping him.  He is scheduled to meet Dr. Silverio Decamp as an outpatient next month, to consider other options (pneumatic dilation) he no showed for that appt earlier this year.    G tube feeding seems like it would be helpful however he adamantly declines.  Rec's: urology or medical oncology input on the severity of his prostate cancer, comment on possible side effect of Xtandi.  I will order barium esophagram to evaluate achalasia again.   Owens Loffler, MD [facility] Gastroenterology Pager [phone]

## 2017-01-24 ENCOUNTER — Inpatient Hospital Stay (HOSPITAL_COMMUNITY): Payer: Medicare Other

## 2017-01-24 LAB — BASIC METABOLIC PANEL
Anion gap: 11 (ref 5–15)
BUN: 86 mg/dL — ABNORMAL HIGH (ref 6–20)
CO2: 25 mmol/L (ref 22–32)
Calcium: 8.7 mg/dL — ABNORMAL LOW (ref 8.9–10.3)
Chloride: 103 mmol/L (ref 101–111)
Creatinine, Ser: 2.83 mg/dL — ABNORMAL HIGH (ref 0.61–1.24)
GFR calc Af Amer: 24 mL/min — ABNORMAL LOW (ref >60)
GFR calc non Af Amer: 20 mL/min — ABNORMAL LOW (ref >60)
Glucose, Bld: 85 mg/dL (ref 65–99)
Potassium: 3.7 mmol/L (ref 3.5–5.1)
Sodium: 139 mmol/L (ref 135–145)

## 2017-01-24 LAB — GLUCOSE, CAPILLARY
GLUCOSE-CAPILLARY: 108 mg/dL — AB (ref 65–99)
Glucose-Capillary: 73 mg/dL (ref 65–99)
Glucose-Capillary: 114 mg/dL — ABNORMAL HIGH (ref 65–99)
Glucose-Capillary: 138 mg/dL — ABNORMAL HIGH (ref 65–99)
Glucose-Capillary: 82 mg/dL (ref 65–99)

## 2017-01-24 LAB — TSH: TSH: 4.014 u[IU]/mL (ref 0.350–4.500)

## 2017-01-24 LAB — MAGNESIUM: Magnesium: 1.9 mg/dL (ref 1.7–2.4)

## 2017-01-24 LAB — CORTISOL: Cortisol, Plasma: 17.6 ug/dL

## 2017-01-24 LAB — PSA: PROSTATIC SPECIFIC ANTIGEN: 109.26 ng/mL — AB (ref 0.00–4.00)

## 2017-01-24 MED ORDER — DEXTROSE-NACL 5-0.45 % IV SOLN
INTRAVENOUS | Status: AC
Start: 2017-01-24 — End: 2017-01-25
  Administered 2017-01-24: via INTRAVENOUS

## 2017-01-24 NOTE — Progress Notes (Signed)
Awaiting on esophagram, which will hopefully be performed today.  Will check back in on the patient tomorrow.

## 2017-01-24 NOTE — Progress Notes (Signed)
[id number]/tct-wife-woould like to use [facility] for hhc will notify when hhc orders are written./Adayah Arocho,BSN,RN3,CCM/[id number]

## 2017-01-24 NOTE — Progress Notes (Signed)
LCSW consulted for possible SNF placement.  Met with patient bedside he prefers home health,but asked to consult his wife. Spoke to patients wife by phone and she prefers home health as well.  Notified Suanne Marker, RNCM that patient and family prefers home health.    Carolin Coy [place]

## 2017-01-24 NOTE — Progress Notes (Signed)
OT Cancellation Note  Patient Details Name: [name] MRN: [id number] DOB: 1941/02/08   Cancelled Treatment:    Reason Eval/Treat Not Completed: Other (comment).  Pt just finished walking with PT  Will check back another day.  Deone Omahoney 01/24/2017, 2:57 PM  Lesle Chris, OTR/L [phone] 01/24/2017

## 2017-01-24 NOTE — Progress Notes (Signed)
PROGRESS NOTE  [name]:[id number] DOB: [birth year] DOA: 01/21/2017 PCP: Golden Circle, FNP  HPI/Recap of past 24 hours:  Vomited twice today, denies pain He reports has his eyes closed to rest, reports has been feeling tired. No fever Am blood sugar is low  Assessment/Plan: Principal Problem:   Generalized weakness Active Problems:   Type II diabetes mellitus with renal manifestations (HCC)   Hyperlipidemia associated with type 2 diabetes mellitus (HCC)   Essential hypertension   PROSTATE CANCER, HX OF   Hypokalemia   Acute renal failure superimposed on stage 3 chronic kidney disease (HCC)   Protein-calorie malnutrition, severe   Dehydration   Anemia   Achalasia   Hypernatremia   Hypotension   Chronic diastolic CHF (congestive heart failure) (HCC)   Anorexia/weight loss/dehydration/weakness/hypotensnion/FTT: -constellation of symptom likely from dehydration from esophageal achalasia, -with h/o esophageal achalasia s/p botox infection a few weeks ago, I have requested GI consult -patient also has a h/o metastatic prostate cancer, he was on zytiga and prednisione, but It seems has discontinued due to fatigue and anorexia, he is recently started on xtandi, he has been off steroids for several months. his am cortisol level is 33. -I have consulted oncology Dr Alen Blew to evaluate patient. Not sure cancer progression or meds side effect could contribute to above  symptoms , I have ordered psa. Will order ct chest /ab/pel ( no contrast due to renal impairment) -there is also significant elevated bun, bun seems has progressively worsened over the last year,  From normal bun in march 2018 to now above 100, this could cause anorexia n/v as well. - tsh 4.   Hypernatremia: likely from dehydration -Sodium 151 on presentation -Normalized with hydration -continue gentle hydration due to persistent poor oral intatke  Hypokalemia: replaced, check mag  NSVT; 4 runs on  tele, does not appear symptomatic Betablocker held due to hypotension Keep k>4, mag>2  AKI on CKD IV: -uremia? -ua no blood, no protein, no wbc, no infection -seems improved some with hydration, -ct ab/pel without contrast ordered -may need nephrology consult if no significant improvement in bun/cr with continued hydration.  Home meds lasix held since admission -renal dosing meds, avoid nephrotoxin. Close monitor urine output -improving with hydration  noninsulin dependent dm2,  a1c 7.2 Am blood sugar 65, hold all home oral meds On ssi and hypoglycemia protocol. Start d5half saline  H/o HLD, last ldl 63, continue home meds lipitor at 10mg  daily  HTN; presented with hypotension Home meds atenolol and lasix held, on hydration Consider switch to lopressor if patient need to start back on bp meds due to no need of renal dosing compare to atenolol.  Chronic diastolic chf: currently dehydrated, lasix held, on hydration  Chronic normocytic anemia: no sign of bleeding. Likely component of anemia of chronic disease. He is also on chronic iron supplement, which is continued.  Severe malnutrition with recent weight loss , fat depletion and muscle wasting on exam. Nutrition consulted.   Code Status: full  Family Communication: patient   Disposition Plan: pending,    Consultants:  [facility]  Oncology Dr Alen Blew  Procedures:  none  Antibiotics:  none   Objective: BP 95/61 (BP Location: Right Arm)   Pulse 71   Temp 98.3 F (36.8 C) (Oral)   Resp 20   Ht 5\' 8"  (1.727 m)   Wt 59.3 kg (130 lb 11.7 oz)   SpO2 100%   BMI 19.88 kg/m   Intake/Output Summary (Last 24 hours)  at 01/24/17 0757 Last data filed at 01/24/17 0629  Gross per 24 hour  Intake          1846.25 ml  Output                0 ml  Net          1846.25 ml   Filed Weights   01/22/17 0608 01/23/17 0424 01/24/17 0433  Weight: 53.6 kg (118 lb 2.7 oz) 54.9 kg (121 lb 0.5 oz) 59.3 kg (130 lb 11.7 oz)     Exam: Patient is examined daily including today on 01/24/2017, exams remain the same as of yesterday except that has changed    General:  Weak, lethargy, oriented x3, he does not open eyes during conversation (report wants to rest)  Cardiovascular: RRR  Respiratory: CTABL  Abdomen: Soft/ND/NT, positive BS  Musculoskeletal: No Edema  Neuro: lethargic, oriented   Data Reviewed: Basic Metabolic Panel:  Recent Labs Lab 01/18/17 1100 01/21/17 1949 01/22/17 0236 01/23/17 0043 01/24/17 0708  NA 146* 151* 148* 143 139  K 3.4* 3.3* 3.4* 3.9 3.7  CL 99 113* 105 107 103  CO2 26 24 22 26 25   GLUCOSE 252* 233* 192* 65 85  BUN 74* 102* 111* 100* 86*  CREATININE 2.81* 3.23* 3.58* 3.26* 2.83*  CALCIUM 9.7 7.8* 9.0 8.9 8.7*  MG  --   --  2.6*  --  1.9   Liver Function Tests:  Recent Labs Lab 01/18/17 1100 01/21/17 1949  AST 16 16  ALT 8 9*  ALKPHOS 83 82  BILITOT 0.5 0.5  PROT 6.9 5.0*  ALBUMIN 3.5 2.4*   No results for input(s): LIPASE, AMYLASE in the last 168 hours. No results for input(s): AMMONIA in the last 168 hours. CBC:  Recent Labs Lab 01/18/17 1100 01/21/17 1934 01/22/17 0236  WBC 11.2* 8.6 9.5  NEUTROABS 9.4* 7.0  --   HGB 10.9* 8.3* 9.3*  HCT 34.7* 25.0* 28.5*  MCV 96.5 94.0 94.1  PLT 263.0 211 237   Cardiac Enzymes:   No results for input(s): CKTOTAL, CKMB, CKMBINDEX, TROPONINI in the last 168 hours. BNP (last 3 results)  Recent Labs  01/21/17 2348  BNP 106.5*    ProBNP (last 3 results) No results for input(s): PROBNP in the last 8760 hours.  CBG:  Recent Labs Lab 01/23/17 0733 01/23/17 1139 01/23/17 1704 01/23/17 2052 01/24/17 0731  GLUCAP 66 172* 138* 109* 73    Recent Results (from the past 240 hour(s))  Culture, blood (Routine X 2) w Reflex to ID Panel     Status: None (Preliminary result)   Collection Time: 01/21/17 11:48 PM  Result Value Ref Range Status   Specimen Description BLOOD RIGHT HAND  Final   Special  Requests IN PEDIATRIC BOTTLE Blood Culture adequate volume  Final   Culture   Final    NO GROWTH 1 DAY Performed at [facility], [place] [address] [place], [place] [address]    Report Status PENDING  Incomplete  Culture, blood (Routine X 2) w Reflex to ID Panel     Status: None (Preliminary result)   Collection Time: 01/22/17  2:36 AM  Result Value Ref Range Status   Specimen Description BLOOD RIGHT HAND  Final   Special Requests IN PEDIATRIC BOTTLE Blood Culture adequate volume  Final   Culture   Final    NO GROWTH 1 DAY Performed at [facility], [place] [address], [place], [place] [address]  Report Status PENDING  Incomplete     Studies: Ct Abdomen Pelvis Wo Contrast  Result Date: 01/24/2017 CLINICAL DATA:  Advanced prostate cancer, post systemic therapy. Assess treatment response. Oral contrast only. EXAM: CT CHEST, ABDOMEN AND PELVIS WITHOUT CONTRAST TECHNIQUE: Multidetector CT imaging of the chest, abdomen and pelvis was performed following the standard protocol without IV contrast. COMPARISON:  CT abdomen and pelvis from 11/07/2015, PET CT 11/17/2015 FINDINGS: CT CHEST FINDINGS Cardiovascular: The heart size is normal with coronary arteriosclerosis along the LAD, RCA and circumflex. No aortic aneurysm. Minimal aortic atherosclerosis at the arch and along the descending portion. The unenhanced pulmonary vasculature is unremarkable. Mediastinum/Nodes: Calcified right lower paratracheal lymph nodes are noted without adenopathy. No axillary, mediastinal or hilar lymphadenopathy given limitations of a noncontrast study. Marked contrast distention of the esophagus tapering at the GE junction consistent with achalasia. Contrast is noted within the stomach suggesting patency of the GE junction. Lungs/Pleura: 2.1 cm thin walled cavitation is relatively stable in appearance relative to PET-CT noted within the right lower lobe. No effusion or pneumothorax. No pneumonic consolidation.  Scarring is seen at the left lung apex. Musculoskeletal: No chest wall mass or suspicious bone lesions identified. CT ABDOMEN PELVIS FINDINGS Hepatobiliary: No focal liver abnormality is seen. No gallstones, gallbladder wall thickening, or biliary dilatation. The gallbladder appears contracted. Pancreas: No ductal dilatation. Pancreatic tail calcification may reflect stigmata of chronic pancreatitis. Spleen: Normal size without mass. Adrenals/Urinary Tract: Chronic thickening of the left adrenal gland without focal mass. The right adrenal gland is unremarkable. Bilateral renal cysts are noted without evidence of obstructive uropathy. No hydroureteronephrosis. The urinary bladder is physiologically distended. Stomach/Bowel: Contrast reaches the rectum. There is colonic diverticulosis without acute diverticulitis. Normal small bowel rotation without obstruction or inflammation. Normal appendix. Vascular/Lymphatic: Mild aortoiliac atherosclerosis without aneurysm. No lymphadenopathy. Reproductive: Enlarged prostate with coarse peripheral zone calcifications are again identified. Presacral, perirectal homogeneous soft tissue masses are again visualized which appear to have increased in size from prior PET-CT, the more anterior and left-sided mass measuring 5 x 2.1 cm, series 2, image 106 versus 2.5 x 2.8 cm previously and the more midline posterior presacral mass is estimated at 3.2 x 4.3 cm versus 2.9 x 2.6 cm, as measured on series 2, image 109. Other: No free air nor free fluid. Musculoskeletal: Subchondral cystic change of the right acetabulum, left iliac bone adjacent to the SI joint with patchy osteopenic appearance of the iliac bone. No definite osteoblastic disease. IMPRESSION: 1. Stable 2.1 cm thin walled cavitation in the right lower lobe. 2. Marked contrast distention of the esophagus consistent with achalasia. 3. Interval increase in size of presacral soft tissue masses now estimated at 5 x 2.1 cm versus 2.5  x 2.8 cm and 3.2 x 4.3 cm versus 2.9 x 2.6 cm despite reported systemic therapy for recurrence of prostate cancer. 4. Stable thickening of the left adrenal gland with right-sided water attenuating renal cysts. Electronically Signed   By: Ashley Royalty M.D.   On: 01/24/2017 01:03   Ct Chest Wo Contrast  Result Date: 01/24/2017 : CLINICAL DATA: Advanced prostate cancer, post systemic therapy. Assess treatment response. Oral contrast only. EXAM: CT CHEST without IV contrast TECHNIQUE: Multidetector CT imaging of the chest, abdomen and pelvis was performed following the standard protocol without IV contrast. COMPARISON: CT abdomen and pelvis from 11/07/2015, PET CT 11/17/2015 FINDINGS: This report was included as part of the abdomen and pelvis CT dictation. No change to the body of the report.  Cardiovascular: The heart size is normal with coronary arteriosclerosis along the LAD, RCA and circumflex. No aortic aneurysm. Minimal aortic atherosclerosis at the arch and along the descending portion. The unenhanced pulmonary vasculature is unremarkable. Mediastinum/Nodes: Calcified right lower paratracheal lymph nodes are noted without adenopathy. No axillary, mediastinal or hilar lymphadenopathy given limitations of a noncontrast study. Marked contrast distention of the esophagus tapering at the GE junction consistent with achalasia. Contrast is noted within the stomach suggesting patency of the GE junction. Lungs/Pleura: 2.1 cm thin walled cavitation is relatively stable in appearance relative to PET-CT noted within the right lower lobe. No effusion or pneumothorax. No pneumonic consolidation. Scarring is seen at the left lung apex. Musculoskeletal: No chest wall mass or suspicious bone lesions identified. IMPRESSION: Stable 2.1 cm thin walled cavitation in the right lower lobe. Dilated contrast distention of the esophagus with tapering at the GE junction consistent with an achalasia. Aortic Atherosclerosis (ICD10-I70.0).  Electronically Signed   By: Ashley Royalty M.D.   On: 01/24/2017 01:55    Scheduled Meds: . atorvastatin  10 mg Oral QHS  . enoxaparin (LOVENOX) injection  30 mg Subcutaneous Daily  . enzalutamide  160 mg Oral QAC supper  . feeding supplement (ENSURE ENLIVE)  237 mL Oral BID BM  . feeding supplement (PRO-STAT SUGAR FREE 64)  30 mL Oral BID  . ferrous sulfate  325 mg Oral Q breakfast  . insulin aspart  0-9 Units Subcutaneous TID WC  . multivitamin with minerals  1 tablet Oral Daily  . pantoprazole  40 mg Oral BID    Continuous Infusions: . sodium chloride 75 mL/hr at 01/23/17 0821  . sodium chloride Stopped (01/22/17 0215)     Time spent: 79mins I have personally reviewed and interpreted on  01/24/2017 daily labs, tele strips, imagings as discussed above under date review session and assessment and plans.  I reviewed all nursing notes, pharmacy notes, consultant notes,  vitals, pertinent old records  I have discussed plan of care as described above with RN , patient  on 01/24/2017   Shenaya Lebo MD, PhD  [facility] Pager [phone]. If 7PM-7AM, please contact night-coverage at [email or web address], password [facility] 01/24/2017, 7:57 AM  LOS: 2 days

## 2017-01-24 NOTE — Progress Notes (Signed)
Physical Therapy Treatment Patient Details Name: [name] MRN: [id number] DOB: January 22, 1941 Today's Date: 01/24/2017    History of Present Illness  [name] is a 76 y.o. male with medical history significant of Hypertension, hyperlipidemia, diabetes mellitus, GERD, prostate cancer, CKD-3, anemia, esophageal achalasia (s/p of Botox injection), GI bleeding, dCHF, who presents with generalized weakness.    PT Comments    Pt required MAX encouragement to participate as he was fixated on another issue with the nurse.  See mobility details below.    Follow Up Recommendations  Home health PT;Supervision for mobility/OOB     Equipment Recommendations  None recommended by PT    Recommendations for Other Services       Precautions / Restrictions Precautions Precautions: Fall Restrictions Weight Bearing Restrictions: No    Mobility  Bed Mobility Overal bed mobility: Needs Assistance Bed Mobility: Supine to Sit;Sit to Supine     Supine to sit: Supervision Sit to supine: Supervision   General bed mobility comments: able to self perform with increased time and MAX encouragement   Transfers Overall transfer level: Needs assistance Equipment used: None Transfers: Sit to/from Stand;Stand Pivot Transfers Sit to Stand: Supervision;Min guard Stand pivot transfers: Supervision;Min guard       General transfer comment: one VC safety with turn completion  Ambulation/Gait Ambulation/Gait assistance: Min assist;Min guard Ambulation Distance (Feet): 125 Feet   Gait Pattern/deviations: Step-to pattern;Step-through pattern;Shuffle Gait velocity: decreased   General Gait Details: slow/shuffled gait with use of walker with increased time and < 25% VC's safety with turns and negociating around obsticles.  Pt admits he uses a cane "sometimes" at home.  Heavy lean on walker so did not attempt another devise.     Stairs            Wheelchair Mobility    Modified  Rank[place] (Stroke Patients Only)       Balance                                            Cognition Arousal/Alertness: Awake/alert Behavior During Therapy: WFL for tasks assessed/performed Overall Cognitive Status: Within Functional Limits for tasks assessed                                        Exercises      General Comments        Pertinent Vitals/Pain Pain Assessment: No/denies pain    Home Living                      Prior Function            PT Goals (current goals can now be found in the care plan section) Progress towards PT goals: Progressing toward goals    Frequency    Min 3X/week      PT Plan Current plan remains appropriate    Co-evaluation              AM-PAC PT "6 Clicks" Daily Activity  Outcome Measure  Difficulty turning over in bed (including adjusting bedclothes, sheets and blankets)?: A Little Difficulty moving from lying on back to sitting on the side of the bed? : A Little Difficulty sitting down on and standing up from a chair with arms (e.g., wheelchair, bedside  commode, etc,.)?: A Little Help needed moving to and from a bed to chair (including a wheelchair)?: A Little Help needed walking in hospital room?: A Little Help needed climbing 3-5 steps with a railing? : A Little 6 Click Score: 18    End of Session Equipment Utilized During Treatment: Gait belt Activity Tolerance: Patient tolerated treatment well Patient left: in bed;with call bell/phone within reach;with bed alarm set   PT Visit Diagnosis: Muscle weakness (generalized) (M62.81)     Time: 9021-1155 PT Time Calculation (min) (ACUTE ONLY): 18 min  Charges:  $Gait Training: 8-22 mins                    G Codes:       Rica Koyanagi  PTA [facility]  Acute  Rehab Pager      [phone]

## 2017-01-25 LAB — BASIC METABOLIC PANEL
ANION GAP: 9 (ref 5–15)
BUN: 77 mg/dL — ABNORMAL HIGH (ref 6–20)
CALCIUM: 8.5 mg/dL — AB (ref 8.9–10.3)
CO2: 24 mmol/L (ref 22–32)
Chloride: 103 mmol/L (ref 101–111)
Creatinine, Ser: 2.6 mg/dL — ABNORMAL HIGH (ref 0.61–1.24)
GFR, EST AFRICAN AMERICAN: 26 mL/min — AB (ref >60)
GFR, EST NON AFRICAN AMERICAN: 23 mL/min — AB (ref >60)
GLUCOSE: 141 mg/dL — AB (ref 65–99)
POTASSIUM: 3.7 mmol/L (ref 3.5–5.1)
Sodium: 136 mmol/L (ref 135–145)

## 2017-01-25 LAB — GLUCOSE, CAPILLARY
GLUCOSE-CAPILLARY: 126 mg/dL — AB (ref 65–99)
GLUCOSE-CAPILLARY: 89 mg/dL (ref 65–99)
GLUCOSE-CAPILLARY: 84 mg/dL (ref 65–99)
Glucose-Capillary: 158 mg/dL — ABNORMAL HIGH (ref 65–99)

## 2017-01-25 NOTE — Progress Notes (Deleted)
OT Cancellation Note  Patient Details Name: ROMYN BOSWELL MRN: [id number] DOB: 05-26-1940   Cancelled Treatment:    Reason Eval/Treat Not Completed: Patient at procedure or test/ unavailable.  Marland Kitchen  Alexei Doswell 01/25/2017, 2:18 PM  Lesle Chris, OTR/L [phone] 01/25/2017

## 2017-01-25 NOTE — Care Management Important Message (Signed)
Important Message  Patient Details  Name: [name] MRN: [id number] Date of Birth: 08-Apr-1941   Medicare Important Message Given:  Yes    Kerin Salen 01/25/2017, 12:10 PMImportant Message  Patient Details  Name: [name] MRN: [id number] Date of Birth: 1940-08-13   Medicare Important Message Given:  Yes    Kerin Salen 01/25/2017, 12:09 PM

## 2017-01-25 NOTE — Evaluation (Signed)
Clinical/Bedside Swallow Evaluation Patient Details  Name: [name] MRN: [id number] Date of Birth: 12-07-1940  Today's Date: 01/25/2017 Time: SLP Start Time (ACUTE ONLY): 1140 SLP Stop Time (ACUTE ONLY): 1201 SLP Time Calculation (min) (ACUTE ONLY): 21 min  Past Medical History:  Past Medical History:  Diagnosis Date  . Anemia    IRON TRANSFUSION 1 MONTH AGO  . Blood clot in vein 2008   LEFT LEG  . Cataract    LEFT  . CKD (chronic kidney disease) 02/2016  . Diabetes mellitus, type 2 ([place]) 2008  . GERD (gastroesophageal reflux disease)   . Hyperlipidemia 2008  . Hypertension 2008  . Pneumonia 01/18/2016  . Prostate cancer ([place]) 2006   IMPLANT IN LEFT ARM FOR [place]   Past Surgical History:  Past Surgical History:  Procedure Laterality Date  . BALLOON DILATION N/A 04/13/2016   Procedure: BALLOON DILATION;  Surgeon: Gatha Mayer, MD;  Location: [facility] ENDOSCOPY;  Service: Endoscopy;  Laterality: N/A;  . BOTOX INJECTION N/A 04/13/2016   Procedure: BOTOX INJECTION;  Surgeon: Gatha Mayer, MD;  Location: [place];  Service: Endoscopy;  Laterality: N/A;  . BOTOX INJECTION N/A 01/02/2017   Procedure: BOTOX INJECTION;  Surgeon: Yetta Flock, MD;  Location: [facility] ENDOSCOPY;  Service: Gastroenterology;  Laterality: N/A;  . COLONOSCOPY W/ POLYPECTOMY  03/2008   diminutive rectal polyp (path: prolapse type polyp, not adenoma).  moderate pan-diverticulitis.   Marland Kitchen ESOPHAGEAL MANOMETRY  1984   unable to pass manometry catheter beyond LES. Aperiystalsis of esophagus: sugg of achalasia.   Marland Kitchen ESOPHAGOGASTRODUODENOSCOPY N/A 02/17/2016   Procedure: ESOPHAGOGASTRODUODENOSCOPY (EGD);  Surgeon: [name], MD;  Location: [facility] ENDOSCOPY;  Service: Endoscopy;  Laterality: N/A;  . ESOPHAGOGASTRODUODENOSCOPY (EGD) WITH PROPOFOL N/A 02/21/2016   Procedure: ESOPHAGOGASTRODUODENOSCOPY (EGD) WITH PROPOFOL;  Surgeon: Jerene Bears, MD;  Location: [facility] ENDOSCOPY;  Service: Gastroenterology;   Laterality: N/A;  . ESOPHAGOGASTRODUODENOSCOPY (EGD) WITH PROPOFOL N/A 02/28/2016   Procedure: ESOPHAGOGASTRODUODENOSCOPY (EGD) WITH PROPOFOL withBOTOX;  Surgeon: Manus Gunning, MD;  Location: [facility] ENDOSCOPY;  Service: Gastroenterology;  Laterality: N/A;  . ESOPHAGOGASTRODUODENOSCOPY (EGD) WITH PROPOFOL N/A 04/13/2016   Procedure: ESOPHAGOGASTRODUODENOSCOPY (EGD) WITH PROPOFOL;  Surgeon: Gatha Mayer, MD;  Location: [place];  Service: Endoscopy;  Laterality: N/A;  . ESOPHAGOGASTRODUODENOSCOPY (EGD) WITH PROPOFOL N/A 01/02/2017   Procedure: ESOPHAGOGASTRODUODENOSCOPY (EGD) WITH PROPOFOL;  Surgeon: Yetta Flock, MD;  Location: [facility] ENDOSCOPY;  Service: Gastroenterology;  Laterality: N/A;  . LEFT ARM IMPLANT X2     RADIATION TX ALSO   HPI:  Pt is a 76 yo male adm to [facility] with weakness and malnutrition.  Pt has h/o achalasia, prostate cancer, HTN, HLD.  He has undergone botox tx for achalasia but continues with severe symptoms.  Per GI, pt to have full liquid diet and to follow up with office in late November   Assessment / Plan / Recommendation Clinical Impression  Pt without indication of oropharyngeal dysphagia and SLP provided multiple recipes for high calorie, high protein milkshakes. SLP reviewed with wife/pt that per GI pt is to be on [place] - Wife admits pt likes to eat reclined in chair and lays down soon after intake.   Reviewed importance of frequent small amounts, staying upright during and for a while after intake.  Also reviewed if pt senses residuals in esophagus to cease intake until senses clearance in hopes to prevent regurgitation. Provided pt with Resource clear to try, he reports that it's ok but does like particulary  like it.  No indication of oropharyngeal deficits with its intake.  Also note his DM that will need to be monitored with his liquid diet.    Education completed, thanks for referral.       Aspiration Risk  Risk for inadequate  nutrition/hydration    Diet Recommendation Thin liquid;Nectar-thick liquid   Liquid Administration via: Cup;Straw Medication Administration: Other (Comment) (consider liquid or crush ONLY if pharmacist approves) Supervision: Patient able to self feed Compensations: Slow rate;Small sips/bites;Other (Comment) (small frequent meals, stay upright after intake) Postural Changes: Remain upright for at least 30 minutes after po intake;Seated upright at 90 degrees    Other  Recommendations Recommended Consults: Consider GI evaluation;Consider esophageal assessment Oral Care Recommendations: Oral care BID   Follow up Recommendations None (GI as ordered)      Frequency and Duration            Prognosis        Swallow Study   General Date of Onset: 01/25/17 HPI: Pt is a 76 yo male adm to [facility] with weakness and malnutrition.  Pt has h/o achalasia, prostate cancer, HTN, HLD.  He has undergone botox tx for achalasia but continues with severe symptoms.  Per GI, pt to have full liquid diet and to follow up with office in late November Type of Study: Bedside Swallow Evaluation Diet Prior to this Study: Thin liquids Temperature Spikes Noted: No Respiratory Status: Room air History of Recent Intubation: No Behavior/Cognition: Alert;Cooperative;Pleasant mood Oral Cavity Assessment: Within Functional Limits Oral Care Completed by SLP: No Oral Cavity - Dentition: Missing dentition Vision: Functional for self-feeding Self-Feeding Abilities: Able to feed self Patient Positioning: Upright in bed Baseline Vocal Quality: Normal Volitional Cough: Strong    Oral/Motor/Sensory Function Overall Oral Motor/Sensory Function: Within functional limits   Ice Chips Ice chips: Not tested   Thin Liquid Thin Liquid: Within functional limits Presentation: Straw    Nectar Thick Nectar Thick Liquid: Not tested   Honey Thick Honey Thick Liquid: Not tested   Puree Puree: Not tested   Solid   GO   Solid: Not  tested        Macario Golds 01/25/2017,12:45 PM Luanna Salk, [facility] SLP [phone]

## 2017-01-25 NOTE — Progress Notes (Signed)
Occupational Therapy Treatment Patient Details Name: CLERENCE GUBSER MRN: [id number] DOB: 1941-02-09 Today's Date: 01/25/2017    History of present illness  CLEO VILLAMIZAR is a 76 y.o. male with medical history significant of Hypertension, hyperlipidemia, diabetes mellitus, GERD, prostate cancer, CKD-3, anemia, esophageal achalasia (s/p of Botox injection), GI bleeding, dCHF, who presents with generalized weakness.   OT comments  Pt got up to use bathroom and wash hands. Wanted to return to supine after this.  Steady with RW, cues for safety  Follow Up Recommendations  Supervision/Assistance - 24 hour    Equipment Recommendations  None recommended by OT    Recommendations for Other Services      Precautions / Restrictions Precautions Precautions: Fall Restrictions Weight Bearing Restrictions: No       Mobility Bed Mobility         Supine to sit: Supervision Sit to supine: Supervision      Transfers   Equipment used: Rolling walker (2 wheeled)   Sit to Stand: Supervision              Balance                                           ADL either performed or assessed with clinical judgement   ADL       Grooming: Wash/dry hands;Supervision/safety;Standing                   Toilet Transfer: Supervision/safety;Comfort height toilet;RW;Ambulation   Toileting- Water quality scientist and Hygiene: Supervision/safety;Sit to/from stand         General ADL Comments: pt brought feet up to adjust socks. Ambulated to bathroom with supervision; cues for RW safety     Vision       Perception     Praxis      Cognition Arousal/Alertness: Awake/alert Behavior During Therapy: [facility] for tasks assessed/performed Overall Cognitive Status: Within Functional Limits for tasks assessed                                          Exercises     Shoulder Instructions       General Comments      Pertinent Vitals/  Pain       Pain Assessment: No/denies pain  Home Living                                          Prior Functioning/Environment              Frequency  Min 2X/week        Progress Toward Goals  OT Goals(current goals can now be found in the care plan section)  Progress towards OT goals: Progressing toward goals     Plan      Co-evaluation                 AM-PAC PT "6 Clicks" Daily Activity     Outcome Measure   Help from another person eating meals?: None Help from another person taking care of personal grooming?: A Little Help from another person toileting, which includes using toliet, bedpan, or urinal?: A Little Help from another person bathing (including  washing, rinsing, drying)?: A Little Help from another person to put on and taking off regular upper body clothing?: A Little Help from another person to put on and taking off regular lower body clothing?: A Little 6 Click Score: 19    End of Session        Activity Tolerance Patient tolerated treatment well   Patient Left in bed;with call bell/phone within reach;with bed alarm set   Nurse Communication          Time: 4037-5436 OT Time Calculation (min): 10 min  Charges: OT General Charges $OT Visit: 1 Visit OT Treatments $Self Care/Home Management : 8-22 mins  Lesle Chris, OTR/L [phone] 01/25/2017   Yasemin Rabon 01/25/2017, 2:40 PM

## 2017-01-25 NOTE — Progress Notes (Signed)
PROGRESS NOTE  [name] EHU:[id number] DOB: 1940-09-12 DOA: 01/21/2017 PCP: [place], FNP  HPI/Recap of past 24 hours:  Feeling better this am, he seems stronger and sitting up in chair,  No fever   Assessment/Plan: Principal Problem:   Generalized weakness Active Problems:   Type II diabetes mellitus with renal manifestations ([place])   Hyperlipidemia associated with type 2 diabetes mellitus ([place])   Essential hypertension   PROSTATE CANCER, HX OF   Hypokalemia   Acute renal failure superimposed on stage 3 chronic kidney disease (HCC)   Protein-calorie malnutrition, severe   Dehydration   Anemia   Achalasia   Hypernatremia   Hypotension   Chronic diastolic CHF (congestive heart failure) (HCC)   Anorexia/weight loss/dehydration/weakness/hypotensnion/FTT: -constellation of symptom likely from dehydration from esophageal achalasia, -with h/o esophageal achalasia s/p botox infection a few weeks ago, I have requested GI consult -patient also has a h/o metastatic prostate cancer, he was on zytiga and prednisione, but It seems has discontinued due to fatigue and anorexia, he is recently started on xtandi, he has been off steroids for several months. his am cortisol level is 33. -I have consulted oncology Dr Alen Blew to evaluate patient. Not sure cancer progression or meds side effect could contribute to above  symptoms , I have ordered psa. Will order ct chest /ab/pel ( no contrast due to renal impairment) -there is also significant elevated bun, bun seems has progressively worsened over the last year,  From normal bun in march 2018 to now above 100, this could cause anorexia n/v as well. - tsh 4.   Hypernatremia: likely from dehydration -Sodium 151 on presentation -Normalized with hydration -continue gentle hydration due to persistent poor oral intatke  Hypokalemia: replaced, check mag  NSVT; 4 runs on tele, does not appear symptomatic Betablocker held due to  hypotension Keep k>4, mag>2  AKI on CKD IV: -uremia? -ua no blood, no protein, no wbc, no infection -seems improved some with hydration, -ct ab/pel without contrast ordered -may need nephrology consult if no significant improvement in bun/cr with continued hydration.  Home meds lasix held since admission -renal dosing meds, avoid nephrotoxin. Close monitor urine output -improving with hydration, now back to baseline  noninsulin dependent dm2,  a1c 7.2 Am blood sugar 65, hold all home oral meds On ssi and hypoglycemia protocol. Required d5half saline briefly Eating more, improved  H/o HLD, last ldl 63, continue home meds lipitor at 10mg  daily  HTN; presented with hypotension Home meds atenolol and lasix held, s/p hydration Consider switch to lopressor if patient need to start back on bp meds due to no need of renal dosing compare to atenolol.  Chronic diastolic chf: currently dehydrated, lasix held, now off hydration  Chronic normocytic anemia: no sign of bleeding. Likely component of anemia of chronic disease. He is also on chronic iron supplement, which is continued.  Severe malnutrition with recent weight loss , fat depletion and muscle wasting on exam. Nutrition consulted.   Code Status: full  Family Communication: patient   Disposition Plan: home on 10/20   Consultants:  [facility]  Oncology Dr Alen Blew  Procedures:  none  Antibiotics:  none   Objective: BP 109/63 (BP Location: Right Arm)   Pulse 71   Temp 97.8 F (36.6 C) (Oral)   Resp 16   Ht 5\' 8"  (1.727 m)   Wt 59.3 kg (130 lb 11.7 oz)   SpO2 100%   BMI 19.88 kg/m   Intake/Output Summary (Last 24  hours) at 01/25/17 0842 Last data filed at 01/24/17 2010  Gross per 24 hour  Intake              240 ml  Output              100 ml  Net              140 ml   Filed Weights   01/22/17 0608 01/23/17 0424 01/24/17 0433  Weight: 53.6 kg (118 lb 2.7 oz) 54.9 kg (121 lb 0.5 oz) 59.3 kg (130 lb 11.7 oz)      Exam: Patient is examined daily including today on 01/25/2017, exams remain the same as of yesterday except that has changed    General:  Improving, seems stronger, 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 oriented x3,   Cardiovascular: RRR  Respiratory: CTABL  Abdomen: Soft/ND/NT, positive BS  Musculoskeletal: No Edema  Neuro: more alert and interactive, oriented   Data Reviewed: Basic Metabolic Panel:  Recent Labs Lab 01/21/17 1949 01/22/17 0236 01/23/17 0043 01/24/17 0708 01/25/17 0623  NA 151* 148* 143 139 136  K 3.3* 3.4* 3.9 3.7 3.7  CL 113* 105 107 103 103  CO2 24 22 26 25 24   GLUCOSE 233* 192* 65 85 141*  BUN 102* 111* 100* 86* 77*  CREATININE 3.23* 3.58* 3.26* 2.83* 2.60*  CALCIUM 7.8* 9.0 8.9 8.7* 8.5*  MG  --  2.6*  --  1.9  --    Liver Function Tests:  Recent Labs Lab 01/18/17 1100 01/21/17 1949  AST 16 16  ALT 8 9*  ALKPHOS 83 82  BILITOT 0.5 0.5  PROT 6.9 5.0*  ALBUMIN 3.5 2.4*   No results for input(s): LIPASE, AMYLASE in the last 168 hours. No results for input(s): AMMONIA in the last 168 hours. CBC:  Recent Labs Lab 01/18/17 1100 01/21/17 1934 01/22/17 0236  WBC 11.2* 8.6 9.5  NEUTROABS 9.4* 7.0  --   HGB 10.9* 8.3* 9.3*  HCT 34.7* 25.0* 28.5*  MCV 96.5 94.0 94.1  PLT 263.0 211 237   Cardiac Enzymes:   No results for input(s): CKTOTAL, CKMB, CKMBINDEX, TROPONINI in the last 168 hours. BNP (last 3 results)  Recent Labs  01/21/17 2348  BNP 106.5*    ProBNP (last 3 results) No results for input(s): PROBNP in the last 8760 hours.  CBG:  Recent Labs Lab 01/24/17 1033 01/24/17 1157 01/24/17 1708 01/24/17 2223 01/25/17 0718  GLUCAP 108* 114* 138* 82 126*    Recent Results (from the past 240 hour(s))  Culture, blood (Routine X 2) w Reflex to ID Panel     Status: None (Preliminary result)   Collection Time: 01/21/17 11:48 PM  Result Value Ref Range Status   Specimen Description BLOOD RIGHT HAND  Final   Special Requests  IN PEDIATRIC BOTTLE Blood Culture adequate volume  Final   Culture   Final    NO GROWTH 2 DAYS Performed at [facility], [place] [address] [place], [place] [address]    Report Status PENDING  Incomplete  Culture, blood (Routine X 2) w Reflex to ID Panel     Status: None (Preliminary result)   Collection Time: 01/22/17  2:36 AM  Result Value Ref Range Status   Specimen Description BLOOD RIGHT HAND  Final   Special Requests IN PEDIATRIC BOTTLE Blood Culture adequate volume  Final   Culture   Final    NO GROWTH 2 DAYS Performed at [facility], [address] [place], [place] [address]    Report Status PENDING  Incomplete     Studies: Dg Esophagus  Result Date: 01/24/2017 CLINICAL DATA:  76 year old male with achalasia. Vomiting. Decreased p.o. intake. Dehydration. EXAM: ESOPHOGRAM/BARIUM SWALLOW TECHNIQUE: Single contrast examination was performed using  thin barium. FLUOROSCOPY TIME:  Fluoroscopy Time:  1 minutes 18 seconds Radiation Exposure Index (if provided by the fluoroscopic device): 10.60mG y Number of Acquired Spot Images: 0 COMPARISON:  CT Abdomen and Pelvis 01/23/2017. Esophagram 04/12/2016. FINDINGS: Due to limited mobility, the study was performed with the patient in a left lateral oblique position on the fluoroscopy table with the head of the table inclined to 30 degrees. p.o. ingestion of barium demonstrated accumulation of contrast within a severely dilated esophagus. Contrast was seen to be diffused into preexisting fluid within the esophagus. Contrasted accumulate in the distal esophagus at the level of the diaphragm. No esophageal motility and no transit of contrast through the gastroesophageal junction was observed with fluoroscopy during the first 3 minutes of the exam. A overhead 18 minutes delayed image demonstrated persistent contrast within the dilated distal esophagus, and no evidence of contrast transit into the stomach. However, yesterday oral contrast  administered as part of the CT Abdomen and Pelvis is visible throughout the transverse colon and at both flexures. IMPRESSION: Chronic esophageal achalasia. Severely dilated and fluid-filled esophagus with no contrast transit into the stomach over a period of 18 minutes. Electronically Signed   By: Genevie Ann M.D.   On: 01/24/2017 13:11    Scheduled Meds: . atorvastatin  10 mg Oral QHS  . enoxaparin (LOVENOX) injection  30 mg Subcutaneous Daily  . feeding supplement (ENSURE ENLIVE)  237 mL Oral BID BM  . feeding supplement (PRO-STAT SUGAR FREE 64)  30 mL Oral BID  . ferrous sulfate  325 mg Oral Q breakfast  . insulin aspart  0-9 Units Subcutaneous TID WC  . multivitamin with minerals  1 tablet Oral Daily  . pantoprazole  40 mg Oral BID    Continuous Infusions: . sodium chloride Stopped (01/22/17 0215)     Time spent: 60mins I have personally reviewed and interpreted on  01/25/2017 daily labs, tele strips, imagings as discussed above under date review session and assessment and plans.  I reviewed all nursing notes, pharmacy notes, consultant notes,  vitals, pertinent old records  I have discussed plan of care as described above with RN , patient  on 01/25/2017   Saketh Daubert MD, PhD  [facility] Pager [phone]. If 7PM-7AM, please contact night-coverage at [email or web address], password [facility] 01/25/2017, 8:42 AM  LOS: 3 days

## 2017-01-25 NOTE — Progress Notes (Addendum)
SLP Cancellation Note  Patient Details Name: THUNDER BRIDGEWATER MRN: [id number] DOB: [birth year]   Cancelled treatment:       Reason Eval/Treat Not Completed: Other (comment) (order for swallow eval rec, note esoph results/GI following, spoke to hospitalist who reports pt education desired, will follow up after GI)  After discussion with Alonza Bogus with GI - pt to dc home on full liquid diet and follow up for pneumatic dilatation as OP.       Luanna Salk, [place] [facility] SLP [phone]

## 2017-01-25 NOTE — Progress Notes (Signed)
[facility] Progress Note  CC:  Dysphagia/achalasia  Subjective:  BE shows achalasia.  Patient was tolerating full liquids at home and actually got pureed foods last night.  No new complaints.  Objective:  Vital signs in last 24 hours: Temp:  [97.8 F (36.6 C)-98.4 F (36.9 C)] 97.8 F (36.6 C) (10/19 0550) Pulse Rate:  [71-80] 71 (10/19 0550) Resp:  [16-23] 16 (10/19 0550) BP: (109-119)/(60-65) 109/63 (10/19 0550) SpO2:  [100 %] 100 % (10/19 0550) Last BM Date: 01/24/17 General:  Alert, Well-developed, in NAD Heart:  Regular rate and rhythm; no murmurs Pulm:  CTAB.  No increased WOB. Abdomen:  Soft, non-distended.  BS present.  Non-tender. Extremities:  Without edema. Neurologic:  Alert and oriented x 4;  grossly normal neurologically. Psych:  Alert and cooperative. Normal mood and affect.  BMET  Recent Labs  01/23/17 0043 01/24/17 0708 01/25/17 0623  NA 143 139 136  K 3.9 3.7 3.7  CL 107 103 103  CO2 26 25 24   GLUCOSE 65 85 141*  BUN 100* 86* 77*  CREATININE 3.26* 2.83* 2.60*  CALCIUM 8.9 8.7* 8.5*   Ct Abdomen Pelvis Wo Contrast  Result Date: 01/24/2017 CLINICAL DATA:  Advanced prostate cancer, post systemic therapy. Assess treatment response. Oral contrast only. EXAM: CT CHEST, ABDOMEN AND PELVIS WITHOUT CONTRAST TECHNIQUE: Multidetector CT imaging of the chest, abdomen and pelvis was performed following the standard protocol without IV contrast. COMPARISON:  CT abdomen and pelvis from 11/07/2015, PET CT 11/17/2015 FINDINGS: CT CHEST FINDINGS Cardiovascular: The heart size is normal with coronary arteriosclerosis along the LAD, RCA and circumflex. No aortic aneurysm. Minimal aortic atherosclerosis at the arch and along the descending portion. The unenhanced pulmonary vasculature is unremarkable. Mediastinum/Nodes: Calcified right lower paratracheal lymph nodes are noted without adenopathy. No axillary, mediastinal or hilar lymphadenopathy given  limitations of a noncontrast study. Marked contrast distention of the esophagus tapering at the GE junction consistent with achalasia. Contrast is noted within the stomach suggesting patency of the GE junction. Lungs/Pleura: 2.1 cm thin walled cavitation is relatively stable in appearance relative to PET-CT noted within the right lower lobe. No effusion or pneumothorax. No pneumonic consolidation. Scarring is seen at the left lung apex. Musculoskeletal: No chest wall mass or suspicious bone lesions identified. CT ABDOMEN PELVIS FINDINGS Hepatobiliary: No focal liver abnormality is seen. No gallstones, gallbladder wall thickening, or biliary dilatation. The gallbladder appears contracted. Pancreas: No ductal dilatation. Pancreatic tail calcification may reflect stigmata of chronic pancreatitis. Spleen: Normal size without mass. Adrenals/Urinary Tract: Chronic thickening of the left adrenal gland without focal mass. The right adrenal gland is unremarkable. Bilateral renal cysts are noted without evidence of obstructive uropathy. No hydroureteronephrosis. The urinary bladder is physiologically distended. Stomach/Bowel: Contrast reaches the rectum. There is colonic diverticulosis without acute diverticulitis. Normal small bowel rotation without obstruction or inflammation. Normal appendix. Vascular/Lymphatic: Mild aortoiliac atherosclerosis without aneurysm. No lymphadenopathy. Reproductive: Enlarged prostate with coarse peripheral zone calcifications are again identified. Presacral, perirectal homogeneous soft tissue masses are again visualized which appear to have increased in size from prior PET-CT, the more anterior and left-sided mass measuring 5 x 2.1 cm, series 2, image 106 versus 2.5 x 2.8 cm previously and the more midline posterior presacral mass is estimated at 3.2 x 4.3 cm versus 2.9 x 2.6 cm, as measured on series 2, image 109. Other: No free air nor free fluid. Musculoskeletal: Subchondral cystic change of  the right acetabulum, left iliac bone adjacent to the  SI joint with patchy osteopenic appearance of the iliac bone. No definite osteoblastic disease. IMPRESSION: 1. Stable 2.1 cm thin walled cavitation in the right lower lobe. 2. Marked contrast distention of the esophagus consistent with achalasia. 3. Interval increase in size of presacral soft tissue masses now estimated at 5 x 2.1 cm versus 2.5 x 2.8 cm and 3.2 x 4.3 cm versus 2.9 x 2.6 cm despite reported systemic therapy for recurrence of prostate cancer. 4. Stable thickening of the left adrenal gland with right-sided water attenuating renal cysts. Electronically Signed   By: Ashley Royalty M.D.   On: 01/24/2017 01:03   Ct Chest Wo Contrast  Result Date: 01/24/2017 : CLINICAL DATA: Advanced prostate cancer, post systemic therapy. Assess treatment response. Oral contrast only. EXAM: CT CHEST without IV contrast TECHNIQUE: Multidetector CT imaging of the chest, abdomen and pelvis was performed following the standard protocol without IV contrast. COMPARISON: CT abdomen and pelvis from 11/07/2015, PET CT 11/17/2015 FINDINGS: This report was included as part of the abdomen and pelvis CT dictation. No change to the body of the report. Cardiovascular: The heart size is normal with coronary arteriosclerosis along the LAD, RCA and circumflex. No aortic aneurysm. Minimal aortic atherosclerosis at the arch and along the descending portion. The unenhanced pulmonary vasculature is unremarkable. Mediastinum/Nodes: Calcified right lower paratracheal lymph nodes are noted without adenopathy. No axillary, mediastinal or hilar lymphadenopathy given limitations of a noncontrast study. Marked contrast distention of the esophagus tapering at the GE junction consistent with achalasia. Contrast is noted within the stomach suggesting patency of the GE junction. Lungs/Pleura: 2.1 cm thin walled cavitation is relatively stable in appearance relative to PET-CT noted within the right  lower lobe. No effusion or pneumothorax. No pneumonic consolidation. Scarring is seen at the left lung apex. Musculoskeletal: No chest wall mass or suspicious bone lesions identified. IMPRESSION: Stable 2.1 cm thin walled cavitation in the right lower lobe. Dilated contrast distention of the esophagus with tapering at the GE junction consistent with an achalasia. Aortic Atherosclerosis (ICD10-I70.0). Electronically Signed   By: Ashley Royalty M.D.   On: 01/24/2017 01:55   Dg Esophagus  Result Date: 01/24/2017 CLINICAL DATA:  76 year old male with achalasia. Vomiting. Decreased p.o. intake. Dehydration. EXAM: ESOPHOGRAM/BARIUM SWALLOW TECHNIQUE: Single contrast examination was performed using  thin barium. FLUOROSCOPY TIME:  Fluoroscopy Time:  1 minutes 18 seconds Radiation Exposure Index (if provided by the fluoroscopic device): 10.60mG y Number of Acquired Spot Images: 0 COMPARISON:  CT Abdomen and Pelvis 01/23/2017. Esophagram 04/12/2016. FINDINGS: Due to limited mobility, the study was performed with the patient in a left lateral oblique position on the fluoroscopy table with the head of the table inclined to 30 degrees. p.o. ingestion of barium demonstrated accumulation of contrast within a severely dilated esophagus. Contrast was seen to be diffused into preexisting fluid within the esophagus. Contrasted accumulate in the distal esophagus at the level of the diaphragm. No esophageal motility and no transit of contrast through the gastroesophageal junction was observed with fluoroscopy during the first 3 minutes of the exam. A overhead 18 minutes delayed image demonstrated persistent contrast within the dilated distal esophagus, and no evidence of contrast transit into the stomach. However, yesterday oral contrast administered as part of the CT Abdomen and Pelvis is visible throughout the transverse colon and at both flexures. IMPRESSION: Chronic esophageal achalasia. Severely dilated and fluid-filled esophagus  with no contrast transit into the stomach over a period of 18 minutes. Electronically Signed   By: Lemmie Evens  [place] Crane M.D.   On: 01/24/2017 13:11   Assessment / Plan: #31 76 year old African-American male with long-standing esophageal achalasia who has undergone balloon dilation and Botox injections twice this year, last was done less than a month ago. At that time he did not have any food or fluid in his esophagus and no obstruction.  Patient admitted this time with progressive weakness, failure to thrive and dehydration.  Barium esophagram shows achalasia.   #2 acute on chronic renal insufficiency #3 normocytic anemia #4 protein calorie malnutrition #5 Metastatic prostate cancer  *Patient has an appt with Dr. Silverio Decamp on 11/29 at 8:30 am to discuss pneumatic dilation of the LES.  Until then he should remain on [place]!!  He was tolerating these prior to admission.  Feeding tube was discussed but patient declined.  [place] for discharge from GI standpoint.   LOS: 3 days   ZEHR, JESSICA D.  01/25/2017, 9:20 AM  Pager number [phone]  ________________________________________________________________________  Velora Heckler GI MD note:  I personally examined the patient, reviewed the data and agree with the assessment and plan described above.  I cannot offer further achalasia treatment.  He is scheduled to see one of my partners next month to discuss pneumatic dilation, he has no showed for the same appointment several months ago.  I offered and recommended that he reconsider g-tube feeding but he is not at all interested.  Ok to d/c home and he should not advance past full liquid diet and should remain upright for 1-2 hours after any oral intake (get gravity working in his favor).    Please call or page with any further questions or concerns.    Owens Loffler, MD [facility] Pager [phone]

## 2017-01-26 LAB — BASIC METABOLIC PANEL
ANION GAP: 12 (ref 5–15)
BUN: 66 mg/dL — AB (ref 6–20)
CHLORIDE: 104 mmol/L (ref 101–111)
CO2: 23 mmol/L (ref 22–32)
Calcium: 8.7 mg/dL — ABNORMAL LOW (ref 8.9–10.3)
Creatinine, Ser: 2.52 mg/dL — ABNORMAL HIGH (ref 0.61–1.24)
GFR calc Af Amer: 27 mL/min — ABNORMAL LOW (ref >60)
GFR, EST NON AFRICAN AMERICAN: 23 mL/min — AB (ref >60)
GLUCOSE: 107 mg/dL — AB (ref 65–99)
POTASSIUM: 3.6 mmol/L (ref 3.5–5.1)
Sodium: 139 mmol/L (ref 135–145)

## 2017-01-26 LAB — GLUCOSE, CAPILLARY
GLUCOSE-CAPILLARY: 179 mg/dL — AB (ref 65–99)
Glucose-Capillary: 102 mg/dL — ABNORMAL HIGH (ref 65–99)
Glucose-Capillary: 111 mg/dL — ABNORMAL HIGH (ref 65–99)

## 2017-01-26 LAB — MAGNESIUM: Magnesium: 1.8 mg/dL (ref 1.7–2.4)

## 2017-01-26 MED ORDER — ENSURE ENLIVE PO LIQD: 237.0000 mL | Freq: Two times a day (BID) | ORAL | 12 refills | Status: AC

## 2017-01-26 MED ORDER — POTASSIUM CHLORIDE 20 MEQ/15ML (10%) PO SOLN
40.0000 meq | Freq: Once | ORAL | Status: AC
  Administered 2017-01-26: 40 meq via ORAL
  Filled 2017-01-26: qty 30

## 2017-01-26 MED ORDER — METOPROLOL TARTRATE 25 MG PO TABS: 12.5000 mg | ORAL_TABLET | Freq: Two times a day (BID) | ORAL | 0 refills | Status: DC

## 2017-01-26 NOTE — Progress Notes (Signed)
Pt leaving at this time with his spouse. No c/o.

## 2017-01-26 NOTE — Progress Notes (Signed)
Pt leaving this afternoon with his spouse. Discharge instructions given/explained with pt verbalizing understanding. Followup appt noted. Pt given medication from pharmacy. Pt without c/o.

## 2017-01-26 NOTE — Discharge Summary (Signed)
Discharge Summary  [name] PTW:[id number] DOB: 04-Apr-1941  PCP: Golden Circle, FNP  Admit date: 01/21/2017 Discharge date: 01/26/2017  Time spent: >90mins, more than 50% time spent on coordination of care  Recommendations for Outpatient Follow-up:  1. F/u with PMD within a week  for hospital discharge follow up, repeat cbc/bmp at follow up 2. F/u with gi for esophageal achalasia, remain on full liquid diet until follow up with gi. GI to decide on diet advancement 3. F/u with oncology Dr Alen Blew for prostate cancer 4. F/u with pulmonology for cavitary lung lesion right upper lobe 5. F/u with endocrinology for diabetes control, he tends to have hypoglycemia due to poor oral intake.  Discharge Diagnoses:  Active Hospital Problems   Diagnosis Date Noted  . Generalized weakness 01/29/2016  . Hypotension 01/21/2017  . Chronic diastolic CHF (congestive heart failure) ([place]) 01/21/2017  . Hypernatremia 02/19/2016  . Achalasia   . Anemia 02/15/2016  . Dehydration   . Protein-calorie malnutrition, severe 01/09/2016  . Acute renal failure superimposed on stage 3 chronic kidney disease ([place]) 01/08/2016  . Hypokalemia 01/08/2016  . Type II diabetes mellitus with renal manifestations ([place]) 09/15/2006  . Essential hypertension 09/13/2006  . Hyperlipidemia associated with type 2 diabetes mellitus ([place]) 09/13/2006  . PROSTATE CANCER, HX OF 09/13/2006    Resolved Hospital Problems   Diagnosis Date Noted Date Resolved  No resolved problems to display.    Discharge Condition: stable  Diet recommendation: full liquid diet  Filed Weights   01/23/17 0424 01/24/17 0433 01/26/17 0529  Weight: 54.9 kg (121 lb 0.5 oz) 59.3 kg (130 lb 11.7 oz) 55.3 kg (121 lb 14.6 oz)    History of present illness:  PCP: Golden Circle, FNP   Patient coming from:  The patient is coming from home.  At baseline, pt is partially dependent for most of ADL.   Chief Complaint: generalized  weakness  HPI: [name] is a 76 y.o. male with medical history significant of Hypertension, hyperlipidemia, diabetes mellitus, GERD, prostate cancer, CKD-3, anemia, esophageal achalasia (s/p of Botox injection), GI bleeding, dCHF, who presents with generalized weakness.  Per pt's wife,  Has been feeling weak in the past 2 weeks, which has been progressively getting worse. Patient does not have unilateral numbness, tingling in extremities. No facial droop or slurred speech. He also has decreased appetite and oral intake. He has hx of esophageal achalasia, he underwent Botox injection recentl. Currently patient does not have difficulty swallowing or choking on eating food. Patient does not have nausea, vomiting, abdominal pain. He states that has loose stool in the past 2 days, but he is taking Colace.  He had slight cough last week and had a chest x-ray a few days ago that was negative for pneumonia. Patient does not have chest pain, SOB, fever or chills. No symptoms of UTI. Pt was found to have hypotension with blood pressure 86/53 which improved to 129/79 after 2 50 mL of normal saline bolus per EMS.  ED Course: pt was found to have WBC 8.6, sodium 151, potassium is 3.3, worsening renal function, no tachycardia, has tachypnea, oxygen saturation section 96% on room air. Pending UA. Patient is placed on telemetry bed for observation   Hospital Course:  Principal Problem:   Generalized weakness Active Problems:   Type II diabetes mellitus with renal manifestations ([place])   Hyperlipidemia associated with type 2 diabetes mellitus (HCC)   Essential hypertension   PROSTATE CANCER, HX OF  Hypokalemia   Acute renal failure superimposed on stage 3 chronic kidney disease (HCC)   Protein-calorie malnutrition, severe   Dehydration   Anemia   Achalasia   Hypernatremia   Hypotension   Chronic diastolic CHF (congestive heart failure) (HCC)   Anorexia/weight  loss/dehydration/weakness/hypotensnion/FTT: -constellation of symptom likely from dehydration from esophageal achalasia, -with h/o esophageal achalasia s/p botox infection a few weeks ago,  -patient also has a h/o metastatic prostate cancer, he was on zytiga and prednisione, but It seems has discontinued due to fatigue and anorexia, he is recently started on xtandi, he has been off steroids for several months. his am cortisol level is 33. -I have consulted oncology Dr Alen Blew to evaluate patient. Not sure cancer progression or meds side effect could contribute to above  symptoms , psa elevated at 109.  ct chest /ab/pel ( no contrast due to renal impairment)" Interval increase in size of presacral soft tissue masses now estimated at 5 x 2.1 cm versus 2.5 x 2.8 cm and 3.2 x 4.3 cm versus 2.9 x 2.6 cm despite reported systemic therapy for recurrence of prostate cancer." -there is also significant elevated bun, bun seems has progressively worsened over the last year,  From normal bun in march 2018 to now above 100, this could cause anorexia n/v as well. - tsh 4. -overall has improved with hydration. He is cleared to discharge on on full liquid diet only, he need to follow up with GI and oncology closely.   Stable 2.1 cm thin walled cavitation in the right lower lobe on CT chest Patient is asymptomatic from this, no cough, no chest pain, no fever Outpatient pulmonology follow up.  Hypernatremia: likely from dehydration -Sodium 151 on presentation -Normalized with hydration   Hypokalemia: replaced, mag unremarkable  NSVT; 4 runs on tele, does not appear symptomatic Betablocker held due to hypotension Keep k>4, mag>2 Low dose lopressor started at discharge ( home meds atenolol discontinued due to renal impariment)  AKI on CKD IV: bun/cr 111/3.58 on presentation -uremia? Bun 111 on presentation with lethargy -ua no blood, no protein, no wbc, no infection -ct ab/pel without contrast  ordered -Home meds lasix held since admission and discontinued at discharge -renal dosing meds, avoid nephrotoxin.  -improving with hydration, now back to baseline bun/cr 66/2.5 at discharge.  noninsulin dependent dm2,  a1c 7.2 Am blood sugar 65, hold all home oral meds On ssi and hypoglycemia protocol. Required d5half saline briefly Eating more, improved Home meds actos discontinued, resumed glipizide and tradjenta (dpp4inhibitor)  H/o HLD, last ldl 63, continue home meds lipitor at 10mg  daily  HTN; presented with hypotension Home meds atenolol and lasix held, s/p hydration  discharged on low dose lopressor   atenolol not a good choice in the setting of renal impairment.  Chronic diastolic chf: presented with  dehydration, lasix held, received hydration Due to poor oral intake, patient is prove to dehydration. Lasix discontinued.  Chronic normocytic anemia: no sign of bleeding. Likely component of anemia of chronic disease. He is also on chronic iron supplement, which is continued.  Severe malnutrition with recent weight loss , fat depletion and muscle wasting on exam. Nutrition consulted.   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 oriented x3,   Cardiovascular: RRR  Respiratory: CTABL  Abdomen: Soft/ND/NT, positive BS  Musculoskeletal: No Edema  Neuro: more alert and interactive, oriented    Discharge Instructions You were cared for by a hospitalist during your hospital stay. If you have any questions about your  discharge medications or the care you received while you were in the hospital after you are discharged, you can call the unit and asked to speak with the hospitalist on call if the hospitalist that took care of you is not available. Once you are discharged, your primary care physician will handle any further medical issues. Please note that NO REFILLS for any discharge medications will be authorized once you are discharged, as it is imperative that you return to your primary care physician (or establish a relationship with a primary care physician if you do not have one) for your aftercare needs so that they can reassess your need for medications and monitor your lab values.  Discharge Instructions    Diet general    Complete by:  As directed    full liquid diet and should remain upright for 1-2 hours after any oral intake (get gravity working in his favor).   Face-to-face encounter (required for Medicare/Medicaid patients)    Complete by:  As directed    I Mannie Ohlin certify that this patient is under my care and that I, or a nurse practitioner or physician's assistant working with me, had a face-to-face encounter that meets the physician face-to-face encounter requirements with this patient on 01/25/2017. The encounter with the patient was in whole, or in part for the following medical condition(s) which is the primary reason for home health care (List medical condition): FTT   The encounter with the patient was in whole, or in part, for the following medical condition, which is the primary reason for home health care:  FTT   I certify that, based on my findings, the following services are medically necessary home health services:   Nursing Physical therapy     Reason for Medically Necessary Home Health Services:  Skilled Nursing- Change/Decline in Patient Status   My clinical findings support the need for the above services:  Shortness of breath with activity   Further, I certify that my clinical  findings support that this patient is homebound due to:  Unable to leave home safely without assistance   Home Health    Complete by:  As directed    To provide the following care/treatments:   PT RN     Increase activity slowly    Complete by:  As directed      Allergies as of 01/26/2017   No Known Allergies     Medication List    STOP taking these medications   atenolol 25 MG tablet Commonly known as:  TENORMIN   furosemide 80 MG tablet Commonly known as:  LASIX   pioglitazone 15 MG tablet Commonly known as:  ACTOS   XTANDI 40 MG capsule Generic drug:  enzalutamide     TAKE these medications   ACCU-CHEK FASTCLIX LANCETS Misc Use to check blood sugars twice daily E11.65   acetaminophen 650 MG CR tablet Commonly known as:  TYLENOL Take 650 mg by mouth  every 4 (four) hours as needed for pain.   atorvastatin 10 MG tablet Commonly known as:  LIPITOR Take 1 tablet (10 mg total) by mouth at bedtime.   docusate sodium 100 MG capsule Commonly known as:  COLACE TAKE ONE CAPSULE BY MOUTH TWICE A DAY FOR CONSTIPATION What changed:  See the new instructions.   feeding supplement (ENSURE ENLIVE) Liqd Take 237 mLs by mouth 2 (two) times daily between meals.   feeding supplement (PRO-STAT SUGAR FREE 64) Liqd Take 30 mLs by mouth 2 (two) times daily. What changed:  when to take this   ferrous sulfate 325 (65 FE) MG tablet TAKE 1 TABLET BY MOUTH EVERY DAY FOR SUPPLEMENT   glipiZIDE 5 MG 24 hr tablet Commonly known as:  GLUCOTROL XL Take 1 tablet (5 mg total) by mouth daily with breakfast.   glucose blood test strip Use as instructed   metoprolol tartrate 25 MG tablet Commonly known as:  LOPRESSOR Take 0.5 tablets (12.5 mg total) by mouth 2 (two) times daily.   multivitamin with minerals Tabs tablet Take 1 tablet by mouth daily.   ondansetron 4 MG disintegrating tablet Commonly known as:  ZOFRAN-ODT Take 1 tablet (4 mg total) by mouth every 8 (eight) hours as  needed for nausea or vomiting.   pantoprazole 40 MG tablet Commonly known as:  PROTONIX Take 1 tablet (40 mg total) by mouth 2 (two) times daily.   TRADJENTA 5 MG Tabs tablet Generic drug:  linagliptin TAKE 1 TABLET (5 MG TOTAL) BY MOUTH DAILY.      No Known Allergies Follow-up Information    Mauri Pole, MD Follow up on 03/07/2017.   Specialty:  Gastroenterology Why:  8:30 am Contact information: [place] [id number] [phone]        Wyatt Portela, MD Follow up in 3 week(s).   Specialty:  Oncology Why:  prostate cancer Contact information: [place] [id number] [phone]        Golden Circle, FNP Follow up in 1 week(s).   Specialties:  Family Medicine, Infectious Diseases Why:  hospital discharge follow up, repeat cbc/bmp at follow up Contact information: [place] [address] [phone]        [facility] Follow up in 3 week(s).   Specialty:  Pulmonology Why:  incidental CT chest finding "Stable 2.1 cm thin walled cavitation in the right lower lobe." Contact information: [place] [phone]           The results of significant diagnostics from this hospitalization (including imaging, microbiology, ancillary and laboratory) are listed below for reference.    Significant Diagnostic Studies: Ct Abdomen Pelvis Wo Contrast  Result Date: 01/24/2017 CLINICAL DATA:  Advanced prostate cancer, post systemic therapy. Assess treatment response. Oral contrast only. EXAM: CT CHEST, ABDOMEN AND PELVIS WITHOUT CONTRAST TECHNIQUE: Multidetector CT imaging of the chest, abdomen and pelvis was performed following the standard protocol without IV contrast. COMPARISON:  CT abdomen and pelvis from 11/07/2015, PET CT 11/17/2015 FINDINGS: CT CHEST FINDINGS Cardiovascular: The heart size is normal with coronary arteriosclerosis along the LAD,  RCA and circumflex. No aortic aneurysm. Minimal aortic atherosclerosis at the arch and along the descending portion. The unenhanced pulmonary vasculature is unremarkable. Mediastinum/Nodes: Calcified right lower paratracheal lymph nodes are noted without adenopathy. No axillary, mediastinal or hilar lymphadenopathy given limitations of a noncontrast study. Marked contrast distention of the esophagus tapering at  the GE junction consistent with achalasia. Contrast is noted within the stomach suggesting patency of the GE junction. Lungs/Pleura: 2.1 cm thin walled cavitation is relatively stable in appearance relative to PET-CT noted within the right lower lobe. No effusion or pneumothorax. No pneumonic consolidation. Scarring is seen at the left lung apex. Musculoskeletal: No chest wall mass or suspicious bone lesions identified. CT ABDOMEN PELVIS FINDINGS Hepatobiliary: No focal liver abnormality is seen. No gallstones, gallbladder wall thickening, or biliary dilatation. The gallbladder appears contracted. Pancreas: No ductal dilatation. Pancreatic tail calcification may reflect stigmata of chronic pancreatitis. Spleen: Normal size without mass. Adrenals/Urinary Tract: Chronic thickening of the left adrenal gland without focal mass. The right adrenal gland is unremarkable. Bilateral renal cysts are noted without evidence of obstructive uropathy. No hydroureteronephrosis. The urinary bladder is physiologically distended. Stomach/Bowel: Contrast reaches the rectum. There is colonic diverticulosis without acute diverticulitis. Normal small bowel rotation without obstruction or inflammation. Normal appendix. Vascular/Lymphatic: Mild aortoiliac atherosclerosis without aneurysm. No lymphadenopathy. Reproductive: Enlarged prostate with coarse peripheral zone calcifications are again identified. Presacral, perirectal homogeneous soft tissue masses are again visualized which appear to have increased in size from prior PET-CT,  the more anterior and left-sided mass measuring 5 x 2.1 cm, series 2, image 106 versus 2.5 x 2.8 cm previously and the more midline posterior presacral mass is estimated at 3.2 x 4.3 cm versus 2.9 x 2.6 cm, as measured on series 2, image 109. Other: No free air nor free fluid. Musculoskeletal: Subchondral cystic change of the right acetabulum, left iliac bone adjacent to the SI joint with patchy osteopenic appearance of the iliac bone. No definite osteoblastic disease. IMPRESSION: 1. Stable 2.1 cm thin walled cavitation in the right lower lobe. 2. Marked contrast distention of the esophagus consistent with achalasia. 3. Interval increase in size of presacral soft tissue masses now estimated at 5 x 2.1 cm versus 2.5 x 2.8 cm and 3.2 x 4.3 cm versus 2.9 x 2.6 cm despite reported systemic therapy for recurrence of prostate cancer. 4. Stable thickening of the left adrenal gland with right-sided water attenuating renal cysts. Electronically Signed   By: Ashley Royalty M.D.   On: 01/24/2017 01:03   Dg Chest 2 View  Result Date: 01/18/2017 CLINICAL DATA:  Cough, weakness, nausea for 3 day EXAM: CHEST  2 VIEW COMPARISON:  02/22/2016 FINDINGS: Normal heart size. Clear lungs. No pneumothorax or pleural effusion. No acute bony deformity. IMPRESSION: No active cardiopulmonary disease. Electronically Signed   By: Marybelle Killings M.D.   On: 01/18/2017 10:59   Ct Chest Wo Contrast  Result Date: 01/24/2017 : CLINICAL DATA: Advanced prostate cancer, post systemic therapy. Assess treatment response. Oral contrast only. EXAM: CT CHEST without IV contrast TECHNIQUE: Multidetector CT imaging of the chest, abdomen and pelvis was performed following the standard protocol without IV contrast. COMPARISON: CT abdomen and pelvis from 11/07/2015, PET CT 11/17/2015 FINDINGS: This report was included as part of the abdomen and pelvis CT dictation. No change to the body of the report. Cardiovascular: The heart size is normal with coronary  arteriosclerosis along the LAD, RCA and circumflex. No aortic aneurysm. Minimal aortic atherosclerosis at the arch and along the descending portion. The unenhanced pulmonary vasculature is unremarkable. Mediastinum/Nodes: Calcified right lower paratracheal lymph nodes are noted without adenopathy. No axillary, mediastinal or hilar lymphadenopathy given limitations of a noncontrast study. Marked contrast distention of the esophagus tapering at the GE junction consistent with achalasia. Contrast is noted within the stomach suggesting patency of  the GE junction. Lungs/Pleura: 2.1 cm thin walled cavitation is relatively stable in appearance relative to PET-CT noted within the right lower lobe. No effusion or pneumothorax. No pneumonic consolidation. Scarring is seen at the left lung apex. Musculoskeletal: No chest wall mass or suspicious bone lesions identified. IMPRESSION: Stable 2.1 cm thin walled cavitation in the right lower lobe. Dilated contrast distention of the esophagus with tapering at the GE junction consistent with an achalasia. Aortic Atherosclerosis (ICD10-I70.0). Electronically Signed   By: Ashley Royalty M.D.   On: 01/24/2017 01:55   Dg Esophagus  Result Date: 01/24/2017 CLINICAL DATA:  76 year old male with achalasia. Vomiting. Decreased p.o. intake. Dehydration. EXAM: ESOPHOGRAM/BARIUM SWALLOW TECHNIQUE: Single contrast examination was performed using  thin barium. FLUOROSCOPY TIME:  Fluoroscopy Time:  1 minutes 18 seconds Radiation Exposure Index (if provided by the fluoroscopic device): 10.60mG y Number of Acquired Spot Images: 0 COMPARISON:  CT Abdomen and Pelvis 01/23/2017. Esophagram 04/12/2016. FINDINGS: Due to limited mobility, the study was performed with the patient in a left lateral oblique position on the fluoroscopy table with the head of the table inclined to 30 degrees. p.o. ingestion of barium demonstrated accumulation of contrast within a severely dilated esophagus. Contrast was seen  to be diffused into preexisting fluid within the esophagus. Contrasted accumulate in the distal esophagus at the level of the diaphragm. No esophageal motility and no transit of contrast through the gastroesophageal junction was observed with fluoroscopy during the first 3 minutes of the exam. A overhead 18 minutes delayed image demonstrated persistent contrast within the dilated distal esophagus, and no evidence of contrast transit into the stomach. However, yesterday oral contrast administered as part of the CT Abdomen and Pelvis is visible throughout the transverse colon and at both flexures. IMPRESSION: Chronic esophageal achalasia. Severely dilated and fluid-filled esophagus with no contrast transit into the stomach over a period of 18 minutes. Electronically Signed   By: Genevie Ann M.D.   On: 01/24/2017 13:11    Microbiology: Recent Results (from the past 240 hour(s))  Culture, blood (Routine X 2) w Reflex to ID Panel     Status: None (Preliminary result)   Collection Time: 01/21/17 11:48 PM  Result Value Ref Range Status   Specimen Description BLOOD RIGHT HAND  Final   Special Requests IN PEDIATRIC BOTTLE Blood Culture adequate volume  Final   Culture   Final    NO GROWTH 4 DAYS Performed at [facility], [place] [address] [place], [place] [address]    Report Status PENDING  Incomplete  Culture, blood (Routine X 2) w Reflex to ID Panel     Status: None (Preliminary result)   Collection Time: 01/22/17  2:36 AM  Result Value Ref Range Status   Specimen Description BLOOD RIGHT HAND  Final   Special Requests IN PEDIATRIC BOTTLE Blood Culture adequate volume  Final   Culture   Final    NO GROWTH 4 DAYS Performed at [facility], [place] [address] [place], [place] [address]    Report Status PENDING  Incomplete     Labs: Basic Metabolic Panel:  Recent Labs Lab 01/22/17 0236 01/23/17 0043 01/24/17 0708 01/25/17 0623 01/26/17 0654  NA 148* 143 139 136 139  K 3.4* 3.9 3.7 3.7  3.6  CL 105 107 103 103 104  CO2 22 26 25 24 23   GLUCOSE 192* 65 85 141* 107*  BUN 111* 100* 86* 77* 66*  CREATININE 3.58* 3.26* 2.83* 2.60* 2.52*  CALCIUM 9.0  8.9 8.7* 8.5* 8.7*  MG 2.6*  --  1.9  --  1.8   Liver Function Tests:  Recent Labs Lab 01/21/17 1949  AST 16  ALT 9*  ALKPHOS 82  BILITOT 0.5  PROT 5.0*  ALBUMIN 2.4*   No results for input(s): LIPASE, AMYLASE in the last 168 hours. No results for input(s): AMMONIA in the last 168 hours. CBC:  Recent Labs Lab 01/21/17 1934 01/22/17 0236  WBC 8.6 9.5  NEUTROABS 7.0  --   HGB 8.3* 9.3*  HCT 25.0* 28.5*  MCV 94.0 94.1  PLT 211 237   Cardiac Enzymes: No results for input(s): CKTOTAL, CKMB, CKMBINDEX, TROPONINI in the last 168 hours. BNP: BNP (last 3 results)  Recent Labs  01/21/17 2348  BNP 106.5*    ProBNP (last 3 results) No results for input(s): PROBNP in the last 8760 hours.  CBG:  Recent Labs Lab 01/25/17 1118 01/25/17 1738 01/25/17 2153 01/26/17 0749 01/26/17 1138  GLUCAP 89 158* 84 102* 179*       Signed:  Aveion Nguyen MD, PhD  [facility] 01/26/2017, 1:08 PM

## 2017-01-26 NOTE — Care Management Note (Signed)
Case Management Note  Patient Details  Name: [name] MRN: [id number] Date of Birth: March 04, 1941  Subjective/Objective:     Generalized weakness, Type II DM, anorexia, weight loss               Action/Plan: Discharge Planning: NCM spoke to pt and requested NCM call wife, Otho Perl. Contacted wife [place] via phone. Offered choice for HH/list in room. States pt had [facility] in the past. Contacted [facility] with referral. Pt has RW, cane and bedside commode at home.   PCP  Mauricio Po D MD  Expected Discharge Date:  01/26/17               Expected Discharge Plan:  [place]  In-House Referral:  NA  Discharge planning Services  CM Consult  Post Acute Care Choice:  Home Health Choice offered to:  Spouse  DME Arranged:  N/A DME Agency:  NA  HH Arranged:  PT, RN [place] Agency:  [facility] ([facility])  Status of Service:  Completed, signed off  If discussed at [employer], dates discussed:    Additional Comments:  Erenest Rasher, RN 01/26/2017, 1:30 PM

## 2017-01-27 DIAGNOSIS — I130 Hypertensive heart and chronic kidney disease with heart failure and stage 1 through stage 4 chronic kidney disease, or unspecified chronic kidney disease: Secondary | ICD-10-CM | POA: Diagnosis not present

## 2017-01-27 DIAGNOSIS — I5032 Chronic diastolic (congestive) heart failure: Secondary | ICD-10-CM | POA: Diagnosis not present

## 2017-01-27 DIAGNOSIS — E43 Unspecified severe protein-calorie malnutrition: Secondary | ICD-10-CM | POA: Diagnosis not present

## 2017-01-27 DIAGNOSIS — E1122 Type 2 diabetes mellitus with diabetic chronic kidney disease: Secondary | ICD-10-CM | POA: Diagnosis not present

## 2017-01-27 DIAGNOSIS — K220 Achalasia of cardia: Secondary | ICD-10-CM | POA: Diagnosis not present

## 2017-01-27 DIAGNOSIS — N184 Chronic kidney disease, stage 4 (severe): Secondary | ICD-10-CM | POA: Diagnosis not present

## 2017-01-27 LAB — CULTURE, BLOOD (ROUTINE X 2)
CULTURE: NO GROWTH
CULTURE: NO GROWTH
Special Requests: ADEQUATE
Special Requests: ADEQUATE

## 2017-01-28 ENCOUNTER — Telehealth: Payer: Self-pay | Admitting: *Deleted

## 2017-01-28 ENCOUNTER — Telehealth: Payer: Self-pay

## 2017-01-28 ENCOUNTER — Telehealth: Payer: Self-pay | Admitting: Family

## 2017-01-28 DIAGNOSIS — K220 Achalasia of cardia: Secondary | ICD-10-CM | POA: Diagnosis not present

## 2017-01-28 DIAGNOSIS — N184 Chronic kidney disease, stage 4 (severe): Secondary | ICD-10-CM | POA: Diagnosis not present

## 2017-01-28 DIAGNOSIS — E1122 Type 2 diabetes mellitus with diabetic chronic kidney disease: Secondary | ICD-10-CM | POA: Diagnosis not present

## 2017-01-28 DIAGNOSIS — I5032 Chronic diastolic (congestive) heart failure: Secondary | ICD-10-CM | POA: Diagnosis not present

## 2017-01-28 DIAGNOSIS — I130 Hypertensive heart and chronic kidney disease with heart failure and stage 1 through stage 4 chronic kidney disease, or unspecified chronic kidney disease: Secondary | ICD-10-CM | POA: Diagnosis not present

## 2017-01-28 DIAGNOSIS — E43 Unspecified severe protein-calorie malnutrition: Secondary | ICD-10-CM | POA: Diagnosis not present

## 2017-01-28 NOTE — Telephone Encounter (Signed)
Transition Care Management Follow-up Telephone Call   Date discharged? 01/26/17   How have you been since you were released from the hospital? Called pt spoke w/wife she states pt is doing alright   Do you understand why you were in the hospital? YES   Do you understand the discharge instructions? YES   Where were you discharged to? Home   Items Reviewed:  Medications reviewed: YES  Allergies reviewed: YES  Dietary changes reviewed: YES, carb modified and heart healthy  Referrals reviewed: YES, still waiting on oncologist appt   Functional Questionnaire:   Activities of Daily Living (ADLs):   He states he are independent in the following: feeding, continence, grooming, toileting and dressing   States they require assistance with the following: ambulation and bathing and hygiene   Any transportation issues/concerns?: NO   Any patient concerns? NO   Confirmed importance and date/time of follow-up visits scheduled YES, appt 01/29/17  Provider Appointment booked with Wilfred Lacy, NP  Confirmed with patient if condition begins to worsen call PCP or go to the ER.  Patient was given the office number and encouraged to call back with question or concerns.  : YES

## 2017-01-28 NOTE — Telephone Encounter (Signed)
Forwarding to provider doing the hospital follow up.

## 2017-01-28 NOTE — Telephone Encounter (Signed)
Patient just released from hospital.  Pam called requesting orders.  States patient has high anxiety as well as high blood sugar.  Also, states patient has history of congestive heart failure.  I have scheduled patient with Baldo Ash for tomorrow afternoon for a hospital fu.  Informed spouse that patient can schedule follow/transfer appt after he completes his OV tomorrow with Baldo Ash.  Please follow back up in regard with Pam.

## 2017-01-28 NOTE — Telephone Encounter (Signed)
We cannot as these are from the hospital and he has not come for hospital follow up.

## 2017-01-28 NOTE — Telephone Encounter (Signed)
Gretchin called to get verbal order for pt.   PT 2xweek for 4 weeks.    Call back number [phone]

## 2017-01-29 ENCOUNTER — Encounter: Payer: Self-pay | Admitting: Internal Medicine

## 2017-01-29 ENCOUNTER — Ambulatory Visit (INDEPENDENT_AMBULATORY_CARE_PROVIDER_SITE_OTHER): Payer: Medicare Other | Admitting: Internal Medicine

## 2017-01-29 ENCOUNTER — Ambulatory Visit: Payer: Medicare Other | Admitting: Nurse Practitioner

## 2017-01-29 ENCOUNTER — Other Ambulatory Visit (INDEPENDENT_AMBULATORY_CARE_PROVIDER_SITE_OTHER): Payer: Medicare Other

## 2017-01-29 VITALS — BP 150/80 | HR 97 | Temp 97.5°F | Resp 16 | Wt 121.0 lb

## 2017-01-29 DIAGNOSIS — R531 Weakness: Secondary | ICD-10-CM

## 2017-01-29 DIAGNOSIS — J984 Other disorders of lung: Secondary | ICD-10-CM | POA: Diagnosis not present

## 2017-01-29 DIAGNOSIS — E43 Unspecified severe protein-calorie malnutrition: Secondary | ICD-10-CM

## 2017-01-29 DIAGNOSIS — I10 Essential (primary) hypertension: Secondary | ICD-10-CM | POA: Diagnosis not present

## 2017-01-29 DIAGNOSIS — N183 Chronic kidney disease, stage 3 unspecified: Secondary | ICD-10-CM

## 2017-01-29 DIAGNOSIS — E1122 Type 2 diabetes mellitus with diabetic chronic kidney disease: Secondary | ICD-10-CM

## 2017-01-29 DIAGNOSIS — K220 Achalasia of cardia: Secondary | ICD-10-CM

## 2017-01-29 DIAGNOSIS — E860 Dehydration: Secondary | ICD-10-CM

## 2017-01-29 DIAGNOSIS — C61 Malignant neoplasm of prostate: Secondary | ICD-10-CM | POA: Diagnosis not present

## 2017-01-29 LAB — COMPREHENSIVE METABOLIC PANEL
ALK PHOS: 89 U/L (ref 39–117)
ALT: 10 U/L (ref 0–53)
AST: 20 U/L (ref 0–37)
Albumin: 3.1 g/dL — ABNORMAL LOW (ref 3.5–5.2)
BILIRUBIN TOTAL: 0.5 mg/dL (ref 0.2–1.2)
BUN: 43 mg/dL — ABNORMAL HIGH (ref 6–23)
CO2: 20 meq/L (ref 19–32)
CREATININE: 2.2 mg/dL — AB (ref 0.40–1.50)
Calcium: 9.5 mg/dL (ref 8.4–10.5)
Chloride: 106 mEq/L (ref 96–112)
GFR: 37.62 mL/min — ABNORMAL LOW (ref >60.00)
GLUCOSE: 137 mg/dL — AB (ref 70–99)
Potassium: 3.8 mEq/L (ref 3.5–5.1)
SODIUM: 145 meq/L (ref 135–145)
TOTAL PROTEIN: 6.1 g/dL (ref 6.0–8.3)

## 2017-01-29 LAB — CBC WITH DIFFERENTIAL/PLATELET
BASOS ABS: 0 10*3/uL (ref 0.0–0.1)
Basophils Relative: 0.4 % (ref 0.0–3.0)
EOS ABS: 0 10*3/uL (ref 0.0–0.7)
Eosinophils Relative: 0.7 % (ref 0.0–5.0)
HCT: 28.2 % — ABNORMAL LOW (ref 39.0–52.0)
Hemoglobin: 9 g/dL — ABNORMAL LOW (ref 13.0–17.0)
LYMPHS ABS: 0.9 10*3/uL (ref 0.7–4.0)
Lymphocytes Relative: 14.6 % (ref 12.0–46.0)
MCHC: 31.8 g/dL (ref 30.0–36.0)
MCV: 99.7 fl (ref 78.0–100.0)
MONO ABS: 0.5 10*3/uL (ref 0.1–1.0)
Monocytes Relative: 8.1 % (ref 3.0–12.0)
NEUTROS ABS: 4.9 10*3/uL (ref 1.4–7.7)
NEUTROS PCT: 76.2 % (ref 43.0–77.0)
PLATELETS: 339 10*3/uL (ref 150.0–400.0)
RBC: 2.83 Mil/uL — AB (ref 4.22–5.81)
RDW: 16 % — ABNORMAL HIGH (ref 11.5–15.5)
WBC: 6.4 10*3/uL (ref 4.0–10.5)

## 2017-01-29 NOTE — Assessment & Plan Note (Signed)
BP Readings from Last 3 Encounters:  01/29/17 (!) 150/80  01/26/17 (!) 110/93  01/18/17 118/70   Blood pressure has been variable Continue current medication and monitor

## 2017-01-29 NOTE — Telephone Encounter (Signed)
Spoke with gretchen to give verbal orders per MD

## 2017-01-29 NOTE — Assessment & Plan Note (Addendum)
Severe protein-calorie malnutrition Currently only on a liquid diet-we will continue until he sees GI Nutrition was consulted in the hospital Continue Ensure

## 2017-01-29 NOTE — Telephone Encounter (Signed)
Ok for verbals 

## 2017-01-29 NOTE — Patient Instructions (Addendum)
  Test(s) ordered today. Your results will be released to [place] (or called to you) after review, usually within 72hours after test completion. If any changes need to be made, you will be notified at that same time.   Medications reviewed and updated.  No changes recommended at this time.   A referral was ordered for pulmonary for your lung lesion and Dr Alen Blew for follow up of your prostate cancer.    You also need to follow up with your kidney doctor, Dr Silverio Decamp and Dr Dwyane Dee.    Please establish with your new primary care provider

## 2017-01-29 NOTE — Telephone Encounter (Signed)
FYI, pt is seeing Burns at 1 pm today.

## 2017-01-29 NOTE — Assessment & Plan Note (Signed)
Improved during his hospitalization with IV hydration Lasix was discontinued We will need to monitor his kidney function and fluid status

## 2017-01-29 NOTE — Assessment & Plan Note (Signed)
Significant generalized weakness May need home physical therapy

## 2017-01-29 NOTE — Assessment & Plan Note (Signed)
Needs to follow-up with oncology Discussed that they could call the office directly, but his brother would prefer if I put in a referral so they called him Referral ordered Stressed the importance of following up with them

## 2017-01-29 NOTE — Assessment & Plan Note (Signed)
Needs pulmonary f/u Referred today

## 2017-01-29 NOTE — Progress Notes (Signed)
Subjective:    Patient ID: [name], male    DOB: [birth year], 76 y.o.   MRN: [id number]  HPI The patient is here for follow up from the hospital.  Admitted 01/21/17 - 01/26/17 for generalized weakness.  Recommendations for Outpatient Follow-up:  1. F/u with PMD within a week  for hospital discharge follow up, repeat cbc/bmp at follow up 2. F/u with gi for esophageal achalasia, remain on full liquid diet until follow up with gi. GI to decide on diet advancement 3. F/u with oncology Dr Alen Blew for prostate cancer 4. F/u with pulmonology for cavitary lung lesion right upper lobe 5. F/u with endocrinology for diabetes control, he tends to have hypoglycemia due to poor oral intake.  He was feeling weak for two weeks, which was progressively getting worse.  He has decreased appetite and oral intake.  He has esophageal achalasia and has had botox injections.  He did not have difficulty swallowing or choking.  He had loose stools for two days but was taking colace.  He denied abdominal pain, vomiting and nausea.  He had a slight cough last week, but a chest xray a few days prior was negative for pneumonia.  He denies chest pain, SOB, fever/chills.  There were no symptoms of UTI.    In the ED, he was hypotensive 86/53, which improved to 129/79 after 250 ml of saline by EMS.    His Na was 151, K was 3.3, WBC 8.6, kidney function was slightly worse, his oxygen saturation was 96% on RA.    Anorexia, weight loss, weakness, hypotension, FTT:   Some of his symptoms secondary to dehydration from esophageal achalasia.  He also has metastatic prostate cancer and was on zytia and prednisone, but that was d/c'd due to fatigue and anorexia.  He was recently started on xtandi.  Cancer and medication may/may not be contributing.  Tumors had increased in size on recent ct scan.  BUN has increased and this ca that may also be contributing to his somewhat n/v and anorexia.   -  Improved with hydration  -  discharged on liquid only diet, needs GI and oncology f/u  - he is somewhat compliant with a liquid diet.  He has tried jello and pudding.  He is not able to keep everything down - some of it comes back up, which is not new.    He states he will often feel hungry, but then has no desire to eat. He does not understand why he has no appetite and cannot eat. He feels he is drinking enough fluids  Lung cavitation in RLL on Ct:  Stable.  Needs pulm outpatient follow up Referral ordered for pulmonary  Hypernatremia;  Likely from dehydration, admitted with Na 151 - normalized with hydration Will recheck CMP today  NSVT: on tele, asymptomatic BB medication held due to hypotension Keep potassium  > 4, magnesium  > 2   AKI on CKD stage 4: UA normal. Ct A/P done Lasix held and d/c'd upon discharge Avoid nephrotoxins actos d/c'd, glipizide and tradjenta continued  Chronic diastolic CHF:  Admitted with dehydration, lasix held, received hydration, due to poor oral intake, improved with hydration  Diabetes:  a1c 7.2, meds initially held, actos stopped, resumed glipizide and tradjenta. His sugars are high in the morning , 250 and around 117 in the morning. He was confused about what to do if his sugar was low and we reviewed that he should drink orange juice He does  have an appointment for follow-up with Dr. Dwyane Dee.  Advised him to call us or his office if there are questions regarding his sugars before seeing him  Chronic normocytic anemia:  No bleeding. Likely anemia of chronic disease, on iron supplementation.    Severe malnutrition with weight loss:  Nutrition consulted while in the hospital. He is on a liquid diet for now Encouraged him to continue drinking Ensure   Discussed that he needs to follow-up with oncology for his metastatic prostate cancer.  He does not have an appointment and would prefer me to put in a referral so that they call him.   Medications and allergies reviewed with  patient and updated if appropriate.  Patient Active Problem List   Diagnosis Date Noted  . Cavitary lesion of lung 01/29/2017  . Hypotension 01/21/2017  . Chronic diastolic CHF (congestive heart failure) ([place]) 01/21/2017  . Chronic kidney disease, stage 3 ([place]) 07/24/2016  . Hypoglycemia 04/11/2016  . Orthostatic hypotension 04/11/2016  . Symptomatic anemia 03/14/2016  . Sepsis ([place]) 02/21/2016  . Heme positive stool   . Pressure injury of skin 02/19/2016  . Hypernatremia 02/19/2016  . Acute esophagitis   . Food impaction of esophagus   . Achalasia   . Acute GI bleeding 02/16/2016  . Acute blood loss anemia 02/16/2016  . Anemia 02/15/2016  . Generalized weakness 01/29/2016  . Right leg weakness 01/29/2016  . HCAP (healthcare-associated pneumonia) 01/28/2016  . Dehydration   . Hypomagnesemia   . Protein-calorie malnutrition, severe 01/09/2016  . Hyperglycemia 01/08/2016  . Hypokalemia 01/08/2016  . Acute renal failure superimposed on stage 3 chronic kidney disease ([place]) 01/08/2016  . Prolonged Q-T interval on ECG 01/08/2016  . 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 Bilateral lower extremity edema 09/07/2013  . Pulmonary embolus ([place]) 09/01/2010  . Chronic deep vein thrombosis (DVT) ([place]) 08/25/2010  . 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 Hyperlipidemia associated with type 2 diabetes mellitus ([place]) 09/13/2006  . Essential hypertension 09/13/2006  . Prostate cancer metastatic to multiple sites [facility]) 09/13/2006    Current Outpatient Prescriptions on File Prior to Visit  Medication Sig Dispense Refill  . ACCU-CHEK FASTCLIX LANCETS MISC Use to check blood sugars twice daily E11.65 180 each 0  . acetaminophen (TYLENOL) 650 MG CR tablet Take 650 mg by mouth every 4 (four) hours as needed for pain.    . Amino Acids-Protein Hydrolys (FEEDING SUPPLEMENT, PRO-STAT SUGAR FREE 64,) LIQD Take 30 mLs by mouth 2 (two) times daily. (Patient taking  differently: Take 30 mLs by mouth daily. ) 900 mL 0  . atorvastatin (LIPITOR) 10 MG tablet Take 1 tablet (10 mg total) by mouth at bedtime. 90 tablet 1  . docusate sodium (COLACE) 100 MG capsule TAKE ONE CAPSULE BY MOUTH TWICE A DAY FOR CONSTIPATION (Patient taking differently: TAKE ONE CAPSULE BY MOUTH TWICE A DAY) 60 capsule 0  . feeding supplement, ENSURE ENLIVE, (ENSURE ENLIVE) LIQD Take 237 mLs by mouth 2 (two) times daily between meals. 237 mL 12  . ferrous sulfate 325 (65 FE) MG tablet TAKE 1 TABLET BY MOUTH EVERY DAY FOR SUPPLEMENT 90 tablet 0  . glipiZIDE (GLUCOTROL XL) 5 MG 24 hr tablet Take 1 tablet (5 mg total) by mouth daily with breakfast. 90 tablet 1  . glucose blood test strip Use as instructed 100 each 12  . metoprolol tartrate (LOPRESSOR) 25 MG tablet Take 0.5 tablets (12.5 mg total) by mouth 2 (two) times daily. [place]  tablet 0  . Multiple Vitamin (MULTIVITAMIN WITH MINERALS) TABS tablet Take 1 tablet by mouth daily.    . ondansetron (ZOFRAN-ODT) 4 MG disintegrating tablet Take 1 tablet (4 mg total) by mouth every 8 (eight) hours as needed for nausea or vomiting. 20 tablet 0  . pantoprazole (PROTONIX) 40 MG tablet Take 1 tablet (40 mg total) by mouth 2 (two) times daily. 180 tablet 1  . TRADJENTA 5 MG TABS tablet TAKE 1 TABLET (5 MG TOTAL) BY MOUTH DAILY. 90 tablet 0   No current facility-administered medications on file prior to visit.     Past Medical History:  Diagnosis Date  . Anemia    IRON TRANSFUSION 1 MONTH AGO  . Blood clot in vein 2008   LEFT LEG  . Cataract    LEFT  . CKD (chronic kidney disease) 02/2016  . Diabetes mellitus, type 2 ([place]) 2008  . GERD (gastroesophageal reflux disease)   . Hyperlipidemia 2008  . Hypertension 2008  . Pneumonia 01/18/2016  . Prostate cancer ([place]) 2006   IMPLANT IN LEFT ARM FOR [place]    Past Surgical History:  Procedure Laterality Date  . BALLOON DILATION N/A 04/13/2016   Procedure: BALLOON DILATION;  Surgeon: Gatha Mayer, MD;   Location: [facility] ENDOSCOPY;  Service: Endoscopy;  Laterality: N/A;  . BOTOX INJECTION N/A 04/13/2016   Procedure: BOTOX INJECTION;  Surgeon: Gatha Mayer, MD;  Location: [place];  Service: Endoscopy;  Laterality: N/A;  . BOTOX INJECTION N/A 01/02/2017   Procedure: BOTOX INJECTION;  Surgeon: Yetta Flock, MD;  Location: [facility] ENDOSCOPY;  Service: Gastroenterology;  Laterality: N/A;  . COLONOSCOPY W/ POLYPECTOMY  03/2008   diminutive rectal polyp (path: prolapse type polyp, not adenoma).  moderate pan-diverticulitis.   [facility] ESOPHAGEAL MANOMETRY  1984   unable to pass manometry catheter beyond LES. Aperiystalsis of esophagus: sugg of achalasia.   [facility] Kitchen ESOPHAGOGASTRODUODENOSCOPY N/A 02/17/2016   Procedure: ESOPHAGOGASTRODUODENOSCOPY (EGD);  Surgeon: [name], MD;  Location: [facility];  Service: Endoscopy;  Laterality: N/A;  . ESOPHAGOGASTRODUODENOSCOPY (EGD) WITH PROPOFOL N/A 02/21/2016   Procedure: ESOPHAGOGASTRODUODENOSCOPY (EGD) WITH PROPOFOL;  Surgeon: Jerene Bears, MD;  Location: [facility] ENDOSCOPY;  Service: Gastroenterology;  Laterality: N/A;  . ESOPHAGOGASTRODUODENOSCOPY (EGD) WITH PROPOFOL N/A 02/28/2016   Procedure: ESOPHAGOGASTRODUODENOSCOPY (EGD) WITH PROPOFOL withBOTOX;  Surgeon: Manus Gunning, MD;  Location: [facility] ENDOSCOPY;  Service: Gastroenterology;  Laterality: N/A;  . ESOPHAGOGASTRODUODENOSCOPY (EGD) WITH PROPOFOL N/A 04/13/2016   Procedure: ESOPHAGOGASTRODUODENOSCOPY (EGD) WITH PROPOFOL;  Surgeon: Gatha Mayer, MD;  Location: [facility];  Service: Endoscopy;  Laterality: N/A;  . ESOPHAGOGASTRODUODENOSCOPY (EGD) WITH PROPOFOL N/A 01/02/2017   Procedure: ESOPHAGOGASTRODUODENOSCOPY (EGD) WITH PROPOFOL;  Surgeon: Yetta Flock, MD;  Location: [facility] ENDOSCOPY;  Service: Gastroenterology;  Laterality: N/A;  . LEFT ARM IMPLANT X2     RADIATION TX ALSO    Social History   Social History  . Marital status: Married    Spouse name: N/A  . Number of children: 2  .  Years of education: 15   Occupational History  . retired    Social History Main Topics  . Smoking status: Former Smoker    Years: 10.00    Quit date: 2000  . Smokeless tobacco: Never Used  . Alcohol use No  . Drug use: No  . Sexual activity: Not on file   Other Topics Concern  . Not on file   Social History Narrative   Fun/Hobby: Football    Family History  Problem  Relation Age of Onset  . Diabetes Father   . Diabetes Sister   . Hypertension Sister   . Hypertension Brother   . Heart disease Neg Hx     Review of Systems  Constitutional: Positive for fatigue. Negative for chills and fever.       Hungry but does not feel like eating  HENT: Negative for trouble swallowing.   Respiratory: Positive for shortness of breath (with exertion). Negative for cough and wheezing.   Cardiovascular: Negative for chest pain, palpitations and leg swelling.  Gastrointestinal: Negative for abdominal pain.  Endocrine: Positive for cold intolerance.  Neurological: Negative for light-headedness and headaches.       Objective:   Vitals:   01/29/17 1323  BP: (!) 150/80  Pulse: 97  Resp: 16  Temp: (!) 97.5 F (36.4 C)  SpO2: 94%   Wt Readings from Last 3 Encounters:  01/29/17 121 lb (54.9 kg)  01/26/17 121 lb 14.6 oz (55.3 kg)  01/02/17 130 lb (59 kg)   Body mass index is 18.4 kg/m.   Physical Exam    Constitutional: Chronically ill-appearing, malnutritioned appearing . No distress.  HENT:  Head: Normocephalic and atraumatic.  Neck: Neck supple. No tracheal deviation present. No thyromegaly present.  No cervical lymphadenopathy Cardiovascular: Normal rate, regular rhythm and normal heart sounds.   No murmur heard. No carotid bruit .  No edema Pulmonary/Chest: Effort normal and breath sounds normal. No respiratory distress. No has no wheezes. No rales.  Abdomen: Soft, nondistended, nontender Skin: Skin is warm and dry. Not diaphoretic.  Psychiatric: Normal mood and affect.  Behavior is normal.   Blood work reviewed   DG Esophagus CLINICAL DATA:  76 year old male with achalasia. Vomiting. Decreased p.o. intake. Dehydration.  EXAM: ESOPHOGRAM/BARIUM SWALLOW  TECHNIQUE: Single contrast examination was performed using  thin barium.  FLUOROSCOPY TIME:  Fluoroscopy Time:  1 minutes 18 seconds  Radiation Exposure Index (if provided by the fluoroscopic device): 10.60mG y  Number of Acquired Spot Images: 0  COMPARISON:  CT Abdomen and Pelvis 01/23/2017. Esophagram 04/12/2016.  FINDINGS: Due to limited mobility, the study was performed with the patient in a left lateral oblique position on the fluoroscopy table with the head of the table inclined to 30 degrees.  p.o. ingestion of barium demonstrated accumulation of contrast within a severely dilated esophagus. Contrast was seen to be diffused into preexisting fluid within the esophagus. Contrasted accumulate in the distal esophagus at the level of the diaphragm. No esophageal motility and no transit of contrast through the gastroesophageal junction was observed with fluoroscopy during the first 3 minutes of the exam.  A overhead 18 minutes delayed image demonstrated persistent contrast within the dilated distal esophagus, and no evidence of contrast transit into the stomach. However, yesterday oral contrast administered as part of the CT Abdomen and Pelvis is visible throughout the transverse colon and at both flexures.  IMPRESSION: Chronic esophageal achalasia.  Severely dilated and fluid-filled esophagus with no contrast transit into the stomach over a period of 18 minutes.  Electronically Signed   By: Genevie Ann M.D.   On: 01/24/2017 13:11 CT CHEST WO CONTRAST : CLINICAL DATA: Advanced prostate cancer, post systemic therapy.  Assess treatment response. Oral contrast only.  EXAM: CT CHEST without IV contrast  TECHNIQUE: Multidetector CT imaging of the chest, abdomen and pelvis  was performed following the standard protocol without IV contrast.  COMPARISON: CT abdomen and pelvis from 11/07/2015, PET CT 11/17/2015  FINDINGS: This report was included as  part of the abdomen and pelvis CT dictation.  No change to the body of the report.  Cardiovascular: The heart size is normal with coronary arteriosclerosis along the LAD, RCA and circumflex. No aortic aneurysm. Minimal aortic atherosclerosis at the arch and along the descending portion. The unenhanced pulmonary vasculature is unremarkable.  Mediastinum/Nodes: Calcified right lower paratracheal lymph nodes are noted without adenopathy. No axillary, mediastinal or hilar lymphadenopathy given limitations of a noncontrast study. Marked contrast distention of the esophagus tapering at the GE junction consistent with achalasia. Contrast is noted within the stomach suggesting patency of the GE junction.  Lungs/Pleura: 2.1 cm thin walled cavitation is relatively stable in appearance relative to PET-CT noted within the right lower lobe. No effusion or pneumothorax. No pneumonic consolidation. Scarring is seen at the left lung apex.  Musculoskeletal: No chest wall mass or suspicious bone lesions identified.  IMPRESSION: Stable 2.1 cm thin walled cavitation in the right lower lobe.  Dilated contrast distention of the esophagus with tapering at the GE junction consistent with an achalasia.  Aortic Atherosclerosis (ICD10-I70.0).  Electronically Signed   By: Ashley Royalty M.D.   On: 01/24/2017 01:55 CT ABDOMEN PELVIS WO CONTRAST CLINICAL DATA:  Advanced prostate cancer, post systemic therapy. Assess treatment response. Oral contrast only.  EXAM: CT CHEST, ABDOMEN AND PELVIS WITHOUT CONTRAST  TECHNIQUE: Multidetector CT imaging of the chest, abdomen and pelvis was performed following the standard protocol without IV contrast.  COMPARISON:  CT abdomen and pelvis from 11/07/2015, PET  CT 11/17/2015  FINDINGS: CT CHEST FINDINGS  Cardiovascular: The heart size is normal with coronary arteriosclerosis along the LAD, RCA and circumflex. No aortic aneurysm. Minimal aortic atherosclerosis at the arch and along the descending portion. The unenhanced pulmonary vasculature is unremarkable.  Mediastinum/Nodes: Calcified right lower paratracheal lymph nodes are noted without adenopathy. No axillary, mediastinal or hilar lymphadenopathy given limitations of a noncontrast study. Marked contrast distention of the esophagus tapering at the GE junction consistent with achalasia. Contrast is noted within the stomach suggesting patency of the GE junction.  Lungs/Pleura: 2.1 cm thin walled cavitation is relatively stable in appearance relative to PET-CT noted within the right lower lobe. No effusion or pneumothorax. No pneumonic consolidation. Scarring is seen at the left lung apex.  Musculoskeletal: No chest wall mass or suspicious bone lesions identified.  CT ABDOMEN PELVIS FINDINGS  Hepatobiliary: No focal liver abnormality is seen. No gallstones, gallbladder wall thickening, or biliary dilatation. The gallbladder appears contracted.  Pancreas: No ductal dilatation. Pancreatic tail calcification may reflect stigmata of chronic pancreatitis.  Spleen: Normal size without mass.  Adrenals/Urinary Tract: Chronic thickening of the left adrenal gland without focal mass. The right adrenal gland is unremarkable. Bilateral renal cysts are noted without evidence of obstructive uropathy. No hydroureteronephrosis. The urinary bladder is physiologically distended.  Stomach/Bowel: Contrast reaches the rectum. There is colonic diverticulosis without acute diverticulitis. Normal small bowel rotation without obstruction or inflammation. Normal appendix.  Vascular/Lymphatic: Mild aortoiliac atherosclerosis without aneurysm. No lymphadenopathy.  Reproductive: Enlarged prostate with  coarse peripheral zone calcifications are again identified. Presacral, perirectal homogeneous soft tissue masses are again visualized which appear to have increased in size from prior PET-CT, the more anterior and left-sided mass measuring 5 x 2.1 cm, series 2, image 106 versus 2.5 x 2.8 cm previously and the more midline posterior presacral mass is estimated at 3.2 x 4.3 cm versus 2.9 x 2.6 cm, as measured on series 2, image 109.  Other: No free air nor free fluid.  Musculoskeletal: Subchondral cystic change of the right acetabulum, left iliac bone adjacent to the SI joint with patchy osteopenic appearance of the iliac bone. No definite osteoblastic disease.  IMPRESSION: 1. Stable 2.1 cm thin walled cavitation in the right lower lobe. 2. Marked contrast distention of the esophagus consistent with achalasia. 3. Interval increase in size of presacral soft tissue masses now estimated at 5 x 2.1 cm versus 2.5 x 2.8 cm and 3.2 x 4.3 cm versus 2.9 x 2.6 cm despite reported systemic therapy for recurrence of prostate cancer. 4. Stable thickening of the left adrenal gland with right-sided water attenuating renal cysts.  Electronically Signed   By: Ashley Royalty M.D.   On: 01/24/2017 01:03     Assessment & Plan:   Needs to establish with a new primary care provider-we will do that today  See Problem List for Assessment and Plan of chronic medical problems.

## 2017-01-29 NOTE — Assessment & Plan Note (Signed)
Has an appointment with Dr. Dwyane Dee Sugars are variable Currently on a liquid diet Continue current medications Encouraged him to try to consume liquids regularly and drink orange if his sugar is low Call Dr. Dwyane Dee in the meantime with any concerns regarding sugars

## 2017-01-29 NOTE — Assessment & Plan Note (Signed)
Following with GI and has an upcoming appointment Continue liquid diet Continue Zofran and pantoprazole

## 2017-01-30 ENCOUNTER — Telehealth: Payer: Self-pay

## 2017-01-30 NOTE — Telephone Encounter (Signed)
Pt has been informed and expressed understanding.  

## 2017-01-30 NOTE — Telephone Encounter (Signed)
-----   Message from Binnie Rail, MD sent at 01/29/2017 10:17 PM EDT ----- Kidney function slightly improved.  Liver tests normal.  Anemia is stable.

## 2017-02-04 DIAGNOSIS — K220 Achalasia of cardia: Secondary | ICD-10-CM | POA: Diagnosis not present

## 2017-02-04 DIAGNOSIS — E1122 Type 2 diabetes mellitus with diabetic chronic kidney disease: Secondary | ICD-10-CM | POA: Diagnosis not present

## 2017-02-04 DIAGNOSIS — I130 Hypertensive heart and chronic kidney disease with heart failure and stage 1 through stage 4 chronic kidney disease, or unspecified chronic kidney disease: Secondary | ICD-10-CM | POA: Diagnosis not present

## 2017-02-04 DIAGNOSIS — N184 Chronic kidney disease, stage 4 (severe): Secondary | ICD-10-CM | POA: Diagnosis not present

## 2017-02-04 DIAGNOSIS — E43 Unspecified severe protein-calorie malnutrition: Secondary | ICD-10-CM | POA: Diagnosis not present

## 2017-02-04 DIAGNOSIS — I5032 Chronic diastolic (congestive) heart failure: Secondary | ICD-10-CM | POA: Diagnosis not present

## 2017-02-05 DIAGNOSIS — K220 Achalasia of cardia: Secondary | ICD-10-CM | POA: Diagnosis not present

## 2017-02-05 DIAGNOSIS — E1122 Type 2 diabetes mellitus with diabetic chronic kidney disease: Secondary | ICD-10-CM | POA: Diagnosis not present

## 2017-02-05 DIAGNOSIS — I130 Hypertensive heart and chronic kidney disease with heart failure and stage 1 through stage 4 chronic kidney disease, or unspecified chronic kidney disease: Secondary | ICD-10-CM | POA: Diagnosis not present

## 2017-02-05 DIAGNOSIS — E43 Unspecified severe protein-calorie malnutrition: Secondary | ICD-10-CM | POA: Diagnosis not present

## 2017-02-05 DIAGNOSIS — I5032 Chronic diastolic (congestive) heart failure: Secondary | ICD-10-CM | POA: Diagnosis not present

## 2017-02-05 DIAGNOSIS — N184 Chronic kidney disease, stage 4 (severe): Secondary | ICD-10-CM | POA: Diagnosis not present

## 2017-02-07 ENCOUNTER — Other Ambulatory Visit (INDEPENDENT_AMBULATORY_CARE_PROVIDER_SITE_OTHER): Payer: Medicare Other

## 2017-02-07 DIAGNOSIS — K220 Achalasia of cardia: Secondary | ICD-10-CM | POA: Diagnosis not present

## 2017-02-07 DIAGNOSIS — I5032 Chronic diastolic (congestive) heart failure: Secondary | ICD-10-CM | POA: Diagnosis not present

## 2017-02-07 DIAGNOSIS — E1165 Type 2 diabetes mellitus with hyperglycemia: Secondary | ICD-10-CM

## 2017-02-07 DIAGNOSIS — E1122 Type 2 diabetes mellitus with diabetic chronic kidney disease: Secondary | ICD-10-CM | POA: Diagnosis not present

## 2017-02-07 DIAGNOSIS — I130 Hypertensive heart and chronic kidney disease with heart failure and stage 1 through stage 4 chronic kidney disease, or unspecified chronic kidney disease: Secondary | ICD-10-CM | POA: Diagnosis not present

## 2017-02-07 DIAGNOSIS — E43 Unspecified severe protein-calorie malnutrition: Secondary | ICD-10-CM | POA: Diagnosis not present

## 2017-02-07 DIAGNOSIS — N184 Chronic kidney disease, stage 4 (severe): Secondary | ICD-10-CM | POA: Diagnosis not present

## 2017-02-07 LAB — BASIC METABOLIC PANEL
BUN: 41 mg/dL — ABNORMAL HIGH (ref 6–23)
CHLORIDE: 109 meq/L (ref 96–112)
CO2: 22 mEq/L (ref 19–32)
Calcium: 9.4 mg/dL (ref 8.4–10.5)
Creatinine, Ser: 2.34 mg/dL — ABNORMAL HIGH (ref 0.40–1.50)
GFR: 35.03 mL/min — AB (ref >60.00)
Glucose, Bld: 61 mg/dL — ABNORMAL LOW (ref 70–99)
POTASSIUM: 3.5 meq/L (ref 3.5–5.1)
SODIUM: 149 meq/L — AB (ref 135–145)

## 2017-02-07 LAB — HEMOGLOBIN A1C: Hgb A1c MFr Bld: 5.8 % (ref 4.6–6.5)

## 2017-02-10 ENCOUNTER — Other Ambulatory Visit: Payer: Self-pay | Admitting: Endocrinology

## 2017-02-11 DIAGNOSIS — I130 Hypertensive heart and chronic kidney disease with heart failure and stage 1 through stage 4 chronic kidney disease, or unspecified chronic kidney disease: Secondary | ICD-10-CM | POA: Diagnosis not present

## 2017-02-11 DIAGNOSIS — E1122 Type 2 diabetes mellitus with diabetic chronic kidney disease: Secondary | ICD-10-CM | POA: Diagnosis not present

## 2017-02-11 DIAGNOSIS — E43 Unspecified severe protein-calorie malnutrition: Secondary | ICD-10-CM | POA: Diagnosis not present

## 2017-02-11 DIAGNOSIS — I5032 Chronic diastolic (congestive) heart failure: Secondary | ICD-10-CM | POA: Diagnosis not present

## 2017-02-11 DIAGNOSIS — K220 Achalasia of cardia: Secondary | ICD-10-CM | POA: Diagnosis not present

## 2017-02-11 DIAGNOSIS — N184 Chronic kidney disease, stage 4 (severe): Secondary | ICD-10-CM | POA: Diagnosis not present

## 2017-02-12 ENCOUNTER — Ambulatory Visit (INDEPENDENT_AMBULATORY_CARE_PROVIDER_SITE_OTHER): Payer: Medicare Other | Admitting: Endocrinology

## 2017-02-12 ENCOUNTER — Other Ambulatory Visit: Payer: Self-pay

## 2017-02-12 ENCOUNTER — Encounter: Payer: Self-pay | Admitting: Endocrinology

## 2017-02-12 VITALS — BP 102/74 | HR 60 | Ht 67.5 in | Wt 120.2 lb

## 2017-02-12 DIAGNOSIS — R634 Abnormal weight loss: Secondary | ICD-10-CM | POA: Diagnosis not present

## 2017-02-12 DIAGNOSIS — I951 Orthostatic hypotension: Secondary | ICD-10-CM | POA: Diagnosis not present

## 2017-02-12 DIAGNOSIS — N183 Chronic kidney disease, stage 3 (moderate): Secondary | ICD-10-CM

## 2017-02-12 DIAGNOSIS — E1122 Type 2 diabetes mellitus with diabetic chronic kidney disease: Secondary | ICD-10-CM

## 2017-02-12 LAB — CORTISOL
CORTISOL PLASMA: 26.9 ug/dL
Cortisol, Plasma: 37 ug/dL

## 2017-02-12 LAB — GLUCOSE, POCT (MANUAL RESULT ENTRY): POC GLUCOSE: 54 mg/dL — AB (ref 70–99)

## 2017-02-12 MED ORDER — PIOGLITAZONE HCL 15 MG PO TABS: 15.0000 mg | ORAL_TABLET | Freq: Every day | ORAL | 2 refills | Status: AC

## 2017-02-12 MED ORDER — COSYNTROPIN NICU IV SYRINGE 0.25 MG/ML (STANDARD DOSE)
0.2500 mg | Freq: Once | INTRAVENOUS | Status: AC
  Administered 2017-02-12: 0.25 mg via INTRAMUSCULAR

## 2017-02-12 NOTE — Addendum Note (Signed)
Addended by: Nile Riggs on: 02/12/2017 05:20 PM   Modules accepted: Orders

## 2017-02-12 NOTE — Addendum Note (Signed)
Addended by: Kaylyn Lim I on: 02/12/2017 10:15 AM   Modules accepted: Orders

## 2017-02-12 NOTE — Progress Notes (Signed)
Patient ID: [name], male   DOB: 12-Jan-1941, 76 y.o.   MRN: [id number]   Reason for Appointment : Followup  History of Present Illness          Diagnosis: Type 2 diabetes mellitus, date of diagnosis:   2001     Past history: He has had long-standing diabetes treated with various oral hypoglycemic drugs.  He has not been on metformin because of renal dysfunction He thinks he was given Actos for sometime but not clear if he was benefiting. This was stopped probably in 2012 but he does not think he had any side effects. He thinks he has been taking Tradjenta for a couple of years  His A1c has been monitored about once or twice a year as per records  Because of worsening A1c in 12/14 he was referred for further evaluation. Because of baseline A1c of 8.6% in 12/14 he was started on glipizide ER 5 mg and pioglitazone 30 mg daily in addition to his Tradjenta He was also started on home glucose monitoring with a One Touch monitor  Recent history:   He is taking a 3 drug regimen of glipizide ER 5 mg, Tradjenta and ?  Actos 15 mg   His A1c is now 5.8, previously has been ranging from 5.9 up to 7.2     Current management, blood sugar patterns and problems identified:  He has been taking his blood sugar readings sporadically and mostly midday or early afternoon but these do not seem to correlate with his lab glucose and blood sugar in the office today  Today was slightly confused in the office and his blood sugar was 54  His test strips were checked and they are not out of date  His appetite has been decreased and he has lost 20 pounds since his last visit  Also he has been weaker and has not been as active  Overall his diet has been supplemented with nutritional drinks like boost  He is supposed to be on Actos and this is not in his medication bag, he does not know when this was stopped  Glucose monitoring:   using a  One Touch ultra 2 monitor which was reviewed  today  Mean values apply above for all meters except median for One Touch  PRE-MEAL Fasting Lunch Dinner Bedtime Overall  Glucose range: ?  250  77-3 33   224, 236    Mean/median:     230     Hypoglycemia: None    Self-care: The diet that the patient has been following is: None  Meals: 3 meals per day. No sweet tea and juices such as grape or apple juice  Exercise: walks less now  Dietician visit:  10/04/16.            Weight history:  Wt Readings from Last 3 Encounters:  02/12/17 120 lb 3.2 oz (54.5 kg)  01/29/17 121 lb (54.9 kg)  01/26/17 121 lb 14.6 oz (55.3 kg)   Glycemic control:   Lab Results  Component Value Date   HGBA1C 5.8 02/07/2017   HGBA1C 7.2 (H) 11/08/2016   HGBA1C 6.8 (H) 08/24/2016   Lab Results  Component Value Date   MICROALBUR 2.5 (H) 06/14/2016   LDLCALC 63 08/24/2016   CREATININE 2.34 (H) 02/07/2017    Lab Results  Component Value Date   FRUCTOSAMINE 333 (H) 11/08/2016   FRUCTOSAMINE 401 (H) 10/04/2016   FRUCTOSAMINE 335 (H) 06/11/2016   FRUCTOSAMINE 299 (H)  12/23/2014      Allergies as of 02/12/2017   No Known Allergies     Medication List        Accurate as of 02/12/17  8:37 AM. Always use your most recent med list.          ACCU-CHEK FASTCLIX LANCETS Misc Use to check blood sugars twice daily E11.65   ACCU-CHEK FASTCLIX LANCETS Misc USE TO CHECK BLOOD SUGARS TWICE DAILY E11.65   acetaminophen 650 MG CR tablet Commonly known as:  TYLENOL Take 650 mg by mouth every 4 (four) hours as needed for pain.   atorvastatin 10 MG tablet Commonly known as:  LIPITOR Take 1 tablet (10 mg total) by mouth at bedtime.   docusate sodium 100 MG capsule Commonly known as:  COLACE TAKE ONE CAPSULE BY MOUTH TWICE A DAY FOR CONSTIPATION   feeding supplement (ENSURE ENLIVE) Liqd Take 237 mLs by mouth 2 (two) times daily between meals.   feeding supplement (PRO-STAT SUGAR FREE 64) Liqd Take 30 mLs by mouth 2 (two) times daily.     ferrous sulfate 325 (65 FE) MG tablet TAKE 1 TABLET BY MOUTH EVERY DAY FOR SUPPLEMENT   glipiZIDE 5 MG 24 hr tablet Commonly known as:  GLUCOTROL XL Take 1 tablet (5 mg total) by mouth daily with breakfast.   glucose blood test strip Use as instructed   metoprolol tartrate 25 MG tablet Commonly known as:  LOPRESSOR Take 0.5 tablets (12.5 mg total) by mouth 2 (two) times daily.   multivitamin with minerals Tabs tablet Take 1 tablet by mouth daily.   ondansetron 4 MG disintegrating tablet Commonly known as:  ZOFRAN-ODT Take 1 tablet (4 mg total) by mouth every 8 (eight) hours as needed for nausea or vomiting.   pantoprazole 40 MG tablet Commonly known as:  PROTONIX Take 1 tablet (40 mg total) by mouth 2 (two) times daily.   pioglitazone 15 MG tablet Commonly known as:  ACTOS Take 1 tablet (15 mg total) daily by mouth.   TRADJENTA 5 MG Tabs tablet Generic drug:  linagliptin TAKE 1 TABLET (5 MG TOTAL) BY MOUTH DAILY.       Allergies: No Known Allergies  Past Medical History:  Diagnosis Date  . Anemia    IRON TRANSFUSION 1 MONTH AGO  . Blood clot in vein 2008   LEFT LEG  . Cataract    LEFT  . CKD (chronic kidney disease) 02/2016  . Diabetes mellitus, type 2 ([place]) 2008  . GERD (gastroesophageal reflux disease)   . Hyperlipidemia 2008  . Hypertension 2008  . Pneumonia 01/18/2016  . Prostate cancer ([place]) 2006   IMPLANT IN LEFT ARM FOR [place]    Past Surgical History:  Procedure Laterality Date  . COLONOSCOPY W/ POLYPECTOMY  03/2008   diminutive rectal polyp (path: prolapse type polyp, not adenoma).  moderate pan-diverticulitis.   Mar[facility] Kitchen ESOPHAGEAL MANOMETRY  1984   unable to pass manometry catheter beyond LES. Aperiystalsis of esophagus: sugg of achalasia.   Marland Kitchen LEFT ARM IMPLANT X2     RADIATION [place] ALSO    Family History  Problem Relation Age of Onset  . Diabetes Father   . Diabetes Sister   . Hypertension Sister   . Hypertension Brother   . Heart disease Neg Hx      Social History:  reports that he quit smoking about 18 years ago. He quit after 10.00 years of use. he has never used smokeless tobacco. He reports that he does not drink  alcohol or use drugs.    Review of Systems   WEAKNESS and weight loss: He has had marked weight loss and not clear why His appetite is decreased, only occasionally will have nausea Currently not on any antihypertensive He had a cortisol level done during his admission and this was 17 However in the past he had been told that his cortisol level was low in the hospital and was at some point on steroids      Lipids:  taking  10 mg Lipitor with the following labs   Lab Results  Component Value Date   CHOL 137 08/24/2016   HDL 48.90 08/24/2016   LDLCALC 63 08/24/2016   LDLDIRECT 92.0 06/22/2014   TRIG 125.0 08/24/2016   CHOLHDL 3 08/24/2016         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 He has been told that he has a cataract since he was young                The blood pressure has been treated only with metoprolol low doses now    Followed by nephrologist Dr. Florene Glen for his chronic kidney disease Edema controlled, Lasix not in his medication list today  Lab Results  Component Value Date   CREATININE 2.34 (H) 02/07/2017   CREATININE 2.20 (H) 01/29/2017   CREATININE 2.52 (H) 01/26/2017      LABS:  Lab on 02/07/2017  Component Date Value Ref Range Status  . Hgb A1c MFr Bld 02/07/2017 5.8  4.6 - 6.5 % Final   Glycemic Control Guidelines for People with Diabetes:Non Diabetic:  <6%Goal of Therapy: <7%Additional Action Suggested:  >8%   . Sodium 02/07/2017 149* 135 - 145 mEq/L Final  . Potassium 02/07/2017 3.5  3.5 - 5.1 mEq/L Final  . Chloride 02/07/2017 109  96 - 112 mEq/L Final  . CO2 02/07/2017 22  19 - 32 mEq/L Final  . Glucose, Bld 02/07/2017 61* 70 - 99 mg/dL Final  . BUN 02/07/2017 41* 6 - 23 mg/dL Final  . Creatinine, Ser 02/07/2017 2.34* 0.40 - 1.50 mg/dL Final   . Calcium 02/07/2017 9.4  8.4 - 10.5 mg/dL Final  . GFR 02/07/2017 35.03* >60.00 mL/min Final    Physical Examination:  BP 102/74   Pulse 60   Ht 5' 7.5" (1.715 m)   Wt 120 lb 3.2 oz (54.5 kg)   BMI 18.55 kg/m     Standing blood pressure 85/65 on the right arm No edema present   ASSESSMENT/PLAN:  1. Diabetes type 2, nonobese See history of present illness for discussion of current diabetes management, blood sugar patterns and problems identified  His blood sugars now difficult to assess as his home readings have been higher but lab glucose appears to be relatively low Also he is not taking Actos currently and not clear when this was stopped Because of his significant weight loss he maybe starting to get hypoglycemic now He is a poor historian  Recommendations:  He will restart pioglitazone  Stop glipizide for now to avoid hypoglycemia  May need a lower dose of 2.5 mg again  More frequent glucose monitoring at different times  His mild hypoglycemia was treated in the office with juice and peanut butter crackers and patient improved symptomatically   2.  WEIGHT loss, weakness, orthostatic hypotension and decreased appetite: Although his cortisol was normal during his hospitalization will need to recheck this with the Cortrosyn stimulation test since he may have  had adrenal insufficiency last year, not confirm  3.  Renal insufficiency with stable renal function   Total visit time for evaluation and management of multiple problems in counseling = 25 minutes   There are no Patient Instructions on file for this visit.     Juwon Scripter 02/12/2017, 8:37 AM   Note: This office note was prepared with Dragon voice recognition system technology. Any transcriptional errors that result from this process are unintentional.

## 2017-02-12 NOTE — Patient Instructions (Addendum)
Stop Glipizide, stay on Pioglitazone  Check blood sugars on waking up  3/7 days  Also check blood sugars about 2 hours after a meal and do this after different meals by rotation  Recommended blood sugar levels on waking up is 90-130 and about 2 hours after meal is 130-160  Please bring your blood sugar monitor to each visit, thank you

## 2017-02-12 NOTE — Progress Notes (Signed)
Please call to let patient know that the lab results are normal.  He needs to pick up his pioglitazone prescription and check blood sugar at least once a day every day

## 2017-02-13 DIAGNOSIS — E43 Unspecified severe protein-calorie malnutrition: Secondary | ICD-10-CM | POA: Diagnosis not present

## 2017-02-13 DIAGNOSIS — K220 Achalasia of cardia: Secondary | ICD-10-CM | POA: Diagnosis not present

## 2017-02-13 DIAGNOSIS — I5032 Chronic diastolic (congestive) heart failure: Secondary | ICD-10-CM | POA: Diagnosis not present

## 2017-02-13 DIAGNOSIS — I130 Hypertensive heart and chronic kidney disease with heart failure and stage 1 through stage 4 chronic kidney disease, or unspecified chronic kidney disease: Secondary | ICD-10-CM | POA: Diagnosis not present

## 2017-02-13 DIAGNOSIS — N184 Chronic kidney disease, stage 4 (severe): Secondary | ICD-10-CM | POA: Diagnosis not present

## 2017-02-13 DIAGNOSIS — E1122 Type 2 diabetes mellitus with diabetic chronic kidney disease: Secondary | ICD-10-CM | POA: Diagnosis not present

## 2017-02-14 ENCOUNTER — Other Ambulatory Visit: Payer: Self-pay | Admitting: Endocrinology

## 2017-02-14 DIAGNOSIS — H5462 Unqualified visual loss, left eye, normal vision right eye: Secondary | ICD-10-CM | POA: Diagnosis not present

## 2017-02-14 DIAGNOSIS — D631 Anemia in chronic kidney disease: Secondary | ICD-10-CM | POA: Diagnosis not present

## 2017-02-14 DIAGNOSIS — K220 Achalasia of cardia: Secondary | ICD-10-CM | POA: Diagnosis not present

## 2017-02-14 DIAGNOSIS — Z7984 Long term (current) use of oral hypoglycemic drugs: Secondary | ICD-10-CM

## 2017-02-14 DIAGNOSIS — C799 Secondary malignant neoplasm of unspecified site: Secondary | ICD-10-CM | POA: Diagnosis not present

## 2017-02-14 DIAGNOSIS — Z9181 History of falling: Secondary | ICD-10-CM

## 2017-02-14 DIAGNOSIS — I130 Hypertensive heart and chronic kidney disease with heart failure and stage 1 through stage 4 chronic kidney disease, or unspecified chronic kidney disease: Secondary | ICD-10-CM | POA: Diagnosis not present

## 2017-02-14 DIAGNOSIS — Z87891 Personal history of nicotine dependence: Secondary | ICD-10-CM | POA: Diagnosis not present

## 2017-02-14 DIAGNOSIS — I951 Orthostatic hypotension: Secondary | ICD-10-CM | POA: Diagnosis not present

## 2017-02-14 DIAGNOSIS — I82502 Chronic embolism and thrombosis of unspecified deep veins of left lower extremity: Secondary | ICD-10-CM | POA: Diagnosis not present

## 2017-02-14 DIAGNOSIS — C61 Malignant neoplasm of prostate: Secondary | ICD-10-CM | POA: Diagnosis not present

## 2017-02-14 DIAGNOSIS — N184 Chronic kidney disease, stage 4 (severe): Secondary | ICD-10-CM | POA: Diagnosis not present

## 2017-02-14 DIAGNOSIS — I5032 Chronic diastolic (congestive) heart failure: Secondary | ICD-10-CM | POA: Diagnosis not present

## 2017-02-14 DIAGNOSIS — E1122 Type 2 diabetes mellitus with diabetic chronic kidney disease: Secondary | ICD-10-CM | POA: Diagnosis not present

## 2017-02-18 ENCOUNTER — Telehealth: Payer: Self-pay | Admitting: Oncology

## 2017-02-18 DIAGNOSIS — K220 Achalasia of cardia: Secondary | ICD-10-CM | POA: Diagnosis not present

## 2017-02-18 DIAGNOSIS — N184 Chronic kidney disease, stage 4 (severe): Secondary | ICD-10-CM | POA: Diagnosis not present

## 2017-02-18 DIAGNOSIS — E1122 Type 2 diabetes mellitus with diabetic chronic kidney disease: Secondary | ICD-10-CM | POA: Diagnosis not present

## 2017-02-18 DIAGNOSIS — I130 Hypertensive heart and chronic kidney disease with heart failure and stage 1 through stage 4 chronic kidney disease, or unspecified chronic kidney disease: Secondary | ICD-10-CM | POA: Diagnosis not present

## 2017-02-18 DIAGNOSIS — I5032 Chronic diastolic (congestive) heart failure: Secondary | ICD-10-CM | POA: Diagnosis not present

## 2017-02-18 DIAGNOSIS — E43 Unspecified severe protein-calorie malnutrition: Secondary | ICD-10-CM | POA: Diagnosis not present

## 2017-02-18 NOTE — Telephone Encounter (Signed)
Scheduled message per 11/7 sch msg. Spoke with patient regarding this appointment.

## 2017-02-19 DIAGNOSIS — Z8546 Personal history of malignant neoplasm of prostate: Secondary | ICD-10-CM | POA: Diagnosis not present

## 2017-02-19 DIAGNOSIS — C61 Malignant neoplasm of prostate: Secondary | ICD-10-CM | POA: Diagnosis not present

## 2017-02-19 DIAGNOSIS — R9721 Rising PSA following treatment for malignant neoplasm of prostate: Secondary | ICD-10-CM | POA: Diagnosis not present

## 2017-02-20 ENCOUNTER — Other Ambulatory Visit: Payer: Self-pay

## 2017-02-20 DIAGNOSIS — I130 Hypertensive heart and chronic kidney disease with heart failure and stage 1 through stage 4 chronic kidney disease, or unspecified chronic kidney disease: Secondary | ICD-10-CM | POA: Diagnosis not present

## 2017-02-20 DIAGNOSIS — N184 Chronic kidney disease, stage 4 (severe): Secondary | ICD-10-CM | POA: Diagnosis not present

## 2017-02-20 DIAGNOSIS — K220 Achalasia of cardia: Secondary | ICD-10-CM | POA: Diagnosis not present

## 2017-02-20 DIAGNOSIS — E43 Unspecified severe protein-calorie malnutrition: Secondary | ICD-10-CM | POA: Diagnosis not present

## 2017-02-20 DIAGNOSIS — E1122 Type 2 diabetes mellitus with diabetic chronic kidney disease: Secondary | ICD-10-CM | POA: Diagnosis not present

## 2017-02-20 DIAGNOSIS — I5032 Chronic diastolic (congestive) heart failure: Secondary | ICD-10-CM | POA: Diagnosis not present

## 2017-02-20 MED ORDER — FERROUS SULFATE 325 (65 FE) MG PO TABS: ORAL_TABLET | ORAL | 0 refills | Status: AC

## 2017-02-21 DIAGNOSIS — I5032 Chronic diastolic (congestive) heart failure: Secondary | ICD-10-CM | POA: Diagnosis not present

## 2017-02-21 DIAGNOSIS — K220 Achalasia of cardia: Secondary | ICD-10-CM | POA: Diagnosis not present

## 2017-02-21 DIAGNOSIS — N184 Chronic kidney disease, stage 4 (severe): Secondary | ICD-10-CM | POA: Diagnosis not present

## 2017-02-21 DIAGNOSIS — E43 Unspecified severe protein-calorie malnutrition: Secondary | ICD-10-CM | POA: Diagnosis not present

## 2017-02-21 DIAGNOSIS — E1122 Type 2 diabetes mellitus with diabetic chronic kidney disease: Secondary | ICD-10-CM | POA: Diagnosis not present

## 2017-02-21 DIAGNOSIS — I130 Hypertensive heart and chronic kidney disease with heart failure and stage 1 through stage 4 chronic kidney disease, or unspecified chronic kidney disease: Secondary | ICD-10-CM | POA: Diagnosis not present

## 2017-02-25 ENCOUNTER — Ambulatory Visit (HOSPITAL_BASED_OUTPATIENT_CLINIC_OR_DEPARTMENT_OTHER): Payer: Medicare Other | Admitting: Oncology

## 2017-02-25 ENCOUNTER — Telehealth: Payer: Self-pay | Admitting: Oncology

## 2017-02-25 VITALS — BP 132/78 | HR 97 | Temp 97.8°F | Resp 18 | Ht 67.5 in | Wt 119.4 lb

## 2017-02-25 DIAGNOSIS — E291 Testicular hypofunction: Secondary | ICD-10-CM | POA: Diagnosis not present

## 2017-02-25 DIAGNOSIS — C7989 Secondary malignant neoplasm of other specified sites: Secondary | ICD-10-CM

## 2017-02-25 DIAGNOSIS — I130 Hypertensive heart and chronic kidney disease with heart failure and stage 1 through stage 4 chronic kidney disease, or unspecified chronic kidney disease: Secondary | ICD-10-CM | POA: Diagnosis not present

## 2017-02-25 DIAGNOSIS — K220 Achalasia of cardia: Secondary | ICD-10-CM | POA: Diagnosis not present

## 2017-02-25 DIAGNOSIS — E1122 Type 2 diabetes mellitus with diabetic chronic kidney disease: Secondary | ICD-10-CM | POA: Diagnosis not present

## 2017-02-25 DIAGNOSIS — E119 Type 2 diabetes mellitus without complications: Secondary | ICD-10-CM | POA: Diagnosis not present

## 2017-02-25 DIAGNOSIS — N184 Chronic kidney disease, stage 4 (severe): Secondary | ICD-10-CM | POA: Diagnosis not present

## 2017-02-25 DIAGNOSIS — I5032 Chronic diastolic (congestive) heart failure: Secondary | ICD-10-CM | POA: Diagnosis not present

## 2017-02-25 DIAGNOSIS — C61 Malignant neoplasm of prostate: Secondary | ICD-10-CM | POA: Diagnosis not present

## 2017-02-25 DIAGNOSIS — E43 Unspecified severe protein-calorie malnutrition: Secondary | ICD-10-CM | POA: Diagnosis not present

## 2017-02-25 NOTE — Progress Notes (Signed)
Hematology and Oncology Follow Up Visit  [name] [id number] June 25, 1940 76 y.o. 02/25/2017 10:00 AM Elna Breslow Oletha Blend Ples Specter, FNP   Principle Diagnosis: 76 year old gentleman with prostate cancer diagnosed in 2006. He had a Gleason score 4+4 = 8.   Prior Therapy: He was treated with cryotherapy and a PSA nadir was around 0.4 in June 2017.  His PSA started to rise and a repeat biopsy in 2008 showed no active cancer detected. His staging workup did not reveal any evidence of metastatic disease at that time including CT scan and bone scan. His PSA continues to rise and was a 7.75 in June 2009. He was treated with androgen deprivation under the care of Dr. Gaynelle Arabian initially with Degarelix and subsequently with Vantas.  His PSA in 2011 was as well as 0.18. His PSA started to rise again in 2016 up to 19.57 in April 2016. In April 2017 was 31.55 and on 10/20/2015 was 43.6. Staging workup including CT scan and a bone scan did not reveal any clear-cut metastatic disease.   Imaging obtained on 11/17/2015 utilizing F-18 Fluciclovine PET was interpreted by Dr. Leonia Reeves from radiology and showed 2 soft tissue masses in the deep pelvis adjacent to the rectum with intense amino acid radiotracer uptake consistent with prostate cancer recurrence. These masses measuring 2.8 x 2.5 cm   Zytiga 1000 mg daily started in September 2017.  Therapy discontinued because of poor tolerance.  Current therapy: Xtandi 160 mg daily started in August 2018.  Interim History: [name] presents today for a follow-up visit. Since the last visit, he has missed his follow-up appointment in the last year and a half.  In the meantime he discontinued Zytiga and started [place] under the care of Dr. Gaynelle Arabian.  He was hospitalized in October 2018 and underwent staging workup which showed slight progression of his disease that is predominantly with pelvic nodules.  He remains on the [place] without any  complications at this time.  He continues to have issues with weight loss and dysphagia.  Despite GI workup and multiple interventions, he continues to struggle with his nutrition.  He still able to ambulate without any difficulties and has no new symptoms.  He does not report any headaches, blurry vision, syncope or seizures. He does not report any fevers or chills or sweats. He does not report any cough, wheezing or hemoptysis. He does not report any nausea, vomiting, abdominal pain, hematochezia or melena. He does not report any frequency urgency or hesitancy. He does not report any hematuria or dysuria. He does not report any skeletal complaints. Remaining review of systems unremarkable.   Medications: I have reviewed the patient's current medications.  Current Outpatient Medications  Medication Sig Dispense Refill  . ACCU-CHEK FASTCLIX LANCETS MISC Use to check blood sugars twice daily E11.65 180 each 0  . ACCU-CHEK FASTCLIX LANCETS MISC USE TO CHECK BLOOD SUGARS TWICE DAILY E11.65 204 each 0  . Amino Acids-Protein Hydrolys (FEEDING SUPPLEMENT, PRO-STAT SUGAR FREE 64,) LIQD Take 30 mLs by mouth 2 (two) times daily. (Patient taking differently: Take 30 mLs by mouth daily. ) 900 mL 0  . atenolol (TENORMIN) 25 MG tablet TAKE 1 TABLET DAILY 90 tablet 1  . atorvastatin (LIPITOR) 10 MG tablet Take 1 tablet (10 mg total) by mouth at bedtime. 90 tablet 1  . docusate sodium (COLACE) 100 MG capsule TAKE ONE CAPSULE BY MOUTH TWICE A DAY FOR CONSTIPATION (Patient taking differently: TAKE ONE CAPSULE BY MOUTH TWICE A DAY)  60 capsule 0  . feeding supplement, ENSURE ENLIVE, (ENSURE ENLIVE) LIQD Take 237 mLs by mouth 2 (two) times daily between meals. 237 mL 12  . ferrous sulfate 325 (65 FE) MG tablet TAKE 1 TABLET BY MOUTH EVERY DAY FOR SUPPLEMENT 90 tablet 0  . glucose blood test strip Use as instructed 100 each 12  . metoprolol tartrate (LOPRESSOR) 25 MG tablet Take 0.5 tablets (12.5 mg total) by mouth 2  (two) times daily. 60 tablet 0  . Multiple Vitamin (MULTIVITAMIN WITH MINERALS) TABS tablet Take 1 tablet by mouth daily.    . ondansetron (ZOFRAN-ODT) 4 MG disintegrating tablet Take 1 tablet (4 mg total) by mouth every 8 (eight) hours as needed for nausea or vomiting. 20 tablet 0  . pantoprazole (PROTONIX) 40 MG tablet Take 1 tablet (40 mg total) by mouth 2 (two) times daily. 180 tablet 1  . pantoprazole (PROTONIX) 40 MG tablet TAKE 1 TABLET TWICE A DAY 180 tablet 1  . pioglitazone (ACTOS) 15 MG tablet Take 1 tablet (15 mg total) daily by mouth. (Patient not taking: Reported on 02/12/2017) 90 tablet 2  . TRADJENTA 5 MG TABS tablet TAKE 1 TABLET (5 MG TOTAL) BY MOUTH DAILY. 90 tablet 0   No current facility-administered medications for this visit.      Allergies: No Known Allergies  Past Medical History, Surgical history, Social history, and Family History were reviewed and updated.   Physical Exam: Blood pressure 132/78, pulse 97, temperature 97.8 F (36.6 C), temperature source Oral, resp. rate 18, height 5' 7.5" (1.715 m), weight 119 lb 6.4 oz (54.2 kg), SpO2 100 %. ECOG: 1 General appearance: alert and cooperative appeared without distress. Head: Normocephalic, without obvious abnormality Neck: no adenopathy Lymph nodes: Cervical, supraclavicular, and axillary nodes normal. Heart:regular rate and rhythm, S1, S2 normal, no murmur, click, rub or gallop Lung:chest clear, no wheezing, rales, normal symmetric air entry.  Abdomin: soft, non-tender, without masses or organomegaly no shifting dullness or ascites. EXT:no erythema, induration, or nodules   Lab Results: Lab Results  Component Value Date   WBC 6.4 01/29/2017   HGB 9.0 (L) 01/29/2017   HCT 28.2 (L) 01/29/2017   MCV 99.7 01/29/2017   PLT 339.0 01/29/2017     Chemistry      Component Value Date/Time   NA 149 (H) 02/07/2017 0823   NA 146 (H) 01/25/2016 0912   K 3.5 02/07/2017 0823   K 3.2 (L) 01/25/2016 0912   CL  109 02/07/2017 0823   CO2 22 02/07/2017 0823   CO2 27 01/25/2016 0912   BUN 41 (H) 02/07/2017 0823   BUN 23.0 01/25/2016 0912   CREATININE 2.34 (H) 02/07/2017 0823   CREATININE 2.8 (H) 01/25/2016 0912      Component Value Date/Time   CALCIUM 9.4 02/07/2017 0823   CALCIUM 9.0 01/25/2016 0912   ALKPHOS 89 01/29/2017 1402   ALKPHOS 140 01/25/2016 0912   AST 20 01/29/2017 1402   AST 65 (H) 01/25/2016 0912   ALT 10 01/29/2017 1402   ALT 29 01/25/2016 0912   BILITOT 0.5 01/29/2017 1402   BILITOT 0.53 01/25/2016 0912     Results for [name], [name] (MRN [id number]) as of 02/25/2017 09:07  Ref. Range 01/24/2017 07:08  Prostatic Specific Antigen Latest Ref Range: 0.00 - 4.00 ng/mL 109.26 (H)    EXAM: CT CHEST, ABDOMEN AND PELVIS WITHOUT CONTRAST  TECHNIQUE: Multidetector CT imaging of the chest, abdomen and pelvis was performed following the standard protocol without IV  contrast.  COMPARISON:  CT abdomen and pelvis from 11/07/2015, PET CT 11/17/2015  FINDINGS: CT CHEST FINDINGS  Cardiovascular: The heart size is normal with coronary arteriosclerosis along the LAD, RCA and circumflex. No aortic aneurysm. Minimal aortic atherosclerosis at the arch and along the descending portion. The unenhanced pulmonary vasculature is unremarkable.  Mediastinum/Nodes: Calcified right lower paratracheal lymph nodes are noted without adenopathy. No axillary, mediastinal or hilar lymphadenopathy given limitations of a noncontrast study. Marked contrast distention of the esophagus tapering at the GE junction consistent with achalasia. Contrast is noted within the stomach suggesting patency of the GE junction.  Lungs/Pleura: 2.1 cm thin walled cavitation is relatively stable in appearance relative to PET-CT noted within the right lower lobe. No effusion or pneumothorax. No pneumonic consolidation. Scarring is seen at the left lung apex.  Musculoskeletal: No chest wall mass or  suspicious bone lesions identified.  CT ABDOMEN PELVIS FINDINGS  Hepatobiliary: No focal liver abnormality is seen. No gallstones, gallbladder wall thickening, or biliary dilatation. The gallbladder appears contracted.  Pancreas: No ductal dilatation. Pancreatic tail calcification may reflect stigmata of chronic pancreatitis.  Spleen: Normal size without mass.  Adrenals/Urinary Tract: Chronic thickening of the left adrenal gland without focal mass. The right adrenal gland is unremarkable. Bilateral renal cysts are noted without evidence of obstructive uropathy. No hydroureteronephrosis. The urinary bladder is physiologically distended.  Stomach/Bowel: Contrast reaches the rectum. There is colonic diverticulosis without acute diverticulitis. Normal small bowel rotation without obstruction or inflammation. Normal appendix.  Vascular/Lymphatic: Mild aortoiliac atherosclerosis without aneurysm. No lymphadenopathy.  Reproductive: Enlarged prostate with coarse peripheral zone calcifications are again identified. Presacral, perirectal homogeneous soft tissue masses are again visualized which appear to have increased in size from prior PET-CT, the more anterior and left-sided mass measuring 5 x 2.1 cm, series 2, image 106 versus 2.5 x 2.8 cm previously and the more midline posterior presacral mass is estimated at 3.2 x 4.3 cm versus 2.9 x 2.6 cm, as measured on series 2, image 109.  Other: No free air nor free fluid.  Musculoskeletal: Subchondral cystic change of the right acetabulum, left iliac bone adjacent to the SI joint with patchy osteopenic appearance of the iliac bone. No definite osteoblastic disease.  IMPRESSION: 1. Stable 2.1 cm thin walled cavitation in the right lower lobe. 2. Marked contrast distention of the esophagus consistent with achalasia. 3. Interval increase in size of presacral soft tissue masses now estimated at 5 x 2.1 cm versus 2.5 x 2.8 cm  and 3.2 x 4.3 cm versus 2.9 x 2.6 cm despite reported systemic therapy for recurrence of prostate cancer. 4. Stable thickening of the left adrenal gland with right-sided water attenuating renal cysts.     Impression and Plan:  76 year old gentleman with the following issues:  1. 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 His initial diagnosis was 2006 had a stage TIc and a Gleason score 4+4 = 8. He is status post cryotherapy in a fairly and subsequently treated with androgen deprivation and currently on Vantas. His PSA has started to rise in the last year and was up to 43.6 on July 2017.  He is currently on Xtandi 160 mg daily without complications.  His PSA is 109.26 on January 24, 2017.  His CT scan showed slight progression of disease.  Options of therapy were reviewed today which include therapy with radiation versus systemic chemotherapy.  At this time, he is not a candidate to proceed with  any additional therapy until his nutritional status is improved.  I recommended continuing Xtandi for the time being continue to monitor him closely.  We can consider different salvage therapy if he develops symptoms related to his cancer and if his nutritional status remains stable.  2. Androgen deprivation: He will continue that under the care of Dr. Gaynelle Arabian. He has currently Vantas implant.  3. Bone directed therapy: He has no evidence of bony metastasis at this time.  4. Diabetes: Under the care of Dr. Dwyane Dee.   5. Hypokalemia: This has resolved at this time.  6. Follow-up: Will be 8 weeks to follow his progress.   Zola Button, MD 11/19/201810:00 AM

## 2017-02-25 NOTE — Telephone Encounter (Signed)
Scheduled appt per 11/19 los - Gave patient AVS and calender per los. - patietn prefers 1/23 rather than 1/24

## 2017-02-27 ENCOUNTER — Telehealth: Payer: Self-pay | Admitting: Family

## 2017-02-27 DIAGNOSIS — I5032 Chronic diastolic (congestive) heart failure: Secondary | ICD-10-CM | POA: Diagnosis not present

## 2017-02-27 DIAGNOSIS — D631 Anemia in chronic kidney disease: Secondary | ICD-10-CM | POA: Diagnosis not present

## 2017-02-27 DIAGNOSIS — N184 Chronic kidney disease, stage 4 (severe): Secondary | ICD-10-CM | POA: Diagnosis not present

## 2017-02-27 DIAGNOSIS — C61 Malignant neoplasm of prostate: Secondary | ICD-10-CM | POA: Diagnosis not present

## 2017-02-27 DIAGNOSIS — I129 Hypertensive chronic kidney disease with stage 1 through stage 4 chronic kidney disease, or unspecified chronic kidney disease: Secondary | ICD-10-CM | POA: Diagnosis not present

## 2017-02-27 DIAGNOSIS — E8770 Fluid overload, unspecified: Secondary | ICD-10-CM | POA: Diagnosis not present

## 2017-02-27 DIAGNOSIS — K220 Achalasia of cardia: Secondary | ICD-10-CM | POA: Diagnosis not present

## 2017-02-27 DIAGNOSIS — E43 Unspecified severe protein-calorie malnutrition: Secondary | ICD-10-CM | POA: Diagnosis not present

## 2017-02-27 DIAGNOSIS — E1122 Type 2 diabetes mellitus with diabetic chronic kidney disease: Secondary | ICD-10-CM | POA: Diagnosis not present

## 2017-02-27 DIAGNOSIS — R634 Abnormal weight loss: Secondary | ICD-10-CM | POA: Diagnosis not present

## 2017-02-27 DIAGNOSIS — I130 Hypertensive heart and chronic kidney disease with heart failure and stage 1 through stage 4 chronic kidney disease, or unspecified chronic kidney disease: Secondary | ICD-10-CM | POA: Diagnosis not present

## 2017-02-27 DIAGNOSIS — N183 Chronic kidney disease, stage 3 (moderate): Secondary | ICD-10-CM | POA: Diagnosis not present

## 2017-02-27 NOTE — Telephone Encounter (Addendum)
Pt came by states his grand daughter dropped off handicap placard app for him in Oct 2018. Unable to locate app it is not here. Printed new [employer] handicap placard for pt and left for Dr Jenness Corner. Pt PCP was [facility] and pt has appt with Brien Few to establish in Dec. Please call pt when form filled out and ready for pick up . Pt ph [phone]. Placed form in provider tray at front office checkin/ck out area.

## 2017-03-04 ENCOUNTER — Telehealth: Payer: Self-pay | Admitting: Emergency Medicine

## 2017-03-04 ENCOUNTER — Encounter: Payer: Self-pay | Admitting: Internal Medicine

## 2017-03-04 NOTE — Telephone Encounter (Signed)
LVM with pt advising handicap for is ready for pick-up

## 2017-03-07 ENCOUNTER — Encounter: Payer: Self-pay | Admitting: Gastroenterology

## 2017-03-07 ENCOUNTER — Ambulatory Visit (INDEPENDENT_AMBULATORY_CARE_PROVIDER_SITE_OTHER): Payer: Medicare Other | Admitting: Gastroenterology

## 2017-03-07 ENCOUNTER — Ambulatory Visit: Payer: Medicare Other | Admitting: Gastroenterology

## 2017-03-07 VITALS — BP 104/72 | HR 113 | Ht 68.0 in | Wt 122.2 lb

## 2017-03-07 DIAGNOSIS — R1310 Dysphagia, unspecified: Secondary | ICD-10-CM | POA: Diagnosis not present

## 2017-03-07 DIAGNOSIS — K220 Achalasia of cardia: Secondary | ICD-10-CM | POA: Diagnosis not present

## 2017-03-07 DIAGNOSIS — R1319 Other dysphagia: Secondary | ICD-10-CM

## 2017-03-07 NOTE — Progress Notes (Signed)
[name]    [id number]    April 21, 1940  Primary Care Physician:Shambley, Delphia Grates, NP  Referring Physician: [facility], [address], [place] [address]  Chief complaint: Achalasia  HPI: 76 yr M with history of achalasia here to discuss treatment options He has lost over 30 pounds within the past 1 year, currently tolerating full liquids and soft foods. Patient has had multiple EGDs and was previously followed by Dr. Sharlett Iles, then Dr. Henrene Pastor and was last seen by Dr. Ardis Hughs in office May 22, 2016.  Most recent EGD by Dr. Havery Moros January 02, 2017 with dilation to 20 mm followed by Botox injection and EG junction.   Denies any nausea, vomiting, abdominal pain, melena or bright red blood per rectum      Outpatient Encounter Medications as of 03/07/2017  Medication Sig  . ACCU-CHEK FASTCLIX LANCETS MISC Use to check blood sugars twice daily E11.65  . ACCU-CHEK FASTCLIX LANCETS MISC USE TO CHECK BLOOD SUGARS TWICE DAILY E11.65  . Amino Acids-Protein Hydrolys (FEEDING SUPPLEMENT, PRO-STAT SUGAR FREE 64,) LIQD Take 30 mLs by mouth 2 (two) times daily. (Patient taking differently: Take 30 mLs by mouth daily. )  . atenolol (TENORMIN) 25 MG tablet TAKE 1 TABLET DAILY (Patient not taking: Reported on 03/07/2017)  . atorvastatin (LIPITOR) 10 MG tablet Take 1 tablet (10 mg total) by mouth at bedtime.  . docusate sodium (COLACE) 100 MG capsule TAKE ONE CAPSULE BY MOUTH TWICE A DAY FOR CONSTIPATION (Patient taking differently: TAKE ONE CAPSULE BY MOUTH TWICE A DAY)  . feeding supplement, ENSURE ENLIVE, (ENSURE ENLIVE) LIQD Take 237 mLs by mouth 2 (two) times daily between meals.  . ferrous sulfate 325 (65 FE) MG tablet TAKE 1 TABLET BY MOUTH EVERY DAY FOR SUPPLEMENT  . glucose blood test strip Use as instructed  . metoprolol tartrate (LOPRESSOR) 25 MG tablet Take 0.5 tablets (12.5 mg total) by mouth 2 (two) times daily.  . Multiple  Vitamin (MULTIVITAMIN WITH MINERALS) TABS tablet Take 1 tablet by mouth daily.  . ondansetron (ZOFRAN-ODT) 4 MG disintegrating tablet Take 1 tablet (4 mg total) by mouth every 8 (eight) hours as needed for nausea or vomiting.  . pantoprazole (PROTONIX) 40 MG tablet TAKE 1 TABLET TWICE A DAY  . pioglitazone (ACTOS) 15 MG tablet Take 1 tablet (15 mg total) daily by mouth. (Patient not taking: Reported on 02/12/2017)  . TRADJENTA 5 MG TABS tablet TAKE 1 TABLET (5 MG TOTAL) BY MOUTH DAILY.  . [DISCONTINUED] pantoprazole (PROTONIX) 40 MG tablet Take 1 tablet (40 mg total) by mouth 2 (two) times daily.   No facility-administered encounter medications on file as of 03/07/2017.     Allergies as of 03/07/2017  . (No Known Allergies)    Past Medical History:  Diagnosis Date  . Anemia    IRON TRANSFUSION 1 MONTH AGO  . Blood clot in vein 2008   LEFT LEG  . Cataract    LEFT  . CKD (chronic kidney disease) 02/2016  . Diabetes mellitus, type 2 ([place]) 2008  . GERD (gastroesophageal reflux disease)   . Hyperlipidemia 2008  . Hypertension 2008  . Pneumonia 01/18/2016  . Prostate cancer ([place]) 2006   IMPLANT IN LEFT ARM FOR [place]    Past Surgical History:  Procedure Laterality Date  . BALLOON DILATION N/A 04/13/2016   Procedure: BALLOON DILATION;  Surgeon: Gatha Mayer, MD;  Location: [facility];  Service: Endoscopy;  Laterality: N/A;  . BOTOX INJECTION N/A 04/13/2016   Procedure: BOTOX INJECTION;  Surgeon: Gatha Mayer, MD;  Location: [facility];  Service: Endoscopy;  Laterality: N/A;  . BOTOX INJECTION N/A 01/02/2017   Procedure: BOTOX INJECTION;  Surgeon: Yetta Flock, MD;  Location: [facility];  Service: Gastroenterology;  Laterality: N/A;  . COLONOSCOPY W/ POLYPECTOMY  03/2008   diminutive rectal polyp (path: prolapse type polyp, not adenoma).  moderate pan-diverticulitis.   Marland Kitchen ESOPHAGEAL MANOMETRY  1984   unable to pass manometry catheter beyond LES. Aperiystalsis of esophagus:  sugg of achalasia.   Marland Kitchen ESOPHAGOGASTRODUODENOSCOPY N/A 02/17/2016   Procedure: ESOPHAGOGASTRODUODENOSCOPY (EGD);  Surgeon: [name], MD;  Location: [facility];  Service: Endoscopy;  Laterality: N/A;  . ESOPHAGOGASTRODUODENOSCOPY (EGD) WITH PROPOFOL N/A 02/21/2016   Procedure: ESOPHAGOGASTRODUODENOSCOPY (EGD) WITH PROPOFOL;  Surgeon: Jerene Bears, MD;  [facility]: [facility];  Service: Gastroenterology;  Laterality: N/A;  . ESOPHAGOGASTRODUODENOSCOPY (EGD) WITH PROPOFOL N/A 02/28/2016   Procedure: ESOPHAGOGASTRODUODENOSCOPY (EGD) WITH PROPOFOL withBOTOX;  Surgeon: Manus Gunning, MD;  [facility];  Service: Gastroenterology;  Laterality: N/A;  . ESOPHAGOGASTRODUODENOSCOPY (EGD) WITH PROPOFOL N/A 04/13/2016   Procedure: ESOPHAGOGASTRODUODENOSCOPY (EGD) WITH PROPOFOL;  Surgeon: Gatha Mayer, MD;  Location: [facility];  Service: Endoscopy;  Laterality: N/A;  . ESOPHAGOGASTRODUODENOSCOPY (EGD) WITH PROPOFOL N/A 01/02/2017   Procedure: ESOPHAGOGASTRODUODENOSCOPY (EGD) WITH PROPOFOL;  Surgeon: Yetta Flock, MD;  [facility]: [facility];  Service: Gastroenterology;  Laterality: N/A;  . LEFT ARM IMPLANT X2     RADIATION TX ALSO    Family History  Problem Relation Age of Onset  . Diabetes Father   . Diabetes Sister   . Hypertension Sister   . Hypertension Brother   . Heart disease Neg Hx   . Colon cancer Neg Hx   . Stomach cancer Neg Hx     Social History   Socioeconomic History  . Marital status: Married    Spouse name: Not on file  . Number of children: 2  . Years of education: 12  . Highest education level: Not on file  Social Needs  . Financial resource strain: Not on file  . Food insecurity - worry: Not on file  . Food insecurity - inability: Not on file  . Transportation needs - medical: Not on file  . Transportation needs - non-medical: Not on file  Occupational History  . Occupation: retired  Tobacco Use  . Smoking status: Former Smoker     Years: 10.00    Last attempt to quit: 2000    Years since quitting: 18.9  . Smokeless tobacco: Never Used  Substance and Sexual Activity  . Alcohol use: No  . Drug use: No  . Sexual activity: No  Other Topics Concern  . Not on file  Social History Narrative   Fun/Hobby: Football      Review of systems: Review of Systems  Constitutional: Negative for fever and chills. Positive for lack of energy and weight loss HENT: Negative.   Eyes: Negative for blurred vision.  Respiratory: Negative for cough, shortness of breath and wheezing.   Cardiovascular: Negative for chest pain and palpitations.  Gastrointestinal: as per HPI Genitourinary: Negative for dysuria, urgency, frequency and hematuria.  Musculoskeletal: Negative for myalgias, back pain and joint pain.  Skin: Negative for itching and rash.  Neurological: Negative for dizziness, tremors, focal weakness, seizures and loss of consciousness.  Endo/Heme/Allergies: Positive for seasonal allergies.  Psychiatric/Behavioral: Negative for depression, suicidal ideas and  hallucinations.  All other systems reviewed and are negative.   Physical Exam: Vitals:   03/07/17 0826  BP: 104/72  Pulse: (!) 113   Body mass index is 18.58 kg/m. Gen:      No acute distress HEENT:  EOMI, sclera anicteric Neck:     No masses; no thyromegaly Lungs:    Clear to auscultation bilaterally; normal respiratory effort CV:         Regular rate and rhythm; no murmurs Abd:      + bowel sounds; soft, non-tender; no palpable masses, no distension Ext:    No edema; adequate peripheral perfusion Skin:      Warm and dry; no rash Neuro: alert and oriented x 3 Psych: normal mood and affect  Data Reviewed:  Reviewed labs, radiology imaging, old records and pertinent past GI work up  02/17/2016 EGD. Dr. Ardis Hughs, for anemia, FOBT positive stool. Very dilated, fluid-filled esophagus cleared with cough and at, lavage, suctioning following airway intubation.  Suspect this is due to achalasia. Mucosa at GE junction was abnormal, the lumen was narrowed. Biopsies obtained and showed reactive changes/inflammation.. 02/21/16 EGD. Dr. Hilarie Fredrickson. Esophageal lumen severely dilated with pooled liquid and debris including medication and pills. 500 mL of fluid aspirated. Severe esophagitis in the middle and lower third of the esophagus and at the GE junction. Gastritis also noted. Due to patient having hypotension during the procedure and possibly a developing sepsis, plans for Botox to the LES was aborted. 02/28/16 EGD. Dr. Havery Moros. Upper and middle esophagus dilated with residual liquid contents requiring suctioning. Esophagitis appeared to be improved. Large amount of food residue noted in the stomach. Botox was injected to the LES. 04/13/2016 EGD with Dr. Carlean Purl: dilated esophagus, fluid and food within, cleared. GE junction dilated with TTS balloon to 36mm and then botox injected again; recommended strict liquid diet, consider PEG in future 01/02/2017 EGD Dr Havery Moros - Dilation in the entire esophagus consistent with achalasia. - Hypertonic LES. Dilated to 53mm, injected with 100 units of Botox. - Esophagitis due to stasis. Biopsied. - Multiple gastric polyps. Biopsied. - Normal duodenal bulb and second portion of the duodenum.  Barium esophagram 04/2016 The patient swallowed several mouthfuls of water-soluble contrast. The contrast pooled in the distal esophagus with esophageal distention and no emptying into the stomach. No further imaging was obtained due to lack of emptying into the stomach.  IMPRESSION: Distal esophageal obstruction with no emptying into the stomach. The findings are likely due to the patient's reported history of achalasia.  Assessment and Plan/Recommendations:  76 year old male with history of chronic dysphagia, dilated esophagus with tight EG junction consistent with achalasia Status post multiple EGDs and dilations, most  recent EGD September 2018 with dilation and Botox injection.  Patient reports no significant improvement He is currently tolerating liquids and some soft diet Discussed in detail the benefits and risks associated with pneumatic balloon dilation, patient and his wife agreed to proceed with it Patient will need general anesthesia with elective intubation for the procedure given dilated esophagus with retained secretions secondary to achalasia.  He will be scheduled at [facility] endoscopy unit Return 2-4 weeks after the procedure    K. Denzil Magnuson , MD [phone] Mon-Fri 8a-5p [phone] after 5p, weekends, holidays  CC: [facility], FNP

## 2017-03-07 NOTE — Patient Instructions (Signed)
You have been scheduled for a Endoscopy at [facility] on 04/22/2017 at 9:30am, you will need to be at [facility] at [place] instructions given  You have been scheduled for a Barium Esophogram/Water at [facility] (1st floor of the hospital) on 04/22/2017 at 1:30pm. Please arrive 15 minutes prior to your appointment for registration. Make certain not to have anything to eat or drink 6 hours prior to your test. If you need to reschedule for any reason, please contact radiology at [phone] to do so. __________________________________________________________________

## 2017-03-20 ENCOUNTER — Other Ambulatory Visit (INDEPENDENT_AMBULATORY_CARE_PROVIDER_SITE_OTHER): Payer: Medicare Other

## 2017-03-20 ENCOUNTER — Ambulatory Visit (INDEPENDENT_AMBULATORY_CARE_PROVIDER_SITE_OTHER): Payer: Medicare Other | Admitting: Nurse Practitioner

## 2017-03-20 ENCOUNTER — Encounter: Payer: Self-pay | Admitting: Nurse Practitioner

## 2017-03-20 VITALS — BP 142/84 | HR 85 | Temp 97.7°F | Resp 16 | Ht 68.0 in | Wt 128.8 lb

## 2017-03-20 DIAGNOSIS — E860 Dehydration: Secondary | ICD-10-CM | POA: Diagnosis not present

## 2017-03-20 DIAGNOSIS — I10 Essential (primary) hypertension: Secondary | ICD-10-CM

## 2017-03-20 DIAGNOSIS — N183 Chronic kidney disease, stage 3 (moderate): Secondary | ICD-10-CM | POA: Diagnosis not present

## 2017-03-20 DIAGNOSIS — E1122 Type 2 diabetes mellitus with diabetic chronic kidney disease: Secondary | ICD-10-CM

## 2017-03-20 LAB — COMPREHENSIVE METABOLIC PANEL
ALBUMIN: 2.9 g/dL — AB (ref 3.5–5.2)
ALK PHOS: 65 U/L (ref 39–117)
ALT: 7 U/L (ref 0–53)
AST: 18 U/L (ref 0–37)
BUN: 36 mg/dL — AB (ref 6–23)
CO2: 24 mEq/L (ref 19–32)
Calcium: 8.6 mg/dL (ref 8.4–10.5)
Chloride: 106 mEq/L (ref 96–112)
Creatinine, Ser: 1.84 mg/dL — ABNORMAL HIGH (ref 0.40–1.50)
GFR: 46.22 mL/min — ABNORMAL LOW (ref >60.00)
Glucose, Bld: 163 mg/dL — ABNORMAL HIGH (ref 70–99)
POTASSIUM: 4.3 meq/L (ref 3.5–5.1)
SODIUM: 141 meq/L (ref 135–145)
TOTAL PROTEIN: 5.7 g/dL — AB (ref 6.0–8.3)
Total Bilirubin: 0.4 mg/dL (ref 0.2–1.2)
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
## 2017-03-20 NOTE — Patient Instructions (Addendum)
Please head downstairs for lab work.  Please take Metoprolol 25 mg (1/2 tablet) twice daily for your blood pressure. STOP atenolol.  Id like to see you back in about 6 months, or sooner if you need me.  It was nice to meet you. Thanks for letting me take care of you today :)

## 2017-03-21 ENCOUNTER — Ambulatory Visit (INDEPENDENT_AMBULATORY_CARE_PROVIDER_SITE_OTHER): Payer: Medicare Other | Admitting: Endocrinology

## 2017-03-21 ENCOUNTER — Encounter: Payer: Self-pay | Admitting: Endocrinology

## 2017-03-21 ENCOUNTER — Other Ambulatory Visit: Payer: Self-pay | Admitting: Endocrinology

## 2017-03-21 VITALS — BP 134/82 | HR 78 | Ht 68.0 in | Wt 128.4 lb

## 2017-03-21 DIAGNOSIS — E1165 Type 2 diabetes mellitus with hyperglycemia: Secondary | ICD-10-CM | POA: Diagnosis not present

## 2017-03-21 NOTE — Progress Notes (Signed)
Patient ID: [name], male   DOB: 1940/10/27, 76 y.o.   MRN: [id number]   Reason for Appointment : Followup  History of Present Illness          Diagnosis: Type 2 diabetes mellitus, date of diagnosis:   2001     Past history: He has had long-standing diabetes treated with various oral hypoglycemic drugs.  He has not been on metformin because of renal dysfunction He thinks he was given Actos for sometime but not clear if he was benefiting. This was stopped probably in 2012 but he does not think he had any side effects. He thinks he has been taking Tradjenta for a couple of years  His A1c has been monitored about once or twice a year as per records  Because of worsening A1c in 12/14 he was referred for further evaluation. Because of baseline A1c of 8.6% in 12/14 he was started on glipizide ER 5 mg and pioglitazone 30 mg daily in addition to his Tradjenta He was also started on home glucose monitoring with a One Touch monitor  Recent history:   He is taking a 3 drug regimen of Tradjenta and  Actos 15 mg   His A1c is lower than expected at 5.8, previously has been ranging from 5.9 up to 7.2  juices such as grape or apple juice    Current management, blood sugar patterns and problems identified:  He thinks he has been taking his Actos as prescribed on the last visit, glipizide was stopped because he had sporadic hypoglycemia including in the office on his visit in November  He has checked his blood sugars mostly at breakfast and bedtime  Most of his blood sugars are high night because of his drinking a lot of juices including apple juice; he says that he keeps juice in his refrigerator at the side of his bed  Occasionally fasting blood sugars are better and as low as 127 but they are still mildly increased  No significant edema with taking Actos  He says he is feeling better and his able to wait some more than usual and also supplement with Ensure, weight has  increased  Glucose monitoring:   using a  One Touch ultra 2 monitor which was reviewed today  Mean values apply above for all meters except median for One Touch  PRE-MEAL Fasting Lunch Dinner Bedtime Overall  Glucose range: 1 27-264    1 69-298  127-298   Mean/median: 162    189  188   Hypoglycemia: None    Self-care: The diet that the patient has been following is: None He does drink Ensure  Exercise: walks periodically indoors now  Dietician visit:  10/04/16.            Weight history:  Wt Readings from Last 3 Encounters:  03/21/17 128 lb 6.4 oz (58.2 kg)  03/20/17 128 lb 12.8 oz (58.4 kg)  03/07/17 122 lb 3.2 oz (55.4 kg)   Glycemic control:   Lab Results  Component Value Date   HGBA1C 5.8 02/07/2017   HGBA1C 7.2 (H) 11/08/2016   HGBA1C 6.8 (H) 08/24/2016   Lab Results  Component Value Date   MICROALBUR 2.5 (H) 06/14/2016   LDLCALC 63 08/24/2016   CREATININE 1.84 (H) 03/20/2017    Lab Results  Component Value Date   FRUCTOSAMINE 333 (H) 11/08/2016   FRUCTOSAMINE 401 (H) 10/04/2016   FRUCTOSAMINE 335 (H) 06/11/2016   FRUCTOSAMINE 299 (H) 12/23/2014  Allergies as of 03/21/2017   No Known Allergies     Medication List        Accurate as of 03/21/17  9:06 AM. Always use your most recent med list.          ACCU-CHEK FASTCLIX LANCETS Misc Use to check blood sugars twice daily E11.65   ACCU-CHEK FASTCLIX LANCETS Misc USE TO CHECK BLOOD SUGARS TWICE DAILY E11.65   atorvastatin 10 MG tablet Commonly known as:  LIPITOR Take 1 tablet (10 mg total) by mouth at bedtime.   docusate sodium 100 MG capsule Commonly known as:  COLACE TAKE ONE CAPSULE BY MOUTH TWICE A DAY FOR CONSTIPATION   feeding supplement (ENSURE ENLIVE) Liqd Take 237 mLs by mouth 2 (two) times daily between meals.   feeding supplement (PRO-STAT SUGAR FREE 64) Liqd Take 30 mLs by mouth 2 (two) times daily.   ferrous sulfate 325 (65 FE) MG tablet TAKE 1 TABLET BY MOUTH EVERY  DAY FOR SUPPLEMENT   glucose blood test strip Use as instructed   metoprolol tartrate 25 MG tablet Commonly known as:  LOPRESSOR Take 0.5 tablets (12.5 mg total) by mouth 2 (two) times daily.   multivitamin with minerals Tabs tablet Take 1 tablet by mouth daily.   ondansetron 4 MG disintegrating tablet Commonly known as:  ZOFRAN-ODT Take 1 tablet (4 mg total) by mouth every 8 (eight) hours as needed for nausea or vomiting.   pantoprazole 40 MG tablet Commonly known as:  PROTONIX TAKE 1 TABLET TWICE A DAY   pioglitazone 15 MG tablet Commonly known as:  ACTOS Take 1 tablet (15 mg total) daily by mouth.   TRADJENTA 5 MG Tabs tablet Generic drug:  linagliptin TAKE 1 TABLET (5 MG TOTAL) BY MOUTH DAILY.   XTANDI 40 MG capsule Generic drug:  enzalutamide Take 160 mg by mouth daily.       Allergies: No Known Allergies  Past Medical History:  Diagnosis Date  . Anemia    IRON TRANSFUSION 1 MONTH AGO  . Blood clot in vein 2008   LEFT LEG  . Cataract    LEFT  . CKD (chronic kidney disease) 02/2016  . Diabetes mellitus, type 2 ([place]) 2008  . GERD (gastroesophageal reflux disease)   . Hyperlipidemia 2008  . Hypertension 2008  . Pneumonia 01/18/2016  . Prostate cancer ([place]) 2006   IMPLANT IN LEFT ARM FOR [place]    Past Surgical History:  Procedure Laterality Date  . BALLOON DILATION N/A 04/13/2016   Procedure: BALLOON DILATION;  Surgeon: Gatha Mayer, MD;  Location: [facility] ENDOSCOPY;  Service: Endoscopy;  Laterality: N/A;  . BOTOX INJECTION N/A 04/13/2016   Procedure: BOTOX INJECTION;  Surgeon: Gatha Mayer, MD;  Location: [facility];  Service: Endoscopy;  Laterality: N/A;  . BOTOX INJECTION N/A 01/02/2017   Procedure: BOTOX INJECTION;  Surgeon: Yetta Flock, MD;  Location: [facility] ENDOSCOPY;  Service: Gastroenterology;  Laterality: N/A;  . COLONOSCOPY W/ POLYPECTOMY  03/2008   diminutive rectal polyp (path: prolapse type polyp, not adenoma).  moderate pan-diverticulitis.    [facility] ESOPHAGEAL MANOMETRY  1984   unable to pass manometry catheter beyond LES. Aperiystalsis of esophagus: sugg of achalasia.   [facility] Kitchen ESOPHAGOGASTRODUODENOSCOPY N/A 02/17/2016   Procedure: ESOPHAGOGASTRODUODENOSCOPY (EGD);  Surgeon: [name], MD;  Location: [facility]OS[facility];  Service: Endoscopy;  Laterality: N/A;  . ESOPHAGOGASTRODUODENOSCOPY (EGD) WITH PROPOFOL N/A 02/21/2016   Procedure: ESOPHAGOGASTRODUODENOSCOPY (EGD) WITH PROPOFOL;  Surgeon: Jerene Bears, MD;  Location: [facility] ENDOSCOPY;  Service: Gastroenterology;  Laterality: N/A;  . ESOPHAGOGASTRODUODENOSCOPY (EGD) WITH PROPOFOL N/A 02/28/2016   Procedure: ESOPHAGOGASTRODUODENOSCOPY (EGD) WITH PROPOFOL withBOTOX;  Surgeon: Manus Gunning, MD;  Location: [facility] ENDOSCOPY;  Service: Gastroenterology;  Laterality: N/A;  . ESOPHAGOGASTRODUODENOSCOPY (EGD) WITH PROPOFOL N/A 04/13/2016   Procedure: ESOPHAGOGASTRODUODENOSCOPY (EGD) WITH PROPOFOL;  Surgeon: Gatha Mayer, MD;  Location: [facility];  Service: Endoscopy;  Laterality: N/A;  . ESOPHAGOGASTRODUODENOSCOPY (EGD) WITH PROPOFOL N/A 01/02/2017   Procedure: ESOPHAGOGASTRODUODENOSCOPY (EGD) WITH PROPOFOL;  Surgeon: Yetta Flock, MD;  Location: [facility] ENDOSCOPY;  Service: Gastroenterology;  Laterality: N/A;  . LEFT ARM IMPLANT X2     RADIATION TX ALSO    Family History  Problem Relation Age of Onset  . Diabetes Father   . Diabetes Sister   . Hypertension Sister   . Hypertension Brother   . Heart disease Neg Hx   . Colon cancer Neg Hx   . Stomach cancer Neg Hx     Social History:  reports that he quit smoking about 18 years ago. He quit after 10.00 years of use. he has never used smokeless tobacco. He reports that he does not drink alcohol or use drugs.    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 He has been told that he has a cataract since he was young                The blood pressure has been treated only with metoprolol low doses now, this needs to be monitored by his PCP or nephrologist    Followed by nephrologist Dr. Florene Glen for his chronic kidney disease and edema    Lab Results  Component Value Date   CREATININE 1.84 (H) 03/20/2017   CREATININE 2.34 (H) 02/07/2017   CREATININE 2.20 (H) 01/29/2017      LABS:  Appointment on 03/20/2017  Component Date Value Ref Range Status  . Sodium 03/20/2017 141  135 - 145 mEq/L Final  . Potassium 03/20/2017 4.3  3.5 - 5.1 mEq/L Final  . Chloride 03/20/2017 106  96 - 112 mEq/L Final  . CO2 03/20/2017 24  19 - 32 mEq/L Final  . Glucose, Bld 03/20/2017 163* 70 - 99 mg/dL Final  . BUN 03/20/2017 36* 6 - 23 mg/dL Final  . Creatinine, Ser 03/20/2017 1.84* 0.40 - 1.50 mg/dL Final  . Total Bilirubin 03/20/2017 0.4  0.2 - 1.2 mg/dL Final  . Alkaline Phosphatase 03/20/2017 65  39 - 117 U/L Final  . AST 03/20/2017 18  0 - 37 U/L Final  . ALT 03/20/2017 7  0 - 53 U/L Final  . Total Protein 03/20/2017 5.7* 6.0 - 8.3 g/dL Final  . Albumin 03/20/2017 2.9* 3.5 - 5.2 g/dL Final  . Calcium 03/20/2017 8.6  8.4 - 10.5 mg/dL Final  . GFR 03/20/2017 46.22* >60.00 mL/min Final    Physical Examination:  BP 134/82   Pulse 78   Ht 5\' 8"  (1.727 m)   Wt 128 lb 6.4 oz (58.2 kg)   SpO2  98%   BMI 19.52 kg/m    Diabetic Foot Exam - Simple   Simple Foot Form Diabetic Foot exam was performed with the following findings:  Yes 03/21/2017  9:02 AM  Visual Inspection No deformities, no ulcerations, no other skin breakdown bilaterally:  Yes Sensation Testing Intact to touch and monofilament testing bilaterally:  Yes Pulse Check See comments:   Yes Comments Pedal pulses not palpable 1+ pedal edema      ASSESSMENT/PLAN:  1. Diabetes type 2, nonobese See history of present illness for discussion of current diabetes management, blood sugar patterns and problems identified  His blood sugars now overall relatively better and not fluctuating but still not adequately controlled Most of his high blood sugars are related to his drinking a lot of juices especially in the evenings His weight has improved and he has no significant edema with starting Actos which has worked safely for him before  Recommendations:  Stop drinking all juices and given him options of other on sweetened beverages  He will try to check his blood sugars more after meals about 2 hours later and discussed blood sugar targets  Continue to walk as much as tolerated  No change in medications at this time but consider adding 0.5 mg Amaryl if he has persistently high postprandial readings or high fructosamine on the next visit   2.  WEIGHT loss, resolved  3.  Renal insufficiency with recently improved renal function     Patient Instructions  STOP  juices such as grape or apple juice, MORE FLAVORED WATER sugar free only  Check blood sugars on waking up  3/7 days  Also check blood sugars about 2 hours after a meal and do this after different meals by rotation  Recommended blood sugar levels on waking up is 90-130 and about 2 hours after meal is 130-160  Please bring your blood sugar monitor to each visit, thank you       Elayne Snare 03/21/2017, 9:06 AM   Note: This office note was prepared with Dragon voice recognition system technology. Any transcriptional errors that result from this process are unintentional.

## 2017-03-21 NOTE — Assessment & Plan Note (Signed)
Encouraged annual eye exam, his last eye exam was last year. He says he will call his ophthalmologist to schedule. - HM Diabetes Foot Exam

## 2017-03-21 NOTE — Assessment & Plan Note (Signed)
Recently with labile readings We discussed his BP medication regimen today, which was changed on his hospitalization in October and hes unsure if hes been taking the correct medication since. He was sent home with correct medication highlighted on his AVS. He will make sure he is taking the correct medication and notify me for readings >140/90 or <110/70. - Comprehensive metabolic panel; Future

## 2017-03-21 NOTE — Patient Instructions (Addendum)
STOP  juices such as grape or apple juice, MORE FLAVORED WATER sugar free only  Check blood sugars on waking up  3/7 days  Also check blood sugars about 2 hours after a meal and do this after different meals by rotation  Recommended blood sugar levels on waking up is 90-130 and about 2 hours after meal is 130-160  Please bring your blood sugar monitor to each visit, thank you

## 2017-04-11 ENCOUNTER — Telehealth: Payer: Self-pay | Admitting: Nurse Practitioner

## 2017-04-11 ENCOUNTER — Other Ambulatory Visit: Payer: Self-pay

## 2017-04-11 ENCOUNTER — Encounter (HOSPITAL_COMMUNITY): Payer: Self-pay | Admitting: Emergency Medicine

## 2017-04-11 MED ORDER — METOPROLOL TARTRATE 25 MG PO TABS: 12.5000 mg | ORAL_TABLET | Freq: Two times a day (BID) | ORAL | 5 refills | Status: AC

## 2017-04-11 NOTE — Telephone Encounter (Signed)
Reviewed chart pt is up-to-date sent refills to [facility].../lmb  

## 2017-04-11 NOTE — Telephone Encounter (Signed)
Copied from [place] [id number]. Topic: Quick Communication - See Telephone Encounter >> Apr 11, 2017 11:12 AM Bea Graff, NT wrote: CRM for notification. See Telephone encounter for: Pt needing a refill of metoprolol tartrate. States he has been out of this medication for 2 weeks. [facility] [address].   04/11/17.

## 2017-04-11 NOTE — Telephone Encounter (Signed)
Routing back LR: 01/26/17 60# no refills Unknown provider

## 2017-04-15 ENCOUNTER — Other Ambulatory Visit: Payer: Self-pay | Admitting: Endocrinology

## 2017-04-18 ENCOUNTER — Other Ambulatory Visit: Payer: Self-pay

## 2017-04-18 ENCOUNTER — Inpatient Hospital Stay (HOSPITAL_COMMUNITY)
Admission: EM | Admit: 2017-04-18 | Discharge: 2017-04-23 | DRG: 391 | Disposition: A | Discharge status: Home Health | Payer: Medicare Other | Attending: Internal Medicine | Admitting: Internal Medicine

## 2017-04-18 ENCOUNTER — Telehealth: Payer: Self-pay | Admitting: Gastroenterology

## 2017-04-18 ENCOUNTER — Emergency Department (HOSPITAL_COMMUNITY): Payer: Medicare Other

## 2017-04-18 DIAGNOSIS — E1310 Other specified diabetes mellitus with ketoacidosis without coma: Secondary | ICD-10-CM | POA: Diagnosis not present

## 2017-04-18 DIAGNOSIS — R5383 Other fatigue: Secondary | ICD-10-CM | POA: Diagnosis not present

## 2017-04-18 DIAGNOSIS — E43 Unspecified severe protein-calorie malnutrition: Secondary | ICD-10-CM | POA: Diagnosis present

## 2017-04-18 DIAGNOSIS — R1314 Dysphagia, pharyngoesophageal phase: Secondary | ICD-10-CM | POA: Diagnosis present

## 2017-04-18 DIAGNOSIS — C61 Malignant neoplasm of prostate: Secondary | ICD-10-CM | POA: Diagnosis present

## 2017-04-18 DIAGNOSIS — E785 Hyperlipidemia, unspecified: Secondary | ICD-10-CM | POA: Diagnosis present

## 2017-04-18 DIAGNOSIS — T18198A Other foreign object in esophagus causing other injury, initial encounter: Secondary | ICD-10-CM | POA: Diagnosis present

## 2017-04-18 DIAGNOSIS — Z833 Family history of diabetes mellitus: Secondary | ICD-10-CM

## 2017-04-18 DIAGNOSIS — R1110 Vomiting, unspecified: Secondary | ICD-10-CM | POA: Diagnosis not present

## 2017-04-18 DIAGNOSIS — R404 Transient alteration of awareness: Secondary | ICD-10-CM | POA: Diagnosis not present

## 2017-04-18 DIAGNOSIS — I130 Hypertensive heart and chronic kidney disease with heart failure and stage 1 through stage 4 chronic kidney disease, or unspecified chronic kidney disease: Secondary | ICD-10-CM | POA: Diagnosis not present

## 2017-04-18 DIAGNOSIS — E870 Hyperosmolality and hypernatremia: Secondary | ICD-10-CM | POA: Diagnosis present

## 2017-04-18 DIAGNOSIS — T18128A Food in esophagus causing other injury, initial encounter: Secondary | ICD-10-CM | POA: Diagnosis not present

## 2017-04-18 DIAGNOSIS — I5032 Chronic diastolic (congestive) heart failure: Secondary | ICD-10-CM | POA: Diagnosis not present

## 2017-04-18 DIAGNOSIS — D630 Anemia in neoplastic disease: Secondary | ICD-10-CM | POA: Diagnosis present

## 2017-04-18 DIAGNOSIS — N179 Acute kidney failure, unspecified: Secondary | ICD-10-CM | POA: Diagnosis present

## 2017-04-18 DIAGNOSIS — K219 Gastro-esophageal reflux disease without esophagitis: Secondary | ICD-10-CM | POA: Diagnosis present

## 2017-04-18 DIAGNOSIS — Z7984 Long term (current) use of oral hypoglycemic drugs: Secondary | ICD-10-CM

## 2017-04-18 DIAGNOSIS — E11649 Type 2 diabetes mellitus with hypoglycemia without coma: Secondary | ICD-10-CM | POA: Diagnosis not present

## 2017-04-18 DIAGNOSIS — D631 Anemia in chronic kidney disease: Secondary | ICD-10-CM | POA: Diagnosis present

## 2017-04-18 DIAGNOSIS — Z87891 Personal history of nicotine dependence: Secondary | ICD-10-CM | POA: Diagnosis not present

## 2017-04-18 DIAGNOSIS — K222 Esophageal obstruction: Principal | ICD-10-CM | POA: Diagnosis present

## 2017-04-18 DIAGNOSIS — R1310 Dysphagia, unspecified: Secondary | ICD-10-CM

## 2017-04-18 DIAGNOSIS — I509 Heart failure, unspecified: Secondary | ICD-10-CM | POA: Diagnosis not present

## 2017-04-18 DIAGNOSIS — Z8249 Family history of ischemic heart disease and other diseases of the circulatory system: Secondary | ICD-10-CM | POA: Diagnosis not present

## 2017-04-18 DIAGNOSIS — N183 Chronic kidney disease, stage 3 (moderate): Secondary | ICD-10-CM | POA: Diagnosis present

## 2017-04-18 DIAGNOSIS — E860 Dehydration: Secondary | ICD-10-CM | POA: Diagnosis not present

## 2017-04-18 DIAGNOSIS — E1122 Type 2 diabetes mellitus with diabetic chronic kidney disease: Secondary | ICD-10-CM | POA: Diagnosis not present

## 2017-04-18 DIAGNOSIS — R627 Adult failure to thrive: Secondary | ICD-10-CM | POA: Diagnosis not present

## 2017-04-18 DIAGNOSIS — R531 Weakness: Secondary | ICD-10-CM | POA: Diagnosis not present

## 2017-04-18 DIAGNOSIS — K228 Other specified diseases of esophagus: Secondary | ICD-10-CM | POA: Diagnosis not present

## 2017-04-18 DIAGNOSIS — I10 Essential (primary) hypertension: Secondary | ICD-10-CM | POA: Diagnosis present

## 2017-04-18 DIAGNOSIS — R1319 Other dysphagia: Secondary | ICD-10-CM

## 2017-04-18 DIAGNOSIS — K220 Achalasia of cardia: Secondary | ICD-10-CM | POA: Diagnosis not present

## 2017-04-18 DIAGNOSIS — Z681 Body mass index (BMI) 19 or less, adult: Secondary | ICD-10-CM

## 2017-04-18 DIAGNOSIS — E1129 Type 2 diabetes mellitus with other diabetic kidney complication: Secondary | ICD-10-CM | POA: Diagnosis present

## 2017-04-18 LAB — URINALYSIS, ROUTINE W REFLEX MICROSCOPIC
Bilirubin Urine: NEGATIVE
GLUCOSE, UA: NEGATIVE mg/dL
HGB URINE DIPSTICK: NEGATIVE
Ketones, ur: 20 mg/dL — AB
LEUKOCYTES UA: NEGATIVE
Nitrite: NEGATIVE
PROTEIN: NEGATIVE mg/dL
Specific Gravity, Urine: 1.014 (ref 1.005–1.030)
pH: 5 (ref 5.0–8.0)

## 2017-04-18 LAB — HEPATIC FUNCTION PANEL
ALT: 8 U/L — AB (ref 17–63)
AST: 38 U/L (ref 15–41)
Albumin: 2 g/dL — ABNORMAL LOW (ref 3.5–5.0)
Alkaline Phosphatase: 77 U/L (ref 38–126)
BILIRUBIN DIRECT: 0.5 mg/dL (ref 0.1–0.5)
BILIRUBIN INDIRECT: 1 mg/dL — AB (ref 0.3–0.9)
TOTAL PROTEIN: 4.6 g/dL — AB (ref 6.5–8.1)
Total Bilirubin: 1.5 mg/dL — ABNORMAL HIGH (ref 0.3–1.2)

## 2017-04-18 LAB — BASIC METABOLIC PANEL
ANION GAP: 19 — AB (ref 5–15)
BUN: 67 mg/dL — AB (ref 6–20)
CALCIUM: 8.7 mg/dL — AB (ref 8.9–10.3)
CO2: 21 mmol/L — ABNORMAL LOW (ref 22–32)
Chloride: 112 mmol/L — ABNORMAL HIGH (ref 101–111)
Creatinine, Ser: 2.58 mg/dL — ABNORMAL HIGH (ref 0.61–1.24)
GFR calc Af Amer: 26 mL/min — ABNORMAL LOW (ref >60)
GFR, EST NON AFRICAN AMERICAN: 23 mL/min — AB (ref >60)
Glucose, Bld: 238 mg/dL — ABNORMAL HIGH (ref 65–99)
POTASSIUM: 3.6 mmol/L (ref 3.5–5.1)
SODIUM: 152 mmol/L — AB (ref 135–145)

## 2017-04-18 LAB — CBC
HCT: 28.2 % — ABNORMAL LOW (ref 39.0–52.0)
Hemoglobin: 9.3 g/dL — ABNORMAL LOW (ref 13.0–17.0)
MCH: 32.6 pg (ref 26.0–34.0)
MCHC: 33 g/dL (ref 30.0–36.0)
MCV: 98.9 fL (ref 78.0–100.0)
PLATELETS: 265 10*3/uL (ref 150–400)
RBC: 2.85 MIL/uL — AB (ref 4.22–5.81)
RDW: 14.6 % (ref 11.5–15.5)
WBC: 7.9 10*3/uL (ref 4.0–10.5)

## 2017-04-18 LAB — PROCALCITONIN: PROCALCITONIN: 0.13 ng/mL

## 2017-04-18 LAB — I-STAT CG4 LACTIC ACID, ED: LACTIC ACID, VENOUS: 3.14 mmol/L — AB (ref 0.5–1.9)

## 2017-04-18 MED ORDER — SODIUM CHLORIDE 0.9 % IV SOLN: INTRAVENOUS | Status: DC

## 2017-04-18 MED ORDER — ACETAMINOPHEN 325 MG PO TABS: 650.0000 mg | ORAL_TABLET | Freq: Four times a day (QID) | ORAL | Status: DC | PRN

## 2017-04-18 MED ORDER — LACTATED RINGERS IV SOLN
INTRAVENOUS | Status: DC
  Administered 2017-04-18 – 2017-04-21 (×5): via INTRAVENOUS

## 2017-04-18 MED ORDER — ENOXAPARIN SODIUM 40 MG/0.4ML ~~LOC~~ SOLN: 40.0000 mg | SUBCUTANEOUS | Status: DC

## 2017-04-18 MED ORDER — SODIUM CHLORIDE 0.9 % IV BOLUS (SEPSIS)
1000.0000 mL | Freq: Once | INTRAVENOUS | Status: AC
  Administered 2017-04-18: 1000 mL via INTRAVENOUS

## 2017-04-18 MED ORDER — HYDRALAZINE HCL 20 MG/ML IJ SOLN: 10.0000 mg | INTRAMUSCULAR | Status: DC | PRN

## 2017-04-18 MED ORDER — PRO-STAT SUGAR FREE PO LIQD: 30.0000 mL | Freq: Every day | ORAL | Status: DC

## 2017-04-18 MED ORDER — INSULIN ASPART 100 UNIT/ML ~~LOC~~ SOLN
0.0000 [IU] | SUBCUTANEOUS | Status: DC
  Administered 2017-04-19: 3 [IU] via SUBCUTANEOUS
  Administered 2017-04-19: 1 [IU] via SUBCUTANEOUS
  Administered 2017-04-19 – 2017-04-20 (×3): 2 [IU] via SUBCUTANEOUS
  Administered 2017-04-21 (×3): 1 [IU] via SUBCUTANEOUS
  Administered 2017-04-22 (×2): 2 [IU] via SUBCUTANEOUS

## 2017-04-18 MED ORDER — ACETAMINOPHEN 650 MG RE SUPP: 650.0000 mg | Freq: Four times a day (QID) | RECTAL | Status: DC | PRN

## 2017-04-18 MED ORDER — ATORVASTATIN CALCIUM 10 MG PO TABS: 10.0000 mg | ORAL_TABLET | Freq: Every day | ORAL | Status: DC

## 2017-04-18 NOTE — ED Provider Notes (Signed)
[facility] DEPT Provider Note   CSN: [id number] Arrival date & time: 04/18/17  1653     History   Chief Complaint Chief Complaint  Patient presents with  . Failure To Thrive    HPI SUFIAN RAVI is a 77 y.o. male.  HPI   77 year old male with past medical history of chronic kidney disease, diabetes, achalasia, history of recurrent admissions for dehydration here with generalized weakness.  History is somewhat limited as patient minimizes his symptoms.  According the family, the patient has been eating more solids food than he normally should due to the recent holidays.  Over the last several days, he is been unable to eat or drink anything.  He immediately vomits everything he tries to eat and vomits undigested food.  He endorses generalized weakness as well as lightheadedness upon standing.  Denies any fevers.  Denies any pain.  He has not had blood in his emesis.  He has been moving his bowels normally.  Denies any other complaints.  He is able to swallow his own secretions.  He states the symptoms only occur when he tries to eat or drink.  Past Medical History:  Diagnosis Date  . Anemia    IRON TRANSFUSION 1 MONTH AGO  . Blood clot in vein 2008   LEFT LEG  . Cataract    LEFT  . CKD (chronic kidney disease) 02/2016  . Diabetes mellitus, type 2 ([place]) 2008  . GERD (gastroesophageal reflux disease)   . Hyperlipidemia 2008  . Hypertension 2008  . Pneumonia 01/18/2016  . Prostate cancer ([place]) 2006   IMPLANT IN LEFT ARM FOR TX    Patient Active Problem List   Diagnosis Date Noted  . Cavitary lesion of lung 01/29/2017  . Hypotension 01/21/2017  . Chronic diastolic CHF (congestive heart failure) ([place]) 01/21/2017  . Chronic kidney disease, stage 3 ([place]) 07/24/2016  . Hypoglycemia 04/11/2016  . Orthostatic hypotension 04/11/2016  . Sepsis ([place]) 02/21/2016  . Pressure injury of skin 02/19/2016  . Hypernatremia 02/19/2016  . Acute  esophagitis   . Food impaction of esophagus   . Achalasia   . Acute GI bleeding 02/16/2016  . Acute blood loss anemia 02/16/2016  . Anemia 02/15/2016  . Right leg weakness 01/29/2016  . HCAP (healthcare-associated pneumonia) 01/28/2016  . Dehydration   . Hypomagnesemia   . Protein-calorie malnutrition, severe 01/09/2016  . Hyperglycemia 01/08/2016  . Hypokalemia 01/08/2016  . Acute renal failure superimposed on stage 3 chronic kidney disease ([place]) 01/08/2016  . Prolonged Q-T interval on ECG 01/08/2016  . 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 Bilateral lower extremity edema 09/07/2013  . Pulmonary embolus ([place]) 09/01/2010  . Chronic deep vein thrombosis (DVT) ([place]) 08/25/2010  . Type II diabetes mellitus with renal manifestations ([place]) 09/15/2006  . Hyperlipidemia associated with type 2 diabetes mellitus ([place]) 09/13/2006  . Essential hypertension 09/13/2006  . Prostate cancer metastatic to multiple sites [facility]) 09/13/2006    Past Surgical History:  Procedure Laterality Date  . BALLOON DILATION N/A 04/13/2016   Procedure: BALLOON DILATION;  Surgeon: Gatha Mayer, MD;  Location: [facility] ENDOSCOPY;  Service: Endoscopy;  Laterality: N/A;  . BOTOX INJECTION N/A 04/13/2016   Procedure: BOTOX INJECTION;  Surgeon: Gatha Mayer, MD;  Location: [facility];  Service: Endoscopy;  Laterality: N/A;  . BOTOX INJECTION N/A 01/02/2017   Procedure: BOTOX INJECTION;  Surgeon: Yetta Flock, MD;  Location: [facility] ENDOSCOPY;  Service: Gastroenterology;  Laterality: N/A;  . COLONOSCOPY  W/ POLYPECTOMY  03/2008   diminutive rectal polyp (path: prolapse type polyp, not adenoma).  moderate pan-diverticulitis.   [facility] ESOPHAGEAL MANOMETRY  1984   unable to pass manometry catheter beyond LES. Aperiystalsis of esophagus: sugg of achalasia.   [facility] ESOPHAGOGASTRODUODENOSCOPY N/A 02/17/2016   Procedure: ESOPHAGOGASTRODUODENOSCOPY (EGD);  Surgeon: [name], MD;  Location: [facility];  Service: Endoscopy;   Laterality: N/A;  . ESOPHAGOGASTRODUODENOSCOPY (EGD) WITH PROPOFOL N/A 02/21/2016   Procedure: ESOPHAGOGASTRODUODENOSCOPY (EGD) WITH PROPOFOL;  Surgeon: Jerene Bears, MD;  Location: [facility] ENDOSCOPY;  Service: Gastroenterology;  Laterality: N/A;  . ESOPHAGOGASTRODUODENOSCOPY (EGD) WITH PROPOFOL N/A 02/28/2016   Procedure: ESOPHAGOGASTRODUODENOSCOPY (EGD) WITH PROPOFOL withBOTOX;  Surgeon: Manus Gunning, MD;  Location: [facility] ENDOSCOPY;  Service: Gastroenterology;  Laterality: N/A;  . ESOPHAGOGASTRODUODENOSCOPY (EGD) WITH PROPOFOL N/A 04/13/2016   Procedure: ESOPHAGOGASTRODUODENOSCOPY (EGD) WITH PROPOFOL;  Surgeon: Gatha Mayer, MD;  Location: [facility];  Service: Endoscopy;  Laterality: N/A;  . ESOPHAGOGASTRODUODENOSCOPY (EGD) WITH PROPOFOL N/A 01/02/2017   Procedure: ESOPHAGOGASTRODUODENOSCOPY (EGD) WITH PROPOFOL;  Surgeon: Yetta Flock, MD;  Location: [facility] ENDOSCOPY;  Service: Gastroenterology;  Laterality: N/A;  . LEFT ARM IMPLANT X2     RADIATION TX ALSO       Home Medications    Prior to Admission medications   Medication Sig Start Date End Date Taking? Authorizing Provider  ACCU-CHEK FASTCLIX LANCETS MISC Use to check blood sugars twice daily E11.65 11/08/16  Yes Elayne Snare, MD  ACCU-CHEK FASTCLIX LANCETS MISC USE TO CHECK BLOOD SUGARS TWICE DAILY E11.65 02/11/17  Yes Elayne Snare, MD  Amino Acids-Protein Hydrolys (FEEDING SUPPLEMENT, PRO-STAT SUGAR FREE 64,) LIQD Take 30 mLs by mouth 2 (two) times daily. Patient taking differently: Take 30 mLs by mouth daily.  04/13/16  Yes Dessa Phi, DO  atorvastatin (LIPITOR) 10 MG tablet Take 1 tablet (10 mg total) by mouth at bedtime. 07/09/16  Yes Elayne Snare, MD  docusate sodium (COLACE) 100 MG capsule TAKE ONE CAPSULE BY MOUTH TWICE A DAY FOR CONSTIPATION 07/04/16  Yes Elayne Snare, MD  enzalutamide Gillermina Phy) 40 MG capsule Take 160 mg by mouth daily.   Yes [provider]  feeding supplement, ENSURE ENLIVE, (ENSURE ENLIVE) LIQD  Take 237 mLs by mouth 2 (two) times daily between meals. 01/26/17  Yes Florencia Reasons, MD  ferrous sulfate 325 (65 FE) MG tablet TAKE 1 TABLET BY MOUTH EVERY DAY FOR SUPPLEMENT 02/20/17  Yes Elayne Snare, MD  furosemide (LASIX) 80 MG tablet Take 80 mg by mouth daily. 03/26/17  Yes [provider]  glipiZIDE (GLUCOTROL XL) 5 MG 24 hr tablet Take 5 mg by mouth daily with breakfast.   Yes [provider]  glucose blood test strip Use as instructed 11/08/16  Yes Elayne Snare, MD  metoprolol tartrate (LOPRESSOR) 25 MG tablet Take 0.5 tablets (12.5 mg total) by mouth 2 (two) times daily. 04/11/17  Yes Lance Sell, NP  Multiple Vitamin (MULTIVITAMIN WITH MINERALS) TABS tablet Take 1 tablet by mouth daily.   Yes [provider]  ondansetron (ZOFRAN-ODT) 4 MG disintegrating tablet Take 1 tablet (4 mg total) by mouth every 8 (eight) hours as needed for nausea or vomiting. 01/18/17  Yes Golden Circle, FNP  pantoprazole (PROTONIX) 40 MG tablet TAKE 1 TABLET TWICE A DAY 02/14/17  Yes Elayne Snare, MD  pioglitazone (ACTOS) 15 MG tablet Take 1 tablet (15 mg total) daily by mouth. 02/12/17  Yes Elayne Snare, MD  TRADJENTA 5 MG TABS tablet TAKE 1 TABLET (5  MG TOTAL) BY MOUTH DAILY. 04/15/17  Yes Elayne Snare, MD    Family History Family History  Problem Relation Age of Onset  . Diabetes Father   . Diabetes Sister   . Hypertension Sister   . Hypertension Brother   . Heart disease Neg Hx   . Colon cancer Neg Hx   . Stomach cancer Neg Hx     Social History Social History   Tobacco Use  . Smoking status: Former Smoker    Years: 10.00    Last attempt to quit: 2000    Years since quitting: 19.0  . Smokeless tobacco: Never Used  Substance Use Topics  . Alcohol use: No  . Drug use: No     Allergies   Patient has no known allergies.   Review of Systems Review of Systems  Constitutional: Positive for fatigue.  HENT: Negative for trouble swallowing.   Gastrointestinal:  Positive for nausea and vomiting.  Neurological: Positive for weakness and light-headedness (upon standing).  All other systems reviewed and are negative.    Physical Exam Updated Vital Signs BP 135/88   Pulse (!) 106   Temp 97.9 F (36.6 C) (Rectal)   Resp 17   Ht 5\' 8"  (1.727 m)   Wt 54 kg (119 lb)   SpO2 100%   BMI 18.09 kg/m   Physical Exam  Constitutional: He is oriented to person, place, and time. He appears well-developed and well-nourished. No distress.  HENT:  Head: Normocephalic and atraumatic.  Mouth/Throat: Oropharynx is clear and moist.  Markedly dry mucous membranes  Eyes: Conjunctivae are normal.  Neck: Neck supple.  Cardiovascular: Normal rate, regular rhythm and normal heart sounds. Exam reveals no friction rub.  No murmur heard. Pulmonary/Chest: Effort normal and breath sounds normal. No respiratory distress. He has no wheezes. He has no rales.  Abdominal: He exhibits no distension.  Musculoskeletal: He exhibits no edema.  Neurological: He is alert and oriented to person, place, and time. He exhibits normal muscle tone.  Skin: Skin is warm. Capillary refill takes less than 2 seconds.  Poor skin turgor with tenting  Psychiatric: He has a normal mood and affect.  Nursing note and vitals reviewed.    ED Treatments / Results  Labs (all labs ordered are listed, but only abnormal results are displayed) Labs Reviewed  BASIC METABOLIC PANEL - Abnormal; Notable for the following components:      Result Value   Sodium 152 (*)    Chloride 112 (*)    CO2 21 (*)    Glucose, Bld 238 (*)    BUN 67 (*)    Creatinine, Ser 2.58 (*)    Calcium 8.7 (*)    GFR calc non Af Amer 23 (*)    GFR calc Af Amer 26 (*)    Anion gap 19 (*)    All other components within normal limits  URINALYSIS, ROUTINE W REFLEX MICROSCOPIC - Abnormal; Notable for the following components:   Ketones, ur 20 (*)    All other components within normal limits  CBC - Abnormal; Notable for  the following components:   RBC 2.85 (*)    Hemoglobin 9.3 (*)    HCT 28.2 (*)    All other components within normal limits  HEPATIC FUNCTION PANEL - Abnormal; Notable for the following components:   Total Protein 4.6 (*)    Albumin 2.0 (*)    ALT 8 (*)    Total Bilirubin 1.5 (*)    Indirect Bilirubin  1.0 (*)    All other components within normal limits  I-STAT CG4 LACTIC ACID, ED - Abnormal; Notable for the following components:   Lactic Acid, Venous 3.14 (*)    All other components within normal limits  PROCALCITONIN  PROCALCITONIN  LACTIC ACID, PLASMA  CBC  BASIC METABOLIC PANEL  I-STAT CG4 LACTIC ACID, ED    EKG  EKG Interpretation None       Radiology Dg Chest 2 View  Result Date: 04/18/2017 CLINICAL DATA:  Tachycardic and failure to thrive.  Achalasia. EXAM: CHEST  2 VIEW COMPARISON:  01/18/2017 and CT 01/23/2017 FINDINGS: Lungs are adequately inflated without focal consolidation or effusion. Possible nodule opacity in the retrosternal area. Cardiomediastinal silhouette and remainder of the exam is unchanged. IMPRESSION: No acute cardiopulmonary disease. Possible nodular density in the retrosternal region. Recommend follow-up PA and lateral chest radiograph versus noncontrast chest CT on elective basis for further evaluation. Electronically Signed   By: Marin Olp M.D.   On: 04/18/2017 19:40    Procedures Procedures (including critical care time)  Medications Ordered in ED Medications  lactated ringers infusion ( Intravenous New Bag/Given 04/18/17 2009)  insulin aspart (novoLOG) injection 0-9 Units (not administered)  acetaminophen (TYLENOL) tablet 650 mg (not administered)    Or  acetaminophen (TYLENOL) suppository 650 mg (not administered)  hydrALAZINE (APRESOLINE) injection 10 mg (not administered)  sodium chloride 0.9 % bolus 1,000 mL (0 mLs Intravenous Stopped 04/18/17 1844)  sodium chloride 0.9 % bolus 1,000 mL (0 mLs Intravenous Stopped 04/18/17 1945)      Initial Impression / Assessment and Plan / ED Course  I have reviewed the triage vital signs and the nursing notes.  Pertinent labs & imaging results that were available during my care of the patient were reviewed by me and considered in my medical decision making (see chart for details).    77 year old male with history of recurrent failure to thrive and known achalasia here with poor p.o. intake and generalized weakness.  Exam and lab work is consistent with severe volume depletion and acute on chronic kidney injury.  Patient given 2 L boluses and will place on maintenance fluids.  I will also discussed with [facility] as he is scheduled for an EGD on Monday.  Of note, lactic acid elevated.  I suspect this is due to his dehydration.  Pro-calcitonin negative.  Doubt sepsis.  Continue fluids.  Final Clinical Impressions(s) / ED Diagnoses   Final diagnoses:  AKI (acute kidney injury) ([place])  Failure to thrive in adult    ED Discharge Orders    None       Duffy Bruce, MD 04/18/17 2345

## 2017-04-18 NOTE — ED Notes (Signed)
Bed: WA23 Expected date:  Expected time:  Means of arrival:  Comments: 

## 2017-04-18 NOTE — Telephone Encounter (Signed)
Ms. Kirtz calls today because the patient is unable to get any PO down his esophagus. She is aware he has been eating solids he is not supposed to eat including a "trip to [place]." He has been "spitting and vomiting up everything including liquids." He requested she bring him some broth today but has not taken any of this. He is rather staying in the bed too weak now to do anything. She reports the weakness has drastically worsened.  Spoke with Dr Silverio Decamp about this patient.  Ms. Cumbie is instructed to take the patient to the ER. She reports he is "so weak, I will need to call EMS. " She will do this now. Patient is scheduled for a DG esophagus on 04/23/17.  He is scheduled for EGD on 04/22/17. These had been scheduled at his last office visit.

## 2017-04-18 NOTE — H&P (Signed)
History and Physical    [name] OVF:[id number] DOB: [birth year] DOA: 04/18/2017  PCP: Lance Sell, NP  Patient coming from: Home  I have personally briefly reviewed patient's old medical records in [place]  Chief Complaint: Unable to tolerate POs  HPI: [name] is a 77 y.o. 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  Recurrent admits for dehydration with generalized weakness.  Patient apparently has been eating more solid foods than he normally should due to the recent holidays.  Over the last couple of days however he has been unable to eat or drink anything though.  Is handling his own secretions but immediately vomits anything else he tries to take PO.  Vomit is NBNB (undigested food).  He endorses generalized weakness, feeling of severe dehydration.  Lightheaded on standing.  Denies pain "I never had pain with any of this" he states.   ED Course: lab work c/w severe dehydration: sodium 152, cl 112, creat 2.5 up from ~1.8 baseline.  BUN 67 up from 36 in Dec.  Given 2L NS bolus.  GI called.   Review of Systems: As per HPI otherwise 10 point review of systems negative.   Past Medical History:  Diagnosis Date  . Anemia    IRON TRANSFUSION 1 MONTH AGO  . Blood clot in vein 2008   LEFT LEG  . Cataract    LEFT  . CKD (chronic kidney disease) 02/2016  . Diabetes mellitus, type 2 ([place]) 2008  . GERD (gastroesophageal reflux disease)   . Hyperlipidemia 2008  . Hypertension 2008  . Pneumonia 01/18/2016  . Prostate cancer ([place]) 2006   IMPLANT IN LEFT ARM FOR [place]    Past Surgical History:  Procedure Laterality Date  . BALLOON DILATION N/A 04/13/2016   Procedure: BALLOON DILATION;  Surgeon: Gatha Mayer, MD;  Location: [facility] ENDOSCOPY;  Service: Endoscopy;  Laterality: N/A;  . BOTOX INJECTION N/A 04/13/2016   Procedure: BOTOX INJECTION;  Surgeon: Gatha Mayer, MD;  Location: [facility];  Service: Endoscopy;   Laterality: N/A;  . BOTOX INJECTION N/A 01/02/2017   Procedure: BOTOX INJECTION;  Surgeon: Yetta Flock, MD;  Location: [facility] ENDOSCOPY;  Service: Gastroenterology;  Laterality: N/A;  . COLONOSCOPY W/ POLYPECTOMY  03/2008   diminutive rectal polyp (path: prolapse type polyp, not adenoma).  moderate pan-diverticulitis.   [facility] Kitchen ESOPHAGEAL MANOMETRY  1984   unable to pass manometry catheter beyond LES. Aperiystalsis of esophagus: sugg of achalasia.   Marland Kitchen ESOPHAGOGASTRODUODENOSCOPY N/A 02/17/2016   Procedure: ESOPHAGOGASTRODUODENOSCOPY (EGD);  Surgeon: [name], MD;  Location: [facility] ENDOSCOPY;  Service: Endoscopy;  Laterality: N/A;  . ESOPHAGOGASTRODUODENOSCOPY (EGD) WITH PROPOFOL N/A 02/21/2016   Procedure: ESOPHAGOGASTRODUODENOSCOPY (EGD) WITH PROPOFOL;  Surgeon: Jerene Bears, MD;  Location: [facility] ENDOSCOPY;  Service: Gastroenterology;  Laterality: N/A;  . ESOPHAGOGASTRODUODENOSCOPY (EGD) WITH PROPOFOL N/A 02/28/2016   Procedure: ESOPHAGOGASTRODUODENOSCOPY (EGD) WITH PROPOFOL withBOTOX;  Surgeon: Manus Gunning, MD;  Location: [facility] ENDOSCOPY;  Service: Gastroenterology;  Laterality: N/A;  . ESOPHAGOGASTRODUODENOSCOPY (EGD) WITH PROPOFOL N/A 04/13/2016   Procedure: ESOPHAGOGASTRODUODENOSCOPY (EGD) WITH PROPOFOL;  Surgeon: Gatha Mayer, MD;  Location: [facility];  Service: Endoscopy;  Laterality: N/A;  . ESOPHAGOGASTRODUODENOSCOPY (EGD) WITH PROPOFOL N/A 01/02/2017   Procedure: ESOPHAGOGASTRODUODENOSCOPY (EGD) WITH PROPOFOL;  Surgeon: Yetta Flock, MD;  Location: [facility] ENDOSCOPY;  Service: Gastroenterology;  Laterality: N/A;  . LEFT ARM IMPLANT X2     RADIATION TX ALSO     reports  that he quit smoking about 19 years ago. He quit after 10.00 years of use. he has never used smokeless tobacco. He reports that he does not drink alcohol or use drugs.  No Known Allergies  Family History  Problem Relation Age of Onset  . Diabetes Father   . Diabetes Sister   . Hypertension Sister   .  Hypertension Brother   . Heart disease Neg Hx   . Colon cancer Neg Hx   . Stomach cancer Neg Hx      Prior to Admission medications   Medication Sig Start Date End Date Taking? Authorizing Provider  ACCU-CHEK FASTCLIX LANCETS MISC Use to check blood sugars twice daily E11.65 11/08/16  Yes Elayne Snare, MD  ACCU-CHEK FASTCLIX LANCETS MISC USE TO CHECK BLOOD SUGARS TWICE DAILY E11.65 02/11/17  Yes Elayne Snare, MD  Amino Acids-Protein Hydrolys (FEEDING SUPPLEMENT, PRO-STAT SUGAR FREE 64,) LIQD Take 30 mLs by mouth 2 (two) times daily. Patient taking differently: Take 30 mLs by mouth daily.  04/13/16  Yes Dessa Phi, DO  atorvastatin (LIPITOR) 10 MG tablet Take 1 tablet (10 mg total) by mouth at bedtime. 07/09/16  Yes Elayne Snare, MD  docusate sodium (COLACE) 100 MG capsule TAKE ONE CAPSULE BY MOUTH TWICE A DAY FOR CONSTIPATION 07/04/16  Yes Elayne Snare, MD  enzalutamide Gillermina Phy) 40 MG capsule Take 160 mg by mouth daily.   Yes [provider]  feeding supplement, ENSURE ENLIVE, (ENSURE ENLIVE) LIQD Take 237 mLs by mouth 2 (two) times daily between meals. 01/26/17  Yes Florencia Reasons, MD  ferrous sulfate 325 (65 FE) MG tablet TAKE 1 TABLET BY MOUTH EVERY DAY FOR SUPPLEMENT 02/20/17  Yes Elayne Snare, MD  furosemide (LASIX) 80 MG tablet Take 80 mg by mouth daily. 03/26/17  Yes [provider]  glipiZIDE (GLUCOTROL XL) 5 MG 24 hr tablet Take 5 mg by mouth daily with breakfast.   Yes [provider]  glucose blood test strip Use as instructed 11/08/16  Yes Elayne Snare, MD  metoprolol tartrate (LOPRESSOR) 25 MG tablet Take 0.5 tablets (12.5 mg total) by mouth 2 (two) times daily. 04/11/17  Yes Lance Sell, NP  Multiple Vitamin (MULTIVITAMIN WITH MINERALS) TABS tablet Take 1 tablet by mouth daily.   Yes [provider]  ondansetron (ZOFRAN-ODT) 4 MG disintegrating tablet Take 1 tablet (4 mg total) by mouth every 8 (eight) hours as needed for nausea or vomiting. 01/18/17  Yes  [place] Circle, FNP  pantoprazole (PROTONIX) 40 MG tablet TAKE 1 TABLET TWICE A DAY 02/14/17  Yes Elayne Snare, MD  pioglitazone (ACTOS) 15 MG tablet Take 1 tablet (15 mg total) daily by mouth. 02/12/17  Yes Elayne Snare, MD  TRADJENTA 5 MG TABS tablet TAKE 1 TABLET (5 MG TOTAL) BY MOUTH DAILY. 04/15/17  Yes Elayne Snare, MD    Physical Exam: Vitals:   04/18/17 1746 04/18/17 1945 04/18/17 2147 04/18/17 2217  BP:  133/75 113/79   Pulse:  90 (!) 107   Resp:  (!) 25 (!) 28   Temp:    97.9 F (36.6 C)  TempSrc:    Rectal  SpO2:  100% 100%   Weight: 54 kg (119 lb)     Height: 5\' 8"  (1.727 m)       Constitutional: NAD, calm, comfortable Eyes: PERRL, lids and conjunctivae normal ENMT: Mucous membranes are moist. Posterior pharynx clear of any exudate or lesions.Normal dentition.  Neck: normal, supple, no masses, no thyromegaly Respiratory: clear to auscultation  bilaterally, no wheezing, no crackles. Normal respiratory effort. No accessory muscle use.  Cardiovascular: Regular rate and rhythm, no murmurs / rubs / gallops. No extremity edema. 2+ pedal pulses. No carotid bruits.  Abdomen: no tenderness, no masses palpated. No hepatosplenomegaly. Bowel sounds positive.  Musculoskeletal: no clubbing / cyanosis. No joint deformity upper and lower extremities. Good ROM, no contractures. Normal muscle tone.  Skin: no rashes, lesions, ulcers. No induration Neurologic: CN 2-12 grossly intact. Sensation intact, DTR normal. Strength 5/5 in all 4.  Psychiatric: Normal judgment and insight. Alert and oriented x 3. Normal mood.    Labs on Admission: I have personally reviewed following labs and imaging studies  CBC: Recent Labs  Lab 04/18/17 2049  WBC 7.9  HGB 9.3*  HCT 28.2*  MCV 98.9  PLT 976   Basic Metabolic Panel: Recent Labs  Lab 04/18/17 1832  NA 152*  K 3.6  CL 112*  CO2 21*  GLUCOSE 238*  BUN 67*  CREATININE 2.58*  CALCIUM 8.7*   GFR: Estimated Creatinine Clearance: 18.6  mL/min (A) (by C-G formula based on SCr of 2.58 mg/dL (H)). Liver Function Tests: Recent Labs  Lab 04/18/17 2107  AST 38  ALT 8*  ALKPHOS 77  BILITOT 1.5*  PROT 4.6*  ALBUMIN 2.0*   No results for input(s): LIPASE, AMYLASE in the last 168 hours. No results for input(s): AMMONIA in the last 168 hours. Coagulation Profile: No results for input(s): INR, PROTIME in the last 168 hours. Cardiac Enzymes: No results for input(s): CKTOTAL, CKMB, CKMBINDEX, TROPONINI in the last 168 hours. BNP (last 3 results) No results for input(s): PROBNP in the last 8760 hours. HbA1C: No results for input(s): HGBA1C in the last 72 hours. CBG: No results for input(s): GLUCAP in the last 168 hours. Lipid Profile: No results for input(s): CHOL, HDL, LDLCALC, TRIG, CHOLHDL, LDLDIRECT in the last 72 hours. Thyroid Function Tests: No results for input(s): TSH, T4TOTAL, FREET4, T3FREE, THYROIDAB in the last 72 hours. Anemia Panel: No results for input(s): VITAMINB12, FOLATE, FERRITIN, TIBC, IRON, RETICCTPCT in the last 72 hours. Urine analysis:    Component Value Date/Time   COLORURINE YELLOW 04/18/2017 2049   APPEARANCEUR CLEAR 04/18/2017 2049   LABSPEC 1.014 04/18/2017 2049   PHURINE 5.0 04/18/2017 2049   GLUCOSEU NEGATIVE 04/18/2017 2049   GLUCOSEU NEGATIVE 09/22/2014 0757   HGBUR NEGATIVE 04/18/2017 2049   BILIRUBINUR NEGATIVE 04/18/2017 2049   BILIRUBINUR n 04/30/2011 1534   KETONESUR 20 (A) 04/18/2017 2049   PROTEINUR NEGATIVE 04/18/2017 2049   UROBILINOGEN 0.2 09/22/2014 0757   NITRITE NEGATIVE 04/18/2017 2049   LEUKOCYTESUR NEGATIVE 04/18/2017 2049    Radiological Exams on Admission: Dg Chest 2 View  Result Date: 04/18/2017 CLINICAL DATA:  Tachycardic and failure to thrive.  Achalasia. EXAM: CHEST  2 VIEW COMPARISON:  01/18/2017 and CT 01/23/2017 FINDINGS: Lungs are adequately inflated without focal consolidation or effusion. Possible nodule opacity in the retrosternal area.  Cardiomediastinal silhouette and remainder of the exam is unchanged. IMPRESSION: No acute cardiopulmonary disease. Possible nodular density in the retrosternal region. Recommend follow-up PA and lateral chest radiograph versus noncontrast chest CT on elective basis for further evaluation. Electronically Signed   By: Marin Olp M.D.   On: 04/18/2017 19:40    EKG: Independently reviewed.  Assessment/Plan Principal Problem:   Achalasia Active Problems:   Type II diabetes mellitus with renal manifestations (HCC)   Essential hypertension   Acute renal failure superimposed on stage 3 chronic kidney disease ([place])  Dehydration   Hypernatremia    1. Achalasia - also h/o strictures 1. NPO 2. [place] [facility] to eval in AM (was scheduled for EGD on Monday anyhow so may just end up doing this in AM). 2. Dehydration - with Hypernatremia and AKI on CKD stage 3 1. IVF: 2L NS bolus, 100cc/hr LR 2. Repeat BMP in AM 3. Holding diuretics (actually holding all meds since NPO due to achalasia). 3. HTN - 1. holding all meds due to NPO status 2. Use short acting IV if needed (hydralazine PRN ordered) 4. DM2 - 1. Sensitive scale SSI Q4H while NPO 5. Prostate cancer - holding PO Xtandi due to NPO status  DVT prophylaxis: SCDs - probable EGD in AM Code Status: Full Family Communication: No family in room Disposition Plan: Home after admit Consults called: EDP spoke with [facility] Admission status: Admit to inpatient - inpatient status due to inability to take POs, severe dehydration, need for EGD intervention on esophagus.   Etta Quill DO [facility] Pager [phone]  If 7AM-7PM, please contact day team taking care of patient [email or web address] Password [email or web address]  04/18/2017, 11:13 PM

## 2017-04-18 NOTE — ED Triage Notes (Signed)
Pt is from home with scheduled sx for Achalasia. Per pt and wife pt is unable to keep food down and is drinking ensures until sx. Pt presents tachycardic, hypotensive, weak, and failure to thrive.

## 2017-04-19 ENCOUNTER — Encounter (HOSPITAL_COMMUNITY): Payer: Self-pay

## 2017-04-19 DIAGNOSIS — E860 Dehydration: Secondary | ICD-10-CM

## 2017-04-19 DIAGNOSIS — R627 Adult failure to thrive: Secondary | ICD-10-CM

## 2017-04-19 DIAGNOSIS — K220 Achalasia of cardia: Secondary | ICD-10-CM

## 2017-04-19 DIAGNOSIS — R1310 Dysphagia, unspecified: Secondary | ICD-10-CM

## 2017-04-19 LAB — BASIC METABOLIC PANEL
Anion gap: 15 (ref 5–15)
BUN: 58 mg/dL — ABNORMAL HIGH (ref 6–20)
CHLORIDE: 114 mmol/L — AB (ref 101–111)
CO2: 23 mmol/L (ref 22–32)
Calcium: 8.5 mg/dL — ABNORMAL LOW (ref 8.9–10.3)
Creatinine, Ser: 2.09 mg/dL — ABNORMAL HIGH (ref 0.61–1.24)
GFR calc non Af Amer: 29 mL/min — ABNORMAL LOW (ref >60)
GFR, EST AFRICAN AMERICAN: 34 mL/min — AB (ref >60)
Glucose, Bld: 154 mg/dL — ABNORMAL HIGH (ref 65–99)
POTASSIUM: 3.4 mmol/L — AB (ref 3.5–5.1)
SODIUM: 152 mmol/L — AB (ref 135–145)

## 2017-04-19 LAB — GLUCOSE, CAPILLARY
GLUCOSE-CAPILLARY: 124 mg/dL — AB (ref 65–99)
GLUCOSE-CAPILLARY: 157 mg/dL — AB (ref 65–99)
GLUCOSE-CAPILLARY: 207 mg/dL — AB (ref 65–99)

## 2017-04-19 LAB — CBG MONITORING, ED
GLUCOSE-CAPILLARY: 134 mg/dL — AB (ref 65–99)
Glucose-Capillary: 168 mg/dL — ABNORMAL HIGH (ref 65–99)
Glucose-Capillary: 120 mg/dL — ABNORMAL HIGH (ref 65–99)

## 2017-04-19 LAB — CBC
HEMATOCRIT: 26.7 % — AB (ref 39.0–52.0)
HEMOGLOBIN: 8.6 g/dL — AB (ref 13.0–17.0)
MCH: 31.4 pg (ref 26.0–34.0)
MCHC: 32.2 g/dL (ref 30.0–36.0)
MCV: 97.4 fL (ref 78.0–100.0)
Platelets: 237 10*3/uL (ref 150–400)
RBC: 2.74 MIL/uL — AB (ref 4.22–5.81)
RDW: 14.8 % (ref 11.5–15.5)
WBC: 8.1 10*3/uL (ref 4.0–10.5)

## 2017-04-19 LAB — PROCALCITONIN: Procalcitonin: 0.14 ng/mL

## 2017-04-19 LAB — LACTIC ACID, PLASMA: Lactic Acid, Venous: 3.9 mmol/L (ref 0.5–1.9)

## 2017-04-19 MED ORDER — LACTATED RINGERS IV BOLUS (SEPSIS)
500.0000 mL | Freq: Once | INTRAVENOUS | Status: AC
Start: 2017-04-19 — End: 2017-04-19
  Administered 2017-04-19: 500 mL via INTRAVENOUS

## 2017-04-19 MED ORDER — BOOST / RESOURCE BREEZE PO LIQD CUSTOM
1.0000 | Freq: Three times a day (TID) | ORAL | Status: DC
  Administered 2017-04-19 – 2017-04-20 (×4): 1 via ORAL

## 2017-04-19 MED ORDER — LORAZEPAM 2 MG/ML IJ SOLN
0.5000 mg | Freq: Four times a day (QID) | INTRAMUSCULAR | Status: DC | PRN
  Administered 2017-04-19: 0.5 mg via INTRAVENOUS
  Filled 2017-04-19: qty 1

## 2017-04-19 MED ORDER — HEPARIN SODIUM (PORCINE) 5000 UNIT/ML IJ SOLN
5000.0000 [IU] | Freq: Three times a day (TID) | INTRAMUSCULAR | Status: DC
  Administered 2017-04-19 – 2017-04-21 (×6): 5000 [IU] via SUBCUTANEOUS
  Filled 2017-04-19 (×6): qty 1

## 2017-04-19 NOTE — Progress Notes (Signed)
PROGRESS NOTE  [name] HGD:[id number] DOB: 08/08/1940 DOA: 04/18/2017 PCP: Lance Sell, NP   LOS: 1 day   Brief Narrative / Interim history: 77 y.o. 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  Recurrent admits for dehydration with generalized weakness.  Patient apparently has been eating more solid foods than he normally should due to the recent holidays.  Over the last couple of days however he has been unable to eat or drink anything though.  Gastroenterology was consulted and patient was admitted to the hospital.  Assessment & Plan: Principal Problem:   Achalasia Active Problems:   Type II diabetes mellitus with renal manifestations ([place])   Essential hypertension   Acute renal failure superimposed on stage 3 chronic kidney disease (HCC)   Dehydration   Hypernatremia    Achalasia/history of strictures -Gastroenterology consulted, plan for EGD on 1/14 per GI -Continue IV fluids, allow clear liquid diet  Acute kidney injury on chronic kidney disease stage III -Due to dehydration, continue IV fluids, BMP this morning stable stop baseline creatinine around 2-2.5, he is at 2.0 this morning  Hypernatremia -Due to poor p.o. intake, monitor sodium, for now stable  Hypertension -Resume home metoprolol, likely needs to be crushed, hydralazine as needed for systolic greater than 962  Type 2 diabetes mellitus -Continue sliding scale as unclear p.o. intake, continue to monitor CBGs -CBGs in the 120s this morning  Prostate cancer -hold Xtandi   DVT prophylaxis: heparin (to be held on Sunday evening prior to EGD on Monday) Code Status: Full code Family Communication: No family at bedside Disposition Plan: Home when ready  Consultants:   Gastroenterology  Procedures:   None  Antimicrobials:  None    Subjective: - no chest pain, shortness of breath, no abdominal pain, nausea or vomiting.   Objective: Vitals:     01 /11/19 0200 04/19/17 0400 04/19/17 0530 04/19/17 0739  BP: 127/80 109/73 109/74 (!) 178/49  Pulse: 98 94 94 76  Resp: 13 (!) 22 14 (!) 21  Temp:      TempSrc:      SpO2: 99% 100% 100% 100%  Weight:      Height:        Intake/Output Summary (Last 24 hours) at 04/19/2017 1327 Last data filed at 04/19/2017 1300 Gross per 24 hour  Intake 2740 ml  Output -  Net 2740 ml   Filed Weights   04/18/17 1746  Weight: 54 kg (119 lb)    Examination:  Constitutional: NAD, cachectic male Eyes: lids and conjunctivae normal ENMT: Mucous membranes are moist. Neck: normal, supple Respiratory: clear to auscultation bilaterally, no wheezing, no crackles.  Cardiovascular: Regular rate and rhythm, no murmurs / rubs / gallops. No LE edema.  Abdomen: no tenderness. Bowel sounds positive.  Musculoskeletal: no clubbing / cyanosis.  Neurologic: CN 2-12 grossly intact. Strength 5/5 in all 4.  Psychiatric: Normal judgment and insight. Alert and oriented x 3. Normal mood.    Data Reviewed: I have independently reviewed following labs and imaging studies   CBC: Recent Labs  Lab 04/18/17 2049 04/19/17 0540  WBC 7.9 8.1  HGB 9.3* 8.6*  HCT 28.2* 26.7*  MCV 98.9 97.4  PLT 265 229   Basic Metabolic Panel: Recent Labs  Lab 04/18/17 1832 04/19/17 0540  NA 152* 152*  K 3.6 3.4*  CL 112* 114*  CO2 21* 23  GLUCOSE 238* 154*  BUN 67* 58*  CREATININE 2.58* 2.09*  CALCIUM 8.7* 8.5*  GFR: Estimated Creatinine Clearance: 23 mL/min (A) (by C-G formula based on SCr of 2.09 mg/dL (H)). Liver Function Tests: Recent Labs  Lab 04/18/17 2107  AST 38  ALT 8*  ALKPHOS 77  BILITOT 1.5*  PROT 4.6*  ALBUMIN 2.0*   No results for input(s): LIPASE, AMYLASE in the last 168 hours. No results for input(s): AMMONIA in the last 168 hours. Coagulation Profile: No results for input(s): INR, PROTIME in the last 168 hours. Cardiac Enzymes: No results for input(s): CKTOTAL, CKMB, CKMBINDEX, TROPONINI  in the last 168 hours. BNP (last 3 results) No results for input(s): PROBNP in the last 8760 hours. HbA1C: No results for input(s): HGBA1C in the last 72 hours. CBG: Recent Labs  Lab 04/19/17 0108 04/19/17 0531 04/19/17 0748 04/19/17 1134  GLUCAP 168* 134* 120* 124*   Lipid Profile: No results for input(s): CHOL, HDL, LDLCALC, TRIG, CHOLHDL, LDLDIRECT in the last 72 hours. Thyroid Function Tests: No results for input(s): TSH, T4TOTAL, FREET4, T3FREE, THYROIDAB in the last 72 hours. Anemia Panel: No results for input(s): VITAMINB12, FOLATE, FERRITIN, TIBC, IRON, RETICCTPCT in the last 72 hours. Urine analysis:    Component Value Date/Time   COLORURINE YELLOW 04/18/2017 2049   APPEARANCEUR CLEAR 04/18/2017 2049   LABSPEC 1.014 04/18/2017 2049   PHURINE 5.0 04/18/2017 2049   GLUCOSEU NEGATIVE 04/18/2017 2049   GLUCOSEU NEGATIVE 09/22/2014 0757   HGBUR NEGATIVE 04/18/2017 2049   BILIRUBINUR NEGATIVE 04/18/2017 2049   BILIRUBINUR n 04/30/2011 1534   KETONESUR 20 (A) 04/18/2017 2049   PROTEINUR NEGATIVE 04/18/2017 2049   UROBILINOGEN 0.2 09/22/2014 0757   NITRITE NEGATIVE 04/18/2017 2049   LEUKOCYTESUR NEGATIVE 04/18/2017 2049   Sepsis Labs: Invalid input(s): PROCALCITONIN, LACTICIDVEN  No results found for this or any previous visit (from the past 240 hour(s)).    Radiology Studies: Dg Chest 2 View  Result Date: 04/18/2017 CLINICAL DATA:  Tachycardic and failure to thrive.  Achalasia. EXAM: CHEST  2 VIEW COMPARISON:  01/18/2017 and CT 01/23/2017 FINDINGS: Lungs are adequately inflated without focal consolidation or effusion. Possible nodule opacity in the retrosternal area. Cardiomediastinal silhouette and remainder of the exam is unchanged. IMPRESSION: No acute cardiopulmonary disease. Possible nodular density in the retrosternal region. Recommend follow-up PA and lateral chest radiograph versus noncontrast chest CT on elective basis for further evaluation. Electronically  Signed   By: Marin Olp M.D.   On: 04/18/2017 19:40     Scheduled Meds: . feeding supplement  1 Container Oral TID BM  . insulin aspart  0-9 Units Subcutaneous Q4H   Continuous Infusions: . lactated ringers 100 mL/hr at 04/19/17 [place], MD, PhD [facility] Pager [phone]  If 7PM-7AM, please contact night-coverage [email or web address] Password [email or web address] 04/19/2017, 1:27 PM

## 2017-04-19 NOTE — Care Management Note (Signed)
Case Management Note  Patient Details  Name: [name] MRN: [id number] Date of Birth: 04/14/1940  Subjective/Objective:                  hypernatremia  Action/Plan: Date:  April 19, 2017 Chart reviewed for concurrent status and case management needs.  Will continue to follow patient progress.  Discharge Planning: following for needs.  None present at this time of review. Expected discharge date: April 22 2017 Velva Harman, BSN, [place], [place]   Expected Discharge Date:                  Expected Discharge Plan:  Home/Self Care  In-House Referral:     Discharge planning Services  CM Consult  Post Acute Care Choice:    Choice offered to:     DME Arranged:    DME Agency:     HH Arranged:    HH Agency:     Status of Service:  In process, will continue to follow  If discussed at Long Length of Stay Meetings, dates discussed:    Additional Comments:  Leeroy Cha, RN 04/19/2017, 9:45 AM

## 2017-04-19 NOTE — ED Notes (Signed)
CBG 134 

## 2017-04-19 NOTE — Consult Note (Signed)
Consultation  Referring Provider:  Dr. Cruzita Lederer    Primary Care Physician:  Lance Sell, NP Primary Gastroenterologist:  Dr. Silverio Decamp       Reason for Consultation: Dysphagia          HPI:   [name] is a 77 y.o. 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    Per review of chart, patient was last seen in clinic by Dr. Silverio Decamp and at that time was set up for Pneumatic balloon dilation with general anesthesia at [facility] on 04/22/17.    Today, the patient tells me that "I do not know what everyone is talking about".  He tells me that he can "eat fine, the food just comes up about 20-30 minutes after I eat".  Patient describes that liquids and solids will both come back up about 20-30 minutes after swallowing.  This apparently has gotten worse over the past week and per his wife and son, who are not in the room, patient has been unable to tolerate his medications.  They became worried about him due to weakness.  Patient denies any abdominal pain.    Patient denies fever, chills, abdominal pain or change in bowel habits.  ED Course: lab work c/w severe dehydration: sodium 152, cl 112, creat 2.5 up from ~1.8 baseline.  BUN 67 up from 36 in Dec.  Given 2L NS bolus.  Previous GI history:  02/17/2016 EGD. Dr. Ardis Hughs, for anemia, FOBT positive stool. Very dilated, fluid-filled esophagus cleared with cough and at, lavage, suctioning following airway intubation. Suspect this is due to achalasia. Mucosa at GE junction was abnormal, the lumen was narrowed. Biopsies obtained and showed reactive changes/inflammation.. 02/21/16 EGD. Dr. Hilarie Fredrickson. Esophageal lumen severely dilated with pooled liquid and debris including medication and pills. 500 mL of fluid aspirated. Severe esophagitis in the middle and lower third of the esophagus and at the GE junction.  Gastritis also noted. Due to patient having hypotension during the procedure and possibly a developing sepsis, plans for Botox to the LES was aborted. 02/28/16 EGD. Dr. Havery Moros. Upper and middle esophagus dilated with residual liquid contents requiring suctioning. Esophagitis appeared to be improved. Large amount of food residue noted in the stomach. Botox was injected to the LES. 04/13/2016 EGD with Dr. Carlean Purl: dilated esophagus, fluid and food within, cleared. GE junction dilated with TTS balloon to 89mm and then botox injected again; recommended strict liquid diet, consider PEG in future 01/02/2017 EGD Dr Havery Moros - Dilation in the entire esophagus consistent with achalasia. - Hypertonic LES. Dilated to 82mm, injected with 100 units of Botox. - Esophagitis due to stasis. Biopsied. - Multiple gastric polyps. Biopsied. - Normal duodenal bulb and second portion of the duodenum.  Barium esophagram 04/2016 The patient swallowed several mouthfuls of water-soluble contrast. The contrast pooled in the distal esophagus with esophageal distention and no emptying into the stomach. No further imaging was obtained due to lack of emptying into the stomach.  IMPRESSION: Distal esophageal obstruction with no emptying into the stomach. The findings are likely due to the patient's reported history of achalasia.  Past Medical History:  Diagnosis Date  . Anemia    IRON TRANSFUSION 1 MONTH AGO  . Blood clot in vein 2008   LEFT LEG  . Cataract    LEFT  . CKD (chronic kidney disease) 02/2016  . Diabetes mellitus,  type 2 ([place]) 2008  . GERD (gastroesophageal reflux disease)   . Hyperlipidemia 2008  . Hypertension 2008  . Pneumonia 01/18/2016  . Prostate cancer ([place]) 2006   IMPLANT IN LEFT ARM FOR [place]    Past Surgical History:  Procedure Laterality Date  . BALLOON DILATION N/A 04/13/2016   Procedure: BALLOON DILATION;  Surgeon: Gatha Mayer, MD;  Location: [facility] ENDOSCOPY;  Service: Endoscopy;   Laterality: N/A;  . BOTOX INJECTION N/A 04/13/2016   Procedure: BOTOX INJECTION;  Surgeon: Gatha Mayer, MD;  Location: [place];  Service: Endoscopy;  Laterality: N/A;  . BOTOX INJECTION N/A 01/02/2017   Procedure: BOTOX INJECTION;  Surgeon: Yetta Flock, MD;  Location: [facility] ENDOSCOPY;  Service: Gastroenterology;  Laterality: N/A;  . COLONOSCOPY W/ POLYPECTOMY  03/2008   diminutive rectal polyp (path: prolapse type polyp, not adenoma).  moderate pan-diverticulitis.   Mar[facility] Kitchen ESOPHAGEAL MANOMETRY  1984   unable to pass manometry catheter beyond LES. Aperiystalsis of esophagus: sugg of achalasia.   Marland Kitchen ESOPHAGOGASTRODUODENOSCOPY N/A 02/17/2016   Procedure: ESOPHAGOGASTRODUODENOSCOPY (EGD);  Surgeon: [name], MD;  Location: [facility] END[facility];  Service: Endoscopy;  Laterality: N/A;  . ESOPHAGOGASTRODUODENOSCOPY (EGD) WITH PROPOFOL N/A 02/21/2016   Procedure: ESOPHAGOGASTRODUODENOSCOPY (EGD) WITH PROPOFOL;  Surgeon: Jerene Bears, MD;  Location: [facility] ENDOSCOPY;  Service: Gastroenterology;  Laterality: N/A;  . ESOPHAGOGASTRODUODENOSCOPY (EGD) WITH PROPOFOL N/A 02/28/2016   Procedure: ESOPHAGOGASTRODUODENOSCOPY (EGD) WITH PROPOFOL withBOTOX;  Surgeon: Manus Gunning, MD;  Location: [facility] ENDOSCOPY;  Service: Gastroenterology;  Laterality: N/A;  . ESOPHAGOGASTRODUODENOSCOPY (EGD) WITH PROPOFOL N/A 04/13/2016   Procedure: ESOPHAGOGASTRODUODENOSCOPY (EGD) WITH PROPOFOL;  Surgeon: Gatha Mayer, MD;  Location: [place];  Service: Endoscopy;  Laterality: N/A;  . ESOPHAGOGASTRODUODENOSCOPY (EGD) WITH PROPOFOL N/A 01/02/2017   Procedure: ESOPHAGOGASTRODUODENOSCOPY (EGD) WITH PROPOFOL;  Surgeon: Yetta Flock, MD;  Location: [facility] ENDOSCOPY;  Service: Gastroenterology;  Laterality: N/A;  . LEFT ARM IMPLANT X2     RADIATION TX ALSO    Family History  Problem Relation Age of Onset  . Diabetes Father   . Diabetes Sister   . Hypertension Sister   . Hypertension Brother   . Heart disease Neg Hx    . Colon cancer Neg Hx   . Stomach cancer Neg Hx      Social History   Tobacco Use  . Smoking status: Former Smoker    Years: 10.00    Last attempt to quit: 2000    Years since quitting: 19.0  . Smokeless tobacco: Never Used  Substance Use Topics  . Alcohol use: No  . Drug use: No    Prior to Admission medications   Medication Sig Start Date End Date Taking? Authorizing Provider  ACCU-CHEK FASTCLIX LANCETS MISC Use to check blood sugars twice daily E11.65 11/08/16  Yes Elayne Snare, MD  ACCU-CHEK FASTCLIX LANCETS MISC USE TO CHECK BLOOD SUGARS TWICE DAILY E11.65 02/11/17  Yes Elayne Snare, MD  Amino Acids-Protein Hydrolys (FEEDING SUPPLEMENT, PRO-STAT SUGAR FREE 64,) LIQD Take 30 mLs by mouth 2 (two) times daily. Patient taking differently: Take 30 mLs by mouth daily.  04/13/16  Yes Dessa Phi, DO  atorvastatin (LIPITOR) 10 MG tablet Take 1 tablet (10 mg total) by mouth at bedtime. 07/09/16  Yes Elayne Snare, MD  docusate sodium (COLACE) 100 MG capsule TAKE ONE CAPSULE BY MOUTH TWICE A DAY FOR CONSTIPATION 07/04/16  Yes Elayne Snare, MD  enzalutamide Gillermina Phy) 40 MG capsule Take 160 mg by mouth daily.   Yes [provider]  feeding supplement, ENSURE ENLIVE, (ENSURE ENLIVE) LIQD Take 237 mLs by mouth 2 (two) times daily between meals. 01/26/17  Yes Florencia Reasons, MD  ferrous sulfate 325 (65 FE) MG tablet TAKE 1 TABLET BY MOUTH EVERY DAY FOR SUPPLEMENT 02/20/17  Yes Elayne Snare, MD  furosemide (LASIX) 80 MG tablet Take 80 mg by mouth daily. 03/26/17  Yes [provider]  glipiZIDE (GLUCOTROL XL) 5 MG 24 hr tablet Take 5 mg by mouth daily with breakfast.   Yes [provider]  glucose blood test strip Use as instructed 11/08/16  Yes Elayne Snare, MD  metoprolol tartrate (LOPRESSOR) 25 MG tablet Take 0.5 tablets (12.5 mg total) by mouth 2 (two) times daily. 04/11/17  Yes Lance Sell, NP  Multiple Vitamin (MULTIVITAMIN WITH MINERALS) TABS tablet Take 1 tablet by mouth  daily.   Yes [provider]  ondansetron (ZOFRAN-ODT) 4 MG disintegrating tablet Take 1 tablet (4 mg total) by mouth every 8 (eight) hours as needed for nausea or vomiting. 01/18/17  Yes Golden Circle, FNP  pantoprazole (PROTONIX) 40 MG tablet TAKE 1 TABLET TWICE A DAY 02/14/17  Yes Elayne Snare, MD  pioglitazone (ACTOS) 15 MG tablet Take 1 tablet (15 mg total) daily by mouth. 02/12/17  Yes Elayne Snare, MD  TRADJENTA 5 MG TABS tablet TAKE 1 TABLET (5 MG TOTAL) BY MOUTH DAILY. 04/15/17  Yes Elayne Snare, MD    Current Facility-Administered Medications  Medication Dose Route Frequency Provider Last Rate Last Dose  . acetaminophen (TYLENOL) tablet 650 mg  650 mg Oral Q6H PRN Etta Quill, DO       Or  . acetaminophen (TYLENOL) suppository 650 mg  650 mg Rectal Q6H PRN Etta Quill, DO      . hydrALAZINE (APRESOLINE) injection 10 mg  10 mg Intravenous Q4H PRN Etta Quill, DO      . insulin aspart (novoLOG) injection 0-9 Units  0-9 Units Subcutaneous Q4H Jennette Kettle M, DO      . lactated ringers infusion   Intravenous Continuous Duffy Bruce, MD 100 mL/hr at 04/19/17 5188    . LORazepam (ATIVAN) injection 0.5 mg  0.5 mg Intravenous Q6H PRN Etta Quill, DO   0.5 mg at 04/19/17 4166    Allergies as of 04/18/2017  . (No Known Allergies)     Review of Systems:    Constitutional: No weight loss, fever or chills Skin: No rash  Cardiovascular: No chest pain Respiratory: No SOB  Gastrointestinal: See HPI and otherwise negative Genitourinary: No dysuria  Neurological: No headache, dizziness or syncope Musculoskeletal: No new muscle or joint pain Hematologic: No bleeding  Psychiatric: No history of depression or anxiety   Physical Exam:  Vital signs in last 24 hours: Temp:  [96.8 F (36 C)-97.9 F (36.6 C)] 97.9 F (36.6 C) (01/10 2217) Pulse Rate:  [76-107] 76 (01/11 0739) Resp:  [13-28] 21 (01/11 0739) BP: (109-178)/(49-95) 178/49 (01/11 0739) SpO2:  [99  %-100 %] 100 % (01/11 0739) Weight:  [119 lb (54 kg)] 119 lb (54 kg) (01/10 1746)   General:   Pleasant AA male appears to be in NAD, Well developed, Well nourished, alert and cooperative Head:  Normocephalic and atraumatic. Eyes:   PEERL, EOMI. No icterus. Conjunctiva pink. Ears:  Normal auditory acuity. Neck:  Supple Throat: Oral cavity and pharynx without inflammation, swelling or lesion.  Lungs: Respirations even and unlabored. Lungs clear to auscultation bilaterally.   No wheezes, crackles, or rhonchi.  Heart: Normal S1, S2. No MRG. Regular rate and rhythm. No peripheral edema, cyanosis or pallor.  Abdomen:  Soft, nondistended, nontender. No rebound or guarding. Normal bowel sounds. No appreciable masses or hepatomegaly. Rectal:  Not performed.  Msk:  Symmetrical without gross deformities.   Extremities:  Without edema, no deformity or joint abnormality. Normal ROM, normal sensation. Neurologic:  Alert and  oriented x4;  grossly normal neurologically.  Skin:   Dry and intact without significant lesions or rashes. Psychiatric: Demonstrates good judgement and reason without abnormal affect or behaviors.  LAB RESULTS: Recent Labs    04/18/17 2049 04/19/17 0540  WBC 7.9 8.1  HGB 9.3* 8.6*  HCT 28.2* 26.7*  PLT 265 237   BMET Recent Labs    04/18/17 1832 04/19/17 0540  NA 152* 152*  K 3.6 3.4*  CL 112* 114*  CO2 21* 23  GLUCOSE 238* 154*  BUN 67* 58*  CREATININE 2.58* 2.09*  CALCIUM 8.7* 8.5*   LFT Recent Labs    04/18/17 2107  PROT 4.6*  ALBUMIN 2.0*  AST 38  ALT 8*  ALKPHOS 77  BILITOT 1.5*  BILIDIR 0.5  IBILI 1.0*   STUDIES: Dg Chest 2 View  Result Date: 04/18/2017 CLINICAL DATA:  Tachycardic and failure to thrive.  Achalasia. EXAM: CHEST  2 VIEW COMPARISON:  01/18/2017 and CT 01/23/2017 FINDINGS: Lungs are adequately inflated without focal consolidation or effusion. Possible nodule opacity in the retrosternal area. Cardiomediastinal silhouette and  remainder of the exam is unchanged. IMPRESSION: No acute cardiopulmonary disease. Possible nodular density in the retrosternal region. Recommend follow-up PA and lateral chest radiograph versus noncontrast chest CT on elective basis for further evaluation. Electronically Signed   By: Marin Olp M.D.   On: 04/18/2017 19:40   PREVIOUS ENDOSCOPIES:            See HPI   Impression / Plan:   Impression: 1. Dysphagia: h/o achalasia and esophageal stricture, patient scheduled for pneumatic balloon dilation by Dr. Silverio Decamp on 04/22/17  Plan: 1.  At this time, agree with supportive measures until Monday when patient can have his scheduled pneumatic balloon dilation with Dr. Silverio Decamp.  This is a specialized procedure which requires general anesthesia and is only performed by certain gastroenterologists.  For this reason, patient will wait until Monday. 2.  Patient may have clear liquid diet/pured food until then as tolerated 3.  Patient will be n.p.o. after midnight on Sunday in preparation for his procedure Monday 4.  We will likely not follow the patient over the weekend, but please call us with any questions. 5.  Please await any final recommendations from Dr. Henrene Pastor later today  Thank you for your kind consultation, we will continue to follow.  Lavone Nian Lemmon  04/19/2017, 9:08 AM Pager #: [phone]  GI ATTENDING  History, laboratories, x-rays, prior endoscopy reports reviewed. Patient personally seen and examined. Agree with comprehensive consultation note as outlined above. Patient with chronic achalasia. Seems to be failing to thrive his outpatient. Admitted with dehydration and poor oral intake related to his achalasia. He has are been scheduled for upper endoscopy with pneumatic dilation under general anesthesia for this Monday. As such, I recommend that he stay in the hospital, continue to receive adequate IV hydration, and have a diet that is limited to clear liquids (in order to  keep food stuff from building up in his esophagus). The GI physician assistant we will check on the patient Sunday. If there are interval questions or problems  feel free to call.  Docia Chuck. Geri Seminole., M.D. [facility] Division of Gastroenterology.

## 2017-04-19 NOTE — ED Notes (Signed)
Date and time results received: 04/19/17 0154  Test: Latic Critical Value: 3.9  Name of Provider Notified: Fabio Neighbors, MD

## 2017-04-19 NOTE — Progress Notes (Addendum)
Initial Nutrition Assessment  DOCUMENTATION CODES:   Underweight, Severe malnutrition in context of chronic illness  INTERVENTION:    Monitor for diet advancement/toleration  Boost Plus chocolate Q24- Each supplement provides 360kcal and 14g protein.   Magic cup TID with meals, each supplement provides 290 kcal and 9 grams of protein   NUTRITION DIAGNOSIS:   Severe Malnutrition related to chronic illness(prostate cancer/achalasia) as evidenced by severe fat depletion, severe muscle depletion, moderate fat depletion, moderate muscle depletion.  GOAL:   Patient will meet greater than or equal to 90% of their needs  MONITOR:   PO intake, Supplement acceptance, Weight trends, Labs  REASON FOR ASSESSMENT:   Malnutrition Screening Tool    ASSESSMENT:   Pt with PMH significant for CKD stage 3-4, DM, achalasia s/p dilation and Botox (04/13/16), HLD, HTN, prostate cancer (PO Xtandi), and recurrent admits for dehydration/generalized weakness. Presents this admission with vomiting undigested food upon eating. Plan for EGD 1/11.    Pt reports having great appetite prior to admission eating more solid foods around the holiday's without complication. Within the last few days pt has experience vomiting upon anything taken PO. Pt denies that food is hard to swallow, but often times his food comes up undigested. Pt typically has one soft food meal per day that consists of eggs/grits, and Ensure x2. Pt willing to try Magic Cups once diet is advanced. Does not like Ensure Enlive. Will trial Boost Plus for extra calories/protein.   Reports a UBW of 165 lb. Pt noted to weigh 141 lb in January 2018 and 119 lb this stay. This shows a 15.6% wt loss in one year. Insignificant for time frame. Weight shows to be stable since last admission 01/26/17.   Nutrition-Focused physical exam completed. Pt continues to show moderate to severe muscle/fat depletions.   Medications reviewed and include: SSI, LR @  100 ml/hr Labs reviewed: Na 152 (H) K 3.4 (H) BUN 58 (H) Creatinine 2.09 (H)   NUTRITION - FOCUSED PHYSICAL EXAM:    Most Recent Value  Orbital Region  Mild depletion  Upper Arm Region  Severe depletion  Thoracic and Lumbar Region  Severe depletion  Buccal Region  Mild depletion  Temple Region  Severe depletion  Clavicle Bone Region  Severe depletion  Clavicle and Acromion Bone Region  Severe depletion  Scapular Bone Region  Severe depletion  Dorsal Hand  Moderate depletion  Patellar Region  Severe depletion  Anterior Thigh Region  Severe depletion  Posterior Calf Region  Severe depletion  Edema (RD Assessment)  None  Hair  Reviewed  Eyes  Reviewed  Mouth  Reviewed  Skin  Reviewed  Nails  Reviewed       Diet Order:  Diet clear liquid Room service appropriate? Yes; Fluid consistency: Thin  EDUCATION NEEDS:   Education needs have been addressed  Skin:  Skin Assessment: Reviewed RN Assessment  Last BM:  PTA  Height:   Ht Readings from Last 1 Encounters:  04/18/17 5\' 8"  (1.727 m)    Weight:   Wt Readings from Last 1 Encounters:  04/18/17 119 lb (54 kg)    Ideal Body Weight:  70 kg  BMI:  Body mass index is 18.09 kg/m.  Estimated Nutritional Needs:   Kcal:  2100-2300 kcal/day  Protein:  105-115 g/day  Fluid:  >2.1 L/day    Mariana Single RD, LDN Clinical Nutrition Pager # - [phone]

## 2017-04-19 NOTE — Progress Notes (Signed)
Repeat lactate has gone up slightly to 3.9 from 3.4.  Will order 500cc bolus for now.  Also ordering intake and output to monitor UOP.  No abd pain to suggest ischemic bowel.  No other SIRS.  In contrast to elevating lactate, denies complaints at this time.

## 2017-04-20 LAB — BASIC METABOLIC PANEL
ANION GAP: 10 (ref 5–15)
BUN: 45 mg/dL — ABNORMAL HIGH (ref 6–20)
CHLORIDE: 111 mmol/L (ref 101–111)
CO2: 26 mmol/L (ref 22–32)
Calcium: 8.5 mg/dL — ABNORMAL LOW (ref 8.9–10.3)
Creatinine, Ser: 1.89 mg/dL — ABNORMAL HIGH (ref 0.61–1.24)
GFR calc Af Amer: 38 mL/min — ABNORMAL LOW (ref >60)
GFR calc non Af Amer: 33 mL/min — ABNORMAL LOW (ref >60)
GLUCOSE: 87 mg/dL (ref 65–99)
POTASSIUM: 3 mmol/L — AB (ref 3.5–5.1)
Sodium: 147 mmol/L — ABNORMAL HIGH (ref 135–145)

## 2017-04-20 LAB — CBC
HCT: 22.3 % — ABNORMAL LOW (ref 39.0–52.0)
HEMOGLOBIN: 7.5 g/dL — AB (ref 13.0–17.0)
MCH: 32.8 pg (ref 26.0–34.0)
MCHC: 33.6 g/dL (ref 30.0–36.0)
MCV: 97.4 fL (ref 78.0–100.0)
Platelets: 200 10*3/uL (ref 150–400)
RBC: 2.29 MIL/uL — ABNORMAL LOW (ref 4.22–5.81)
RDW: 15.1 % (ref 11.5–15.5)
WBC: 4.5 10*3/uL (ref 4.0–10.5)

## 2017-04-20 LAB — GLUCOSE, CAPILLARY
GLUCOSE-CAPILLARY: 53 mg/dL — AB (ref 65–99)
GLUCOSE-CAPILLARY: 158 mg/dL — AB (ref 65–99)
Glucose-Capillary: 106 mg/dL — ABNORMAL HIGH (ref 65–99)
Glucose-Capillary: 111 mg/dL — ABNORMAL HIGH (ref 65–99)
Glucose-Capillary: 195 mg/dL — ABNORMAL HIGH (ref 65–99)
Glucose-Capillary: 90 mg/dL (ref 65–99)
Glucose-Capillary: 97 mg/dL (ref 65–99)

## 2017-04-20 LAB — PROCALCITONIN: Procalcitonin: 0.12 ng/mL

## 2017-04-20 MED ORDER — POTASSIUM CHLORIDE CRYS ER 20 MEQ PO TBCR
40.0000 meq | EXTENDED_RELEASE_TABLET | Freq: Two times a day (BID) | ORAL | Status: AC
  Administered 2017-04-20 (×2): 40 meq via ORAL
  Filled 2017-04-20 (×2): qty 2

## 2017-04-20 MED ORDER — DEXTROSE 50 % IV SOLN
INTRAVENOUS | Status: AC
  Administered 2017-04-20: 25 mL
  Filled 2017-04-20: qty 50

## 2017-04-20 MED ORDER — METOPROLOL TARTRATE 12.5 MG HALF TABLET
12.5000 mg | ORAL_TABLET | Freq: Two times a day (BID) | ORAL | Status: DC
  Administered 2017-04-20 – 2017-04-23 (×5): 12.5 mg via ORAL
  Filled 2017-04-20 (×6): qty 1

## 2017-04-20 NOTE — Progress Notes (Signed)
PROGRESS NOTE  [name] XBD:[id number] DOB: 10/23/1940 DOA: 04/18/2017 PCP: Lance Sell, NP   LOS: 2 days   Brief Narrative / Interim history: 77 y.o. 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  Recurrent admits for dehydration with generalized weakness.  Patient apparently has been eating more solid foods than he normally should due to the recent holidays.  Over the last couple of days however he has been unable to eat or drink anything though.  Gastroenterology was consulted and patient was admitted to the hospital.  Assessment & Plan: Principal Problem:   Achalasia Active Problems:   Type II diabetes mellitus with renal manifestations ([place])   Essential hypertension   Acute renal failure superimposed on stage 3 chronic kidney disease ([facility])   Dehydration   Hypernatremia    Achalasia/history of strictures -Gastroenterology consulted, plan for EGD on 1/14 per GI -Continue IV fluids, allow clear liquid diet  Acute kidney injury on chronic kidney disease stage III -Due to dehydration, continue IV fluids, BMP this morning stable stop baseline creatinine around 2-2.5, he is at 1.89 today, continue to monitor   Anemia of chronic disease -no bleeding, Hb 7.5, closely monitor   Hypernatremia -Due to poor p.o. Intake -improving, 147, continue fluids   Hypertension -Resume home metoprolol, likely needs to be crushed, hydralazine as needed for systolic greater than 834  Type 2 diabetes mellitus -Continue sliding scale as unclear p.o. intake, continue to monitor CBGs -hypoglycemic this morning due to poor po intake, continue to monitor   Prostate cancer -hold Xtandi   DVT prophylaxis: heparin (to be held on Sunday evening prior to EGD on Monday) Code Status: Full code Family Communication: No family at bedside Disposition Plan: Home when ready  Consultants:   Gastroenterology  Procedures:    None  Antimicrobials:  None    Subjective: -no complaints this morning, no abdominal pain, nausea  Objective: Vitals:   04/19/17 1357 04/19/17 2208 04/20/17 0447 04/20/17 1018  BP: (!) 108/58 109/74 (!) 105/59 100/68  Pulse: 88 89 64 85  Resp: 20 18 18    Temp: 97.8 F (36.6 C) 97.8 F (36.6 C) 98.4 F (36.9 C)   TempSrc:  Oral Oral   SpO2: 100% 100% 100%   Weight:      Height:        Intake/Output Summary (Last 24 hours) at 04/20/2017 1121 Last data filed at 04/20/2017 0831 Gross per 24 hour  Intake 3991.67 ml  Output 450 ml  Net 3541.67 ml   Filed Weights   04/18/17 1746  Weight: 54 kg (119 lb)    Examination:  Constitutional: No distress Eyes: No scleral icterus Respiratory: Clear to auscultation, no wheezing, no crackles Cardiovascular: RRR, no murmurs, no edema Abdomen: Soft, nontender, nondistended.  Positive bowel sounds Musculoskeletal: No clubbing/cyanosis Neurologic: Nonfocal    Data Reviewed: I have independently reviewed following labs and imaging studies   CBC: Recent Labs  Lab 04/18/17 2049 04/19/17 0540 04/20/17 0553  WBC 7.9 8.1 4.5  HGB 9.3* 8.6* 7.5*  HCT 28.2* 26.7* 22.3*  MCV 98.9 97.4 97.4  PLT 265 237 196   Basic Metabolic Panel: Recent Labs  Lab 04/18/17 1832 04/19/17 0540 04/20/17 0553  NA 152* 152* 147*  K 3.6 3.4* 3.0*  CL 112* 114* 111  CO2 21* 23 26  GLUCOSE 238* 154* 87  BUN 67* 58* 45*  CREATININE 2.58* 2.09* 1.89*  CALCIUM 8.7* 8.5* 8.5*   GFR: Estimated  Creatinine Clearance: 25.4 mL/min (A) (by C-G formula based on SCr of 1.89 mg/dL (H)). Liver Function Tests: Recent Labs  Lab 04/18/17 2107  AST 38  ALT 8*  ALKPHOS 77  BILITOT 1.5*  PROT 4.6*  ALBUMIN 2.0*   No results for input(s): LIPASE, AMYLASE in the last 168 hours. No results for input(s): AMMONIA in the last 168 hours. Coagulation Profile: No results for input(s): INR, PROTIME in the last 168 hours. Cardiac Enzymes: No results for  input(s): CKTOTAL, CKMB, CKMBINDEX, TROPONINI in the last 168 hours. BNP (last 3 results) No results for input(s): PROBNP in the last 8760 hours. HbA1C: No results for input(s): HGBA1C in the last 72 hours. CBG: Recent Labs  Lab 04/19/17 2146 04/20/17 0006 04/20/17 0440 04/20/17 0714 04/20/17 0817  GLUCAP 207* 195* 53* 97 90   Lipid Profile: No results for input(s): CHOL, HDL, LDLCALC, TRIG, CHOLHDL, LDLDIRECT in the last 72 hours. Thyroid Function Tests: No results for input(s): TSH, T4TOTAL, FREET4, T3FREE, THYROIDAB in the last 72 hours. Anemia Panel: No results for input(s): VITAMINB12, FOLATE, FERRITIN, TIBC, IRON, RETICCTPCT in the last 72 hours. Urine analysis:    Component Value Date/Time   COLORURINE YELLOW 04/18/2017 2049   APPEARANCEUR CLEAR 04/18/2017 2049   LABSPEC 1.014 04/18/2017 2049   PHURINE 5.0 04/18/2017 2049   GLUCOSEU NEGATIVE 04/18/2017 2049   GLUCOSEU NEGATIVE 09/22/2014 0757   HGBUR NEGATIVE 04/18/2017 2049   BILIRUBINUR NEGATIVE 04/18/2017 2049   BILIRUBINUR n 04/30/2011 1534   KETONESUR 20 (A) 04/18/2017 2049   PROTEINUR NEGATIVE 04/18/2017 2049   UROBILINOGEN 0.2 09/22/2014 0757   NITRITE NEGATIVE 04/18/2017 2049   LEUKOCYTESUR NEGATIVE 04/18/2017 2049   Sepsis Labs: Invalid input(s): PROCALCITONIN, LACTICIDVEN  No results found for this or any previous visit (from the past 240 hour(s)).    Radiology Studies: Dg Chest 2 View  Result Date: 04/18/2017 CLINICAL DATA:  Tachycardic and failure to thrive.  Achalasia. EXAM: CHEST  2 VIEW COMPARISON:  01/18/2017 and CT 01/23/2017 FINDINGS: Lungs are adequately inflated without focal consolidation or effusion. Possible nodule opacity in the retrosternal area. Cardiomediastinal silhouette and remainder of the exam is unchanged. IMPRESSION: No acute cardiopulmonary disease. Possible nodular density in the retrosternal region. Recommend follow-up PA and lateral chest radiograph versus noncontrast chest  CT on elective basis for further evaluation. Electronically Signed   By: Marin Olp M.D.   On: 04/18/2017 19:40     Scheduled Meds: . feeding supplement  1 Container Oral TID BM  . heparin injection (subcutaneous)  5,000 Units Subcutaneous Q8H  . insulin aspart  0-9 Units Subcutaneous Q4H  . metoprolol tartrate  12.5 mg Oral BID  . potassium chloride  40 mEq Oral BID   Continuous Infusions: . lactated ringers Stopped (04/20/17 1049)    Marzetta Board, MD, PhD [facility] Pager [phone]  If 7PM-7AM, please contact night-coverage [email or web address] Password [facility] 04/20/2017, 11:21 AM

## 2017-04-20 NOTE — Progress Notes (Signed)
Patient's CBG this AM (~@0435 ) was 53. Administered D50 per hypoglycemic protocol. Will recheck CBG

## 2017-04-21 DIAGNOSIS — R627 Adult failure to thrive: Secondary | ICD-10-CM

## 2017-04-21 DIAGNOSIS — R1310 Dysphagia, unspecified: Secondary | ICD-10-CM

## 2017-04-21 DIAGNOSIS — R1319 Other dysphagia: Secondary | ICD-10-CM

## 2017-04-21 LAB — BASIC METABOLIC PANEL
ANION GAP: 6 (ref 5–15)
BUN: 32 mg/dL — ABNORMAL HIGH (ref 6–20)
CALCIUM: 8.2 mg/dL — AB (ref 8.9–10.3)
CHLORIDE: 109 mmol/L (ref 101–111)
CO2: 25 mmol/L (ref 22–32)
Creatinine, Ser: 1.67 mg/dL — ABNORMAL HIGH (ref 0.61–1.24)
GFR calc Af Amer: 44 mL/min — ABNORMAL LOW (ref >60)
GFR calc non Af Amer: 38 mL/min — ABNORMAL LOW (ref >60)
GLUCOSE: 89 mg/dL (ref 65–99)
Potassium: 4.2 mmol/L (ref 3.5–5.1)
Sodium: 140 mmol/L (ref 135–145)

## 2017-04-21 LAB — CBC
HCT: 21.2 % — ABNORMAL LOW (ref 39.0–52.0)
HEMOGLOBIN: 7.1 g/dL — AB (ref 13.0–17.0)
MCH: 32.6 pg (ref 26.0–34.0)
MCHC: 33.5 g/dL (ref 30.0–36.0)
MCV: 97.2 fL (ref 78.0–100.0)
Platelets: 176 10*3/uL (ref 150–400)
RBC: 2.18 MIL/uL — ABNORMAL LOW (ref 4.22–5.81)
RDW: 14.9 % (ref 11.5–15.5)
WBC: 5 10*3/uL (ref 4.0–10.5)

## 2017-04-21 LAB — GLUCOSE, CAPILLARY
GLUCOSE-CAPILLARY: 101 mg/dL — AB (ref 65–99)
GLUCOSE-CAPILLARY: 122 mg/dL — AB (ref 65–99)
GLUCOSE-CAPILLARY: 150 mg/dL — AB (ref 65–99)
Glucose-Capillary: 90 mg/dL (ref 65–99)
Glucose-Capillary: 82 mg/dL (ref 65–99)
Glucose-Capillary: 126 mg/dL — ABNORMAL HIGH (ref 65–99)
Glucose-Capillary: 69 mg/dL (ref 65–99)
Glucose-Capillary: 77 mg/dL (ref 65–99)

## 2017-04-21 MED ORDER — DEXTROSE IN LACTATED RINGERS 5 % IV SOLN
INTRAVENOUS | Status: DC
  Administered 2017-04-21: 15:00:00 via INTRAVENOUS

## 2017-04-21 MED ORDER — ENSURE ENLIVE PO LIQD
237.0000 mL | Freq: Three times a day (TID) | ORAL | Status: DC
  Administered 2017-04-21 – 2017-04-23 (×4): 237 mL via ORAL

## 2017-04-21 NOTE — Progress Notes (Signed)
    Progress Note   Subjective  Chief Complaint: Dysphagia  Today, the patient is found sitting up in bed eating/drinking his clear liquid diet.  He tells me that he is doing well and is ready for his procedure tomorrow.  He denies any new complaints.   Objective   Vital signs in last 24 hours: Temp:  [98 F (36.7 C)-98.4 F (36.9 C)] 98.4 F (36.9 C) (01/12 2013) Pulse Rate:  [75-85] 78 (01/12 2013) Resp:  [16-18] 16 (01/12 2013) BP: (100-107)/(67-71) 106/71 (01/12 2013) SpO2:  [99 %-100 %] 99 % (01/12 2013) Last BM Date: 04/20/17 General:   AA male in NAD Heart:  Regular rate and rhythm; no murmurs Lungs: Respirations even and unlabored, lungs CTA bilaterally Abdomen:  Soft, nontender and nondistended. Normal bowel sounds. Extremities:  Without edema. Neurologic:  Alert and oriented,  grossly normal neurologically. Psych:  Cooperative. Normal mood and affect.  Intake/Output from previous day: 01/12 0701 - 01/13 0700 In: 3670 [P.O.:780; I.V.:2890] Out: 1375 [Urine:1375] Intake/Output this shift: Total I/O In: 380 [P.O.:380] Out: -   Lab Results: Recent Labs    04/19/17 0540 04/20/17 0553 04/21/17 0650  WBC 8.1 4.5 5.0  HGB 8.6* 7.5* 7.1*  HCT 26.7* 22.3* 21.2*  PLT 237 200 176   BMET Recent Labs    04/19/17 0540 04/20/17 0553 04/21/17 0650  NA 152* 147* 140  K 3.4* 3.0* 4.2  CL 114* 111 109  CO2 23 26 25   GLUCOSE 154* 87 89  BUN 58* 45* 32*  CREATININE 2.09* 1.89* 1.67*  CALCIUM 8.5* 8.5* 8.2*   LFT Recent Labs    04/18/17 2107  PROT 4.6*  ALBUMIN 2.0*  AST 38  ALT 8*  ALKPHOS 77  BILITOT 1.5*  BILIDIR 0.5  IBILI 1.0*     Assessment / Plan:    Assessment: 1.  Dysphagia: History of achalasia and esophageal stricture, patient scheduled for pneumatic balloon dilation by Dr. Silverio Decamp tomorrow  Plan: 1.  Continue supportive measures until tomorrow 2.  Patient will be n.p.o. after midnight, may remain on clear liquid diet today  Thank  you for your kind consultation, we will continue to follow along.  LOS: 3 days   [name]  04/21/2017, 9:44 AM  Pager # [phone]  GI ATTENDING  Interval history data reviewed. Patient seen and examined. Patient stable. EGD with pneumatic dilation for achalasia with general anesthesia assistance tomorrow with Dr. Silverio Decamp as previously arranged.  Docia Chuck. Geri Seminole., M.D. [facility] Division of Gastroenterology

## 2017-04-21 NOTE — Progress Notes (Signed)
PROGRESS NOTE  [name] DOB: Oct 19, 1940 DOA: 04/18/2017 PCP: Lance Sell, NP   LOS: 3 days   Brief Narrative / Interim history: 77 y.o. 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  Recurrent admits for dehydration with generalized weakness.  Patient apparently has been eating more solid foods than he normally should due to the recent holidays.  Over the last couple of days however he has been unable to eat or drink anything though.  Gastroenterology was consulted and patient was admitted to the hospital.  Assessment & Plan: Principal Problem:   Achalasia Active Problems:   Type II diabetes mellitus with renal manifestations ([place])   Essential hypertension   Acute renal failure superimposed on stage 3 chronic kidney disease (HCC)   Dehydration   Hypernatremia    Achalasia/history of strictures -Gastroenterology consulted, plan for EGD on 1/14 per GI -Continue IV fluids, allow clear liquid diet  Acute kidney injury on chronic kidney disease stage III -Due to dehydration, continue IV fluids, BMP this morning stable -baseline creatinine around 2-2.5 -Creatinine continues to improve this morning at 1.67, he has some p.o. intake, discontinue fluids  Anemia of chronic disease -no bleeding, Hb 7.1, suspect dilutional, monitor   Hypernatremia -Due to poor p.o. Intake -improving, 140, discontinue fluids today  Hypertension -Resume home metoprolol, likely needs to be crushed, hydralazine as needed for systolic greater than 735  Type 2 diabetes mellitus -Continue sliding scale as unclear p.o. intake, continue to monitor CBGs -Fasting CBG 82, continue current regimen,  Prostate cancer -hold Xtandi   DVT prophylaxis: heparin (to be held on Sunday evening prior to EGD on Monday) Code Status: Full code Family Communication: No family at bedside Disposition Plan: Home when ready  Consultants:    Gastroenterology  Procedures:   None  Antimicrobials:  None    Subjective: -Once insurance that of boost, otherwise he has no complaints, no abdominal pain, no nausea or vomiting.  Seems to be tolerating a clear liquid diet well  Objective: Vitals:   04/20/17 1018 04/20/17 1400 04/20/17 2013 04/21/17 0954  BP: 100/68 107/67 106/71 125/75  Pulse: 85 75 78 75  Resp:  18 16   Temp:  98 F (36.7 C) 98.4 F (36.9 C)   TempSrc:  Oral Oral   SpO2:  100% 99%   Weight:      Height:        Intake/Output Summary (Last 24 hours) at 04/21/2017 1015 Last data filed at 04/21/2017 3299 Gross per 24 hour  Intake 4050 ml  Output 925 ml  Net 3125 ml   Filed Weights   04/18/17 1746  Weight: 54 kg (119 lb)    Examination:  Constitutional: NAD Eyes: No icterus, lids and conjunctivae normal Respiratory: Clear to auscultation, no wheezing, no crackles, moves air well Cardiovascular: Regular rate and rhythm, no murmurs heard.  No lower extremity edema. Abdomen: Soft, nontender, nondistended, no guarding or rigidity.  Positive bowel sounds Musculoskeletal: Decreased muscle mass Neurologic: Equal strength, nonfocal, patient ambulatory    Data Reviewed: I have independently reviewed following labs and imaging studies   CBC: Recent Labs  Lab 04/18/17 2049 04/19/17 0540 04/20/17 0553 04/21/17 0650  WBC 7.9 8.1 4.5 5.0  HGB 9.3* 8.6* 7.5* 7.1*  HCT 28.2* 26.7* 22.3* 21.2*  MCV 98.9 97.4 97.4 97.2  PLT 265 237 200 242   Basic Metabolic Panel: Recent Labs  Lab 04/18/17 1832 04/19/17 0540 04/20/17 0553 04/21/17 6834  NA 152* 152* 147* 140  K 3.6 3.4* 3.0* 4.2  CL 112* 114* 111 109  CO2 21* 23 26 25   GLUCOSE 238* 154* 87 89  BUN 67* 58* 45* 32*  CREATININE 2.58* 2.09* 1.89* 1.67*  CALCIUM 8.7* 8.5* 8.5* 8.2*   GFR: Estimated Creatinine Clearance: 28.7 mL/min (A) (by C-G formula based on SCr of 1.67 mg/dL (H)). Liver Function Tests: Recent Labs  Lab 04/18/17 2107   AST 38  ALT 8*  ALKPHOS 77  BILITOT 1.5*  PROT 4.6*  ALBUMIN 2.0*   No results for input(s): LIPASE, AMYLASE in the last 168 hours. No results for input(s): AMMONIA in the last 168 hours. Coagulation Profile: No results for input(s): INR, PROTIME in the last 168 hours. Cardiac Enzymes: No results for input(s): CKTOTAL, CKMB, CKMBINDEX, TROPONINI in the last 168 hours. BNP (last 3 results) No results for input(s): PROBNP in the last 8760 hours. HbA1C: No results for input(s): HGBA1C in the last 72 hours. CBG: Recent Labs  Lab 04/20/17 1651 04/20/17 2009 04/21/17 0006 04/21/17 0410 04/21/17 0755  GLUCAP 106* 111* 101* 90 82   Lipid Profile: No results for input(s): CHOL, HDL, LDLCALC, TRIG, CHOLHDL, LDLDIRECT in the last 72 hours. Thyroid Function Tests: No results for input(s): TSH, T4TOTAL, FREET4, T3FREE, THYROIDAB in the last 72 hours. Anemia Panel: No results for input(s): VITAMINB12, FOLATE, FERRITIN, TIBC, IRON, RETICCTPCT in the last 72 hours. Urine analysis:    Component Value Date/Time   COLORURINE YELLOW 04/18/2017 2049   APPEARANCEUR CLEAR 04/18/2017 2049   LABSPEC 1.014 04/18/2017 2049   PHURINE 5.0 04/18/2017 2049   GLUCOSEU NEGATIVE 04/18/2017 2049   GLUCOSEU NEGATIVE 09/22/2014 0757   HGBUR NEGATIVE 04/18/2017 2049   BILIRUBINUR NEGATIVE 04/18/2017 2049   BILIRUBINUR n 04/30/2011 1534   KETONESUR 20 (A) 04/18/2017 2049   PROTEINUR NEGATIVE 04/18/2017 2049   UROBILINOGEN 0.2 09/22/2014 0757   NITRITE NEGATIVE 04/18/2017 2049   LEUKOCYTESUR NEGATIVE 04/18/2017 2049   Sepsis Labs: Invalid input(s): PROCALCITONIN, LACTICIDVEN  No results found for this or any previous visit (from the past 240 hour(s)).    Radiology Studies: No results found.   Scheduled Meds: . feeding supplement (ENSURE ENLIVE)  237 mL Oral TID BM  . insulin aspart  0-9 Units Subcutaneous Q4H  . metoprolol tartrate  12.5 mg Oral BID   Continuous Infusions: . lactated  ringers 100 mL/hr at 04/21/17 0955    Kevin Board, MD, PhD [facility] Pager [phone]  If 7PM-7AM, please contact night-coverage [email or web address] Password [facility] 04/21/2017, 10:15 AM

## 2017-04-21 NOTE — Progress Notes (Signed)
Hypoglycemic Event  CBG: 69  Treatment: 15 GM carbohydrate snack  Symptoms: None  Follow-up CBG: Time:1500 CBG Result:77  Possible Reasons for Event: Inadequate meal intake  Comments/MD notified:Pt is now eating dinner     Kizzie Ide

## 2017-04-22 ENCOUNTER — Inpatient Hospital Stay (HOSPITAL_COMMUNITY): Payer: Medicare Other

## 2017-04-22 ENCOUNTER — Inpatient Hospital Stay (HOSPITAL_COMMUNITY): Payer: Medicare Other | Admitting: Anesthesiology

## 2017-04-22 ENCOUNTER — Ambulatory Visit (HOSPITAL_COMMUNITY): Payer: Medicare Other

## 2017-04-22 ENCOUNTER — Encounter (HOSPITAL_COMMUNITY): Payer: Self-pay | Admitting: *Deleted

## 2017-04-22 ENCOUNTER — Ambulatory Visit (HOSPITAL_COMMUNITY): Admission: RE | Admit: 2017-04-22 | Payer: Medicare Other | Source: Ambulatory Visit | Admitting: Gastroenterology

## 2017-04-22 ENCOUNTER — Encounter (HOSPITAL_COMMUNITY)
Admission: EM | Disposition: A | Discharge status: Home Health | Payer: Self-pay | Source: Home / Self Care | Attending: Internal Medicine

## 2017-04-22 DIAGNOSIS — T18128A Food in esophagus causing other injury, initial encounter: Secondary | ICD-10-CM

## 2017-04-22 DIAGNOSIS — K228 Other specified diseases of esophagus: Secondary | ICD-10-CM

## 2017-04-22 HISTORY — PX: BALLOON DILATION: SHX5330

## 2017-04-22 HISTORY — PX: ESOPHAGOGASTRODUODENOSCOPY: SHX5428

## 2017-04-22 LAB — CBC
HCT: 25.1 % — ABNORMAL LOW (ref 39.0–52.0)
Hemoglobin: 8.5 g/dL — ABNORMAL LOW (ref 13.0–17.0)
MCH: 32.6 pg (ref 26.0–34.0)
MCHC: 33.9 g/dL (ref 30.0–36.0)
MCV: 96.2 fL (ref 78.0–100.0)
PLATELETS: 197 10*3/uL (ref 150–400)
RBC: 2.61 MIL/uL — ABNORMAL LOW (ref 4.22–5.81)
RDW: 14.5 % (ref 11.5–15.5)
WBC: 10.8 10*3/uL — ABNORMAL HIGH (ref 4.0–10.5)

## 2017-04-22 LAB — BASIC METABOLIC PANEL
Anion gap: 9 (ref 5–15)
BUN: 31 mg/dL — AB (ref 6–20)
CALCIUM: 8.5 mg/dL — AB (ref 8.9–10.3)
CO2: 24 mmol/L (ref 22–32)
CREATININE: 1.8 mg/dL — AB (ref 0.61–1.24)
Chloride: 106 mmol/L (ref 101–111)
GFR calc Af Amer: 40 mL/min — ABNORMAL LOW (ref >60)
GFR calc non Af Amer: 35 mL/min — ABNORMAL LOW (ref >60)
GLUCOSE: 130 mg/dL — AB (ref 65–99)
Potassium: 4.6 mmol/L (ref 3.5–5.1)
Sodium: 139 mmol/L (ref 135–145)

## 2017-04-22 LAB — PROTIME-INR
INR: 0.97
Prothrombin Time: 12.8 seconds (ref 11.4–15.2)

## 2017-04-22 LAB — GLUCOSE, CAPILLARY
GLUCOSE-CAPILLARY: 180 mg/dL — AB (ref 65–99)
Glucose-Capillary: 104 mg/dL — ABNORMAL HIGH (ref 65–99)
Glucose-Capillary: 84 mg/dL (ref 65–99)
Glucose-Capillary: 156 mg/dL — ABNORMAL HIGH (ref 65–99)

## 2017-04-22 SURGERY — EGD (ESOPHAGOGASTRODUODENOSCOPY)
Anesthesia: General

## 2017-04-22 MED ORDER — PHENYLEPHRINE 40 MCG/ML (10ML) SYRINGE FOR IV PUSH (FOR BLOOD PRESSURE SUPPORT)
PREFILLED_SYRINGE | INTRAVENOUS | Status: DC | PRN
  Administered 2017-04-22 (×4): 120 ug via INTRAVENOUS

## 2017-04-22 MED ORDER — LIDOCAINE 2% (20 MG/ML) 5 ML SYRINGE
INTRAMUSCULAR | Status: DC | PRN
  Administered 2017-04-22: 60 mg via INTRAVENOUS

## 2017-04-22 MED ORDER — PROPOFOL 10 MG/ML IV BOLUS
INTRAVENOUS | Status: AC
  Filled 2017-04-22: qty 20

## 2017-04-22 MED ORDER — SUCCINYLCHOLINE CHLORIDE 200 MG/10ML IV SOSY
PREFILLED_SYRINGE | INTRAVENOUS | Status: DC | PRN
  Administered 2017-04-22: 140 mg via INTRAVENOUS

## 2017-04-22 MED ORDER — ALBUMIN HUMAN 5 % IV SOLN
INTRAVENOUS | Status: DC | PRN
  Administered 2017-04-22: 10:00:00 via INTRAVENOUS

## 2017-04-22 MED ORDER — IOPAMIDOL (ISOVUE-300) INJECTION 61%
50.0000 mL | Freq: Once | INTRAVENOUS | Status: AC | PRN
  Administered 2017-04-22: 40 mL via ORAL

## 2017-04-22 MED ORDER — PROPOFOL 10 MG/ML IV BOLUS
INTRAVENOUS | Status: DC | PRN
  Administered 2017-04-22: 20 mg via INTRAVENOUS
  Administered 2017-04-22: 120 mg via INTRAVENOUS

## 2017-04-22 MED ORDER — SODIUM CHLORIDE 0.9 % IV SOLN
INTRAVENOUS | Status: DC
  Administered 2017-04-22 (×2): via INTRAVENOUS

## 2017-04-22 MED ORDER — LACTATED RINGERS IV SOLN: INTRAVENOUS | Status: DC | PRN

## 2017-04-22 MED ORDER — SODIUM CHLORIDE 0.9 % IJ SOLN
INTRAMUSCULAR | Status: AC
  Filled 2017-04-22: qty 10

## 2017-04-22 MED ORDER — ONDANSETRON HCL 4 MG/2ML IJ SOLN
INTRAMUSCULAR | Status: DC | PRN
  Administered 2017-04-22: 4 mg via INTRAVENOUS

## 2017-04-22 MED ORDER — ONABOTULINUMTOXINA 100 UNITS IJ SOLR
INTRAMUSCULAR | Status: AC
  Filled 2017-04-22: qty 100

## 2017-04-22 NOTE — Evaluation (Signed)
Physical Therapy Evaluation Patient Details Name: OSWALD POTT MRN: [id number] DOB: Aug 25, 1940 Today's Date: 04/22/2017   History of Present Illness  [name] is a 77 y.o. 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  Recurrent admits for dehydration with generalized weakness.  Patient apparently has been eating more solid foods than he normally should due to the recent holidays.  Over the last couple of days however he has been unable to eat or drink anything though.  Is handling his own secretions but immediately vomits anything else he tries to take PO.  Now s/o EGD with dilitation.  Clinical Impression  Patient presents with decreased independence and safety with mobility due to limited safety awareness, decreased balance, decreased strength/activity tolerance and will benefit from skilled PT in the acute setting to allow return home with family support and follow up HHPT.     Follow Up Recommendations Home health PT;Supervision/Assistance - 24 hour    Equipment Recommendations  None recommended by PT    Recommendations for Other Services       Precautions / Restrictions Precautions Precautions: Fall      Mobility  Bed Mobility Overal bed mobility: Needs Assistance Bed Mobility: Supine to Sit     Supine to sit: Min assist        Transfers Overall transfer level: Needs assistance Equipment used: Rolling walker (2 wheeled) Transfers: Sit to/from Stand Sit to Stand: Min assist         General transfer comment: initially with difficulty due to leaning back, was better on subsequent attempts, reports has lift chair at home; sits with uncontrolled descent if not cued  Ambulation/Gait Ambulation/Gait assistance: Min guard;Supervision Ambulation Distance (Feet): 120 Feet Assistive device: Rolling walker (2 wheeled) Gait Pattern/deviations: Step-through  pattern;Decreased stride length;Shuffle     General Gait Details: wider than normal turns, questioning vision due to difficutly following path of hallway and has milky looking L eye, but not formally assessed.   Stairs            Wheelchair Mobility    Modified Rankin (Stroke Patients Only)       Balance Overall balance assessment: Needs assistance   Sitting balance-Leahy Scale: Good   Postural control: Posterior lean   Standing balance-Leahy Scale: Poor Standing balance comment: posterior lean especially going stand to sit and not able to find chair safely without A (again question if due to vision)                             Pertinent Vitals/Pain Pain Assessment: No/denies pain    Home Living Family/patient expects to be discharged to:: Private residence Living Arrangements: Spouse/significant other Available Help at Discharge: Family Type of Home: House Home Access: Stairs to enter Entrance Stairs-Rails: None Entrance Stairs-Number of Steps: 2 Home Layout: One level Home Equipment: Cane - single point;Walker - 2 wheels      Prior Function Level of Independence: Needs assistance   Gait / Transfers Assistance Needed: ambulates with cane or walker  ADL's / Homemaking Assistance Needed: pt reports independence with dressing        Hand Dominance        Extremity/Trunk Assessment   Upper Extremity Assessment Upper Extremity Assessment: Generalized weakness    Lower Extremity Assessment Lower Extremity Assessment: Generalized weakness       Communication   Communication: No difficulties  Cognition Arousal/Alertness: Awake/alert Behavior During Therapy: WFL for tasks assessed/performed Overall Cognitive Status: Impaired/Different from baseline Area of Impairment: Safety/judgement;Memory                     Memory: Decreased short-term memory   Safety/Judgement: Decreased awareness of safety     General Comments: wife  reports more recent cognitive decline, feels could be due to his dehydration; was recalling bed at home as higher, wife said it is not      General Comments General comments (skin integrity, edema, etc.): wife reports cognitive decline recently, states she is home 99% of the time, pt states he isn't allowed to use the cane if he is home alone    Exercises     Assessment/Plan    PT Assessment Patient needs continued PT services  PT Problem List Decreased strength;Decreased knowledge of use of DME;Decreased activity tolerance;Decreased safety awareness;Decreased balance;Decreased mobility;Impaired sensation;Decreased cognition       PT Treatment Interventions DME instruction;Balance training;Gait training;Functional mobility training;Patient/family education;Therapeutic activities;Therapeutic exercise;Stair training    PT Goals (Current goals can be found in the Care Plan section)  Acute Rehab PT Goals Patient Stated Goal: To get stronger and sleep less PT Goal Formulation: With patient/family Time For Goal Achievement: 04/29/17 Potential to Achieve Goals: Fair    Frequency Min 3X/week   Barriers to discharge        Co-evaluation               AM-PAC PT "6 Clicks" Daily Activity  Outcome Measure Difficulty turning over in bed (including adjusting bedclothes, sheets and blankets)?: A Little Difficulty moving from lying on back to sitting on the side of the bed? : Unable Difficulty sitting down on and standing up from a chair with arms (e.g., wheelchair, bedside commode, etc,.)?: Unable Help needed moving to and from a bed to chair (including a wheelchair)?: A Little Help needed walking in hospital room?: A Little Help needed climbing 3-5 steps with a railing? : A Lot 6 Click Score: 13    End of Session Equipment Utilized During Treatment: Gait belt Activity Tolerance: Patient limited by fatigue Patient left: in chair;with call bell/phone within reach;with  family/visitor present   PT Visit Diagnosis: Muscle weakness (generalized) (M62.81);Other abnormalities of gait and mobility (R26.89)    Time: 5993-5701 PT Time Calculation (min) (ACUTE ONLY): 26 min   Charges:   PT Evaluation $PT Eval Moderate Complexity: 1 Mod PT Treatments $Gait Training: 8-22 mins   PT G CodesMagda Kiel, [place] [phone] 04/22/2017   Reginia Naas 04/22/2017, 4:26 PM

## 2017-04-22 NOTE — Op Note (Addendum)
[facility] Patient Name: [name] Procedure Date: 04/22/2017 MRN: [id number] Attending MD: Mauri Pole , MD Date of Birth: [birth year] CSN: [id number] Age: 77 Admit Type: Inpatient Procedure:                Upper GI endoscopy Indications:              Dysphagia, Achalasia, For management of achalasia Providers:                Mauri Pole, MD, Kingsley Plan, RN, Cletis Athens, Technician Referring MD:              Medicines:                General Anesthesia Complications:            No immediate complications. Estimated Blood Loss:     Estimated blood loss was minimal. Procedure:                Pre-Anesthesia Assessment:                           - Prior to the procedure, a History and Physical                            was performed, and patient medications and                            allergies were reviewed. The patient's tolerance of                            previous anesthesia was also reviewed. The risks                            and benefits of the procedure and the sedation                            options and risks were discussed with the patient.                            All questions were answered, and informed consent                            was obtained. Prior Anticoagulants: The patient has                            taken no previous anticoagulant or antiplatelet                            agents. ASA Grade Assessment: III - A patient with                            severe systemic disease. After reviewing the risks  and benefits, the patient was deemed in                            satisfactory condition to undergo the procedure.                           After obtaining informed consent, the endoscope was                            passed under direct vision. Throughout the                            procedure, the patient's blood pressure, pulse, and                 oxygen saturations were monitored continuously. The                            [id number]) scope was introduced through the                            mouth, and advanced to the second part of duodenum.                            The upper GI endoscopy was somewhat difficult due                            to presence of food. The patient tolerated the                            procedure well. Scope In: Scope Out: Findings:      The lumen of the esophagus was severely dilated.      Food bezoar and fluid was found in the entire esophagus. Removal of food       was accomplished.      Esophagogastric landmarks were identified: the gastroesophageal junction       was found at 42 cm from the incisors.      The stomach was normal.      The examined duodenum was normal.      No gross lesions were noted in the entire esophagus. A guide wire was       placed, then the scope was withdrawn. Using the wire as a guide,       dilation with a 30 mm pneumatic balloon was performed under fluoroscopic       guidance. The balloon was inflated to 16 PSI with complete effacement of       the balloon waist. Pressure was held for 60 seconds before balloon       deflation and withdrawal. There was a small amount of blood on the       balloon. Impression:               - Dilation in the entire esophagus.                           - Food in the esophagus. Removal was successful.                           -  Esophagogastric landmarks identified.                           - Normal stomach.                           - Normal examined duodenum.                           - No gross lesions in esophagus. Dilated. Moderate Sedation:      N/A- Per Anesthesia Care Recommendation:           - NPO until gastrograffin esophagogram, then clears                            for today, advance to full liquid diet for 3 days                            followed by soft diet as tolerated.                            - Continue present medications.                           - No aspirin, ibuprofen, naproxen, or other                            non-steroidal anti-inflammatory drugs for 2 weeks.                           - Follow up gastrograffin esophagogram Procedure Code(s):        --- Professional ---                           [phone], Esophagogastroduodenoscopy, flexible,                            transoral; with dilation of esophagus with balloon                            (30 mm diameter or larger) (includes fluoroscopic                            guidance, when performed)                           [id number], Esophagogastroduodenoscopy, flexible,                            transoral; with removal of foreign body(s) Diagnosis Code(s):        --- Professional ---                           K22.8, Other specified diseases of esophagus                           T18.128A, Food in esophagus causing other injury,  initial encounter                           R13.10, Dysphagia, unspecified                           K22.0, Achalasia of cardia CPT copyright 2016 [employer]. All rights reserved. The codes documented in this report are preliminary and upon coder review may  be revised to meet current compliance requirements. Mauri Pole, MD 04/22/2017 10:50:36 AM This report has been signed electronically. Number of Addenda: 0

## 2017-04-22 NOTE — Anesthesia Procedure Notes (Signed)
Date/Time: 04/22/2017 10:44 AM Performed by: Cynda Familia, CRNA Oxygen Delivery Method: Simple face mask Placement Confirmation: positive ETCO2 and breath sounds checked- equal and bilateral Dental Injury: Teeth and Oropharynx as per pre-operative assessment

## 2017-04-22 NOTE — Progress Notes (Signed)
PROGRESS NOTE  [name] VVO:[id number] DOB: October 19, 1940 DOA: 04/18/2017 PCP: Lance Sell, NP   LOS: 4 days   Brief Narrative / Interim history: 77 y.o. 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  Recurrent admits for dehydration with generalized weakness.  Patient apparently has been eating more solid foods than he normally should due to the recent holidays.  Over the last couple of days however he has been unable to eat or drink anything though.  Gastroenterology was consulted and patient was admitted to the hospital.  Assessment & Plan: Principal Problem:   Achalasia, esophageal Active Problems:   Type II diabetes mellitus with renal manifestations ([place])   Essential hypertension   Acute renal failure superimposed on stage 3 chronic kidney disease (HCC)   Dehydration   Hypernatremia   Failure to thrive in adult   Esophageal dysphagia   Achalasia/history of strictures -Gastroenterology consulted, EGD today 1/14 with esophageal bezoar, removed, also s/p dilatation gastrografin study pending, keep NPO  Acute kidney injury on chronic kidney disease stage III -Due to dehydration, continue IV fluids, BMP this morning stable -baseline creatinine around 2-2.5 -Creatinine overall stable  Anemia of chronic disease -no bleeding, Hb stable  Hypernatremia -Due to poor p.o. Intake -improved  Hypertension -Resume home metoprolol, likely needs to be crushed, hydralazine as needed for systolic greater than 269  Type 2 diabetes mellitus -Continue sliding scale  Prostate cancer -hold Xtandi   DVT prophylaxis: SCDs Code Status: Full code Family Communication: wife bedside Disposition Plan: Home when ready  Consultants:   Gastroenterology  Procedures:   None  Antimicrobials:  None    Subjective: -seen post EGD, drowsy post anesthesia but without complaints  Objective: Vitals:   04/22/17 0850 04/22/17 1047  04/22/17 1100 04/22/17 1115  BP: 136/82 140/74 139/85 (!) 122/106  Pulse: 80  69 70  Resp: (!) 21 16 12 17   Temp: 98.2 F (36.8 C) 98.2 F (36.8 C)  97.7 F (36.5 C)  TempSrc: Oral Oral  Oral  SpO2: 95% 100% 100% 100%  Weight:      Height:        Intake/Output Summary (Last 24 hours) at 04/22/2017 1227 Last data filed at 04/22/2017 1045 Gross per 24 hour  Intake 2427.5 ml  Output 1150 ml  Net 1277.5 ml   Filed Weights   04/18/17 1746  Weight: 54 kg (119 lb)    Examination:  Constitutional: NAD Eyes: no icterus    Data Reviewed: I have independently reviewed following labs and imaging studies   CBC: Recent Labs  Lab 04/18/17 2049 04/19/17 0540 04/20/17 0553 04/21/17 0650 04/22/17 0606  WBC 7.9 8.1 4.5 5.0 10.8*  HGB 9.3* 8.6* 7.5* 7.1* 8.5*  HCT 28.2* 26.7* 22.3* 21.2* 25.1*  MCV 98.9 97.4 97.4 97.2 96.2  PLT 265 237 200 176 485   Basic Metabolic Panel: Recent Labs  Lab 04/18/17 1832 04/19/17 0540 04/20/17 0553 04/21/17 0650 04/22/17 0606  NA 152* 152* 147* 140 139  K 3.6 3.4* 3.0* 4.2 4.6  CL 112* 114* 111 109 106  CO2 21* 23 26 25 24   GLUCOSE 238* 154* 87 89 130*  BUN 67* 58* 45* 32* 31*  CREATININE 2.58* 2.09* 1.89* 1.67* 1.80*  CALCIUM 8.7* 8.5* 8.5* 8.2* 8.5*   GFR: Estimated Creatinine Clearance: 26.7 mL/min (A) (by C-G formula based on SCr of 1.8 mg/dL (H)). Liver Function Tests: Recent Labs  Lab 04/18/17 2107  AST 38  ALT  8*  ALKPHOS 77  BILITOT 1.5*  PROT 4.6*  ALBUMIN 2.0*   No results for input(s): LIPASE, AMYLASE in the last 168 hours. No results for input(s): AMMONIA in the last 168 hours. Coagulation Profile: Recent Labs  Lab 04/22/17 0619  INR 0.97   Cardiac Enzymes: No results for input(s): CKTOTAL, CKMB, CKMBINDEX, TROPONINI in the last 168 hours. BNP (last 3 results) No results for input(s): PROBNP in the last 8760 hours. HbA1C: No results for input(s): HGBA1C in the last 72 hours. CBG: Recent Labs  Lab  04/21/17 1707 04/21/17 1944 04/21/17 2340 04/22/17 0415 04/22/17 0801  GLUCAP 77 122* 150* 180* 104*   Lipid Profile: No results for input(s): CHOL, HDL, LDLCALC, TRIG, CHOLHDL, LDLDIRECT in the last 72 hours. Thyroid Function Tests: No results for input(s): TSH, T4TOTAL, FREET4, T3FREE, THYROIDAB in the last 72 hours. Anemia Panel: No results for input(s): VITAMINB12, FOLATE, FERRITIN, TIBC, IRON, RETICCTPCT in the last 72 hours. Urine analysis:    Component Value Date/Time   COLORURINE YELLOW 04/18/2017 2049   APPEARANCEUR CLEAR 04/18/2017 2049   LABSPEC 1.014 04/18/2017 2049   PHURINE 5.0 04/18/2017 2049   GLUCOSEU NEGATIVE 04/18/2017 2049   GLUCOSEU NEGATIVE 09/22/2014 0757   HGBUR NEGATIVE 04/18/2017 2049   BILIRUBINUR NEGATIVE 04/18/2017 2049   BILIRUBINUR n 04/30/2011 1534   KETONESUR 20 (A) 04/18/2017 2049   PROTEINUR NEGATIVE 04/18/2017 2049   UROBILINOGEN 0.2 09/22/2014 0757   NITRITE NEGATIVE 04/18/2017 2049   LEUKOCYTESUR NEGATIVE 04/18/2017 2049   Sepsis Labs: Invalid input(s): PROCALCITONIN, LACTICIDVEN  No results found for this or any previous visit (from the past 240 hour(s)).    Radiology Studies: Dg Esophagus Dilation  Result Date: 04/22/2017 ESOPHAGEAL DILATATION: Fluoroscopy was provided for use by the requesting physician.  No images were obtained for radiographic interpretation.   Scheduled Meds: . feeding supplement (ENSURE ENLIVE)  237 mL Oral TID BM  . insulin aspart  0-9 Units Subcutaneous Q4H  . metoprolol tartrate  12.5 mg Oral BID   Continuous Infusions: . dextrose 5% lactated ringers 50 mL/hr at 04/21/17 [place], MD, PhD [facility] Pager [phone]  If 7PM-7AM, please contact night-coverage [email or web address] Password [facility] 04/22/2017, 12:27 PM

## 2017-04-22 NOTE — Anesthesia Preprocedure Evaluation (Addendum)
Anesthesia Evaluation  Patient identified by MRN, date of birth, ID band Patient awake    Reviewed: Allergy & Precautions, NPO status , Patient's Chart, lab work & pertinent test results  History of Anesthesia Complications (+) Emergence Delirium  Airway Mallampati: II  TM Distance: >3 FB     Dental  (+) Edentulous Upper, Poor Dentition, Missing, Dental Advisory Given   Pulmonary pneumonia, former smoker,    breath sounds clear to auscultation       Cardiovascular hypertension, +CHF   Rhythm:Regular Rate:Normal     Neuro/Psych    GI/Hepatic GERD  ,  Endo/Other  diabetes  Renal/GU Renal disease     Musculoskeletal   Abdominal   Peds  Hematology   Anesthesia Other Findings   Reproductive/Obstetrics                           Anesthesia Physical Anesthesia Plan  ASA: III  Anesthesia Plan: General   Post-op Pain Management:    Induction: Intravenous  PONV Risk Score and Plan: 2 and Treatment may vary due to age or medical condition  Airway Management Planned:   Additional Equipment:   Intra-op Plan:   Post-operative Plan: Extubation in OR  Informed Consent: I have reviewed the patients History and Physical, chart, labs and discussed the procedure including the risks, benefits and alternatives for the proposed anesthesia with the patient or authorized representative who has indicated his/her understanding and acceptance.   Dental advisory given  Plan Discussed with: CRNA and Anesthesiologist  Anesthesia Plan Comments:         Anesthesia Quick Evaluation

## 2017-04-22 NOTE — Anesthesia Procedure Notes (Signed)
Procedure Name: Intubation Date/Time: 04/22/2017 9:49 AM Performed by: Cynda Familia, CRNA Pre-anesthesia Checklist: Patient identified, Emergency Drugs available, Suction available and Patient being monitored Patient Re-evaluated:Patient Re-evaluated prior to induction Oxygen Delivery Method: Circle System Utilized Preoxygenation: Pre-oxygenation with 100% oxygen Induction Type: IV induction Ventilation: Mask ventilation without difficulty Laryngoscope Size: Miller and 2 Grade View: Grade I Tube type: Oral Number of attempts: 1 Airway Equipment and Method: Stylet Placement Confirmation: ETT inserted through vocal cords under direct vision,  positive ETCO2 and breath sounds checked- equal and bilateral Secured at: 22 cm Tube secured with: Tape Dental Injury: Teeth and Oropharynx as per pre-operative assessment  Comments: Smooth IV induction Edwards-- intubation AM CRNA atraumatic-- bottom teeth ( only a few) as preop  Bilat BS [place]

## 2017-04-22 NOTE — Transfer of Care (Signed)
Immediate Anesthesia Transfer of Care Note  Patient: [name]  Procedure(s) Performed: ESOPHAGOGASTRODUODENOSCOPY (EGD) (N/A ) PNEUMATIC BALLOON DILATION WITH FLORO (N/A )  Patient Location: PACU  Anesthesia Type:General  Level of Consciousness: sedated  Airway & Oxygen Therapy: Patient Spontanous Breathing and Patient connected to face mask oxygen  Post-op Assessment: Report given to RN and Post -op Vital signs reviewed and stable  Post vital signs: Reviewed and stable  Last Vitals:  Vitals:   04/22/17 0416 04/22/17 0850  BP: 123/65 136/82  Pulse: 80 80  Resp: 18 (!) 21  Temp: 36.7 C 36.8 C  SpO2: 100% 95%    Last Pain:  Vitals:   04/22/17 0850  TempSrc: Oral  PainSc:          Complications: No apparent anesthesia complications

## 2017-04-22 NOTE — H&P (Signed)
[facility] History and Physical   Primary Care Physician:  Lance Sell, NP   Reason for Procedure:   Dysphagia, achalasia  Plan:    EGD with possible pneumatic balloon dilation/botox injection     HPI: [name] is a 77 y.o. male with achalasia, chronic dysphagia admitted with severe dehydration and acute or chronic kidney disease.    Past Medical History:  Diagnosis Date  . Anemia    IRON TRANSFUSION 1 MONTH AGO  . Blood clot in vein 2008   LEFT LEG  . Cataract    LEFT  . CKD (chronic kidney disease) 02/2016  . Diabetes mellitus, type 2 ([place]) 2008  . GERD (gastroesophageal reflux disease)   . Hyperlipidemia 2008  . Hypertension 2008  . Pneumonia 01/18/2016  . Prostate cancer ([place]) 2006   IMPLANT IN LEFT ARM FOR [place]    Past Surgical History:  Procedure Laterality Date  . BALLOON DILATION N/A 04/13/2016   Procedure: BALLOON DILATION;  Surgeon: Gatha Mayer, MD;  Location[facility] ENDOSCOPY;  Service: Endoscopy;  Laterality: N/A;  . BOTOX INJECTION N/A 04/13/2016   Procedure: BOTOX INJECTION;  Surgeon: Gatha Mayer, MD;  Location: [place];  Service: Endoscopy;  Laterality: N/A;  . BOTOX INJECTION N/A 01/02/2017   Procedure: BOTOX INJECTION;  Surgeon: Yetta Flock, MD;  Location: [facility] ENDOSCOPY;  Service: Gastroenterology;  Laterality: N/A;  . COLONOSCOPY W/ POLYPECTOMY  03/2008   diminutive rectal polyp (path: prolapse type polyp, not adenoma).  moderate pan-diverticulitis.   Mar[facility] Kitchen ESOPHAGEAL MANOMETRY  1984   unable to pass manometry catheter beyond LES. Aperiystalsis of esophagus: sugg of achalasia.   Marland Kitchen ESOPHAGOGASTRODUODENOSCOPY N/A 02/17/2016   Procedure: ESOPHAGOGASTRODUODENOSCOPY (EGD);  Surgeon: [name], MD;  Location: [facility] ENDOSCOPY;  Service: Endoscopy;  Laterality: N/A;  . ESOPHAGOGASTRODUODENOSCOPY (EGD) WITH PROPOFOL N/A 02/21/2016   Procedure: ESOPHAGOGASTRODUODENOSCOPY (EGD) WITH PROPOFOL;  Surgeon: Jerene Bears, MD;   Location: [facility] ENDOSCOPY;  Service: Gastroenterology;  Laterality: N/A;  . ESOPHAGOGASTRODUODENOSCOPY (EGD) WITH PROPOFOL N/A 02/28/2016   Procedure: ESOPHAGOGASTRODUODENOSCOPY (EGD) WITH PROPOFOL withBOTOX;  Surgeon: Manus Gunning, MD;  Location: [facility] ENDOSCOPY;  Service: Gastroenterology;  Laterality: N/A;  . ESOPHAGOGASTRODUODENOSCOPY (EGD) WITH PROPOFOL N/A 04/13/2016   Procedure: ESOPHAGOGASTRODUODENOSCOPY (EGD) WITH PROPOFOL;  Surgeon: Gatha Mayer, MD;  Location: [facility];  Service: Endoscopy;  Laterality: N/A;  . ESOPHAGOGASTRODUODENOSCOPY (EGD) WITH PROPOFOL N/A 01/02/2017   Procedure: ESOPHAGOGASTRODUODENOSCOPY (EGD) WITH PROPOFOL;  Surgeon: Yetta Flock, MD;  Location: [facility] ENDOSCOPY;  Service: Gastroenterology;  Laterality: N/A;  . LEFT ARM IMPLANT X2     RADIATION TX ALSO    Prior to Admission medications   Medication Sig Start Date End Date Taking? Authorizing Provider  ACCU-CHEK FASTCLIX LANCETS MISC Use to check blood sugars twice daily E11.65 11/08/16  Yes Elayne Snare, MD  ACCU-CHEK FASTCLIX LANCETS MISC USE TO CHECK BLOOD SUGARS TWICE DAILY E11.65 02/11/17  Yes Elayne Snare, MD  Amino Acids-Protein Hydrolys (FEEDING SUPPLEMENT, PRO-STAT SUGAR FREE 64,) LIQD Take 30 mLs by mouth 2 (two) times daily. Patient taking differently: Take 30 mLs by mouth daily.  04/13/16  Yes Dessa Phi, DO  atorvastatin (LIPITOR) 10 MG tablet Take 1 tablet (10 mg total) by mouth at bedtime. 07/09/16  Yes Elayne Snare, MD  docusate sodium (COLACE) 100 MG capsule TAKE ONE CAPSULE BY MOUTH TWICE A DAY FOR CONSTIPATION 07/04/16  Yes Elayne Snare, MD  enzalutamide Gillermina Phy) 40 MG capsule Take 160 mg by mouth daily.  Yes [provider]  feeding supplement, ENSURE ENLIVE, (ENSURE ENLIVE) LIQD Take 237 mLs by mouth 2 (two) times daily between meals. 01/26/17  Yes Florencia Reasons, MD  ferrous sulfate 325 (65 FE) MG tablet TAKE 1 TABLET BY MOUTH EVERY DAY FOR SUPPLEMENT 02/20/17  Yes Elayne Snare, MD   furosemide (LASIX) 80 MG tablet Take 80 mg by mouth daily. 03/26/17  Yes [provider]  glipiZIDE (GLUCOTROL XL) 5 MG 24 hr tablet Take 5 mg by mouth daily with breakfast.   Yes [provider]  glucose blood test strip Use as instructed 11/08/16  Yes Elayne Snare, MD  metoprolol tartrate (LOPRESSOR) 25 MG tablet Take 0.5 tablets (12.5 mg total) by mouth 2 (two) times daily. 04/11/17  Yes Lance Sell, NP  Multiple Vitamin (MULTIVITAMIN WITH MINERALS) TABS tablet Take 1 tablet by mouth daily.   Yes [provider]  ondansetron (ZOFRAN-ODT) 4 MG disintegrating tablet Take 1 tablet (4 mg total) by mouth every 8 (eight) hours as needed for nausea or vomiting. 01/18/17  Yes Golden Circle, FNP  pantoprazole (PROTONIX) 40 MG tablet TAKE 1 TABLET TWICE A DAY 02/14/17  Yes Elayne Snare, MD  pioglitazone (ACTOS) 15 MG tablet Take 1 tablet (15 mg total) daily by mouth. 02/12/17  Yes Elayne Snare, MD  TRADJENTA 5 MG TABS tablet TAKE 1 TABLET (5 MG TOTAL) BY MOUTH DAILY. 04/15/17  Yes Elayne Snare, MD    Current Facility-Administered Medications  Medication Dose Route Frequency Provider Last Rate Last Dose  . 0.9 %  sodium chloride infusion   Intravenous Continuous Mauri Pole, MD 20 mL/hr at 04/22/17 0852    . [MAR Hold] acetaminophen (TYLENOL) tablet 650 mg  650 mg Oral Q6H PRN Etta Quill, DO       Or  . Doug Sou Hold] acetaminophen (TYLENOL) suppository 650 mg  650 mg Rectal Q6H PRN Etta Quill, DO      . dextrose 5 % in lactated ringers infusion   Intravenous Continuous Caren Griffins, MD 50 mL/hr at 04/21/17 1506    . [MAR Hold] feeding supplement (ENSURE ENLIVE) (ENSURE ENLIVE) liquid 237 mL  237 mL Oral TID BM Caren Griffins, MD   237 mL at 04/21/17 1943  . [MAR Hold] hydrALAZINE (APRESOLINE) injection 10 mg  10 mg Intravenous Q4H PRN Etta Quill, DO      . [MAR Hold] insulin aspart (novoLOG) injection 0-9 Units  0-9 Units Subcutaneous Q4H  Etta Quill, DO   2 Units at 04/22/17 0423  . [MAR Hold] LORazepam (ATIVAN) injection 0.5 mg  0.5 mg Intravenous Q6H PRN Etta Quill, DO   0.5 mg at 04/19/17 0213  . [MAR Hold] metoprolol tartrate (LOPRESSOR) tablet 12.5 mg  12.5 mg Oral BID Caren Griffins, MD   12.5 mg at 04/21/17 0954    Allergies as of 04/18/2017  . (No Known Allergies)    Family History  Problem Relation Age of Onset  . Diabetes Father   . Diabetes Sister   . Hypertension Sister   . Hypertension Brother   . Heart disease Neg Hx   . Colon cancer Neg Hx   . Stomach cancer Neg Hx     Social History   Socioeconomic History  . Marital status: Married    Spouse name: Not on file  . Number of children: 2  . Years of education: 34  . Highest education level: Not on file  Social Needs  .  Financial resource strain: Not on file  . Food insecurity - worry: Not on file  . Food insecurity - inability: Not on file  . Transportation needs - medical: Not on file  . Transportation needs - non-medical: Not on file  Occupational History  . Occupation: retired  Tobacco Use  . Smoking status: Former Smoker    Years: 10.00    Last attempt to quit: 2000    Years since quitting: 19.0  . Smokeless tobacco: Never Used  Substance and Sexual Activity  . Alcohol use: No  . Drug use: No  . Sexual activity: No  Other Topics Concern  . Not on file  Social History Narrative   Fun/Hobby: Football    Review of Systems:  All other review of systems negative except as mentioned in the HPI.  Physical Exam: Vital signs in last 24 hours: Temp:  [98 F (36.7 C)-98.5 F (36.9 C)] 98.2 F (36.8 C) (01/14 0850) Pulse Rate:  [75-84] 80 (01/14 0850) Resp:  [16-21] 21 (01/14 0850) BP: (102-136)/(62-82) 136/82 (01/14 0850) SpO2:  [93 %-100 %] 95 % (01/14 0850) Last BM Date: 04/21/17 General:   Alert,  Cachetic appearing and cooperative in NAD Lungs:  Clear throughout to auscultation.   Heart:  Regular rate and  rhythm; no murmurs, clicks, rubs,  or gallops. Abdomen:  Soft, nontender and nondistended. Normal bowel sounds.   Neuro/Psych:  Alert and cooperative. Normal mood and affect. A and O x 3   @K .Denzil Magnuson, MD [phone] Mon-Fri 8a-5p [phone] after 5p, weekends, holidays 04/22/2017 9:27 AM@

## 2017-04-22 NOTE — Anesthesia Postprocedure Evaluation (Signed)
Anesthesia Post Note  Patient: [name]  Procedure(s) Performed: ESOPHAGOGASTRODUODENOSCOPY (EGD) (N/A ) PNEUMATIC BALLOON DILATION WITH FLORO (N/A )     Patient location during evaluation: Endoscopy Anesthesia Type: General Level of consciousness: awake Pain management: pain level controlled Vital Signs Assessment: post-procedure vital signs reviewed and stable Respiratory status: spontaneous breathing Cardiovascular status: stable Anesthetic complications: no    Last Vitals:  Vitals:   04/22/17 1100 04/22/17 1115  BP: 139/85 (!) 122/106  Pulse: 69 70  Resp: 12 17  Temp:  36.5 C  SpO2: 100% 100%    Last Pain:  Vitals:   04/22/17 1115  TempSrc: Oral  PainSc:                  Melodye Swor

## 2017-04-23 ENCOUNTER — Encounter (HOSPITAL_COMMUNITY): Payer: Self-pay | Admitting: Gastroenterology

## 2017-04-23 ENCOUNTER — Ambulatory Visit (HOSPITAL_COMMUNITY): Payer: Medicare Other

## 2017-04-23 LAB — GLUCOSE, CAPILLARY
GLUCOSE-CAPILLARY: 51 mg/dL — AB (ref 65–99)
GLUCOSE-CAPILLARY: 61 mg/dL — AB (ref 65–99)
GLUCOSE-CAPILLARY: 87 mg/dL (ref 65–99)
Glucose-Capillary: 87 mg/dL (ref 65–99)
Glucose-Capillary: 88 mg/dL (ref 65–99)

## 2017-04-23 MED ORDER — FUROSEMIDE 80 MG PO TABS: 40.0000 mg | ORAL_TABLET | Freq: Every day | ORAL | 0 refills | Status: AC

## 2017-04-23 NOTE — Progress Notes (Addendum)
[name] Progress Note  CC:  Nausea and vomiting, achalasia  Subjective:  Feels fine.  No vomiting on clears.  EGD with dilation on 1/14 as follows:  Findings: Food bezoar and fluid was found in the entire esophagus. Removal of food was accomplished. Esophagogastric landmarks were identified: the gastroesophageal junction was found at 42 cm from the incisors. The stomach was normal. The examined duodenum was normal. No gross lesions were noted in the entire esophagus. A guide wire was placed, then the scope was withdrawn. Using the wire as a guide, dilation with a 30 mm pneumatic balloon was performed under fluoroscopic guidance. The balloon was inflated to 16 PSI with complete effacement of the balloon waist. Pressure was held for 60 seconds before balloon deflation and withdrawal. There was a small amount of blood on the balloon.  Objective:  Vital signs in last 24 hours: Temp:  [97.5 F (36.4 C)-98.2 F (36.8 C)] 97.5 F (36.4 C) (01/15 0439) Pulse Rate:  [64-74] 64 (01/15 0439) Resp:  [12-20] 20 (01/15 0439) BP: (102-140)/(61-106) 112/61 (01/15 0439) SpO2:  [99 %-100 %] 100 % (01/15 0439) Last BM Date: 04/21/17 General:  Alert, Well-developed, in NAD Heart:  Regular rate and rhythm; no murmurs Pulm:  CTAB.  No increased WOB. Abdomen:  Soft, non-distended.  BS present.  Non-tender. Extremities:  Without edema. Neurologic:  Alert and oriented x 4;  grossly normal neurologically. Psych:  Alert and cooperative. Normal mood and affect.  Intake/Output from previous day: 01/14 0701 - 01/15 0700 In: 850 [P.O.:200; I.V.:400; IV Piggyback:250] Out: 400 [Urine:400]  Lab Results: Recent Labs    04/21/17 0650 04/22/17 0606  WBC 5.0 10.8*  HGB 7.1* 8.5*  HCT 21.2* 25.1*  PLT 176 197   BMET Recent Labs    04/21/17 0650 04/22/17 0606  NA 140 139  K 4.2 4.6  CL 109 106  CO2 25 24  GLUCOSE 89 130*  BUN 32* 31*  CREATININE 1.67* 1.80*  CALCIUM  8.2* 8.5*   PT/INR Recent Labs    04/22/17 0619  LABPROT 12.8  INR 0.97   Dg Esophagus Dilation  Result Date: 04/22/2017 ESOPHAGEAL DILATATION: Fluoroscopy was provided for use by the requesting physician.  No images were obtained for radiographic interpretation.  Dg Esophagus W/water Sol Cm  Result Date: 04/22/2017 CLINICAL DATA:  Achalasia, status post balloon dilatation of the esophagus, evaluate for leak EXAM: ESOPHOGRAM/BARIUM SWALLOW TECHNIQUE: Single contrast examination was performed using  water soluble. FLUOROSCOPY TIME:  Fluoroscopy Time:  1.6 minutes Radiation Exposure Index (if provided by the fluoroscopic device): 14.8 mGy Number of Acquired Spot Images: 7 COMPARISON:  Esophagram dated 01/24/2017 FINDINGS: Dilated mid/distal esophagus, compatible with the history achalasia. Minimal contrast passed into the stomach after 5 minutes, suggesting a residual lower esophageal narrowing/stricture. No evidence of leak. IMPRESSION: No evidence of leak. Achalasia with suspected residual lower esophageal narrowing/stricture. Electronically Signed   By: Julian Hy M.D.   On: 04/22/2017 15:20   Assessment / Plan: 1.  Dysphagia: History of achalasia and esophageal stricture, s/p EGD with pneumatic balloon dilation by Dr. Silverio Decamp on 1/14.  I have advanced to full liquids, which he should continue until 1/18 at which time he can advance to soft diet.   LOS: 5 days   Laban Emperor. Zehr  04/23/2017, 9:28 AM  Pager number [phone]   Attending physician's note   I have taken an interval history, reviewed the chart and examined the patient. I agree with the  Advanced Practitioner's note, impression and recommendations.  S/p pneumatic dilation yesterday, he has severe esophageal dilation.  Gastrografin contrast was flowing through the EG junction but given the chronicity of his symptoms and esophageal dilation, he will continue to have esophageal stasis.  Continue full liquid diet for 3  days, can advance to soft diet thereafter Ensure 3 times daily to improve nutritional status We will arrange for follow-up visit in 4-6 weeks in GI office If continues to have progressive weight loss and inability to meet nutritional requirement, will have to consider G-tube to support his nutrition.  Patient is likely not a candidate for esophagectomy given his age and co-morbidities. Okay to discharge home today  Damaris Hippo, MD [phone] Mon-Fri 8a-5p [phone] after 5p, weekends, holidays

## 2017-04-23 NOTE — Care Management Important Message (Signed)
Important Message  Patient Details  Name: YAZID POP MRN: [id number] Date of Birth: [birth year]   Medicare Important Message Given:  Yes    Kerin Salen 04/23/2017, 11:41 AMImportant Message  Patient Details  Name: ARBOR COHEN MRN: [id number] Date of Birth: 1940-06-09   Medicare Important Message Given:  Yes    Kerin Salen 04/23/2017, 11:41 AM

## 2017-04-23 NOTE — Discharge Instructions (Signed)
Full liquid diet until 1/18 and then can advance to soft diet.   Follow with Lance Sell, NP in 1-2 weeks  Please get a complete blood count and chemistry panel checked by your Primary MD at your next visit, and again as instructed by your Primary MD. Please get your medications reviewed and adjusted by your Primary MD.  Please request your Primary MD to go over all Hospital Tests and Procedure/Radiological results at the follow up, please get all Hospital records sent to your Prim MD by signing hospital release before you go home.  If you had Pneumonia of Lung problems at the Hospital: Please get a 2 view Chest X ray done in 6-8 weeks after hospital discharge or sooner if instructed by your Primary MD.  If you have Congestive Heart Failure: Please call your Cardiologist or Primary MD anytime you have any of the following symptoms:  1) 3 pound weight gain in 24 hours or 5 pounds in 1 week  2) shortness of breath, with or without a dry hacking cough  3) swelling in the hands, feet or stomach  4) if you have to sleep on extra pillows at night in order to breathe  Follow cardiac low salt diet and 1.5 lit/day fluid restriction.  If you have diabetes Accuchecks 4 times/day, Once in AM empty stomach and then before each meal. Log in all results and show them to your primary doctor at your next visit. If any glucose reading is under 80 or above 300 call your primary MD immediately.  If you have Seizure/Convulsions/Epilepsy: Please do not drive, operate heavy machinery, participate in activities at heights or participate in high speed sports until you have seen by Primary MD or a Neurologist and advised to do so again.  If you had Gastrointestinal Bleeding: Please ask your Primary MD to check a complete blood count within one week of discharge or at your next visit. Your endoscopic/colonoscopic biopsies that are pending at the time of discharge, will also need to followed by your  Primary MD.  Get Medicines reviewed and adjusted. Please take all your medications with you for your next visit with your Primary MD  Please request your Primary MD to go over all hospital tests and procedure/radiological results at the follow up, please ask your Primary MD to get all Hospital records sent to his/her office.  If you experience worsening of your admission symptoms, develop shortness of breath, life threatening emergency, suicidal or homicidal thoughts you must seek medical attention immediately by calling 911 or calling your MD immediately  if symptoms less severe.  You must read complete instructions/literature along with all the possible adverse reactions/side effects for all the Medicines you take and that have been prescribed to you. Take any new Medicines after you have completely understood and accpet all the possible adverse reactions/side effects.   Do not drive or operate heavy machinery when taking Pain medications.   Do not take more than prescribed Pain, Sleep and Anxiety Medications  Special Instructions: If you have smoked or chewed Tobacco  in the last 2 yrs please stop smoking, stop any regular Alcohol  and or any Recreational drug use.  Wear Seat belts while driving.  Please note You were cared for by a hospitalist during your hospital stay. If you have any questions about your discharge medications or the care you received while you were in the hospital after you are discharged, you can call the unit and asked to speak with the  hospitalist on call if the hospitalist that took care of you is not available. Once you are discharged, your primary care physician will handle any further medical issues. Please note that NO REFILLS for any discharge medications will be authorized once you are discharged, as it is imperative that you return to your primary care physician (or establish a relationship with a primary care physician if you do not have one) for your aftercare  needs so that they can reassess your need for medications and monitor your lab values.  You can reach the hospitalist office at phone [phone] or fax [phone]   If you do not have a primary care physician, you can call [phone] for a physician referral.  Activity: As tolerated with Full fall precautions use walker/cane & assistance as needed  Diet: full liquids 1/15, soft diet 1/16  Disposition Home

## 2017-04-23 NOTE — Progress Notes (Signed)
CRITICAL VALUE ALERT  Critical Value:cbg 51 Date & Time Notied:  0400  Provider Notified: no  Orders Received/Actions taken: cbg 87

## 2017-04-23 NOTE — Progress Notes (Signed)
[id number]/JWTGRM Davis,BSN,RN3,CCM/[phone]/TCT-L[facility]/pt services at home/message left and request for call back for verification.

## 2017-04-23 NOTE — Progress Notes (Signed)
PT Cancellation Note  Patient Details Name: [name] MRN: [id number] DOB: 10-30-1940   Cancelled Treatment:    Reason Eval/Treat Not Completed: Other (comment).  Pt was in the middle of ADL's and was not available for therapy and then his meal arrived.   Ramond Dial 04/23/2017, 12:28 PM   Mee Hives, PT MS Acute Rehab Dept. Number: [place] and [place]

## 2017-04-23 NOTE — Discharge Summary (Signed)
Physician Discharge Summary  [name] CXK:[id number] DOB: Aug 15, 1940 DOA: 04/18/2017  PCP: Lance Sell, NP  Admit date: 04/18/2017 Discharge date: 04/23/2017  Admitted From: home Disposition:  home  Recommendations for Outpatient Follow-up:  1. Follow up with PCP in 1-2 weeks  Home Health: PT Equipment/Devices: none   Discharge Condition: stable CODE STATUS: Full code Diet recommendation: soft  HPI: Per Dr. Per Dr. Alcario Drought, [name] is a 77 y.o. 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  Recurrent admits for dehydration with generalized weakness.  Patient apparently has been eating more solid foods than he normally should due to the recent holidays.  Over the last couple of days however he has been unable to eat or drink anything though.  Is handling his own secretions but immediately vomits anything else he tries to take PO.  Vomit is NBNB (undigested food). He endorses generalized weakness, feeling of severe dehydration.  Lightheaded on standing. Denies pain "I never had pain with any of this" he states. ED Course: lab work c/w severe dehydration: sodium 152, cl 112, creat 2.5 up from ~1.8 baseline.  BUN 67 up from 36 in Dec.  Given 2L NS bolus.  GI called.   Hospital Course: Achalasia/history of strictures -Gastroenterology consulted and have followed patient while hospitalized.  She underwent EGD 1/14 which was remarkable for an esophageal bezoar, removed during procedure, and s/p dilatation.  Follow-up esophagogram without complicating features / leaks.  Acute kidney injury on chronic kidney disease stage III -Due to dehydration, baseline creatinine around 2-2.5, stable Anemia of chronic disease -no bleeding, Hb stable Hypernatremia -Due to poor p.o. Intake, improved Hypertension -resume home medications Type 2 diabetes mellitus -continue home medications Prostate cancer -outpatient management   Discharge  Diagnoses:  Principal Problem:   Achalasia, esophageal Active Problems:   Type II diabetes mellitus with renal manifestations (HCC)   Essential hypertension   Acute renal failure superimposed on stage 3 chronic kidney disease (HCC)   Dehydration   Hypernatremia   Failure to thrive in adult   Esophageal dysphagia  Discharge Instructions   Allergies as of 04/23/2017   No Known Allergies     Medication List    TAKE these medications   ACCU-CHEK FASTCLIX LANCETS Misc Use to check blood sugars twice daily E11.65   ACCU-CHEK FASTCLIX LANCETS Misc USE TO CHECK BLOOD SUGARS TWICE DAILY E11.65   atorvastatin 10 MG tablet Commonly known as:  LIPITOR Take 1 tablet (10 mg total) by mouth at bedtime.   docusate sodium 100 MG capsule Commonly known as:  COLACE TAKE ONE CAPSULE BY MOUTH TWICE A DAY FOR CONSTIPATION   feeding supplement (ENSURE ENLIVE) Liqd Take 237 mLs by mouth 2 (two) times daily between meals.   feeding supplement (PRO-STAT SUGAR FREE 64) Liqd Take 30 mLs by mouth 2 (two) times daily. What changed:  when to take this   ferrous sulfate 325 (65 FE) MG tablet TAKE 1 TABLET BY MOUTH EVERY DAY FOR SUPPLEMENT   furosemide 80 MG tablet Commonly known as:  LASIX Take 0.5 tablets (40 mg total) by mouth daily. What changed:  how much to take   glipiZIDE 5 MG 24 hr tablet Commonly known as:  GLUCOTROL XL Take 5 mg by mouth daily with breakfast.   glucose blood test strip Use as instructed   metoprolol tartrate 25 MG tablet Commonly known as:  LOPRESSOR Take 0.5 tablets (12.5 mg total) by mouth 2 (two)  times daily.   multivitamin with minerals Tabs tablet Take 1 tablet by mouth daily.   ondansetron 4 MG disintegrating tablet Commonly known as:  ZOFRAN-ODT Take 1 tablet (4 mg total) by mouth every 8 (eight) hours as needed for nausea or vomiting.   pantoprazole 40 MG tablet Commonly known as:  PROTONIX TAKE 1 TABLET TWICE A DAY   pioglitazone 15 MG  tablet Commonly known as:  ACTOS Take 1 tablet (15 mg total) daily by mouth.   TRADJENTA 5 MG Tabs tablet Generic drug:  linagliptin TAKE 1 TABLET (5 MG TOTAL) BY MOUTH DAILY.   XTANDI 40 MG capsule Generic drug:  enzalutamide Take 160 mg by mouth daily.      Follow-up Information    Lance Sell, NP. Schedule an appointment as soon as possible for a visit in 2 week(s).   Specialty:  Nurse Practitioner Contact information: [place] [address] [phone]        Mauri Pole, MD Follow up on 06/19/2017.   Specialty:  Gastroenterology Why:  10:15 am Contact information: [place] [address] [phone]           Consultations:  GI  Procedures/Studies:  EGD 1/14  Impression:               - Dilation in the entire esophagus.                           - Food in the esophagus. Removal was successful.                           - Esophagogastric landmarks identified.                           - Normal stomach.                           - Normal examined duodenum.                           - No gross lesions in esophagus. Dilated.    Dg Chest 2 View  Result Date: 04/18/2017 CLINICAL DATA:  Tachycardic and failure to thrive.  Achalasia. EXAM: CHEST  2 VIEW COMPARISON:  01/18/2017 and CT 01/23/2017 FINDINGS: Lungs are adequately inflated without focal consolidation or effusion. Possible nodule opacity in the retrosternal area. Cardiomediastinal silhouette and remainder of the exam is unchanged. IMPRESSION: No acute cardiopulmonary disease. Possible nodular density in the retrosternal region. Recommend follow-up PA and lateral chest radiograph versus noncontrast chest CT on elective basis for further evaluation. Electronically Signed   By: Marin Olp M.D.   On: 04/18/2017 19:40   Dg Esophagus Dilation  Result Date: 04/22/2017 ESOPHAGEAL DILATATION: Fluoroscopy was provided for use by the requesting physician.  No  images were obtained for radiographic interpretation.  Dg Esophagus W/water Sol Cm  Result Date: 04/22/2017 CLINICAL DATA:  Achalasia, status post balloon dilatation of the esophagus, evaluate for leak EXAM: ESOPHOGRAM/BARIUM SWALLOW TECHNIQUE: Single contrast examination was performed using  water soluble. FLUOROSCOPY TIME:  Fluoroscopy Time:  1.6 minutes Radiation Exposure Index (if provided by the fluoroscopic device): 14.8 mGy Number of Acquired Spot Images: 7 COMPARISON:  Esophagram dated 01/24/2017 FINDINGS: Dilated mid/distal esophagus, compatible with the history achalasia. Minimal contrast  passed into the stomach after 5 minutes, suggesting a residual lower esophageal narrowing/stricture. No evidence of leak. IMPRESSION: No evidence of leak. Achalasia with suspected residual lower esophageal narrowing/stricture. Electronically Signed   By: Julian Hy M.D.   On: 04/22/2017 15:20      Subjective: - no chest pain, shortness of breath, no abdominal pain, nausea or vomiting.   Discharge Exam: Vitals:   04/22/17 2117 04/23/17 0439  BP: 132/66 112/61  Pulse: 74 64  Resp: 20 20  Temp: 97.6 F (36.4 C) (!) 97.5 F (36.4 C)  SpO2: 100% 100%    General: Pt is alert, awake, not in acute distress Cardiovascular: RRR, S1/S2 +, no rubs, no gallops Respiratory: CTA bilaterally, no wheezing, no rhonchi Abdominal: Soft, NT, ND, bowel sounds +   The results of significant diagnostics from this hospitalization (including imaging, microbiology, ancillary and laboratory) are listed below for reference.     Microbiology: No results found for this or any previous visit (from the past 240 hour(s)).   Labs: BNP (last 3 results) Recent Labs    01/21/17 2348  BNP 703.5*   Basic Metabolic Panel: Recent Labs  Lab 04/18/17 1832 04/19/17 0540 04/20/17 0553 04/21/17 0650 04/22/17 0606  NA 152* 152* 147* 140 139  K 3.6 3.4* 3.0* 4.2 4.6  CL 112* 114* 111 109 106  CO2 21* 23 26 25  24   GLUCOSE 238* 154* 87 89 130*  BUN 67* 58* 45* 32* 31*  CREATININE 2.58* 2.09* 1.89* 1.67* 1.80*  CALCIUM 8.7* 8.5* 8.5* 8.2* 8.5*   Liver Function Tests: Recent Labs  Lab 04/18/17 2107  AST 38  ALT 8*  ALKPHOS 77  BILITOT 1.5*  PROT 4.6*  ALBUMIN 2.0*   No results for input(s): LIPASE, AMYLASE in the last 168 hours. No results for input(s): AMMONIA in the last 168 hours. CBC: Recent Labs  Lab 04/18/17 2049 04/19/17 0540 04/20/17 0553 04/21/17 0650 04/22/17 0606  WBC 7.9 8.1 4.5 5.0 10.8*  HGB 9.3* 8.6* 7.5* 7.1* 8.5*  HCT 28.2* 26.7* 22.3* 21.2* 25.1*  MCV 98.9 97.4 97.4 97.2 96.2  PLT 265 237 200 176 197   Cardiac Enzymes: No results for input(s): CKTOTAL, CKMB, CKMBINDEX, TROPONINI in the last 168 hours. BNP: Invalid input(s): POCBNP CBG: Recent Labs  Lab 04/23/17 0007 04/23/17 0438 04/23/17 0507 04/23/17 0719 04/23/17 1138  GLUCAP 87 51* 61* 87 88   D-Dimer No results for input(s): DDIMER in the last 72 hours. Hgb A1c No results for input(s): HGBA1C in the last 72 hours. Lipid Profile No results for input(s): CHOL, HDL, LDLCALC, TRIG, CHOLHDL, LDLDIRECT in the last 72 hours. Thyroid function studies No results for input(s): TSH, T4TOTAL, T3FREE, THYROIDAB in the last 72 hours.  Invalid input(s): FREET3 Anemia work up No results for input(s): VITAMINB12, FOLATE, FERRITIN, TIBC, IRON, RETICCTPCT in the last 72 hours. Urinalysis    Component Value Date/Time   COLORURINE YELLOW 04/18/2017 2049   APPEARANCEUR CLEAR 04/18/2017 2049   LABSPEC 1.014 04/18/2017 2049   PHURINE 5.0 04/18/2017 2049   GLUCOSEU NEGATIVE 04/18/2017 2049   GLUCOSEU NEGATIVE 09/22/2014 0757   HGBUR NEGATIVE 04/18/2017 2049   BILIRUBINUR NEGATIVE 04/18/2017 2049   BILIRUBINUR n 04/30/2011 1534   KETONESUR 20 (A) 04/18/2017 2049   PROTEINUR NEGATIVE 04/18/2017 2049   UROBILINOGEN 0.2 09/22/2014 0757   NITRITE NEGATIVE 04/18/2017 2049   LEUKOCYTESUR NEGATIVE 04/18/2017  2049   Sepsis Labs Invalid input(s): PROCALCITONIN,  WBC,  LACTICIDVEN   Time coordinating  discharge: 35 minutes  SIGNED:  Marzetta Board, MD  [facility] 04/23/2017, 12:23 PM Pager [phone]  If 7PM-7AM, please contact night-coverage [email or web address] Password [email or web address]

## 2017-04-23 NOTE — Progress Notes (Signed)
Pts IV removed with a clean and dry dressing intact. Pt denies pain at the time of d/c with no s/s of distress noted. Pt taken to the main entrance via wheelchair with nursing staff and family present.

## 2017-04-24 ENCOUNTER — Telehealth: Payer: Self-pay | Admitting: *Deleted

## 2017-04-24 ENCOUNTER — Telehealth: Payer: Self-pay | Admitting: Nurse Practitioner

## 2017-04-24 DIAGNOSIS — D631 Anemia in chronic kidney disease: Secondary | ICD-10-CM | POA: Diagnosis not present

## 2017-04-24 DIAGNOSIS — R627 Adult failure to thrive: Secondary | ICD-10-CM | POA: Diagnosis not present

## 2017-04-24 DIAGNOSIS — I129 Hypertensive chronic kidney disease with stage 1 through stage 4 chronic kidney disease, or unspecified chronic kidney disease: Secondary | ICD-10-CM | POA: Diagnosis not present

## 2017-04-24 DIAGNOSIS — E1122 Type 2 diabetes mellitus with diabetic chronic kidney disease: Secondary | ICD-10-CM | POA: Diagnosis not present

## 2017-04-24 DIAGNOSIS — N183 Chronic kidney disease, stage 3 (moderate): Secondary | ICD-10-CM | POA: Diagnosis not present

## 2017-04-24 DIAGNOSIS — Z86718 Personal history of other venous thrombosis and embolism: Secondary | ICD-10-CM | POA: Diagnosis not present

## 2017-04-24 NOTE — Telephone Encounter (Signed)
I am okay with giving VO for PT

## 2017-04-24 NOTE — Telephone Encounter (Signed)
Copied from [place]. Topic: Inquiry >> Apr 24, 2017  2:19 PM Corie Chiquito, [place] wrote: Reason for CRM: Urban Gibson called because she needs verbal orders for PT 3 times a week for 2 weeks then 2 times a week for 7 weeks. If someone could give her a call back about this at [phone]

## 2017-04-24 NOTE — Telephone Encounter (Signed)
Transition Care Management Follow-up Telephone Call   Date discharged? 04/23/17   How have you been since you were released from the hospital? Spoke w/wife she states he is doing fine   Do you understand why you were in the hospital? YES   Do you understand the discharge instructions? YES   Where were you discharged to? Home   Items Reviewed:  Medications reviewed: YES  Allergies reviewed: YES  Dietary changes reviewed: YES  Referrals reviewed: wife states he have an appt w/GI provider sometime in [place]:   Activities of Daily Living (ADLs):   she states he are independent in the following: bathing and hygiene, feeding, continence, grooming, toileting and dressing States he require assistance with the following: ambulation   Any transportation issues/concerns?: NO   Any patient concerns? NO   Confirmed importance and date/time of follow-up visits scheduled yes, appt 05/02/17  Provider Appointment booked with NP, Caesar Chestnut   Confirmed with patient if condition begins to worsen call PCP or go to the ER.  Patient was given the office number and encouraged to call back with question or concerns.  : YES

## 2017-04-25 DIAGNOSIS — R627 Adult failure to thrive: Secondary | ICD-10-CM | POA: Diagnosis not present

## 2017-04-25 DIAGNOSIS — D631 Anemia in chronic kidney disease: Secondary | ICD-10-CM | POA: Diagnosis not present

## 2017-04-25 DIAGNOSIS — N183 Chronic kidney disease, stage 3 (moderate): Secondary | ICD-10-CM | POA: Diagnosis not present

## 2017-04-25 DIAGNOSIS — Z86718 Personal history of other venous thrombosis and embolism: Secondary | ICD-10-CM | POA: Diagnosis not present

## 2017-04-25 DIAGNOSIS — E1122 Type 2 diabetes mellitus with diabetic chronic kidney disease: Secondary | ICD-10-CM | POA: Diagnosis not present

## 2017-04-25 DIAGNOSIS — I129 Hypertensive chronic kidney disease with stage 1 through stage 4 chronic kidney disease, or unspecified chronic kidney disease: Secondary | ICD-10-CM | POA: Diagnosis not present

## 2017-04-25 NOTE — Telephone Encounter (Signed)
Notified caitlyn w/Ashleigh response.Marland KitchenJohny Chess

## 2017-04-26 DIAGNOSIS — Z86718 Personal history of other venous thrombosis and embolism: Secondary | ICD-10-CM | POA: Diagnosis not present

## 2017-04-26 DIAGNOSIS — R627 Adult failure to thrive: Secondary | ICD-10-CM | POA: Diagnosis not present

## 2017-04-26 DIAGNOSIS — D631 Anemia in chronic kidney disease: Secondary | ICD-10-CM | POA: Diagnosis not present

## 2017-04-26 DIAGNOSIS — E1122 Type 2 diabetes mellitus with diabetic chronic kidney disease: Secondary | ICD-10-CM | POA: Diagnosis not present

## 2017-04-26 DIAGNOSIS — I129 Hypertensive chronic kidney disease with stage 1 through stage 4 chronic kidney disease, or unspecified chronic kidney disease: Secondary | ICD-10-CM | POA: Diagnosis not present

## 2017-04-26 DIAGNOSIS — N183 Chronic kidney disease, stage 3 (moderate): Secondary | ICD-10-CM | POA: Diagnosis not present

## 2017-04-27 ENCOUNTER — Other Ambulatory Visit: Payer: Self-pay

## 2017-04-27 ENCOUNTER — Emergency Department (HOSPITAL_COMMUNITY): Payer: Medicare Other

## 2017-04-27 ENCOUNTER — Inpatient Hospital Stay (HOSPITAL_COMMUNITY)
Admission: EM | Admit: 2017-04-27 | Discharge: 2017-06-07 | DRG: 870 | Disposition: E | Payer: Medicare Other | Attending: Internal Medicine | Admitting: Internal Medicine

## 2017-04-27 DIAGNOSIS — R402312 Coma scale, best motor response, none, at arrival to emergency department: Secondary | ICD-10-CM | POA: Diagnosis present

## 2017-04-27 DIAGNOSIS — E161 Other hypoglycemia: Secondary | ICD-10-CM | POA: Diagnosis not present

## 2017-04-27 DIAGNOSIS — J189 Pneumonia, unspecified organism: Secondary | ICD-10-CM

## 2017-04-27 DIAGNOSIS — Z8249 Family history of ischemic heart disease and other diseases of the circulatory system: Secondary | ICD-10-CM | POA: Diagnosis not present

## 2017-04-27 DIAGNOSIS — E872 Acidosis: Secondary | ICD-10-CM | POA: Diagnosis present

## 2017-04-27 DIAGNOSIS — G40119 Localization-related (focal) (partial) symptomatic epilepsy and epileptic syndromes with simple partial seizures, intractable, without status epilepticus: Secondary | ICD-10-CM | POA: Diagnosis not present

## 2017-04-27 DIAGNOSIS — E1122 Type 2 diabetes mellitus with diabetic chronic kidney disease: Secondary | ICD-10-CM | POA: Diagnosis present

## 2017-04-27 DIAGNOSIS — R402212 Coma scale, best verbal response, none, at arrival to emergency department: Secondary | ICD-10-CM | POA: Diagnosis present

## 2017-04-27 DIAGNOSIS — Z978 Presence of other specified devices: Secondary | ICD-10-CM

## 2017-04-27 DIAGNOSIS — Z8546 Personal history of malignant neoplasm of prostate: Secondary | ICD-10-CM

## 2017-04-27 DIAGNOSIS — I130 Hypertensive heart and chronic kidney disease with heart failure and stage 1 through stage 4 chronic kidney disease, or unspecified chronic kidney disease: Secondary | ICD-10-CM | POA: Diagnosis present

## 2017-04-27 DIAGNOSIS — Z833 Family history of diabetes mellitus: Secondary | ICD-10-CM | POA: Diagnosis not present

## 2017-04-27 DIAGNOSIS — E1129 Type 2 diabetes mellitus with other diabetic kidney complication: Secondary | ICD-10-CM | POA: Diagnosis present

## 2017-04-27 DIAGNOSIS — R4020 Unspecified coma: Secondary | ICD-10-CM | POA: Diagnosis not present

## 2017-04-27 DIAGNOSIS — N184 Chronic kidney disease, stage 4 (severe): Secondary | ICD-10-CM | POA: Diagnosis present

## 2017-04-27 DIAGNOSIS — G9341 Metabolic encephalopathy: Secondary | ICD-10-CM | POA: Diagnosis present

## 2017-04-27 DIAGNOSIS — E876 Hypokalemia: Secondary | ICD-10-CM | POA: Diagnosis present

## 2017-04-27 DIAGNOSIS — R569 Unspecified convulsions: Secondary | ICD-10-CM | POA: Diagnosis not present

## 2017-04-27 DIAGNOSIS — N179 Acute kidney failure, unspecified: Secondary | ICD-10-CM | POA: Diagnosis present

## 2017-04-27 DIAGNOSIS — R402112 Coma scale, eyes open, never, at arrival to emergency department: Secondary | ICD-10-CM | POA: Diagnosis present

## 2017-04-27 DIAGNOSIS — D631 Anemia in chronic kidney disease: Secondary | ICD-10-CM | POA: Diagnosis not present

## 2017-04-27 DIAGNOSIS — J690 Pneumonitis due to inhalation of food and vomit: Secondary | ICD-10-CM | POA: Diagnosis present

## 2017-04-27 DIAGNOSIS — Z7984 Long term (current) use of oral hypoglycemic drugs: Secondary | ICD-10-CM

## 2017-04-27 DIAGNOSIS — J9601 Acute respiratory failure with hypoxia: Secondary | ICD-10-CM | POA: Diagnosis not present

## 2017-04-27 DIAGNOSIS — Z87891 Personal history of nicotine dependence: Secondary | ICD-10-CM | POA: Diagnosis not present

## 2017-04-27 DIAGNOSIS — N183 Chronic kidney disease, stage 3 unspecified: Secondary | ICD-10-CM

## 2017-04-27 DIAGNOSIS — E7220 Disorder of urea cycle metabolism, unspecified: Secondary | ICD-10-CM | POA: Diagnosis present

## 2017-04-27 DIAGNOSIS — Z86718 Personal history of other venous thrombosis and embolism: Secondary | ICD-10-CM

## 2017-04-27 DIAGNOSIS — I469 Cardiac arrest, cause unspecified: Secondary | ICD-10-CM | POA: Diagnosis not present

## 2017-04-27 DIAGNOSIS — D72819 Decreased white blood cell count, unspecified: Secondary | ICD-10-CM | POA: Diagnosis present

## 2017-04-27 DIAGNOSIS — E11641 Type 2 diabetes mellitus with hypoglycemia with coma: Secondary | ICD-10-CM | POA: Diagnosis present

## 2017-04-27 DIAGNOSIS — G931 Anoxic brain damage, not elsewhere classified: Secondary | ICD-10-CM | POA: Diagnosis present

## 2017-04-27 DIAGNOSIS — D509 Iron deficiency anemia, unspecified: Secondary | ICD-10-CM | POA: Diagnosis present

## 2017-04-27 DIAGNOSIS — Z4682 Encounter for fitting and adjustment of non-vascular catheter: Secondary | ICD-10-CM | POA: Diagnosis not present

## 2017-04-27 DIAGNOSIS — D649 Anemia, unspecified: Secondary | ICD-10-CM | POA: Diagnosis not present

## 2017-04-27 DIAGNOSIS — E162 Hypoglycemia, unspecified: Secondary | ICD-10-CM | POA: Diagnosis not present

## 2017-04-27 DIAGNOSIS — Z515 Encounter for palliative care: Secondary | ICD-10-CM | POA: Diagnosis not present

## 2017-04-27 DIAGNOSIS — I5032 Chronic diastolic (congestive) heart failure: Secondary | ICD-10-CM | POA: Diagnosis present

## 2017-04-27 DIAGNOSIS — K219 Gastro-esophageal reflux disease without esophagitis: Secondary | ICD-10-CM | POA: Diagnosis present

## 2017-04-27 DIAGNOSIS — G4089 Other seizures: Secondary | ICD-10-CM | POA: Diagnosis present

## 2017-04-27 DIAGNOSIS — D638 Anemia in other chronic diseases classified elsewhere: Secondary | ICD-10-CM | POA: Diagnosis present

## 2017-04-27 DIAGNOSIS — E785 Hyperlipidemia, unspecified: Secondary | ICD-10-CM | POA: Diagnosis present

## 2017-04-27 DIAGNOSIS — R402431 Glasgow coma scale score 3-8, in the field [EMT or ambulance]: Secondary | ICD-10-CM | POA: Diagnosis not present

## 2017-04-27 DIAGNOSIS — R918 Other nonspecific abnormal finding of lung field: Secondary | ICD-10-CM | POA: Diagnosis not present

## 2017-04-27 DIAGNOSIS — K220 Achalasia of cardia: Secondary | ICD-10-CM | POA: Diagnosis not present

## 2017-04-27 DIAGNOSIS — R627 Adult failure to thrive: Secondary | ICD-10-CM | POA: Diagnosis not present

## 2017-04-27 DIAGNOSIS — J9811 Atelectasis: Secondary | ICD-10-CM | POA: Diagnosis not present

## 2017-04-27 DIAGNOSIS — R4182 Altered mental status, unspecified: Secondary | ICD-10-CM | POA: Diagnosis not present

## 2017-04-27 DIAGNOSIS — Z9911 Dependence on respirator [ventilator] status: Secondary | ICD-10-CM | POA: Diagnosis not present

## 2017-04-27 DIAGNOSIS — M7989 Other specified soft tissue disorders: Secondary | ICD-10-CM | POA: Diagnosis not present

## 2017-04-27 DIAGNOSIS — J9690 Respiratory failure, unspecified, unspecified whether with hypoxia or hypercapnia: Secondary | ICD-10-CM

## 2017-04-27 DIAGNOSIS — I129 Hypertensive chronic kidney disease with stage 1 through stage 4 chronic kidney disease, or unspecified chronic kidney disease: Secondary | ICD-10-CM | POA: Diagnosis not present

## 2017-04-27 DIAGNOSIS — A419 Sepsis, unspecified organism: Secondary | ICD-10-CM | POA: Diagnosis not present

## 2017-04-27 DIAGNOSIS — I272 Pulmonary hypertension, unspecified: Secondary | ICD-10-CM | POA: Diagnosis present

## 2017-04-27 DIAGNOSIS — Z66 Do not resuscitate: Secondary | ICD-10-CM | POA: Diagnosis not present

## 2017-04-27 DIAGNOSIS — G40109 Localization-related (focal) (partial) symptomatic epilepsy and epileptic syndromes with simple partial seizures, not intractable, without status epilepticus: Secondary | ICD-10-CM | POA: Diagnosis not present

## 2017-04-27 LAB — CBC WITH DIFFERENTIAL/PLATELET
Basophils Absolute: 0 10*3/uL (ref 0.0–0.1)
Basophils Relative: 0 %
EOS ABS: 0 10*3/uL (ref 0.0–0.7)
EOS PCT: 0 %
HCT: 22.7 % — ABNORMAL LOW (ref 39.0–52.0)
Hemoglobin: 7.5 g/dL — ABNORMAL LOW (ref 13.0–17.0)
LYMPHS ABS: 0.3 10*3/uL — AB (ref 0.7–4.0)
Lymphocytes Relative: 11 %
MCH: 31.1 pg (ref 26.0–34.0)
MCHC: 33 g/dL (ref 30.0–36.0)
MCV: 94.2 fL (ref 78.0–100.0)
Monocytes Absolute: 0.3 10*3/uL (ref 0.1–1.0)
Monocytes Relative: 11 %
Neutro Abs: 2.3 10*3/uL (ref 1.7–7.7)
Neutrophils Relative %: 78 %
PLATELETS: 261 10*3/uL (ref 150–400)
RBC: 2.41 MIL/uL — AB (ref 4.22–5.81)
RDW: 13.9 % (ref 11.5–15.5)
WBC: 3 10*3/uL — AB (ref 4.0–10.5)

## 2017-04-27 LAB — URINALYSIS, COMPLETE (UACMP) WITH MICROSCOPIC
Bilirubin Urine: NEGATIVE
GLUCOSE, UA: NEGATIVE mg/dL
Ketones, ur: NEGATIVE mg/dL
Leukocytes, UA: NEGATIVE
Nitrite: NEGATIVE
PROTEIN: 30 mg/dL — AB
SPECIFIC GRAVITY, URINE: 1.021 (ref 1.005–1.030)
pH: 5 (ref 5.0–8.0)

## 2017-04-27 LAB — I-STAT ARTERIAL BLOOD GAS, ED
Acid-base deficit: 2 mmol/L (ref 0.0–2.0)
BICARBONATE: 20.5 mmol/L (ref 20.0–28.0)
O2 Saturation: 94 %
PCO2 ART: 24.6 mmHg — AB (ref 32.0–48.0)
PO2 ART: 58 mmHg — AB (ref 83.0–108.0)
Patient temperature: 96.8
TCO2: 21 mmol/L — ABNORMAL LOW (ref 22–32)
pH, Arterial: 7.525 — ABNORMAL HIGH (ref 7.350–7.450)

## 2017-04-27 LAB — CREATININE, SERUM
CREATININE: 2.02 mg/dL — AB (ref 0.61–1.24)
GFR, EST AFRICAN AMERICAN: 35 mL/min — AB (ref >60)
GFR, EST NON AFRICAN AMERICAN: 30 mL/min — AB (ref >60)

## 2017-04-27 LAB — RAPID URINE DRUG SCREEN, HOSP PERFORMED
Amphetamines: NOT DETECTED
BARBITURATES: NOT DETECTED
BENZODIAZEPINES: NOT DETECTED
COCAINE: NOT DETECTED
Opiates: NOT DETECTED
TETRAHYDROCANNABINOL: NOT DETECTED

## 2017-04-27 LAB — GLUCOSE, CAPILLARY
GLUCOSE-CAPILLARY: 105 mg/dL — AB (ref 65–99)
GLUCOSE-CAPILLARY: 95 mg/dL (ref 65–99)
GLUCOSE-CAPILLARY: 71 mg/dL (ref 65–99)
GLUCOSE-CAPILLARY: 87 mg/dL (ref 65–99)
Glucose-Capillary: 134 mg/dL — ABNORMAL HIGH (ref 65–99)
Glucose-Capillary: 123 mg/dL — ABNORMAL HIGH (ref 65–99)
Glucose-Capillary: 102 mg/dL — ABNORMAL HIGH (ref 65–99)
Glucose-Capillary: 77 mg/dL (ref 65–99)

## 2017-04-27 LAB — I-STAT CG4 LACTIC ACID, ED: Lactic Acid, Venous: 2.22 mmol/L (ref 0.5–1.9)

## 2017-04-27 LAB — I-STAT CHEM 8, ED
BUN: 24 mg/dL — AB (ref 6–20)
CHLORIDE: 104 mmol/L (ref 101–111)
CREATININE: 1.9 mg/dL — AB (ref 0.61–1.24)
Calcium, Ion: 1.07 mmol/L — ABNORMAL LOW (ref 1.15–1.40)
Glucose, Bld: 83 mg/dL (ref 65–99)
HCT: 29 % — ABNORMAL LOW (ref 39.0–52.0)
Hemoglobin: 9.9 g/dL — ABNORMAL LOW (ref 13.0–17.0)
POTASSIUM: 4.1 mmol/L (ref 3.5–5.1)
Sodium: 138 mmol/L (ref 135–145)
TCO2: 23 mmol/L (ref 22–32)

## 2017-04-27 LAB — COMPREHENSIVE METABOLIC PANEL
ALK PHOS: 83 U/L (ref 38–126)
ALT: 12 U/L — AB (ref 17–63)
AST: 28 U/L (ref 15–41)
Albumin: 2.1 g/dL — ABNORMAL LOW (ref 3.5–5.0)
Anion gap: 13 (ref 5–15)
BILIRUBIN TOTAL: 0.7 mg/dL (ref 0.3–1.2)
BUN: 25 mg/dL — AB (ref 6–20)
CHLORIDE: 105 mmol/L (ref 101–111)
CO2: 21 mmol/L — ABNORMAL LOW (ref 22–32)
CREATININE: 2.05 mg/dL — AB (ref 0.61–1.24)
Calcium: 8.1 mg/dL — ABNORMAL LOW (ref 8.9–10.3)
GFR calc Af Amer: 35 mL/min — ABNORMAL LOW (ref >60)
GFR, EST NON AFRICAN AMERICAN: 30 mL/min — AB (ref >60)
Glucose, Bld: 83 mg/dL (ref 65–99)
Potassium: 4.2 mmol/L (ref 3.5–5.1)
Sodium: 139 mmol/L (ref 135–145)
TOTAL PROTEIN: 4.6 g/dL — AB (ref 6.5–8.1)

## 2017-04-27 LAB — BLOOD GAS, ARTERIAL
ACID-BASE DEFICIT: 3.2 mmol/L — AB (ref 0.0–2.0)
BICARBONATE: 20.1 mmol/L (ref 20.0–28.0)
Drawn by: 244851
FIO2: 100
LHR: 14.9 {breaths}/min
MECHVT: 500 mL
O2 SAT: 99.5 %
PATIENT TEMPERATURE: 93.2
PCO2 ART: 24.9 mmHg — AB (ref 32.0–48.0)
PEEP: 5 cmH2O
PH ART: 7.503 — AB (ref 7.350–7.450)
pO2, Arterial: 280 mmHg — ABNORMAL HIGH (ref 83.0–108.0)

## 2017-04-27 LAB — AMMONIA: Ammonia: 13 umol/L (ref 9–35)

## 2017-04-27 LAB — CBC
HCT: 25.3 % — ABNORMAL LOW (ref 39.0–52.0)
HEMOGLOBIN: 8.5 g/dL — AB (ref 13.0–17.0)
MCH: 31.7 pg (ref 26.0–34.0)
MCHC: 33.6 g/dL (ref 30.0–36.0)
MCV: 94.4 fL (ref 78.0–100.0)
Platelets: 207 10*3/uL (ref 150–400)
RBC: 2.68 MIL/uL — AB (ref 4.22–5.81)
RDW: 14 % (ref 11.5–15.5)
WBC: 4.1 10*3/uL (ref 4.0–10.5)

## 2017-04-27 LAB — LACTIC ACID, PLASMA
Lactic Acid, Venous: 4.5 mmol/L (ref 0.5–1.9)
Lactic Acid, Venous: 5.5 mmol/L (ref 0.5–1.9)

## 2017-04-27 LAB — BRAIN NATRIURETIC PEPTIDE
B NATRIURETIC PEPTIDE 5: 558.2 pg/mL — AB (ref 0.0–100.0)
B Natriuretic Peptide: 436.5 pg/mL — ABNORMAL HIGH (ref 0.0–100.0)

## 2017-04-27 LAB — CBG MONITORING, ED
GLUCOSE-CAPILLARY: 68 mg/dL (ref 65–99)
Glucose-Capillary: 138 mg/dL — ABNORMAL HIGH (ref 65–99)

## 2017-04-27 LAB — PROTIME-INR
INR: 0.93
Prothrombin Time: 12.4 seconds (ref 11.4–15.2)

## 2017-04-27 LAB — MRSA PCR SCREENING: MRSA by PCR: POSITIVE — AB

## 2017-04-27 LAB — OCCULT BLOOD GASTRIC / DUODENUM (SPECIMEN CUP): Occult Blood, Gastric: POSITIVE — AB

## 2017-04-27 LAB — TROPONIN I: Troponin I: <0.03 ng/mL (ref <0.03)

## 2017-04-27 LAB — ETHANOL: Alcohol, Ethyl (B): <10 mg/dL (ref <10)

## 2017-04-27 LAB — CORTISOL: CORTISOL PLASMA: 33.2 ug/dL

## 2017-04-27 MED ORDER — ASPIRIN 81 MG PO CHEW: 324.0000 mg | CHEWABLE_TABLET | ORAL | Status: AC

## 2017-04-27 MED ORDER — ETOMIDATE 2 MG/ML IV SOLN
20.0000 mg | Freq: Once | INTRAVENOUS | Status: AC
  Administered 2017-04-27: 20 mg via INTRAVENOUS

## 2017-04-27 MED ORDER — NALOXONE HCL 2 MG/2ML IJ SOSY
2.0000 mg | PREFILLED_SYRINGE | Freq: Once | INTRAMUSCULAR | Status: AC
  Administered 2017-04-27: 2 mg via INTRAVENOUS
  Filled 2017-04-27: qty 2

## 2017-04-27 MED ORDER — ACETAMINOPHEN 325 MG PO TABS
650.0000 mg | ORAL_TABLET | Freq: Four times a day (QID) | ORAL | Status: DC | PRN
Start: 2017-04-27 — End: 2017-05-03
  Administered 2017-04-29: 650 mg via ORAL
  Filled 2017-04-27 (×2): qty 2

## 2017-04-27 MED ORDER — PROPOFOL 1000 MG/100ML IV EMUL
5.0000 ug/kg/min | Freq: Once | INTRAVENOUS | Status: AC
  Administered 2017-04-27: 5 ug/kg/min via INTRAVENOUS

## 2017-04-27 MED ORDER — SODIUM CHLORIDE 0.9 % IV SOLN
250.0000 mL | INTRAVENOUS | Status: DC | PRN
  Administered 2017-04-27: 250 mL via INTRAVENOUS

## 2017-04-27 MED ORDER — VANCOMYCIN HCL IN DEXTROSE 1-5 GM/200ML-% IV SOLN
1000.0000 mg | Freq: Once | INTRAVENOUS | Status: AC
  Administered 2017-04-27: 1000 mg via INTRAVENOUS
  Filled 2017-04-27: qty 200

## 2017-04-27 MED ORDER — ROCURONIUM BROMIDE 50 MG/5ML IV SOLN
1.0000 mg/kg | Freq: Once | INTRAVENOUS | Status: AC
  Administered 2017-04-27: 54 mg via INTRAVENOUS
  Filled 2017-04-27: qty 5.4

## 2017-04-27 MED ORDER — PROPOFOL 1000 MG/100ML IV EMUL
INTRAVENOUS | Status: AC
  Filled 2017-04-27: qty 100

## 2017-04-27 MED ORDER — FAMOTIDINE IN NACL 20-0.9 MG/50ML-% IV SOLN
20.0000 mg | Freq: Two times a day (BID) | INTRAVENOUS | Status: DC
  Administered 2017-04-27 – 2017-04-29 (×4): 20 mg via INTRAVENOUS
  Filled 2017-04-27 (×4): qty 50

## 2017-04-27 MED ORDER — SODIUM CHLORIDE 0.9 % IV SOLN: 250.0000 mL | INTRAVENOUS | Status: DC | PRN

## 2017-04-27 MED ORDER — PIPERACILLIN-TAZOBACTAM 3.375 G IVPB
3.3750 g | Freq: Three times a day (TID) | INTRAVENOUS | Status: DC
  Administered 2017-04-27 – 2017-05-01 (×10): 3.375 g via INTRAVENOUS
  Filled 2017-04-27 (×12): qty 50

## 2017-04-27 MED ORDER — VANCOMYCIN HCL 500 MG IV SOLR
500.0000 mg | INTRAVENOUS | Status: DC
  Administered 2017-04-28 – 2017-04-30 (×3): 500 mg via INTRAVENOUS
  Filled 2017-04-27 (×4): qty 500

## 2017-04-27 MED ORDER — HEPARIN SODIUM (PORCINE) 5000 UNIT/ML IJ SOLN: 5000.0000 [IU] | Freq: Three times a day (TID) | INTRAMUSCULAR | Status: DC

## 2017-04-27 MED ORDER — DEXTROSE 10 % IV SOLN
100.0000 mL | Freq: Once | INTRAVENOUS | Status: AC
  Administered 2017-04-27: 100 mL via INTRAVENOUS

## 2017-04-27 MED ORDER — SODIUM CHLORIDE 0.9% FLUSH
3.0000 mL | Freq: Two times a day (BID) | INTRAVENOUS | Status: DC
  Administered 2017-04-29 (×2): 3 mL via INTRAVENOUS

## 2017-04-27 MED ORDER — SODIUM CHLORIDE 0.9% FLUSH: 3.0000 mL | INTRAVENOUS | Status: DC | PRN

## 2017-04-27 MED ORDER — PIPERACILLIN-TAZOBACTAM 3.375 G IVPB 30 MIN
3.3750 g | Freq: Once | INTRAVENOUS | Status: AC
  Administered 2017-04-27: 3.375 g via INTRAVENOUS
  Filled 2017-04-27: qty 50

## 2017-04-27 MED ORDER — DEXTROSE-NACL 5-0.9 % IV SOLN
INTRAVENOUS | Status: DC
  Administered 2017-04-27 – 2017-04-28 (×2): via INTRAVENOUS

## 2017-04-27 MED ORDER — ASPIRIN 300 MG RE SUPP: 300.0000 mg | RECTAL | Status: AC

## 2017-04-27 NOTE — ED Notes (Signed)
Checked patient cbg it was 77 notified RN Santiago Glad

## 2017-04-27 NOTE — ED Provider Notes (Signed)
[name] 43M MEDICAL ICU Provider Note   CSN: [id number] Arrival date & time: 04/18/2017  1004  LEVEL 5 CAVEAT - UNRESPONSIVE   History   Chief Complaint Chief Complaint  Patient presents with  . Altered Mental Status  . Hypoglycemia    HPI [name] is a 77 y.o. male.  HPI  77 year old male brought in by EMS for unresponsiveness.  Wife called EMS because he was found in the bed and unable to be aroused.  Initial glucose 39, given D10 and came up into the 200s but now trending down to 112.  Initially had blood pressure of 98 but since then blood pressures have been normal.  Despite the glucose normalizing, the patient has not aroused.  He is still breathing on his own and requiring 3 L of oxygen.  Family told EMS that the patient has had a steady decline since Christmas with less and less energy, less moving around, and less eating.  Recently had an EGD and esophageal dilation for achalasia.  Past Medical History:  Diagnosis Date  . Anemia    IRON TRANSFUSION 1 MONTH AGO  . Blood clot in vein 2008   LEFT LEG  . Cataract    LEFT  . CKD (chronic kidney disease) 02/2016  . Diabetes mellitus, type 2 ([place] ) 2008  . GERD (gastroesophageal reflux disease)   . Hyperlipidemia 2008  . Hypertension 2008  . Pneumonia 01/18/2016  . Prostate cancer ([place]) 2006   IMPLANT IN LEFT ARM FOR TX    Patient Active Problem List   Diagnosis Date Noted  . Acute metabolic encephalopathy 75/64/3329  . Acute hypoxemic respiratory failure ([place]) 04/28/2017  . Leukopenia 04/28/2017  . Coma ([place]) 04/16/2017  . Failure to thrive in adult   . Esophageal dysphagia   . Cavitary lesion of lung 01/29/2017  . Hypotension 01/21/2017  . Chronic diastolic CHF (congestive heart failure) ([place]) 01/21/2017  . CKD (chronic kidney disease) stage 3, GFR 30-59 ml/min (HCC) 07/24/2016  . Hypoglycemia 04/11/2016  . Orthostatic hypotension 04/11/2016  . Sepsis ([place]) 02/21/2016  . Pressure  injury of skin 02/19/2016  . Hypernatremia 02/19/2016  . Acute esophagitis   . Food impaction of esophagus   . Achalasia, esophageal   . Acute GI bleeding 02/16/2016  . Acute blood loss anemia 02/16/2016  . Normocytic anemia 02/15/2016  . Right leg weakness 01/29/2016  . HCAP (healthcare-associated pneumonia) 01/28/2016  . Dehydration   . Hypomagnesemia   . Protein-calorie malnutrition, severe 01/09/2016  . Hyperglycemia 01/08/2016  . Hypokalemia 01/08/2016  . Acute renal failure superimposed on stage 3 chronic kidney disease ([place]) 01/08/2016  . Prolonged Q-T interval on ECG 01/08/2016  . 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 Bilateral lower extremity edema 09/07/2013  . Pulmonary embolus ([place]) 09/01/2010  . Chronic deep vein thrombosis (DVT) ([place]) 08/25/2010  . Type 2 diabetes mellitus with renal manifestations ([place]) 09/15/2006  . Hyperlipidemia associated with type 2 diabetes mellitus ([place]) 09/13/2006  . Essential hypertension 09/13/2006  . Prostate cancer metastatic to multiple sites [facility]) 09/13/2006    Past Surgical History:  Procedure Laterality Date  . BALLOON DILATION N/A 04/13/2016   Procedure: BALLOON DILATION;  Surgeon: Gatha Mayer, MD;  Location: [facility] ENDOSCOPY;  Service: Endoscopy;  Laterality: N/A;  . BALLOON DILATION N/A 04/22/2017   Procedure: PNEUMATIC BALLOON DILATION WITH FLORO;  Surgeon: Mauri Pole, MD;  Location: [facility] ENDOSCOPY;  Service: Endoscopy;  Laterality: N/A;  . BOTOX INJECTION N/A 04/13/2016  Procedure: BOTOX INJECTION;  Surgeon: Gatha Mayer, MD;  Location: [facility] ENDOSCOPY;  Service: Endoscopy;  Laterality: N/A;  . BOTOX INJECTION N/A 01/02/2017   Procedure: BOTOX INJECTION;  Surgeon: Yetta Flock, MD;  Location: [facility] ENDOSCOPY;  Service: Gastroenterology;  Laterality: N/A;  . COLONOSCOPY W/ POLYPECTOMY  03/2008   diminutive rectal polyp (path: prolapse type polyp, not adenoma).  moderate pan-diverticulitis.   Marland Kitchen ESOPHAGEAL MANOMETRY  1984    unable to pass manometry catheter beyond LES. Aperiystalsis of esophagus: sugg of achalasia.   Marland Kitchen ESOPHAGOGASTRODUODENOSCOPY N/A 02/17/2016   Procedure: ESOPHAGOGASTRODUODENOSCOPY (EGD);  Surgeon: [name], MD;  Location: [facility];  Service: Endoscopy;  Laterality: N/A;  . ESOPHAGOGASTRODUODENOSCOPY N/A 04/22/2017   Procedure: ESOPHAGOGASTRODUODENOSCOPY (EGD);  Surgeon: Mauri Pole, MD;  Location: [facility] ENDOSCOPY;  Service: Endoscopy;  Laterality: N/A;  . ESOPHAGOGASTRODUODENOSCOPY (EGD) WITH PROPOFOL N/A 02/21/2016   Procedure: ESOPHAGOGASTRODUODENOSCOPY (EGD) WITH PROPOFOL;  Surgeon: Jerene Bears, MD;  Location: [facility] ENDOSCOPY;  Service: Gastroenterology;  Laterality: N/A;  . ESOPHAGOGASTRODUODENOSCOPY (EGD) WITH PROPOFOL N/A 02/28/2016   Procedure: ESOPHAGOGASTRODUODENOSCOPY (EGD) WITH PROPOFOL withBOTOX;  Surgeon: Manus Gunning, MD;  Location: [facility] ENDOSCOPY;  Service: Gastroenterology;  Laterality: N/A;  . ESOPHAGOGASTRODUODENOSCOPY (EGD) WITH PROPOFOL N/A 04/13/2016   Procedure: ESOPHAGOGASTRODUODENOSCOPY (EGD) WITH PROPOFOL;  Surgeon: Gatha Mayer, MD;  Location: [place] [facility];  Service: Endoscopy;  Laterality: N/A;  . ESOPHAGOGASTRODUODENOSCOPY (EGD) WITH PROPOFOL N/A 01/02/2017   Procedure: ESOPHAGOGASTRODUODENOSCOPY (EGD) WITH PROPOFOL;  Surgeon: Yetta Flock, MD;  Location: [facility] ENDOSCOPY;  Service: Gastroenterology;  Laterality: N/A;  . LEFT ARM IMPLANT X2     RADIATION TX ALSO       Home Medications    Prior to Admission medications   Medication Sig Start Date End Date Taking? Authorizing Provider  Amino Acids-Protein Hydrolys (FEEDING SUPPLEMENT, PRO-STAT SUGAR FREE 64,) LIQD Take 30 mLs by mouth 2 (two) times daily. Patient taking differently: Take 30 mLs by mouth daily.  04/13/16  Yes Dessa Phi, DO  atorvastatin (LIPITOR) 10 MG tablet Take 1 tablet (10 mg total) by mouth at bedtime. 07/09/16  Yes Elayne Snare, MD  docusate sodium (COLACE) 100 MG  capsule TAKE ONE CAPSULE BY MOUTH TWICE A DAY FOR CONSTIPATION Patient taking differently: TAKE ONE CAPSULE (10 MG) BY MOUTH TWICE A DAY FOR CONSTIPATION 07/04/16  Yes Elayne Snare, MD  enzalutamide Gillermina Phy) 40 MG capsule Take 160 mg by mouth daily.   Yes [provider]  feeding supplement, ENSURE ENLIVE, (ENSURE ENLIVE) LIQD Take 237 mLs by mouth 2 (two) times daily between meals. 01/26/17  Yes Florencia Reasons, MD  ferrous sulfate 325 (65 FE) MG tablet TAKE 1 TABLET BY MOUTH EVERY DAY FOR SUPPLEMENT Patient taking differently: 325 mg. TAKE 1 TABLET BY MOUTH EVERY DAY FOR SUPPLEMENT 02/20/17  Yes Elayne Snare, MD  furosemide (LASIX) 80 MG tablet Take 0.5 tablets (40 mg total) by mouth daily. 04/23/17  Yes Gherghe, Vella Redhead, MD  glipiZIDE (GLUCOTROL XL) 5 MG 24 hr tablet Take 5 mg by mouth daily with breakfast.   Yes [provider]  metoprolol tartrate (LOPRESSOR) 25 MG tablet Take 0.5 tablets (12.5 mg total) by mouth 2 (two) times daily. 04/11/17  Yes Lance Sell, NP  Multiple Vitamin (MULTIVITAMIN WITH MINERALS) TABS tablet Take 1 tablet by mouth daily.   Yes [provider]  ondansetron (ZOFRAN-ODT) 4 MG disintegrating tablet Take 1 tablet (4 mg total) by mouth every 8 (eight) hours as needed for nausea or vomiting.  01/18/17  Yes Golden Circle, FNP  pantoprazole (PROTONIX) 40 MG tablet TAKE 1 TABLET TWICE A DAY Patient taking differently: TAKE 1 TABLET (40 MG) TWICE A DAY 02/14/17  Yes Elayne Snare, MD  pioglitazone (ACTOS) 15 MG tablet Take 1 tablet (15 mg total) daily by mouth. 02/12/17  Yes Elayne Snare, MD  TRADJENTA 5 MG TABS tablet TAKE 1 TABLET (5 MG TOTAL) BY MOUTH DAILY. 04/15/17  Yes Elayne Snare, MD  ACCU-CHEK FASTCLIX LANCETS MISC Use to check blood sugars twice daily E11.65 11/08/16   Elayne Snare, MD  ACCU-CHEK FASTCLIX LANCETS MISC USE TO CHECK BLOOD SUGARS TWICE DAILY E11.65 02/11/17   Elayne Snare, MD  glucose blood test strip Use as instructed 11/08/16   Elayne Snare, MD    Family History Family History  Problem Relation Age of Onset  . Diabetes Father   . Diabetes Sister   . Hypertension Sister   . Hypertension Brother   . Heart disease Neg Hx   . Colon cancer Neg Hx   . Stomach cancer Neg Hx     Social History Social History   Tobacco Use  . Smoking status: Former Smoker    Years: 10.00    Last attempt to quit: 2000    Years since quitting: 19.0  . Smokeless tobacco: Never Used  Substance Use Topics  . Alcohol use: No  . Drug use: No     Allergies   Patient has no known allergies.   Review of Systems Review of Systems  Unable to perform ROS: Patient nonverbal     Physical Exam Updated Vital Signs BP 117/83   Pulse 86   Temp (!) 93.6 F (34.2 C)   Resp 18   SpO2 100%   Physical Exam  Constitutional: He appears well-developed and well-nourished.  HENT:  Head: Normocephalic and atraumatic.  Right Ear: External ear normal.  Left Ear: External ear normal.  Nose: Nose normal.  Eyes: Right eye exhibits no discharge. Left eye exhibits no discharge.  Right pupil normal sized and reactive. Left eye with cataract  Neck: Neck supple.  Cardiovascular: Normal rate, regular rhythm and normal heart sounds.  Pulmonary/Chest: Accessory muscle usage present. Tachypnea noted. He has rhonchi.  Abdominal: Soft. He exhibits no distension. There is no tenderness.  Musculoskeletal: He exhibits edema (pitting edema to bilateral ankles and lower legs).  Neurological: He is unresponsive. GCS eye subscore is 1. GCS verbal subscore is 1. GCS motor subscore is 1.  Skin: Skin is warm and dry.  Nursing note and vitals reviewed.    ED Treatments / Results  Labs (all labs ordered are listed, but only abnormal results are displayed) Labs Reviewed  COMPREHENSIVE METABOLIC PANEL - Abnormal; Notable for the following components:      Result Value   CO2 21 (*)    BUN 25 (*)    Creatinine, Ser 2.05 (*)    Calcium 8.1 (*)    Total  Protein 4.6 (*)    Albumin 2.1 (*)    ALT 12 (*)    GFR calc non Af Amer 30 (*)    GFR calc Af Amer 35 (*)    All other components within normal limits  CBC WITH DIFFERENTIAL/PLATELET - Abnormal; Notable for the following components:   WBC 3.0 (*)    RBC 2.41 (*)    Hemoglobin 7.5 (*)    HCT 22.7 (*)    Lymphs Abs 0.3 (*)    All other components within normal  limits  BRAIN NATRIURETIC PEPTIDE - Abnormal; Notable for the following components:   B Natriuretic Peptide 436.5 (*)    All other components within normal limits  GLUCOSE, CAPILLARY - Abnormal; Notable for the following components:   Glucose-Capillary 134 (*)    All other components within normal limits  BLOOD GAS, ARTERIAL - Abnormal; Notable for the following components:   pH, Arterial 7.503 (*)    pCO2 arterial 24.9 (*)    pO2, Arterial 280 (*)    Acid-base deficit 3.2 (*)    All other components within normal limits  GLUCOSE, CAPILLARY - Abnormal; Notable for the following components:   Glucose-Capillary 123 (*)    All other components within normal limits  CBG MONITORING, ED - Abnormal; Notable for the following components:   Glucose-Capillary 138 (*)    All other components within normal limits  I-STAT CHEM 8, ED - Abnormal; Notable for the following components:   BUN 24 (*)    Creatinine, Ser 1.90 (*)    Calcium, Ion 1.07 (*)    Hemoglobin 9.9 (*)    HCT 29.0 (*)    All other components within normal limits  I-STAT CG4 LACTIC ACID, ED - Abnormal; Notable for the following components:   Lactic Acid, Venous 2.22 (*)    All other components within normal limits  I-STAT ARTERIAL BLOOD GAS, ED - Abnormal; Notable for the following components:   pH, Arterial 7.525 (*)    pCO2 arterial 24.6 (*)    pO2, Arterial 58.0 (*)    TCO2 21 (*)    All other components within normal limits  CULTURE, BLOOD (ROUTINE X 2)  CULTURE, BLOOD (ROUTINE X 2)  CULTURE, RESPIRATORY (NON-EXPECTORATED)  CULTURE, BLOOD (ROUTINE X 2)    CULTURE, BLOOD (ROUTINE X 2)  URINE CULTURE  MRSA PCR SCREENING  AMMONIA  ETHANOL  PROTIME-INR  TROPONIN I  URINALYSIS, COMPLETE (UACMP) WITH MICROSCOPIC  RAPID URINE DRUG SCREEN, HOSP PERFORMED  LACTIC ACID, PLASMA  CBC  CREATININE, SERUM  BRAIN NATRIURETIC PEPTIDE  CORTISOL  PROLACTIN  OCCULT BLOOD GASTRIC / DUODENUM (SPECIMEN CUP)  CBG MONITORING, ED    EKG  EKG Interpretation  Date/Time:  Saturday April 27 2017 11:28:00 EST Ventricular Rate:  106 PR Interval:    QRS Duration: 68 QT Interval:  347 QTC Calculation: 461 R Axis:   38 Text Interpretation:  Sinus tachycardia Multiform ventricular premature complexes Low voltage, extremity and precordial leads Probable anteroseptal infarct, old Nonspecific T abnormalities, lateral leads Confirmed by Sherwood Gambler [phone]) on 05/09/2017 1:25:04 PM       Radiology Ct Head Wo Contrast  Result Date: 05/07/2017 CLINICAL DATA:  Nonresponsive patient. EXAM: CT HEAD WITHOUT CONTRAST TECHNIQUE: Contiguous axial images were obtained from the base of the skull through the vertex without intravenous contrast. COMPARISON:  01/28/2016 FINDINGS: Brain: No evidence of acute infarction, hemorrhage, hydrocephalus, extra-axial collection or mass lesion/mass effect. Mild brain parenchymal volume loss and deep white matter microangiopathy. Vascular: Calcific atherosclerotic disease of the intracavernous carotid artery. Skull: Normal. Negative for fracture or focal lesion. Sinuses/Orbits: No acute finding. Other: None. IMPRESSION: No acute intracranial abnormality. Mild parenchymal atrophy and moderate chronic microvascular disease. Electronically Signed   By: Fidela Salisbury M.D.   On: 04/25/2017 13:23   Dg Chest Portable 1 View  Result Date: 04/15/2017 CLINICAL DATA:  Hypoxia EXAM: PORTABLE CHEST 1 VIEW COMPARISON:  Study obtained earlier in the day FINDINGS: Endotracheal tube tip is 3.6 cm above the carina. There is an  apparent chest tube  on the left. No pneumothorax. There has been resolution of consolidation from the left base medially. There is patchy airspace opacity throughout the right mid and lower lung zones. Heart size normal. Pulmonary vascularity normal. No adenopathy. IMPRESSION: Endotracheal tube tip 3.6 cm above carina. No pneumothorax. There is now hazy opacity throughout much of the right mid and lower lung zones. Question developing pneumonia or atypical edema. There has been interval clearing of presumed atelectasis from the medial left base. Heart size normal. Electronically Signed   By: Lowella Grip III M.D.   On: 04/14/2017 11:38   Dg Chest Port 1 View  Result Date: 04/14/2017 CLINICAL DATA:  Altered mental status, history of achalasia EXAM: PORTABLE CHEST 1 VIEW COMPARISON:  04/18/2017 FINDINGS: Mild increased vascular congestion and perihilar interstitial prominence suggesting mild volume overload or a developing edema. Stable heart size. No large effusion. No focal pneumonia, collapse or consolidation. Trachea is midline. Aorta is atherosclerotic. Bones are osteopenic. IMPRESSION: Increased central vascular congestion and interstitial prominence suspicious for volume overload or mild edema. Electronically Signed   By: Jerilynn Mages.  Shick M.D.   On: 04/28/2017 10:22    Procedures Procedure Name: Intubation Date/Time: 05/08/2017 10:51 AM Performed by: Sherwood Gambler, MD Pre-anesthesia Checklist: Emergency Drugs available, Suction available, Patient being monitored, Patient identified and Timeout performed Oxygen Delivery Method: Non-rebreather mask Preoxygenation: Pre-oxygenation with 100% oxygen Induction Type: Rapid sequence Ventilation: Mask ventilation without difficulty Laryngoscope Size: Glidescope and 3 Grade View: Grade I Tube size: 7.5 mm Number of attempts: 1 Airway Equipment and Method: Video-laryngoscopy Placement Confirmation: ETT inserted through vocal cords under direct vision and CO2  detector Secured at: 24 cm Tube secured with: ETT holder     .Critical Care Performed by: Sherwood Gambler, MD Authorized by: Sherwood Gambler, MD   Critical care provider statement:    Critical care time (minutes):  45   Critical care time was exclusive of:  Separately billable procedures and treating other patients   Critical care was necessary to treat or prevent imminent or life-threatening deterioration of the following conditions:  Respiratory failure, CNS failure or compromise and sepsis   Critical care was time spent personally by me on the following activities:  Development of treatment plan with patient or surrogate, discussions with consultants, evaluation of patient's response to treatment, examination of patient, obtaining history from patient or surrogate, ordering and performing treatments and interventions, ordering and review of laboratory studies, ordering and review of radiographic studies, pulse oximetry, re-evaluation of patient's condition, review of old charts and ventilator management   (including critical care time)  Medications Ordered in ED Medications  sodium chloride flush (NS) 0.9 % injection 3 mL (not administered)  sodium chloride flush (NS) 0.9 % injection 3 mL (not administered)  0.9 %  sodium chloride infusion (250 mLs Intravenous New Bag/Given 04/10/2017 1207)  propofol (DIPRIVAN) 1000 MG/100ML infusion (not administered)  vancomycin (VANCOCIN) 500 mg in sodium chloride 0.9 % 100 mL IVPB (not administered)  0.9 %  sodium chloride infusion (not administered)  aspirin chewable tablet 324 mg (not administered)    Or  aspirin suppository 300 mg (not administered)  famotidine (PEPCID) IVPB 20 mg premix (not administered)  acetaminophen (TYLENOL) tablet 650 mg (not administered)  piperacillin-tazobactam (ZOSYN) IVPB 3.375 g (not administered)  dextrose 5 %-0.9 % sodium chloride infusion ( Intravenous New Bag/Given 05/01/2017 1557)  naloxone (NARCAN) injection 2  mg (2 mg Intravenous Given 05/06/2017 1030)  dextrose 10 % infusion (0 mLs Intravenous  Stopped 04/15/2017 1204)  etomidate (AMIDATE) injection 20 mg (20 mg Intravenous Given 04/09/2017 1040)  rocuronium (ZEMURON) injection 54 mg (54 mg Intravenous Given 04/20/2017 1041)  propofol (DIPRIVAN) 1000 MG/100ML infusion (5 mcg/kg/min  54 kg Intravenous New Bag/Given 04/22/2017 1100)  piperacillin-tazobactam (ZOSYN) IVPB 3.375 g (0 g Intravenous Stopped 05/04/2017 1318)  vancomycin (VANCOCIN) IVPB 1000 mg/200 mL premix (1,000 mg Intravenous New Bag/Given 04/23/2017 1324)     Initial Impression / Assessment and Plan / ED Course  I have reviewed the triage vital signs and the nursing notes.  Pertinent labs & imaging results that were available during my care of the patient were reviewed by me and considered in my medical decision making (see chart for details).     Unclear why patient is still so altered and unresponsive.  His glucose has normalized but he still does not react to pain.  He was given Narcan but no response.  Given how tachypneic he is with requiring nasal cannula oxygen, decision made to intubate.  Family states he is a full code at this time.  He was intubated as above.  Repeat chest x-ray shows worsening right lower lobe concerning for pneumonia.  He was already put on broad-spectrum antibiotics.  This would likely be hospital acquired pneumonia.  Blood pressure has remained stable but he is critically ill and will need to go to the ICU.  Otherwise, no clear findings for altered mental status.  Final Clinical Impressions(s) / ED Diagnoses   Final diagnoses:  Acute respiratory failure with hypoxia ([place])  HCAP (healthcare-associated pneumonia)    ED Discharge Orders    None       Sherwood Gambler, MD 04/19/2017 1646

## 2017-04-27 NOTE — ED Notes (Signed)
No response from [facility]

## 2017-04-27 NOTE — Progress Notes (Signed)
Dewaine Oats, NP in department informed of pt cbg 71, D5NS increased to 88ml/hr per NP verbal order.

## 2017-04-27 NOTE — ED Notes (Signed)
CCM  At bedside,

## 2017-04-27 NOTE — Progress Notes (Signed)
Pharmacy Antibiotic Note  DURANT SCIBILIA is a 77 y.o. male admitted on 05/02/2017 with sepsis.  Pharmacy has been consulted for vancomycin dosing.  Patient found unresponsive at home, presented to ED via [place]. Given D10 by EMS with CBG of 39, repeat CBG 112. No response to narcan. WBC 3.0, LA 2.22, Temp 96.8. Renal function is consistent with baseline CKD3.   Plan: Vancomycin 1000 mg IV x 1 followed by 500 mg IV every 24 hours Monitor renal function, clinical status, culture results Vancomycin trough as indicated. Goal trough 15-20 mcg/mL.   Temp (24hrs), Avg:96.8 F (36 C), Min:96.8 F (36 C), Max:96.8 F (36 C)  Recent Labs  Lab 04/21/17 0650 04/22/17 0606 04/17/2017 1015 04/14/2017 1036  WBC 5.0 10.8* 3.0*  --   CREATININE 1.67* 1.80* 2.05* 1.90*  LATICACIDVEN  --   --   --  2.22*    Estimated Creatinine Clearance: 25.3 mL/min (A) (by C-G formula based on SCr of 1.9 mg/dL (H)).    No Known Allergies  Antimicrobials this admission: Zosyn 1/19 >>  Vancomycin 1/19 >>   Microbiology results: 1/19 BCx: pending   Thank you for allowing pharmacy to be a part of this patient's care.  Charlene Brooke, PharmD PGY1 Pharmacy Resident Phone: [phone] After 3:30PM please call [place] [phone] 04/11/2017 11:27 AM

## 2017-04-27 NOTE — ED Notes (Signed)
[facility] contacted to activate Code Sepsis 

## 2017-04-27 NOTE — ED Notes (Signed)
Help get patient into a gown on the monitor checked patient blood sugar it was 68 notified RN of blood sugar

## 2017-04-27 NOTE — ED Triage Notes (Signed)
To ED via [facility] from home with wife finding pt in bed unresponsive. Pt's CBG for EMS initially was 39-- received D10-- repeat CBG = 112-- pt on arrival is unresponsive -- does respond to painful stimuli with IV start.

## 2017-04-27 NOTE — H&P (Addendum)
[facility] Pulmonary Diseases & Critical Care Medicine HISTORY & PHYSICAL  Patient Name: [name] MRN: [id number] DOB: May 05, 1940    ADMISSION DATE:  04/22/2017 DATE OF SERVICE:  05/07/2017   CHIEF COMPLAINT:  altered mental status/unresponsive   HISTORY OF PRESENT ILLNESS  This 77 y.o. African American male presented to the ER with altered mental status. He was unresponsive but with pulse and respirations. Due to concerns for inability to protect his airway, he was intubated in the ER. The patient's last known well time was last night when he turned in to sleep. The patient's wife is at the bedside and is able to provide some additional helpful history. She believes that the patient was "alrigth" last night, but she went to sleep first. She notes that the patient has been waking up later and later in the mornings and was determined to make him wake up to eat breakfast today. She tried to wake him up at around 0900 but to no avail. She called EMS.  Of note, the patient is known to be diabetic, for which he takes PO diabetic medications. He has not been eating well. He has a history of achalasia and underwent esophageal dilatation around 04/22/2017 but apparently has continued to have modest dietary intake (he is typically limited to a liquid diet due to achalasia). His glucose in the ER was 39. He was given D50 with appropriate increase in his glucose level but his mental status has not improved.  This patient is critically ill and cannot provide additional history due to Unconsciousness and Ventilated.  REVIEW OF SYSTEMS unable to obtain due to patient's altered mental status and intubated state.  PAST MEDICAL/SURGICAL/SOCIAL/FAMILY HISTORIES   Past Medical History:  Diagnosis Date  . Anemia    IRON TRANSFUSION 1 MONTH AGO  . Blood clot in vein 2008   LEFT LEG  . Cataract    LEFT  . CKD (chronic kidney disease) 02/2016  . Diabetes mellitus, type 2 ([place]) 2008  . GERD  (gastroesophageal reflux disease)   . Hyperlipidemia 2008  . Hypertension 2008  . Pneumonia 01/18/2016  . Prostate cancer ([place]) 2006   IMPLANT IN LEFT ARM FOR [place]    Past Surgical History:  Procedure Laterality Date  . BALLOON DILATION N/A 04/13/2016   Procedure: BALLOON DILATION;  Surgeon: Gatha Mayer, MD;  Location: [facility] ENDOSCOPY;  Service: Endoscopy;  Laterality: N/A;  . BALLOON DILATION N/A 04/22/2017   Procedure: PNEUMATIC BALLOON DILATION WITH FLORO;  Surgeon: Mauri Pole, MD;  Location: [facility] ENDOSCOPY;  Service: Endoscopy;  Laterality: N/A;  . BOTOX INJECTION N/A 04/13/2016   Procedure: BOTOX INJECTION;  Surgeon: Gatha Mayer, MD;  Location: [place];  Service: Endoscopy;  Laterality: N/A;  . BOTOX INJECTION N/A 01/02/2017   Procedure: BOTOX INJECTION;  Surgeon: Yetta Flock, MD;  Location: [facility] ENDOSCOPY;  Service: Gastroenterology;  Laterality: N/A;  . COLONOSCOPY W/ POLYPECTOMY  03/2008   diminutive rectal polyp (path: prolapse type polyp, not adenoma).  moderate pan-diverticulitis.   Marland Kitchen ESOPHAGEAL MANOMETRY  1984   unable to pass manometry catheter beyond LES. Aperiystalsis of esophagus: sugg of achalasia.   Marland Kitchen ESOPHAGOGASTRODUODENOSCOPY N/A 02/17/2016   Procedure: ESOPHAGOGASTRODUODENOSCOPY (EGD);  Surgeon: [name], MD;  Location: [facility];  Service: Endoscopy;  Laterality: N/A;  . ESOPHAGOGASTRODUODENOSCOPY N/A 04/22/2017   Procedure: ESOPHAGOGASTRODUODENOSCOPY (EGD);  Surgeon: Mauri Pole, MD;  Location: [facility] ENDOSCOPY;  Service: Endoscopy;  Laterality: N/A;  . ESOPHAGOGASTRODUODENOSCOPY (EGD) WITH PROPOFOL N/A  02/21/2016   Procedure: ESOPHAGOGASTRODUODENOSCOPY (EGD) WITH PROPOFOL;  Surgeon: Jerene Bears, MD;  Location: [facility] ENDOSCOPY;  Service: Gastroenterology;  Laterality: N/A;  . ESOPHAGOGASTRODUODENOSCOPY (EGD) WITH PROPOFOL N/A 02/28/2016   Procedure: ESOPHAGOGASTRODUODENOSCOPY (EGD) WITH PROPOFOL withBOTOX;  Surgeon: Manus Gunning,  MD;  Location: [facility] ENDOSCOPY;  Service: Gastroenterology;  Laterality: N/A;  . ESOPHAGOGASTRODUODENOSCOPY (EGD) WITH PROPOFOL N/A 04/13/2016   Procedure: ESOPHAGOGASTRODUODENOSCOPY (EGD) WITH PROPOFOL;  Surgeon: Gatha Mayer, MD;  Location: [place];  Service: Endoscopy;  Laterality: N/A;  . ESOPHAGOGASTRODUODENOSCOPY (EGD) WITH PROPOFOL N/A 01/02/2017   Procedure: ESOPHAGOGASTRODUODENOSCOPY (EGD) WITH PROPOFOL;  Surgeon: Yetta Flock, MD;  Location: [facility] ENDOSCOPY;  Service: Gastroenterology;  Laterality: N/A;  . LEFT ARM IMPLANT X2     RADIATION TX ALSO    Social History   Tobacco Use  . Smoking status: Former Smoker    Years: 10.00    Last attempt to quit: 2000    Years since quitting: 19.0  . Smokeless tobacco: Never Used  Substance Use Topics  . Alcohol use: No    Family History  Problem Relation Age of Onset  . Diabetes Father   . Diabetes Sister   . Hypertension Sister   . Hypertension Brother   . Heart disease Neg Hx   . Colon cancer Neg Hx   . Stomach cancer Neg Hx      No Known Allergies   Prior to Admission medications   Medication Sig Start Date End Date Taking? Authorizing Provider  ACCU-CHEK FASTCLIX LANCETS MISC Use to check blood sugars twice daily E11.65 11/08/16   Elayne Snare, MD  ACCU-CHEK FASTCLIX LANCETS MISC USE TO CHECK BLOOD SUGARS TWICE DAILY E11.65 02/11/17   Elayne Snare, MD  Amino Acids-Protein Hydrolys (FEEDING SUPPLEMENT, PRO-STAT SUGAR FREE 64,) LIQD Take 30 mLs by mouth 2 (two) times daily. Patient taking differently: Take 30 mLs by mouth daily.  04/13/16   Dessa Phi, DO  atorvastatin (LIPITOR) 10 MG tablet Take 1 tablet (10 mg total) by mouth at bedtime. 07/09/16   Elayne Snare, MD  docusate sodium (COLACE) 100 MG capsule TAKE ONE CAPSULE BY MOUTH TWICE A DAY FOR CONSTIPATION 07/04/16   Elayne Snare, MD  enzalutamide Gillermina Phy) 40 MG capsule Take 160 mg by mouth daily.    [provider]  feeding supplement, ENSURE ENLIVE, (ENSURE  ENLIVE) LIQD Take 237 mLs by mouth 2 (two) times daily between meals. 01/26/17   Florencia Reasons, MD  ferrous sulfate 325 (65 FE) MG tablet TAKE 1 TABLET BY MOUTH EVERY DAY FOR SUPPLEMENT 02/20/17   Elayne Snare, MD  furosemide (LASIX) 80 MG tablet Take 0.5 tablets (40 mg total) by mouth daily. 04/23/17   Caren Griffins, MD  glipiZIDE (GLUCOTROL XL) 5 MG 24 hr tablet Take 5 mg by mouth daily with breakfast.    [provider]  glucose blood test strip Use as instructed 11/08/16   Elayne Snare, MD  metoprolol tartrate (LOPRESSOR) 25 MG tablet Take 0.5 tablets (12.5 mg total) by mouth 2 (two) times daily. 04/11/17   Lance Sell, NP  Multiple Vitamin (MULTIVITAMIN WITH MINERALS) TABS tablet Take 1 tablet by mouth daily.    [provider]  ondansetron (ZOFRAN-ODT) 4 MG disintegrating tablet Take 1 tablet (4 mg total) by mouth every 8 (eight) hours as needed for nausea or vomiting. 01/18/17   Golden Circle, FNP  pantoprazole (PROTONIX) 40 MG tablet TAKE 1 TABLET TWICE A DAY 02/14/17   Elayne Snare, MD  pioglitazone (ACTOS) 15 MG tablet Take 1 tablet (15 mg total) daily by mouth. 02/12/17   Elayne Snare, MD  TRADJENTA 5 MG TABS tablet TAKE 1 TABLET (5 MG TOTAL) BY MOUTH DAILY. 04/15/17   Elayne Snare, MD    Current Facility-Administered Medications  Medication Dose Route Frequency Provider Last Rate Last Dose  . 0.9 %  sodium chloride infusion  250 mL Intravenous PRN Sherwood Gambler, MD      . etomidate (AMIDATE) injection 20 mg  20 mg Intravenous Once Sherwood Gambler, MD      . piperacillin-tazobactam (ZOSYN) IVPB 3.375 g  3.375 g Intravenous Once Sherwood Gambler, MD      . propofol (DIPRIVAN) 1000 MG/100ML infusion  5-80 mcg/kg/min Intravenous Once Sherwood Gambler, MD      . propofol (DIPRIVAN) 1000 MG/100ML infusion           . rocuronium (ZEMURON) injection 54 mg  1 mg/kg Intravenous Once Sherwood Gambler, MD      . sodium chloride flush (NS) 0.9 % injection 3 mL  3 mL Intravenous  Q12H Sherwood Gambler, MD      . sodium chloride flush (NS) 0.9 % injection 3 mL  3 mL Intravenous PRN Sherwood Gambler, MD      . Derrill Memo ON 04/28/2017] vancomycin (VANCOCIN) 500 mg in sodium chloride 0.9 % 100 mL IVPB  500 mg Intravenous Q24H Charlton Haws, [place]      . vancomycin (VANCOCIN) IVPB 1000 mg/200 mL premix  1,000 mg Intravenous Once Charlton Haws, [facility]       Current Outpatient Medications  Medication Sig Dispense Refill  . ACCU-CHEK FASTCLIX LANCETS MISC Use to check blood sugars twice daily E11.65 180 each 0  . ACCU-CHEK FASTCLIX LANCETS MISC USE TO CHECK BLOOD SUGARS TWICE DAILY E11.65 204 each 0  . Amino Acids-Protein Hydrolys (FEEDING SUPPLEMENT, PRO-STAT SUGAR FREE 64,) LIQD Take 30 mLs by mouth 2 (two) times daily. (Patient taking differently: Take 30 mLs by mouth daily. ) 900 mL 0  . atorvastatin (LIPITOR) 10 MG tablet Take 1 tablet (10 mg total) by mouth at bedtime. 90 tablet 1  . docusate sodium (COLACE) 100 MG capsule TAKE ONE CAPSULE BY MOUTH TWICE A DAY FOR CONSTIPATION 60 capsule 0  . enzalutamide (XTANDI) 40 MG capsule Take 160 mg by mouth daily.    . feeding supplement, ENSURE ENLIVE, (ENSURE ENLIVE) LIQD Take 237 mLs by mouth 2 (two) times daily between meals. 237 mL 12  . ferrous sulfate 325 (65 FE) MG tablet TAKE 1 TABLET BY MOUTH EVERY DAY FOR SUPPLEMENT 90 tablet 0  . furosemide (LASIX) 80 MG tablet Take 0.5 tablets (40 mg total) by mouth daily. 30 tablet 0  . glipiZIDE (GLUCOTROL XL) 5 MG 24 hr tablet Take 5 mg by mouth daily with breakfast.    . glucose blood test strip Use as instructed 100 each 12  . metoprolol tartrate (LOPRESSOR) 25 MG tablet Take 0.5 tablets (12.5 mg total) by mouth 2 (two) times daily. 60 tablet 5  . Multiple Vitamin (MULTIVITAMIN WITH MINERALS) TABS tablet Take 1 tablet by mouth daily.    . ondansetron (ZOFRAN-ODT) 4 MG disintegrating tablet Take 1 tablet (4 mg total) by mouth every 8 (eight) hours as needed for nausea or  vomiting. 20 tablet 0  . pantoprazole (PROTONIX) 40 MG tablet TAKE 1 TABLET TWICE A DAY 180 tablet 1  . pioglitazone (ACTOS) 15 MG tablet Take 1 tablet (15 mg total) daily by mouth. [place]  tablet 2  . TRADJENTA 5 MG TABS tablet TAKE 1 TABLET (5 MG TOTAL) BY MOUTH DAILY. 90 tablet 0     VITAL SIGNS: BP (!) 148/9   Pulse 99   Temp (!) 96.8 F (36 C) (Rectal)   Resp (!) 32   SpO2 99%   HEMODYNAMICS:    VENTILATOR SETTINGS: Vent Mode: PRVC FiO2 (%):  [40 %] 40 % Set Rate:  [14 bmp] 14 bmp Vt Set:  [500 mL] 500 mL PEEP:  [5 cmH20] 5 cmH20 Plateau Pressure:  [15 cmH20] 15 cmH20  INTAKE / OUTPUT: No intake/output data recorded.  PHYSICAL EXAMINATION: General:  Comatose. Intubated. No response to noxious stimuli. Head: normocephalic, atraumatic EYE: dense OS cataract. R pupils reactive. Doll's eyes intact. Nose: nares are patent. Throat/Oral Cavity: ETT in situ. OGT in situ. Red (bloody?) material in OGT lumen. Neck: supple, no thyromegaly, no JVD, no lymphadenopathy. Trachea midline. Chest/Lung: symmetric in development and expansion. Good air entry. Scattered coarse crackles and rhonchi. Heart: Regular S1 and S2 without murmur, rub or gallop. Abdomen: soft, nontender, nondistended. Normoactive bowel sounds. No rebound. No guarding. Extremities: RLE appears larger in girth than LLE without obvious swelling. no cyanosis, no clubbing. 2+ DP pulses Lymphatic: no cervical/axiallary/inguinal lymph nodes appreciated Skin:  No rash or lesion. NEURO: cranial nerves II-XII are grossly symmetric and physiologic. Babinski absent. No sensory deficit. No motor deficit. DTR 2+ @ RUE, 2+ @ LUE 3+ @ RLL,  1+ @ LLL. No cerebellar signs. Gait was not assessed.   LABS:  BMET Recent Labs  Lab 04/21/17 0650 04/22/17 0606 04/21/2017 1015 05/07/2017 1036  NA 140 139 139 138  K 4.2 4.6 4.2 4.1  CL 109 106 105 104  CO2 25 24 21*  --   BUN 32* 31* 25* 24*  CREATININE 1.67* 1.80* 2.05* 1.90*   GLUCOSE 89 130* 83 83    Electrolytes Recent Labs  Lab 04/21/17 0650 04/22/17 0606 05/04/2017 1015  CALCIUM 8.2* 8.5* 8.1*    CBC Recent Labs  Lab 04/21/17 0650 04/22/17 0606 04/24/2017 1015 05/04/2017 1036  WBC 5.0 10.8* 3.0*  --   HGB 7.1* 8.5* 7.5* 9.9*  HCT 21.2* 25.1* 22.7* 29.0*  PLT 176 197 261  --     Coag's Recent Labs  Lab 04/22/17 0619 04/24/2017 1015  INR 0.97 0.93    Sepsis Markers Recent Labs  Lab 04/24/2017 1036  LATICACIDVEN 2.22*    ABG Recent Labs  Lab 04/24/2017 1015  PHART 7.525*  PCO2ART 24.6*  PO2ART 58.0*    Liver Enzymes Recent Labs  Lab 04/11/2017 1015  AST 28  ALT 12*  ALKPHOS 83  BILITOT 0.7  ALBUMIN 2.1*    Cardiac Enzymes Recent Labs  Lab 04/11/2017 1015  TROPONINI <0.03    Glucose Recent Labs  Lab 04/23/17 0438 04/23/17 0507 04/23/17 0719 04/23/17 1138 04/21/2017 1006 04/23/2017 1124  GLUCAP 51* 61* 87 88 68 138*    Imaging Dg Chest Portable 1 View  Result Date: 04/14/2017 CLINICAL DATA:  Hypoxia EXAM: PORTABLE CHEST 1 VIEW COMPARISON:  Study obtained earlier in the day FINDINGS: Endotracheal tube tip is 3.6 cm above the carina. There is an apparent chest tube on the left. No pneumothorax. There has been resolution of consolidation from the left base medially. There is patchy airspace opacity throughout the right mid and lower lung zones. Heart size normal. Pulmonary vascularity normal. No adenopathy. IMPRESSION: Endotracheal tube tip 3.6 cm above carina. No pneumothorax. There is now hazy  opacity throughout much of the right mid and lower lung zones. Question developing pneumonia or atypical edema. There has been interval clearing of presumed atelectasis from the medial left base. Heart size normal. Electronically Signed   By: Lowella Grip III M.D.   On: 04/22/2017 11:38   Dg Chest Port 1 View  Result Date: 04/19/2017 CLINICAL DATA:  Altered mental status, history of achalasia EXAM: PORTABLE CHEST 1 VIEW  COMPARISON:  04/18/2017 FINDINGS: Mild increased vascular congestion and perihilar interstitial prominence suggesting mild volume overload or a developing edema. Stable heart size. No large effusion. No focal pneumonia, collapse or consolidation. Trachea is midline. Aorta is atherosclerotic. Bones are osteopenic. IMPRESSION: Increased central vascular congestion and interstitial prominence suspicious for volume overload or mild edema. Electronically Signed   By: Jerilynn Mages.  Shick M.D.   On: 04/30/2017 10:22     STUDIES:  -  CULTURES: Results for orders placed or performed during the hospital encounter of 01/21/17  Culture, blood (Routine X 2) w Reflex to ID Panel     Status: None   Collection Time: 01/21/17 11:48 PM  Result Value Ref Range Status   Specimen Description BLOOD RIGHT HAND  Final   Special Requests IN PEDIATRIC BOTTLE Blood Culture adequate volume  Final   Culture   Final    NO GROWTH 5 DAYS Performed at [facility], [place] [address], [place], [place] [address]    Report Status 01/27/2017 FINAL  Final  Culture, blood (Routine X 2) w Reflex to ID Panel     Status: None   Collection Time: 01/22/17  2:36 AM  Result Value Ref Range Status   Specimen Description BLOOD RIGHT HAND  Final   Special Requests IN PEDIATRIC BOTTLE Blood Culture adequate volume  Final   Culture   Final    NO GROWTH 5 DAYS Performed at [facility], [place] [address], [place], [place] [address]    Report Status 01/27/2017 FINAL  Final    ANTIBIOTICS: Zosyn 1/19 >> Vancomycin 1/19 >>  SIGNIFICANT EVENTS: 1/19 found comatose at home --> ER --> intubated  LINES/TUBES: ETT 1/19   ASSESSMENT / PLAN: Active Problems:   Hypoglycemia   Acute metabolic encephalopathy   Coma (HCC)   Normocytic anemia   Acute hypoxemic respiratory failure (HCC)   Leukopenia   CKD (chronic kidney disease) stage 3, GFR 30-59 ml/min (HCC)   Type 2 diabetes mellitus with renal manifestations ([place])   Admit to  ICU Agree with getting head CT Titrate vent settings based on ABG results. No sedation for now to allow for neurologic monitoring. U/S R LE ([id number]) to R/O DVT Gastroccult. Empiric antibiotics for possible aspiration pneumonia: Zosyn/vancomycin IV fluids: D5NS @ 50 mL/hr Accuchecks q 1 hr. In light of hypoglycemic presentation, I will NOT start sliding scale insulin until he demonstrates consistent hyperglycemia Will check prolactin, as this may be a postictal state. CBC, CMP, Mg, ABG, CXR in am  DVT PROPHYLAXIS: heparin if CT negative for hemorrhage AND Gastroccult negative GI PROPHYLAXIS: famotidine   FAMILY  - Updates: wife at bedside.  - Inter-disciplinary family meet or Palliative Care meeting due by:  day 7   Renee Pain, MD Board Certified by the ABIM, [place] Pager: [phone]  04/28/2017, 11:55 AM  CC: 60 min

## 2017-04-27 NOTE — Progress Notes (Signed)
CRITICAL VALUE ALERT  Critical Value: Lactic Acid 4.5  Date & Time Notied:  04/15/2017 6:15pm  Provider Notified: Virginia Crews, MD notified   Orders Received/Actions taken: No new orders at this time, will continue to monitor.

## 2017-04-28 ENCOUNTER — Inpatient Hospital Stay (HOSPITAL_COMMUNITY): Payer: Medicare Other

## 2017-04-28 DIAGNOSIS — J9601 Acute respiratory failure with hypoxia: Secondary | ICD-10-CM

## 2017-04-28 LAB — MAGNESIUM
MAGNESIUM: 1.4 mg/dL — AB (ref 1.7–2.4)
MAGNESIUM: 1.3 mg/dL — AB (ref 1.7–2.4)
Magnesium: 1.7 mg/dL (ref 1.7–2.4)

## 2017-04-28 LAB — BASIC METABOLIC PANEL
Anion gap: 14 (ref 5–15)
BUN: 27 mg/dL — AB (ref 6–20)
CHLORIDE: 108 mmol/L (ref 101–111)
CO2: 17 mmol/L — ABNORMAL LOW (ref 22–32)
CREATININE: 2.08 mg/dL — AB (ref 0.61–1.24)
Calcium: 7.8 mg/dL — ABNORMAL LOW (ref 8.9–10.3)
GFR calc Af Amer: 34 mL/min — ABNORMAL LOW (ref >60)
GFR, EST NON AFRICAN AMERICAN: 29 mL/min — AB (ref >60)
GLUCOSE: 85 mg/dL (ref 65–99)
Potassium: 4.3 mmol/L (ref 3.5–5.1)
SODIUM: 139 mmol/L (ref 135–145)

## 2017-04-28 LAB — CBC
HCT: 24.6 % — ABNORMAL LOW (ref 39.0–52.0)
Hemoglobin: 8.2 g/dL — ABNORMAL LOW (ref 13.0–17.0)
MCH: 31.2 pg (ref 26.0–34.0)
MCHC: 33.3 g/dL (ref 30.0–36.0)
MCV: 93.5 fL (ref 78.0–100.0)
PLATELETS: 189 10*3/uL (ref 150–400)
RBC: 2.63 MIL/uL — ABNORMAL LOW (ref 4.22–5.81)
RDW: 13.9 % (ref 11.5–15.5)
WBC: 8.6 10*3/uL (ref 4.0–10.5)

## 2017-04-28 LAB — BLOOD GAS, ARTERIAL
Acid-base deficit: 3.1 mmol/L — ABNORMAL HIGH (ref 0.0–2.0)
Bicarbonate: 20 mmol/L (ref 20.0–28.0)
DRAWN BY: 330991
FIO2: 40
MECHVT: 500 mL
O2 SAT: 97.4 %
PATIENT TEMPERATURE: 98.6
PCO2 ART: 27.8 mmHg — AB (ref 32.0–48.0)
PEEP: 5 cmH2O
PH ART: 7.471 — AB (ref 7.350–7.450)
RATE: 14 resp/min
pO2, Arterial: 98.8 mmHg (ref 83.0–108.0)

## 2017-04-28 LAB — ABO/RH: ABO/RH(D): B POS

## 2017-04-28 LAB — PHOSPHORUS
PHOSPHORUS: 4.2 mg/dL (ref 2.5–4.6)
Phosphorus: 4.5 mg/dL (ref 2.5–4.6)
Phosphorus: 4.3 mg/dL (ref 2.5–4.6)

## 2017-04-28 LAB — PROLACTIN: Prolactin: 62.7 ng/mL — ABNORMAL HIGH (ref 4.0–15.2)

## 2017-04-28 LAB — GLUCOSE, CAPILLARY
GLUCOSE-CAPILLARY: 77 mg/dL (ref 65–99)
GLUCOSE-CAPILLARY: 76 mg/dL (ref 65–99)
GLUCOSE-CAPILLARY: 59 mg/dL — AB (ref 65–99)
GLUCOSE-CAPILLARY: 124 mg/dL — AB (ref 65–99)
GLUCOSE-CAPILLARY: 124 mg/dL — AB (ref 65–99)
Glucose-Capillary: 103 mg/dL — ABNORMAL HIGH (ref 65–99)
Glucose-Capillary: 108 mg/dL — ABNORMAL HIGH (ref 65–99)
Glucose-Capillary: 126 mg/dL — ABNORMAL HIGH (ref 65–99)
Glucose-Capillary: 151 mg/dL — ABNORMAL HIGH (ref 65–99)
Glucose-Capillary: 71 mg/dL (ref 65–99)
Glucose-Capillary: 79 mg/dL (ref 65–99)
Glucose-Capillary: 87 mg/dL (ref 65–99)

## 2017-04-28 LAB — LACTIC ACID, PLASMA
LACTIC ACID, VENOUS: 4.6 mmol/L — AB (ref 0.5–1.9)
LACTIC ACID, VENOUS: 4 mmol/L — AB (ref 0.5–1.9)
LACTIC ACID, VENOUS: 3 mmol/L — AB (ref 0.5–1.9)
LACTIC ACID, VENOUS: 3.3 mmol/L — AB (ref 0.5–1.9)
LACTIC ACID, VENOUS: 2.5 mmol/L — AB (ref 0.5–1.9)
LACTIC ACID, VENOUS: 2.7 mmol/L — AB (ref 0.5–1.9)

## 2017-04-28 MED ORDER — HEPARIN SODIUM (PORCINE) 5000 UNIT/ML IJ SOLN
5000.0000 [IU] | Freq: Three times a day (TID) | INTRAMUSCULAR | Status: DC
  Administered 2017-04-28 – 2017-04-30 (×6): 5000 [IU] via SUBCUTANEOUS
  Filled 2017-04-28 (×7): qty 1

## 2017-04-28 MED ORDER — PRO-STAT SUGAR FREE PO LIQD
30.0000 mL | Freq: Two times a day (BID) | ORAL | Status: DC
  Administered 2017-04-28 – 2017-04-29 (×3): 30 mL
  Filled 2017-04-28 (×3): qty 30

## 2017-04-28 MED ORDER — SODIUM CHLORIDE 0.9 % IV BOLUS (SEPSIS)
1000.0000 mL | Freq: Once | INTRAVENOUS | Status: AC
  Administered 2017-04-28: 1000 mL via INTRAVENOUS

## 2017-04-28 MED ORDER — MAGNESIUM SULFATE 2 GM/50ML IV SOLN
2.0000 g | Freq: Once | INTRAVENOUS | Status: AC
  Administered 2017-04-28: 2 g via INTRAVENOUS
  Filled 2017-04-28: qty 50

## 2017-04-28 MED ORDER — SODIUM CHLORIDE 0.9 % IV BOLUS (SEPSIS)
1000.0000 mL | Freq: Once | INTRAVENOUS | Status: AC
Start: 2017-04-28 — End: 2017-04-28
  Administered 2017-04-28: 1000 mL via INTRAVENOUS

## 2017-04-28 MED ORDER — VALPROATE SODIUM 500 MG/5ML IV SOLN
1300.0000 mg | Freq: Once | INTRAVENOUS | Status: AC
  Administered 2017-04-29: 1300 mg via INTRAVENOUS
  Filled 2017-04-28: qty 13

## 2017-04-28 MED ORDER — CHLORHEXIDINE GLUCONATE 0.12% ORAL RINSE (MEDLINE KIT)
15.0000 mL | Freq: Two times a day (BID) | OROMUCOSAL | Status: DC
  Administered 2017-04-28 – 2017-05-03 (×11): 15 mL via OROMUCOSAL

## 2017-04-28 MED ORDER — PHENYLEPHRINE HCL 10 MG/ML IJ SOLN
0.0000 ug/min | INTRAMUSCULAR | Status: DC
  Filled 2017-04-28 (×3): qty 1

## 2017-04-28 MED ORDER — INSULIN ASPART 100 UNIT/ML ~~LOC~~ SOLN
0.0000 [IU] | SUBCUTANEOUS | Status: DC
Start: 2017-04-28 — End: 2017-05-03
  Administered 2017-04-28: 2 [IU] via SUBCUTANEOUS
  Administered 2017-04-28 – 2017-04-30 (×5): 1 [IU] via SUBCUTANEOUS
  Administered 2017-04-30 (×2): 2 [IU] via SUBCUTANEOUS
  Administered 2017-04-30: 1 [IU] via SUBCUTANEOUS
  Administered 2017-05-01 (×3): 2 [IU] via SUBCUTANEOUS
  Administered 2017-05-02 (×2): 1 [IU] via SUBCUTANEOUS
  Administered 2017-05-02 (×2): 2 [IU] via SUBCUTANEOUS
  Administered 2017-05-02 – 2017-05-03 (×3): 1 [IU] via SUBCUTANEOUS
  Administered 2017-05-03: 2 [IU] via SUBCUTANEOUS

## 2017-04-28 MED ORDER — MIDAZOLAM HCL 2 MG/2ML IJ SOLN
2.0000 mg | INTRAMUSCULAR | Status: DC | PRN
  Administered 2017-04-28 – 2017-04-30 (×3): 2 mg via INTRAVENOUS
  Filled 2017-04-28 (×4): qty 2

## 2017-04-28 MED ORDER — MAGNESIUM SULFATE 2 GM/50ML IV SOLN: 2.0000 g | Freq: Once | INTRAVENOUS | Status: DC

## 2017-04-28 MED ORDER — SODIUM CHLORIDE 0.9 % IV BOLUS (SEPSIS)
1000.0000 mL | Freq: Once | INTRAVENOUS | Status: AC
  Administered 2017-04-28: 200 mL via INTRAVENOUS
  Administered 2017-04-28: 500 mL via INTRAVENOUS
  Administered 2017-04-28: 300 mL via INTRAVENOUS

## 2017-04-28 MED ORDER — VITAL HIGH PROTEIN PO LIQD
1000.0000 mL | ORAL | Status: DC
  Administered 2017-04-28: 1000 mL

## 2017-04-28 MED ORDER — ORAL CARE MOUTH RINSE
15.0000 mL | Freq: Four times a day (QID) | OROMUCOSAL | Status: DC
  Administered 2017-04-28 – 2017-05-03 (×21): 15 mL via OROMUCOSAL

## 2017-04-28 MED ORDER — DEXTROSE 50 % IV SOLN
INTRAVENOUS | Status: AC
  Administered 2017-04-28: 25 mL
  Filled 2017-04-28: qty 50

## 2017-04-28 MED ORDER — VALPROATE SODIUM 500 MG/5ML IV SOLN
300.0000 mg | Freq: Three times a day (TID) | INTRAVENOUS | Status: DC
  Administered 2017-04-29 – 2017-05-03 (×13): 300 mg via INTRAVENOUS
  Filled 2017-04-28 (×14): qty 3

## 2017-04-28 NOTE — Progress Notes (Signed)
Pt continue to have intermittent twitches of feet. Opens eyes, but still does not follow commands.  LA is slowly dropping, MD is aware. 1L Bolus of NS given to support low  BP. Family at the bedside, education given, updated and given consent to place a central line.  Bed in low position, alarms are on, call bell in reach.

## 2017-04-28 NOTE — Progress Notes (Signed)
PULMONARY / CRITICAL CARE MEDICINE   Name: [name] MRN: [id number] DOB: 1941/03/14    ADMISSION DATE:  04/29/2017 CONSULTATION DATE:  1/19  REFERRING MD:  ER  CHIEF COMPLAINT:  AMS  HISTORY OF PRESENT ILLNESS:    This 77 y.o. African American male presented to the ER with altered mental status. He was unresponsive but with pulse and respirations. Due to concerns for inability to protect his airway, he was intubated in the ER. The patient's last known well time was last night when he turned in to sleep. The patient's wife is at the bedside and is able to provide some additional helpful history. She believes that the patient was "alrigth" last night, but she went to sleep first. She notes that the patient has been waking up later and later in the mornings and was determined to make him wake up to eat breakfast today. She tried to wake him up at around 0900 but to no avail. She called EMS.  Of note, the patient is known to be diabetic, for which he takes PO diabetic medications. He has not been eating well. He has a history of achalasia and underwent esophageal dilatation around 04/22/2017 but apparently has continued to have modest dietary intake (he is typically limited to a liquid diet due to achalasia). His glucose in the ER was 39. He was given D50 with appropriate increase in his glucose level but his mental status has not improved.  This patient is critically ill and cannot provide additional history due to Unconsciousness and Ventilated.    PAST MEDICAL HISTORY :  He  has a past medical history of Anemia, Blood clot in vein (2008), Cataract, CKD (chronic kidney disease) (02/2016), Diabetes mellitus, type 2 ([place]) (2008), GERD (gastroesophageal reflux disease), Hyperlipidemia (2008), Hypertension (2008), Pneumonia (01/18/2016), and Prostate cancer ([place]) (2006).  PAST SURGICAL HISTORY: He  has a past surgical history that includes Colonoscopy w/ polypectomy (03/2008); Esophageal  manometry (1984); Esophagogastroduodenoscopy (N/A, 02/17/2016); Esophagogastroduodenoscopy (egd) with propofol (N/A, 02/21/2016); Esophagogastroduodenoscopy (egd) with propofol (N/A, 02/28/2016); Esophagogastroduodenoscopy (egd) with propofol (N/A, 04/13/2016); Botox injection (N/A, 04/13/2016); Balloon dilation (N/A, 04/13/2016); LEFT ARM IMPLANT X2; Esophagogastroduodenoscopy (egd) with propofol (N/A, 01/02/2017); Botox injection (N/A, 01/02/2017); Esophagogastroduodenoscopy (N/A, 04/22/2017); and Balloon dilation (N/A, 04/22/2017).  No Known Allergies  No current facility-administered medications on file prior to encounter.    Current Outpatient Medications on File Prior to Encounter  Medication Sig  . Amino Acids-Protein Hydrolys (FEEDING SUPPLEMENT, PRO-STAT SUGAR FREE 64,) LIQD Take 30 mLs by mouth 2 (two) times daily. (Patient taking differently: Take 30 mLs by mouth daily. )  . atorvastatin (LIPITOR) 10 MG tablet Take 1 tablet (10 mg total) by mouth at bedtime.  . docusate sodium (COLACE) 100 MG capsule TAKE ONE CAPSULE BY MOUTH TWICE A DAY FOR CONSTIPATION (Patient taking differently: TAKE ONE CAPSULE (10 MG) BY MOUTH TWICE A DAY FOR CONSTIPATION)  . enzalutamide (XTANDI) 40 MG capsule Take 160 mg by mouth daily.  . feeding supplement, ENSURE ENLIVE, (ENSURE ENLIVE) LIQD Take 237 mLs by mouth 2 (two) times daily between meals.  . ferrous sulfate 325 (65 FE) MG tablet TAKE 1 TABLET BY MOUTH EVERY DAY FOR SUPPLEMENT (Patient taking differently: 325 mg. TAKE 1 TABLET BY MOUTH EVERY DAY FOR SUPPLEMENT)  . furosemide (LASIX) 80 MG tablet Take 0.5 tablets (40 mg total) by mouth daily.  Marland Kitchen glipiZIDE (GLUCOTROL XL) 5 MG 24 hr tablet Take 5 mg by mouth daily with breakfast.  . metoprolol tartrate (LOPRESSOR)  25 MG tablet Take 0.5 tablets (12.5 mg total) by mouth 2 (two) times daily.  . Multiple Vitamin (MULTIVITAMIN WITH MINERALS) TABS tablet Take 1 tablet by mouth daily.  . ondansetron (ZOFRAN-ODT) 4 MG  disintegrating tablet Take 1 tablet (4 mg total) by mouth every 8 (eight) hours as needed for nausea or vomiting.  . pantoprazole (PROTONIX) 40 MG tablet TAKE 1 TABLET TWICE A DAY (Patient taking differently: TAKE 1 TABLET (40 MG) TWICE A DAY)  . pioglitazone (ACTOS) 15 MG tablet Take 1 tablet (15 mg total) daily by mouth.  . TRADJENTA 5 MG TABS tablet TAKE 1 TABLET (5 MG TOTAL) BY MOUTH DAILY.  Marland Kitchen ACCU-CHEK FASTCLIX LANCETS MISC Use to check blood sugars twice daily E11.65  . ACCU-CHEK FASTCLIX LANCETS MISC USE TO CHECK BLOOD SUGARS TWICE DAILY E11.65  . glucose blood test strip Use as instructed    FAMILY HISTORY:  His indicated that his mother is deceased. He indicated that his father is deceased. He indicated that the status of his sister is unknown. He indicated that the status of his brother is unknown. He indicated that the status of his neg hx is unknown.   SOCIAL HISTORY: He  reports that he quit smoking about 19 years ago. He quit after 10.00 years of use. he has never used smokeless tobacco. He reports that he does not drink alcohol or use drugs.  REVIEW OF SYSTEMS:   unable to obtain due to patient's altered mental status and intubated state.     SUBJECTIVE:   No acute events. Pt has been off propofol, per nursing, there has been only about 100 cc of urine output from foley. Lactic acid trending downwards.   VITAL SIGNS: BP 117/75   Pulse (!) 102   Temp 98.4 F (36.9 C) (Core)   Resp (!) 23   Wt 135 lb 5.8 oz (61.4 kg)   SpO2 100%   BMI 20.58 kg/m   HEMODYNAMICS:    VENTILATOR SETTINGS: Vent Mode: PRVC FiO2 (%):  [40 %-100 %] 40 % Set Rate:  [14 bmp] 14 bmp Vt Set:  [500 mL] 500 mL PEEP:  [5 cmH20] 5 cmH20 Plateau Pressure:  [14 cmH20-17 cmH20] 17 cmH20  INTAKE / OUTPUT: I/O last 3 completed shifts: In: 4917.3 [I.V.:2817.3; IV OACZYSAYT:0160] Out: 340 [Urine:240; Emesis/NG output:100]  PHYSICAL EXAMINATION: General:  Sedated, intubated,  Neuro: same  as above, does not open eyes to command or sternal rubs  HEENT:  Ncat, mmm, right pupil reactive to light  Cardiovascular:  RRR, no mrg Lungs:  Symmetric, coarse rhonchi, and some crackles  Abdomen:  Soft , nondistended, normal bowel sounds  Musculoskeletal:  2+ DP pulses, does not spontaneously move extremities. Good muscle tone  Skin:  No obvious rashes, skin intact   LABS:  BMET Recent Labs  Lab 04/22/17 0606 04/12/2017 1015 05/06/2017 1036 04/11/2017 1641 04/28/17 0247  NA 139 139 138  --  139  K 4.6 4.2 4.1  --  4.3  CL 106 105 104  --  108  CO2 24 21*  --   --  17*  BUN 31* 25* 24*  --  27*  CREATININE 1.80* 2.05* 1.90* 2.02* 2.08*  GLUCOSE 130* 83 83  --  85    Electrolytes Recent Labs  Lab 04/22/17 0606 05/01/2017 1015 04/28/17 0247  CALCIUM 8.5* 8.1* 7.8*  MG  --   --  1.4*  PHOS  --   --  4.2    CBC Recent  Labs  Lab 04/22/2017 1015 05/01/2017 1036 05/01/2017 1641 04/28/17 0247  WBC 3.0*  --  4.1 8.6  HGB 7.5* 9.9* 8.5* 8.2*  HCT 22.7* 29.0* 25.3* 24.6*  PLT 261  --  207 189    Coag's Recent Labs  Lab 04/22/17 0619 04/22/2017 1015  INR 0.97 0.93    Sepsis Markers Recent Labs  Lab 04/26/2017 2252 04/28/17 0247 04/28/17 0640  LATICACIDVEN 5.5* 4.6* 4.0*    ABG Recent Labs  Lab 04/23/2017 1015 04/25/2017 1530 04/28/17 0345  PHART 7.525* 7.503* 7.471*  PCO2ART 24.6* 24.9* 27.8*  PO2ART 58.0* 280* 98.8    Liver Enzymes Recent Labs  Lab 05/09/2017 1015  AST 28  ALT 12*  ALKPHOS 83  BILITOT 0.7  ALBUMIN 2.1*    Cardiac Enzymes Recent Labs  Lab 04/23/2017 1015  TROPONINI <0.03    Glucose Recent Labs  Lab 04/28/17 0036 04/28/17 0121 04/28/17 0240 04/28/17 0351 04/28/17 0511 04/28/17 0548  GLUCAP 79 71 77 76 59* 87    Imaging Ct Head Wo Contrast  Result Date: 04/14/2017 CLINICAL DATA:  Nonresponsive patient. EXAM: CT HEAD WITHOUT CONTRAST TECHNIQUE: Contiguous axial images were obtained from the base of the skull through the vertex  without intravenous contrast. COMPARISON:  01/28/2016 FINDINGS: Brain: No evidence of acute infarction, hemorrhage, hydrocephalus, extra-axial collection or mass lesion/mass effect. Mild brain parenchymal volume loss and deep white matter microangiopathy. Vascular: Calcific atherosclerotic disease of the intracavernous carotid artery. Skull: Normal. Negative for fracture or focal lesion. Sinuses/Orbits: No acute finding. Other: None. IMPRESSION: No acute intracranial abnormality. Mild parenchymal atrophy and moderate chronic microvascular disease. Electronically Signed   By: Fidela Salisbury M.D.   On: 04/12/2017 13:23   Dg Chest Portable 1 View  Result Date: 05/06/2017 CLINICAL DATA:  Hypoxia EXAM: PORTABLE CHEST 1 VIEW COMPARISON:  Study obtained earlier in the day FINDINGS: Endotracheal tube tip is 3.6 cm above the carina. There is an apparent chest tube on the left. No pneumothorax. There has been resolution of consolidation from the left base medially. There is patchy airspace opacity throughout the right mid and lower lung zones. Heart size normal. Pulmonary vascularity normal. No adenopathy. IMPRESSION: Endotracheal tube tip 3.6 cm above carina. No pneumothorax. There is now hazy opacity throughout much of the right mid and lower lung zones. Question developing pneumonia or atypical edema. There has been interval clearing of presumed atelectasis from the medial left base. Heart size normal. Electronically Signed   By: Lowella Grip III M.D.   On: 04/29/2017 11:38   Dg Chest Port 1 View  Result Date: 04/28/2017 CLINICAL DATA:  Altered mental status, history of achalasia EXAM: PORTABLE CHEST 1 VIEW COMPARISON:  04/18/2017 FINDINGS: Mild increased vascular congestion and perihilar interstitial prominence suggesting mild volume overload or a developing edema. Stable heart size. No large effusion. No focal pneumonia, collapse or consolidation. Trachea is midline. Aorta is atherosclerotic. Bones are  osteopenic. IMPRESSION: Increased central vascular congestion and interstitial prominence suspicious for volume overload or mild edema. Electronically Signed   By: Jerilynn Mages.  Shick M.D.   On: 04/13/2017 10:22     STUDIES:  CT head 1/19 IMPRESSION: No acute intracranial abnormality.  Mild parenchymal atrophy and moderate chronic microvascular disease.  CXR 1/19 IMPRESSION: Endotracheal tube tip 3.6 cm above carina. No pneumothorax. There is now hazy opacity throughout much of the right mid and lower lung zones. Question developing pneumonia or atypical edema. There has been interval clearing of presumed atelectasis from the medial left base.  Heart size normal.   CULTURES: Blood Culture Urine culture 1/20 > Tracheal aspirate culture 1/20 > pending , gram stain below  MODERATE WBC PRESENT, PREDOMINANTLY PMN  FEW GRAM POSITIVE RODS  FEW GRAM POSITIVE COCCI IN CLUSTERS  RARE GRAM NEGATIVE RODS   Blood culture 1/19 >  MRSA PCR positive     ANTIBIOTICS: Zosyn 1/19 >> Vancomycin 1/19 >>    SIGNIFICANT EVENTS: 1/19 found comatose at home --> ER --> intubated    LINES/TUBES: ETT 1/19 > Foley 1/19 >  DISCUSSION:  77 yo man with DM, achalasia, with recent esophageal dilation on 1/14, CKD3-4,  DVT in 2008, , HTN, hLD, who presents after unable to be awakened by his wife, and glucose of 39 in the ER> currently intubated, and ?seizure/postictal   ASSESSMENT / PLAN:  PULMONARY A: Acute hypoxic respiratory failure Possible aspiration PNA  P:   Currently intubated  PRVC , rate 12, tidal volume 8 ml/kg (560)  IBW FiO2 40%, PEEP 5, plateau 15  -daily spontaneous breathing trial if pt meets weaning criteria   -U/S LE to r/o DVT  -empiric vanc and zosyn for aspiration PNA  CARDIOVASCULAR A:  Prior echo shows G2DD, and elevated PA pressure, at home takes metoprolol History of HTN, HLD   P:  -order echo   RENAL A:   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  DVT ppx SCDs No acute issues, gastrooccult positive , but Ct head neg for hemorrhage  P:  -monitor CBC _SCDs -transfusion threshold HgB of 7   INFECTIOUS A:   Sepsis due to possible aspiration pna  aspiration PNA. CXR 1/19 showing hazy opacity in right mid and lower lung zones.   P:   -trend procalcitonin -vanc and zosyn per pharmacy  -trend lactic acidosis    ENDOCRINE A:   Hypoglycemia presentation in the ER, with history of diabetes controlled by oral medications at home- glipizide, actos, and tradjenta   Glucose of 39 in the ER. Glucose improved with D10, but there was no neurologic improvement   P:   -continue Q1 hour CBG checks , glucose was 59 at 5:11 AM. -continue D5 NS at 125 ml/hr    NEUROLOGIC A:   Sedated and intubated, propofol has been off  CT head neg for hemorrhage  P:   RASS goal: 0,-1 Daily weaning trials if meets criteria    FAMILY  - Updates:   - Inter-disciplinary family meet or Palliative Care meeting due by:  1/25    Signed  Burgess Estelle IMTS  Pulmonary and [place] Pager: [phone]  04/28/2017, 8:38 AM

## 2017-04-28 NOTE — Progress Notes (Addendum)
This rn arrived to room to note pt with eyes open fixed and gazed to the right. Facial and full body seizure like activity witnessed lasting few seconds, onset not witnessed. 100% oxygen breath given, elink notified and camera visual done by nurse as this rn restarted propofol 5 mcg/kg. While at bedside this rn and elink nurse witnessed pt with facial twitching and mouth quivering lasting few seconds. Elink nurse states will inform md of seizure like activity and bedside interventions. Pt conts show a motor response by withdrawing to pain all 4 extremites.

## 2017-04-28 NOTE — Progress Notes (Signed)
Lactic acid (going down) and magnesium (slight increase) values were reported to the resident. Waiting on orders.

## 2017-04-28 NOTE — Progress Notes (Signed)
eLink Physician-Brief Progress Note Patient Name: [name] DOB: 12-28-1940 MRN: [id number]   Date of Service  04/28/2017  HPI/Events of Note  Hypotension - BP = 83/51. Likely related to restarting his Propofol IV infusion for seizure activity. No CVL/   eICU Interventions  Will order: 1. Bolus with 0.9 NaCl 1 liter IV over 1 hour now.     Intervention Category Major Interventions: Hypotension - evaluation and management  [name],[name] Eugene 04/28/2017, 3:32 AM

## 2017-04-28 NOTE — Progress Notes (Signed)
eLink Physician-Brief Progress Note Patient Name: [name] DOB: Nov 07, 1940 MRN: [id number]   Date of Service  04/28/2017  HPI/Events of Note  Multiple issues: 1. Lactic Acid 4.5 --> 5.5, 2. Gastric output heme positive and 3. 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 Also has history of prolonged QT interval, therefore, can't use Protonix.   eICU Interventions  Will order: 1. Bolus with 0.9 NaCl 1 liter IV over 1 hour now.  2. Continue to trend Lactic Acid levels.  3. Place SCD to L leg.  4. Continue Pepcid IV.      Intervention Category Major Interventions: Other:  [name],[name] [name] [name] 04/28/2017, 12:21 AM

## 2017-04-28 NOTE — Progress Notes (Signed)
STAT EEG completed; results pending. Dr Lindzen notified. 

## 2017-04-28 NOTE — Progress Notes (Signed)
eLink Physician-Brief Progress Note Patient Name: [name] DOB: [birth year] MRN: [id number]   Date of Service  04/28/2017  HPI/Events of Note  Hypoglycemia - Blood glucose = 59. Given 1/2 amp D50. Currently on D5 0.9 NaCl IV infusion at 75 ml/hour.  eICU Interventions  Will increase D5 0.9 NaCl IV infusion to 125 mL/hour.     Intervention Category Major Interventions: Other:  [name],[name] [name] [name] 04/28/2017, 5:26 AM

## 2017-04-28 NOTE — Progress Notes (Addendum)
elink Dr Oletta Darter called to inform of pt sbp 70s post seizure like activity and propofol restart. Will give NS bolus now and await hemoglobin to result per md instructions.

## 2017-04-28 NOTE — Progress Notes (Signed)
LTM started; educated nurse on event button. Notified Dr Deborah Chalk.

## 2017-04-28 NOTE — Progress Notes (Signed)
Sputum culture collected, sent to lab.  

## 2017-04-28 NOTE — Progress Notes (Signed)
Brief Nutrition Note  Consult received for enteral/tube feeding initiation and management.  Adult Enteral Nutrition Protocol initiated. Full assessment to follow.  Admitting Dx: Unresponsive  Body mass index is 20.58 kg/m. Pt meets criteria for normal based on current BMI.  Labs:  Recent Labs  Lab 04/22/17 0606 04/12/2017 1015 04/12/2017 1036 04/23/2017 1641 04/28/17 0247  NA 139 139 138  --  139  K 4.6 4.2 4.1  --  4.3  CL 106 105 104  --  108  CO2 24 21*  --   --  17*  BUN 31* 25* 24*  --  27*  CREATININE 1.80* 2.05* 1.90* 2.02* 2.08*  CALCIUM 8.5* 8.1*  --   --  7.8*  MG  --   --   --   --  1.4*  PHOS  --   --   --   --  4.2  GLUCOSE 130* 83 83  --  85    Clayton Bibles, MS, RD, LDN [place] [facility] Pager: [phone] After Hours Pager: [phone]

## 2017-04-28 NOTE — Consult Note (Signed)
NEURO HOSPITALIST CONSULT NOTE   Requesting physician: Dr. Vaughan Browner  Reason for Consult: Possible seizures  History obtained from:  Chart    HPI:                                                                                                                                          [name] is an 77 y.o. male who presented to the ED yesterday with AMS, hypoglycemia and possible seizures. He was diagnosed with acute respiratory failure, aspiration pneumonia and sepsis. There was also concern for GIB.   ICU H and P reviewed: "He was unresponsive but with pulse and respirations. Due to concerns for inability to protect his airway, he was intubated in the ER. The patient's last known well time was last night when he turned in to sleep. The patient's wife is at the bedside and is able to provide some additional helpful history. She believes that the patient was "alrigth" last night, but she went to sleep first. She notes that the patient has been waking up later and later in the mornings and was determined to make him wake up to eat breakfast today. She tried to wake him up at around 0900 but to no avail. She called EMS. Of note, the patient is known to be diabetic, for which he takes PO diabetic medications. He has not been eating well. He has a history of achalasia and underwent esophageal dilatation around 04/22/2017 but apparently has continued to have modest dietary intake (he is typically limited to a liquid diet due to achalasia). His glucose in the ER was 39. He was given D50 with appropriate increase in his glucose level but his mental status has not improved."   CT head on 1/19 showed no acute intracranial abnormality. Mild parenchymal atrophy and moderate chronic microvascular disease were noted.   Earlier this afternoon, recurrent twitching was noted by nursing staff: "Pt continue to have intermittent twitches of feet. Opens eyes, but still does not follow commands."    Past Medical History:  Diagnosis Date  . Anemia    IRON TRANSFUSION 1 MONTH AGO  . Blood clot in vein 2008   LEFT LEG  . Cataract    LEFT  . CKD (chronic kidney disease) 02/2016  . Diabetes mellitus, type 2 ([place]) 2008  . GERD (gastroesophageal reflux disease)   . Hyperlipidemia 2008  . Hypertension 2008  . Pneumonia 01/18/2016  . Prostate cancer ([place]) 2006   IMPLANT IN LEFT ARM FOR [place]    Past Surgical History:  Procedure Laterality Date  . BALLOON DILATION N/A 04/13/2016   Procedure: BALLOON DILATION;  Surgeon: Gatha Mayer, MD;  Location: [facility] ENDOSCOPY;  Service: Endoscopy;  Laterality: N/A;  . BALLOON DILATION N/A 04/22/2017   Procedure: PNEUMATIC  BALLOON DILATION WITH FLORO;  Surgeon: Mauri Pole, MD;  Location: [facility] ENDOSCOPY;  Service: Endoscopy;  Laterality: N/A;  . BOTOX INJECTION N/A 04/13/2016   Procedure: BOTOX INJECTION;  Surgeon: Gatha Mayer, MD;  Location: [place];  Service: Endoscopy;  Laterality: N/A;  . BOTOX INJECTION N/A 01/02/2017   Procedure: BOTOX INJECTION;  Surgeon: Yetta Flock, MD;  Location: [facility] ENDOSCOPY;  Service: Gastroenterology;  Laterality: N/A;  . COLONOSCOPY W/ POLYPECTOMY  03/2008   diminutive rectal polyp (path: prolapse type polyp, not adenoma).  moderate pan-diverticulitis.   Marland Kitchen ESOPHAGEAL MANOMETRY  1984   unable to pass manometry catheter beyond LES. Aperiystalsis of esophagus: sugg of achalasia.   Marland Kitchen ESOPHAGOGASTRODUODENOSCOPY N/A 02/17/2016   Procedure: ESOPHAGOGASTRODUODENOSCOPY (EGD);  Surgeon: [name], MD;  Location: [facility];  Service: Endoscopy;  Laterality: N/A;  . ESOPHAGOGASTRODUODENOSCOPY N/A 04/22/2017   Procedure: ESOPHAGOGASTRODUODENOSCOPY (EGD);  Surgeon: Mauri Pole, MD;  Location: [facility] ENDOSCOPY;  Service: Endoscopy;  Laterality: N/A;  . ESOPHAGOGASTRODUODENOSCOPY (EGD) WITH PROPOFOL N/A 02/21/2016   Procedure: ESOPHAGOGASTRODUODENOSCOPY (EGD) WITH PROPOFOL;  Surgeon: Jerene Bears, MD;   Location: [facility] ENDOSCOPY;  Service: Gastroenterology;  Laterality: N/A;  . ESOPHAGOGASTRODUODENOSCOPY (EGD) WITH PROPOFOL N/A 02/28/2016   Procedure: ESOPHAGOGASTRODUODENOSCOPY (EGD) WITH PROPOFOL withBOTOX;  Surgeon: Manus Gunning, MD;  Location: [facility] ENDOSCOPY;  Service: Gastroenterology;  Laterality: N/A;  . ESOPHAGOGASTRODUODENOSCOPY (EGD) WITH PROPOFOL N/A 04/13/2016   Procedure: ESOPHAGOGASTRODUODENOSCOPY (EGD) WITH PROPOFOL;  Surgeon: Gatha Mayer, MD;  Location: [place];  Service: Endoscopy;  Laterality: N/A;  . ESOPHAGOGASTRODUODENOSCOPY (EGD) WITH PROPOFOL N/A 01/02/2017   Procedure: ESOPHAGOGASTRODUODENOSCOPY (EGD) WITH PROPOFOL;  Surgeon: Yetta Flock, MD;  Location: [facility] ENDOSCOPY;  Service: Gastroenterology;  Laterality: N/A;  . LEFT ARM IMPLANT X2     RADIATION TX ALSO    Family History  Problem Relation Age of Onset  . Diabetes Father   . Diabetes Sister   . Hypertension Sister   . Hypertension Brother   . Heart disease Neg Hx   . Colon cancer Neg Hx   . Stomach cancer Neg Hx     Social History:  reports that he quit smoking about 19 years ago. He quit after 10.00 years of use. he has never used smokeless tobacco. He reports that he does not drink alcohol or use drugs.  No Known Allergies  MEDICATIONS:                                                                                                                     Scheduled: . chlorhexidine gluconate (MEDLINE KIT)  15 mL Mouth Rinse BID  . feeding supplement (PRO-STAT SUGAR FREE 64)  30 mL Per Tube BID  . feeding supplement (VITAL HIGH PROTEIN)  1,000 mL Per Tube Q24H  . heparin injection (subcutaneous)  5,000 Units Subcutaneous Q8H  . insulin aspart  0-9 Units Subcutaneous Q4H  . mouth rinse  15 mL Mouth Rinse QID  . sodium chloride flush  3 mL Intravenous Q12H  Continuous: . sodium chloride 250 mL (04/28/17 1500)  . sodium chloride    . dextrose 5 % and 0.9% NaCl 125 mL/hr at 04/28/17 1500   . famotidine (PEPCID) IV Stopped (04/28/17 1130)  . phenylephrine (NEO-SYNEPHRINE) Adult infusion    . piperacillin-tazobactam 3.375 g (04/28/17 1523)  . sodium chloride 1,000 mL (04/28/17 1519)  . vancomycin Stopped (04/28/17 1450)   MPN:TIRWER chloride, sodium chloride, acetaminophen, midazolam, sodium chloride flush   ROS:                                                                                                                                       Unable to obtain due to AMS.   Blood pressure 122/65, pulse (!) 104, temperature 99.1 F (37.3 C), resp. rate 15, weight 61.4 kg (135 lb 5.8 oz), SpO2 100 %.   General Examination:                                                                                                      HEENT-  [place]/AT. Neck supple. Lungs- Intubated  Neurological Examination Mental Status: Obtunded with eyes open. No response to voice. Does not follow commands. No purposeful movements. No attempts to communicate.  Cranial Nerves: II: Weak blink to threat bilaterally is inconsistent. Does not fixate or track visual stimuli.   III,IV, VI: Eyes deviated to the left conjugately without nystagmus.  V,VII: No facial droop at rest.  VIII: No responses to auditory stimuli IX,X: Intubated XI: Unable to assess XII: Intubated Motor/Sensory: Decreased tone x 4.  No responses to noxious stimuli except for stereotyped triple flexion reflex bilateral lower extremity to plantar stimulation. No asymmetry.  Deep Tendon Reflexes: Hypoactive x 4 Plantars: Triple flexion responses bilaterally Cerebellar/Gait: Unable to assess  Lab Results: Basic Metabolic Panel: Recent Labs  Lab 04/22/17 0606 04/26/2017 1015 04/21/2017 1036 04/21/2017 1641 04/28/17 0247 04/28/17 1116  NA 139 139 138  --  139  --   K 4.6 4.2 4.1  --  4.3  --   CL 106 105 104  --  108  --   CO2 24 21*  --   --  17*  --   GLUCOSE 130* 83 83  --  85  --   BUN 31* 25* 24*  --  27*  --   CREATININE  1.80* 2.05* 1.90* 2.02* 2.08*  --   CALCIUM 8.5* 8.1*  --   --  7.8*  --   MG  --   --   --   --  1.4* 1.3*  PHOS  --   --   --   --  4.2 4.5    Liver Function Tests: Recent Labs  Lab 05/08/2017 1015  AST 28  ALT 12*  ALKPHOS 83  BILITOT 0.7  PROT 4.6*  ALBUMIN 2.1*   No results for input(s): LIPASE, AMYLASE in the last 168 hours. Recent Labs  Lab 04/18/2017 1015  AMMONIA 13    CBC: Recent Labs  Lab 04/22/17 0606 04/21/2017 1015 04/10/2017 1036 05/08/2017 1641 04/28/17 0247  WBC 10.8* 3.0*  --  4.1 8.6  NEUTROABS  --  2.3  --   --   --   HGB 8.5* 7.5* 9.9* 8.5* 8.2*  HCT 25.1* 22.7* 29.0* 25.3* 24.6*  MCV 96.2 94.2  --  94.4 93.5  PLT 197 261  --  207 189    Cardiac Enzymes: Recent Labs  Lab 05/06/2017 1015  TROPONINI <0.03    Lipid Panel: No results for input(s): CHOL, TRIG, HDL, CHOLHDL, VLDL, LDLCALC in the last 168 hours.  CBG: Recent Labs  Lab 04/28/17 0511 04/28/17 0548 04/28/17 0754 04/28/17 0911 04/28/17 1255  GLUCAP 59* 87 108* 124* 151*    Microbiology: Results for orders placed or performed during the hospital encounter of 05/05/2017  MRSA PCR Screening     Status: Abnormal   Collection Time: 05/01/2017  3:10 PM  Result Value Ref Range Status   MRSA by PCR POSITIVE (A) NEGATIVE Final    Comment:        The GeneXpert MRSA Assay (FDA approved for NASAL specimens only), is one component of a comprehensive MRSA colonization surveillance program. It is not intended to diagnose MRSA infection nor to guide or monitor treatment for MRSA infections. RESULT CALLED TO, READ BACK BY AND VERIFIED WITH: S FIELDS,RN AT 1738 05/09/2017 BY L BENFIELD   Blood Cultures (routine x 2)     Status: None (Preliminary result)   Collection Time: 05/06/2017  4:04 PM  Result Value Ref Range Status   Specimen Description BLOOD BLOOD LEFT HAND  Final   Special Requests IN PEDIATRIC BOTTLE Blood Culture adequate volume  Final   Culture NO GROWTH < 24 HOURS  Final   Report  Status PENDING  Incomplete  Blood Cultures (routine x 2)     Status: None (Preliminary result)   Collection Time: 04/10/2017  5:19 PM  Result Value Ref Range Status   Specimen Description BLOOD BLOOD RIGHT HAND  Final   Special Requests IN PEDIATRIC BOTTLE Blood Culture adequate volume  Final   Culture NO GROWTH < 24 HOURS  Final   Report Status PENDING  Incomplete  Culture, respiratory (tracheal aspirate)     Status: None (Preliminary result)   Collection Time: 04/28/17 12:41 AM  Result Value Ref Range Status   Specimen Description TRACHEAL ASPIRATE  Final   Special Requests Normal  Final   Gram Stain   Final    MODERATE WBC PRESENT, PREDOMINANTLY PMN FEW GRAM POSITIVE RODS FEW GRAM POSITIVE COCCI IN CLUSTERS RARE GRAM NEGATIVE RODS    Culture PENDING  Incomplete   Report Status PENDING  Incomplete    Coagulation Studies: Recent Labs    04/30/2017 1015  LABPROT 12.4  INR 0.93    Imaging: Ct Head Wo Contrast  Result Date: 05/03/2017 CLINICAL DATA:  Nonresponsive patient. EXAM: CT HEAD WITHOUT CONTRAST TECHNIQUE: Contiguous axial images were obtained from the base of the skull through the vertex without intravenous contrast. COMPARISON:  01/28/2016 FINDINGS: Brain:  No evidence of acute infarction, hemorrhage, hydrocephalus, extra-axial collection or mass lesion/mass effect. Mild brain parenchymal volume loss and deep white matter microangiopathy. Vascular: Calcific atherosclerotic disease of the intracavernous carotid artery. Skull: Normal. Negative for fracture or focal lesion. Sinuses/Orbits: No acute finding. Other: None. IMPRESSION: No acute intracranial abnormality. Mild parenchymal atrophy and moderate chronic microvascular disease. Electronically Signed   By: Fidela Salisbury M.D.   On: 05/07/2017 13:23   Dg Chest Port 1 View  Result Date: 04/28/2017 CLINICAL DATA:  77 year old male who was unable to be woken for breakfast on the day of admission. Remains unresponsive.  EXAM: PORTABLE CHEST 1 VIEW COMPARISON:  05/03/2017 and earlier. FINDINGS: Portable AP semi upright view at 0658 hours. Stable endotracheal tube tip between the level the clavicles and carina. The enteric tube has a tortuous course through the mediastinum, and the patient is noted to have chronically dilated thoracic esophagus due to achalasia. The enteric tube tip is likely just inside the stomach. The side hole is in the distal thoracic esophagus. Increased retrocardiac opacity. Streaky right greater than left infrahilar opacity appears mildly diminished. No pneumothorax or pulmonary edema. No pleural effusion. Mediastinal contours remain normal. IMPRESSION: 1. ET tube in good position. The enteric tube takes a tortuous course through the patient's dilated thoracic esophagus. The tip is likely just inside the stomach and the side hole is in the distal thoracic esophagus. 2. Streaky infrahilar opacity persists, but appears mildly regressed. Consider aspiration or pneumonia. 3. Recurrent left lower lobe atelectasis/collapse. Electronically Signed   By: Genevie Ann M.D.   On: 04/28/2017 09:14   Dg Chest Portable 1 View  Result Date: 04/12/2017 CLINICAL DATA:  Hypoxia EXAM: PORTABLE CHEST 1 VIEW COMPARISON:  Study obtained earlier in the day FINDINGS: Endotracheal tube tip is 3.6 cm above the carina. There is an apparent chest tube on the left. No pneumothorax. There has been resolution of consolidation from the left base medially. There is patchy airspace opacity throughout the right mid and lower lung zones. Heart size normal. Pulmonary vascularity normal. No adenopathy. IMPRESSION: Endotracheal tube tip 3.6 cm above carina. No pneumothorax. There is now hazy opacity throughout much of the right mid and lower lung zones. Question developing pneumonia or atypical edema. There has been interval clearing of presumed atelectasis from the medial left base. Heart size normal. Electronically Signed   By: Lowella Grip  III M.D.   On: 04/25/2017 11:38   Dg Chest Port 1 View  Result Date: 05/07/2017 CLINICAL DATA:  Altered mental status, history of achalasia EXAM: PORTABLE CHEST 1 VIEW COMPARISON:  04/18/2017 FINDINGS: Mild increased vascular congestion and perihilar interstitial prominence suggesting mild volume overload or a developing edema. Stable heart size. No large effusion. No focal pneumonia, collapse or consolidation. Trachea is midline. Aorta is atherosclerotic. Bones are osteopenic. IMPRESSION: Increased central vascular congestion and interstitial prominence suspicious for volume overload or mild edema. Electronically Signed   By: Jerilynn Mages.  Shick M.D.   On: 04/18/2017 10:22   Assessment: 77 year old male presenting to the ER with altered mental status. Glucose in the ED was 39 1. Overall presentation suggestive of diffuse cortical injury secondary to prolonged hypoglycemia. Large completed brainstem stroke or bihemispheric strokes also possible, but lower on the DDx.  2. EEG preliminarily reviewed at the bedside. There is a change to a higher amplitude rhythmic versus periodic pattern during repetitive mouth movements. Overall findings suggestive of possible intermittent epileptic seizure.  3. CT head reveals no acute intracranial  abnormality. Mild parenchymal atrophy and moderate chronic microvascular disease are noted.   4. Ammonia level is normal. Transaminases not elevated.  5. Hypomagnesemia and hypo calcemia. Both likely resulting in lowered seizure threshold.  6. AKI.  Recommendations: 1. Changing spot EEG to LTM EEG 2. Load with valproic acid, 20 mg/kg, then scheduled dosing at 5 mg/kg IV TID. 3. Supportive care as per primary team 4. Correct magnesium level.  5. MRI when able. Cannot currently obtain due to greater need for LTM EEG monitoring.  6. Discussed with ICU team  35 minutes spent in the Neurological evaluation and management of this critically ill patient. Time spent included EEG review  and coordination of care.   Electronically signed: Dr. Kerney Elbe 04/28/2017, 3:58 PM

## 2017-04-28 NOTE — Progress Notes (Signed)
Called elink, informed Dr Oletta Darter of pt lactic level 5.5, decreased urine ouput and positive gastric secretions

## 2017-04-28 NOTE — Procedures (Signed)
EEG Report  Clinical History:  Found unresponsive with glucose 39.    Technical Summary:  A 19 channel digital EEG recording was performed using the 10-20 international system of electrode placement.  Bipolar and Referential montages were used.  The total recording time was approx 20 minutes.  Findings:  There is extensive muscle artifact throughout the study obscuring the cerebral activity.  Background frequencies average about 5 Hz and symmetrical between both hemispheres.  There are no epileptiform discharges or electrographic seizures.   Impression:  This is an abnormal EEG, but sub-optimal in quality due to muscle tension.  There is moderate generalized slowing of brain activity, which is non-specific, but may be due to toxic, metabolic, infectious, or hypoxic etiologies.  The patient is not in non-convulsive status epilepticus.  Rogue Jury, MS, MD

## 2017-04-29 ENCOUNTER — Inpatient Hospital Stay (HOSPITAL_COMMUNITY): Payer: Medicare Other

## 2017-04-29 ENCOUNTER — Encounter (HOSPITAL_COMMUNITY): Payer: Medicare Other

## 2017-04-29 ENCOUNTER — Telehealth: Payer: Self-pay

## 2017-04-29 DIAGNOSIS — M7989 Other specified soft tissue disorders: Secondary | ICD-10-CM

## 2017-04-29 DIAGNOSIS — J9601 Acute respiratory failure with hypoxia: Secondary | ICD-10-CM

## 2017-04-29 DIAGNOSIS — R569 Unspecified convulsions: Secondary | ICD-10-CM

## 2017-04-29 LAB — COMPREHENSIVE METABOLIC PANEL
ALBUMIN: 1.6 g/dL — AB (ref 3.5–5.0)
ALT: 9 U/L — ABNORMAL LOW (ref 17–63)
AST: 34 U/L (ref 15–41)
Alkaline Phosphatase: 124 U/L (ref 38–126)
Anion gap: 17 — ABNORMAL HIGH (ref 5–15)
BILIRUBIN TOTAL: 0.5 mg/dL (ref 0.3–1.2)
BUN: 36 mg/dL — AB (ref 6–20)
CHLORIDE: 113 mmol/L — AB (ref 101–111)
CO2: 12 mmol/L — ABNORMAL LOW (ref 22–32)
Calcium: 7.4 mg/dL — ABNORMAL LOW (ref 8.9–10.3)
Creatinine, Ser: 2.41 mg/dL — ABNORMAL HIGH (ref 0.61–1.24)
GFR calc Af Amer: 28 mL/min — ABNORMAL LOW (ref >60)
GFR calc non Af Amer: 25 mL/min — ABNORMAL LOW (ref >60)
GLUCOSE: 73 mg/dL (ref 65–99)
POTASSIUM: 3.7 mmol/L (ref 3.5–5.1)
Sodium: 142 mmol/L (ref 135–145)
Total Protein: 3.7 g/dL — ABNORMAL LOW (ref 6.5–8.1)

## 2017-04-29 LAB — CBC
HEMATOCRIT: 21.8 % — AB (ref 39.0–52.0)
Hemoglobin: 7.1 g/dL — ABNORMAL LOW (ref 13.0–17.0)
MCH: 30.6 pg (ref 26.0–34.0)
MCHC: 32.6 g/dL (ref 30.0–36.0)
MCV: 94 fL (ref 78.0–100.0)
PLATELETS: 165 10*3/uL (ref 150–400)
RBC: 2.32 MIL/uL — AB (ref 4.22–5.81)
RDW: 14 % (ref 11.5–15.5)
WBC: 16.1 10*3/uL — AB (ref 4.0–10.5)

## 2017-04-29 LAB — ECHOCARDIOGRAM COMPLETE
Ao-asc: 31 cm
CHL CUP MV DEC (S): 164
CHL CUP TV REG PEAK VELOCITY: 271 cm/s
E decel time: 164 msec
EERAT: 10.49
FS: 23 % — AB (ref 28–44)
IV/PV OW: 1.32
LA ID, A-P, ES: 32 mm
LA diam end sys: 32 mm
LA diam index: 1.85 cm/m2
LA vol A4C: 28.4 ml
LA vol index: 18.9 mL/m2
LA vol: 32.7 mL
LVEEAVG: 10.49
LVEEMED: 10.49
LVELAT: 7.72 cm/s
LVOT VTI: 16.3 cm
LVOT area: 3.46 cm2
LVOT diameter: 21 mm
LVOT peak vel: 91.6 cm/s
LVOTSV: 56 mL
Lateral S' vel: 21.4 cm/s
MV Peak grad: 3 mmHg
MV pk A vel: 101 m/s
MVPKEVEL: 81 m/s
PW: 8.46 mm — AB (ref 0.6–1.1)
RV TAPSE: 13.4 mm
TDI e' lateral: 7.72
TDI e' medial: 6.31
TR max vel: 271 cm/s
WEIGHTICAEL: 2165.8 [oz_av]

## 2017-04-29 LAB — MAGNESIUM
MAGNESIUM: 1.7 mg/dL (ref 1.7–2.4)
Magnesium: 1.7 mg/dL (ref 1.7–2.4)

## 2017-04-29 LAB — GLUCOSE, CAPILLARY
GLUCOSE-CAPILLARY: 120 mg/dL — AB (ref 65–99)
GLUCOSE-CAPILLARY: 113 mg/dL — AB (ref 65–99)
GLUCOSE-CAPILLARY: 115 mg/dL — AB (ref 65–99)
Glucose-Capillary: 76 mg/dL (ref 65–99)
Glucose-Capillary: 65 mg/dL (ref 65–99)
Glucose-Capillary: 116 mg/dL — ABNORMAL HIGH (ref 65–99)
Glucose-Capillary: 143 mg/dL — ABNORMAL HIGH (ref 65–99)

## 2017-04-29 LAB — PHOSPHORUS
PHOSPHORUS: 4 mg/dL (ref 2.5–4.6)
Phosphorus: 3.7 mg/dL (ref 2.5–4.6)

## 2017-04-29 LAB — URINE CULTURE
CULTURE: NO GROWTH
Special Requests: NORMAL

## 2017-04-29 MED ORDER — LEVETIRACETAM 500 MG/5ML IV SOLN
1000.0000 mg | INTRAVENOUS | Status: AC
  Administered 2017-04-29: 1000 mg via INTRAVENOUS
  Filled 2017-04-29: qty 10

## 2017-04-29 MED ORDER — SODIUM CHLORIDE 0.9 % IV SOLN
500.0000 mg | Freq: Two times a day (BID) | INTRAVENOUS | Status: DC
  Administered 2017-04-30 – 2017-05-03 (×7): 500 mg via INTRAVENOUS
  Filled 2017-04-29 (×8): qty 5

## 2017-04-29 MED ORDER — DEXTROSE 50 % IV SOLN
INTRAVENOUS | Status: AC
  Filled 2017-04-29: qty 50

## 2017-04-29 MED ORDER — MUPIROCIN 2 % EX OINT
1.0000 "application " | TOPICAL_OINTMENT | Freq: Two times a day (BID) | CUTANEOUS | Status: DC
  Administered 2017-04-29 – 2017-05-03 (×9): 1 via NASAL
  Filled 2017-04-29: qty 22

## 2017-04-29 MED ORDER — SODIUM CHLORIDE 0.9 % IV SOLN
INTRAVENOUS | Status: DC
  Administered 2017-04-29: 12:00:00 via INTRAVENOUS

## 2017-04-29 MED ORDER — FAMOTIDINE 40 MG/5ML PO SUSR
20.0000 mg | Freq: Two times a day (BID) | ORAL | Status: DC
Start: 2017-04-29 — End: 2017-04-30
  Administered 2017-04-29: 20 mg
  Filled 2017-04-29 (×2): qty 2.5

## 2017-04-29 MED ORDER — VITAL AF 1.2 CAL PO LIQD
1000.0000 mL | ORAL | Status: DC
  Administered 2017-04-29 – 2017-05-02 (×5): 1000 mL

## 2017-04-29 MED ORDER — CHLORHEXIDINE GLUCONATE CLOTH 2 % EX PADS
6.0000 | MEDICATED_PAD | Freq: Every day | CUTANEOUS | Status: AC
  Administered 2017-04-29 – 2017-05-03 (×5): 6 via TOPICAL

## 2017-04-29 MED ORDER — DEXTROSE 50 % IV SOLN
25.0000 mL | Freq: Once | INTRAVENOUS | Status: AC
Start: 2017-04-29 — End: 2017-04-29
  Administered 2017-04-29: 25 mL via INTRAVENOUS
  Filled 2017-04-29: qty 50

## 2017-04-29 MED ORDER — DEXTROSE-NACL 5-0.9 % IV SOLN
INTRAVENOUS | Status: DC
  Administered 2017-04-29 – 2017-04-30 (×2): via INTRAVENOUS

## 2017-04-29 NOTE — Progress Notes (Signed)
LTM maintenanced; reprepped O1, O2, A2, Cz, and C4. No skin breakdown was seen.

## 2017-04-29 NOTE — Progress Notes (Addendum)
Initial Nutrition Assessment  DOCUMENTATION CODES:   Not applicable  INTERVENTION:    Vital AF 1.2 at 60 ml/h (1440 ml per day)   Provides 1728 kcal, 108 gm protein, 1168 ml free water daily  NUTRITION DIAGNOSIS:   Inadequate oral intake related to inability to eat as evidenced by NPO status.  GOAL:   Patient will meet greater than or equal to 90% of their needs  MONITOR:   Vent status, TF tolerance, Labs, I & O's  REASON FOR ASSESSMENT:   Consult Enteral/tube feeding initiation and management  ASSESSMENT:   77 yo male with PMH of HTN, HLD, CKD, DM-2, achalasia (usual intake limited to liquids), GERD, prostate cancer, anemia who was admitted on 1/19 with AMS, requiring intubation in the ED.   Received MD Consult for TF initiation and management. Patient is currently intubated on ventilator support MV: 11 L/min Temp (24hrs), Avg:99.9 F (37.7 C), Min:98.5 F (36.9 C), Max:101.1 F (38.4 C)   Patient is currently receiving Vital High Protein via OGT at 40 ml/h (960 ml/day) with Prostat 30 ml BID to provide 1160 kcals, 114 gm protein, 803 ml free water daily.  During last admission, patient was identified to have severe malnutrition with severe depletion of muscle and subcutaneous fat mass. Suspect today's nutrition focused physical exam was affected by volume overload / edema. I/O +8 L since admission From review of weight encounters, current weight is above last weight of 119 lbs on last admission, likely related to positive fluid status. Labs and medications reviewed.  CBG's: 76-65  NUTRITION - FOCUSED PHYSICAL EXAM:    Most Recent Value  Orbital Region  Unable to assess  Upper Arm Region  Mild depletion  Thoracic and Lumbar Region  Unable to assess  Buccal Region  Unable to assess  Temple Region  Unable to assess  Clavicle Bone Region  Mild depletion  Clavicle and Acromion Bone Region  Mild depletion  Scapular Bone Region  Unable to assess  Dorsal Hand   No depletion  Patellar Region  Mild depletion  Anterior Thigh Region  Mild depletion  Posterior Calf Region  Mild depletion  Edema (RD Assessment)  Mild  Hair  Unable to assess  Eyes  Unable to assess  Mouth  Unable to assess  Skin  Reviewed  Nails  Reviewed       Diet Order:  Diet NPO time specified  EDUCATION NEEDS:   No education needs have been identified at this time  Skin:  Skin Assessment: Skin Integrity Issues: Skin Integrity Issues:: Other (Comment) Other: 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  Estimated Nutritional Needs:   Kcal:  1810  Protein:  95-110 gm  Fluid:  >/= 1.8 L   Molli Barrows, RD, LDN, [place] Pager [phone] After Hours Pager [phone]

## 2017-04-29 NOTE — Progress Notes (Signed)
Text paged DR Halford Chessman to reorder CBG's q 4 hr - and notified of am labs Hgb & Hct

## 2017-04-29 NOTE — Progress Notes (Signed)
  Echocardiogram 2D Echocardiogram has been performed.  Darlina Sicilian M 04/29/2017, 10:04 AM

## 2017-04-29 NOTE — Progress Notes (Signed)
PULMONARY / CRITICAL CARE MEDICINE   Name: [name] MRN: [id number] DOB: 26-May-1940    ADMISSION DATE:  04/15/2017 CONSULTATION DATE:  1/19  REFERRING MD:  ER  CHIEF COMPLAINT:  AMS  HISTORY OF PRESENT ILLNESS:    This 77 y.o. African American male presented to the ER with altered mental status. He was unresponsive but with pulse and respirations. Due to concerns for inability to protect his airway, he was intubated in the ER. The patient's last known well time was last night when he turned in to sleep. The patient's wife is at the bedside and is able to provide some additional helpful history. She believes that the patient was "alrigth" last night, but she went to sleep first. She notes that the patient has been waking up later and later in the mornings and was determined to make him wake up to eat breakfast today. She tried to wake him up at around 0900 but to no avail. She called EMS.  Of note, the patient is known to be diabetic, for which he takes PO diabetic medications. He has not been eating well. He has a history of achalasia and underwent esophageal dilatation around 04/22/2017 but apparently has continued to have modest dietary intake (he is typically limited to a liquid diet due to achalasia). His glucose in the ER was 39. He was given D50 with appropriate increase in his glucose level but his mental status has not improved.  This patient is critically ill and cannot provide additional history due to Unconsciousness and Ventilated.    PAST MEDICAL HISTORY :  He  has a past medical history of Anemia, Blood clot in vein (2008), Cataract, CKD (chronic kidney disease) (02/2016), Diabetes mellitus, type 2 ([place]) (2008), GERD (gastroesophageal reflux disease), Hyperlipidemia (2008), Hypertension (2008), Pneumonia (01/18/2016), and Prostate cancer ([place]) (2006).  PAST SURGICAL HISTORY: He  has a past surgical history that includes Colonoscopy w/ polypectomy (03/2008); Esophageal  manometry (1984); Esophagogastroduodenoscopy (N/A, 02/17/2016); Esophagogastroduodenoscopy (egd) with propofol (N/A, 02/21/2016); Esophagogastroduodenoscopy (egd) with propofol (N/A, 02/28/2016); Esophagogastroduodenoscopy (egd) with propofol (N/A, 04/13/2016); Botox injection (N/A, 04/13/2016); Balloon dilation (N/A, 04/13/2016); LEFT ARM IMPLANT X2; Esophagogastroduodenoscopy (egd) with propofol (N/A, 01/02/2017); Botox injection (N/A, 01/02/2017); Esophagogastroduodenoscopy (N/A, 04/22/2017); and Balloon dilation (N/A, 04/22/2017).  No Known Allergies  No current facility-administered medications on file prior to encounter.    Current Outpatient Medications on File Prior to Encounter  Medication Sig  . Amino Acids-Protein Hydrolys (FEEDING SUPPLEMENT, PRO-STAT SUGAR FREE 64,) LIQD Take 30 mLs by mouth 2 (two) times daily. (Patient taking differently: Take 30 mLs by mouth daily. )  . atorvastatin (LIPITOR) 10 MG tablet Take 1 tablet (10 mg total) by mouth at bedtime.  . docusate sodium (COLACE) 100 MG capsule TAKE ONE CAPSULE BY MOUTH TWICE A DAY FOR CONSTIPATION (Patient taking differently: TAKE ONE CAPSULE (10 MG) BY MOUTH TWICE A DAY FOR CONSTIPATION)  . enzalutamide (XTANDI) 40 MG capsule Take 160 mg by mouth daily.  . feeding supplement, ENSURE ENLIVE, (ENSURE ENLIVE) LIQD Take 237 mLs by mouth 2 (two) times daily between meals.  . ferrous sulfate 325 (65 FE) MG tablet TAKE 1 TABLET BY MOUTH EVERY DAY FOR SUPPLEMENT (Patient taking differently: 325 mg. TAKE 1 TABLET BY MOUTH EVERY DAY FOR SUPPLEMENT)  . furosemide (LASIX) 80 MG tablet Take 0.5 tablets (40 mg total) by mouth daily.  Marland Kitchen glipiZIDE (GLUCOTROL XL) 5 MG 24 hr tablet Take 5 mg by mouth daily with breakfast.  . metoprolol tartrate (LOPRESSOR)  25 MG tablet Take 0.5 tablets (12.5 mg total) by mouth 2 (two) times daily.  . Multiple Vitamin (MULTIVITAMIN WITH MINERALS) TABS tablet Take 1 tablet by mouth daily.  . ondansetron (ZOFRAN-ODT) 4 MG  disintegrating tablet Take 1 tablet (4 mg total) by mouth every 8 (eight) hours as needed for nausea or vomiting.  . pantoprazole (PROTONIX) 40 MG tablet TAKE 1 TABLET TWICE A DAY (Patient taking differently: TAKE 1 TABLET (40 MG) TWICE A DAY)  . pioglitazone (ACTOS) 15 MG tablet Take 1 tablet (15 mg total) daily by mouth.  . TRADJENTA 5 MG TABS tablet TAKE 1 TABLET (5 MG TOTAL) BY MOUTH DAILY.  Marland Kitchen ACCU-CHEK FASTCLIX LANCETS MISC Use to check blood sugars twice daily E11.65  . ACCU-CHEK FASTCLIX LANCETS MISC USE TO CHECK BLOOD SUGARS TWICE DAILY E11.65  . glucose blood test strip Use as instructed    FAMILY HISTORY:  His indicated that his mother is deceased. He indicated that his father is deceased. He indicated that the status of his sister is unknown. He indicated that the status of his brother is unknown. He indicated that the status of his neg hx is unknown.   SOCIAL HISTORY: He  reports that he quit smoking about 19 years ago. He quit after 10.00 years of use. he has never used smokeless tobacco. He reports that he does not drink alcohol or use drugs.  REVIEW OF SYSTEMS:   unable to obtain due to patient's altered mental status and intubated state.     SUBJECTIVE:   No acute events.  Pt still not following any commands     VITAL SIGNS: BP 114/60   Pulse (!) 106   Temp 99.5 F (37.5 C)   Resp 14   Wt 135 lb 5.8 oz (61.4 kg)   SpO2 100%   BMI 20.58 kg/m   HEMODYNAMICS:    VENTILATOR SETTINGS: Vent Mode: PRVC FiO2 (%):  [40 %] 40 % Set Rate:  [14 bmp] 14 bmp Vt Set:  [500 mL] 500 mL PEEP:  [5 cmH20] 5 cmH20 Plateau Pressure:  [14 cmH20-21 cmH20] 14 cmH20  INTAKE / OUTPUT: I/O last 3 completed shifts: In: 6829.7 [I.V.:2729.8; NG/GT:600; IV Piggyback:3499.9] Out: 55 [Urine:365; Emesis/NG output:100]  PHYSICAL EXAMINATION: General:  Sedated, intubated,  Neuro: same as above, does not open eyes to command or sternal rubs  HEENT:  Ncat, mmm, right pupil  reactive to light  Cardiovascular:  RRR, no mrg Lungs:  Symmetric, coarse rhonchi, and some crackles  Abdomen:  Soft , nondistended, normal bowel sounds  Musculoskeletal:  2+ DP pulses, does not spontaneously move extremities. Good muscle tone  Skin:  No obvious rashes, skin intact   LABS:  BMET Recent Labs  Lab 04/16/2017 1015 04/13/2017 1036 04/15/2017 1641 04/28/17 0247  NA 139 138  --  139  K 4.2 4.1  --  4.3  CL 105 104  --  108  CO2 21*  --   --  17*  BUN 25* 24*  --  27*  CREATININE 2.05* 1.90* 2.02* 2.08*  GLUCOSE 83 83  --  85    Electrolytes Recent Labs  Lab 04/09/2017 1015  04/28/17 0247 04/28/17 1116 04/28/17 1608 04/29/17 0609  CALCIUM 8.1*  --  7.8*  --   --   --   MG  --    < > 1.4* 1.3* 1.7 1.7  PHOS  --    < > 4.2 4.5 4.3 4.0   < > = values  in this interval not displayed.    CBC Recent Labs  Lab 04/25/2017 1015 04/25/2017 1036 04/29/2017 1641 04/28/17 0247  WBC 3.0*  --  4.1 8.6  HGB 7.5* 9.9* 8.5* 8.2*  HCT 22.7* 29.0* 25.3* 24.6*  PLT 261  --  207 189    Coag's Recent Labs  Lab 04/12/2017 1015  INR 0.93    Sepsis Markers Recent Labs  Lab 04/28/17 1116 04/28/17 1439 04/28/17 1608  LATICACIDVEN 3.3* 2.5* 2.7*    ABG Recent Labs  Lab 05/09/2017 1015 04/17/2017 1530 04/28/17 0345  PHART 7.525* 7.503* 7.471*  PCO2ART 24.6* 24.9* 27.8*  PO2ART 58.0* 280* 98.8    Liver Enzymes Recent Labs  Lab 04/09/2017 1015  AST 28  ALT 12*  ALKPHOS 83  BILITOT 0.7  ALBUMIN 2.1*    Cardiac Enzymes Recent Labs  Lab 04/09/2017 1015  TROPONINI <0.03    Glucose Recent Labs  Lab 04/28/17 1255 04/28/17 1629 04/28/17 1941 04/28/17 2352 04/29/17 0340 04/29/17 0841  GLUCAP 151* 126* 124* 103* 76 65    Imaging No results found.   STUDIES:  CT head 1/19 IMPRESSION: No acute intracranial abnormality.  Mild parenchymal atrophy and moderate chronic microvascular disease.  CXR 1/19 IMPRESSION: Endotracheal tube tip 3.6 cm above carina.  No pneumothorax. There is now hazy opacity throughout much of the right mid and lower lung zones. Question developing pneumonia or atypical edema. There has been interval clearing of presumed atelectasis from the medial left base. Heart size normal.   CULTURES: Blood Culture Urine culture 1/20 > Tracheal aspirate culture 1/20 > pending , gram stain below  MODERATE WBC PRESENT, PREDOMINANTLY PMN  FEW GRAM POSITIVE RODS  FEW GRAM POSITIVE COCCI IN CLUSTERS  RARE GRAM NEGATIVE RODS   Blood culture 1/19 >  MRSA PCR positive     ANTIBIOTICS: Zosyn 1/19 >> Vancomycin 1/19 >>    SIGNIFICANT EVENTS: 1/19 found comatose at home --> ER --> intubated    LINES/TUBES: ETT 1/19 > Foley 1/19 >  DISCUSSION:  77 yo man with DM, achalasia, with recent esophageal dilation on 1/14, CKD3-4,  DVT in 2008, , HTN, hLD, who presents after unable to be awakened by his wife, and glucose of 39 in the ER> currently intubated, and ?seizure/postictal   ASSESSMENT / PLAN:  PULMONARY A: Acute hypoxic respiratory failure Possible aspiration PNA  P:   Currently intubated  PRVC , rate 12, tidal volume 8 ml/kg (560)  IBW FiO2 40%, PEEP 5, plateau 15  -daily spontaneous breathing trial if pt meets weaning criteria   -empiric vanc and zosyn for aspiration PNA  CARDIOVASCULAR A:  Prior echo shows G2DD, and elevated PA pressure, at home takes metoprolol History of HTN, HLD   P:  -order echo   RENAL A:   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  DVT ppx SCDs No acute issues, gastrooccult positive , but Ct head  neg for hemorrhage  P:  -monitor CBC _SCDs -transfusion threshold HgB of 7   INFECTIOUS A:   Sepsis due  to possible aspiration pna  aspiration PNA. CXR 1/19 showing hazy opacity in right mid and lower lung zones.   P:    -vanc and zosyn per pharmacy     ENDOCRINE A:   Hypoglycemia presentation in the ER, with history of diabetes controlled by oral medications at home- glipizide, actos, and tradjenta   Glucose of 39 in the ER. Glucose improved with D10, but there was no neurologic improvement.  Overnight, BG has been 74-124. Received 2 units of SSI.   P:   -on SSI q4 hours  -continue D5 NS at 125 ml/hr    NEUROLOGIC A:    Sedated and intubated, propofol has been off  CT head neg for hemorrhage   Encephalopathy due to hypoglycemia (prolonged?- as he is on 3 oral diabetes meds at home), and possible seizures- neurology was consulted.  Per neurology,  overall presentation consistent with diffuse cortical injury secondary to prolonged hypoglycemia,  Large brainstem stroke is also possible but lower on the differential.  EEG also suggests possible intermittent epileptic seizures  Hypomag and Hypok- were repleted yest as they can also lower zeisure threshold   P:   -neurology following and appreciate recs , currently doing LTM EEG from spot EEG  -valproic acid per neurology -MRI when able  RASS goal: 0,-1 Daily weaning trials if meets criteria    FAMILY  - Updates:   - Inter-disciplinary family meet or Palliative Care meeting due by:  1/25    Signed  Burgess Estelle IMTS  Pulmonary and [place] Pager: [phone]  04/29/2017, 8:49 AM

## 2017-04-29 NOTE — Telephone Encounter (Signed)
Yes I saw this. Thank you for the update.

## 2017-04-29 NOTE — Progress Notes (Signed)
*  Preliminary Results* Right lower extremity venous duplex completed. There is no evidence of acute deep vein thrombosis involving the right lower extremity. There is no evidence of right Baker's cyst.  04/29/2017 11:31 AM  Maudry Mayhew, BS, RVT, RDCS, RDMS

## 2017-04-29 NOTE — Telephone Encounter (Signed)
Copied from [place] [phone]. Topic: General - Other >> Apr 29, 2017  8:15 AM Synthia Innocent wrote: Reason for CRM: Wife wanted to make dr aware he was in ICU

## 2017-04-29 NOTE — Consult Note (Addendum)
[facility] Nurse wound consult note Reason for Consult: Consult requested for sacrum/buttocks Wound type: Pt has patchy areas of partial thickness skin loss near rectum, red and moist, and white macerated fissure in sacrum area; appearance is consistent with moisture associated skin damage. Pt is frequently incontinent of stool and it is difficult to keep the location from becoming soiled. Pressure Injury POA: This is NOT a pressure injury and was noted as present on admission. Measurement: 3X.1cm white moist fissure related to moisture. Dressing procedure/placement/frequency: Pt is on a low airloss bed to reduce pressure.  Foam dressing to absorb drainage and protect the location. Please re-consult if further assistance is needed.  Thank-you,  Julien Girt MSN, [place], [place], [place], [place]

## 2017-04-29 NOTE — Progress Notes (Signed)
Neurology Progress Note   S:// Seen and examined. No acute changes  O:// Current vital signs: BP (!) 124/59   Pulse 94   Temp 99.1 F (37.3 C)   Resp 16   Wt 61.4 kg (135 lb 5.8 oz)   SpO2 100%   BMI 20.58 kg/m  Vital signs in last 24 hours: Temp:  [98.5 F (36.9 C)-101.1 F (38.4 C)] 99.1 F (37.3 C) (01/21 1300) Pulse Rate:  [83-118] 94 (01/21 1300) Resp:  [12-28] 16 (01/21 1300) BP: (82-133)/(51-80) 124/59 (01/21 1300) SpO2:  [100 %] 100 % (01/21 1300) FiO2 (%):  [40 %] 40 % (01/21 1201) Neurological Examination Mental Status: Obtunded with eyes closed. No response to voice. Does not follow commands. No purposeful movements. No attempts to communicate.  Cranial Nerves: II: Does not fixate or track visual stimuli.   III,IV, VI: left exotropia, right eye midline V,VII: No facial droop at rest.  VIII: No responses to auditory stimuli IX,X: Intubated XI: Unable to assess XII: Intubated Motor/Sensory: Decreased tone x 4.  No responses to noxious stimuli except for stereotyped triple flexion reflex bilateral lower extremity to plantar stimulation. No asymmetry.  Deep Tendon Reflexes: Hypoactive x 4 Plantars: Triple flexion responses bilaterally Cerebellar/Gait: Unable to assess  Medications  Current Facility-Administered Medications:  .  0.9 %  sodium chloride infusion, 250 mL, Intravenous, PRN, Sherwood Gambler, MD, Last Rate: 10 mL/hr at 04/28/17 1800, 250 mL at 04/28/17 1800 .  0.9 %  sodium chloride infusion, 250 mL, Intravenous, PRN, Renee Pain, MD .  acetaminophen (TYLENOL) tablet 650 mg, 650 mg, Oral, Q6H PRN, Renee Pain, MD, 650 mg at 04/29/17 0612 .  chlorhexidine gluconate (MEDLINE KIT) (PERIDEX) 0.12 % solution 15 mL, 15 mL, Mouth Rinse, BID, Mannam, Praveen, MD, 15 mL at 04/29/17 0900 .  Chlorhexidine Gluconate Cloth 2 % PADS 6 each, 6 each, Topical, Q0600, Mannam, Praveen, MD, 6 each at 04/29/17 0700 .  dextrose 5 %-0.9 % sodium chloride  infusion, , Intravenous, Continuous, Sood, Vineet, MD, Last Rate: 50 mL/hr at 04/29/17 1436 .  dextrose 50 % solution, , , ,  .  famotidine (PEPCID) 40 MG/5ML suspension 20 mg, 20 mg, Per Tube, Q12H, Sood, Vineet, MD .  feeding supplement (VITAL AF 1.2 CAL) liquid 1,000 mL, 1,000 mL, Per Tube, Continuous, Sood, Vineet, MD, Last Rate: 60 mL/hr at 04/29/17 1230, 1,000 mL at 04/29/17 1230 .  heparin injection 5,000 Units, 5,000 Units, Subcutaneous, Q8H, Burgess Estelle, MD, 5,000 Units at 04/29/17 1431 .  insulin aspart (novoLOG) injection 0-9 Units, 0-9 Units, Subcutaneous, Q4H, Burgess Estelle, MD, 1 Units at 04/28/17 2134 .  magnesium sulfate IVPB 2 g 50 mL, 2 g, Intravenous, Once, Burgess Estelle, MD .  MEDLINE mouth rinse, 15 mL, Mouth Rinse, QID, Mannam, Praveen, MD, 15 mL at 04/29/17 1136 .  midazolam (VERSED) injection 2 mg, 2 mg, Intravenous, Q2H PRN, Burgess Estelle, MD, 2 mg at 04/28/17 1400 .  mupirocin ointment (BACTROBAN) 2 % 1 application, 1 application, Nasal, BID, Mannam, Praveen, MD, 1 application at 29/47/65 0939 .  piperacillin-tazobactam (ZOSYN) IVPB 3.375 g, 3.375 g, Intravenous, Q8H, Renee Pain, MD, Last Rate: 12.5 mL/hr at 04/29/17 1431, 3.375 g at 04/29/17 1431 .  sodium chloride flush (NS) 0.9 % injection 3 mL, 3 mL, Intravenous, Q12H, Sherwood Gambler, MD, 3 mL at 04/29/17 0940 .  sodium chloride flush (NS) 0.9 % injection 3 mL, 3 mL, Intravenous, PRN, Sherwood Gambler, MD .  valproate (DEPACON) 300 mg in dextrose  5 % 50 mL IVPB, 300 mg, Intravenous, Q8H, Kerney Elbe, MD, Last Rate: 53 mL/hr at 04/29/17 1431, 300 mg at 04/29/17 1431 .  vancomycin (VANCOCIN) 500 mg in sodium chloride 0.9 % 100 mL IVPB, 500 mg, Intravenous, Q24H, Charlton Haws, [place], Last Rate: 100 mL/hr at 04/29/17 1300, 500 mg at 04/29/17 1300 Labs CBC    Component Value Date/Time   WBC 16.1 (H) 04/29/2017 0755   RBC 2.32 (L) 04/29/2017 0755   HGB 7.1 (L) 04/29/2017 0755   HGB 9.2 (L) 01/25/2016  0912   HCT 21.8 (L) 04/29/2017 0755   HCT 29.2 (L) 01/25/2016 0912   PLT 165 04/29/2017 0755   PLT 279 01/25/2016 0912   MCV 94.0 04/29/2017 0755   MCV 90.4 01/25/2016 0912   MCH 30.6 04/29/2017 0755   MCHC 32.6 04/29/2017 0755   RDW 14.0 04/29/2017 0755   RDW 14.5 01/25/2016 0912   LYMPHSABS 0.3 (L) 05/04/2017 1015   LYMPHSABS 1.3 01/25/2016 0912   MONOABS 0.3 04/26/2017 1015   MONOABS 0.5 01/25/2016 0912   EOSABS 0.0 04/24/2017 1015   EOSABS 0.1 01/25/2016 0912   BASOSABS 0.0 05/03/2017 1015   BASOSABS 0.0 01/25/2016 0912    CMP     Component Value Date/Time   NA 142 04/29/2017 0755   NA 146 (H) 01/25/2016 0912   K 3.7 04/29/2017 0755   K 3.2 (L) 01/25/2016 0912   CL 113 (H) 04/29/2017 0755   CO2 12 (L) 04/29/2017 0755   CO2 27 01/25/2016 0912   GLUCOSE 73 04/29/2017 0755   GLUCOSE 159 (H) 01/25/2016 0912   GLUCOSE 147 (H) 04/15/2006 0921   BUN 36 (H) 04/29/2017 0755   BUN 23.0 01/25/2016 0912   CREATININE 2.41 (H) 04/29/2017 0755   CREATININE 2.8 (H) 01/25/2016 0912   CALCIUM 7.4 (L) 04/29/2017 0755   CALCIUM 9.0 01/25/2016 0912   PROT 3.7 (L) 04/29/2017 0755   PROT 6.3 (L) 01/25/2016 0912   ALBUMIN 1.6 (L) 04/29/2017 0755   ALBUMIN 2.7 (L) 01/25/2016 0912   AST 34 04/29/2017 0755   AST 65 (H) 01/25/2016 0912   ALT 9 (L) 04/29/2017 0755   ALT 29 01/25/2016 0912   ALKPHOS 124 04/29/2017 0755   ALKPHOS 140 01/25/2016 0912   BILITOT 0.5 04/29/2017 0755   BILITOT 0.53 01/25/2016 0912   GFRNONAA 25 (L) 04/29/2017 0755   GFRAA 28 (L) 04/29/2017 0755  EEG:  This is an abnormal EEG, but sub-optimal in quality due to muscle tension.  There is moderate generalized slowing of brain activity, which is non-specific, but may be due to toxic, metabolic, infectious, or hypoxic etiologies.  The patient is not in non-convulsive status epilepticus.  LTM EEG Clinical interpretation: This 19 hours of intensive EEG monitoring was simultaneously monitored and recorded to clinical  and electrographic seizures.  Clinically seizures were marked by facial twitching.  electrographically seizures lateralized to the left hemisphere localizing to the left anterior frontal cortex.  Interictal EEG was marked by fairly frequent left fronto polar sharp waves and spikes was brought negative field suggestive of cortical irritability in the left anterior frontal cortex.  Clinical correlation is advised.   Imaging I have reviewed images in epic and the results pertinent to this consultation are: CT-scan of the brain - no acute changes  Assessment:  76/M presenting with altered mental status. Noted to have glucose 39 on arrival.  Initial suspicion for provoked seizure in the setting of hypoglycemia.  Also has question of  GI bleed which might be contributing to altered mental status. Spot EEG unremarkable. 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 He was already started on Depakote.  We will add another antiepileptic as below.  Impression: Seizure - ?provoked in setting of systemic illness and hypoglycemia Toxic metabolic encephalopathy  Recommendations: C/w Depakote Add Keppra 500 BID after a 1g IV load due to continuing electrographic and clinical seizure activity. Seizure precautions MRI when possible after EEG leads off. Correct tox/met derangements C/W LTM EEG for now.  -- Amie Portland, MD [facility] Pager: [phone] If 7pm to 7am, please call on call as listed on AMION.  CRITICAL CARE ATTESTATION This patient is critically ill and at significant risk of neurological worsening, death and care requires constant monitoring of vital signs, hemodynamics,respiratory and cardiac monitoring. I spent 30  minutes of neurocritical care time performing neurological assessment, discussion with family, other specialists and medical decision making of high complexityin the care of  this patient.

## 2017-04-29 NOTE — Procedures (Signed)
  Electroencephalogram report- LTM   Data acquisition: 10-20 electrode placement.  Additional T1, T2, and EKG electrodes; 26 channel digital referential acquisition reformatted to 18 channel/7 channel coronal bipolar     Spike detection: ON     Seizure detection: ON   Beginning time: 04/28/2017 at 1353 Ending time: 04/29/2017 at 8:05 AM  CPT: 95951 Day of study: Day 1   This 18  hours of intensive EEG monitoring with simultaneous video monitoring was performed for this patient with facial twitching to rule out clinical and subclinical electrographic seizures.   Medications: As per EMR  There was to push button activation events during this recording activated by staff at 1725 as well as 2159.  On both occasions patient has subtle facial twitching lateralization of facial twitching clear to ascertain.  However electrographically there is a obvious electrographic seizure noted across left hemisphere maximum negativity left frontopolar and frontal central region and remain confined to the left hemisphere.  On the right side there is a muscle artifact due to facial twitching noted.  Throughout the recording there is a occasionally sharp waves and spikes was brought negative field.  Sharp waves and spikes present with brought negative field.  Clinical interpretation: This 19 hours of intensive EEG monitoring was simultaneously monitored and recorded to clinical and electrographic seizures.  Clinically seizures were marked by facial twitching.  electrographically seizures lateralized to the left hemisphere localizing to the left anterior frontal cortex.  Interictal EEG was marked by fairly frequent left fronto polar sharp waves and spikes was brought negative field suggestive of cortical irritability in the left anterior frontal cortex.  Clinical correlation is advised.

## 2017-04-30 ENCOUNTER — Other Ambulatory Visit: Payer: Self-pay

## 2017-04-30 ENCOUNTER — Inpatient Hospital Stay (HOSPITAL_COMMUNITY): Payer: Medicare Other

## 2017-04-30 LAB — GLUCOSE, CAPILLARY
GLUCOSE-CAPILLARY: 152 mg/dL — AB (ref 65–99)
Glucose-Capillary: 128 mg/dL — ABNORMAL HIGH (ref 65–99)
Glucose-Capillary: 130 mg/dL — ABNORMAL HIGH (ref 65–99)
Glucose-Capillary: 132 mg/dL — ABNORMAL HIGH (ref 65–99)
Glucose-Capillary: 115 mg/dL — ABNORMAL HIGH (ref 65–99)
Glucose-Capillary: 167 mg/dL — ABNORMAL HIGH (ref 65–99)

## 2017-04-30 LAB — COMPREHENSIVE METABOLIC PANEL
ALT: 10 U/L — AB (ref 17–63)
AST: 32 U/L (ref 15–41)
Albumin: 1.4 g/dL — ABNORMAL LOW (ref 3.5–5.0)
Alkaline Phosphatase: 71 U/L (ref 38–126)
Anion gap: 14 (ref 5–15)
BUN: 38 mg/dL — ABNORMAL HIGH (ref 6–20)
CHLORIDE: 117 mmol/L — AB (ref 101–111)
CO2: 15 mmol/L — AB (ref 22–32)
CREATININE: 2.38 mg/dL — AB (ref 0.61–1.24)
Calcium: 7.7 mg/dL — ABNORMAL LOW (ref 8.9–10.3)
GFR calc Af Amer: 29 mL/min — ABNORMAL LOW (ref >60)
GFR, EST NON AFRICAN AMERICAN: 25 mL/min — AB (ref >60)
GLUCOSE: 122 mg/dL — AB (ref 65–99)
POTASSIUM: 4 mmol/L (ref 3.5–5.1)
SODIUM: 146 mmol/L — AB (ref 135–145)
Total Bilirubin: 0.6 mg/dL (ref 0.3–1.2)
Total Protein: 3.9 g/dL — ABNORMAL LOW (ref 6.5–8.1)

## 2017-04-30 LAB — CBC
HCT: 16.4 % — ABNORMAL LOW (ref 39.0–52.0)
HEMOGLOBIN: 5.7 g/dL — AB (ref 13.0–17.0)
MCH: 31.8 pg (ref 26.0–34.0)
MCHC: 34.8 g/dL (ref 30.0–36.0)
MCV: 91.6 fL (ref 78.0–100.0)
Platelets: 136 10*3/uL — ABNORMAL LOW (ref 150–400)
RBC: 1.79 MIL/uL — AB (ref 4.22–5.81)
RDW: 14 % (ref 11.5–15.5)
WBC: 9.2 10*3/uL (ref 4.0–10.5)

## 2017-04-30 LAB — CULTURE, RESPIRATORY: CULTURE: NORMAL

## 2017-04-30 LAB — CULTURE, RESPIRATORY W GRAM STAIN: Special Requests: NORMAL

## 2017-04-30 LAB — OCCULT BLOOD X 1 CARD TO LAB, STOOL: FECAL OCCULT BLD: NEGATIVE

## 2017-04-30 LAB — PREPARE RBC (CROSSMATCH)

## 2017-04-30 LAB — HEMOGLOBIN AND HEMATOCRIT, BLOOD
HCT: 24.4 % — ABNORMAL LOW (ref 39.0–52.0)
HEMOGLOBIN: 8.4 g/dL — AB (ref 13.0–17.0)

## 2017-04-30 MED ORDER — FAMOTIDINE 40 MG/5ML PO SUSR
20.0000 mg | Freq: Every day | ORAL | Status: DC
  Administered 2017-04-30 – 2017-05-03 (×4): 20 mg
  Filled 2017-04-30 (×4): qty 2.5

## 2017-04-30 MED ORDER — SODIUM CHLORIDE 0.9 % IV SOLN
Freq: Once | INTRAVENOUS | Status: AC
  Administered 2017-04-30: 10:00:00 via INTRAVENOUS

## 2017-04-30 MED ORDER — SODIUM BICARBONATE 8.4 % IV SOLN
INTRAVENOUS | Status: DC
  Administered 2017-04-30 – 2017-05-03 (×4): via INTRAVENOUS
  Filled 2017-04-30 (×6): qty 150

## 2017-04-30 NOTE — Progress Notes (Signed)
PULMONARY / CRITICAL CARE MEDICINE   Name: [name] MRN: [id number] DOB: October 30, 1940    ADMISSION DATE:  05/01/2017 CONSULTATION DATE:  1/19  REFERRING MD:  ER  CHIEF COMPLAINT:  AMS  HISTORY OF PRESENT ILLNESS:    This 77 y.o. African American male presented to the ER with altered mental status. He was unresponsive but with pulse and respirations. Due to concerns for inability to protect his airway, he was intubated in the ER. The patient's last known well time was last night when he turned in to sleep. The patient's wife is at the bedside and is able to provide some additional helpful history. She believes that the patient was "alrigth" last night, but she went to sleep first. She notes that the patient has been waking up later and later in the mornings and was determined to make him wake up to eat breakfast today. She tried to wake him up at around 0900 but to no avail. She called EMS.  Of note, the patient is known to be diabetic, for which he takes PO diabetic medications. He has not been eating well. He has a history of achalasia and underwent esophageal dilatation around 04/22/2017 but apparently has continued to have modest dietary intake (he is typically limited to a liquid diet due to achalasia). His glucose in the ER was 39. He was given D50 with appropriate increase in his glucose level but his mental status has not improved.  This patient is critically ill and cannot provide additional history due to Unconsciousness and Ventilated.    PAST MEDICAL HISTORY :  He  has a past medical history of Anemia, Blood clot in vein (2008), Cataract, CKD (chronic kidney disease) (02/2016), Diabetes mellitus, type 2 ([place]) (2008), GERD (gastroesophageal reflux disease), Hyperlipidemia (2008), Hypertension (2008), Pneumonia (01/18/2016), and Prostate cancer ([place]) (2006).  PAST SURGICAL HISTORY: He  has a past surgical history that includes Colonoscopy w/ polypectomy (03/2008); Esophageal  manometry (1984); Esophagogastroduodenoscopy (N/A, 02/17/2016); Esophagogastroduodenoscopy (egd) with propofol (N/A, 02/21/2016); Esophagogastroduodenoscopy (egd) with propofol (N/A, 02/28/2016); Esophagogastroduodenoscopy (egd) with propofol (N/A, 04/13/2016); Botox injection (N/A, 04/13/2016); Balloon dilation (N/A, 04/13/2016); LEFT ARM IMPLANT X2; Esophagogastroduodenoscopy (egd) with propofol (N/A, 01/02/2017); Botox injection (N/A, 01/02/2017); Esophagogastroduodenoscopy (N/A, 04/22/2017); and Balloon dilation (N/A, 04/22/2017).  No Known Allergies  No current facility-administered medications on file prior to encounter.    Current Outpatient Medications on File Prior to Encounter  Medication Sig  . Amino Acids-Protein Hydrolys (FEEDING SUPPLEMENT, PRO-STAT SUGAR FREE 64,) LIQD Take 30 mLs by mouth 2 (two) times daily. (Patient taking differently: Take 30 mLs by mouth daily. )  . atorvastatin (LIPITOR) 10 MG tablet Take 1 tablet (10 mg total) by mouth at bedtime.  . docusate sodium (COLACE) 100 MG capsule TAKE ONE CAPSULE BY MOUTH TWICE A DAY FOR CONSTIPATION (Patient taking differently: TAKE ONE CAPSULE (10 MG) BY MOUTH TWICE A DAY FOR CONSTIPATION)  . enzalutamide (XTANDI) 40 MG capsule Take 160 mg by mouth daily.  . feeding supplement, ENSURE ENLIVE, (ENSURE ENLIVE) LIQD Take 237 mLs by mouth 2 (two) times daily between meals.  . ferrous sulfate 325 (65 FE) MG tablet TAKE 1 TABLET BY MOUTH EVERY DAY FOR SUPPLEMENT (Patient taking differently: 325 mg. TAKE 1 TABLET BY MOUTH EVERY DAY FOR SUPPLEMENT)  . furosemide (LASIX) 80 MG tablet Take 0.5 tablets (40 mg total) by mouth daily.  Marland Kitchen glipiZIDE (GLUCOTROL XL) 5 MG 24 hr tablet Take 5 mg by mouth daily with breakfast.  . metoprolol tartrate (LOPRESSOR)  25 MG tablet Take 0.5 tablets (12.5 mg total) by mouth 2 (two) times daily.  . Multiple Vitamin (MULTIVITAMIN WITH MINERALS) TABS tablet Take 1 tablet by mouth daily.  . ondansetron (ZOFRAN-ODT) 4 MG  disintegrating tablet Take 1 tablet (4 mg total) by mouth every 8 (eight) hours as needed for nausea or vomiting.  . pantoprazole (PROTONIX) 40 MG tablet TAKE 1 TABLET TWICE A DAY (Patient taking differently: TAKE 1 TABLET (40 MG) TWICE A DAY)  . pioglitazone (ACTOS) 15 MG tablet Take 1 tablet (15 mg total) daily by mouth.  . TRADJENTA 5 MG TABS tablet TAKE 1 TABLET (5 MG TOTAL) BY MOUTH DAILY.  Marland Kitchen ACCU-CHEK FASTCLIX LANCETS MISC Use to check blood sugars twice daily E11.65  . ACCU-CHEK FASTCLIX LANCETS MISC USE TO CHECK BLOOD SUGARS TWICE DAILY E11.65  . glucose blood test strip Use as instructed    FAMILY HISTORY:  His indicated that his mother is deceased. He indicated that his father is deceased. He indicated that the status of his sister is unknown. He indicated that the status of his brother is unknown. He indicated that the status of his neg hx is unknown.   SOCIAL HISTORY: He  reports that he quit smoking about 19 years ago. He quit after 10.00 years of use. he has never used smokeless tobacco. He reports that he does not drink alcohol or use drugs.  REVIEW OF SYSTEMS:   unable to obtain due to patient's altered mental status and intubated state.     SUBJECTIVE:   No acute events.  Pt still not following any commands     VITAL SIGNS: BP 119/64   Pulse 82   Temp (!) 97.5 F (36.4 C)   Resp 13   Wt 148 lb 13 oz (67.5 kg)   SpO2 100%   BMI 22.63 kg/m   HEMODYNAMICS:    VENTILATOR SETTINGS: Vent Mode: PRVC FiO2 (%):  [40 %] 40 % Set Rate:  [14 bmp] 14 bmp Vt Set:  [500 mL] 500 mL PEEP:  [5 cmH20] 5 cmH20 Plateau Pressure:  [10 cmH20-16 cmH20] 10 cmH20  INTAKE / OUTPUT: I/O last 3 completed shifts: In: 35 [I.V.:1243; NG/GT:2040; IV Piggyback:771] Out: 650 [Urine:650]  PHYSICAL EXAMINATION: General:  Sedated, intubated,  Neuro: same as above, does not open eyes to command or sternal rubs  HEENT:  Ncat, mmm, right pupil reactive to light  Cardiovascular:   RRR, no mrg Lungs:  Symmetric, coarse rhonchi, and some crackles  Abdomen:  Soft , nondistended, normal bowel sounds  Musculoskeletal:  2+ DP pulses, does not spontaneously move extremities. Good muscle tone  Skin:  No obvious rashes, skin intact   LABS:  BMET Recent Labs  Lab 04/28/17 0247 04/29/17 0755 04/30/17 0504  NA 139 142 146*  K 4.3 3.7 4.0  CL 108 113* 117*  CO2 17* 12* 15*  BUN 27* 36* 38*  CREATININE 2.08* 2.41* 2.38*  GLUCOSE 85 73 122*    Electrolytes Recent Labs  Lab 04/28/17 0247  04/28/17 1608 04/29/17 0609 04/29/17 0755 04/29/17 1631 04/30/17 0504  CALCIUM 7.8*  --   --   --  7.4*  --  7.7*  MG 1.4*   < > 1.7 1.7  --  1.7  --   PHOS 4.2   < > 4.3 4.0  --  3.7  --    < > = values in this interval not displayed.    CBC Recent Labs  Lab 04/28/17 0247 04/29/17  0755 04/30/17 0738  WBC 8.6 16.1* 9.2  HGB 8.2* 7.1* 5.7*  HCT 24.6* 21.8* 16.4*  PLT 189 165 136*    Coag's Recent Labs  Lab 04/24/2017 1015  INR 0.93    Sepsis Markers Recent Labs  Lab 04/28/17 1116 04/28/17 1439 04/28/17 1608  LATICACIDVEN 3.3* 2.5* 2.7*    ABG Recent Labs  Lab 04/10/2017 1015 05/05/2017 1530 04/28/17 0345  PHART 7.525* 7.503* 7.471*  PCO2ART 24.6* 24.9* 27.8*  PO2ART 58.0* 280* 98.8    Liver Enzymes Recent Labs  Lab 05/03/2017 1015 04/29/17 0755 04/30/17 0504  AST 28 34 32  ALT 12* 9* 10*  ALKPHOS 83 124 71  BILITOT 0.7 0.5 0.6  ALBUMIN 2.1* 1.6* 1.4*    Cardiac Enzymes Recent Labs  Lab 04/12/2017 1015  TROPONINI <0.03    Glucose Recent Labs  Lab 04/29/17 1147 04/29/17 1603 04/29/17 1945 04/29/17 2322 04/30/17 0323 04/30/17 0854  GLUCAP 116* 113* 115* 143* 128* 130*    Imaging Dg Chest Port 1 View  Result Date: 04/30/2017 CLINICAL DATA:  Respiratory failure.  Achalasia EXAM: PORTABLE CHEST 1 VIEW COMPARISON:  04/28/2017 FINDINGS: Endotracheal tube in good position. NG coiled in very dilated distal esophagus Bibasilar airspace  disease with mild progression. Small bilateral effusions. IMPRESSION: Endotracheal tube in good position. NG coiled in the dilated esophagus Bibasilar airspace disease with mild progression, possible pneumonia Electronically Signed   By: Franchot Gallo M.D.   On: 04/30/2017 06:43     STUDIES:  CT head 1/19 IMPRESSION: No acute intracranial abnormality.  Mild parenchymal atrophy and moderate chronic microvascular disease.  CXR 1/19 IMPRESSION: Endotracheal tube tip 3.6 cm above carina. No pneumothorax. There is now hazy opacity throughout much of the right mid and lower lung zones. Question developing pneumonia or atypical edema. There has been interval clearing of presumed atelectasis from the medial left base. Heart size normal.   CULTURES: Blood Culture Urine culture 1/20 > Tracheal aspirate culture 1/20 > pending , gram stain below  MODERATE WBC PRESENT, PREDOMINANTLY PMN  FEW GRAM POSITIVE RODS  FEW GRAM POSITIVE COCCI IN CLUSTERS  RARE GRAM NEGATIVE RODS   Blood culture 1/19 >  MRSA PCR positive     ANTIBIOTICS: Zosyn 1/19 >> Vancomycin 1/19 >>    SIGNIFICANT EVENTS: 1/19 found comatose at home --> ER --> intubated    LINES/TUBES: ETT 1/19 > Foley 1/19 >  DISCUSSION:  77 yo man with DM, achalasia, with recent esophageal dilation on 1/14, CKD3-4,  DVT in 2008, , HTN, hLD, who presents after unable to be awakened by his wife, and glucose of 39 in the ER> currently intubated, and ?seizure/postictal   ASSESSMENT / PLAN:  PULMONARY A: Acute hypoxic respiratory failure Possible aspiration PNA  P:   Currently intubated  PRVC , rate 12, tidal volume 8 ml/kg (560)  IBW FiO2 40%, PEEP 5, plateau 15  -daily spontaneous breathing trial if pt meets weaning criteria   -empiric vanc and zosyn for aspiration PNA  CARDIOVASCULAR A:  Prior echo shows G2DD, and elevated PA pressure, at home takes metoprolol History of HTN, HLD   P:  -order echo    RENAL A:   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  DVT ppx SCDs No acute issues, gastrooccult positive , but Ct head neg for hemorrhage  P:  -transfuse 2 units , follow up h&H -monitor CBC _SCDs -transfusion threshold HgB of 7   INFECTIOUS A:   Sepsis due to possible aspiration pna  aspiration PNA. CXR 1/19 showing hazy opacity in right mid and lower lung zones.   P:    -vanc and zosyn per pharmacy     ENDOCRINE A:   Hypoglycemia presentation in the ER, with history of diabetes controlled by oral medications at home- glipizide, actos, and tradjenta   Glucose of 39 in the ER. Glucose improved with D10, but there was no neurologic improvement.  Overnight, BG has been 122-130. Received 1 units of SSI.   P:   -on SSI q4 hours  -continue D5 NS at with bicarb   NEUROLOGIC A:    Sedated and intubated, propofol has been off  CT head neg for hemorrhage   Encephalopathy due to hypoglycemia (prolonged?- as he is on 3 oral diabetes meds at home), and possible seizures- neurology was consulted.  Per neurology,  overall presentation consistent with diffuse cortical injury secondary to prolonged hypoglycemia,  Large brainstem stroke is also possible but lower on the differential.  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    Hypomag and Hypok- were repleted yest as they can also lower zeisure threshold   P:   -neurology following and  appreciate recs , currently doing LTM EEG from spot EEG  -valproic acid per neurology, keppra added  -seizure precautions  -MRI when able  RASS goal: 0,-1 Daily weaning trials if meets criteria    FAMILY  - Updates:   - Inter-disciplinary family meet or Palliative Care meeting due by:  1/25    Signed  Garnet Sierras  Pulmonary and [place] Pager: [phone]  04/30/2017, 10:15 AM

## 2017-04-30 NOTE — Progress Notes (Signed)
Subjective: Intubated breathing over the vent but follows no command and only sithdraws versus decorticate posturing with pain to fingers.   Exam: Vitals:   04/30/17 0956 04/30/17 1000  BP:  119/64  Pulse: 81 82  Resp: 12 13  Temp: (!) 97.5 F (36.4 C) (!) 97.5 F (36.4 C)  SpO2: 100% 100%    Physical Exam   HEENT-  Normocephalic, no lesions, without obvious abnormality.  Normal external eye and conjunctiva with edema.   Cardiovascular- S1-S2 audible, pulses palpable throughout   Lungs-no rhonchi or wheezing noted, no excessive working breathing.  Saturations within normal limits Abdomen- All 4 quadrants palpated and nontender Extremities- Warm, dry and intact Musculoskeletal-no joint tenderness, deformity or swelling Skin-warm and dry, no hyperpigmentation, vitiligo, or suspicious lesions  Neuro:  Mental Status: Patient does not respond to verbal stimuli.  Does not respond to deep sternal rub.  Does not follow commands.  Intubated breathing over vent  Cranial Nerves: II: patient does not respond confrontation bilaterally, pupils right 2 mm, left childhood cataract and unresponsive mm, III,IV,VI: doll's response absent bilaterally.  V,VII: corneal reflex present bilaterally  VIII: patient does not respond to verbal stimuli IX,X: gag reflex present, XI: trapezius strength unable to test bilaterally XII: tongue strength unable to test Motor: Extremities flaccid throughout.  No spontaneous movement noted.  No purposeful movements noted. As noted above some UE movement with pain Sensory: As above Deep Tendon Reflexes:  hyporeactive throughout. Plantars: Triple flexion bilaterally    Medications:  Scheduled: . chlorhexidine gluconate (MEDLINE KIT)  15 mL Mouth Rinse BID  . Chlorhexidine Gluconate Cloth  6 each Topical Q0600  . famotidine  20 mg Per Tube Daily  . insulin aspart  0-9 Units Subcutaneous Q4H  . mouth rinse  15 mL Mouth Rinse QID  . mupirocin ointment  1  application Nasal BID  . sodium chloride flush  3 mL Intravenous Q12H   Continuous: . sodium chloride 250 mL (04/30/17 0400)  . sodium chloride    . sodium chloride    . feeding supplement (VITAL AF 1.2 CAL) 1,000 mL (04/30/17 0725)  . levETIRAcetam Stopped (04/30/17 0517)  . magnesium sulfate 1 - 4 g bolus IVPB    . piperacillin-tazobactam Stopped (04/30/17 0900)  .  sodium bicarbonate  infusion 1000 mL    . valproate sodium Stopped (04/30/17 6045)  . vancomycin Stopped (04/29/17 1400)   WUJ:WJXBJY chloride, sodium chloride, acetaminophen, midazolam, sodium chloride flush  Pertinent Labs/Diagnostics: RBC 1.79 Hg 5.7 HCT 16.4 PLTS 136 NA 146 BUN 38 Cr 2.38 Ca 7.7  Dg Chest Port 1 View  Result Date: 04/30/2017 CLINICAL DATA:  Respiratory failure.  Achalasia EXAM: PORTABLE CHEST 1 VIEW COMPARISON:  04/28/2017 FINDINGS: Endotracheal tube in good position. NG coiled in very dilated distal esophagus Bibasilar airspace disease with mild progression. Small bilateral effusions. IMPRESSION: Endotracheal tube in good position. NG coiled in the dilated esophagus Bibasilar airspace disease with mild progression, possible pneumonia Electronically Signed   By: Franchot Gallo M.D.   On: 04/30/2017 06:43   Etta Quill PA-C [facility] [phone]  Impression: seizure--possible provoked in setting of hypoglycemia and other multiple medical comorbidities.. Currently on Keppra 500 mg BID.  LTM EEG report available now-no clinical or subclinical seizures seen over the past 24 hours.  IMPRESSION -Most likely provoked seizures in the setting of hypoglycemia and other medical comorbidities -Toxic metabolic encephalopathy   Recommendations: 1) discontinue LTM 2) MRI brain when possible.  Call neurology if reported abnormal.  3) Continue Keppra at current dose 4) correction of toxic metabolic derangements per primary team.  Currently anemic receiving blood transfusions.  Neurology  will be available as needed.  Please call us with questions.  04/30/2017, 10:17 AM   Attending Neurohospitalist Addendum Patient seen and examined with APP/Resident. Agree with the history and physical as documented above. Agree with the plan as documented, which I helped formulate. I have independently reviewed the chart, obtained history, review of systems and examined the patient.I have personally reviewed pertinent head/neck/spine imaging (CT/MRI). Please feel free to call with any questions. --- Amie Portland, MD [facility] Pager: [phone]  If 7pm to 7am, please call on call as listed on AMION.   CRITICAL CARE ATTESTATION This patient is critically ill and at significant risk of neurological worsening, death and care requires constant monitoring of vital signs, hemodynamics,respiratory and cardiac monitoring. I spent 35  minutes of neurocritical care time performing neurological assessment, discussion with family, other specialists and medical decision making of high complexityin the care of  this patient.

## 2017-04-30 NOTE — Procedures (Signed)
Procedures: PR EEG MONITORING/VIDEORECORD [id number] (Type: CPT(R))]          Electroencephalogram report- LTM   Data acquisition: 10-20 electrode placement.  Additional T1, T2, and EKG electrodes; 26 channel digital referential acquisition reformatted to 18 channel/7 channel coronal bipolar     Spike detection: ON     Seizure detection: ON   Beginning time: 04/29/2017 at 8 05 am  Ending time: 04/30/2017 at 8:33 AM  CPT: 70962 Day of study: Day 2   This  intensive EEG monitoring with simultaneous video monitoring was performed for this patient with facial twitching to rule out clinical and subclinical electrographic seizures.   Medications: As per EMR   day 1:  There were  push button activation events during this recording activated by staff at 1725 as well as 2159.  On both occasions patient has subtle facial twitching lateralization of facial twitching clear to ascertain.  However electrographically there is a obvious electrographic seizure noted across left hemisphere maximum negativity left frontopolar and frontal central region and remain confined to the left hemisphere.  On the right side there is a muscle artifact due to facial twitching noted.  Throughout the recording there is a occasionally sharp waves and spikes was brought negative field.    Sharp waves and spikes present with brought negative field.  Day 2: No pushbutton activation events during this recording.  No clinical subclinical seizures.  Background activity is more by background activity slowing with superimposed occasionally left frontotemporal sharp waves.  Clinical interpretation: This  2 of intensive EEG monitoring was simultaneously monitored did not record any clinical subclinical seizures.  Background activity is marked by background activity slowing with superimposed sharp waves in the left frontotemporal region.  Clinical correlation is advised.

## 2017-04-30 NOTE — Progress Notes (Signed)
LTM discontinued; mild skin breakdown at Fp1 and F7. Nurse made aware. Marland Kitchen

## 2017-05-01 ENCOUNTER — Inpatient Hospital Stay (HOSPITAL_COMMUNITY): Payer: Medicare Other

## 2017-05-01 ENCOUNTER — Other Ambulatory Visit: Payer: Medicare Other

## 2017-05-01 ENCOUNTER — Ambulatory Visit: Payer: Medicare Other | Admitting: Oncology

## 2017-05-01 DIAGNOSIS — G9341 Metabolic encephalopathy: Secondary | ICD-10-CM

## 2017-05-01 LAB — TYPE AND SCREEN
ABO/RH(D): B POS
Antibody Screen: NEGATIVE
UNIT DIVISION: 0
Unit division: 0

## 2017-05-01 LAB — CBC
HCT: 24.4 % — ABNORMAL LOW (ref 39.0–52.0)
HEMOGLOBIN: 8.6 g/dL — AB (ref 13.0–17.0)
MCH: 30.5 pg (ref 26.0–34.0)
MCHC: 35.2 g/dL (ref 30.0–36.0)
MCV: 86.5 fL (ref 78.0–100.0)
Platelets: 140 10*3/uL — ABNORMAL LOW (ref 150–400)
RBC: 2.82 MIL/uL — AB (ref 4.22–5.81)
RDW: 16.1 % — ABNORMAL HIGH (ref 11.5–15.5)
WBC: 6.4 10*3/uL (ref 4.0–10.5)

## 2017-05-01 LAB — PHOSPHORUS: Phosphorus: 2.3 mg/dL — ABNORMAL LOW (ref 2.5–4.6)

## 2017-05-01 LAB — GLUCOSE, CAPILLARY
GLUCOSE-CAPILLARY: 88 mg/dL (ref 65–99)
GLUCOSE-CAPILLARY: 117 mg/dL — AB (ref 65–99)
Glucose-Capillary: 154 mg/dL — ABNORMAL HIGH (ref 65–99)
Glucose-Capillary: 152 mg/dL — ABNORMAL HIGH (ref 65–99)
Glucose-Capillary: 169 mg/dL — ABNORMAL HIGH (ref 65–99)

## 2017-05-01 LAB — BPAM RBC
BLOOD PRODUCT EXPIRATION DATE: 201902132359
Blood Product Expiration Date: 201902122359
ISSUE DATE / TIME: 201901221001
ISSUE DATE / TIME: 201901221233
UNIT TYPE AND RH: 7300
Unit Type and Rh: 7300

## 2017-05-01 LAB — BASIC METABOLIC PANEL
ANION GAP: 15 (ref 5–15)
ANION GAP: 13 (ref 5–15)
BUN: 36 mg/dL — ABNORMAL HIGH (ref 6–20)
BUN: 39 mg/dL — ABNORMAL HIGH (ref 6–20)
CALCIUM: 7.8 mg/dL — AB (ref 8.9–10.3)
CALCIUM: 7.8 mg/dL — AB (ref 8.9–10.3)
CO2: 16 mmol/L — AB (ref 22–32)
CO2: 18 mmol/L — AB (ref 22–32)
CREATININE: 2.11 mg/dL — AB (ref 0.61–1.24)
Chloride: 114 mmol/L — ABNORMAL HIGH (ref 101–111)
Chloride: 114 mmol/L — ABNORMAL HIGH (ref 101–111)
Creatinine, Ser: 2.13 mg/dL — ABNORMAL HIGH (ref 0.61–1.24)
GFR calc non Af Amer: 28 mL/min — ABNORMAL LOW (ref >60)
GFR, EST AFRICAN AMERICAN: 33 mL/min — AB (ref >60)
GFR, EST AFRICAN AMERICAN: 33 mL/min — AB (ref >60)
GFR, EST NON AFRICAN AMERICAN: 29 mL/min — AB (ref >60)
Glucose, Bld: 111 mg/dL — ABNORMAL HIGH (ref 65–99)
Glucose, Bld: 182 mg/dL — ABNORMAL HIGH (ref 65–99)
Potassium: 2.5 mmol/L — CL (ref 3.5–5.1)
Potassium: 2.8 mmol/L — ABNORMAL LOW (ref 3.5–5.1)
SODIUM: 145 mmol/L (ref 135–145)
SODIUM: 145 mmol/L (ref 135–145)

## 2017-05-01 LAB — MAGNESIUM: MAGNESIUM: 1.7 mg/dL (ref 1.7–2.4)

## 2017-05-01 MED ORDER — POTASSIUM & SODIUM PHOSPHATES 280-160-250 MG PO PACK
1.0000 | PACK | Freq: Two times a day (BID) | ORAL | Status: AC
  Administered 2017-05-01 (×2): 1
  Filled 2017-05-01 (×2): qty 1

## 2017-05-01 MED ORDER — POTASSIUM CHLORIDE 20 MEQ/15ML (10%) PO SOLN: 40.0000 meq | ORAL | Status: DC

## 2017-05-01 MED ORDER — POTASSIUM CHLORIDE 20 MEQ/15ML (10%) PO SOLN
40.0000 meq | ORAL | Status: AC
  Administered 2017-05-01 (×2): 40 meq
  Filled 2017-05-01 (×2): qty 30

## 2017-05-01 MED ORDER — POTASSIUM CHLORIDE 20 MEQ/15ML (10%) PO SOLN
40.0000 meq | Freq: Once | ORAL | Status: AC
  Administered 2017-05-01: 40 meq
  Filled 2017-05-01: qty 30

## 2017-05-01 MED ORDER — MAGNESIUM SULFATE 2 GM/50ML IV SOLN
2.0000 g | Freq: Once | INTRAVENOUS | Status: AC
  Administered 2017-05-01: 2 g via INTRAVENOUS
  Filled 2017-05-01: qty 50

## 2017-05-01 MED ORDER — POTASSIUM CHLORIDE 20 MEQ/15ML (10%) PO SOLN
20.0000 meq | Freq: Once | ORAL | Status: AC
  Administered 2017-05-01: 20 meq
  Filled 2017-05-01: qty 15

## 2017-05-01 NOTE — Progress Notes (Signed)
PULMONARY / CRITICAL CARE MEDICINE   Name: [name] MRN: [id number] DOB: 1940-12-23    ADMISSION DATE:  04/12/2017 CONSULTATION DATE:  1/19  REFERRING MD:  ER  CHIEF COMPLAINT:  AMS  HISTORY OF PRESENT ILLNESS:   This 77 y.o. African American male presented to the ER with altered mental status. He was unresponsive but with pulse and respirations. Due to concerns for inability to protect his airway, he was intubated in the ER. The patient's last known well time was last night when he turned in to sleep. The patient's wife is at the bedside and is able to provide some additional helpful history. She believes that the patient was "alright" last night, but she went to sleep first. She notes that the patient has been waking up later and later in the mornings and was determined to make him wake up to eat breakfast today. She tried to wake him up at around 0900 but to no avail. So she called EMS.  Of note, the patient is known to be diabetic, for which he takes PO diabetic medications. He has not been eating well. He has a history of achalasia and underwent esophageal dilatation around 04/22/2017 but apparently has continued to have modest dietary intake (he is typically limited to a liquid diet due to achalasia). His glucose in the ER was 39. He was given D50 with appropriate increase in his glucose level but his mental status has not improved.  This patient is critically ill and cannot provide additional history due to Unconsciousness and Ventilated.   PAST MEDICAL HISTORY :  He  has a past medical history of Anemia, Blood clot in vein (2008), Cataract, CKD (chronic kidney disease) (02/2016), Diabetes mellitus, type 2 ([place]) (2008), GERD (gastroesophageal reflux disease), Hyperlipidemia (2008), Hypertension (2008), Pneumonia (01/18/2016), and Prostate cancer ([place]) (2006).  PAST SURGICAL HISTORY: He  has a past surgical history that includes Colonoscopy w/ polypectomy (03/2008); Esophageal  manometry (1984); Esophagogastroduodenoscopy (N/A, 02/17/2016); Esophagogastroduodenoscopy (egd) with propofol (N/A, 02/21/2016); Esophagogastroduodenoscopy (egd) with propofol (N/A, 02/28/2016); Esophagogastroduodenoscopy (egd) with propofol (N/A, 04/13/2016); Botox injection (N/A, 04/13/2016); Balloon dilation (N/A, 04/13/2016); LEFT ARM IMPLANT X2; Esophagogastroduodenoscopy (egd) with propofol (N/A, 01/02/2017); Botox injection (N/A, 01/02/2017); Esophagogastroduodenoscopy (N/A, 04/22/2017); and Balloon dilation (N/A, 04/22/2017).  No Known Allergies  No current facility-administered medications on file prior to encounter.    Current Outpatient Medications on File Prior to Encounter  Medication Sig  . Amino Acids-Protein Hydrolys (FEEDING SUPPLEMENT, PRO-STAT SUGAR FREE 64,) LIQD Take 30 mLs by mouth 2 (two) times daily. (Patient taking differently: Take 30 mLs by mouth daily. )  . atorvastatin (LIPITOR) 10 MG tablet Take 1 tablet (10 mg total) by mouth at bedtime.  . docusate sodium (COLACE) 100 MG capsule TAKE ONE CAPSULE BY MOUTH TWICE A DAY FOR CONSTIPATION (Patient taking differently: TAKE ONE CAPSULE (10 MG) BY MOUTH TWICE A DAY FOR CONSTIPATION)  . enzalutamide (XTANDI) 40 MG capsule Take 160 mg by mouth daily.  . feeding supplement, ENSURE ENLIVE, (ENSURE ENLIVE) LIQD Take 237 mLs by mouth 2 (two) times daily between meals.  . ferrous sulfate 325 (65 FE) MG tablet TAKE 1 TABLET BY MOUTH EVERY DAY FOR SUPPLEMENT (Patient taking differently: 325 mg. TAKE 1 TABLET BY MOUTH EVERY DAY FOR SUPPLEMENT)  . furosemide (LASIX) 80 MG tablet Take 0.5 tablets (40 mg total) by mouth daily.  Marland Kitchen glipiZIDE (GLUCOTROL XL) 5 MG 24 hr tablet Take 5 mg by mouth daily with breakfast.  . metoprolol tartrate (LOPRESSOR) 25  MG tablet Take 0.5 tablets (12.5 mg total) by mouth 2 (two) times daily.  . Multiple Vitamin (MULTIVITAMIN WITH MINERALS) TABS tablet Take 1 tablet by mouth daily.  . ondansetron (ZOFRAN-ODT) 4 MG  disintegrating tablet Take 1 tablet (4 mg total) by mouth every 8 (eight) hours as needed for nausea or vomiting.  . pantoprazole (PROTONIX) 40 MG tablet TAKE 1 TABLET TWICE A DAY (Patient taking differently: TAKE 1 TABLET (40 MG) TWICE A DAY)  . pioglitazone (ACTOS) 15 MG tablet Take 1 tablet (15 mg total) daily by mouth.  . TRADJENTA 5 MG TABS tablet TAKE 1 TABLET (5 MG TOTAL) BY MOUTH DAILY.  Marland Kitchen ACCU-CHEK FASTCLIX LANCETS MISC Use to check blood sugars twice daily E11.65  . ACCU-CHEK FASTCLIX LANCETS MISC USE TO CHECK BLOOD SUGARS TWICE DAILY E11.65  . glucose blood test strip Use as instructed    FAMILY HISTORY:  His indicated that his mother is deceased. He indicated that his father is deceased. He indicated that the status of his sister is unknown. He indicated that the status of his brother is unknown. He indicated that the status of his neg hx is unknown.   SOCIAL HISTORY: He  reports that he quit smoking about 19 years ago. He quit after 10.00 years of use. he has never used smokeless tobacco. He reports that he does not drink alcohol or use drugs.  REVIEW OF SYSTEMS:   unable to obtain due to patient's altered mental status and intubated state.  SUBJECTIVE:   No acute events.  Pt still not following any commands   VITAL SIGNS: BP 139/73   Pulse 68   Temp 98.6 F (37 C)   Resp 12   Wt 149 lb 11.1 oz (67.9 kg)   SpO2 100%   BMI 22.76 kg/m   HEMODYNAMICS:    VENTILATOR SETTINGS: Vent Mode: PRVC FiO2 (%):  [40 %] 40 % Set Rate:  [14 bmp] 14 bmp Vt Set:  [500 mL] 500 mL PEEP:  [5 cmH20] 5 cmH20 Plateau Pressure:  [10 cmH20-19 cmH20] 19 cmH20  INTAKE / OUTPUT: I/O last 3 completed shifts: In: 5290.3 [I.V.:1761.3; Blood:640; NG/GT:1960; IV Piggyback:929] Out: 1005 [Urine:1005]  PHYSICAL EXAMINATION: General:  Sedated, intubated, NAD Neuro: same as above, does not open eyes to command or sternal rubs  HEENT:  mmm, right pupil reactive to light  Cardiovascular:   RRR, no mrg Lungs:  Symmetric, coarse rhonchi, and some crackles stable   Abdomen:  Soft , nondistended, normal bowel sounds  Musculoskeletal:  2+ DP pulses, does not spontaneously move extremities. Good muscle tone  Skin:  No obvious rashes, skin intact   LABS:  BMET Recent Labs  Lab 04/29/17 0755 04/30/17 0504 05/01/17 0313  NA 142 146* 145  K 3.7 4.0 2.5*  CL 113* 117* 114*  CO2 12* 15* 16*  BUN 36* 38* 36*  CREATININE 2.41* 2.38* 2.13*  GLUCOSE 73 122* 111*   Electrolytes Recent Labs  Lab 04/29/17 0609 04/29/17 0755 04/29/17 1631 04/30/17 0504 05/01/17 0313  CALCIUM  --  7.4*  --  7.7* 7.8*  MG 1.7  --  1.7  --  1.7  PHOS 4.0  --  3.7  --  2.3*   CBC Recent Labs  Lab 04/29/17 0755 04/30/17 0738 04/30/17 1600 05/01/17 0313  WBC 16.1* 9.2  --  6.4  HGB 7.1* 5.7* 8.4* 8.6*  HCT 21.8* 16.4* 24.4* 24.4*  PLT 165 136*  --  140*   Coag's Recent  Labs  Lab 05/06/2017 1015  INR 0.93   Sepsis Markers Recent Labs  Lab 04/28/17 1116 04/28/17 1439 04/28/17 1608  LATICACIDVEN 3.3* 2.5* 2.7*   ABG Recent Labs  Lab 05/08/2017 1015 04/17/2017 1530 04/28/17 0345  PHART 7.525* 7.503* 7.471*  PCO2ART 24.6* 24.9* 27.8*  PO2ART 58.0* 280* 98.8   Liver Enzymes Recent Labs  Lab 04/28/2017 1015 04/29/17 0755 04/30/17 0504  AST 28 34 32  ALT 12* 9* 10*  ALKPHOS 83 124 71  BILITOT 0.7 0.5 0.6  ALBUMIN 2.1* 1.6* 1.4*   Cardiac Enzymes Recent Labs  Lab 05/08/2017 1015  TROPONINI <0.03   Glucose Recent Labs  Lab 04/30/17 0854 04/30/17 1119 04/30/17 1519 04/30/17 1919 04/30/17 2321 05/01/17 0400  GLUCAP 130* 152* 132* 115* 167* 88   Imaging No results found.  STUDIES:  CT head 1/19 IMPRESSION: No acute intracranial abnormality.  Mild parenchymal atrophy and moderate chronic microvascular disease.  CXR 1/19 IMPRESSION: Endotracheal tube tip 3.6 cm above carina. No pneumothorax. There is now hazy opacity throughout much of the right mid and  lower lung zones. Question developing pneumonia or atypical edema. There has been interval clearing of presumed atelectasis from the medial left base. Heart size normal.  CULTURES: Blood Culture Urine culture 1/20 >>> Tracheal aspirate culture 1/20 >>> gram stain consistent with respiratory flora  Blood culture 1/19 >>> MRSA PCR >>> positive   ANTIBIOTICS: Zosyn 1/19 >>>1/23 Vancomycin 1/19 >>>1/23  SIGNIFICANT EVENTS: 1/19 found comatose at home --> ER --> intubated  LINES/TUBES: ETT 1/19 >>> Foley 1/19 >>>  DISCUSSION: 76 yoM non-insulin dependent diabetic, achalasia, with recent esophageal dilation on 1/14, CKD3-4,  DVT in 2008, , HTN, HLD, who presents after being unable to be awakened by his wife, and noted to have a glucose of 39 in the ER> currently intubated, and ?seizure/postictal? Hypoglycemia?  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%, PA pressure 9mmHg, small pericardial effusion, mildly dilated Right atrium and ventricle  Home metoprolol held on admission History of HTN, HLD  P:  -continue telemetry  -consider restarting metoprolol if BP tolerates   RENAL A:   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, foley has minimal urine output and 500 cc over past 24 hours  Hypokalemia-2.5 on 1/23 P:   -foley to monitor UOP -monitor daily renal function -Potassium replete-37mEq PO, potassium phosphate packet -Potassium 34mEq PO solution x1 dose -BMP stat for potassium recheck  -BMP at 1500 for recheck  GASTROINTESTINAL A:    History of achalasia s/p esophageal dilation on 1/14  GI ulcer ppx NG appears coiled, thought to be most likely  secondary to anatomical variance  P:   Famotidine  Continue feeding per tube-Vital 28ml/hr  HEMATOLOGIC A:   Hgb 8.6 on 1/23-unclear etiology for anemia but presumed iron deficiency given home iron Tx DVT ppx SCDs No acute issues, gastrooccult positive CT head neg for hemorrhage  P:  -transfused 2 units of PRBC's on 1/22 for Hgb of 5.7 -continue to monitor for acute blood loss -monitor CBC -SCDs -transfusion threshold HgB of <7   INFECTIOUS A:   Sepsis due to possible aspiration pna  Aspiration PNA. CXR 1/19 showing hazy  opacity in right mid and lower lung zones.  P:   -vanc and zosyn per pharmacy  -cultures as above  ENDOCRINE A:   Hypoglycemia presentation in the ER, with history of diabetes controlled by oral medications at home- glipizide, actos, and tradjenta  Glucose of 39 in the ER.  P:   -on SSI q4 hours  -continue feeding supplement per tube  NEUROLOGIC A:   Sedated and intubated, propofol off  CT head neg for acute intracranial hemorrhage  Encephalopathy due to hypoglycemia (prolonged?- as he is on 3 oral diabetes meds at home), and possible seizures- neurology was consulted. Per neurology,  overall presentation consistent with diffuse cortical injury secondary to prolonged hypoglycemia. Large brainstem stroke is also possible but lower on the differential.  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 Hypomag and Hypok- were repleted yesterday as they can also lower zeisure threshold  P:   -neurology following and appreciate recs , currently doing LTM EEG from spot EEG  -valproic acid per neurology, keppra added  -seizure precautions  -MRI when able  -RASS goal: 0,-1 -Daily weaning trials as indicated -MR Brain ordered  FAMILY  - Updates: Family updated daily at bedside.   - Inter-disciplinary family meet or Palliative Care meeting due by:   1/25  Kathi Ludwig, MD PCCM Resident Pager# [phone]

## 2017-05-01 NOTE — Progress Notes (Signed)
CRITICAL VALUE ALERT  Critical Value:  Potassium 2.5  Date & Time Notied:  05/01/17 0515  Provider Notified: Dr. Shan Levans  Orders Received/Actions taken: MD to place orders

## 2017-05-01 NOTE — Progress Notes (Signed)
RN spoke with Dr. Shan Levans regarding OG tube on chest xray appearing coiled and possibly not in stomach, tube feeds paused. Per MD, restart tube feeds and give PO potassium as ordered.

## 2017-05-01 NOTE — Care Management Note (Signed)
Case Management Note  Patient Details  Name: [name] MRN: [id number] Date of Birth: May 16, 1940  Subjective/Objective:     Pt admitted with profound hypolycemia   - pt was unresponsive requiring ventilation          Action/Plan:   PTA from home with wife.  Pt remains unresponsive   Expected Discharge Date:                  Expected Discharge Plan:     In-House Referral:     Discharge planning Services  CM Consult  Post Acute Care Choice:    Choice offered to:     DME Arranged:    DME Agency:     HH Arranged:    HH Agency:     Status of Service:     If discussed at [employer], dates discussed:    Additional Comments:  Maryclare Labrador, RN 05/01/2017, 3:57 PM

## 2017-05-01 NOTE — Progress Notes (Signed)
Pt transported to MRI via ventilator with no complications. Pt will continue to be monitored in [facility]

## 2017-05-02 ENCOUNTER — Inpatient Hospital Stay (HOSPITAL_COMMUNITY): Payer: Medicare Other

## 2017-05-02 ENCOUNTER — Inpatient Hospital Stay: Payer: Medicare Other | Admitting: Nurse Practitioner

## 2017-05-02 DIAGNOSIS — N183 Chronic kidney disease, stage 3 (moderate): Secondary | ICD-10-CM

## 2017-05-02 DIAGNOSIS — R402431 Glasgow coma scale score 3-8, in the field [EMT or ambulance]: Secondary | ICD-10-CM

## 2017-05-02 LAB — GLUCOSE, CAPILLARY
GLUCOSE-CAPILLARY: 148 mg/dL — AB (ref 65–99)
GLUCOSE-CAPILLARY: 153 mg/dL — AB (ref 65–99)
GLUCOSE-CAPILLARY: 169 mg/dL — AB (ref 65–99)
Glucose-Capillary: 94 mg/dL (ref 65–99)
Glucose-Capillary: 144 mg/dL — ABNORMAL HIGH (ref 65–99)
Glucose-Capillary: 136 mg/dL — ABNORMAL HIGH (ref 65–99)

## 2017-05-02 LAB — CBC
HCT: 26.4 % — ABNORMAL LOW (ref 39.0–52.0)
Hemoglobin: 9.1 g/dL — ABNORMAL LOW (ref 13.0–17.0)
MCH: 30.2 pg (ref 26.0–34.0)
MCHC: 34.5 g/dL (ref 30.0–36.0)
MCV: 87.7 fL (ref 78.0–100.0)
PLATELETS: 137 10*3/uL — AB (ref 150–400)
RBC: 3.01 MIL/uL — AB (ref 4.22–5.81)
RDW: 16.2 % — ABNORMAL HIGH (ref 11.5–15.5)
WBC: 4.9 10*3/uL (ref 4.0–10.5)

## 2017-05-02 LAB — CULTURE, BLOOD (ROUTINE X 2)
CULTURE: NO GROWTH
Culture: NO GROWTH
Special Requests: ADEQUATE
Special Requests: ADEQUATE

## 2017-05-02 LAB — BASIC METABOLIC PANEL
Anion gap: 11 (ref 5–15)
BUN: 39 mg/dL — ABNORMAL HIGH (ref 6–20)
CALCIUM: 7.9 mg/dL — AB (ref 8.9–10.3)
CO2: 20 mmol/L — ABNORMAL LOW (ref 22–32)
CREATININE: 2.01 mg/dL — AB (ref 0.61–1.24)
Chloride: 116 mmol/L — ABNORMAL HIGH (ref 101–111)
GFR, EST AFRICAN AMERICAN: 35 mL/min — AB (ref >60)
GFR, EST NON AFRICAN AMERICAN: 31 mL/min — AB (ref >60)
Glucose, Bld: 114 mg/dL — ABNORMAL HIGH (ref 65–99)
Potassium: 3.4 mmol/L — ABNORMAL LOW (ref 3.5–5.1)
SODIUM: 147 mmol/L — AB (ref 135–145)

## 2017-05-02 LAB — MAGNESIUM: MAGNESIUM: 2 mg/dL (ref 1.7–2.4)

## 2017-05-02 MED ORDER — POTASSIUM CHLORIDE 20 MEQ/15ML (10%) PO SOLN
40.0000 meq | Freq: Three times a day (TID) | ORAL | Status: AC
  Administered 2017-05-02 (×2): 40 meq via ORAL
  Filled 2017-05-02 (×3): qty 30

## 2017-05-02 MED ORDER — FREE WATER
250.0000 mL | Freq: Three times a day (TID) | Status: DC
  Administered 2017-05-02 – 2017-05-03 (×4): 250 mL

## 2017-05-02 NOTE — Progress Notes (Signed)
PULMONARY / CRITICAL CARE MEDICINE   Name: [name] MRN: [id number] DOB: 03/12/1941    ADMISSION DATE:  04/24/2017 CONSULTATION DATE:  1/19  REFERRING MD:  ER  CHIEF COMPLAINT:  AMS  HISTORY OF PRESENT ILLNESS:   This 77 y.o. African American male presented to the ER with altered mental status. He was unresponsive but with pulse and respirations. Due to concerns for inability to protect his airway, he was intubated in the ER. The patient's last known well time was last night when he turned in to sleep. The patient's wife is at the bedside and is able to provide some additional helpful history. She believes that the patient was "alright" last night, but she went to sleep first. She notes that the patient has been waking up later and later in the mornings and was determined to make him wake up to eat breakfast today. She tried to wake him up at around 0900 but to no avail. So she called EMS.  Of note, the patient is known to be diabetic, for which he takes PO diabetic medications. He has not been eating well. He has a history of achalasia and underwent esophageal dilatation around 04/22/2017 but apparently has continued to have modest dietary intake (he is typically limited to a liquid diet due to achalasia). His glucose in the ER was 39. He was given D50 with appropriate increase in his glucose level but his mental status has not improved.  This patient is critically ill and cannot provide additional history due to Unconsciousness and Ventilated.   REVIEW OF SYSTEMS:   unable to obtain due to patient's altered mental status and intubated state.  SUBJECTIVE:   No acute events.  Pt still not following any commands   VITAL SIGNS: BP (!) 157/85 (BP Location: Left Arm)   Pulse 81   Temp 99.1 F (37.3 C) (Bladder)   Resp 13   Wt 148 lb 5.9 oz (67.3 kg)   SpO2 100%   BMI 22.56 kg/m   HEMODYNAMICS:   VENTILATOR SETTINGS: Vent Mode: PRVC FiO2 (%):  [40 %] 40 % Set Rate:  [12  bmp-14 bmp] 12 bmp Vt Set:  [500 mL] 500 mL PEEP:  [5 cmH20] 5 cmH20 Pressure Support:  [10 cmH20] 10 cmH20 Plateau Pressure:  [13 cmH20-16 cmH20] 16 cmH20  INTAKE / OUTPUT: I/O last 3 completed shifts: In: 5245.3 [I.V.:1838.3; Blood:640; NG/GT:1940; IV GNOIBBCWU:889] Out: 1694 [Urine:1055; Stool:550]  PHYSICAL EXAMINATION: General:  Sedated, intubated, NAD Neuro: same as above, does not open eyes to command or hard sternal rubs  HEENT:  mmm, right pupil not active to light   Cardiovascular:  RRR, no mrg Lungs:  Symmetric, coarse rhonchi, and some crackles stable   Abdomen:  Soft , nondistended, normal bowel sounds  Musculoskeletal:  2+ DP pulses, does not spontaneously move extremities. Good muscle tone  Skin:  No obvious rashes, skin intact   LABS:  BMET Recent Labs  Lab 05/01/17 0313 05/01/17 1214 05/02/17 0324  NA 145 145 147*  K 2.5* 2.8* 3.4*  CL 114* 114* 116*  CO2 16* 18* 20*  BUN 36* 39* 39*  CREATININE 2.13* 2.11* 2.01*  GLUCOSE 111* 182* 114*   Electrolytes Recent Labs  Lab 04/29/17 0609  04/29/17 1631  05/01/17 0313 05/01/17 1214 05/02/17 0324  CALCIUM  --    < >  --    < > 7.8* 7.8* 7.9*  MG 1.7  --  1.7  --  1.7  --  2.0  PHOS 4.0  --  3.7  --  2.3*  --   --    < > = values in this interval not displayed.   CBC Recent Labs  Lab 04/30/17 0738 04/30/17 1600 05/01/17 0313 05/02/17 0324  WBC 9.2  --  6.4 4.9  HGB 5.7* 8.4* 8.6* 9.1*  HCT 16.4* 24.4* 24.4* 26.4*  PLT 136*  --  140* 137*   Coag's Recent Labs  Lab 04/17/2017 1015  INR 0.93   Sepsis Markers Recent Labs  Lab 04/28/17 1116 04/28/17 1439 04/28/17 1608  LATICACIDVEN 3.3* 2.5* 2.7*   ABG Recent Labs  Lab 04/23/2017 1015 04/22/2017 1530 04/28/17 0345  PHART 7.525* 7.503* 7.471*  PCO2ART 24.6* 24.9* 27.8*  PO2ART 58.0* 280* 98.8   Liver Enzymes Recent Labs  Lab 05/08/2017 1015 04/29/17 0755 04/30/17 0504  AST 28 34 32  ALT 12* 9* 10*  ALKPHOS 83 124 71  BILITOT 0.7  0.5 0.6  ALBUMIN 2.1* 1.6* 1.4*   Cardiac Enzymes Recent Labs  Lab 04/22/2017 1015  TROPONINI <0.03   Glucose Recent Labs  Lab 05/01/17 0716 05/01/17 1212 05/01/17 1601 05/01/17 2015 05/01/17 2358 05/02/17 0430  GLUCAP 117* 154* 152* 169* 169* 40   Imaging Mr Brain Wo Contrast  Result Date: 05/01/2017 CLINICAL DATA:  Altered mental status. Initially found unresponsive today. EXAM: MRI HEAD WITHOUT CONTRAST TECHNIQUE: Multiplanar, multiecho pulse sequences of the brain and surrounding structures were obtained without intravenous contrast. COMPARISON:  CT HEAD April 27, 2017 FINDINGS: INTRACRANIAL CONTENTS: No reduced diffusion to suggest acute ischemia. A few scattered chronic micro hemorrhages associated with chronic hypertension. Moderate parenchymal brain volume loss without hydrocephalus. Symmetric FLAIR T2 bright signal bilateral putamen and to lesser extent caudate head without T1 prolongation. Patchy supratentorial white matter FLAIR T2 hyperintensities. No midline shift, mass effect or masses. No abnormal extra-axial fluid collections. Normal symmetric size, morphology and signal of the hippocampi VASCULAR: Normal major intracranial vascular flow voids present at skull base. SKULL AND UPPER CERVICAL SPINE: No abnormal sellar expansion. No suspicious calvarial bone marrow signal. Craniocervical junction maintained. SINUSES/ORBITS: Trace paranasal sinus mucosal thickening. Trace RIGHT mastoid effusion. Dystrophic LEFT ocular lens, asymmetrically smaller. OTHER: Life-support lines in place. IMPRESSION: 1. Mild symmetric basal ganglia signal abnormality associated with toxic insult, metabolic disturbance such as hyperammonemia, hypoglycemia. 2. Moderate parenchymal brain volume loss and mild chronic small vessel ischemic disease. Electronically Signed   By: Elon Alas M.D.   On: 05/01/2017 15:52   STUDIES:  CT head 1/19 IMPRESSION: No acute intracranial abnormality. Mild  parenchymal atrophy and moderate chronic microvascular disease. MRI Brain 1/23: Mild symmetric basal ganglia signal abnormality associated with toxic insult, metabolic disturbance such as hyperammonemia, hypoglycemia. 2. Moderate parenchymal brain volume loss and mild chronic small vessel ischemic disease.  CXR 1/19 IMPRESSION: Endotracheal tube tip 3.6 cm above carina. No pneumothorax. There is now hazy opacity throughout much of the right mid and lower lung zones. Question developing pneumonia or atypical edema. There has been interval clearing of presumed atelectasis from the medial left base. Heart size normal.  CULTURES: Blood Culture Urine culture 1/20 >>> Tracheal aspirate culture 1/20 >>> gram stain consistent with respiratory flora  Blood culture 1/19 >>> MRSA PCR >>> positive   ANTIBIOTICS: Zosyn 1/19 >>> d/c'd on 1/23 Vancomycin 1/19 >>> d/c'd on 1/23  SIGNIFICANT EVENTS: 1/19 found comatose at home --> ER --> intubated  LINES/TUBES: ETT 1/19 >>> Foley 1/19 >>>  DISCUSSION: 50 yoM non-insulin dependent diabetic, achalasia,  with recent esophageal dilation on 1/14, CKD3-4,  DVT in 2008, , HTN, HLD, who presents after being unable to be awakened by his wife, and noted to have a glucose of 39 in the ER> currently intubated, and ?seizure/postictal? Hypoglycemia?  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, foley  has minimal urine output and 500 cc over past 24 hours  Hypokalemia-3.4 again noted on 1/24 P:   -foley to monitor UOP -monitor daily renal function -BMP in am  -Replete potassium solution per tube  GASTROINTESTINAL A:    History of achalasia s/p esophageal dilation on 1/14  GI ulcer ppx NG appears coiled, thought to be most likely secondary to anatomical variance  P:   Famotidine  Continue feeding per tube-Vital 85ml/hr  HEMATOLOGIC A:   Hgb stable-unclear etiology for anemia but presumed iron deficiency given home iron Tx DVT ppx w/ SCDs No acute issues, gastrooccult positive CT head neg for hemorrhage  P:  -transfused 2 units of PRBC's on 1/22 for Hgb of 5.7 -continue to monitor for acute blood loss -monitor CBC -SCDs -transfusion threshold HgB of <7   INFECTIOUS A:   Sepsis due to possible aspiration ?pna? Completed five days of treatment-no current signs of infection Aspiration PNA. CXR 1/19 showing hazy opacity in right mid and lower lung zones.  P:   -discontinued vancomycin and zosyn after 5 days of treatment -cultures as above  ENDOCRINE A:   Hypoglycemia presentation in the ER, with history of diabetes controlled by oral medications at home- glipizide, actos, and tradjenta  Glucose of 39 in the ER.  P:   -on SSI q4 hours  -continue feeding supplement per tube  NEUROLOGIC A:   Sedated and intubated, propofol off   MRI brain-Mild symmetric basal ganglia signal abnormality associated w/ toxic insult such as from hyperammonemia or hypoglycemia Encephalopathy due to hypoglycemia (prolonged?- as he is on 3 oral diabetes meds at home), and possible seizures- neurology was consulted.  Per neurology,  overall presentation consistent with diffuse cortical injury secondary to prolonged hypoglycemia. Large brainstem stroke is also possible but lower on the differential.  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 MRI consistent with this finding as above.  Hypomag and Hypok- were repleted yesterday as they can also lower zeisure threshold  P:   -neurology no longer following, see above -valproic acid per neurology, keppra added? Would appriciate their continued assistance with this matter -seizure precautions  -MRI when able  -RASS goal: 0,-1 -Daily weaning trials as indicated  FAMILY  - Updates: Family updated daily at bedside.   - Inter-disciplinary family meet or Palliative Care meeting due by:  1/25  Kathi Ludwig, MD PCCM Resident Pager# [phone]  Attending Note:  77 year old male with extensive PMH who was found hypoglycemic.  On exam, completely unresponsive.  Not following any commands with clear lungs.  I reviewed CXR myself, ETT is in a good position.  MRI with diffuse injury.  Neurology signed off as there is no reasonable chance at recovery here.  Spoke with family.  Informed of results and options.  Will continue full vent support for now.  Made a full DNR.  Family is deciding on trach/peg vs comfort.  The patient is critically ill with multiple organ systems failure and requires high complexity decision making for assessment and support, frequent evaluation and titration of therapies, application of advanced monitoring technologies and extensive interpretation of multiple databases.   Critical Care Time devoted to patient care services described in this note is  35  Minutes. This time reflects time of care of this signee Dr Jennet Maduro. This critical care time does not reflect procedure time, or teaching time or supervisory time of PA/NP/Med student/Med Resident etc but could involve care discussion time.  Rush Farmer, M.D. [facility] Pulmonary/Critical Care Medicine. Pager: [phone]. After hours pager: [phone].

## 2017-05-03 DIAGNOSIS — E162 Hypoglycemia, unspecified: Secondary | ICD-10-CM

## 2017-05-03 DIAGNOSIS — Z515 Encounter for palliative care: Secondary | ICD-10-CM

## 2017-05-03 LAB — BASIC METABOLIC PANEL
ANION GAP: 12 (ref 5–15)
BUN: 39 mg/dL — ABNORMAL HIGH (ref 6–20)
CHLORIDE: 116 mmol/L — AB (ref 101–111)
CO2: 20 mmol/L — AB (ref 22–32)
Calcium: 8 mg/dL — ABNORMAL LOW (ref 8.9–10.3)
Creatinine, Ser: 1.75 mg/dL — ABNORMAL HIGH (ref 0.61–1.24)
GFR calc non Af Amer: 36 mL/min — ABNORMAL LOW (ref >60)
GFR, EST AFRICAN AMERICAN: 42 mL/min — AB (ref >60)
Glucose, Bld: 144 mg/dL — ABNORMAL HIGH (ref 65–99)
POTASSIUM: 5.4 mmol/L — AB (ref 3.5–5.1)
Sodium: 148 mmol/L — ABNORMAL HIGH (ref 135–145)

## 2017-05-03 LAB — GLUCOSE, CAPILLARY
GLUCOSE-CAPILLARY: 119 mg/dL — AB (ref 65–99)
GLUCOSE-CAPILLARY: 147 mg/dL — AB (ref 65–99)
Glucose-Capillary: 135 mg/dL — ABNORMAL HIGH (ref 65–99)
Glucose-Capillary: 142 mg/dL — ABNORMAL HIGH (ref 65–99)
Glucose-Capillary: 151 mg/dL — ABNORMAL HIGH (ref 65–99)

## 2017-05-03 LAB — CBC
HEMATOCRIT: 25.6 % — AB (ref 39.0–52.0)
HEMOGLOBIN: 8.9 g/dL — AB (ref 13.0–17.0)
MCH: 30.6 pg (ref 26.0–34.0)
MCHC: 34.8 g/dL (ref 30.0–36.0)
MCV: 88 fL (ref 78.0–100.0)
Platelets: 135 10*3/uL — ABNORMAL LOW (ref 150–400)
RBC: 2.91 MIL/uL — AB (ref 4.22–5.81)
RDW: 16.3 % — ABNORMAL HIGH (ref 11.5–15.5)
WBC: 5.7 10*3/uL (ref 4.0–10.5)

## 2017-05-03 NOTE — Consult Note (Signed)
   [facility] CM Inpatient Consult   05/03/2017  ADRIANN BALLWEG [birth year] [id number]   Patient screened for potential [facility] services. Patient is in the [place] of the [place] [facility] services under patient's Medicare plan. Chart reviewed for admissions and community needs.  Chart review reveals patient remains on vent.  For questions contact:   Natividad Brood, RN BSN [facility] Liaison  [phone] business mobile phone Toll free office [phone]

## 2017-05-03 NOTE — Plan of Care (Signed)
  Progressing Health Behavior/Discharge Planning: Ability to manage health-related needs will improve 05/03/2017 0622 - Progressing by Pieter Partridge A, RN Skin Integrity: Risk for impaired skin integrity will decrease 05/03/2017 0622 - Progressing by Aleisa Howk A, RN Note Patient with very fragile skin- turned every 2 hours and protective skin care products used. Flexiseal working well to capture frequent loose stools.    Progressing Health Behavior/Discharge Planning: Ability to manage health-related needs will improve 05/03/2017 0622 - Progressing by Pieter Partridge A, RN Skin Integrity: Risk for impaired skin integrity will decrease 05/03/2017 0622 - Progressing by Candy Ziegler A, RN Note Patient with very fragile skin- turned every 2 hours and protective skin care products used. Flexiseal working well to capture frequent loose stools.    Not Progressing Activity: Risk for activity intolerance will decrease 05/03/2017 0622 - Not Progressing by Milford Cage, RN Note Patient unable to move purposefully- passive ROM performed.    Completed/Met Education: Knowledge of General Education information will improve 05/03/2017 0622 - Completed/Met by Milford Cage, RN Note Family members adequately updated and educated about plan of care/ hospital policies.

## 2017-05-03 NOTE — Progress Notes (Signed)
PULMONARY / CRITICAL CARE MEDICINE   Name: [name] MRN: [id number] DOB: 09-23-1940    ADMISSION DATE:  05/06/2017 CONSULTATION DATE:  1/19  REFERRING MD:  ER  CHIEF COMPLAINT:  AMS  HISTORY OF PRESENT ILLNESS:   This 77 y.o. African American male presented to the ER with altered mental status. He was unresponsive but with pulse and respirations. Due to concerns for inability to protect his airway, he was intubated in the ER. The patient's last known well time was last night when he turned in to sleep. The patient's wife is at the bedside and is able to provide some additional helpful history. She believes that the patient was "alright" last night, but she went to sleep first. She notes that the patient has been waking up later and later in the mornings and was determined to make him wake up to eat breakfast today. She tried to wake him up at around 0900 but to no avail. So she called EMS.  Of note, the patient is known to be diabetic, for which he takes PO diabetic medications. He has not been eating well. He has a history of achalasia and underwent esophageal dilatation around 04/22/2017 but apparently has continued to have modest dietary intake (he is typically limited to a liquid diet due to achalasia). His glucose in the ER was 39. He was given D50 with appropriate increase in his glucose level but his mental status has not improved.  This patient is critically ill and cannot provide additional history due to Unconsciousness and Ventilated.   SUBJECTIVE:  No acute events.  Pt still not following any commands   VITAL SIGNS: BP 138/84   Pulse 81   Temp 98.6 F (37 C)   Resp 12   Wt 149 lb 14.6 oz (68 kg)   SpO2 100%   BMI 22.79 kg/m   HEMODYNAMICS:    VENTILATOR SETTINGS: Vent Mode: PRVC FiO2 (%):  [40 %] 40 % Set Rate:  [12 bmp] 12 bmp Vt Set:  [500 mL] 500 mL PEEP:  [5 cmH20] 5 cmH20 Pressure Support:  [5 cmH20] 5 cmH20 Plateau Pressure:  [10 cmH20-17 cmH20]  13 cmH20  INTAKE / OUTPUT: I/O last 3 completed shifts: In: 7035 [I.V.:1930; NG/GT:2160; IV Piggyback:527] Out: 0093 [Urine:885; Stool:850]  PHYSICAL EXAMINATION: No change today General:  Sedated, intubated, NAD Neuro: same as above, does not open eyes to command or sternal rubs  HEENT:  mmm, right pupil reactive to light, left with large deformity  Cardiovascular:  RRR, no mrg Lungs:  Symmetric, coarse rhonchi, and some crackles stable   Abdomen:  Soft , nondistended, normal bowel sounds  Musculoskeletal:  2+ DP pulses, does not spontaneously move extremities. Good muscle tone  Skin:  No obvious rashes, skin intact   LABS:  BMET Recent Labs  Lab 05/01/17 1214 05/02/17 0324 05/03/17 0235  NA 145 147* 148*  K 2.8* 3.4* 5.4*  CL 114* 116* 116*  CO2 18* 20* 20*  BUN 39* 39* 39*  CREATININE 2.11* 2.01* 1.75*  GLUCOSE 182* 114* 144*   Electrolytes Recent Labs  Lab 04/29/17 0609  04/29/17 1631  05/01/17 0313 05/01/17 1214 05/02/17 0324 05/03/17 0235  CALCIUM  --    < >  --    < > 7.8* 7.8* 7.9* 8.0*  MG 1.7  --  1.7  --  1.7  --  2.0  --   PHOS 4.0  --  3.7  --  2.3*  --   --   --    < > =  values in this interval not displayed.   CBC Recent Labs  Lab 05/01/17 0313 05/02/17 0324 05/03/17 0235  WBC 6.4 4.9 5.7  HGB 8.6* 9.1* 8.9*  HCT 24.4* 26.4* 25.6*  PLT 140* 137* 135*   Coag's Recent Labs  Lab 04/11/2017 1015  INR 0.93   Sepsis Markers Recent Labs  Lab 04/28/17 1116 04/28/17 1439 04/28/17 1608  LATICACIDVEN 3.3* 2.5* 2.7*   ABG Recent Labs  Lab 04/21/2017 1015 05/03/2017 1530 04/28/17 0345  PHART 7.525* 7.503* 7.471*  PCO2ART 24.6* 24.9* 27.8*  PO2ART 58.0* 280* 98.8   Liver Enzymes Recent Labs  Lab 04/16/2017 1015 04/29/17 0755 04/30/17 0504  AST 28 34 32  ALT 12* 9* 10*  ALKPHOS 83 124 71  BILITOT 0.7 0.5 0.6  ALBUMIN 2.1* 1.6* 1.4*   Cardiac Enzymes Recent Labs  Lab 05/03/2017 1015  TROPONINI <0.03   Glucose Recent Labs  Lab  05/02/17 0812 05/02/17 1208 05/02/17 1548 05/02/17 2035 05/03/17 0009 05/03/17 0348  GLUCAP 153* 148* 144* 136* 119* 135*   Imaging No results found.  STUDIES:  CT head 1/19 IMPRESSION: No acute intracranial abnormality.  Mild parenchymal atrophy and moderate chronic microvascular disease.  CXR 1/19 IMPRESSION: Endotracheal tube tip 3.6 cm above carina. No pneumothorax. There is now hazy opacity throughout much of the right mid and lower lung zones. Question developing pneumonia or atypical edema. There has been interval clearing of presumed atelectasis from the medial left base. Heart size normal.  CULTURES: Blood Culture Urine culture 1/20 >>> no growth Tracheal aspirate culture 1/20 >>> gram stain consistent with respiratory flora  Blood culture 1/19 >>> no growth MRSA PCR >>> positive   ANTIBIOTICS: Zosyn 1/19 >>>1/23 Vancomycin 1/19 >>>1/23  SIGNIFICANT EVENTS: 1/19 found comatose at home --> ER --> intubated  LINES/TUBES: ETT 1/19 >>> Foley 1/19 >>>  DISCUSSION: 76 yoM non-insulin dependent diabetic, achalasia, with recent esophageal dilation on 1/14, CKD3-4,  DVT in 2008, , HTN, HLD, who presents after being unable to be awakened by his wife, and noted to have a glucose of 39 in the ER> currently intubated, and ?seizure/postictal? Hypoglycemia? No improvement in mental status since admission. Family opted for DNR status, Mother and two sons. Will discuss end of life care with extended family 1/25.  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 1.75 Hypokalemia resolved, hyperkalemic today P:   -foley to monitor UOP -monitor daily renal function -Monitor potassium, consider lasix with fluid bolus if it remains elevated -BMP in am  GASTROINTESTINAL A:    History of achalasia s/p esophageal dilation on 1/14  GI ulcer ppx NG appears coiled, thought to be most likely secondary to anatomical variance  P:   Famotidine Continue feeding per tube-Vital 59ml/hr  HEMATOLOGIC A:   Hgb 8.9 on 1/25-unclear etiology for anemia but presumed iron deficiency given home iron Tx DVT ppx SCDs No acute issues, gastrooccult positive CT head neg for hemorrhage  P:  -transfused 2 units of  PRBC's on 1/22 for Hgb of 5.7 -continue to monitor for acute blood loss -monitor CBC -SCDs -transfusion threshold HgB of <7   INFECTIOUS A:   Sepsis due to possible aspiration pna  Aspiration PNA. CXR 1/19 showing hazy opacity in right mid and lower lung zones.  P:   -cultures as above -antibiotics discontinued  ENDOCRINE A:   Hypoglycemia presentation in the ER, with history of diabetes controlled by oral medications at home- glipizide, actos, and tradjenta  Glucose of 39 in the ER.  P:   -on SSI q4 hours  -continue feeding supplement per tube  NEUROLOGIC A:   intubated, propofol off  CT head neg for acute intracranial hemorrhage  Encephalopathy due to hypoglycemia  Per neurology,  overall presentation consistent with diffuse cortical injury secondary to prolonged hypoglycemia. Large brainstem stroke is also possible but lower on the differential.  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 Hypomag and Hypok- were repleted yesterday as they can also lower zeisure threshold  P:   -neurology  following and appreciate recs , currently doing LTM EEG from spot EEG  -valproic acid per neurology, keppra added  -seizure precautions  -MRI when able  -RASS goal: 2-3 -Daily weaning trials as indicated  FAMILY  - Updates: Family updated daily at bedside. Discussing withdraw of care today.  - Inter-disciplinary family meet or Palliative Care meeting due by:  1/25  Kathi Ludwig, MD PCCM Resident Pager# [phone]  Attending Note:  77 year old male with a severe hypoglycemia episode that resulted in severe brain damage.  He remains completely unresponsive.  I reviewed CXR myself, ETT is in good position with clear lungs.  Spoke with family at length.  After discussion, decision was made to d/c all medications and blood draws and focus more on comfort at this point.  PCCM will keep on the vent until family is ready.  The patient is critically ill with multiple organ systems failure and requires high complexity decision making for assessment and support, frequent evaluation and titration of therapies, application of advanced monitoring technologies and extensive interpretation of multiple databases.   Critical Care Time devoted to patient care services described in this note is  35  Minutes. This time reflects time of care of this signee Dr Jennet Maduro. This critical care time does not reflect procedure time, or teaching time or supervisory time of PA/NP/Med student/Med Resident etc but could involve care discussion time.  Rush Farmer, M.D. [facility] Pulmonary/Critical Care Medicine. Pager: [phone]. After hours pager: [phone].

## 2017-05-04 MED ORDER — FENTANYL BOLUS VIA INFUSION
50.0000 ug | INTRAVENOUS | Status: DC | PRN
  Filled 2017-05-04: qty 200

## 2017-05-04 MED ORDER — FENTANYL 2500MCG IN NS 250ML (10MCG/ML) PREMIX INFUSION: 100.0000 ug/h | INTRAVENOUS | Status: DC

## 2017-05-04 MED ORDER — MORPHINE SULFATE (PF) 4 MG/ML IV SOLN
2.0000 mg | INTRAVENOUS | Status: DC | PRN
  Administered 2017-05-05 – 2017-05-07 (×6): 2 mg via INTRAVENOUS
  Administered 2017-05-07 (×2): 3 mg via INTRAVENOUS
  Administered 2017-05-08: 2 mg via INTRAVENOUS
  Administered 2017-05-08: 4 mg via INTRAVENOUS
  Administered 2017-05-08: 2 mg via INTRAVENOUS
  Administered 2017-05-08 – 2017-05-09 (×4): 4 mg via INTRAVENOUS
  Filled 2017-05-04 (×15): qty 1

## 2017-05-04 MED ORDER — MORPHINE SULFATE (PF) 2 MG/ML IV SOLN
2.0000 mg | INTRAVENOUS | Status: DC | PRN
  Administered 2017-05-04: 2 mg via INTRAVENOUS
  Filled 2017-05-04: qty 2

## 2017-05-04 NOTE — Procedures (Signed)
Extubation Procedure Note  Patient Details:   Name: HAROUT SCHEURICH DOB: Jan 26, 1941 MRN: [id number]   Airway Documentation:     Evaluation  O2 sats: stable throughout Complications: No apparent complications Patient did tolerate procedure well. Bilateral Breath Sounds: Clear   No   Patient extubated to room air per MD order per "Withdrawal of life" order guidelines.  Patient tolerated well.  No complications noted.   Philomena Doheny 05/04/2017, 12:51 PM

## 2017-05-04 NOTE — Progress Notes (Signed)
Report given to Rn receiving patient on 6N. Patient will be transferred to 6N05 via bed. Elink notified of transfer. Patient will be treated as palliative patient over there. Family at bedside aware of transfer.   Milford Cage, RN

## 2017-05-04 NOTE — Progress Notes (Signed)
PULMONARY / CRITICAL CARE MEDICINE   Name: JARMAN LITTON MRN: [id number] DOB: [birth year]    ADMISSION DATE:  04/10/2017 CONSULTATION DATE:  1/19  REFERRING MD:  ER  CHIEF COMPLAINT:  AMS  HISTORY OF PRESENT ILLNESS:   77 y.o. African American male with hx DM, achalasia with recent esophageal dilation 1/14 and poor PO intake over last several weeks presented to the ER 1/19 with altered mental status. He was unresponsive but with pulse and respirations. Due to concerns for inability to protect his airway, he was intubated in the ER. The patient's last known well time was the night prior to admit and was found unresponsive about 0900 1/19.   SUBJECTIVE:  No change overnight.  Now DNR with comfort focus.  Awaiting family, likely withdrawal of care today.   VITAL SIGNS: BP 128/73   Pulse 90   Temp 98.1 F (36.7 C)   Resp 12   Wt 73.4 kg (161 lb 13.1 oz)   SpO2 100%   BMI 24.60 kg/m   HEMODYNAMICS:    VENTILATOR SETTINGS: Vent Mode: PRVC FiO2 (%):  [40 %] 40 % Set Rate:  [12 bmp] 12 bmp Vt Set:  [500 mL] 500 mL PEEP:  [5 cmH20] 5 cmH20 Pressure Support:  [5 cmH20] 5 cmH20 Plateau Pressure:  [7 cmH20-18 cmH20] 11 cmH20  INTAKE / OUTPUT: I/O last 3 completed shifts: In: 2639 [I.V.:850; NG/GT:1578; IV Piggyback:211] Out: 1080 [Urine:905; Stool:175]  PHYSICAL EXAMINATION: No change today General:  Sedated, intubated, NAD Neuro: on no sedation, opens eyes, does not track, does not follow commands, does breath over vent rate  HEENT:  mmm, right pupil reactive to light, left with large deformity  Cardiovascular:  RRR, no mrg Lungs:  resps even non labored on vent, coarse    Abdomen:  Soft , nondistended, normal bowel sounds  Musculoskeletal:  Warm and dry, no sig edema    LABS:  BMET Recent Labs  Lab 05/01/17 1214 05/02/17 0324 05/03/17 0235  NA 145 147* 148*  K 2.8* 3.4* 5.4*  CL 114* 116* 116*  CO2 18* 20* 20*  BUN 39* 39* 39*  CREATININE 2.11* 2.01*  1.75*  GLUCOSE 182* 114* 144*   Electrolytes Recent Labs  Lab 04/29/17 0609  04/29/17 1631  05/01/17 0313 05/01/17 1214 05/02/17 0324 05/03/17 0235  CALCIUM  --    < >  --    < > 7.8* 7.8* 7.9* 8.0*  MG 1.7  --  1.7  --  1.7  --  2.0  --   PHOS 4.0  --  3.7  --  2.3*  --   --   --    < > = values in this interval not displayed.   CBC Recent Labs  Lab 05/01/17 0313 05/02/17 0324 05/03/17 0235  WBC 6.4 4.9 5.7  HGB 8.6* 9.1* 8.9*  HCT 24.4* 26.4* 25.6*  PLT 140* 137* 135*   Coag's Recent Labs  Lab 04/10/2017 1015  INR 0.93   Sepsis Markers Recent Labs  Lab 04/28/17 1116 04/28/17 1439 04/28/17 1608  LATICACIDVEN 3.3* 2.5* 2.7*   ABG Recent Labs  Lab 04/10/2017 1015 05/01/2017 1530 04/28/17 0345  PHART 7.525* 7.503* 7.471*  PCO2ART 24.6* 24.9* 27.8*  PO2ART 58.0* 280* 98.8   Liver Enzymes Recent Labs  Lab 05/08/2017 1015 04/29/17 0755 04/30/17 0504  AST 28 34 32  ALT 12* 9* 10*  ALKPHOS 83 124 71  BILITOT 0.7 0.5 0.6  ALBUMIN 2.1* 1.6* 1.4*  Cardiac Enzymes Recent Labs  Lab 04/28/2017 1015  TROPONINI <0.03   Glucose Recent Labs  Lab 05/02/17 2035 05/03/17 0009 05/03/17 0348 05/03/17 0805 05/03/17 0928 05/03/17 1110  GLUCAP 136* 119* 135* 142* 151* 147*   Imaging No results found.  STUDIES:  CT head 1/19 IMPRESSION: No acute intracranial abnormality.  Mild parenchymal atrophy and moderate chronic microvascular disease.  CXR 1/19 IMPRESSION: Endotracheal tube tip 3.6 cm above carina. No pneumothorax. There is now hazy opacity throughout much of the right mid and lower lung zones. Question developing pneumonia or atypical edema. There has been interval clearing of presumed atelectasis from the medial left base. Heart size normal.  CULTURES: Blood Culture Urine culture 1/20 >>> no growth Tracheal aspirate culture 1/20 >>> gram stain consistent with respiratory flora  Blood culture 1/19 >>> no growth MRSA PCR >>> positive    ANTIBIOTICS: Zosyn 1/19 >>>1/23 Vancomycin 1/19 >>>1/23  SIGNIFICANT EVENTS: 1/19 found comatose at home --> ER --> intubated  LINES/TUBES: ETT 1/19 >>> Foley 1/19 >>>  DISCUSSION: 76 yoM non-insulin dependent diabetic, achalasia, with recent esophageal dilation on 1/14, CKD3-4,  DVT in 2008, , HTN, HLD, who presents after being unable to be awakened by his wife, and noted to have a glucose of 39 in the ER> currently intubated, and ?seizure/postictal? Hypoglycemia? No improvement in mental status since admission. Family opted for DNR status, Mother and two sons.   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 Encephalopathy r/t prolonged hypoglycemia. CT heg neg. Probable intermittent seizures per neuro.     PLAN -  Await family, plan for withdrawal of care today  Will add PRN morphine  No further labs, xrays     FAMILY  - Updates: no family at bedside yet this am.  Will discuss when they arrive.     Nickolas Madrid, NP 05/04/2017  8:36 AM Pager: [phone] or [phone]

## 2017-05-04 NOTE — Plan of Care (Signed)
  [facility] Behavior/Discharge Planning: Ability to manage health-related needs will improve 05/04/2017 0636 - Completed/Met by Pieter Partridge A, RN Clinical Measurements: Ability to maintain clinical measurements within normal limits will improve 05/04/2017 0636 - Completed/Met by Amarionna Arca A, RN Will remain free from infection 05/04/2017 0636 - Completed/Met by Milford Cage, RN Diagnostic test results will improve 05/04/2017 0636 - Completed/Met by Milford Cage, RN Respiratory complications will improve 05/04/2017 0636 - Completed/Met by Milford Cage, RN Cardiovascular complication will be avoided 05/04/2017 0636 - Completed/Met by Emine Lopata A, RN Activity: Risk for activity intolerance will decrease 05/04/2017 0636 - Completed/Met by Chiquitta Matty A, RN Coping: Level of anxiety will decrease 05/04/2017 0636 - Completed/Met by Hussam Muniz A, RN Elimination: Will not experience complications related to bowel motility 05/04/2017 0636 - Completed/Met by Rochel Privett A, RN Will not experience complications related to urinary retention 05/04/2017 0636 - Completed/Met by Milford Cage, RN Pain Managment: General experience of comfort will improve 05/04/2017 0636 - Completed/Met by Yazleemar Strassner A, RN Skin Integrity: Risk for impaired skin integrity will decrease   Patient scheduled to have care withdrawn during day shift. Patient has transitioned to comfort care.  05/04/2017 0636 - Completed/Met by Milford Cage, RN

## 2017-05-05 MED ORDER — ATROPINE SULFATE 1 % OP SOLN
2.0000 [drp] | Freq: Four times a day (QID) | OPHTHALMIC | Status: DC | PRN
  Administered 2017-05-05 – 2017-05-10 (×10): 2 [drp] via SUBLINGUAL
  Filled 2017-05-05: qty 2

## 2017-05-05 NOTE — Progress Notes (Signed)
eLink Physician-Brief Progress Note Patient Name: [name] DOB: 1941-02-03 MRN: [id number]   Date of Service  05/05/2017  HPI/Events of Note  Copious oral secretions.   eICU Interventions  Will order: 1. Atropine drops 2 drops SL Q 6 hours PRN oral secretions.      Intervention Category Major Interventions: Other:  Lysle Dingwall 05/05/2017, 10:57 PM

## 2017-05-05 NOTE — Progress Notes (Signed)
PULMONARY / CRITICAL CARE MEDICINE   Name: [name] MRN: [id number] DOB: Apr 11, 1940    ADMISSION DATE:  04/15/2017 CONSULTATION DATE:  1/19  REFERRING MD:  ER  CHIEF COMPLAINT:  AMS  HISTORY OF PRESENT ILLNESS:   77 y.o. African American male with hx DM, achalasia with recent esophageal dilation 1/14 and poor PO intake over last several weeks presented to the ER 1/19 with altered mental status. He was unresponsive but with pulse and respirations. Due to concerns for inability to protect his airway, he was intubated in the ER. The patient's last known well time was the night prior to admit and was found unresponsive about 0900 1/19.   SUBJECTIVE:  Extubated for comfort yesterday.  Now on med surg floor.  Appears very comfortable  Family at bedside   VITAL SIGNS: BP (!) 143/80 (BP Location: Left Arm)   Pulse 99   Temp 98.4 F (36.9 C) (Axillary)   Resp (!) 0   Wt 73.4 kg (161 lb 13.1 oz)   SpO2 (!) 89%   BMI 24.60 kg/m    INTAKE / OUTPUT: I/O last 3 completed shifts: In: -  Out: 480 [Urine:405; Stool:75]  PHYSICAL EXAMINATION:   General:  Comfortable in bed  Neuro: minimal response, moves head spontaneously at times, does not follow commands or open eyes   Cardiovascular:  RRR, no mrg Lungs:  resps even non labored, shallow, comfortable  Abdomen:  Soft , nondistended, hypoactive bowel sounds  Musculoskeletal:  Warm and dry, no sig edema    LABS:  BMET Recent Labs  Lab 05/01/17 1214 05/02/17 0324 05/03/17 0235  NA 145 147* 148*  K 2.8* 3.4* 5.4*  CL 114* 116* 116*  CO2 18* 20* 20*  BUN 39* 39* 39*  CREATININE 2.11* 2.01* 1.75*  GLUCOSE 182* 114* 144*   Electrolytes Recent Labs  Lab 04/29/17 0609  04/29/17 1631  05/01/17 0313 05/01/17 1214 05/02/17 0324 05/03/17 0235  CALCIUM  --    < >  --    < > 7.8* 7.8* 7.9* 8.0*  MG 1.7  --  1.7  --  1.7  --  2.0  --   PHOS 4.0  --  3.7  --  2.3*  --   --   --    < > = values in this interval not  displayed.   CBC Recent Labs  Lab 05/01/17 0313 05/02/17 0324 05/03/17 0235  WBC 6.4 4.9 5.7  HGB 8.6* 9.1* 8.9*  HCT 24.4* 26.4* 25.6*  PLT 140* 137* 135*   Coag's No results for input(s): APTT, INR in the last 168 hours. Sepsis Markers Recent Labs  Lab 04/28/17 1116 04/28/17 1439 04/28/17 1608  LATICACIDVEN 3.3* 2.5* 2.7*   ABG No results for input(s): PHART, PCO2ART, PO2ART in the last 168 hours. Liver Enzymes Recent Labs  Lab 04/29/17 0755 04/30/17 0504  AST 34 32  ALT 9* 10*  ALKPHOS 124 71  BILITOT 0.5 0.6  ALBUMIN 1.6* 1.4*   Cardiac Enzymes No results for input(s): TROPONINI, PROBNP in the last 168 hours. Glucose Recent Labs  Lab 05/02/17 2035 05/03/17 0009 05/03/17 0348 05/03/17 0805 05/03/17 0928 05/03/17 1110  GLUCAP 136* 119* 135* 142* 151* 147*   Imaging No results found.  STUDIES:  CT head 1/19 IMPRESSION: No acute intracranial abnormality.  Mild parenchymal atrophy and moderate chronic microvascular disease.  CXR 1/19 IMPRESSION: Endotracheal tube tip 3.6 cm above carina. No pneumothorax. There is now hazy opacity throughout much of  the right mid and lower lung zones. Question developing pneumonia or atypical edema. There has been interval clearing of presumed atelectasis from the medial left base. Heart size normal.  CULTURES: Blood Culture Urine culture 1/20 >>> no growth Tracheal aspirate culture 1/20 >>> gram stain consistent with respiratory flora  Blood culture 1/19 >>> no growth MRSA PCR >>> positive   ANTIBIOTICS: Zosyn 1/19 >>>1/23 Vancomycin 1/19 >>>1/23  SIGNIFICANT EVENTS: 1/19 found comatose at home --> ER --> intubated  LINES/TUBES: ETT 1/19 >>> Foley 1/19 >>>  DISCUSSION: 76 yoM non-insulin dependent diabetic, achalasia, with recent esophageal dilation on 1/14, CKD3-4,  DVT in 2008, , HTN, HLD, who presents after being unable to be awakened by his wife, and noted to have a glucose of 39 in the ER>  currently intubated, and ?seizure/postictal? Hypoglycemia? No improvement in mental status since admission. Family opted for DNR status, Mother and two sons.   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 Encephalopathy r/t prolonged hypoglycemia. CT heg neg. Probable intermittent seizures per neuro.     PLAN -  Now Comfort care  No further labs, xrays  Continue PRN morphine     FAMILY  - Updates: family updated at bedside 1/27.     Nickolas Madrid, NP 05/05/2017  9:58 AM Pager: [phone] or [phone]

## 2017-05-06 DIAGNOSIS — G9341 Metabolic encephalopathy: Secondary | ICD-10-CM

## 2017-05-06 DIAGNOSIS — Z515 Encounter for palliative care: Secondary | ICD-10-CM

## 2017-05-06 DIAGNOSIS — J9601 Acute respiratory failure with hypoxia: Secondary | ICD-10-CM

## 2017-05-06 NOTE — Progress Notes (Signed)
Nutrition Brief Note  Chart reviewed. Pt now transitioning to comfort care.  No further nutrition interventions warranted at this time.  Please re-consult as needed.   Ryli Standlee A. Alferd Obryant, RD, LDN, CDE Pager: [phone] After hours Pager: [phone]  

## 2017-05-06 NOTE — Progress Notes (Signed)
PULMONARY / CRITICAL CARE MEDICINE   Name: [name] MRN: [id number] DOB: 1940-10-18    ADMISSION DATE:  04/14/2017 CONSULTATION DATE:  1/19  REFERRING MD:  ER  CHIEF COMPLAINT:  AMS  HISTORY OF PRESENT ILLNESS:   77 y.o. African American male with hx DM, achalasia with recent esophageal dilation 1/14 and poor PO intake over last several weeks presented to the ER 1/19 with altered mental status. He was unresponsive but with pulse and respirations. Due to concerns for inability to protect his airway, he was intubated in the ER. The patient's last known well time was the night prior to admit and was found unresponsive about 0900 1/19.    SUBJECTIVE:  No sig change.  Breathing somewhat more shallow, slower.  Comfortable.    VITAL SIGNS: BP (!) 107/56 (BP Location: Right Wrist)   Pulse 92   Temp 99.1 F (37.3 C) (Axillary)   Resp (!) 0   Ht 5\' 8"  (1.727 m)   Wt 73.4 kg (161 lb 13.1 oz)   SpO2 (!) 74%   BMI 24.60 kg/m    INTAKE / OUTPUT: I/O last 3 completed shifts: In: -  Out: 400 [Urine:400]  PHYSICAL EXAMINATION:   General:  Comfortable in bed  Neuro: unresponsive, does not follow commands or open eyes   Cardiovascular:  RRR, no mrg Lungs:  resps even non labored, shallow, comfortable  Abdomen:  Soft , nondistended, hypoactive bowel sounds  Musculoskeletal:  Warm and dry, no sig edema    LABS:  BMET Recent Labs  Lab 05/01/17 1214 05/02/17 0324 05/03/17 0235  NA 145 147* 148*  K 2.8* 3.4* 5.4*  CL 114* 116* 116*  CO2 18* 20* 20*  BUN 39* 39* 39*  CREATININE 2.11* 2.01* 1.75*  GLUCOSE 182* 114* 144*   Electrolytes Recent Labs  Lab 04/29/17 1631  05/01/17 0313 05/01/17 1214 05/02/17 0324 05/03/17 0235  CALCIUM  --    < > 7.8* 7.8* 7.9* 8.0*  MG 1.7  --  1.7  --  2.0  --   PHOS 3.7  --  2.3*  --   --   --    < > = values in this interval not displayed.   CBC Recent Labs  Lab 05/01/17 0313 05/02/17 0324 05/03/17 0235  WBC 6.4 4.9 5.7   HGB 8.6* 9.1* 8.9*  HCT 24.4* 26.4* 25.6*  PLT 140* 137* 135*   Coag's No results for input(s): APTT, INR in the last 168 hours. Sepsis Markers No results for input(s): LATICACIDVEN, PROCALCITON, O2SATVEN in the last 168 hours. ABG No results for input(s): PHART, PCO2ART, PO2ART in the last 168 hours. Liver Enzymes Recent Labs  Lab 04/30/17 0504  AST 32  ALT 10*  ALKPHOS 71  BILITOT 0.6  ALBUMIN 1.4*   Cardiac Enzymes No results for input(s): TROPONINI, PROBNP in the last 168 hours. Glucose Recent Labs  Lab 05/02/17 2035 05/03/17 0009 05/03/17 0348 05/03/17 0805 05/03/17 0928 05/03/17 1110  GLUCAP 136* 119* 135* 142* 151* 147*   Imaging No results found.  STUDIES:  CT head 1/19 IMPRESSION: No acute intracranial abnormality.  Mild parenchymal atrophy and moderate chronic microvascular disease.  CXR 1/19 IMPRESSION: Endotracheal tube tip 3.6 cm above carina. No pneumothorax. There is now hazy opacity throughout much of the right mid and lower lung zones. Question developing pneumonia or atypical edema. There has been interval clearing of presumed atelectasis from the medial left base. Heart size normal.  CULTURES: Blood Culture Urine culture  1/20 >>> no growth Tracheal aspirate culture 1/20 >>> gram stain consistent with respiratory flora  Blood culture 1/19 >>> no growth MRSA PCR >>> positive   ANTIBIOTICS: Zosyn 1/19 >>>1/23 Vancomycin 1/19 >>>1/23  SIGNIFICANT EVENTS: 1/19 found comatose at home --> ER --> intubated  LINES/TUBES: ETT 1/19 >>> Foley 1/19 >>>  DISCUSSION: 76 yoM non-insulin dependent diabetic, achalasia, with recent esophageal dilation on 1/14, CKD3-4,  DVT in 2008, , HTN, HLD, who presents after being unable to be awakened by his wife, and noted to have a glucose of 39 in the ER> currently intubated, and ?seizure/postictal? Hypoglycemia? No improvement in mental status since admission. Family opted for DNR status, Mother and  two sons.   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 Encephalopathy r/t prolonged hypoglycemia. CT heg neg. Probable intermittent seizures per neuro.     PLAN -  continue Comfort care  No further labs, xrays  PRN morphine     FAMILY  - Updates: family updated at bedside 1/28.     Nickolas Madrid, NP 05/06/2017  11:50 AM Pager: [phone] or [phone]

## 2017-05-06 NOTE — Progress Notes (Signed)
Present at the Pt.'s bedside are wife and son. Chaplain met the both of them and was welcomed to spend time. Chaplain offered the Ministry of Presence, Comfort and offered prayer and scripture.  Family felt comforted by the prayer and time spent. They appear to be doing well with Acceptance of [name] critical state. Their is some anticipatory grief. The acceptance is coupled with an undeniable sadness. The patient is indeed loved.

## 2017-05-06 NOTE — Care Management Note (Signed)
Case Management Note  Patient Details  Name: CAMRY THEISS MRN: [id number] Date of Birth: Sep 04, 1940  Subjective/Objective:    77 yr old gentleman admitted in hypoglycemic state, unresponsive. Patient was extubated on 1/2/619 and transferred to 6N05.                 Action/Plan: Case manager spoke with Mrs. Krizan concerning husband's condition and decisions she must make. Mrs. Hengst has excepted that her husband is critically ill and will not return home. She is open to [facility] if doesn't transition before. CM will continue to follow.    Expected Discharge Date:    pending              Expected Discharge Plan:    pending In-House Referral:     Discharge planning Services  CM Consult  Post Acute Care Choice:    Choice offered to:     DME Arranged:   NA DME Agency:     HH Arranged:   NA HH Agency:     Status of Service:     If discussed at [place] of Stay Meetings, dates discussed:    Additional Comments:  Ninfa Meeker, RN 05/06/2017, 3:16 PM

## 2017-05-07 NOTE — Progress Notes (Signed)
[facility] Liaison: RN visit  Received request from Westley Hummer, [place] for family interest in [facility]. Chart reviewed and spoke with wife, Otho Perl and son, Dennard to acknowledge referral. Unfortunately [facility] is not able to offer a room today. Family and CSW are aware [facility] liaison will follow up with CSW and family tomorrow or sooner if room becomes available. Please do not hesitate to call with questions.   Thank you.  Farrel Gordon, RN, [facility] Liaison [phone]  [facility] hospital liaisons are on [place].

## 2017-05-07 NOTE — Progress Notes (Signed)
PULMONARY / CRITICAL CARE MEDICINE   Name: [name] MRN: [id number] DOB: [birth year]    ADMISSION DATE:  04/26/2017 CONSULTATION DATE:  1/19  REFERRING MD:  ER  CHIEF COMPLAINT:  AMS  brief 77 y.o. African American male with hx DM, achalasia with recent esophageal dilation 1/14 and poor PO intake over last several weeks presented to the ER 1/19 with altered mental status. He was unresponsive but with pulse and respirations. Due to concerns for inability to protect his airway, he was intubated in the ER. The patient's last known well time was the night prior to admit and was found unresponsive about 0900 1/19.     STUDIES:  CT head 1/19 IMPRESSION: No acute intracranial abnormality.  Mild parenchymal atrophy and moderate chronic microvascular disease.  CXR 1/19 IMPRESSION: Endotracheal tube tip 3.6 cm above carina. No pneumothorax. There is now hazy opacity throughout much of the right mid and lower lung zones. Question developing pneumonia or atypical edema. There has been interval clearing of presumed atelectasis from the medial left base. Heart size normal.  CULTURES: Blood Culture Urine culture 1/20 >>> no growth Tracheal aspirate culture 1/20 >>> gram stain consistent with respiratory flora  Blood culture 1/19 >>> no growth MRSA PCR >>> positive   ANTIBIOTICS: Zosyn 1/19 >>>1/23 Vancomycin 1/19 >>>1/23  SIGNIFICANT EVENTS: 1/19 found comatose at home --> ER --> intubated ETT 1/19 >>> extubated ? date Foley 1/19 >>> 1/28  - No sig change.  Breathing somewhat more shallow, slower.  Comfortable.     SUBJECTIVE/OVERNIGHT/INTERVAL HX 1/29 - son at  Bedside. Saying patient still hanging in and wondering about life expectancy. Patient making adequate urine per son. Denies death rattle. Denies pain. Denies gurgling  VITAL SIGNS: BP 140/72 (BP Location: Left Arm)   Pulse 100   Temp 99.5 F (37.5 C) (Axillary)   Resp (!) 26   Ht 5\' 8"  (1.727 m)   Wt  73.4 kg (161 lb 13.1 oz)   SpO2 (!) 88%   BMI 24.60 kg/m    INTAKE / OUTPUT: I/O last 3 completed shifts: In: -  Out: 1000 [Urine:1000]  PHYSICAL EXAMINATION:    General Appearance:    Looks terminal, calm, relaxed  Head:    Normocephalic, without obvious abnormality, atraumatic  Eyes:    PERRL - yes, conjunctiva/corneas - clear      Ears:    Normal external ear canals, both ears  Nose:   NG tube - no  Throat:  ETT TUBE - no , OG tube - no  Neck:   Supple,  No enlargement/tenderness/nodules     Lungs:     More junky sounding lungs. No distress  Chest wall:    No deformity  Heart:    S1 and S2 normal, no murmur, CVP - no.  Pressors - no  Abdomen:     Soft, no masses, no organomegaly  Genitalia:    Not done  Rectal:   not done  Extremities:   Extremities- intact     Skin:   Intact in exposed areas .      Neurologic:   Sedation - none -> RASS - -3/-4      LABS: PULMONARY No results for input(s): PHART, PCO2ART, PO2ART, HCO3, TCO2, O2SAT in the last 168 hours.  Invalid input(s): PCO2, PO2  CBC Recent Labs  Lab 05/01/17 0313 05/02/17 0324 05/03/17 0235  HGB 8.6* 9.1* 8.9*  HCT 24.4* 26.4* 25.6*  WBC 6.4 4.9 5.7  PLT 140* 137*  135*    COAGULATION No results for input(s): INR in the last 168 hours.  CARDIAC  No results for input(s): TROPONINI in the last 168 hours. No results for input(s): PROBNP in the last 168 hours.   CHEMISTRY Recent Labs  Lab 05/01/17 0313 05/01/17 1214 05/02/17 0324 05/03/17 0235  NA 145 145 147* 148*  K 2.5* 2.8* 3.4* 5.4*  CL 114* 114* 116* 116*  CO2 16* 18* 20* 20*  GLUCOSE 111* 182* 114* 144*  BUN 36* 39* 39* 39*  CREATININE 2.13* 2.11* 2.01* 1.75*  CALCIUM 7.8* 7.8* 7.9* 8.0*  MG 1.7  --  2.0  --   PHOS 2.3*  --   --   --    Estimated Creatinine Clearance: 34.7 mL/min (A) (by C-G formula based on SCr of 1.75 mg/dL (H)).   LIVER No results for input(s): AST, ALT, ALKPHOS, BILITOT, PROT, ALBUMIN, INR in the last  168 hours.   INFECTIOUS No results for input(s): LATICACIDVEN, PROCALCITON in the last 168 hours.   ENDOCRINE CBG (last 3)  No results for input(s): GLUCAP in the last 72 hours.       IMAGING x48h  - image(s) personally visualized  -   highlighted in bold No results found.     DISCUSSION: 28 yoM non-insulin dependent diabetic, achalasia, with recent esophageal dilation on 1/14, CKD3-4,  DVT in 2008, , HTN, HLD, who presents after being unable to be awakened by his wife, and noted to have a glucose of 39 in the ER> currently intubated, and ?seizure/postictal? Hypoglycemia? No improvement in mental status since admission. Family opted for DNR status, Mother and two sons.   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 CT heg neg. Probable intermittent seizures per neuro.  - Life expectancy few to several days due to continued urine output and no death rattle   PLAN -  continue Comfort care  No further labs, xrays  PRN morphine  Give to triad for 05/08/17 and ccm off; D/w Dr Thereasa Solo    FAMILY  - Updates: son updated bedside 05/07/2017    Dr. Brand Males, M.D., [facility].C.P Pulmonary and Critical Care Medicine Staff Physician, [place] Director - Interstitial Lung Disease  Program  P[facility] [place] [facility] [place], [place], [address]  Pager: [phone], If no answer or between  15:00h - 7:00h: call [phone] Telephone: [phone]

## 2017-05-07 NOTE — Progress Notes (Signed)
Palliative Medicine RN Note: Dr Domingo Cocking w PMT has spoke with Dr Chase Caller. We will hold off on a formal consult until patient is transitioned to [facility], likely tomorrow. Please call us if needed before then.  Marjie Skiff Kirsten Mckone, RN, BSN, [facility] Palliative Medicine Team 05/07/2017 11:37 AM Office [phone]

## 2017-05-08 NOTE — Social Work (Signed)
CSW followed up with [facility] liaison Bevely Palmer. [facility] not able to offer placement for pt yesterday, liaison will let CSW and pt family know if there is an open bed today.   CSW continuing to follow.   Alexander Mt, [place] Work [phone]

## 2017-05-08 NOTE — Progress Notes (Signed)
PROGRESS NOTE    [name]  TIR:[id number] DOB: 09-Dec-1940 DOA: 04/23/2017 PCP: Lance Sell, NP  Outpatient Specialists:   Brief Narrative: Patient is a 77 year old African-American male with past medical history significant for prostate cancer, pneumonia, hypertension, hyperlipidemia, GERD, diabetes mellitus type 2, chronic kidney disease, cataract, anemia and DVTs.  Patient was admitted with unconsciousness/coma thought to be likely secondary to hypoglycemic coma.  The patient was initially admitted under the ICU team.  There was also concern for possible pneumonia.  Due to non-improvement, the patient's family have elected for patient to be discharged to a hospice place for comfort measures.    Assessment & Plan:   Active Problems:   Type 2 diabetes mellitus with renal manifestations (HCC)   Normocytic anemia   Hypoglycemia   CKD (chronic kidney disease) stage 3, GFR 30-59 ml/min (HCC)   Acute metabolic encephalopathy   Acute hypoxemic respiratory failure (HCC)   Leukopenia   Coma ([place])   Acute metabolic encephalopathy secondary to severe hypoglycemia. Patient remains encephalopathic. Patient is now comfort measures only. We will discharge patient to hospice facility once bed is available.  Diabetes mellitus. Patient developed severe hypoglycemia and went into coma. Currently see above.  Normocytic anemia, stable.  History of prostatic cancer.  Hypertension.   DVT prophylaxis:None. Code Status: DO NOT RESUSCITATE Family Communication: Wife and 2 sons. Disposition Plan: Discharge patient to a hospice home.   Consultants:   Neurology.   Gastroenterology.   Procedures:   Antimicrobials:      Subjective: The patient cannot give any history due to anoxic brain injury.  Objective: Vitals:   05/06/17 0627 05/06/17 0906 05/07/17 0544 05/08/17 0509  BP: (!) 107/56  140/72 139/78  Pulse: 92  100 (!) 103  Resp:   (!) 26   Temp: 99.1 F (37.3  C)  99.5 F (37.5 C) 99.5 F (37.5 C)  TempSrc: Axillary  Axillary Axillary  SpO2: (!) 74%  (!) 88% (!) 75%  Weight:      Height:  5\' 8"  (1.727 m)      Intake/Output Summary (Last 24 hours) at 05/08/2017 1835 Last data filed at 05/08/2017 0517 Gross per 24 hour  Intake -  Output 500 ml  Net -500 ml   Filed Weights   05/02/17 0417 05/03/17 0408 05/04/17 0453  Weight: 67.3 kg (148 lb 5.9 oz) 68 kg (149 lb 14.6 oz) 73.4 kg (161 lb 13.1 oz)    Examination:  General exam: Not in distress. Respiratory system: Clear to auscultation. Respiratory effort normal. Cardiovascular system: S1 & S2. No pedal edema. Gastrointestinal system: Abdomen is nondistended, soft and nontender.  Central nervous system: Patient is anoxic.Alert and oriented. No focal neurological deficits..Extremities: Symmetric 5 x 5 power.   Data Reviewed: I have personally reviewed following labs and imaging studies  CBC: Recent Labs  Lab 05/02/17 0324 05/03/17 0235  WBC 4.9 5.7  HGB 9.1* 8.9*  HCT 26.4* 25.6*  MCV 87.7 88.0  PLT 137* 676*   Basic Metabolic Panel: Recent Labs  Lab 05/02/17 0324 05/03/17 0235  NA 147* 148*  K 3.4* 5.4*  CL 116* 116*  CO2 20* 20*  GLUCOSE 114* 144*  BUN 39* 39*  CREATININE 2.01* 1.75*  CALCIUM 7.9* 8.0*  MG 2.0  --    GFR: Estimated Creatinine Clearance: 34.7 mL/min (A) (by C-G formula based on SCr of 1.75 mg/dL (H)). Liver Function Tests: No results for input(s): AST, ALT, ALKPHOS, BILITOT, PROT, ALBUMIN in the last  168 hours. No results for input(s): LIPASE, AMYLASE in the last 168 hours. No results for input(s): AMMONIA in the last 168 hours. Coagulation Profile: No results for input(s): INR, PROTIME in the last 168 hours. Cardiac Enzymes: No results for input(s): CKTOTAL, CKMB, CKMBINDEX, TROPONINI in the last 168 hours. BNP (last 3 results) No results for input(s): PROBNP in the last 8760 hours. HbA1C: No results for input(s): HGBA1C in the last 72  hours. CBG: Recent Labs  Lab 05/03/17 0009 05/03/17 0348 05/03/17 0805 05/03/17 0928 05/03/17 1110  GLUCAP 119* 135* 142* 151* 147*   Lipid Profile: No results for input(s): CHOL, HDL, LDLCALC, TRIG, CHOLHDL, LDLDIRECT in the last 72 hours. Thyroid Function Tests: No results for input(s): TSH, T4TOTAL, FREET4, T3FREE, THYROIDAB in the last 72 hours. Anemia Panel: No results for input(s): VITAMINB12, FOLATE, FERRITIN, TIBC, IRON, RETICCTPCT in the last 72 hours. Urine analysis:    Component Value Date/Time   COLORURINE AMBER (A) 04/10/2017 2057   APPEARANCEUR CLOUDY (A) 04/23/2017 2057   LABSPEC 1.021 04/15/2017 2057   PHURINE 5.0 05/09/2017 2057   GLUCOSEU NEGATIVE 04/14/2017 2057   GLUCOSEU NEGATIVE 09/22/2014 0757   HGBUR MODERATE (A) 05/03/2017 2057   BILIRUBINUR NEGATIVE 04/26/2017 2057   BILIRUBINUR n 04/30/2011 1534   KETONESUR NEGATIVE 04/20/2017 2057   PROTEINUR 30 (A) 04/20/2017 2057   UROBILINOGEN 0.2 09/22/2014 0757   NITRITE NEGATIVE 04/14/2017 2057   LEUKOCYTESUR NEGATIVE 04/23/2017 2057   Sepsis Labs: @LABRCNTIP (procalcitonin:4,lacticidven:4)  )No results found for this or any previous visit (from the past 240 hour(s)).       Radiology Studies: No results found.      Scheduled Meds: Continuous Infusions:   LOS: 11 days    Time spent: 25 minutes.    Dana Allan, MD  [facility] Pager #: [phone] 7PM-7AM contact night coverage as above

## 2017-05-08 NOTE — Progress Notes (Signed)
[facility] Liaison: RN note  Received request from Westley Hummer, [place] for family interest in [facility]. Chart reviewed.  Unfortunately [facility] is not able to offer a room today. Family and CSW are aware [facility] liaison will follow up with CSW and family tomorrow or sooner if room becomes available. Please do not hesitate to call with questions.   Thank you.  Farrel Gordon, RN, [facility] Liaison [phone]  [facility] hospital liaisons are on [place].

## 2017-05-09 MED ORDER — SODIUM CHLORIDE 0.9 % IV SOLN
INTRAVENOUS | Status: DC
  Administered 2017-05-09: 1 mL via INTRAVENOUS

## 2017-05-09 MED ORDER — SODIUM CHLORIDE 0.9 % IV SOLN
3.0000 mg/h | INTRAVENOUS | Status: DC
  Administered 2017-05-09: 2 mg/h via INTRAVENOUS
  Filled 2017-05-09: qty 10

## 2017-05-09 MED ORDER — MORPHINE BOLUS VIA INFUSION
4.0000 mg | INTRAVENOUS | Status: DC | PRN
  Administered 2017-05-09 – 2017-05-10 (×5): 4 mg via INTRAVENOUS
  Filled 2017-05-09: qty 4

## 2017-05-09 NOTE — Progress Notes (Signed)
Chaplain Marjory Lies was accompanied by Dewayne Shorter and Chaplain Lovelle. Patient is supported by a loving and warmly connected family system. Chaplain offered greetings, words of comfort, scripture and Dewayne Shorter prayed. Family expressed gratitude for the visit. Family appears to be well accepting of patient's critical condition. Am a little concerned about patient's spouse. The death event may be very hard for her. The love and support of gathered family is felt in the room.

## 2017-05-09 NOTE — Progress Notes (Signed)
Pt started Morphine drip.

## 2017-05-09 NOTE — Progress Notes (Signed)
PROGRESS NOTE    [name]  NUU:[id number] DOB: 1940-09-21 DOA: 04/24/2017 PCP: Lance Sell, NP  Outpatient Specialists:   Brief Narrative: Patient is a 77 year old African-American male with past medical history significant for prostate cancer, pneumonia, hypertension, hyperlipidemia, GERD, diabetes mellitus type 2, chronic kidney disease, cataract, anemia and DVTs.  Patient was admitted with unconsciousness/coma thought to be likely secondary to hypoglycemic coma.  The patient was initially admitted under the ICU team.  There was also concern for possible pneumonia.  Due to non-improvement, the patient's family have elected for patient to be discharged to a hospice place for comfort measures.    Assessment & Plan:   Active Problems:   Type 2 diabetes mellitus with renal manifestations (HCC)   Normocytic anemia   Hypoglycemia   CKD (chronic kidney disease) stage 3, GFR 30-59 ml/min (HCC)   Acute metabolic encephalopathy   Acute hypoxemic respiratory failure (HCC)   Leukopenia   Coma ([place])   Acute metabolic encephalopathy secondary to severe hypoglycemia. Patient is actively dying. Palliative care team has started the patient on morphine drip, to titrate to comfort. Patient remains encephalopathic. Patient is comfort measures only. We will discharge patient to hospice facility if feasible (if the patient does not passed on, and the patient is stable to be transferred).    Diabetes mellitus. Patient developed severe hypoglycemia and went into coma. Comfort measures only.  Normocytic anemia -comfort care only.  History of prostatic cancer.  Hypertension.   DVT prophylaxis:None. Code Status: DO NOT RESUSCITATE Family Communication: Wife and 2 sons. Disposition Plan: Discharge patient to a hospice home.   Consultants:   Neurology.   Gastroenterology.   Procedures:   Antimicrobials:      Subjective: The patient cannot give any history due to  anoxic brain injury.   Tachypnea seems to be improving with the morphine treatment. The patient is not in any distress.  Objective: Vitals:   05/06/17 0906 05/07/17 0544 05/08/17 0509 05/09/17 0656  BP:  140/72 139/78 135/66  Pulse:  100 (!) 103 (!) 126  Resp:  (!) 26    Temp:  99.5 F (37.5 C) 99.5 F (37.5 C) (!) 100.4 F (38 C)  TempSrc:  Axillary Axillary Axillary  SpO2:  (!) 88% (!) 75% (!) 51%  Weight:      Height: 5\' 8"  (1.727 m)       Intake/Output Summary (Last 24 hours) at 05/09/2017 1133 Last data filed at 05/09/2017 0425 Gross per 24 hour  Intake 0 ml  Output 200 ml  Net -200 ml   Filed Weights   05/02/17 0417 05/03/17 0408 05/04/17 0453  Weight: 67.3 kg (148 lb 5.9 oz) 68 kg (149 lb 14.6 oz) 73.4 kg (161 lb 13.1 oz)    Examination:  General exam: Not in distress. Respiratory system: Clear to auscultation. Respiratory effort normal. Cardiovascular system: S1 & S2. No pedal edema. Gastrointestinal system: Abdomen is nondistended, soft and nontender.  Central nervous system: Patient is anoxic.Alert and oriented. No focal neurological deficits..  Data Reviewed: I have personally reviewed following labs and imaging studies  CBC: Recent Labs  Lab 05/03/17 0235  WBC 5.7  HGB 8.9*  HCT 25.6*  MCV 88.0  PLT 347*   Basic Metabolic Panel: Recent Labs  Lab 05/03/17 0235  NA 148*  K 5.4*  CL 116*  CO2 20*  GLUCOSE 144*  BUN 39*  CREATININE 1.75*  CALCIUM 8.0*   GFR: Estimated Creatinine Clearance: 34.7 mL/min (A) (by  C-G formula based on SCr of 1.75 mg/dL (H)). Liver Function Tests: No results for input(s): AST, ALT, ALKPHOS, BILITOT, PROT, ALBUMIN in the last 168 hours. No results for input(s): LIPASE, AMYLASE in the last 168 hours. No results for input(s): AMMONIA in the last 168 hours. Coagulation Profile: No results for input(s): INR, PROTIME in the last 168 hours. Cardiac Enzymes: No results for input(s): CKTOTAL, CKMB, CKMBINDEX, TROPONINI  in the last 168 hours. BNP (last 3 results) No results for input(s): PROBNP in the last 8760 hours. HbA1C: No results for input(s): HGBA1C in the last 72 hours. CBG: Recent Labs  Lab 05/03/17 0009 05/03/17 0348 05/03/17 0805 05/03/17 0928 05/03/17 1110  GLUCAP 119* 135* 142* 151* 147*   Lipid Profile: No results for input(s): CHOL, HDL, LDLCALC, TRIG, CHOLHDL, LDLDIRECT in the last 72 hours. Thyroid Function Tests: No results for input(s): TSH, T4TOTAL, FREET4, T3FREE, THYROIDAB in the last 72 hours. Anemia Panel: No results for input(s): VITAMINB12, FOLATE, FERRITIN, TIBC, IRON, RETICCTPCT in the last 72 hours. Urine analysis:    Component Value Date/Time   COLORURINE AMBER (A) 04/26/2017 2057   APPEARANCEUR CLOUDY (A) 04/16/2017 2057   LABSPEC 1.021 04/22/2017 2057   PHURINE 5.0 04/26/2017 2057   GLUCOSEU NEGATIVE 05/03/2017 2057   GLUCOSEU NEGATIVE 09/22/2014 0757   HGBUR MODERATE (A) 04/21/2017 2057   BILIRUBINUR NEGATIVE 04/13/2017 2057   BILIRUBINUR n 04/30/2011 1534   KETONESUR NEGATIVE 05/05/2017 2057   PROTEINUR 30 (A) 04/25/2017 2057   UROBILINOGEN 0.2 09/22/2014 0757   NITRITE NEGATIVE 04/29/2017 2057   LEUKOCYTESUR NEGATIVE 04/26/2017 2057   Sepsis Labs: @LABRCNTIP (procalcitonin:4,lacticidven:4)  )No results found for this or any previous visit (from the past 240 hour(s)).       Radiology Studies: No results found.      Scheduled Meds: Continuous Infusions: . sodium chloride    . morphine       LOS: 12 days    Time spent: 25 minutes.    Dana Allan, MD  [facility] Pager #: [phone] 7PM-7AM contact night coverage as above

## 2017-05-09 NOTE — Progress Notes (Signed)
Palliative Medicine RN Note: Rec'd call from Dr Marthenia Rolling requesting we see Mr Bishop, as family is no longer agreeing with hospice.  I arrived to see patient. His wife, children, and other extended family are at the bedside.   RR around 30 despite recent morphine. Feet are cool and cyanotic. He is completely non-responsive. Foley and rectal tube in place at time of my arrival.   I met with Mrs Kreiser and the family. They very much agree with comfort care and want aggressive symptom management with no measures to prolong life. They do want hospice, but they are concerned about whether he is stable to transfer. O2 sat documented as 51% this morning, and the family also got a call from nursing that his breathing was changing & that the nurse felt he was going to die in a few hours.   I discussed Mr Montour at length with PMT Medical Director, Dr Rhea Pink. She will see him this afternoon. In the interim, she ordered a continuous morphine infusion to address his respiratory distress. She is very concerned about trying to move Mr Dunaj and the risk of him dying in the ambulance during transport. She does not recommend moving him, as his respiratory status is unstable and the family is not willing to take the risk of death during transport.   I have paged Dr Marthenia Rolling with an update.  Marjie Skiff Micaiah Remillard, RN, BSN, [facility] Palliative Medicine Team 05/09/2017 11:04 AM Office [phone]

## 2017-05-09 NOTE — Progress Notes (Signed)
Patient is not stable for discharge and I agree with the details provided in the Palliative RN note. I anticipate death within the next 24 hours. Comfort and symptom management are the primary goals of care at this point.  Lane Hacker, DO Palliative Medicine

## 2017-05-09 NOTE — Social Work (Addendum)
[name] has a bed this morning, pt family has questions about his stability. MD paged and states he will speak with RN regarding stability for transfer. CSW has also checked in with unit director and RN charge nurse regarding decision to keep pt in hospital or transfer.   CSW continuing to follow to support pt and pt family with possible transfer.   11:00am- CSW spoke with palliative RN Threasa Beards and palliative care team would like pt to stay and do not feel he is appropriate for transfer. [facility] referral cancelled.   CSW signing off. Please consult if any additional needs arise.  [name], [place] Work [phone]

## 2017-05-09 NOTE — Progress Notes (Signed)
Palliative Medicine RN Note: Afternoon symptom check. Resp are still labored at about 22/min with some gasping. Obtained rx to increase basal rate of morphine to 3mg /hour. Discussed s/s imminent demise with wife as well as futility of O2 therapy at this point in the dying process; she verbalized understanding.  I will follow up tomorrow if the patient survives the night.  Marjie Skiff Trystin Terhune, RN, BSN, [facility] Palliative Medicine Team 05/09/2017 3:39 PM Office [phone]

## 2017-05-10 DIAGNOSIS — E1122 Type 2 diabetes mellitus with diabetic chronic kidney disease: Secondary | ICD-10-CM

## 2017-05-10 DIAGNOSIS — Z86718 Personal history of other venous thrombosis and embolism: Secondary | ICD-10-CM | POA: Diagnosis not present

## 2017-05-10 DIAGNOSIS — Z87891 Personal history of nicotine dependence: Secondary | ICD-10-CM

## 2017-05-10 DIAGNOSIS — R627 Adult failure to thrive: Secondary | ICD-10-CM | POA: Diagnosis not present

## 2017-05-10 DIAGNOSIS — Z978 Presence of other specified devices: Secondary | ICD-10-CM

## 2017-05-10 DIAGNOSIS — D631 Anemia in chronic kidney disease: Secondary | ICD-10-CM

## 2017-05-10 DIAGNOSIS — I129 Hypertensive chronic kidney disease with stage 1 through stage 4 chronic kidney disease, or unspecified chronic kidney disease: Secondary | ICD-10-CM | POA: Diagnosis not present

## 2017-05-10 DIAGNOSIS — N183 Chronic kidney disease, stage 3 (moderate): Secondary | ICD-10-CM | POA: Diagnosis not present

## 2017-05-10 DIAGNOSIS — J189 Pneumonia, unspecified organism: Secondary | ICD-10-CM

## 2017-05-10 DIAGNOSIS — Z7984 Long term (current) use of oral hypoglycemic drugs: Secondary | ICD-10-CM | POA: Diagnosis not present

## 2017-05-10 DIAGNOSIS — D649 Anemia, unspecified: Secondary | ICD-10-CM

## 2017-05-10 DEATH — deceased

## 2017-05-21 ENCOUNTER — Other Ambulatory Visit: Payer: Medicare Other

## 2017-05-23 ENCOUNTER — Ambulatory Visit: Payer: Medicare Other | Admitting: Endocrinology

## 2017-06-07 NOTE — Progress Notes (Signed)
Body transported to morgue.

## 2017-06-07 NOTE — Accreditation Note (Signed)
Patient is a DNR, comfort care, unresponsive, no pulse, no respirations, pooling of blood in feet noted, expired at 0800 today, next of kin was at bedside at time of death, MD made aware. [place] [facility] services was also notified.

## 2017-06-07 NOTE — Progress Notes (Deleted)
PROGRESS NOTE    [name]  TKZ:[id number] DOB: 1940/10/08 DOA: 05/08/2017 PCP: Lance Sell, NP  Outpatient Specialists:   Brief Narrative: Patient is a 77 year old African-American male with past medical history significant for prostate cancer, pneumonia, hypertension, hyperlipidemia, GERD, diabetes mellitus type 2, chronic kidney disease, cataract, anemia and DVTs.  Patient was admitted with unconsciousness/coma thought to be likely secondary to hypoglycemic coma.  The patient was initially admitted under the ICU team.  There was also concern for possible pneumonia.  Due to non-improvement, the patient's family have elected for patient to be discharged to a hospice place for comfort measures.    Assessment & Plan:   Active Problems:   Type 2 diabetes mellitus with renal manifestations (HCC)   Normocytic anemia   Hypoglycemia   CKD (chronic kidney disease) stage 3, GFR 30-59 ml/min (HCC)   Acute metabolic encephalopathy   Acute hypoxemic respiratory failure (HCC)   Leukopenia   Coma ([place])   Acute metabolic encephalopathy secondary to severe hypoglycemia. Patient is actively dying. Palliative care team has started the patient on morphine drip, to titrate to comfort. Patient remains encephalopathic. Patient is comfort measures only. Death is imminent at this point.  Diabetes mellitus. Patient developed severe hypoglycemia and went into coma. Comfort measures only.  Normocytic anemia -comfort care only.  History of prostatic cancer.  Hypertension.   DVT prophylaxis: None. Code Status: DO NOT RESUSCITATE Family Communication: Wife and 2 sons. Disposition Plan: Death is imminent.  Consultants:   Neurology.   Gastroenterology.   Palliative care team.  Procedures:  None Antimicrobials:   NOne   Subjective: 05/22/2017: The patient cannot give any history due to anoxic brain injury.   Patient appears comfortable.  Objective: Vitals:   05/08/17  0509 05/09/17 0656 05/22/2017 0544 2017-05-22 0548  BP: 139/78 135/66 (!) 85/55   Pulse: (!) 103 (!) 126 (!) 29 (!) 104  Resp:      Temp: 99.5 F (37.5 C) (!) 100.4 F (38 C) 99.1 F (37.3 C)   TempSrc: Axillary Axillary Axillary   SpO2: (!) 75% (!) 51% (!) 72%   Weight:      Height:        Intake/Output Summary (Last 24 hours) at 2017/05/22 0730 Last data filed at May 22, 2017 0543 Gross per 24 hour  Intake 66.2 ml  Output 50 ml  Net 16.2 ml   Filed Weights   05/02/17 0417 05/03/17 0408 05/04/17 0453  Weight: 67.3 kg (148 lb 5.9 oz) 68 kg (149 lb 14.6 oz) 73.4 kg (161 lb 13.1 oz)    Examination: 05/22/17: patient seen and examined  General exam: Not in distress. Respiratory system: Clear to auscultation. Respiratory effort normal. Cardiovascular system: S1 & S2. No pedal edema. Gastrointestinal system: Abdomen is nondistended, soft and nontender.  Central nervous system: Patient is anoxic.  Data Reviewed: I have personally reviewed following labs and imaging studies  CBC: No results for input(s): WBC, NEUTROABS, HGB, HCT, MCV, PLT in the last 168 hours. Basic Metabolic Panel: No results for input(s): NA, K, CL, CO2, GLUCOSE, BUN, CREATININE, CALCIUM, MG, PHOS in the last 168 hours. GFR: Estimated Creatinine Clearance: 34.7 mL/min (A) (by C-G formula based on SCr of 1.75 mg/dL (H)). Liver Function Tests: No results for input(s): AST, ALT, ALKPHOS, BILITOT, PROT, ALBUMIN in the last 168 hours. No results for input(s): LIPASE, AMYLASE in the last 168 hours. No results for input(s): AMMONIA in the last 168 hours. Coagulation Profile: No results for input(s):  INR, PROTIME in the last 168 hours. Cardiac Enzymes: No results for input(s): CKTOTAL, CKMB, CKMBINDEX, TROPONINI in the last 168 hours. BNP (last 3 results) No results for input(s): PROBNP in the last 8760 hours. HbA1C: No results for input(s): HGBA1C in the last 72 hours. CBG: Recent Labs  Lab 05/03/17 0805  05/03/17 0928 05/03/17 1110  GLUCAP 142* 151* 147*   Lipid Profile: No results for input(s): CHOL, HDL, LDLCALC, TRIG, CHOLHDL, LDLDIRECT in the last 72 hours. Thyroid Function Tests: No results for input(s): TSH, T4TOTAL, FREET4, T3FREE, THYROIDAB in the last 72 hours. Anemia Panel: No results for input(s): VITAMINB12, FOLATE, FERRITIN, TIBC, IRON, RETICCTPCT in the last 72 hours. Urine analysis:    Component Value Date/Time   COLORURINE AMBER (A) 05/07/2017 2057   APPEARANCEUR CLOUDY (A) 04/20/2017 2057   LABSPEC 1.021 05/06/2017 2057   PHURINE 5.0 04/17/2017 2057   GLUCOSEU NEGATIVE 04/19/2017 2057   GLUCOSEU NEGATIVE 09/22/2014 0757   HGBUR MODERATE (A) 04/20/2017 2057   BILIRUBINUR NEGATIVE 04/24/2017 2057   BILIRUBINUR n 04/30/2011 1534   KETONESUR NEGATIVE 04/20/2017 2057   PROTEINUR 30 (A) 04/23/2017 2057   UROBILINOGEN 0.2 09/22/2014 0757   NITRITE NEGATIVE 04/21/2017 2057   LEUKOCYTESUR NEGATIVE 04/13/2017 2057   Sepsis Labs: @LABRCNTIP (procalcitonin:4,lacticidven:4)  )No results found for this or any previous visit (from the past 240 hour(s)).       Radiology Studies: No results found.      Scheduled Meds: Continuous Infusions: . sodium chloride 1 mL (05/09/17 1135)  . morphine 3 mg/hr (05/09/17 1548)     LOS: 13 days    Time spent: 25 minutes.    Dana Allan, MD  [facility] Pager #: [phone] 7PM-7AM contact night coverage as above

## 2017-06-07 NOTE — Care Management Important Message (Signed)
Important Message  Patient Details  Name: [name] MRN: [id number] Date of Birth: 26-Dec-1940   Medicare Important Message Given:  Yes    Carles Collet, RN 05-13-17, 10:45 AM

## 2017-06-07 NOTE — Death Summary Note (Signed)
Death Summary  [name] EQA:[id number] DOB: 06/26/1940 DOA: 05-05-2017  PCP: Lance Sell, NP  Admit date: May 05, 2017 Date of Death: May 18, 2017 Time of Death: 0800 Notification: Lance Sell, NP notified of death of 18-May-2017   History of present illness:  [name] is a 77 y.o. male with past medical history significant for prostate cancer, pneumonia, hypertension, hyperlipidemia, GERD, diabetes mellitus type 2, chronic kidney disease, cataract, anemia and DVTs.  Patient was admitted with unconsciousness/coma thought to be likely secondary to hypoglycemic coma.  The patient was initially admitted under the ICU team. There was also concern for possible pneumonia.  Due to non-improvement, the patient's family have elected for patient to be discharged to a hospice place for comfort measures. Palliative care followed, started the patient on morphine drip to titrate for comfort.    The patient expired on 05/18/2017 at 0800 with his son at his bedside. He was peaceful and in no distress.  Final Diagnoses:  1.   Cardiopulmonary arrest post comfort care only 2.   Unconsciousness likely 2/2 to hypoglycemic coma   The results of significant diagnostics from this hospitalization (including imaging, microbiology, ancillary and laboratory) are listed below for reference.    Significant Diagnostic Studies: Dg Chest 2 View  Result Date: 04/18/2017 CLINICAL DATA:  Tachycardic and failure to thrive.  Achalasia. EXAM: CHEST  2 VIEW COMPARISON:  01/18/2017 and CT 01/23/2017 FINDINGS: Lungs are adequately inflated without focal consolidation or effusion. Possible nodule opacity in the retrosternal area. Cardiomediastinal silhouette and remainder of the exam is unchanged. IMPRESSION: No acute cardiopulmonary disease. Possible nodular density in the retrosternal region. Recommend follow-up PA and lateral chest radiograph versus noncontrast chest CT on elective basis for further  evaluation. Electronically Signed   By: Marin Olp M.D.   On: 04/18/2017 19:40   Ct Head Wo Contrast  Result Date: 05/05/2017 CLINICAL DATA:  Nonresponsive patient. EXAM: CT HEAD WITHOUT CONTRAST TECHNIQUE: Contiguous axial images were obtained from the base of the skull through the vertex without intravenous contrast. COMPARISON:  01/28/2016 FINDINGS: Brain: No evidence of acute infarction, hemorrhage, hydrocephalus, extra-axial collection or mass lesion/mass effect. Mild brain parenchymal volume loss and deep white matter microangiopathy. Vascular: Calcific atherosclerotic disease of the intracavernous carotid artery. Skull: Normal. Negative for fracture or focal lesion. Sinuses/Orbits: No acute finding. Other: None. IMPRESSION: No acute intracranial abnormality. Mild parenchymal atrophy and moderate chronic microvascular disease. Electronically Signed   By: Fidela Salisbury M.D.   On: 05-May-2017 13:23   Mr Brain Wo Contrast  Result Date: 05/01/2017 CLINICAL DATA:  Altered mental status. Initially found unresponsive today. EXAM: MRI HEAD WITHOUT CONTRAST TECHNIQUE: Multiplanar, multiecho pulse sequences of the brain and surrounding structures were obtained without intravenous contrast. COMPARISON:  CT HEAD May 05, 2017 FINDINGS: INTRACRANIAL CONTENTS: No reduced diffusion to suggest acute ischemia. A few scattered chronic micro hemorrhages associated with chronic hypertension. Moderate parenchymal brain volume loss without hydrocephalus. Symmetric FLAIR T2 bright signal bilateral putamen and to lesser extent caudate head without T1 prolongation. Patchy supratentorial white matter FLAIR T2 hyperintensities. No midline shift, mass effect or masses. No abnormal extra-axial fluid collections. Normal symmetric size, morphology and signal of the hippocampi VASCULAR: Normal major intracranial vascular flow voids present at skull base. SKULL AND UPPER CERVICAL SPINE: No abnormal sellar expansion. No  suspicious calvarial bone marrow signal. Craniocervical junction maintained. SINUSES/ORBITS: Trace paranasal sinus mucosal thickening. Trace RIGHT mastoid effusion. Dystrophic LEFT ocular lens, asymmetrically smaller. OTHER: Life-support lines in place. IMPRESSION:  1. Mild symmetric basal ganglia signal abnormality associated with toxic insult, metabolic disturbance such as hyperammonemia, hypoglycemia. 2. Moderate parenchymal brain volume loss and mild chronic small vessel ischemic disease. Electronically Signed   By: Elon Alas M.D.   On: 05/01/2017 15:52   Dg Chest Port 1 View  Result Date: 05/02/2017 CLINICAL DATA:  Endotracheal placement. EXAM: PORTABLE CHEST 1 VIEW COMPARISON:  05/01/2017 FINDINGS: Endotracheal tube tip remains 4 cm above the carina. Nasogastric 2 is again looped in the dilated distal esophagus, not extending below the diaphragm. Bilateral lower lobe pneumonia and volume loss persists, left worse than right. No significant change. Upper lungs remain clear. IMPRESSION: Endotracheal tube well positioned. Nasogastric tube again coiled in a dilated distal esophagus, not extending below the diaphragm. Persistent bilateral lower lobe pneumonia and volume loss left worse than right. Electronically Signed   By: Nelson Chimes M.D.   On: 05/02/2017 06:30   Dg Chest Port 1 View  Result Date: 05/01/2017 CLINICAL DATA:  Respiratory failure EXAM: PORTABLE CHEST 1 VIEW COMPARISON:  04/30/2017 FINDINGS: Cardiac shadow is stable. Persistent left retrocardiac density is noted related to dilated esophagus. Nasogastric catheter stable in appearance coiled in what appears to be a dilated esophagus based on previous cross-sectional imaging. Endotracheal tube is noted in satisfactory position. Bibasilar infiltrative changes are again noted. No acute bony abnormality is seen. IMPRESSION: Stable bibasilar infiltrates. The remainder of the exam is unchanged. Electronically Signed   By: Inez Catalina M.D.    On: 05/01/2017 07:03   Dg Chest Port 1 View  Result Date: 04/30/2017 CLINICAL DATA:  Respiratory failure.  Achalasia EXAM: PORTABLE CHEST 1 VIEW COMPARISON:  04/28/2017 FINDINGS: Endotracheal tube in good position. NG coiled in very dilated distal esophagus Bibasilar airspace disease with mild progression. Small bilateral effusions. IMPRESSION: Endotracheal tube in good position. NG coiled in the dilated esophagus Bibasilar airspace disease with mild progression, possible pneumonia Electronically Signed   By: Franchot Gallo M.D.   On: 04/30/2017 06:43   Dg Chest Port 1 View  Result Date: 04/28/2017 CLINICAL DATA:  77 year old male who was unable to be woken for breakfast on the day of admission. Remains unresponsive. EXAM: PORTABLE CHEST 1 VIEW COMPARISON:  05/02/2017 and earlier. FINDINGS: Portable AP semi upright view at 0658 hours. Stable endotracheal tube tip between the level the clavicles and carina. The enteric tube has a tortuous course through the mediastinum, and the patient is noted to have chronically dilated thoracic esophagus due to achalasia. The enteric tube tip is likely just inside the stomach. The side hole is in the distal thoracic esophagus. Increased retrocardiac opacity. Streaky right greater than left infrahilar opacity appears mildly diminished. No pneumothorax or pulmonary edema. No pleural effusion. Mediastinal contours remain normal. IMPRESSION: 1. ET tube in good position. The enteric tube takes a tortuous course through the patient's dilated thoracic esophagus. The tip is likely just inside the stomach and the side hole is in the distal thoracic esophagus. 2. Streaky infrahilar opacity persists, but appears mildly regressed. Consider aspiration or pneumonia. 3. Recurrent left lower lobe atelectasis/collapse. Electronically Signed   By: Genevie Ann M.D.   On: 04/28/2017 09:14   Dg Chest Portable 1 View  Result Date: 05/06/2017 CLINICAL DATA:  Hypoxia EXAM: PORTABLE CHEST 1 VIEW  COMPARISON:  Study obtained earlier in the day FINDINGS: Endotracheal tube tip is 3.6 cm above the carina. There is an apparent chest tube on the left. No pneumothorax. There has been resolution of consolidation from the  left base medially. There is patchy airspace opacity throughout the right mid and lower lung zones. Heart size normal. Pulmonary vascularity normal. No adenopathy. IMPRESSION: Endotracheal tube tip 3.6 cm above carina. No pneumothorax. There is now hazy opacity throughout much of the right mid and lower lung zones. Question developing pneumonia or atypical edema. There has been interval clearing of presumed atelectasis from the medial left base. Heart size normal. Electronically Signed   By: Lowella Grip III M.D.   On: 05/07/2017 11:38   Dg Chest Port 1 View  Result Date: 04/29/2017 CLINICAL DATA:  Altered mental status, history of achalasia EXAM: PORTABLE CHEST 1 VIEW COMPARISON:  04/18/2017 FINDINGS: Mild increased vascular congestion and perihilar interstitial prominence suggesting mild volume overload or a developing edema. Stable heart size. No large effusion. No focal pneumonia, collapse or consolidation. Trachea is midline. Aorta is atherosclerotic. Bones are osteopenic. IMPRESSION: Increased central vascular congestion and interstitial prominence suspicious for volume overload or mild edema. Electronically Signed   By: Jerilynn Mages.  Shick M.D.   On: 04/14/2017 10:22   Dg Esophagus Dilation  Result Date: 04/22/2017 ESOPHAGEAL DILATATION: Fluoroscopy was provided for use by the requesting physician.  No images were obtained for radiographic interpretation.  Dg Esophagus W/water Sol Cm  Result Date: 04/22/2017 CLINICAL DATA:  Achalasia, status post balloon dilatation of the esophagus, evaluate for leak EXAM: ESOPHOGRAM/BARIUM SWALLOW TECHNIQUE: Single contrast examination was performed using  water soluble. FLUOROSCOPY TIME:  Fluoroscopy Time:  1.6 minutes Radiation Exposure Index (if  provided by the fluoroscopic device): 14.8 mGy Number of Acquired Spot Images: 7 COMPARISON:  Esophagram dated 01/24/2017 FINDINGS: Dilated mid/distal esophagus, compatible with the history achalasia. Minimal contrast passed into the stomach after 5 minutes, suggesting a residual lower esophageal narrowing/stricture. No evidence of leak. IMPRESSION: No evidence of leak. Achalasia with suspected residual lower esophageal narrowing/stricture. Electronically Signed   By: Julian Hy M.D.   On: 04/22/2017 15:20    Microbiology: No results found for this or any previous visit (from the past 240 hour(s)).   Labs: Basic Metabolic Panel: No results for input(s): NA, K, CL, CO2, GLUCOSE, BUN, CREATININE, CALCIUM, MG, PHOS in the last 168 hours. Liver Function Tests: No results for input(s): AST, ALT, ALKPHOS, BILITOT, PROT, ALBUMIN in the last 168 hours. No results for input(s): LIPASE, AMYLASE in the last 168 hours. No results for input(s): AMMONIA in the last 168 hours. CBC: No results for input(s): WBC, NEUTROABS, HGB, HCT, MCV, PLT in the last 168 hours. Cardiac Enzymes: No results for input(s): CKTOTAL, CKMB, CKMBINDEX, TROPONINI in the last 168 hours. D-Dimer No results for input(s): DDIMER in the last 72 hours. BNP: Invalid input(s): POCBNP CBG: Recent Labs  Lab 05/03/17 0928 05/03/17 1110  GLUCAP 151* 147*   Anemia work up No results for input(s): VITAMINB12, FOLATE, FERRITIN, TIBC, IRON, RETICCTPCT in the last 72 hours. Urinalysis    Component Value Date/Time   COLORURINE AMBER (A) 05/03/2017 2057   APPEARANCEUR CLOUDY (A) 04/23/2017 2057   LABSPEC 1.021 04/13/2017 2057   PHURINE 5.0 04/16/2017 2057   GLUCOSEU NEGATIVE 05/05/2017 2057   GLUCOSEU NEGATIVE 09/22/2014 0757   HGBUR MODERATE (A) 04/11/2017 2057   BILIRUBINUR NEGATIVE 04/20/2017 2057   BILIRUBINUR n 04/30/2011 1534   KETONESUR NEGATIVE 04/17/2017 2057   PROTEINUR 30 (A) 04/18/2017 2057   UROBILINOGEN 0.2  09/22/2014 0757   NITRITE NEGATIVE 04/09/2017 2057   LEUKOCYTESUR NEGATIVE 04/11/2017 2057   Sepsis Labs Invalid input(s): PROCALCITONIN,  WBC,  LACTICIDVEN  SIGNED:  Kayleen Memos, MD  [facility] May 21, 2017, 8:37 AM Pager   If 7PM-7AM, please contact night-coverage [email or web address] Password [email or web address]

## 2017-06-07 DEATH — deceased

## 2017-06-19 ENCOUNTER — Ambulatory Visit: Payer: Medicare Other | Admitting: Gastroenterology

## 2018-03-11 IMAGING — CR DG CHEST 2V
2 series · 2 of 2 positions shown · non-contrast
Comparison: 01/09/2016 and 08/20/2010

CLINICAL DATA: Weakness with left neck pain and right shoulder
pain.

EXAM:
CHEST  2 VIEW

[w chest lat]
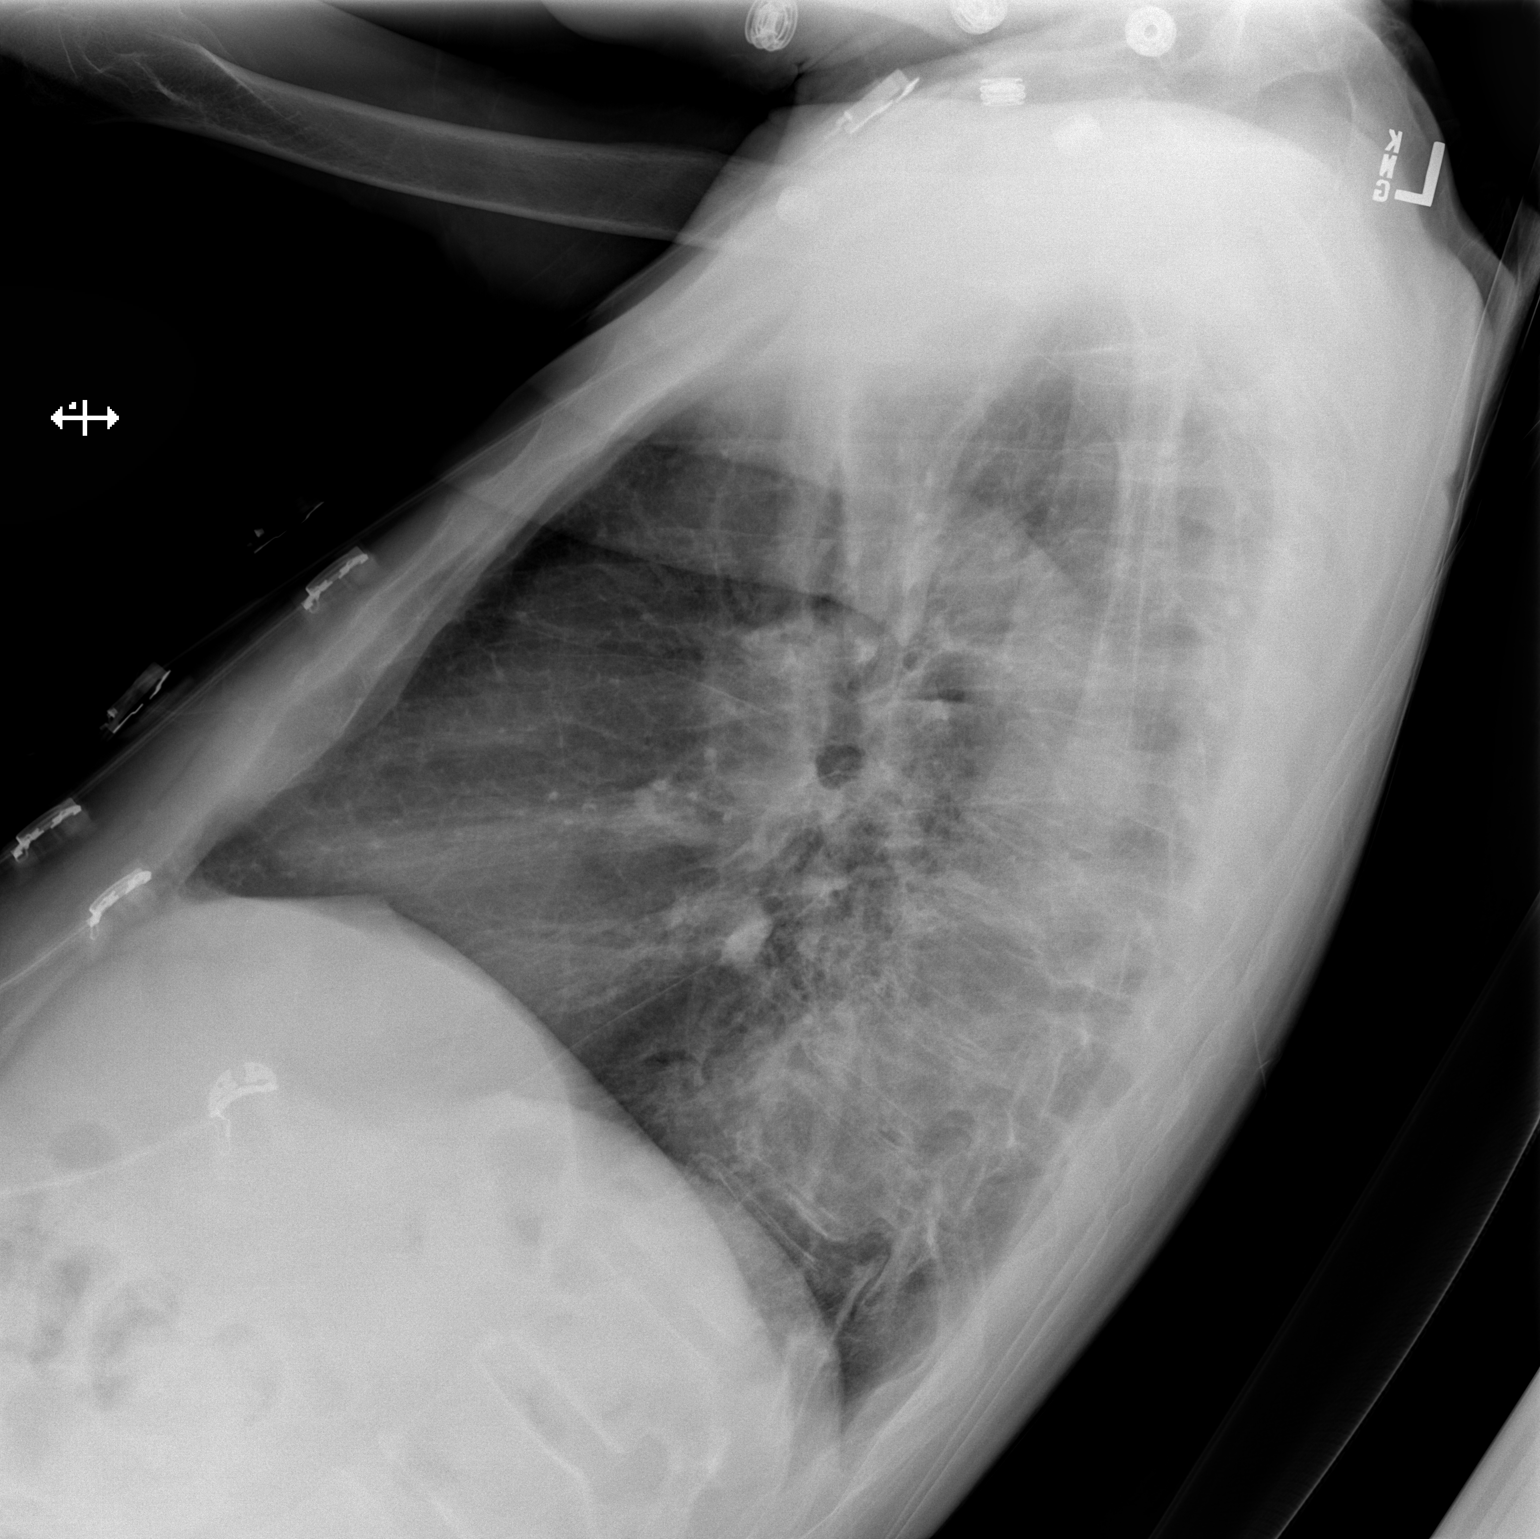

[x chest ap]
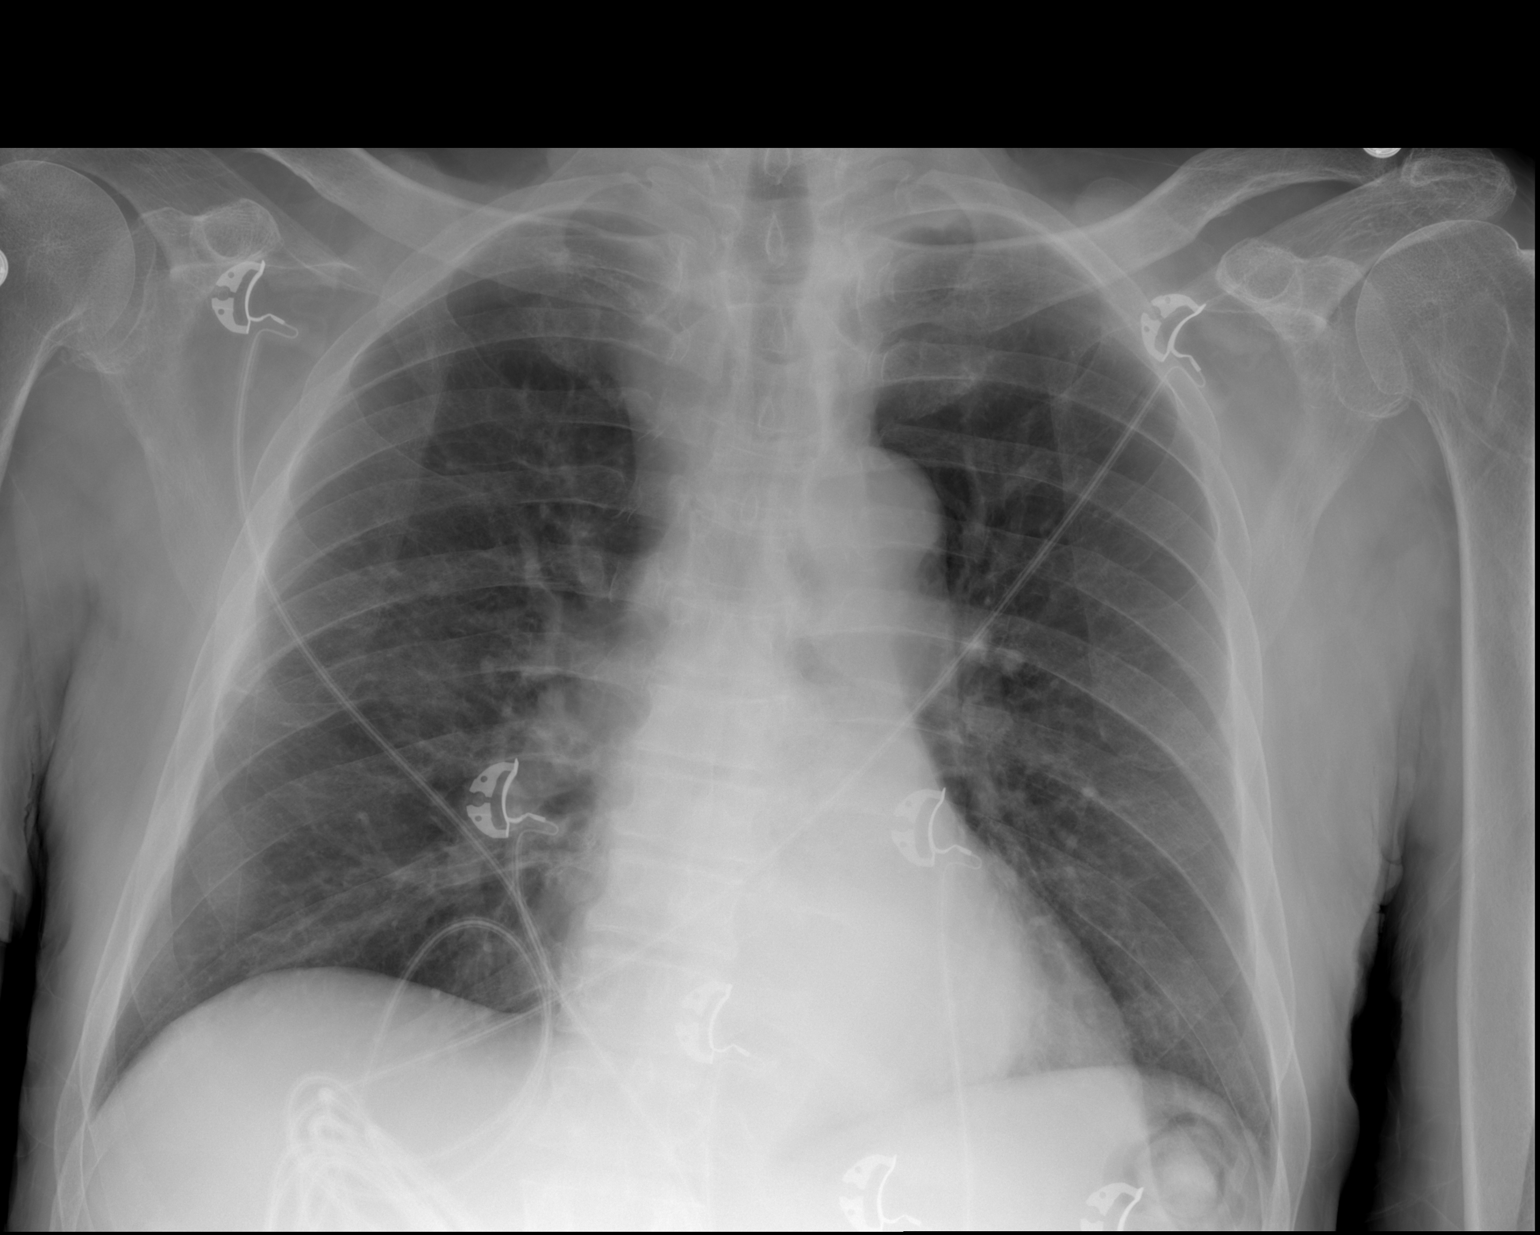

[2 of 2 positions shown; findings below may reference images not displayed]

FINDINGS: Lungs are adequately inflated with hazy opacification over the
posterior mid to lower lungs on the lateral film which may be
retrocardiac on the frontal film. No evidence of effusion.
Cardiomediastinal silhouette is within normal. There is mild
degenerate change of the spine.
IMPRESSION: Hazy opacification over the posterior mid to lower lungs on the
lateral film possibly within the left lower lobe and may be due to
pneumonia. Recommend follow-up chest radiograph in 3-4 weeks to
document resolution.

## 2018-03-13 IMAGING — DX DG FOOT COMPLETE 3+V*R*
3 series · 3 of 3 positions shown · non-contrast
Comparison: None.

CLINICAL DATA: 74-year-old male with generalize right-sided foot
pain. No history of injury. Swelling in the foot.

EXAM:
RIGHT FOOT COMPLETE - 3+ VIEW

[foot ap]
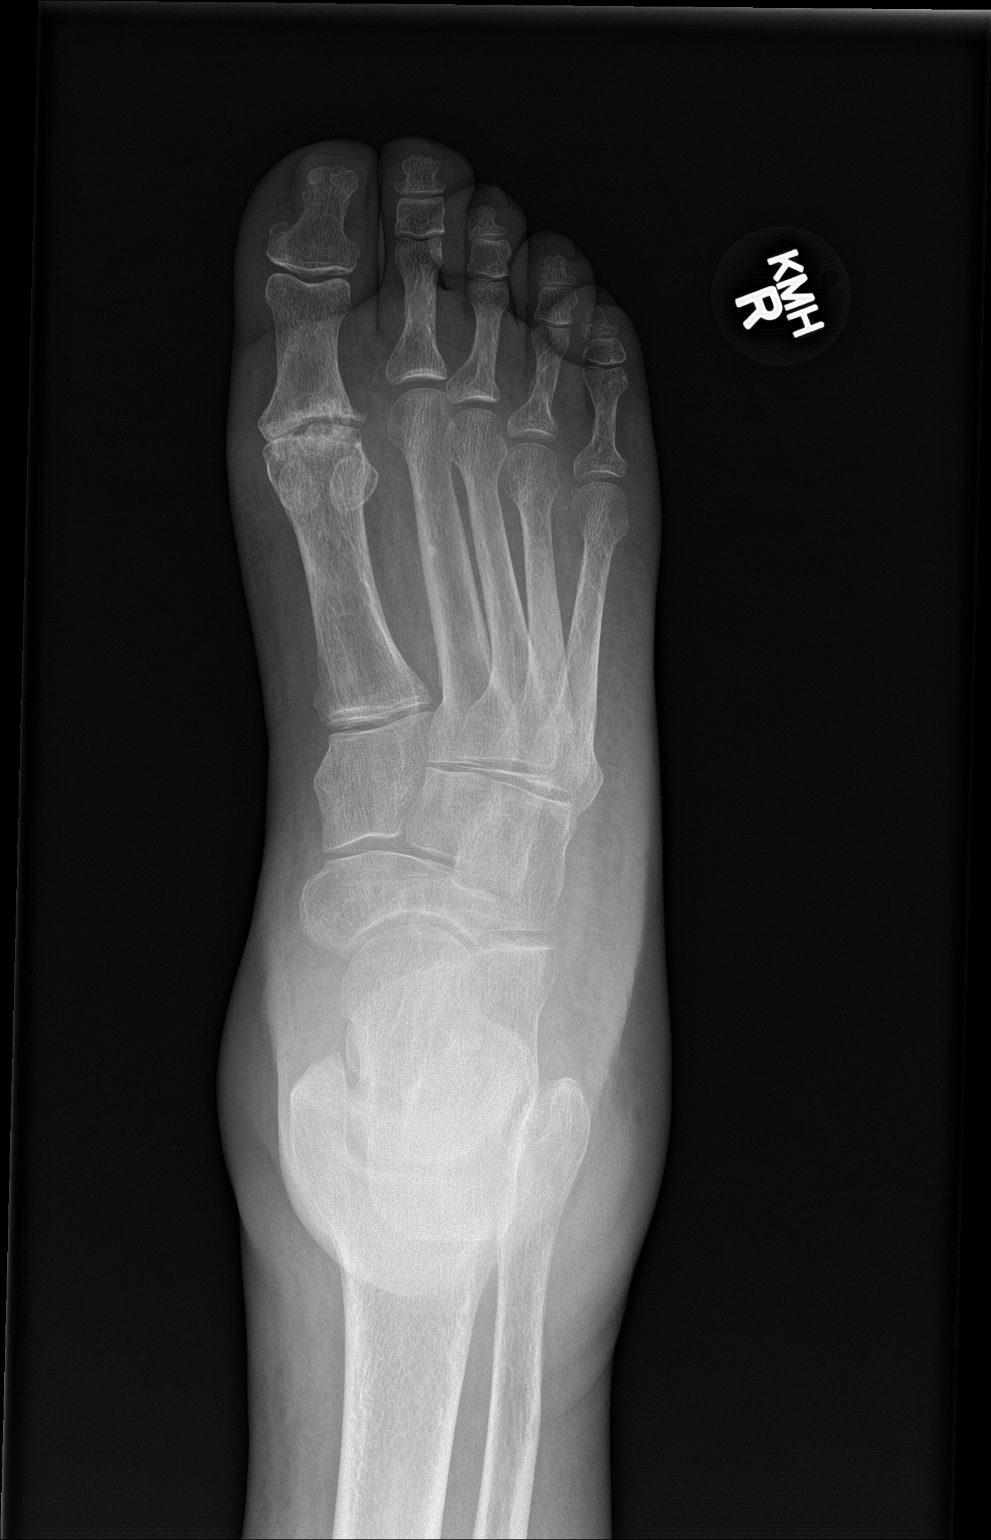

[foot obl]
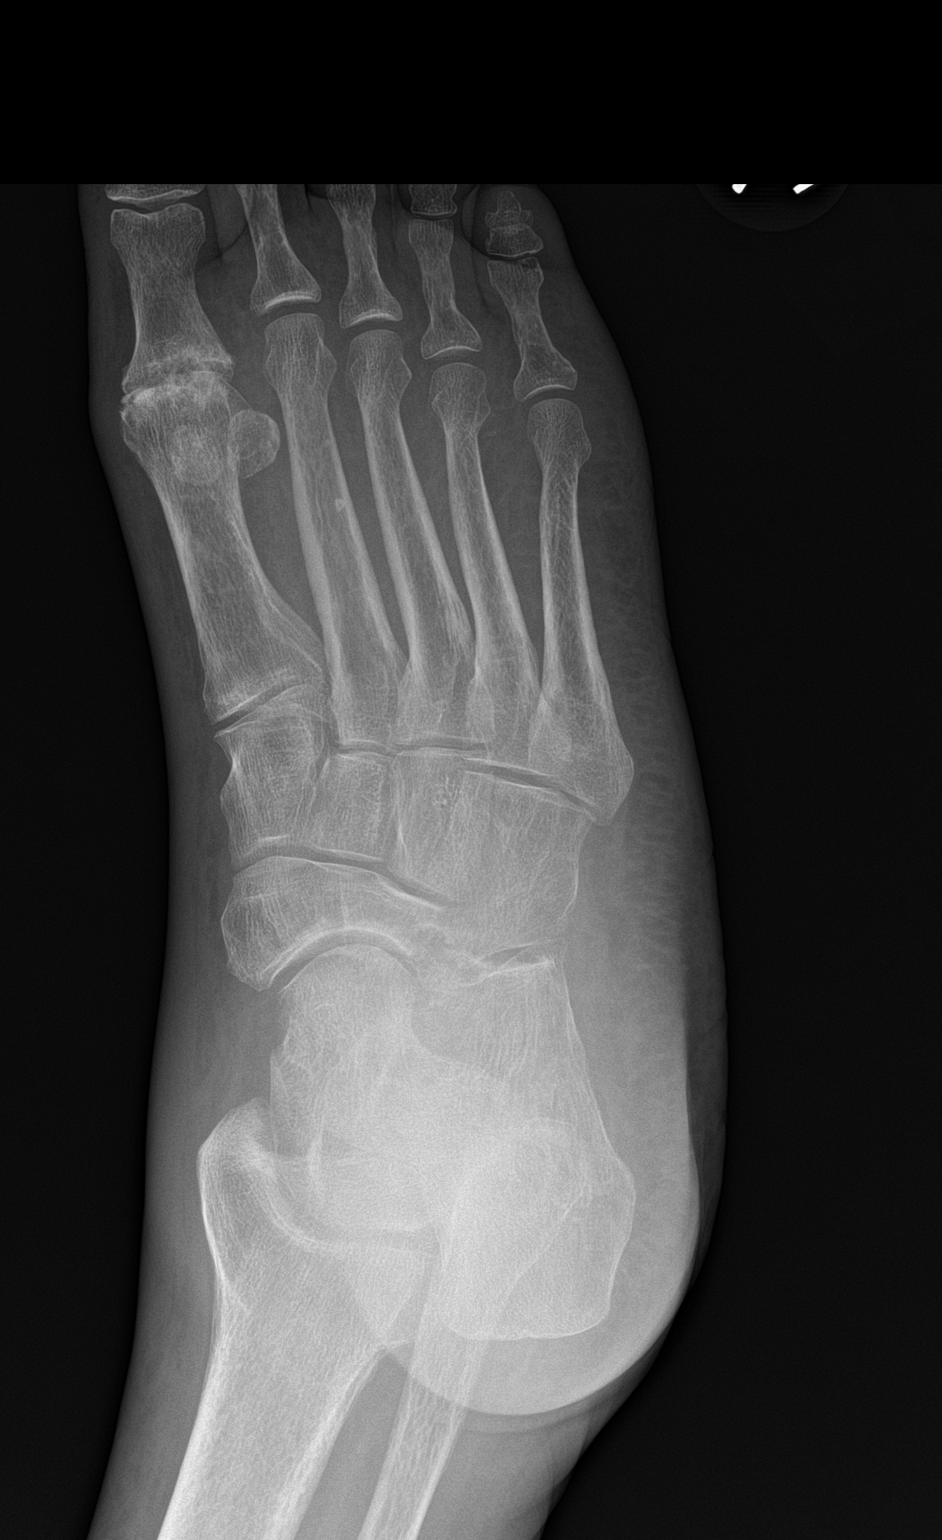

[foot lat]
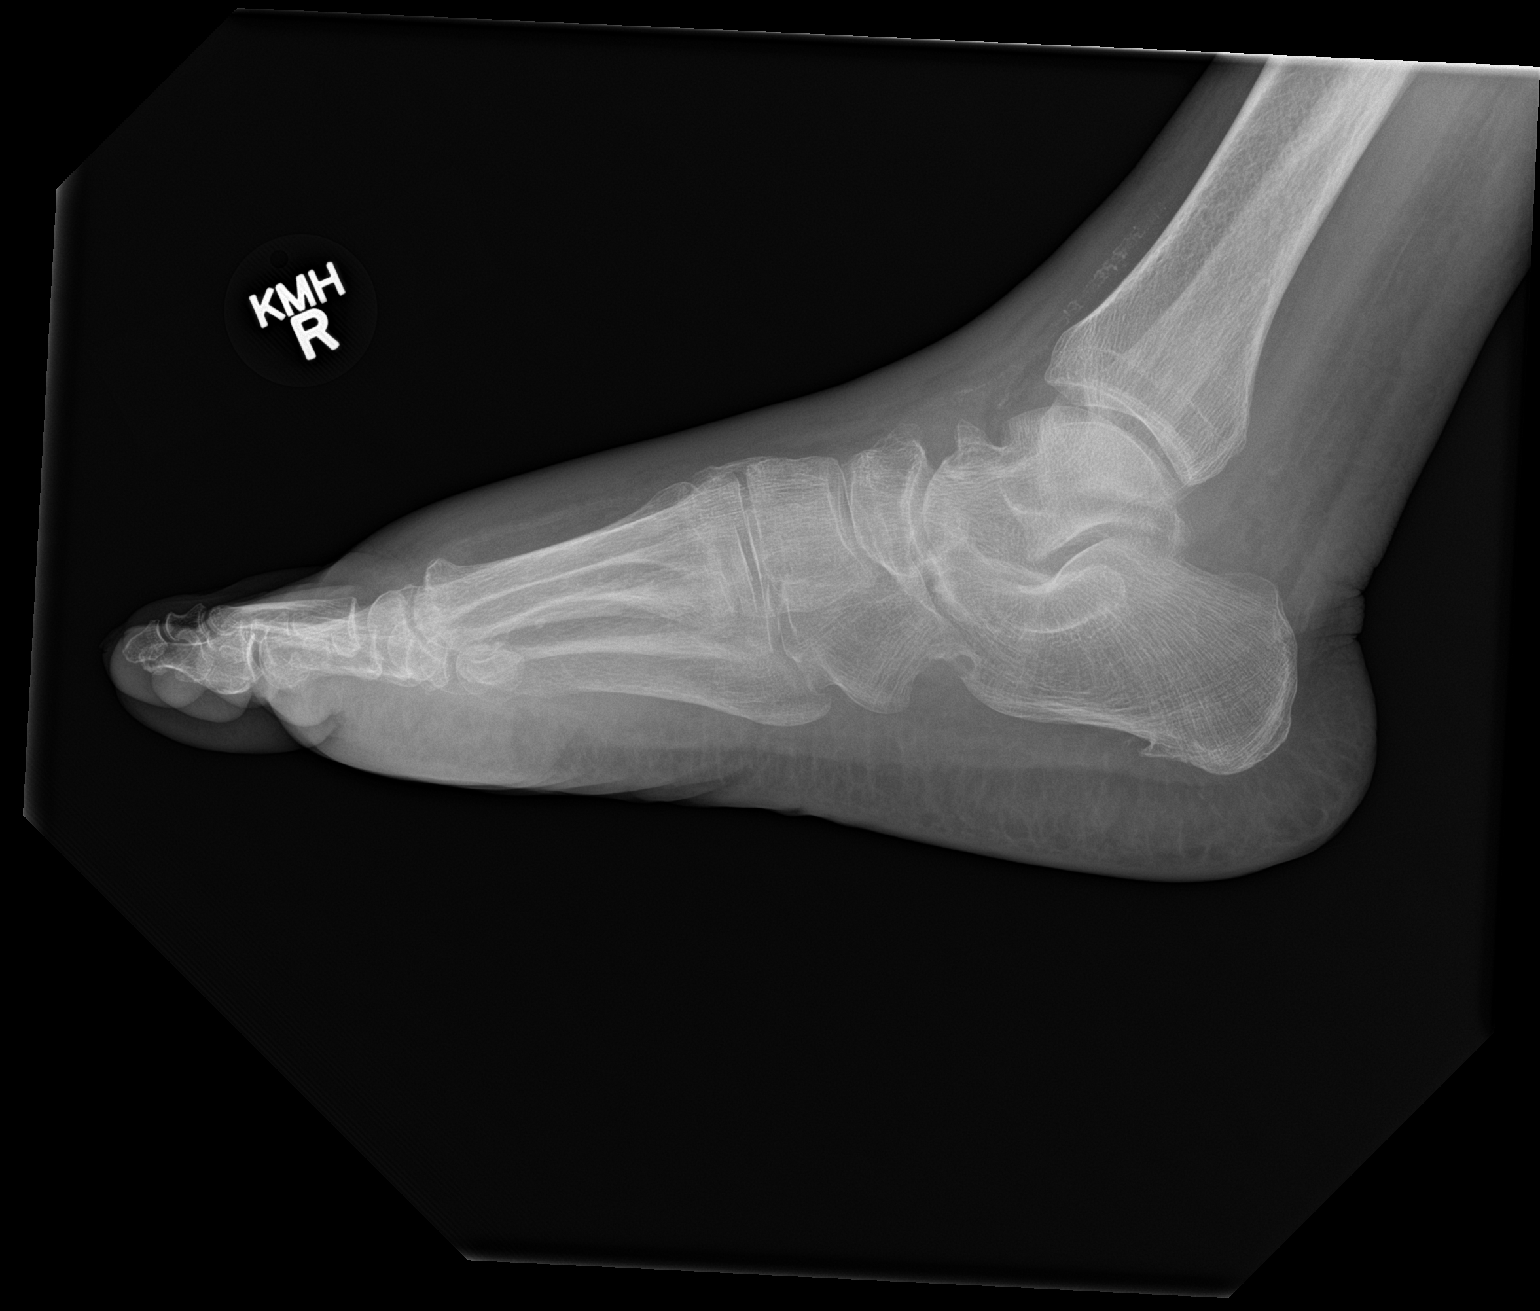

[3 of 3 positions shown; findings below may reference images not displayed]

FINDINGS: No acute displaced fracture, subluxation or dislocation. There is
advanced joint space narrowing, subchondral sclerosis, subchondral
cyst formation and osteophyte formation at the first MTP joint,
compatible with severe osteoarthritis. Small plantar calcaneal spur
incidentally noted.
IMPRESSION: 1. No acute radiographic abnormality of the right foot.
2. Advanced degenerative changes of osteoarthritis at the first MTP
joint.

## 2018-03-16 IMAGING — DX DG CHEST 1V PORT
1 series · 1 of 1 positions shown · non-contrast
Comparison: 01/28/2016

CLINICAL DATA: Follow-up pneumonia

EXAM:
PORTABLE CHEST 1 VIEW

[chest ap]
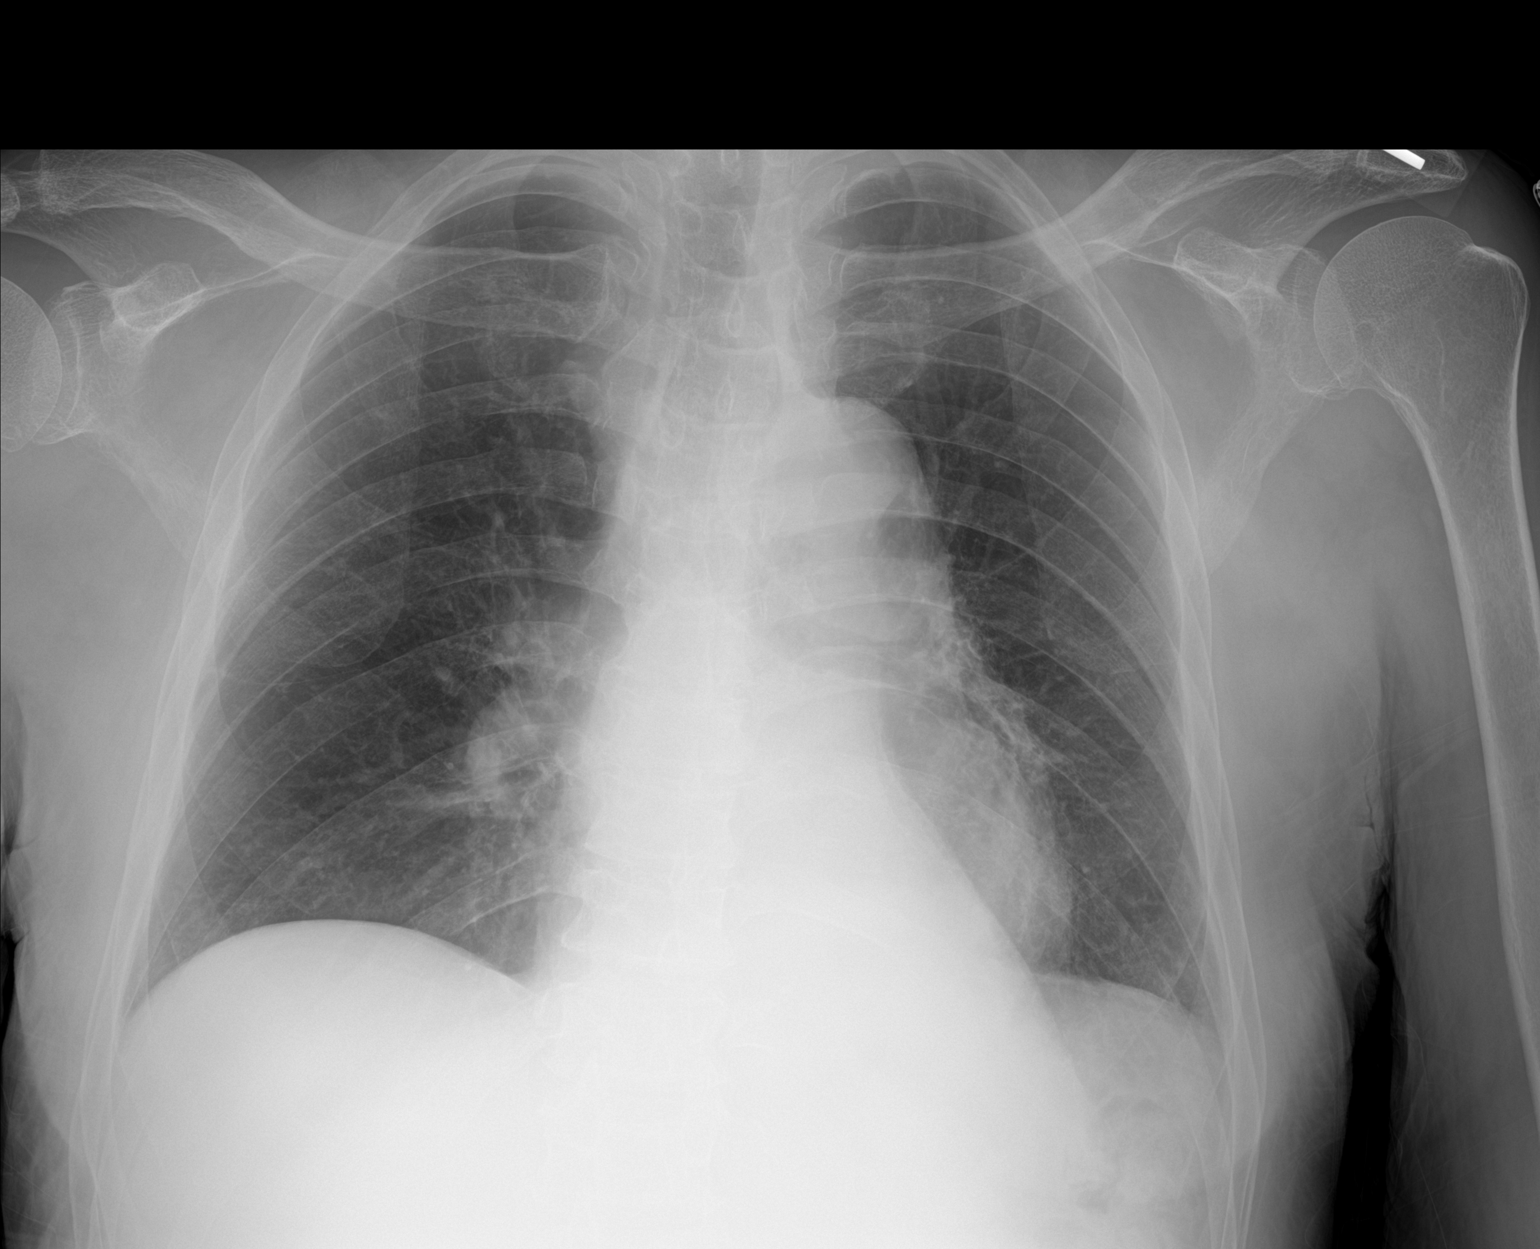

[1 of 1 positions shown; findings below may reference images not displayed]

FINDINGS: Cardiomediastinal silhouette is stable. Large hiatal hernia left
lower lobe retrocardiac again noted. No infiltrate or pulmonary
edema. Tortuous descending aorta.
IMPRESSION: Large hiatal hernia again noted.  No infiltrate or pulmonary edema.
